# Patient Record
Sex: Male | Born: 1960
Health system: Southern US, Community
[De-identification: ages and names within clinical notes are randomized; demographics above are authoritative.]

## PROBLEM LIST (undated history)

## (undated) DIAGNOSIS — K219 Gastro-esophageal reflux disease without esophagitis: Secondary | ICD-10-CM

## (undated) DIAGNOSIS — F419 Anxiety disorder, unspecified: Secondary | ICD-10-CM

## (undated) DIAGNOSIS — F32A Depression, unspecified: Secondary | ICD-10-CM

## (undated) DIAGNOSIS — E119 Type 2 diabetes mellitus without complications: Secondary | ICD-10-CM

## (undated) DIAGNOSIS — T6701XA Heatstroke and sunstroke, initial encounter: Secondary | ICD-10-CM

## (undated) DIAGNOSIS — K7460 Unspecified cirrhosis of liver: Secondary | ICD-10-CM

## (undated) DIAGNOSIS — I10 Essential (primary) hypertension: Secondary | ICD-10-CM

## (undated) HISTORY — PX: HERNIA REPAIR: SHX51

## (undated) HISTORY — DX: Type 2 diabetes mellitus without complications: E11.9

---

## 2011-03-28 ENCOUNTER — Emergency Department (HOSPITAL_COMMUNITY)
Admission: EM | Admit: 2011-03-28 | Discharge: 2011-03-28 | Disposition: A | Discharge status: Home / Self Care | Payer: Worker's Compensation | Attending: Emergency Medicine | Admitting: Emergency Medicine

## 2011-03-28 DIAGNOSIS — X58XXXA Exposure to other specified factors, initial encounter: Secondary | ICD-10-CM | POA: Insufficient documentation

## 2011-03-28 DIAGNOSIS — S058X9A Other injuries of unspecified eye and orbit, initial encounter: Secondary | ICD-10-CM | POA: Insufficient documentation

## 2011-03-28 DIAGNOSIS — H5710 Ocular pain, unspecified eye: Secondary | ICD-10-CM | POA: Insufficient documentation

## 2011-03-28 DIAGNOSIS — H11419 Vascular abnormalities of conjunctiva, unspecified eye: Secondary | ICD-10-CM | POA: Insufficient documentation

## 2011-03-28 DIAGNOSIS — H53149 Visual discomfort, unspecified: Secondary | ICD-10-CM | POA: Insufficient documentation

## 2011-03-28 DIAGNOSIS — H5789 Other specified disorders of eye and adnexa: Secondary | ICD-10-CM | POA: Insufficient documentation

## 2011-04-26 ENCOUNTER — Encounter: Payer: Self-pay | Admitting: Internal Medicine

## 2012-02-12 ENCOUNTER — Emergency Department (HOSPITAL_COMMUNITY): Payer: 59

## 2012-02-12 ENCOUNTER — Emergency Department (HOSPITAL_COMMUNITY)
Admission: EM | Admit: 2012-02-12 | Discharge: 2012-02-12 | Disposition: A | Discharge status: Home / Self Care | Payer: 59 | Attending: Emergency Medicine | Admitting: Emergency Medicine

## 2012-02-12 ENCOUNTER — Encounter (HOSPITAL_COMMUNITY): Payer: Self-pay

## 2012-02-12 DIAGNOSIS — T675XXA Heat exhaustion, unspecified, initial encounter: Secondary | ICD-10-CM

## 2012-02-12 DIAGNOSIS — R079 Chest pain, unspecified: Secondary | ICD-10-CM | POA: Insufficient documentation

## 2012-02-12 DIAGNOSIS — X30XXXA Exposure to excessive natural heat, initial encounter: Secondary | ICD-10-CM | POA: Insufficient documentation

## 2012-02-12 LAB — CBC WITH DIFFERENTIAL/PLATELET
Basophils Absolute: 0 10*3/uL (ref 0.0–0.1)
Basophils Relative: 0 % (ref 0–1)
Eosinophils Absolute: 0.1 10*3/uL (ref 0.0–0.7)
Eosinophils Relative: 1 % (ref 0–5)
HCT: 39.7 % (ref 39.0–52.0)
Hemoglobin: 14 g/dL (ref 13.0–17.0)
Lymphocytes Relative: 30 % (ref 12–46)
Lymphs Abs: 1.7 10*3/uL (ref 0.7–4.0)
MCH: 35.4 pg — ABNORMAL HIGH (ref 26.0–34.0)
MCHC: 35.3 g/dL (ref 30.0–36.0)
MCV: 100.5 fL — ABNORMAL HIGH (ref 78.0–100.0)
Monocytes Absolute: 0.5 10*3/uL (ref 0.1–1.0)
Monocytes Relative: 9 % (ref 3–12)
Neutro Abs: 3.5 10*3/uL (ref 1.7–7.7)
Neutrophils Relative %: 60 % (ref 43–77)
Platelets: 76 10*3/uL — ABNORMAL LOW (ref 150–400)
RBC: 3.95 MIL/uL — ABNORMAL LOW (ref 4.22–5.81)
RDW: 13 % (ref 11.5–15.5)
WBC: 5.9 10*3/uL (ref 4.0–10.5)

## 2012-02-12 LAB — BASIC METABOLIC PANEL
BUN: 7 mg/dL (ref 6–23)
Calcium: 9.2 mg/dL (ref 8.4–10.5)
GFR calc Af Amer: >90 mL/min (ref >90)
GFR calc non Af Amer: >90 mL/min (ref >90)
Potassium: 3.3 mEq/L — ABNORMAL LOW (ref 3.5–5.1)

## 2012-02-12 LAB — CK: Total CK: 154 U/L (ref 7–232)

## 2012-02-12 MED ORDER — SODIUM CHLORIDE 0.9 % IV BOLUS (SEPSIS)
1000.0000 mL | Freq: Once | INTRAVENOUS | Status: AC
  Administered 2012-02-12: 1000 mL via INTRAVENOUS

## 2012-02-12 MED ORDER — SODIUM CHLORIDE 0.9 % IV SOLN: Freq: Once | INTRAVENOUS | Status: DC

## 2012-02-12 NOTE — ED Notes (Signed)
Patient is comfortable at this time would like something to drink. MD notified.

## 2012-02-12 NOTE — ED Notes (Signed)
RN at bedside

## 2012-02-12 NOTE — ED Notes (Signed)
Patient states he is feeling better and ready to go home

## 2012-02-12 NOTE — ED Notes (Signed)
Patient was given water per MD approval.

## 2012-02-12 NOTE — ED Provider Notes (Signed)
History   This chart was scribed for Joya Gaskins, MD by Gerlean Ren. This patient was seen in room [facility]10/[facility]10 and the patient's care was started at 1:00PM.   CSN: [id number]  Arrival date & time 02/12/12  1207   First MD Initiated Contact with Patient 02/12/12 1300      Chief Complaint  Patient presents with  . Chest Pain     Patient is a 51 y.o. male presenting with chest pain. The history is provided by the patient. No language interpreter was used.  Chest Pain The chest pain began 1 - 2 hours ago. Chest pain occurs constantly. The chest pain is improving. The severity of the pain is mild. The quality of the pain is described as pressure-like. The pain does not radiate. Primary symptoms include dizziness. Pertinent negatives for primary symptoms include no fever, no shortness of breath, no nausea and no vomiting.  Dizziness does not occur with nausea or vomiting.  Pertinent negatives for past medical history include no CHF, no diabetes, no MI and no PE.    Thomas Pratt is a 51 y.o. male who presents to the Emergency Department complaining of gradual onset, gradually improving, constant sub-sternal chest pain described as pressure with associated dizziness after working in heat for 2.5 hours.  Pt reports sudden onset of increased sweating and mild HA, after which he sat down and felt sudden chest pain that has been waxing and waning since. Pt reports being well hydrated and nourished prior to incident. He reports taking 2 Advil with moderate improvement in symptoms. Pt denies LOC and SOB.  Pt denies fever, neck pain, sore throat, visual disturbance, cough, SOB, abdominal pain, nausea, emesis, diarrhea, urinary symptoms, back pain, weakness, numbness and rash as associated symptoms. He does not have a h/o chronic medical conditions. He denies tobacco use but reports using alcohol. No syncope and no mental status changes reported   PMH - none  History reviewed. No pertinent past  surgical history.  No family history on file.  History  Substance Use Topics  . Smoking status: Never Smoker   . Smokeless tobacco: Not on file  . Alcohol Use: Yes     occ      Review of Systems  Constitutional: Negative for fever.  Respiratory: Negative for shortness of breath.   Cardiovascular: Positive for chest pain.  Gastrointestinal: Negative for nausea, vomiting and diarrhea.  Neurological: Positive for dizziness and headaches. Negative for syncope and light-headedness.  All other systems reviewed and are negative.    Allergies  Review of patient's allergies indicates no known allergies.  Home Medications  No current outpatient prescriptions on file.  BP 171/87  Pulse 74  Temp 98.3 F (36.8 C) (Oral)  Resp 20  Ht 5\' 8"  (1.727 m)  Wt 160 lb (72.576 kg)  BMI 24.33 kg/m2  SpO2 100%  Physical Exam  Nursing note and vitals reviewed.  CONSTITUTIONAL: Well developed/well nourished HEAD AND FACE: Normocephalic/atraumatic EYES: EOMI/PERRL ENMT: Mucous membranes moist NECK: supple no meningeal signs CV: S1/S2 noted, no murmurs/rubs/gallops noted LUNGS: Lungs are clear to auscultation bilaterally, no apparent distress ABDOMEN: soft, nontender, no rebound or guarding NEURO: Pt is awake/alert, moves all extremitiesx4 EXTREMITIES: pulses normal, full ROM No calf tenderness. SKIN: warm, color normal PSYCH: no abnormalities of mood noted  ED Course  Procedures  DIAGNOSTIC STUDIES: Oxygen Saturation is 100% on room air, normal by my interpretation.    COORDINATION OF CARE: 1:19PM-Discussed treatment plan, including blood work, with pt  at bedside and pt agreed to plan. Suspect heat exhaustion Low risk for ACS and two negative troponins   Labs Reviewed  CBC WITH DIFFERENTIAL - Abnormal; Notable for the following:    RBC 3.95 (*)     MCV 100.5 (*)     MCH 35.4 (*)     Platelets 76 (*)     All other components within normal limits  BASIC METABOLIC PANEL    TROPONIN I   Dg Chest Portable 1 View  02/12/2012  *RADIOLOGY REPORT*  Clinical Data: 51 year old male chest pain.  PORTABLE CHEST - 1 VIEW  Comparison: None.  Findings: AP portable upright view at 1227 hours.  Somewhat low lung volumes.  Cardiac size and mediastinal contours are within normal limits.  Visualized tracheal air column is within normal limits.  No pneumothorax, pulmonary edema, pleural effusion or confluent pulmonary opacity.  EKG leads and wires overlie the chest.  No acute osseous abnormality identified.  IMPRESSION: No acute cardiopulmonary abnormality.  Original Report Authenticated By: Harley Hallmark, M.D.        MDM  Nursing notes including past medical history and social history reviewed and considered in documentation All labs/vitals reviewed and considered xrays reviewed and considered      Date: 02/12/2012 1214  Rate: 73  Rhythm: normal sinus rhythm  QRS Axis: normal  Intervals: normal  ST/T Wave abnormalities: normal  Conduction Disutrbances:none    Date: 02/12/2012 1457  Rate: 62  Rhythm: normal sinus rhythm  QRS Axis: normal  Intervals: normal  ST/T Wave abnormalities: normal  Conduction Disutrbances:none  Narrative Interpretation:   Old EKG Reviewed: unchanged       I personally performed the services described in this documentation, which was scribed in my presence. The recorded information has been reviewed and considered.           Joya Gaskins, MD 02/12/12 [phone]

## 2012-02-12 NOTE — ED Notes (Signed)
Pt reports was working outside in the heat and got too hot.  Says went inside for about .  Went back outside around 1100am and around 1130 started having sharp/pressure pain in left chest and felt dizzy.  Denies any n/v or SOB

## 2012-09-09 ENCOUNTER — Emergency Department (HOSPITAL_COMMUNITY)
Admission: EM | Admit: 2012-09-09 | Discharge: 2012-09-09 | Disposition: A | Discharge status: Home / Self Care | Payer: 59 | Attending: Emergency Medicine | Admitting: Emergency Medicine

## 2012-09-09 ENCOUNTER — Encounter (HOSPITAL_COMMUNITY): Payer: Self-pay | Admitting: *Deleted

## 2012-09-09 DIAGNOSIS — Y929 Unspecified place or not applicable: Secondary | ICD-10-CM | POA: Insufficient documentation

## 2012-09-09 DIAGNOSIS — W292XXA Contact with other powered household machinery, initial encounter: Secondary | ICD-10-CM | POA: Insufficient documentation

## 2012-09-09 DIAGNOSIS — S61209A Unspecified open wound of unspecified finger without damage to nail, initial encounter: Secondary | ICD-10-CM | POA: Insufficient documentation

## 2012-09-09 DIAGNOSIS — Y9389 Activity, other specified: Secondary | ICD-10-CM | POA: Insufficient documentation

## 2012-09-09 MED ORDER — BACITRACIN ZINC 500 UNIT/GM EX OINT
TOPICAL_OINTMENT | CUTANEOUS | Status: AC
  Administered 2012-09-09: 20:00:00
  Filled 2012-09-09: qty 0.9

## 2012-09-09 MED ORDER — NAPROXEN 500 MG PO TABS: 500.0000 mg | ORAL_TABLET | Freq: Two times a day (BID) | ORAL | Status: DC

## 2012-09-09 MED ORDER — LIDOCAINE HCL (PF) 2 % IJ SOLN
2.0000 mL | Freq: Once | INTRAMUSCULAR | Status: AC
  Administered 2012-09-09: 2 mL
  Filled 2012-09-09: qty 10

## 2012-09-09 NOTE — ED Notes (Signed)
Pt presents with left index finger laceration obtained at work, secondary to a grinder usage. Bleeding controlled at this time. Pt reports being up to date with all immunizations

## 2012-09-09 NOTE — ED Notes (Signed)
Laceration to left index finger. Cut with a grinder. Bleeding controlled. Tetanus is UTD.

## 2012-09-09 NOTE — ED Provider Notes (Signed)
History     CSN: [id number]  Arrival date & time 09/09/12  1643   First MD Initiated Contact with Patient 09/09/12 1854      Chief Complaint  Patient presents with  . Laceration    (Consider location/radiation/quality/duration/timing/severity/associated sxs/prior treatment) Patient is a 52 y.o. male presenting with skin laceration. The history is provided by the patient.  Laceration Location:  Hand Hand laceration location:  L finger Length (cm):  1 Depth:  Through dermis Quality: straight   Bleeding: controlled   Time since incident:  2 hours Laceration mechanism:  Metal edge Pain details:    Quality:  Throbbing   Severity:  Mild   Timing:  Constant   Progression:  Unchanged Foreign body present:  No foreign bodies Relieved by:  Pressure Worsened by:  Movement Tetanus status:  Up to date   History reviewed. No pertinent past medical history.  History reviewed. No pertinent past surgical history.  No family history on file.  History  Substance Use Topics  . Smoking status: Never Smoker   . Smokeless tobacco: Not on file  . Alcohol Use: Yes     Comment: occ      Review of Systems  Constitutional: Negative for fever and chills.  HENT: Negative for facial swelling.   Respiratory: Negative for shortness of breath and wheezing.   Skin: Positive for wound.  Neurological: Negative for weakness and numbness.    Allergies  Review of patient's allergies indicates no known allergies.  Home Medications   Current Outpatient Rx  Name  Route  Sig  Dispense  Refill  . naproxen (NAPROSYN) 500 MG tablet   Oral   Take 1 tablet (500 mg total) by mouth 2 (two) times daily.   12 tablet   0     BP 168/99  Pulse 70  Temp(Src) 98.1 F (36.7 C) (Oral)  SpO2 96%  Physical Exam  Constitutional: He is oriented to person, place, and time. He appears well-developed and well-nourished.  HENT:  Head: Normocephalic.  Cardiovascular: Normal rate.   Pulmonary/Chest:  Effort normal.  Neurological: He is alert and oriented to person, place, and time. No sensory deficit.  Skin: Skin is warm and dry. Laceration noted.  Linear laceration, 1 cm through left index finger over pip joint,  No joint space involvement,  Base of wound easily visualized. Distal sensation intact.  Pt displays full range of motion of index finger with no deficiency, he does have increased pain when he stretches the PIP joint.    ED Course  Procedures (including critical care time)   LACERATION REPAIR Performed by: Burgess Amor Authorized by: Burgess Amor Consent: Verbal consent obtained. Risks and benefits: risks, benefits and alternatives were discussed Consent given by: patient Patient identity confirmed: provided demographic data Prepped and Draped in normal sterile fashion Wound explored  Laceration Location:  Left index finger  Laceration Length: 1 cm  No Foreign Bodies seen or palpated  Anesthesia: local infiltration  Local anesthetic: lidocaine 2 % without epinephrine  Anesthetic total: 1.5 ml  Irrigation method: syringe Amount of cleaning: standard  Skin closure: Ethilon 4.0   Number of sutures: 2   Technique: Simple interrupted   Patient tolerance: Patient tolerated the procedure well with no immediate complications.   Labs Reviewed - No data to display No results found.   1. Laceration of finger of left hand, initial encounter       MDM  Dressing applied along with finger splint to keep the joint  in extension for the next one to 2 days.  Encouraged to keep the area clean and dry.  Suture removal in 10 days, recheck sooner for any signs of infection.  Patient is up-to-date on his tetanus.  He was prescribed Naprosyn if needed for pain when digital block resolves.  Ice and elevation also recommended.        Burgess Amor, [place] 09/09/12 1949

## 2012-09-10 NOTE — ED Provider Notes (Signed)
Medical screening examination/treatment/procedure(s) were performed by non-physician practitioner and as supervising physician I was immediately available for consultation/collaboration. Devoria Albe, MD, FACEP   Ward Givens, MD 09/10/12 [phone]

## 2013-02-27 DIAGNOSIS — R079 Chest pain, unspecified: Secondary | ICD-10-CM

## 2014-05-28 ENCOUNTER — Emergency Department (HOSPITAL_COMMUNITY): Payer: 59

## 2014-05-28 ENCOUNTER — Emergency Department (HOSPITAL_COMMUNITY)
Admission: EM | Admit: 2014-05-28 | Discharge: 2014-05-28 | Disposition: A | Discharge status: Home / Self Care | Payer: 59 | Attending: Emergency Medicine | Admitting: Emergency Medicine

## 2014-05-28 ENCOUNTER — Encounter (HOSPITAL_COMMUNITY): Payer: Self-pay | Admitting: Oncology

## 2014-05-28 DIAGNOSIS — I10 Essential (primary) hypertension: Secondary | ICD-10-CM | POA: Diagnosis not present

## 2014-05-28 DIAGNOSIS — Z791 Long term (current) use of non-steroidal anti-inflammatories (NSAID): Secondary | ICD-10-CM | POA: Diagnosis not present

## 2014-05-28 DIAGNOSIS — Y9389 Activity, other specified: Secondary | ICD-10-CM | POA: Insufficient documentation

## 2014-05-28 DIAGNOSIS — S42302A Unspecified fracture of shaft of humerus, left arm, initial encounter for closed fracture: Secondary | ICD-10-CM

## 2014-05-28 DIAGNOSIS — W010XXA Fall on same level from slipping, tripping and stumbling without subsequent striking against object, initial encounter: Secondary | ICD-10-CM | POA: Diagnosis not present

## 2014-05-28 DIAGNOSIS — S42352A Displaced comminuted fracture of shaft of humerus, left arm, initial encounter for closed fracture: Secondary | ICD-10-CM | POA: Insufficient documentation

## 2014-05-28 DIAGNOSIS — S6992XA Unspecified injury of left wrist, hand and finger(s), initial encounter: Secondary | ICD-10-CM | POA: Diagnosis present

## 2014-05-28 DIAGNOSIS — Y9289 Other specified places as the place of occurrence of the external cause: Secondary | ICD-10-CM | POA: Diagnosis not present

## 2014-05-28 DIAGNOSIS — W19XXXA Unspecified fall, initial encounter: Secondary | ICD-10-CM

## 2014-05-28 DIAGNOSIS — M79602 Pain in left arm: Secondary | ICD-10-CM

## 2014-05-28 HISTORY — DX: Essential (primary) hypertension: I10

## 2014-05-28 LAB — CBC
HEMATOCRIT: 39.9 % (ref 39.0–52.0)
HEMOGLOBIN: 13.7 g/dL (ref 13.0–17.0)
MCH: 34.9 pg — ABNORMAL HIGH (ref 26.0–34.0)
MCHC: 34.3 g/dL (ref 30.0–36.0)
MCV: 101.5 fL — ABNORMAL HIGH (ref 78.0–100.0)
Platelets: 197 10*3/uL (ref 150–400)
RBC: 3.93 MIL/uL — AB (ref 4.22–5.81)
RDW: 12.7 % (ref 11.5–15.5)
WBC: 5.9 10*3/uL (ref 4.0–10.5)

## 2014-05-28 LAB — I-STAT CHEM 8, ED
BUN: 6 mg/dL (ref 6–23)
CHLORIDE: 99 meq/L (ref 96–112)
Calcium, Ion: 1.1 mmol/L — ABNORMAL LOW (ref 1.12–1.23)
Creatinine, Ser: 1.3 mg/dL (ref 0.50–1.35)
Glucose, Bld: 165 mg/dL — ABNORMAL HIGH (ref 70–99)
HEMATOCRIT: 47 % (ref 39.0–52.0)
HEMOGLOBIN: 16 g/dL (ref 13.0–17.0)
POTASSIUM: 3.7 meq/L (ref 3.7–5.3)
SODIUM: 136 meq/L — AB (ref 137–147)
TCO2: 22 mmol/L (ref 0–100)

## 2014-05-28 LAB — ETHANOL: ALCOHOL ETHYL (B): 283 mg/dL — AB (ref 0–11)

## 2014-05-28 MED ORDER — OXYCODONE-ACETAMINOPHEN 5-325 MG PO TABS
2.0000 | ORAL_TABLET | Freq: Once | ORAL | Status: AC
  Administered 2014-05-28: 2 via ORAL
  Filled 2014-05-28: qty 2

## 2014-05-28 MED ORDER — NAPROXEN 500 MG PO TABS: 500.0000 mg | ORAL_TABLET | Freq: Two times a day (BID) | ORAL | Status: DC | PRN

## 2014-05-28 MED ORDER — SODIUM CHLORIDE 0.9 % IV BOLUS (SEPSIS)
1000.0000 mL | Freq: Once | INTRAVENOUS | Status: AC
  Administered 2014-05-28: 1000 mL via INTRAVENOUS

## 2014-05-28 MED ORDER — FENTANYL CITRATE 0.05 MG/ML IJ SOLN
100.0000 ug | Freq: Once | INTRAMUSCULAR | Status: AC
  Administered 2014-05-28: 100 ug via INTRAVENOUS
  Filled 2014-05-28: qty 2

## 2014-05-28 MED ORDER — HYDROMORPHONE HCL 1 MG/ML IJ SOLN
1.0000 mg | Freq: Once | INTRAMUSCULAR | Status: AC
  Administered 2014-05-28: 1 mg via INTRAVENOUS
  Filled 2014-05-28: qty 1

## 2014-05-28 MED ORDER — OXYCODONE-ACETAMINOPHEN 5-325 MG PO TABS: 1.0000 | ORAL_TABLET | ORAL | Status: DC | PRN

## 2014-05-28 NOTE — ED Notes (Signed)
Per EMS pt was at [employer] w/ ETOH on board playing the punching game when he fell injuring his left shoulder.  Obvious deformity, crepitus, swelling and pain.  Pt given 100 mcg of fentanyl en route.

## 2014-05-28 NOTE — Discharge Instructions (Signed)
Please follow up with your primary care physician in 1-2 days. If you do not have one please call the [facility] number listed above. Please follow up with Dr. Tamera Punt to schedule a follow up appointment.  Please take pain medication and/or muscle relaxants as prescribed and as needed for pain. Please do not drive on narcotic pain medication or on muscle relaxants. Please read all discharge instructions and return precautions.   Humerus Fracture, Treated with Immobilization The humerus is the large bone in your upper arm. You have a broken (fractured) humerus. These fractures are easily diagnosed with X-rays. TREATMENT  Simple fractures which will heal without disability are treated with simple immobilization. Immobilization means you will wear a cast, splint, or sling. You have a fracture which will do well with immobilization. The fracture will heal well simply by being held in a good position until it is stable enough to begin range of motion exercises. Do not take part in activities which would further injure your arm.  HOME CARE INSTRUCTIONS   Put ice on the injured area.  Put ice in a plastic bag.  Place a towel between your skin and the bag.  Leave the ice on for 15-20 minutes, 03-04 times a day.  If you have a cast:  Do not scratch the skin under the cast using sharp or pointed objects.  Check the skin around the cast every day. You may put lotion on any red or sore areas.  Keep your cast dry and clean.  If you have a splint:  Wear the splint as directed.  Keep your splint dry and clean.  You may loosen the elastic around the splint if your fingers become numb, tingle, or turn cold or blue.  If you have a sling:  Wear the sling as directed.  Do not put pressure on any part of your cast or splint until it is fully hardened.  Your cast or splint can be protected during bathing with a plastic bag. Do not lower the cast or splint into water.  Only  take over-the-counter or prescription medicines for pain, discomfort, or fever as directed by your caregiver.  Do range of motion exercises as instructed by your caregiver.  Follow up as directed by your caregiver. This is very important in order to avoid permanent injury or disability and chronic pain. SEEK IMMEDIATE MEDICAL CARE IF:   Your skin or nails in the injured arm turn blue or gray.  Your arm feels cold or numb.  You develop severe pain in the injured arm.  You are having problems with the medicines you were given. MAKE SURE YOU:   Understand these instructions.  Will watch your condition.  Will get help right away if you are not doing well or get worse. Document Released: 10/14/2000 Document Revised: 09/30/2011 Document Reviewed: 08/22/2010 [facility] Patient Information 2015 [place], [place]. This information is not intended to replace advice given to you by your health care provider. Make sure you discuss any questions you have with your health care provider.    Cast or Splint Care Casts and splints support injured limbs and keep bones from moving while they heal. It is important to care for your cast or splint at home.  HOME CARE INSTRUCTIONS  Keep the cast or splint uncovered during the drying period. It can take 24 to 48 hours to dry if it is made of plaster. A fiberglass cast will dry in less than 1 hour.  Do not  rest the cast on anything harder than a pillow for the first 24 hours.  Do not put weight on your injured limb or apply pressure to the cast until your health care provider gives you permission.  Keep the cast or splint dry. Wet casts or splints can lose their shape and may not support the limb as well. A wet cast that has lost its shape can also create harmful pressure on your skin when it dries. Also, wet skin can become infected.  Cover the cast or splint with a plastic bag when bathing or when out in the rain or snow. If the cast is on the trunk of  the body, take sponge baths until the cast is removed.  If your cast does become wet, dry it with a towel or a blow dryer on the cool setting only.  Keep your cast or splint clean. Soiled casts may be wiped with a moistened cloth.  Do not place any hard or soft foreign objects under your cast or splint, such as cotton, toilet paper, lotion, or powder.  Do not try to scratch the skin under the cast with any object. The object could get stuck inside the cast. Also, scratching could lead to an infection. If itching is a problem, use a blow dryer on a cool setting to relieve discomfort.  Do not trim or cut your cast or remove padding from inside of it.  Exercise all joints next to the injury that are not immobilized by the cast or splint. For example, if you have a long leg cast, exercise the hip joint and toes. If you have an arm cast or splint, exercise the shoulder, elbow, thumb, and fingers.  Elevate your injured arm or leg on 1 or 2 pillows for the first 1 to 3 days to decrease swelling and pain.It is best if you can comfortably elevate your cast so it is higher than your heart. SEEK MEDICAL CARE IF:   Your cast or splint cracks.  Your cast or splint is too tight or too loose.  You have unbearable itching inside the cast.  Your cast becomes wet or develops a soft spot or area.  You have a bad smell coming from inside your cast.  You get an object stuck under your cast.  Your skin around the cast becomes red or raw.  You have new pain or worsening pain after the cast has been applied. SEEK IMMEDIATE MEDICAL CARE IF:   You have fluid leaking through the cast.  You are unable to move your fingers or toes.  You have discolored (blue or white), cool, painful, or very swollen fingers or toes beyond the cast.  You have tingling or numbness around the injured area.  You have severe pain or pressure under the cast.  You have any difficulty with your breathing or have shortness of  breath.  You have chest pain. Document Released: 07/05/2000 Document Revised: 04/28/2013 Document Reviewed: 01/14/2013 [facility] Patient Information 2015 [place], [place]. This information is not intended to replace advice given to you by your health care provider. Make sure you discuss any questions you have with your health care provider.

## 2014-05-28 NOTE — ED Provider Notes (Signed)
CSN: [id number]     Arrival date & time 05/28/14  0020 History   First MD Initiated Contact with Patient 05/28/14 0031     Chief Complaint  Patient presents with  . Shoulder Pain     (Consider location/radiation/quality/duration/timing/severity/associated sxs/prior Treatment) HPI Comments: Patient is a 53 year old male past medical history significant for hypertension presenting to the emergency department for left arm pain. Patient states he was at who appears playing the "punching bag game" when he tripped and fell injuring his left shoulder. He states he fell onto an outstretched left arm. Denies hitting his head or loss of consciousness. A friend is with him and witnessed the event, denies that he did hit his head or have any loss of consciousness. Patient states he has severe pain radiating from shoulder to his elbow. No history of previous injuries to his left arm. He is right-hand dominant. He does endorse drinking alcohol this evening.  Patient is a 53 y.o. male presenting with shoulder pain.  Shoulder Pain   Past Medical History  Diagnosis Date  . Hypertension    History reviewed. No pertinent past surgical history. History reviewed. No pertinent family history. History  Substance Use Topics  . Smoking status: Never Smoker   . Smokeless tobacco: Not on file  . Alcohol Use: Yes     Comment: occ    Review of Systems  Musculoskeletal: Positive for myalgias and arthralgias.  All other systems reviewed and are negative.     Allergies  Review of patient's allergies indicates no known allergies.  Home Medications   Prior to Admission medications   Medication Sig Start Date End Date Taking? Authorizing Provider  naproxen (NAPROSYN) 500 MG tablet Take 1 tablet (500 mg total) by mouth 2 (two) times daily. 09/09/12   Evalee Jefferson, PA-C  naproxen (NAPROSYN) 500 MG tablet Take 1 tablet (500 mg total) by mouth 2 (two) times daily as needed for mild pain or moderate pain. 05/28/14    Stefana Lodico L Daviel Allegretto, PA-C  oxyCODONE-acetaminophen (PERCOCET/ROXICET) 5-325 MG per tablet Take 1 tablet by mouth every 4 (four) hours as needed for severe pain. May take 2 tablets PO q 6 hours for severe pain - Do not take with Tylenol as this tablet already contains tylenol 05/28/14   Antwonette Feliz L Mallarie Voorhies, PA-C   BP 105/71 mmHg  Pulse 106  Temp(Src) 98.5 F (36.9 C) (Oral)  Resp 18  Ht 5\' 8"  (1.727 m)  Wt 160 lb (72.576 kg)  BMI 24.33 kg/m2  SpO2 93% Physical Exam  Constitutional: He is oriented to person, place, and time. He appears well-developed and well-nourished. No distress.  HENT:  Head: Normocephalic and atraumatic.  Right Ear: External ear normal.  Left Ear: External ear normal.  Nose: Nose normal.  Mouth/Throat: Oropharynx is clear and moist.  Eyes: Conjunctivae are normal.  Neck: Normal range of motion. Neck supple.  Cardiovascular: Normal rate, regular rhythm, normal heart sounds and intact distal pulses.   Pulmonary/Chest: Effort normal and breath sounds normal. No respiratory distress.  Abdominal: Soft.  Musculoskeletal:       Right shoulder: Normal.       Left shoulder: Normal.       Right elbow: Normal.      Left elbow: Normal.       Right wrist: Normal.       Left wrist: Normal.       Right upper arm: Normal.       Left upper arm: He exhibits tenderness, swelling  and deformity.       Right forearm: Normal.       Left forearm: Normal.       Right hand: Normal.       Left hand: Normal.  Neurological: He is alert and oriented to person, place, and time.  Skin: Skin is warm and dry. He is not diaphoretic.  Psychiatric: He has a normal mood and affect. His speech is slurred.  Nursing note and vitals reviewed.   ED Course  Procedures (including critical care time) Medications  HYDROmorphone (DILAUDID) injection 1 mg (1 mg Intravenous Given 05/28/14 0115)  sodium chloride 0.9 % bolus 1,000 mL (0 mLs Intravenous Stopped 05/28/14 0202)  fentaNYL  (SUBLIMAZE) injection 100 mcg (100 mcg Intravenous Given 05/28/14 0232)  oxyCODONE-acetaminophen (PERCOCET/ROXICET) 5-325 MG per tablet 2 tablet (2 tablets Oral Given 05/28/14 0336)    Labs Review Labs Reviewed  CBC - Abnormal; Notable for the following:    RBC 3.93 (*)    MCV 101.5 (*)    MCH 34.9 (*)    All other components within normal limits  ETHANOL - Abnormal; Notable for the following:    Alcohol, Ethyl (B) 283 (*)    All other components within normal limits  I-STAT CHEM 8, ED - Abnormal; Notable for the following:    Sodium 136 (*)    Glucose, Bld 165 (*)    Calcium, Ion 1.10 (*)    All other components within normal limits    Imaging Review Dg Chest 1 View  05/28/2014   CLINICAL DATA:  Fall, shoulder pain  EXAM: CHEST - 1 VIEW  COMPARISON:  None.  FINDINGS: There is a fracture of the proximal left humeral shaft which extends from the diaphysis into the metaphysis. The fracture is comminuted and incompletely imaged. No evidence of dislocation on frontal view. No pulmonary edema. No pneumothorax.  IMPRESSION: 1. Comminuted fracture of the left humeral shaft. 2. No acute cardiopulmonary process.   Electronically Signed   By: Suzy Bouchard M.D.   On: 05/28/2014 01:08   Dg Humerus Left  05/28/2014   CLINICAL DATA:  Fall, left shoulder pain  EXAM: LEFT HUMERUS - 2+ VIEW  COMPARISON:  None.  FINDINGS: The comminuted fracture of the proximal midshaft left humerus. The fracture extends proximally into the proximal metaphysis. There is a bayonet fragment extending laterally. The distal fracture fragment is angulated medially. There is a 2.5 cm of override.  IMPRESSION: 1. Comminuted fracture of the proximal midshaft of the left humerus. 2. No evidence of dislocation.   Electronically Signed   By: Suzy Bouchard M.D.   On: 05/28/2014 01:10     EKG Interpretation None      Discussed patient case with Dr. Tamera Punt who Recommends sling immobilizer, coaptation splint and follow-up in  his office on Monday or Wednesday for repeat x-rays.  SPLINT APPLICATION Date/Time: 8:24 AM Authorized by: Harlow Mares Consent: Verbal consent obtained. Risks and benefits: risks, benefits and alternatives were discussed Consent given by: patient Splint applied by: orthopedic technician Location details: left arm Splint type: coaptation splint Supplies used: ortho glass Post-procedure: The splinted body part was neurovascularly unchanged following the procedure. Patient tolerance: Patient tolerated the procedure well with no immediate complications.    MDM   Final diagnoses:  Left arm pain  Fall  Fracture of left humerus, closed, initial encounter    Filed Vitals:   05/28/14 0339  BP: 105/71  Pulse: 106  Temp: 98.5 F (36.9 C)  Resp: 18   Afebrile, NAD, non-toxic appearing, AAOx4. I have reviewed nursing notes, vital signs, and all appropriate lab and imaging results for this patient. Neurovascularly intact. Normal sensation. No evidence of compartment syndrome. Patient with closed comminuted left humeral fracture with displacement. Discussed patient case with Dr. Tamera Punt recommends sling immobilizer with coaptation splint with follow-up in his office either on Monday or Wednesday for repeat x-rays. Pain management emergency department. Return precautions discussed. Patient is agreeable to plan. Patient is stable at time of discharge. Patient d/w with Dr. Claudine Mouton, agrees with plan.          Harlow Mares, PA-C 05/28/14 [id number]  Everlene Balls, MD 05/28/14 1425

## 2014-05-28 NOTE — ED Notes (Signed)
Bed: JM42 Expected date:  Expected time:  Means of arrival:  Comments: EMS 53yo M fall at [facility], shoulder pain

## 2016-05-21 ENCOUNTER — Inpatient Hospital Stay (HOSPITAL_COMMUNITY): Payer: 59

## 2016-05-21 ENCOUNTER — Encounter (HOSPITAL_COMMUNITY): Payer: Self-pay | Admitting: Emergency Medicine

## 2016-05-21 ENCOUNTER — Inpatient Hospital Stay (HOSPITAL_COMMUNITY)
Admission: EM | Admit: 2016-05-21 | Discharge: 2016-06-01 | DRG: 433 | Disposition: A | Discharge status: Home / Self Care | Payer: 59 | Attending: Family Medicine | Admitting: Family Medicine

## 2016-05-21 ENCOUNTER — Emergency Department (HOSPITAL_COMMUNITY): Payer: 59

## 2016-05-21 DIAGNOSIS — E8770 Fluid overload, unspecified: Secondary | ICD-10-CM | POA: Diagnosis present

## 2016-05-21 DIAGNOSIS — D638 Anemia in other chronic diseases classified elsewhere: Secondary | ICD-10-CM | POA: Diagnosis present

## 2016-05-21 DIAGNOSIS — Z6822 Body mass index (BMI) 22.0-22.9, adult: Secondary | ICD-10-CM

## 2016-05-21 DIAGNOSIS — K7011 Alcoholic hepatitis with ascites: Secondary | ICD-10-CM | POA: Diagnosis present

## 2016-05-21 DIAGNOSIS — D539 Nutritional anemia, unspecified: Secondary | ICD-10-CM | POA: Diagnosis present

## 2016-05-21 DIAGNOSIS — K7040 Alcoholic hepatic failure without coma: Secondary | ICD-10-CM | POA: Diagnosis present

## 2016-05-21 DIAGNOSIS — R188 Other ascites: Secondary | ICD-10-CM | POA: Diagnosis not present

## 2016-05-21 DIAGNOSIS — K766 Portal hypertension: Secondary | ICD-10-CM | POA: Diagnosis present

## 2016-05-21 DIAGNOSIS — R600 Localized edema: Secondary | ICD-10-CM | POA: Diagnosis not present

## 2016-05-21 DIAGNOSIS — K7031 Alcoholic cirrhosis of liver with ascites: Principal | ICD-10-CM

## 2016-05-21 DIAGNOSIS — D72829 Elevated white blood cell count, unspecified: Secondary | ICD-10-CM | POA: Diagnosis present

## 2016-05-21 DIAGNOSIS — E8809 Other disorders of plasma-protein metabolism, not elsewhere classified: Secondary | ICD-10-CM | POA: Diagnosis present

## 2016-05-21 DIAGNOSIS — E871 Hypo-osmolality and hyponatremia: Secondary | ICD-10-CM | POA: Diagnosis present

## 2016-05-21 DIAGNOSIS — E440 Moderate protein-calorie malnutrition: Secondary | ICD-10-CM | POA: Diagnosis present

## 2016-05-21 DIAGNOSIS — Z79899 Other long term (current) drug therapy: Secondary | ICD-10-CM | POA: Diagnosis not present

## 2016-05-21 DIAGNOSIS — E876 Hypokalemia: Secondary | ICD-10-CM | POA: Diagnosis present

## 2016-05-21 DIAGNOSIS — K7460 Unspecified cirrhosis of liver: Secondary | ICD-10-CM | POA: Diagnosis present

## 2016-05-21 DIAGNOSIS — I10 Essential (primary) hypertension: Secondary | ICD-10-CM | POA: Diagnosis present

## 2016-05-21 DIAGNOSIS — E861 Hypovolemia: Secondary | ICD-10-CM | POA: Diagnosis present

## 2016-05-21 DIAGNOSIS — K7030 Alcoholic cirrhosis of liver without ascites: Secondary | ICD-10-CM | POA: Diagnosis present

## 2016-05-21 DIAGNOSIS — R0602 Shortness of breath: Secondary | ICD-10-CM | POA: Diagnosis present

## 2016-05-21 DIAGNOSIS — F1010 Alcohol abuse, uncomplicated: Secondary | ICD-10-CM | POA: Diagnosis present

## 2016-05-21 HISTORY — DX: Unspecified cirrhosis of liver: K74.60

## 2016-05-21 LAB — AMMONIA: Ammonia: 49 umol/L — ABNORMAL HIGH (ref 9–35)

## 2016-05-21 LAB — COMPREHENSIVE METABOLIC PANEL
ALBUMIN: 2.2 g/dL — AB (ref 3.5–5.0)
ALT: 20 U/L (ref 17–63)
ANION GAP: 11 (ref 5–15)
AST: 56 U/L — AB (ref 15–41)
Alkaline Phosphatase: 78 U/L (ref 38–126)
BUN: 17 mg/dL (ref 6–20)
CO2: 35 mmol/L — AB (ref 22–32)
Calcium: 8.5 mg/dL — ABNORMAL LOW (ref 8.9–10.3)
Chloride: 80 mmol/L — ABNORMAL LOW (ref 101–111)
Creatinine, Ser: 0.7 mg/dL (ref 0.61–1.24)
GFR calc Af Amer: >60 mL/min (ref >60)
GFR calc non Af Amer: >60 mL/min (ref >60)
GLUCOSE: 184 mg/dL — AB (ref 65–99)
POTASSIUM: 2.6 mmol/L — AB (ref 3.5–5.1)
SODIUM: 126 mmol/L — AB (ref 135–145)
Total Bilirubin: 3.4 mg/dL — ABNORMAL HIGH (ref 0.3–1.2)
Total Protein: 8.2 g/dL — ABNORMAL HIGH (ref 6.5–8.1)

## 2016-05-21 LAB — CBC WITH DIFFERENTIAL/PLATELET
BASOS ABS: 0 10*3/uL (ref 0.0–0.1)
BASOS PCT: 0 %
EOS ABS: 0 10*3/uL (ref 0.0–0.7)
Eosinophils Relative: 0 %
HCT: 34.6 % — ABNORMAL LOW (ref 39.0–52.0)
HEMOGLOBIN: 11.6 g/dL — AB (ref 13.0–17.0)
Lymphocytes Relative: 10 %
Lymphs Abs: 1.1 10*3/uL (ref 0.7–4.0)
MCH: 34.7 pg — ABNORMAL HIGH (ref 26.0–34.0)
MCHC: 33.5 g/dL (ref 30.0–36.0)
MCV: 103.6 fL — ABNORMAL HIGH (ref 78.0–100.0)
MONO ABS: 0.7 10*3/uL (ref 0.1–1.0)
Monocytes Relative: 7 %
NEUTROS PCT: 83 %
Neutro Abs: 9.2 10*3/uL — ABNORMAL HIGH (ref 1.7–7.7)
Platelets: 266 10*3/uL (ref 150–400)
RBC: 3.34 MIL/uL — ABNORMAL LOW (ref 4.22–5.81)
RDW: 14.9 % (ref 11.5–15.5)
WBC: 11.1 10*3/uL — ABNORMAL HIGH (ref 4.0–10.5)

## 2016-05-21 LAB — PROTIME-INR
INR: 1.4
Prothrombin Time: 17.3 seconds — ABNORMAL HIGH (ref 11.4–15.2)

## 2016-05-21 LAB — MAGNESIUM: MAGNESIUM: 1.8 mg/dL (ref 1.7–2.4)

## 2016-05-21 LAB — APTT: APTT: 34 s (ref 24–36)

## 2016-05-21 LAB — OSMOLALITY: OSMOLALITY: 284 mosm/kg (ref 275–295)

## 2016-05-21 LAB — OSMOLALITY, URINE: Osmolality, Ur: 611 mOsm/kg (ref 300–900)

## 2016-05-21 LAB — LIPASE, BLOOD: Lipase: 39 U/L (ref 11–51)

## 2016-05-21 LAB — BRAIN NATRIURETIC PEPTIDE: B NATRIURETIC PEPTIDE 5: 36 pg/mL (ref 0.0–100.0)

## 2016-05-21 MED ORDER — POTASSIUM CHLORIDE CRYS ER 20 MEQ PO TBCR
40.0000 meq | EXTENDED_RELEASE_TABLET | Freq: Once | ORAL | Status: AC
  Administered 2016-05-21: 40 meq via ORAL
  Filled 2016-05-21: qty 2

## 2016-05-21 MED ORDER — HEPARIN SODIUM (PORCINE) 5000 UNIT/ML IJ SOLN
5000.0000 [IU] | Freq: Three times a day (TID) | INTRAMUSCULAR | Status: DC
  Administered 2016-05-21 – 2016-06-01 (×30): 5000 [IU] via SUBCUTANEOUS
  Filled 2016-05-21 (×31): qty 1

## 2016-05-21 MED ORDER — POTASSIUM CHLORIDE 10 MEQ/100ML IV SOLN
10.0000 meq | INTRAVENOUS | Status: AC
  Administered 2016-05-21 (×3): 10 meq via INTRAVENOUS
  Filled 2016-05-21 (×3): qty 100

## 2016-05-21 MED ORDER — ALBUMIN HUMAN 25 % IV SOLN
25.0000 g | INTRAVENOUS | Status: AC
  Administered 2016-05-22: 25 g via INTRAVENOUS
  Filled 2016-05-21: qty 100

## 2016-05-21 MED ORDER — IOPAMIDOL (ISOVUE-300) INJECTION 61%
INTRAVENOUS | Status: AC
  Administered 2016-05-21: 16:00:00
  Filled 2016-05-21: qty 30

## 2016-05-21 MED ORDER — SODIUM CHLORIDE 0.9 % IV SOLN
Freq: Once | INTRAVENOUS | Status: AC
  Administered 2016-05-21: 12:00:00 via INTRAVENOUS

## 2016-05-21 MED ORDER — IOPAMIDOL (ISOVUE-300) INJECTION 61%
100.0000 mL | Freq: Once | INTRAVENOUS | Status: AC | PRN
  Administered 2016-05-21: 100 mL via INTRAVENOUS

## 2016-05-21 MED ORDER — SODIUM CHLORIDE 0.9% FLUSH
3.0000 mL | Freq: Two times a day (BID) | INTRAVENOUS | Status: DC
  Administered 2016-05-21 – 2016-06-01 (×23): 3 mL via INTRAVENOUS

## 2016-05-21 MED ORDER — FUROSEMIDE 10 MG/ML IJ SOLN
40.0000 mg | Freq: Two times a day (BID) | INTRAMUSCULAR | Status: DC
  Administered 2016-05-21 – 2016-05-24 (×7): 40 mg via INTRAVENOUS
  Filled 2016-05-21 (×7): qty 4

## 2016-05-21 MED ORDER — SPIRONOLACTONE 25 MG PO TABS
50.0000 mg | ORAL_TABLET | Freq: Every day | ORAL | Status: DC
  Administered 2016-05-21 – 2016-05-25 (×5): 50 mg via ORAL
  Filled 2016-05-21 (×5): qty 2

## 2016-05-21 MED ORDER — ENSURE ENLIVE PO LIQD
237.0000 mL | Freq: Two times a day (BID) | ORAL | Status: DC
  Administered 2016-05-21 – 2016-06-01 (×20): 237 mL via ORAL

## 2016-05-21 NOTE — ED Provider Notes (Signed)
[facility] DEPT Provider Note   CSN: [id number] Arrival date & time: 05/21/16  [id number]  By signing my name below, I, Rayna Sexton, attest that this documentation has been prepared under the direction and in the presence of Forde Dandy, MD. Electronically Signed: Rayna Sexton, ED Scribe. 05/21/16. 9:10 AM.   History   Chief Complaint Chief Complaint  Patient presents with  . Shortness of Breath   HPI HPI Comments: Thomas Pratt is a 55 y.o. male who presents to the Emergency Department complaining of worsening, severe, abdominal distension x 1 month. Pt states he woke this morning feeling lightheaded noting that he "feels like he has water inside" and is pointing at his right ear. He reports his worsening, severe, abdominal distension as well as moderate bilateral lower extremity swelling, recent weight loss and SOB with exertion. He states his swelling began this morning. His co-worker immediately noted that his abdominal distension has been present for the past month and confirms that he has noticed he experienced significant weight loss. Pt states he is taking a medication for an infection which was rx by a doctor in [place] but does not provide any more details. He denies a h/o liver issues but notes he was drinking copious amounts of ETOH regularly until about four weeks ago. He denies a h/o cardiac issues. He denies CP, fevers, chills, nocturnal hyperhidrosis, urinary changes, abdominal pain, nausea, vomiting, diarrhea, bloody stools and melanotic stools.   The history is provided by the patient and a friend. A language interpreter was used (spanish).    Past Medical History:  Diagnosis Date  . Hypertension     There are no active problems to display for this patient.   History reviewed. No pertinent surgical history.   Home Medications    Prior to Admission medications   Medication Sig Start Date End Date Taking? Authorizing Provider  furosemide (LASIX) 40 MG  tablet Take 40 mg by mouth daily. 05/14/16  Yes Historical Provider, MD  spironolactone (ALDACTONE) 50 MG tablet Take 50 mg by mouth daily. 05/14/16  Yes Historical Provider, MD    Family History History reviewed. No pertinent family history.  Social History Social History  Substance Use Topics  . Smoking status: Never Smoker  . Smokeless tobacco: Not on file  . Alcohol use Yes     Comment: was daily drinker     Allergies   Review of patient's allergies indicates no known allergies.   Review of Systems Review of Systems  Constitutional: Positive for unexpected weight change. Negative for chills, diaphoresis and fever.  Respiratory: Positive for shortness of breath.   Cardiovascular: Positive for leg swelling. Negative for chest pain.  Gastrointestinal: Positive for abdominal distention. Negative for abdominal pain, anal bleeding, blood in stool, diarrhea, nausea and vomiting.  Genitourinary: Negative for difficulty urinating.  Neurological: Positive for light-headedness.  All other systems reviewed and are negative.  Physical Exam Updated Vital Signs BP 157/93   Pulse 95   Temp 98 F (36.7 C) (Oral)   Resp 17   Ht 5\' 5"  (1.651 m)   Wt 160 lb (72.6 kg)   SpO2 97%   BMI 26.63 kg/m   Physical Exam Nursing note and vitals reviewed. Constitutional: chronically ill appearing and malnourished appearing, non-toxic, and in no acute distress Head: Normocephalic and atraumatic.  Mouth/Throat: Oropharynx is clear and moist.  Neck: Normal range of motion. Neck supple.  Cardiovascular: Normal rate and regular rhythm.   Pulmonary/Chest: Effort normal and breath sounds normal.  Abdominal: Soft. There is no tenderness. There is no rebound and no guarding. Moderate distension.  Musculoskeletal: Normal range of motion. +2 bilateral pitting edema.  Neurological: Alert, no facial droop, fluent speech, moves all extremities symmetrically Skin: Skin is warm and dry.  Psychiatric:  Cooperative  ED Treatments / Results  Labs (all labs ordered are listed, but only abnormal results are displayed) Labs Reviewed  CBC WITH DIFFERENTIAL/PLATELET - Abnormal; Notable for the following:       Result Value   WBC 11.1 (*)    RBC 3.34 (*)    Hemoglobin 11.6 (*)    HCT 34.6 (*)    MCV 103.6 (*)    MCH 34.7 (*)    Neutro Abs 9.2 (*)    All other components within normal limits  COMPREHENSIVE METABOLIC PANEL - Abnormal; Notable for the following:    Sodium 126 (*)    Potassium 2.6 (*)    Chloride 80 (*)    CO2 35 (*)    Glucose, Bld 184 (*)    Calcium 8.5 (*)    Total Protein 8.2 (*)    Albumin 2.2 (*)    AST 56 (*)    Total Bilirubin 3.4 (*)    All other components within normal limits  PROTIME-INR - Abnormal; Notable for the following:    Prothrombin Time 17.3 (*)    All other components within normal limits  LIPASE, BLOOD  APTT  MAGNESIUM  BRAIN NATRIURETIC PEPTIDE  OSMOLALITY  OSMOLALITY, URINE    EKG  EKG Interpretation  Date/Time:  Tuesday May 21 2016 11:28:58 EDT Ventricular Rate:  103 PR Interval:    QRS Duration: 88 QT Interval:  394 QTC Calculation: 516 R Axis:     Text Interpretation:  Sinus tachycardia Probable anteroseptal infarct, old Borderline T abnormalities, inferior leads Prolonged QT interval No prior EKG  Confirmed by Felecia Stanfill MD, Charlene Detter [phone]) on 05/21/2016 11:52:00 AM       Radiology Dg Chest 2 View  Result Date: 05/21/2016 CLINICAL DATA:  Lower extremity edema with abdominal distention and dyspnea with exertion EXAM: CHEST  2 VIEW COMPARISON:  May 28, 2014 FINDINGS: Degree of inspiration is quite shallow. There is bibasilar atelectasis. Lungs elsewhere are clear. Heart size and pulmonary vascularity are normal. No adenopathy. No bone lesions. IMPRESSION: Shallow degree of inspiration. Bibasilar atelectasis. No edema or consolidation. Cardiac silhouette within normal limits. Electronically Signed   By: Lowella Grip III  M.D.   On: 05/21/2016 10:24    Procedures Procedures   CRITICAL CARE Performed by: Forde Dandy   Total critical care time: 40 minutes  Critical care time was exclusive of separately billable procedures and treating other patients.  Critical care was necessary to treat or prevent imminent or life-threatening deterioration.  Critical care was time spent personally by me on the following activities: development of treatment plan with patient and/or surrogate as well as nursing, discussions with consultants, evaluation of patient's response to treatment, examination of patient, obtaining history from patient or surrogate, ordering and performing treatments and interventions, ordering and review of laboratory studies, ordering and review of radiographic studies, pulse oximetry and re-evaluation of patient's condition.   COORDINATION OF CARE: 9:09 AM Discussed next steps with pt. Pt verbalized understanding and is agreeable with the plan.    Medications Ordered in ED Medications  potassium chloride 10 mEq in 100 mL IVPB (10 mEq Intravenous New Bag/Given 05/21/16 1134)  potassium chloride SA (K-DUR,KLOR-CON) CR tablet 40 mEq (40 mEq  Oral Given 05/21/16 1129)  0.9 %  sodium chloride infusion ( Intravenous New Bag/Given 05/21/16 1153)     Initial Impression / Assessment and Plan / ED Course  I have reviewed the triage vital signs and the nursing notes.  Pertinent labs & imaging results that were available during my care of the patient were reviewed by me and considered in my medical decision making (see chart for details).  Clinical Course   54 year old male with history of chronic alcohol abuse who presents with lower extremity edema and abdominal distention. Looks chronically ill and malnourished on presentation but is in no acute distress. Blood work concerning for hyponatremia of 126, hypoalbuminemia of 2.2 and hypokalemia of 2.6. With likely small amount of liver dysfunction, as  tbili 3.4, low albumin and minimal transaminitis with normal INR. Electrolyte derangements and hypoalbuminemia related to alcohol abuse likely. Normal magnesium. Given potassium repletion and NS at maintenance. Discussed with Dr. Marin Comment and admit for ongoing management.  I personally performed the services described in this documentation, which was scribed in my presence. The recorded information has been reviewed and is accurate.  Final Clinical Impressions(s) / ED Diagnoses   Final diagnoses:  Hyponatremia  Hypokalemia  Lower extremity edema    New Prescriptions New Prescriptions   No medications on file     Forde Dandy, MD 05/21/16 1245

## 2016-05-21 NOTE — H&P (Signed)
History and Physical    Thomas Pratt [id number] DOB: November 16, 1960 DOA: 05/21/2016  PCP: No PCP Per Patient  Patient coming from: Home   Chief Complaint:   HPI: Thomas Pratt is a 55 y.o. male with very benign PMH, but likely by virtue of hardly ever saw any physician, hx of hypertension, presented with complaints of worsening abdominal distention that onset one month ago. Per daughter he was a heavy drinker who recently stopped drinking one month ago. He went to see a new PCP at the clinic, and was started on Aldactone and Lasix, and referred to GI whom he saw one time.  He admitted to having shortness of breath and LLQ edema. He denies any chest pain, fever, or chills. He has easy bruising, but no bleeding.  He had no abdominal pain.Work up in the ER showed Potassium 2.6, Sodium 126, WBC 11.1, 11.6 hemoglobin, albumin 2.2, CXR shows shallow degree of inspiration.   He was given IV K supplement, IVF and hospitalist was asked to admit him for alcoholic liver cirrhosis with low albumin and electrolytes abnormality, along with ascites.   Review of Systems: As per HPI otherwise 10 point review of systems negative.    Past Medical History:  Diagnosis Date  . Hypertension     History reviewed. No pertinent surgical history.   reports that he has never smoked. He does not have any smokeless tobacco history on file. He reports that he drinks alcohol. He reports that he does not use drugs.  No Known Allergies  History reviewed. No pertinent family history.   Prior to Admission medications   Medication Sig Start Date End Date Taking? Authorizing Provider  furosemide (LASIX) 40 MG tablet Take 40 mg by mouth daily. 05/14/16  Yes Historical Provider, MD  spironolactone (ALDACTONE) 50 MG tablet Take 50 mg by mouth daily. 05/14/16  Yes Historical Provider, MD    Physical Exam: Vitals:   05/21/16 0930 05/21/16 1100 05/21/16 1137 05/21/16 1200  BP: 127/97 138/100 139/96 157/93    Pulse: 100 101 103 95  Resp: (!) 28 16 19 17   Temp:      TempSrc:      SpO2: 94% 100% 94% 97%  Weight:      Height:          Constitutional: NAD, calm, comfortable Vitals:   05/21/16 0930 05/21/16 1100 05/21/16 1137 05/21/16 1200  BP: 127/97 138/100 139/96 157/93  Pulse: 100 101 103 95  Resp: (!) 28 16 19 17   Temp:      TempSrc:      SpO2: 94% 100% 94% 97%  Weight:      Height:       Eyes: PERRL, lids and conjunctivae normal ENMT: Mucous membranes are moist. Posterior pharynx clear of any exudate or lesions.Normal dentition.  Neck: normal, supple, no masses, no thyromegaly Respiratory: clear to auscultation bilaterally, no wheezing, no crackles. Normal respiratory effort. No accessory muscle use.  Cardiovascular: Regular rate and rhythm, no murmurs / rubs / gallops. 3+[name] edema.  2+ pedal pulses. No carotid bruits.  Abdomen: appears soft, non tender, and distended with fluid waves.  He does have notable ascites. Bowel sounds positive.  Musculoskeletal: no clubbing / cyanosis. No joint deformity upper and lower extremities. Good ROM, no contractures. Normal muscle tone.  Skin: no rashes, lesions, ulcers. No induration Neurologic: CN 2-12 grossly intact. Sensation intact, DTR normal. Strength 5/5 in all 4. No asterixis.  Psychiatric: Normal judgment and insight. Alert and oriented x 3.  Normal mood.   Labs on Admission: I have personally reviewed following labs and imaging studies  Recent Labs Lab 05/21/16 0924  WBC 11.1*  NEUTROABS 9.2*  HGB 11.6*  HCT 34.6*  MCV 103.6*  PLT 266    Recent Labs Lab 05/21/16 0924  NA 126*  K 2.6*  CL 80*  CO2 35*  GLUCOSE 184*  BUN 17  CREATININE 0.70  CALCIUM 8.5*  MG 1.8    Recent Labs Lab 05/21/16 0924  AST 56*  ALT 20  ALKPHOS 78  BILITOT 3.4*  PROT 8.2*  ALBUMIN 2.2*    Recent Labs Lab 05/21/16 0924  LIPASE 39    Recent Labs Lab 05/21/16 0924  INR 1.40    Radiological Exams on Admission: Dg Chest  2 View  Result Date: 05/21/2016 CLINICAL DATA:  Lower extremity edema with abdominal distention and dyspnea with exertion EXAM: CHEST  2 VIEW COMPARISON:  May 28, 2014 FINDINGS: Degree of inspiration is quite shallow. There is bibasilar atelectasis. Lungs elsewhere are clear. Heart size and pulmonary vascularity are normal. No adenopathy. No bone lesions. IMPRESSION: Shallow degree of inspiration. Bibasilar atelectasis. No edema or consolidation. Cardiac silhouette within normal limits. Electronically Signed   By: Lowella Grip III M.D.   On: 05/21/2016 10:24    EKG: Independently reviewed. EKG shows sinus tachycardia.   Assessment/Plan  1. Abdominal distention. I suspect this is alcoholic cirrhosis with ascites.  Will obtain an abdominal CT with contrast. I will start him on IV Lasix to both get rid of his fluid, and correct his Na.  Will check hepatitis A, B, and C, Continue with Aldactone, and follow Cr carefully.  Will need GI consultation. Check NH3 level, and Tx if elevated.  Check INR, and give vit K if elevated.  Consider large volume paracentesis if diuresis unsuccesful.  2. Hypokalemia. Replace.  3. Hyponatremia. Will hold off on any further NS.  IV Lasix to get gid of free water.  4. Hypoalbuminemia. Total bilirubin 3.4.  Would give IV albumin if he requires large volume paracentesis.  5. Leukocytosis. Patient has an elevated white blood cell count of 11,100.  He has no abdominal pain, fever, or chills.  6. Macrocytic anemia Patient has a hemoglobin of 11.6.   DVT prophylaxis: Heparin SQ.  Code Status: FULL  Family Communication: Son and daughter bedside.   Disposition Plan: Discharge home once improved.  Consults called: None  Admission status: Inpatient    Orvan Falconer, MD FACP [facility] If 7PM-7AM, please contact night-coverage [email or web address] Password [email or web address]  05/21/2016, 12:46 PM    By signing my name below, I, Collene Leyden, attest that this documentation  has been prepared under the direction and in the presence of Orvan Falconer, MD. Electronically signed: Collene Leyden, Scribe.05/21/16

## 2016-05-21 NOTE — ED Triage Notes (Signed)
Pt reports swelling in legs and abdomen.  Pt also slightly jaundiced and reports some shortness of breath.  Denies any history of liver problems.

## 2016-05-21 NOTE — Consult Note (Signed)
Referring Provider: Dr. Marin Comment  Primary Care Physician:  No PCP Per Patient Primary Gastroenterologist:  Dr. Britta Mccreedy prior to admission   Date of Admission: 05/21/16 Date of Consultation: 05/21/16  Reason for Consultation:  Decompensated cirrhosis, ETOH hepatitis   HPI:  Thomas Pratt is a 55 y.o. year old male with history of alcohol abuse, presenting with worsening abdominal distension, lower extremity edema for the past month. CT abd/pelvis ordered. Acute viral markers ordered.   Had seen Dr. Britta Mccreedy last Monday. Had been started on Lasix 40 mg and Aldactone 50 mg daily but without any improvement. US abdomen had been discussed but not completed. States he had been to [facility] about a month ago due to abdominal swelling and was told they suspected he had cirrhosis due to alcohol use. No abdominal pain. No nausea/vomiting. Sometimes confusion. No ETOH use in about a month. Used to drink beer over a 12 pack a day for 20+ years but worsening alcohol use over past few years. Urine looks like cranberry juice. No overt GI bleeding. No prior colonoscopy or upper endoscopy. No prior history of blood transfusion. No IV drug use. Noted loss of appetite. Feels hard to get in a deep breath.   Past Medical History:  Diagnosis Date  . Cirrhosis ([place])    likely ETOH use   . Hypertension     Past Surgical History:  Procedure Laterality Date  . NO PAST SURGERIES      Prior to Admission medications   Medication Sig Start Date End Date Taking? Authorizing Provider  furosemide (LASIX) 40 MG tablet Take 40 mg by mouth daily. 05/14/16  Yes Historical Provider, MD  spironolactone (ALDACTONE) 50 MG tablet Take 50 mg by mouth daily. 05/14/16  Yes Historical Provider, MD    Current Facility-Administered Medications  Medication Dose Route Frequency Provider Last Rate Last Dose  . feeding supplement (ENSURE ENLIVE) (ENSURE ENLIVE) liquid 237 mL  237 mL Oral BID BM Orvan Falconer, MD      . furosemide (LASIX)  injection 40 mg  40 mg Intravenous BID Orvan Falconer, MD      . heparin injection 5,000 Units  5,000 Units Subcutaneous Q8H Orvan Falconer, MD      . iopamidol (ISOVUE-300) 61 % injection           . sodium chloride flush (NS) 0.9 % injection 3 mL  3 mL Intravenous Q12H Orvan Falconer, MD      . spironolactone (ALDACTONE) tablet 50 mg  50 mg Oral Daily Orvan Falconer, MD        Allergies as of 05/21/2016  . (No Known Allergies)    Family History  Problem Relation Age of Onset  . Liver disease Neg Hx   . Colon cancer Neg Hx   . Colon polyps Neg Hx     Social History   Social History  . Marital status: Single    Spouse name: N/A  . Number of children: N/A  . Years of education: N/A   Occupational History  . Not on file.   Social History Main Topics  . Smoking status: Never Smoker  . Smokeless tobacco: Never Used  . Alcohol use No     Comment: Quit 1 month ago, used to drink ETOH heavily   . Drug use: No  . Sexual activity: Not Currently   Other Topics Concern  . Not on file   Social History Narrative  . No narrative on file    Review of Systems: Gen: see  HPI  CV: Denies chest pain, heart palpitations, syncope, edema  Resp: see HPI  GI: Denies dysphagia or odynophagia. Denies vomiting blood, jaundice, and fecal incontinence.  GU : see HPI  MS: see HPI  Derm: Denies rash, itching, dry skin Psych: Denies depression, anxiety,confusion, or memory loss Heme: see HPI   Physical Exam: Vital signs in last 24 hours: Temp:  [97.7 F (36.5 C)-98 F (36.7 C)] 97.7 F (36.5 C) (10/31 1615) Pulse Rate:  [95-104] 97 (10/31 1615) Resp:  [2-30] 2 (10/31 1615) BP: (127-157)/(83-116) 142/83 (10/31 1615) SpO2:  [92 %-100 %] 92 % (10/31 1615) Weight:  [160 lb (72.6 kg)-191 lb (86.6 kg)] 191 lb (86.6 kg) (10/31 1615)   General:   Alert, sallow-appearing. Muscle wasting to upper extremities, face thin, appears older than stated age  Head:  Normocephalic and atraumatic. Eyes:  Mild scleral icterus   Ears:  Normal auditory acuity. Nose:  No deformity, discharge,  or lesions. Mouth:  Poor dentition  Lungs:  Clear throughout to auscultation.    Heart:  Regular rate and rhythm; no murmurs Abdomen:  Positive bowel sounds with tense ascites, anasarca to bilateral flanks, no tenderness  Rectal:  Deferred until time of colonoscopy.   Msk:  Symmetrical without gross deformities. Normal posture. Extremities:  3+ pitting lower extremity edema extending to upper thigh  Neurologic:  Alert and  oriented x4 Psych:  Alert and cooperative. Normal mood and affect.  Intake/Output from previous day: No intake/output data recorded. Intake/Output this shift: No intake/output data recorded.  Lab Results:  Recent Labs  05/21/16 0924  WBC 11.1*  HGB 11.6*  HCT 34.6*  PLT 266   BMET  Recent Labs  05/21/16 0924  NA 126*  K 2.6*  CL 80*  CO2 35*  GLUCOSE 184*  BUN 17  CREATININE 0.70  CALCIUM 8.5*   LFT  Recent Labs  05/21/16 0924  PROT 8.2*  ALBUMIN 2.2*  AST 56*  ALT 20  ALKPHOS 78  BILITOT 3.4*   PT/INR  Recent Labs  05/21/16 0924  LABPROT 17.3*  INR 1.40    Studies/Results: Dg Chest 2 View  Result Date: 05/21/2016 CLINICAL DATA:  Lower extremity edema with abdominal distention and dyspnea with exertion EXAM: CHEST  2 VIEW COMPARISON:  May 28, 2014 FINDINGS: Degree of inspiration is quite shallow. There is bibasilar atelectasis. Lungs elsewhere are clear. Heart size and pulmonary vascularity are normal. No adenopathy. No bone lesions. IMPRESSION: Shallow degree of inspiration. Bibasilar atelectasis. No edema or consolidation. Cardiac silhouette within normal limits. Electronically Signed   By: Lowella Grip III M.D.   On: 05/21/2016 10:24    Impression: 55 year old male with history of ETOH abuse and reported history of cirrhosis (MELD Na 24), presenting with decompensated features including ascites, anasarca, electrolyte abnormalities. Discriminant  function calculated at 30; therefore prednisolone not indicated at this time. Viral markers have been ordered. CT abd/pelvis has been ordered as well for further imaging purposes. Encouragingly, he has abstained from alcohol from a month. I discussed in detail absolute avoidance of this in the future. Multiple family members at bedside at time of consultation.   Tense ascites: will order ultrasound with paracentesis for 11/1, along with fluid analysis to include cytology and AFB culture. Albumin to be given if greater than 4 liters.   Routine cirrhosis care can be undertaken as outpatient to include variceal screening, serial ultrasounds, vaccinations, screening colonoscopy.    Plan: Follow-up on pending CT Follow-up on viral markers Diuresis:  ordered per attending Paracentesis on 11/1 with fluid analysis and Albumin ordered at time of paracentesis Outpatient cirrhosis care Absolute ETOH abstinence   Annitta Needs, ANP-BC [facility]     LOS: 0 days    05/21/2016, 4:39 PM

## 2016-05-21 NOTE — ED Notes (Signed)
Pt placed on nasal cannula due to sats 88% on room air.

## 2016-05-22 ENCOUNTER — Encounter (HOSPITAL_COMMUNITY): Payer: Self-pay

## 2016-05-22 ENCOUNTER — Inpatient Hospital Stay (HOSPITAL_COMMUNITY): Payer: 59

## 2016-05-22 DIAGNOSIS — R600 Localized edema: Secondary | ICD-10-CM

## 2016-05-22 DIAGNOSIS — E440 Moderate protein-calorie malnutrition: Secondary | ICD-10-CM | POA: Insufficient documentation

## 2016-05-22 LAB — CBC
HCT: 32.2 % — ABNORMAL LOW (ref 39.0–52.0)
Hemoglobin: 10.7 g/dL — ABNORMAL LOW (ref 13.0–17.0)
MCH: 34.2 pg — ABNORMAL HIGH (ref 26.0–34.0)
MCHC: 33.2 g/dL (ref 30.0–36.0)
MCV: 102.9 fL — AB (ref 78.0–100.0)
PLATELETS: 236 10*3/uL (ref 150–400)
RBC: 3.13 MIL/uL — ABNORMAL LOW (ref 4.22–5.81)
RDW: 15.1 % (ref 11.5–15.5)
WBC: 9.2 10*3/uL (ref 4.0–10.5)

## 2016-05-22 LAB — GRAM STAIN

## 2016-05-22 LAB — BODY FLUID CELL COUNT WITH DIFFERENTIAL
EOS FL: 0 %
Lymphs, Fluid: 84 %
MONOCYTE-MACROPHAGE-SEROUS FLUID: 12 % — AB (ref 50–90)
Neutrophil Count, Fluid: 4 % (ref 0–25)
Total Nucleated Cell Count, Fluid: 129 cu mm (ref 0–1000)

## 2016-05-22 LAB — BASIC METABOLIC PANEL
ANION GAP: 7 (ref 5–15)
BUN: 16 mg/dL (ref 6–20)
CALCIUM: 8.3 mg/dL — AB (ref 8.9–10.3)
CO2: 37 mmol/L — ABNORMAL HIGH (ref 22–32)
CREATININE: 0.73 mg/dL (ref 0.61–1.24)
Chloride: 83 mmol/L — ABNORMAL LOW (ref 101–111)
GLUCOSE: 113 mg/dL — AB (ref 65–99)
Potassium: 3.3 mmol/L — ABNORMAL LOW (ref 3.5–5.1)
Sodium: 127 mmol/L — ABNORMAL LOW (ref 135–145)

## 2016-05-22 LAB — HEPATITIS PANEL, ACUTE
HCV Ab: 0.3 s/co ratio (ref 0.0–0.9)
HEP A IGM: NEGATIVE
HEP B C IGM: NEGATIVE
Hepatitis B Surface Ag: NEGATIVE

## 2016-05-22 LAB — PROTIME-INR
INR: 1.52
Prothrombin Time: 18.5 seconds — ABNORMAL HIGH (ref 11.4–15.2)

## 2016-05-22 NOTE — Progress Notes (Signed)
PROGRESS NOTE    Kendarious [place]  [id number] DOB: [birth year] DOA: 05/21/2016 PCP: No PCP Per Patient    Brief Narrative:  65 yom who hardly ever sees a physician with a history of hypertension and a recent clinic visit who was being treated with aldactone and lasix, presents with complaints of worsening abdominal distention. He is a recovering alcoholic of one month. While in the ED, he was noted to be severely hypokalemic, anemic, and hyponatremic with leukocytosis. His albumin was also noted to be low at 2.2 and ammonia 49. He was started on IV potassium supplementation and IV fluids and admitted for further evaluation of alcoholic cirrhosis of the liver.   Assessment & Plan:   Active Problems:   Liver cirrhosis (HCC)   Hypoalbuminemia   Hyponatremia   Hypokalemia   HTN (hypertension)   Ascites   ALC (alcoholic liver cirrhosis) ([place])  1. Hepatic cirrhosis. This is likely due to alcohol. Patient is a recovering alcoholic of one month. Abdominal CT with contrast shows a large amount of ascites and nodular hepatic contour. Will continue lasix and Aldactone due to volume overload and low sodium. GI was consulted and recommended a US paracentesis today. Follow-up fluid analysis 2. Hypokalemia. Replaced.  3. Hyponatremia. IV fluids have been held due to volume overload. Continue lasix.  4. Hypoalbuminemia. Likely related to liver disease. He will receive IV albumin if paracentesis is large volume.  5. Macrocytic anemia. Hemoglobin was 11.6 on admission, now 10.7, trending down. There are no active signs of bleeding at this time. Likely related to alcohol consumption. Continue to follow.  6. Leukocytosis. White blood cell count was 11,100 on admission, now within normal limits.   DVT prophylaxis: Heparin injection  Code Status: FULL  Family Communication: family bedside Disposition Plan: Discharge home once improved.    Consultants:   Gastroenterology   Procedures:   None     Antimicrobials:   None    Subjective: No shortness of breath, chest pain. Complains abdomen is distended.  Objective: Vitals:   05/21/16 1615 05/21/16 1949 05/21/16 2108 05/22/16 0500  BP: (!) 142/83  112/66 104/69  Pulse: 97  98 (!) 109  Resp: (!) 2  20 20   Temp: 97.7 F (36.5 C)  97.7 F (36.5 C) 97.7 F (36.5 C)  TempSrc: Oral  Oral Oral  SpO2: 92% 95% 97% 92%  Weight: 86.6 kg (191 lb)     Height: 5\' 8"  (1.727 m)       Intake/Output Summary (Last 24 hours) at 05/22/16 0811 Last data filed at 05/21/16 2110  Gross per 24 hour  Intake          1539.75 ml  Output              100 ml  Net          1439.75 ml   Filed Weights   05/21/16 0901 05/21/16 1615  Weight: 72.6 kg (160 lb) 86.6 kg (191 lb)    Examination:  General exam: Appears calm and comfortable  Respiratory system: Clear to auscultation. Respiratory effort normal. Cardiovascular system: S1 & S2 heard, RRR. No JVD, murmurs, rubs, gallops or clicks. 1-2+ pedal edema. Gastrointestinal system: Abdomen is distended, soft and nontender. No organomegaly or masses felt. Normal bowel sounds heard. Central nervous system: Alert and oriented. No focal neurological deficits. Extremities: Symmetric 5 x 5 power. Skin: No rashes, lesions or ulcers Psychiatry: Judgement and insight appear normal. Mood & affect appropriate.     Data  Reviewed: I have personally reviewed following labs and imaging studies  CBC:  Recent Labs Lab 05/21/16 0924 05/22/16 0613  WBC 11.1* 9.2  NEUTROABS 9.2*  --   HGB 11.6* 10.7*  HCT 34.6* 32.2*  MCV 103.6* 102.9*  PLT 266 [id number]   Basic Metabolic Panel:  Recent Labs Lab 05/21/16 0924 05/22/16 0613  NA 126* 127*  K 2.6* 3.3*  CL 80* 83*  CO2 35* 37*  GLUCOSE 184* 113*  BUN 17 16  CREATININE 0.70 0.73  CALCIUM 8.5* 8.3*  MG 1.8  --    GFR: Estimated Creatinine Clearance: 110.4 mL/min (by C-G formula based on SCr of 0.73 mg/dL). Liver Function Tests:  Recent  Labs Lab 05/21/16 0924  AST 56*  ALT 20  ALKPHOS 78  BILITOT 3.4*  PROT 8.2*  ALBUMIN 2.2*    Recent Labs Lab 05/21/16 0924  LIPASE 39    Recent Labs Lab 05/21/16 1556  AMMONIA 49*   Coagulation Profile:  Recent Labs Lab 05/21/16 0924 05/22/16 0613  INR 1.40 1.52   Cardiac Enzymes: No results for input(s): CKTOTAL, CKMB, CKMBINDEX, TROPONINI in the last 168 hours. BNP (last 3 results) No results for input(s): PROBNP in the last 8760 hours. HbA1C: No results for input(s): HGBA1C in the last 72 hours. CBG: No results for input(s): GLUCAP in the last 168 hours. Lipid Profile: No results for input(s): CHOL, HDL, LDLCALC, TRIG, CHOLHDL, LDLDIRECT in the last 72 hours. Thyroid Function Tests: No results for input(s): TSH, T4TOTAL, FREET4, T3FREE, THYROIDAB in the last 72 hours. Anemia Panel: No results for input(s): VITAMINB12, FOLATE, FERRITIN, TIBC, IRON, RETICCTPCT in the last 72 hours. Sepsis Labs: No results for input(s): PROCALCITON, LATICACIDVEN in the last 168 hours.  No results found for this or any previous visit (from the past 240 hour(s)).       Radiology Studies: Dg Chest 2 View  Result Date: 05/21/2016 CLINICAL DATA:  Lower extremity edema with abdominal distention and dyspnea with exertion EXAM: CHEST  2 VIEW COMPARISON:  May 28, 2014 FINDINGS: Degree of inspiration is quite shallow. There is bibasilar atelectasis. Lungs elsewhere are clear. Heart size and pulmonary vascularity are normal. No adenopathy. No bone lesions. IMPRESSION: Shallow degree of inspiration. Bibasilar atelectasis. No edema or consolidation. Cardiac silhouette within normal limits. Electronically Signed   By: Lowella Grip III M.D.   On: 05/21/2016 10:24   Ct Abdomen Pelvis W Contrast  Result Date: 05/21/2016 CLINICAL DATA:  Severe abdominal distention.  Shortness of breath. EXAM: CT ABDOMEN AND PELVIS WITH CONTRAST TECHNIQUE: Multidetector CT imaging of the abdomen  and pelvis was performed using the standard protocol following bolus administration of intravenous contrast. CONTRAST:  137mL ISOVUE-300 IOPAMIDOL (ISOVUE-300) INJECTION 61% COMPARISON:  None. FINDINGS: Lower chest: There is right lower lobe atelectasis/consolidation. No pleural effusion. Hepatobiliary: The liver is shrunken with a nodular contour. There is large volume ascites throughout the entire abdomen and pelvis. Small caliber recannulized umbilical vein. Pancreas: Pancreatic contours and enhancement are normal. Spleen: There are multiple areas of splenic hypoattenuation. The spleen is not enlarged. Adrenals/Urinary Tract: The adrenal glands are normal. Both kidneys are normal. Stomach/Bowel: No dilated small bowel or other evidence of obstruction. No enteric or colonic inflammation. The appendix is normal. No pneumobilia or portal venous gas. Vascular/Lymphatic: There is atherosclerotic calcification of the non aneurysmal abdominal aorta. The portal vein, superior mesenteric vein and splenic vein are patent. No abdominal or pelvic lymphadenopathy identified. Reproductive: Prostate and seminal vesicles are unremarkable. Other: None  Musculoskeletal: No bony spinal canal stenosis. No lytic or blastic osseous lesions. IMPRESSION: 1. Large amount of abdominal/pelvic ascites. 2. Nodular hepatic contour, suggesting hepatic cirrhosis. 3. Aortic atherosclerosis. 4. Right basilar atelectasis/consolidation. 5. Multiple areas of spine hypoattenuation may be small splenic infarcts related to underlying portal hypertension. Electronically Signed   By: Ulyses Jarred M.D.   On: 05/21/2016 19:48        Scheduled Meds: . feeding supplement (ENSURE ENLIVE)  237 mL Oral BID BM  . furosemide  40 mg Intravenous BID  . heparin  5,000 Units Subcutaneous Q8H  . sodium chloride flush  3 mL Intravenous Q12H  . spironolactone  50 mg Oral Daily   Continuous Infusions:    LOS: 1 day    Time spent: 25 minutes     Kathie Dike, MD [facility] If 7PM-7AM, please contact night-coverage [email or web address] Password [email or web address] 05/22/2016, 8:11 AM

## 2016-05-22 NOTE — Procedures (Signed)
PreOperative Dx: Cirrhosis, ascites Postoperative Dx: Cirrhosis, ascites Procedure:   US guided paracentesis Radiologist:  Thornton Papas Anesthesia:  10 ml of1% lidocaine Specimen:  4.18 L of yellow ascitic fluid EBL:   < 1 ml Complications: None

## 2016-05-22 NOTE — Progress Notes (Signed)
Paracentesis complete no signs of distress. 4000 ml yellow colored ascites removed.  

## 2016-05-22 NOTE — Progress Notes (Signed)
Subjective:  Patient without complaints. States he is urinating well but only 100cc recorded in last 24 hours.   Objective: Vital signs in last 24 hours: Temp:  [97.7 F (36.5 C)-98 F (36.7 C)] 97.7 F (36.5 C) (11/01 0500) Pulse Rate:  [95-109] 109 (11/01 0500) Resp:  [2-30] 20 (11/01 0500) BP: (104-157)/(66-116) 104/69 (11/01 0500) SpO2:  [92 %-100 %] 92 % (11/01 0500) Weight:  [160 lb (72.6 kg)-191 lb (86.6 kg)] 191 lb (86.6 kg) (10/31 1615) Last BM Date: 05/21/16 General:   Alert,  Chronically ill-appearing. Appears older than stated age.  pleasant and cooperative in NAD Head:  Normocephalic and atraumatic. Eyes:  Scleral icterus.  Abdomen:  Soft, distended and tense. Nontender. +BS. Extremities:  Without clubbing, deformity. 2-3+ pitting edema to knees bilaterally. 1+ thighs.  Neurologic:  Alert and  oriented x4;  grossly normal neurologically. Skin:  Intact without significant lesions or rashes.+jaundice. Psych:  Alert and cooperative. Normal mood and affect.  Intake/Output from previous day: 10/31 0701 - 11/01 0700 In: 1539.8 [P.O.:840; I.V.:699.8] Out: 100 [Urine:100] Intake/Output this shift: No intake/output data recorded.  Lab Results: CBC  Recent Labs  05/21/16 0924 05/22/16 0613  WBC 11.1* 9.2  HGB 11.6* 10.7*  HCT 34.6* 32.2*  MCV 103.6* 102.9*  PLT 266 236   BMET  Recent Labs  05/21/16 0924 05/22/16 0613  NA 126* 127*  K 2.6* 3.3*  CL 80* 83*  CO2 35* 37*  GLUCOSE 184* 113*  BUN 17 16  CREATININE 0.70 0.73  CALCIUM 8.5* 8.3*   LFTs  Recent Labs  05/21/16 0924  BILITOT 3.4*  ALKPHOS 78  AST 56*  ALT 20  PROT 8.2*  ALBUMIN 2.2*    Recent Labs  05/21/16 0924  LIPASE 39   PT/INR  Recent Labs  05/21/16 0924 05/22/16 0613  LABPROT 17.3* 18.5*  INR 1.40 1.52      Imaging Studies: Dg Chest 2 View  Result Date: 05/21/2016 CLINICAL DATA:  Lower extremity edema with abdominal distention and dyspnea with exertion EXAM:  CHEST  2 VIEW COMPARISON:  May 28, 2014 FINDINGS: Degree of inspiration is quite shallow. There is bibasilar atelectasis. Lungs elsewhere are clear. Heart size and pulmonary vascularity are normal. No adenopathy. No bone lesions. IMPRESSION: Shallow degree of inspiration. Bibasilar atelectasis. No edema or consolidation. Cardiac silhouette within normal limits. Electronically Signed   By: Lowella Grip III M.D.   On: 05/21/2016 10:24   Ct Abdomen Pelvis W Contrast  Result Date: 05/21/2016 CLINICAL DATA:  Severe abdominal distention.  Shortness of breath. EXAM: CT ABDOMEN AND PELVIS WITH CONTRAST TECHNIQUE: Multidetector CT imaging of the abdomen and pelvis was performed using the standard protocol following bolus administration of intravenous contrast. CONTRAST:  12mL ISOVUE-300 IOPAMIDOL (ISOVUE-300) INJECTION 61% COMPARISON:  None. FINDINGS: Lower chest: There is right lower lobe atelectasis/consolidation. No pleural effusion. Hepatobiliary: The liver is shrunken with a nodular contour. There is large volume ascites throughout the entire abdomen and pelvis. Small caliber recannulized umbilical vein. Pancreas: Pancreatic contours and enhancement are normal. Spleen: There are multiple areas of splenic hypoattenuation. The spleen is not enlarged. Adrenals/Urinary Tract: The adrenal glands are normal. Both kidneys are normal. Stomach/Bowel: No dilated small bowel or other evidence of obstruction. No enteric or colonic inflammation. The appendix is normal. No pneumobilia or portal venous gas. Vascular/Lymphatic: There is atherosclerotic calcification of the non aneurysmal abdominal aorta. The portal vein, superior mesenteric vein and splenic vein are patent. No abdominal or pelvic lymphadenopathy identified.  Reproductive: Prostate and seminal vesicles are unremarkable. Other: None Musculoskeletal: No bony spinal canal stenosis. No lytic or blastic osseous lesions. IMPRESSION: 1. Large amount of  abdominal/pelvic ascites. 2. Nodular hepatic contour, suggesting hepatic cirrhosis. 3. Aortic atherosclerosis. 4. Right basilar atelectasis/consolidation. 5. Multiple areas of spine hypoattenuation may be small splenic infarcts related to underlying portal hypertension. Electronically Signed   By: Ulyses Jarred M.D.   On: 05/21/2016 19:48  [2 weeks]   Assessment: 55 year old male with history of ETOH abuse and reported history of cirrhosis (MELD Na 24), presenting with decompensated features including ascites, anasarca, electrolyte abnormalities. Discriminant function calculated at 30; therefore prednisolone not indicated at this time. Viral markers negative. CT abd/pelvis yesterday with large amount of ascites, nodular hepatic contour suggesting cirrhosis, spleen normal in size but multiple areas of ?splenic infarcts ?related to underlying portal hypertension. Encouragingly, he has abstained from alcohol from a month.  Tense ascites: LVAP planned for today, along with fluid analysis to include cytology and AFB culture. Albumin to be given if greater than 4 liters.   Routine cirrhosis care can be undertaken as outpatient to include variceal screening, serial ultrasounds, vaccinations, screening colonoscopy.  Patient's weights are inaccurate with 30 lb difference recorded in 5 hours. Continue daily weights and strict I/Os.   Plan: 1. F/u fluid analysis post LVAP. 2. Strict I/Os and daily weights.  3. Continue current diuretic regimen for now. 4. F/u pending labs. 5. Outpatient cirrhosis care. Patient wants to continue care with [facility].   Laureen Ochs. Bernarda Caffey [facility] [phone] 11/1/20179:17 AM     LOS: 1 day

## 2016-05-22 NOTE — Progress Notes (Signed)
Initial Nutrition Assessment  DOCUMENTATION CODES:   Non-severe (moderate) malnutrition in context of chronic illness  INTERVENTION:  Ensure Enlive po BID, each supplement provides 350 kcal and 20 grams of protein   Provide Low Sodium diet education  Daily snack qhs   NUTRITION DIAGNOSIS:   Inadequate oral intake related to increased metabolic demand in chronic illness as evidenced by estimated needs, moderate depletions of muscle mass, severe fluid accumulation.   GOAL:   Patient will meet greater than or equal to 90% of their needs   MONITOR:   Supplement acceptance, PO intake, Weight trends, Labs  REASON FOR ASSESSMENT:   Malnutrition Screening Tool    ASSESSMENT:  Patient has hx of ETOH abuse and has been off alcohol for about 1 month. He presents with c/o abdominal distention and has hx of acholic liver cirrhosis. Pt has ascites and is just returning from having paracentisis (4) liters removed.    Pt reports diminished appetite and meal records show pt eating 50-75% of meals which is not meeting his est needs. He doesn't know what his usual weight is but says he weighed 205# last week when he was at [facility].  Nutrition-Focused physical exam completed. Findings are mild fat depletion, mild to moderate muscle depletion, and moderate to severe edema. He meets criteria for at least moderate malnutrition (chronic liver disease) due to his loss of fat mass and muscle and his increased fluid accumulation. He is at high risk for worsening nutrition status due to his increased needs and reported blunted desire to eat. Hopefully this will improve as his fluid status gets better.    Recent Labs Lab 05/21/16 0924 05/22/16 0613  NA 126* 127*  K 2.6* 3.3*  CL 80* 83*  CO2 35* 37*  BUN 17 16  CREATININE 0.70 0.73  CALCIUM 8.5* 8.3*  MG 1.8  --   GLUCOSE 184* 113*   Labs:  Diet Order:  Diet 2 gram sodium Room service appropriate? Yes; Fluid consistency:  Thin  Skin:  Reviewed, no issues  Last BM:  10/31  Height:   Ht Readings from Last 1 Encounters:  05/21/16 5\' 8"  (1.727 m)    Weight:   Wt Readings from Last 1 Encounters:  05/21/16 191 lb (86.6 kg)    Ideal Body Weight:     BMI:  Body mass index is 29.04 kg/m.  Estimated Nutritional Needs:   Kcal:  2200-2450  Protein:  98-105 gr  Fluid:  1.8 liters daily  EDUCATION NEEDS:   Pt is aware of recommendations for eating small frequent meals but is not compliant with low sodium diet recommendations.   Colman Cater MS,RD,CSG,LDN Office: [phone] Pager: [phone]

## 2016-05-23 DIAGNOSIS — K7011 Alcoholic hepatitis with ascites: Secondary | ICD-10-CM

## 2016-05-23 DIAGNOSIS — E871 Hypo-osmolality and hyponatremia: Secondary | ICD-10-CM

## 2016-05-23 DIAGNOSIS — E876 Hypokalemia: Secondary | ICD-10-CM

## 2016-05-23 DIAGNOSIS — E440 Moderate protein-calorie malnutrition: Secondary | ICD-10-CM

## 2016-05-23 DIAGNOSIS — E8809 Other disorders of plasma-protein metabolism, not elsewhere classified: Secondary | ICD-10-CM

## 2016-05-23 LAB — CBC
HCT: 29.1 % — ABNORMAL LOW (ref 39.0–52.0)
HEMOGLOBIN: 9.8 g/dL — AB (ref 13.0–17.0)
MCH: 34.3 pg — AB (ref 26.0–34.0)
MCHC: 33.7 g/dL (ref 30.0–36.0)
MCV: 101.7 fL — ABNORMAL HIGH (ref 78.0–100.0)
Platelets: 184 10*3/uL (ref 150–400)
RBC: 2.86 MIL/uL — ABNORMAL LOW (ref 4.22–5.81)
RDW: 15.1 % (ref 11.5–15.5)
WBC: 8.1 10*3/uL (ref 4.0–10.5)

## 2016-05-23 LAB — BASIC METABOLIC PANEL
Anion gap: 6 (ref 5–15)
BUN: 14 mg/dL (ref 6–20)
CALCIUM: 8.2 mg/dL — AB (ref 8.9–10.3)
CO2: 37 mmol/L — AB (ref 22–32)
CREATININE: 0.69 mg/dL (ref 0.61–1.24)
Chloride: 85 mmol/L — ABNORMAL LOW (ref 101–111)
GFR calc Af Amer: >60 mL/min (ref >60)
GLUCOSE: 111 mg/dL — AB (ref 65–99)
Potassium: 3.1 mmol/L — ABNORMAL LOW (ref 3.5–5.1)
Sodium: 128 mmol/L — ABNORMAL LOW (ref 135–145)

## 2016-05-23 LAB — HEPATITIS B CORE ANTIBODY, TOTAL: Hep B Core Total Ab: NEGATIVE

## 2016-05-23 LAB — HEPATITIS B SURFACE ANTIBODY,QUALITATIVE: HEP B S AB: NONREACTIVE

## 2016-05-23 LAB — HEPATITIS A ANTIBODY, TOTAL: Hep A Total Ab: POSITIVE — AB

## 2016-05-23 MED ORDER — POTASSIUM CHLORIDE CRYS ER 20 MEQ PO TBCR
40.0000 meq | EXTENDED_RELEASE_TABLET | ORAL | Status: AC
  Administered 2016-05-23 (×2): 40 meq via ORAL
  Filled 2016-05-23 (×2): qty 2

## 2016-05-23 NOTE — Progress Notes (Signed)
Subjective: States he feels well today. Abdomen feels better after LVAP yesterday. Has had a bowel movement in the last day with no noted melena or hematochezia. No N/V. No other GI complaints.  Objective: Vital signs in last 24 hours: Temp:  [98 F (36.7 C)-98.5 F (36.9 C)] 98 F (36.7 C) (11/01 2300) Pulse Rate:  [96-102] 96 (11/01 2300) Resp:  [18-21] 21 (11/01 2300) BP: (92-113)/(48-56) 113/56 (11/01 2300) SpO2:  [87 %-94 %] 94 % (11/01 2300) Weight:  [180 lb 6.4 oz (81.8 kg)] 180 lb 6.4 oz (81.8 kg) (11/01 1900) Last BM Date: 05/22/16 General:   Alert and oriented, pleasant. Sleeping when entering the room, awakened to touch and voice. Head:  Normocephalic and atraumatic. Eyes:  No icterus, sclera clear. Conjuctiva pink.  Heart:  S1, S2 present, no murmurs noted.  Lungs: Clear to auscultation bilaterally, without wheezing, rales, or rhonchi.  Abdomen:  Bowel sounds present, soft-firm but no tense ascites appreciated, non-tender, distended. No rebound or guarding. No masses appreciated. Possible mild asterixis. Msk:  Symmetrical without gross deformities.  Pulses:  Normal pulses noted. Extremities:  Continued swelling bilateral lower extremities at 2-3+ lower legs but extending to 1+ into the mid-thigh.  Neurologic:  Alert and  oriented x4;  grossly normal neurologically. Skin:  Skin feels warm to the touch, no noted fever in the past 24 hours.  Cervical Nodes:  No significant cervical adenopathy. Psych:  Alert and cooperative. Normal mood and affect.  Intake/Output from previous day: 11/01 0701 - 11/02 0700 In: 720 [P.O.:720] Out: -  Intake/Output this shift: Total I/O In: 480 [P.O.:480] Out: 700 [Urine:700]  Lab Results:  Recent Labs  05/21/16 0924 05/22/16 0613 05/23/16 0636  WBC 11.1* 9.2 8.1  HGB 11.6* 10.7* 9.8*  HCT 34.6* 32.2* 29.1*  PLT 266 236 184   BMET  Recent Labs  05/21/16 0924 05/22/16 0613 05/23/16 0636  NA 126* 127* 128*  K 2.6*  3.3* 3.1*  CL 80* 83* 85*  CO2 35* 37* 37*  GLUCOSE 184* 113* 111*  BUN 17 16 14   CREATININE 0.70 0.73 0.69  CALCIUM 8.5* 8.3* 8.2*   LFT  Recent Labs  05/21/16 0924  PROT 8.2*  ALBUMIN 2.2*  AST 56*  ALT 20  ALKPHOS 78  BILITOT 3.4*   PT/INR  Recent Labs  05/21/16 0924 05/22/16 0613  LABPROT 17.3* 18.5*  INR 1.40 1.52   Hepatitis Panel  Recent Labs  05/21/16 1556  HEPBSAG Negative  HCVAB 0.3  HEPAIGM Negative  HEPBIGM Negative     Studies/Results: Ct Abdomen Pelvis W Contrast  Result Date: 05/21/2016 CLINICAL DATA:  Severe abdominal distention.  Shortness of breath. EXAM: CT ABDOMEN AND PELVIS WITH CONTRAST TECHNIQUE: Multidetector CT imaging of the abdomen and pelvis was performed using the standard protocol following bolus administration of intravenous contrast. CONTRAST:  170mL ISOVUE-300 IOPAMIDOL (ISOVUE-300) INJECTION 61% COMPARISON:  None. FINDINGS: Lower chest: There is right lower lobe atelectasis/consolidation. No pleural effusion. Hepatobiliary: The liver is shrunken with a nodular contour. There is large volume ascites throughout the entire abdomen and pelvis. Small caliber recannulized umbilical vein. Pancreas: Pancreatic contours and enhancement are normal. Spleen: There are multiple areas of splenic hypoattenuation. The spleen is not enlarged. Adrenals/Urinary Tract: The adrenal glands are normal. Both kidneys are normal. Stomach/Bowel: No dilated small bowel or other evidence of obstruction. No enteric or colonic inflammation. The appendix is normal. No pneumobilia or portal venous gas. Vascular/Lymphatic: There is atherosclerotic calcification of the non  aneurysmal abdominal aorta. The portal vein, superior mesenteric vein and splenic vein are patent. No abdominal or pelvic lymphadenopathy identified. Reproductive: Prostate and seminal vesicles are unremarkable. Other: None Musculoskeletal: No bony spinal canal stenosis. No lytic or blastic osseous  lesions. IMPRESSION: 1. Large amount of abdominal/pelvic ascites. 2. Nodular hepatic contour, suggesting hepatic cirrhosis. 3. Aortic atherosclerosis. 4. Right basilar atelectasis/consolidation. 5. Multiple areas of spine hypoattenuation may be small splenic infarcts related to underlying portal hypertension. Electronically Signed   By: Ulyses Jarred M.D.   On: 05/21/2016 19:48   US Paracentesis  Result Date: 05/22/2016 INDICATION: Ascites, cirrhosis EXAM: ULTRASOUND GUIDED DIAGNOSTIC AND THERAPEUTIC PARACENTESIS MEDICATIONS: None. COMPLICATIONS: None immediate. PROCEDURE: Procedure, benefits, and risks of procedure were discussed with patient. Written informed consent for procedure was obtained. Time out protocol followed. Adequate collection of ascites localized by ultrasound in RIGHT lower quadrant. Skin prepped and draped in usual sterile fashion. Skin and soft tissues anesthetized with 10 mL of 1% lidocaine. 5 [place] Yueh catheter placed into peritoneal cavity. 4.18 L of clear yellow fluid fluid aspirated by vacuum bottle suction. Procedure tolerated well by patient without immediate complication. FINDINGS: A total of approximately 4.18 L of ascitic fluid was removed. Samples were sent to the laboratory as requested by the clinical team. IMPRESSION: Successful ultrasound-guided paracentesis yielding 4.18 liters of peritoneal fluid. Electronically Signed   By: Lavonia Dana M.D.   On: 05/22/2016 12:40    Assessment: 55 year old male with history of ETOH abuse and reported history of cirrhosis (MELD Na 24), presenting with decompensated features including ascites, anasarca, electrolyte abnormalities. Discriminant function calculated at 30; therefore prednisolone not indicated at this time. Viral markers negative. CT abd/pelvis yesterday with large amount of ascites, nodular hepatic contour suggesting cirrhosis, spleen normal in size but multiple areas of ?splenic infarcts ?related to underlying portal  hypertension. Encouragingly, he has abstained from alcohol from a month.  Tense ascites: LVAP completed yesterday along with fluid analysis to include cytology and AFB culture. A total of 4L was removed. Final labs pending but initial labs do not indicate SBP.  Routine cirrhosis care can be undertaken as outpatient to include variceal screening, serial ultrasounds, vaccinations, screening colonoscopy.  Patient's weights yesterday noted to be inaccurate with 30 lb difference recorded in 5 hours. Recommended continue daily weights and strict I/Os.   Today his labs show sodium low/stable at 128, kcl low 3.1, Cr normal. CBC with low hgb 9.8, normal plt count and he seems to be clinically improved after LVAP. Electrolytes still abnormal. He seems to have mild asterixis on exam today, but mental status seems intact. Not on non-selective beta blocker currently and HR today in the 90s but no record of EGD available and no known history of varices.  No indication for non-selective beta blocker at this time as research shows no definitive benefit prior to development of varices.   Plan: 1. Requested staff to check temp and page me if febrile 2. HFP, CBC, ammonia tomorrow 3. Monitor for recurrent ascites 4. Continued diuresis per hospitalist 5. Supportive measures 6. Monitor for GI bleed 7. Electrolyte replacement per hospitalist   Thank you for allowing [place] to participate in the care of [place], DNP, AGNP-C Adult & Gerontological Nurse Practitioner [facility]    LOS: 2 days    05/23/2016, 1:14 PM

## 2016-05-23 NOTE — Progress Notes (Signed)
PROGRESS NOTE    Thomas Pratt  [id number] DOB: [birth year] DOA: 05/21/2016 PCP: No PCP Per Patient    Brief Narrative:  46 yom who hardly ever sees a physician with a history of hypertension and a recent clinic visit who was being treated with aldactone and lasix, presents with complaints of worsening abdominal distention. He is a recovering alcoholic of one month. While in the ED, he was noted to be severely hypokalemic, anemic, and hyponatremic with leukocytosis. His albumin was also noted to be low at 2.2 and ammonia 49. He was started on IV potassium supplementation and IV fluids and admitted for further evaluation of alcoholic cirrhosis of the liver.   Assessment & Plan:   Active Problems:   Liver cirrhosis (HCC)   Hypoalbuminemia   Hyponatremia   Hypokalemia   HTN (hypertension)   Ascites   ALC (alcoholic liver cirrhosis) (HCC)   Lower extremity edema   Malnutrition of moderate degree  1. Hepatic cirrhosis. This is likely due to alcohol. Patient is a recovering alcoholic of one month. Abdominal CT with contrast shows a large amount of ascites and nodular hepatic contour. Will continue lasix and Aldactone due to volume overload and low sodium. GI was consulted and recommended a US paracentesis. Patient had paracentesis done 11/1 with removal of 4L of fluid. Fluid analysis doesn't indicate SBP. 2. Hypokalemia. Replaced.  3. Hyponatremia. IV fluids have been held due to volume overload. Continue lasix. Slowly improving 4. Hypoalbuminemia. Likely related to liver disease.   5. Macrocytic anemia. Hemoglobin was 11.6 on admission, now 9.8, trending down. There are no active signs of bleeding at this time. Likely related to alcohol consumption. Continue to follow.  6. Leukocytosis. White blood cell count was 11,100 on admission, now within normal limits.   DVT prophylaxis: Heparin injection  Code Status: FULL  Family Communication: no family present Disposition Plan:  Discharge home once improved.    Consultants:   Gastroenterology  Radiology  Nutrition  Procedures:   Paracentesis 11/1 with removal of 4L  Antimicrobials:   None    Subjective: Feeling better. Abdomen is less distended. No shortness of breath  Objective: Vitals:   05/22/16 1605 05/22/16 1900 05/22/16 2041 05/22/16 2300  BP: (!) 92/48   (!) 113/56  Pulse: (!) 102   96  Resp: 18   (!) 21  Temp: 98.5 F (36.9 C)   98 F (36.7 C)  TempSrc: Oral   Oral  SpO2: 91%  (!) 87% 94%  Weight:  81.8 kg (180 lb 6.4 oz)    Height:        Intake/Output Summary (Last 24 hours) at 05/23/16 0718 Last data filed at 05/22/16 1900  Gross per 24 hour  Intake              720 ml  Output                0 ml  Net              720 ml   Filed Weights   05/21/16 0901 05/21/16 1615 05/22/16 1900  Weight: 72.6 kg (160 lb) 86.6 kg (191 lb) 81.8 kg (180 lb 6.4 oz)    Examination:   General exam: Appears calm and comfortable  Respiratory system: Clear to auscultation. Respiratory effort normal. Cardiovascular system: S1 & S2 heard, RRR. No JVD, murmurs, rubs, gallops or clicks. 1-2+ pedal edema. Gastrointestinal system: Abdomen is distended, soft and nontender. No organomegaly or masses felt. Normal bowel  sounds heard. Central nervous system: Alert and oriented. No focal neurological deficits. Extremities: Symmetric 5 x 5 power. Skin: No rashes, lesions or ulcers Psychiatry: Judgement and insight appear normal. Mood & affect appropriate.     Data Reviewed: I have personally reviewed following labs and imaging studies  CBC:  Recent Labs Lab 05/21/16 0924 05/22/16 0613 05/23/16 0636  WBC 11.1* 9.2 8.1  NEUTROABS 9.2*  --   --   HGB 11.6* 10.7* 9.8*  HCT 34.6* 32.2* 29.1*  MCV 103.6* 102.9* 101.7*  PLT 266 236 [id number]   Basic Metabolic Panel:  Recent Labs Lab 05/21/16 0924 05/22/16 0613 05/23/16 0636  NA 126* 127* 128*  K 2.6* 3.3* 3.1*  CL 80* 83* 85*  CO2 35* 37* 37*   GLUCOSE 184* 113* 111*  BUN 17 16 14   CREATININE 0.70 0.73 0.69  CALCIUM 8.5* 8.3* 8.2*  MG 1.8  --   --    GFR: Estimated Creatinine Clearance: 99.8 mL/min (by C-G formula based on SCr of 0.69 mg/dL). Liver Function Tests:  Recent Labs Lab 05/21/16 0924  AST 56*  ALT 20  ALKPHOS 78  BILITOT 3.4*  PROT 8.2*  ALBUMIN 2.2*    Recent Labs Lab 05/21/16 0924  LIPASE 39    Recent Labs Lab 05/21/16 1556  AMMONIA 49*   Coagulation Profile:  Recent Labs Lab 05/21/16 0924 05/22/16 0613  INR 1.40 1.52   Cardiac Enzymes: No results for input(s): CKTOTAL, CKMB, CKMBINDEX, TROPONINI in the last 168 hours. BNP (last 3 results) No results for input(s): PROBNP in the last 8760 hours. HbA1C: No results for input(s): HGBA1C in the last 72 hours. CBG: No results for input(s): GLUCAP in the last 168 hours. Lipid Profile: No results for input(s): CHOL, HDL, LDLCALC, TRIG, CHOLHDL, LDLDIRECT in the last 72 hours. Thyroid Function Tests: No results for input(s): TSH, T4TOTAL, FREET4, T3FREE, THYROIDAB in the last 72 hours. Anemia Panel: No results for input(s): VITAMINB12, FOLATE, FERRITIN, TIBC, IRON, RETICCTPCT in the last 72 hours. Sepsis Labs: No results for input(s): PROCALCITON, LATICACIDVEN in the last 168 hours.  Recent Results (from the past 240 hour(s))  Gram stain     Status: None   Collection Time: 05/22/16 11:30 AM  Result Value Ref Range Status   Specimen Description FLUID ASCITIC COLLECTED BY DOCTOR  Final   Special Requests NONE  Final   Gram Stain   Final    CYTOSPIN SMEAR WBC PRESENT, PREDOMINANTLY MONONUCLEAR NO ORGANISMS SEEN Performed at [facility]    Report Status 05/22/2016 FINAL  Final         Radiology Studies: Dg Chest 2 View  Result Date: 05/21/2016 CLINICAL DATA:  Lower extremity edema with abdominal distention and dyspnea with exertion EXAM: CHEST  2 VIEW COMPARISON:  May 28, 2014 FINDINGS: Degree of inspiration is  quite shallow. There is bibasilar atelectasis. Lungs elsewhere are clear. Heart size and pulmonary vascularity are normal. No adenopathy. No bone lesions. IMPRESSION: Shallow degree of inspiration. Bibasilar atelectasis. No edema or consolidation. Cardiac silhouette within normal limits. Electronically Signed   By: Lowella Grip III M.D.   On: 05/21/2016 10:24   Ct Abdomen Pelvis W Contrast  Result Date: 05/21/2016 CLINICAL DATA:  Severe abdominal distention.  Shortness of breath. EXAM: CT ABDOMEN AND PELVIS WITH CONTRAST TECHNIQUE: Multidetector CT imaging of the abdomen and pelvis was performed using the standard protocol following bolus administration of intravenous contrast. CONTRAST:  168mL ISOVUE-300 IOPAMIDOL (ISOVUE-300) INJECTION 61% COMPARISON:  None.  FINDINGS: Lower chest: There is right lower lobe atelectasis/consolidation. No pleural effusion. Hepatobiliary: The liver is shrunken with a nodular contour. There is large volume ascites throughout the entire abdomen and pelvis. Small caliber recannulized umbilical vein. Pancreas: Pancreatic contours and enhancement are normal. Spleen: There are multiple areas of splenic hypoattenuation. The spleen is not enlarged. Adrenals/Urinary Tract: The adrenal glands are normal. Both kidneys are normal. Stomach/Bowel: No dilated small bowel or other evidence of obstruction. No enteric or colonic inflammation. The appendix is normal. No pneumobilia or portal venous gas. Vascular/Lymphatic: There is atherosclerotic calcification of the non aneurysmal abdominal aorta. The portal vein, superior mesenteric vein and splenic vein are patent. No abdominal or pelvic lymphadenopathy identified. Reproductive: Prostate and seminal vesicles are unremarkable. Other: None Musculoskeletal: No bony spinal canal stenosis. No lytic or blastic osseous lesions. IMPRESSION: 1. Large amount of abdominal/pelvic ascites. 2. Nodular hepatic contour, suggesting hepatic cirrhosis. 3.  Aortic atherosclerosis. 4. Right basilar atelectasis/consolidation. 5. Multiple areas of spine hypoattenuation may be small splenic infarcts related to underlying portal hypertension. Electronically Signed   By: Ulyses Jarred M.D.   On: 05/21/2016 19:48   US Paracentesis  Result Date: 05/22/2016 INDICATION: Ascites, cirrhosis EXAM: ULTRASOUND GUIDED DIAGNOSTIC AND THERAPEUTIC PARACENTESIS MEDICATIONS: None. COMPLICATIONS: None immediate. PROCEDURE: Procedure, benefits, and risks of procedure were discussed with patient. Written informed consent for procedure was obtained. Time out protocol followed. Adequate collection of ascites localized by ultrasound in RIGHT lower quadrant. Skin prepped and draped in usual sterile fashion. Skin and soft tissues anesthetized with 10 mL of 1% lidocaine. 5 [place] Yueh catheter placed into peritoneal cavity. 4.18 L of clear yellow fluid fluid aspirated by vacuum bottle suction. Procedure tolerated well by patient without immediate complication. FINDINGS: A total of approximately 4.18 L of ascitic fluid was removed. Samples were sent to the laboratory as requested by the clinical team. IMPRESSION: Successful ultrasound-guided paracentesis yielding 4.18 liters of peritoneal fluid. Electronically Signed   By: Lavonia Dana M.D.   On: 05/22/2016 12:40        Scheduled Meds: . feeding supplement (ENSURE ENLIVE)  237 mL Oral BID BM  . furosemide  40 mg Intravenous BID  . heparin  5,000 Units Subcutaneous Q8H  . sodium chloride flush  3 mL Intravenous Q12H  . spironolactone  50 mg Oral Daily   Continuous Infusions:    LOS: 2 days    Time spent: 25 minutes    Kathie Dike, MD [facility] If 7PM-7AM, please contact night-coverage [email or web address] Password [facility] 05/23/2016, 7:18 AM

## 2016-05-23 NOTE — Care Management Note (Signed)
Case Management Note  Patient Details  Name: Thomas Pratt MRN: [id number] Date of Birth: 1961-02-04  Subjective/Objective:  Patient adm ascites. Paracentesis yesterday on 05/22/2016. He states he does not have a PCP. He lives with "roommates". He has transportation and insurance and ind with ADL's.             Action/Plan: Patient given list of PCP accepting new patients. Anticipate DC home with self care.    Expected Discharge Date:          05/24/2016        Expected Discharge Plan:  Home/Self Care  In-House Referral:  NA  Discharge planning Services  CM Consult  Post Acute Care Choice:  NA Choice offered to:  NA  DME Arranged:    DME Agency:     HH Arranged:    HH Agency:     Status of Service:  In process, will continue to follow  If discussed at Long Length of Stay Meetings, dates discussed:    Additional Comments:  Thomas Pratt, Chauncey Reading, RN 05/23/2016, 11:26 AM

## 2016-05-24 LAB — HEPATIC FUNCTION PANEL
ALBUMIN: 2 g/dL — AB (ref 3.5–5.0)
ALK PHOS: 62 U/L (ref 38–126)
ALT: 16 U/L — AB (ref 17–63)
AST: 44 U/L — AB (ref 15–41)
BILIRUBIN DIRECT: 0.9 mg/dL — AB (ref 0.1–0.5)
BILIRUBIN TOTAL: 2.4 mg/dL — AB (ref 0.3–1.2)
Indirect Bilirubin: 1.5 mg/dL — ABNORMAL HIGH (ref 0.3–0.9)
Total Protein: 6.7 g/dL (ref 6.5–8.1)

## 2016-05-24 LAB — CBC
HEMATOCRIT: 27.9 % — AB (ref 39.0–52.0)
HEMOGLOBIN: 9.4 g/dL — AB (ref 13.0–17.0)
MCH: 34.2 pg — ABNORMAL HIGH (ref 26.0–34.0)
MCHC: 33.7 g/dL (ref 30.0–36.0)
MCV: 101.5 fL — AB (ref 78.0–100.0)
Platelets: 191 10*3/uL (ref 150–400)
RBC: 2.75 MIL/uL — AB (ref 4.22–5.81)
RDW: 15.1 % (ref 11.5–15.5)
WBC: 8.7 10*3/uL (ref 4.0–10.5)

## 2016-05-24 LAB — BASIC METABOLIC PANEL
ANION GAP: 3 — AB (ref 5–15)
BUN: 11 mg/dL (ref 6–20)
CHLORIDE: 88 mmol/L — AB (ref 101–111)
CO2: 36 mmol/L — ABNORMAL HIGH (ref 22–32)
Calcium: 8 mg/dL — ABNORMAL LOW (ref 8.9–10.3)
Creatinine, Ser: 0.59 mg/dL — ABNORMAL LOW (ref 0.61–1.24)
GFR calc Af Amer: >60 mL/min (ref >60)
Glucose, Bld: 105 mg/dL — ABNORMAL HIGH (ref 65–99)
POTASSIUM: 3.5 mmol/L (ref 3.5–5.1)
SODIUM: 127 mmol/L — AB (ref 135–145)

## 2016-05-24 LAB — AMMONIA: Ammonia: 41 umol/L — ABNORMAL HIGH (ref 9–35)

## 2016-05-24 MED ORDER — ALBUMIN HUMAN 25 % IV SOLN
INTRAVENOUS | Status: AC
Start: 2016-05-24 — End: 2016-05-24
  Filled 2016-05-24: qty 100

## 2016-05-24 MED ORDER — ALBUMIN HUMAN 25 % IV SOLN
25.0000 g | Freq: Two times a day (BID) | INTRAVENOUS | Status: DC
  Administered 2016-05-24: 25 g via INTRAVENOUS
  Filled 2016-05-24 (×2): qty 100

## 2016-05-24 NOTE — Progress Notes (Signed)
PROGRESS NOTE    Thomas Pratt  [id number] DOB: [birth year] DOA: 05/21/2016 PCP: No PCP Per Patient   Brief Narrative:  39 yom who hardly ever sees a physician with a history of hypertension and a recent clinic visit who was being treated with aldactone and lasix, presents with complaints of worsening abdominal distention. He is a recovering alcoholic of one month. While in the ED, he was noted to be severely hypokalemic, anemic, and hyponatremic with leukocytosis. His albumin was also noted to be low at 2.2 and ammonia 49. He was started on IV potassium supplementation and IV fluids and admitted for further evaluation of alcoholic cirrhosis of the liver.   Assessment & Plan:   Active Problems:   Liver cirrhosis (HCC)   Hypoalbuminemia   Hyponatremia   Hypokalemia   HTN (hypertension)   Ascites   ALC (alcoholic liver cirrhosis) (HCC)   Lower extremity edema   Malnutrition of moderate degree  1. Hepatic cirrhosis. This is likely due to alcohol. Patient is a recovering alcoholic of one month. Abdominal CT with contrast shows a large amount of ascites and nodular hepatic contour. Will continue lasix and Aldactone due to volume overload and low sodium. GI was consulted and recommended a US paracentesis. Patient had paracentesis done 11/1 with removal of 4L of fluid. Fluid analysis doesn't indicate SBP. 2. Anasarca. Related to hypoalbuminemia and liver disease. Still has significant volume overload. Continue with intravenous diuresis. Will add albumin infusion to help with diuresis 3. Hypokalemia. Replaced.  4. Hypervolemic Hyponatremia. Continue lasix, slowly improving 5. Hypoalbuminemia. Likely related to liver disease.   6. Macrocytic anemia. Hemoglobin was 11.6 on admission, now 9.4, trending down. There are no active signs of bleeding at this time. Likely related to alcohol consumption. Continue to follow.  7. Leukocytosis. White blood cell count was 11,100 on admission, now  within normal limits.   DVT prophylaxis: Heparin injection  Code Status: FULL  Family Communication: discussed with son. Left VM for daughter Disposition Plan: Discharge home once improved.    Consultants:   GI   Radiology   Nutrition   Procedures:  Paracentesis 11/1 with removal of 4L  Antimicrobials:   None    Subjective: Feeling better. Abdomen does not feel as distended  Objective: Vitals:   05/23/16 1416 05/23/16 1843 05/23/16 2200 05/24/16 0540  BP: (!) 118/59  109/63 (!) 103/59  Pulse: 70  92 93  Resp: 20  18 18   Temp: 98.4 F (36.9 C)  98.7 F (37.1 C) 98.5 F (36.9 C)  TempSrc: Oral  Oral Oral  SpO2: 95%  93% 94%  Weight:  81.6 kg (179 lb 14.4 oz)  80.6 kg (177 lb 9.6 oz)  Height:        Intake/Output Summary (Last 24 hours) at 05/24/16 0806 Last data filed at 05/24/16 0544  Gross per 24 hour  Intake              963 ml  Output             1750 ml  Net             -787 ml   Filed Weights   05/22/16 1900 05/23/16 1843 05/24/16 0540  Weight: 81.8 kg (180 lb 6.4 oz) 81.6 kg (179 lb 14.4 oz) 80.6 kg (177 lb 9.6 oz)    Examination:  General exam: Appears calm and comfortable  Respiratory system: Clear to auscultation. Respiratory effort normal. Cardiovascular system: S1 & S2 heard, RRR. No  JVD, murmurs, rubs, gallops or clicks. 2+ pedal edema. Gastrointestinal system: Abdomen is mildly distended, soft and nontender. No organomegaly or masses felt. Normal bowel sounds heard. Central nervous system: Alert and oriented. No focal neurological deficits. Extremities: Symmetric 5 x 5 power. Skin: No rashes, lesions or ulcers Psychiatry: Judgement and insight appear normal. Mood & affect appropriate.     Data Reviewed: I have personally reviewed following labs and imaging studies  CBC:  Recent Labs Lab 05/21/16 0924 05/22/16 0613 05/23/16 0636 05/24/16 0638  WBC 11.1* 9.2 8.1 8.7  NEUTROABS 9.2*  --   --   --   HGB 11.6* 10.7* 9.8* 9.4*    HCT 34.6* 32.2* 29.1* 27.9*  MCV 103.6* 102.9* 101.7* 101.5*  PLT 266 236 184 [id number]   Basic Metabolic Panel:  Recent Labs Lab 05/21/16 0924 05/22/16 0613 05/23/16 0636 05/24/16 0638  NA 126* 127* 128* 127*  K 2.6* 3.3* 3.1* 3.5  CL 80* 83* 85* 88*  CO2 35* 37* 37* 36*  GLUCOSE 184* 113* 111* 105*  BUN 17 16 14 11   CREATININE 0.70 0.73 0.69 0.59*  CALCIUM 8.5* 8.3* 8.2* 8.0*  MG 1.8  --   --   --    GFR: Estimated Creatinine Clearance: 99.8 mL/min (by C-G formula based on SCr of 0.59 mg/dL (L)). Liver Function Tests:  Recent Labs Lab 05/21/16 0924 05/24/16 0638  AST 56* 44*  ALT 20 16*  ALKPHOS 78 62  BILITOT 3.4* 2.4*  PROT 8.2* 6.7  ALBUMIN 2.2* 2.0*    Recent Labs Lab 05/21/16 0924  LIPASE 39    Recent Labs Lab 05/21/16 1556 05/24/16 0638  AMMONIA 49* 41*   Coagulation Profile:  Recent Labs Lab 05/21/16 0924 05/22/16 0613  INR 1.40 1.52   Cardiac Enzymes: No results for input(s): CKTOTAL, CKMB, CKMBINDEX, TROPONINI in the last 168 hours. BNP (last 3 results) No results for input(s): PROBNP in the last 8760 hours. HbA1C: No results for input(s): HGBA1C in the last 72 hours. CBG: No results for input(s): GLUCAP in the last 168 hours. Lipid Profile: No results for input(s): CHOL, HDL, LDLCALC, TRIG, CHOLHDL, LDLDIRECT in the last 72 hours. Thyroid Function Tests: No results for input(s): TSH, T4TOTAL, FREET4, T3FREE, THYROIDAB in the last 72 hours. Anemia Panel: No results for input(s): VITAMINB12, FOLATE, FERRITIN, TIBC, IRON, RETICCTPCT in the last 72 hours. Sepsis Labs: No results for input(s): PROCALCITON, LATICACIDVEN in the last 168 hours.  Recent Results (from the past 240 hour(s))  Gram stain     Status: None   Collection Time: 05/22/16 11:30 AM  Result Value Ref Range Status   Specimen Description FLUID ASCITIC COLLECTED BY DOCTOR  Final   Special Requests NONE  Final   Gram Stain   Final    CYTOSPIN SMEAR WBC PRESENT,  PREDOMINANTLY MONONUCLEAR NO ORGANISMS SEEN Performed at [facility]    Report Status 05/22/2016 FINAL  Final  Culture, body fluid-bottle     Status: None (Preliminary result)   Collection Time: 05/22/16 11:30 AM  Result Value Ref Range Status   Specimen Description FLUID ASCITIC COLLECTED BY DOCTOR  Final   Special Requests BOTTLES DRAWN AEROBIC AND ANAEROBIC 10CC  Final   Culture NO GROWTH < 24 HOURS  Final   Report Status PENDING  Incomplete         Radiology Studies: US Paracentesis  Result Date: 05/22/2016 INDICATION: Ascites, cirrhosis EXAM: ULTRASOUND GUIDED DIAGNOSTIC AND THERAPEUTIC PARACENTESIS MEDICATIONS: None. COMPLICATIONS: None immediate. PROCEDURE: Procedure,  benefits, and risks of procedure were discussed with patient. Written informed consent for procedure was obtained. Time out protocol followed. Adequate collection of ascites localized by ultrasound in RIGHT lower quadrant. Skin prepped and draped in usual sterile fashion. Skin and soft tissues anesthetized with 10 mL of 1% lidocaine. 5 [place] Yueh catheter placed into peritoneal cavity. 4.18 L of clear yellow fluid fluid aspirated by vacuum bottle suction. Procedure tolerated well by patient without immediate complication. FINDINGS: A total of approximately 4.18 L of ascitic fluid was removed. Samples were sent to the laboratory as requested by the clinical team. IMPRESSION: Successful ultrasound-guided paracentesis yielding 4.18 liters of peritoneal fluid. Electronically Signed   By: Lavonia Dana M.D.   On: 05/22/2016 12:40        Scheduled Meds: . feeding supplement (ENSURE ENLIVE)  237 mL Oral BID BM  . furosemide  40 mg Intravenous BID  . heparin  5,000 Units Subcutaneous Q8H  . sodium chloride flush  3 mL Intravenous Q12H  . spironolactone  50 mg Oral Daily   Continuous Infusions:    LOS: 3 days    Time spent: 25 minutes     Kathie Dike, MD [facility] If 7PM-7AM, please contact  night-coverage [email or web address] Password [email or web address] 05/24/2016, 8:06 AM

## 2016-05-24 NOTE — Progress Notes (Signed)
Subjective: Feels better today. No worsening abdominal swelling. Acknowledges need for low salt diet and ETOH avoidance. No abdominal pain, N/V. No other GI complaints.  Objective: Vital signs in last 24 hours: Temp:  [98.4 F (36.9 C)-98.7 F (37.1 C)] 98.5 F (36.9 C) (11/03 0540) Pulse Rate:  [70-93] 93 (11/03 0540) Resp:  [18-20] 18 (11/03 0540) BP: (103-118)/(59-63) 103/59 (11/03 0540) SpO2:  [93 %-95 %] 94 % (11/03 0540) Weight:  [177 lb 9.6 oz (80.6 kg)-179 lb 14.4 oz (81.6 kg)] 177 lb 9.6 oz (80.6 kg) (11/03 0540) Last BM Date: 05/22/16 General:   Alert and oriented, pleasant Head:  Normocephalic and atraumatic. Eyes:  No icterus, sclera clear. Conjuctiva pink.  Heart:  S1, S2 present, no murmurs noted.  Lungs: Clear to auscultation bilaterally, without wheezing, rales, or rhonchi.  Abdomen:  Bowel sounds present, non-tender. Abdomen distended and firm but not tense. No rebound or guarding. Msk:  Symmetrical without gross deformities. Pulses:  Normal bilateral DP pulses noted. Extremities:  Without clubbing. Continues with 2-3+ bilateral LE edema with edema extending up to mid-thigh as 1+ edema. Neurologic:  Alert and  oriented x4;  grossly normal neurologically. Psych:  Alert and cooperative. Normal mood and affect.  Intake/Output from previous day: 11/02 0701 - 11/03 0700 In: 963 [P.O.:960; I.V.:3] Out: 1750 [Urine:1750] Intake/Output this shift: No intake/output data recorded.  Lab Results:  Recent Labs  05/22/16 0613 05/23/16 0636 05/24/16 0638  WBC 9.2 8.1 8.7  HGB 10.7* 9.8* 9.4*  HCT 32.2* 29.1* 27.9*  PLT 236 184 191   BMET  Recent Labs  05/22/16 0613 05/23/16 0636 05/24/16 0638  NA 127* 128* 127*  K 3.3* 3.1* 3.5  CL 83* 85* 88*  CO2 37* 37* 36*  GLUCOSE 113* 111* 105*  BUN 16 14 11   CREATININE 0.73 0.69 0.59*  CALCIUM 8.3* 8.2* 8.0*   LFT  Recent Labs  05/24/16 0638  PROT 6.7  ALBUMIN 2.0*  AST 44*  ALT 16*  ALKPHOS 62   BILITOT 2.4*  BILIDIR 0.9*  IBILI 1.5*   PT/INR  Recent Labs  05/22/16 0613  LABPROT 18.5*  INR 1.52   Hepatitis Panel  Recent Labs  05/21/16 1556  HEPBSAG Negative  HCVAB 0.3  HEPAIGM Negative  HEPBIGM Negative     Studies/Results: US Paracentesis  Result Date: 05/22/2016 INDICATION: Ascites, cirrhosis EXAM: ULTRASOUND GUIDED DIAGNOSTIC AND THERAPEUTIC PARACENTESIS MEDICATIONS: None. COMPLICATIONS: None immediate. PROCEDURE: Procedure, benefits, and risks of procedure were discussed with patient. Written informed consent for procedure was obtained. Time out protocol followed. Adequate collection of ascites localized by ultrasound in RIGHT lower quadrant. Skin prepped and draped in usual sterile fashion. Skin and soft tissues anesthetized with 10 mL of 1% lidocaine. 5 [place] Yueh catheter placed into peritoneal cavity. 4.18 L of clear yellow fluid fluid aspirated by vacuum bottle suction. Procedure tolerated well by patient without immediate complication. FINDINGS: A total of approximately 4.18 L of ascitic fluid was removed. Samples were sent to the laboratory as requested by the clinical team. IMPRESSION: Successful ultrasound-guided paracentesis yielding 4.18 liters of peritoneal fluid. Electronically Signed   By: Lavonia Dana M.D.   On: 05/22/2016 12:40    Assessment: 55 year old male with history of ETOH abuse and reported history of cirrhosis (MELD Na 24), presenting with decompensated features including ascites, anasarca, electrolyte abnormalities. Discriminant function calculated at 30; therefore prednisolone not indicated at this time. Viral markers negative. CT abd/pelvis yesterday with large amount of ascites, nodular hepatic  contour suggesting cirrhosis, spleen normal in size but multiple areas of ?splenic infarcts ?related to underlying portal hypertension. Encouragingly, he has abstained from alcohol from a month.  Tense ascites: LVAP completed yesterday along with  fluid analysis to include cytology and AFB culture. A total of 4L was removed. Final labs pending but initial labs do not indicate SBP.  Routine cirrhosis care can be undertaken as outpatient to include variceal screening, serial ultrasounds, vaccinations, screening colonoscopy.  Patient's weight down 2 lb today from yesterday. I&O with -787 yesterday.  Today his labs show sodium low/stable at 127, potassium back to normal at 3.5 (from 3.1 yesterday), Cr normal. CBC with low hgb 9.4 (down from 9.8 yesterday) without overt bleeding noted, normal plt count. HFP with improvement in AST/ALT, bili improved to 2.4 (from 3.4 yesterday), albumin remains low at 2.0. Remains hyponatremic, low albumin, anemia likely from ETOH cirrhosis. He seemed to have mild asterixis on exam yesterday, but mental status seemed intact and ammonia was stable from admission at 41 (mild elevation).  Today he is essentially unchanged to some clinical improvement. Acknowledges need for low sodium diet. No interest in drinking any more. Does not feel like he is having worsening edema/abdominal swelling. Negative net I&O encouraging for his diuresis and losing weight. No other changes. Will likely need a couple more days of diuresis and outpatient follow-up as outlined above.  Plan: 1. Continued diuresis with monitoring of Cr 2. Check MELD score tomorrow (HFP, BMP, INR) 3. Monitor for bleeding 4. Monitor reaccumulation of ascites 5. Will follow-up as outpatient for routine care 6. Supportive measures   Thank you for allowing [place] to participate in the care of [facility]  Walden Field, DNP, AGNP-C Adult & Gerontological Nurse Practitioner [facility]    LOS: 3 days    05/24/2016, 10:28 AM

## 2016-05-25 DIAGNOSIS — E440 Moderate protein-calorie malnutrition: Secondary | ICD-10-CM

## 2016-05-25 DIAGNOSIS — K7011 Alcoholic hepatitis with ascites: Secondary | ICD-10-CM

## 2016-05-25 DIAGNOSIS — R600 Localized edema: Secondary | ICD-10-CM

## 2016-05-25 DIAGNOSIS — E8809 Other disorders of plasma-protein metabolism, not elsewhere classified: Secondary | ICD-10-CM

## 2016-05-25 DIAGNOSIS — K7031 Alcoholic cirrhosis of liver with ascites: Secondary | ICD-10-CM

## 2016-05-25 DIAGNOSIS — E876 Hypokalemia: Secondary | ICD-10-CM

## 2016-05-25 DIAGNOSIS — E871 Hypo-osmolality and hyponatremia: Secondary | ICD-10-CM

## 2016-05-25 LAB — BASIC METABOLIC PANEL
ANION GAP: 5 (ref 5–15)
BUN: 10 mg/dL (ref 6–20)
CALCIUM: 8.3 mg/dL — AB (ref 8.9–10.3)
CHLORIDE: 88 mmol/L — AB (ref 101–111)
CO2: 34 mmol/L — AB (ref 22–32)
Creatinine, Ser: 0.66 mg/dL (ref 0.61–1.24)
GFR calc non Af Amer: >60 mL/min (ref >60)
Glucose, Bld: 106 mg/dL — ABNORMAL HIGH (ref 65–99)
Potassium: 3.5 mmol/L (ref 3.5–5.1)
Sodium: 127 mmol/L — ABNORMAL LOW (ref 135–145)

## 2016-05-25 LAB — HEPATIC FUNCTION PANEL
ALBUMIN: 2.1 g/dL — AB (ref 3.5–5.0)
ALT: 16 U/L — ABNORMAL LOW (ref 17–63)
AST: 42 U/L — AB (ref 15–41)
Alkaline Phosphatase: 67 U/L (ref 38–126)
BILIRUBIN TOTAL: 2.2 mg/dL — AB (ref 0.3–1.2)
Bilirubin, Direct: 0.9 mg/dL — ABNORMAL HIGH (ref 0.1–0.5)
Indirect Bilirubin: 1.3 mg/dL — ABNORMAL HIGH (ref 0.3–0.9)
Total Protein: 6.6 g/dL (ref 6.5–8.1)

## 2016-05-25 LAB — PROTIME-INR
INR: 1.39
Prothrombin Time: 17.2 seconds — ABNORMAL HIGH (ref 11.4–15.2)

## 2016-05-25 MED ORDER — FUROSEMIDE 10 MG/ML IJ SOLN
40.0000 mg | Freq: Every day | INTRAMUSCULAR | Status: DC
  Administered 2016-05-25: 40 mg via INTRAVENOUS
  Filled 2016-05-25: qty 4

## 2016-05-25 MED ORDER — LACTULOSE 10 GM/15ML PO SOLN
10.0000 g | Freq: Two times a day (BID) | ORAL | Status: DC
  Administered 2016-05-25 – 2016-06-01 (×15): 10 g via ORAL
  Filled 2016-05-25 (×15): qty 30

## 2016-05-25 MED ORDER — ALBUMIN HUMAN 25 % IV SOLN
50.0000 g | Freq: Every day | INTRAVENOUS | Status: DC
  Administered 2016-05-25 – 2016-05-26 (×2): 50 g via INTRAVENOUS
  Filled 2016-05-25 (×3): qty 200

## 2016-05-25 MED ORDER — SPIRONOLACTONE 25 MG PO TABS
100.0000 mg | ORAL_TABLET | Freq: Every day | ORAL | Status: DC
  Administered 2016-05-26: 100 mg via ORAL
  Filled 2016-05-25: qty 4

## 2016-05-25 NOTE — Progress Notes (Signed)
PROGRESS NOTE    Thomas Pratt  [id number] DOB: February 27, 1961 DOA: 05/21/2016 PCP: No PCP Per Patient   Brief Narrative:  75 yom who hardly ever sees a physician with a history of hypertension and a recent clinic visit who was being treated with aldactone and lasix, presents with complaints of worsening abdominal distention. He is a recovering alcoholic of one month. While in the ED, he was noted to be severely hypokalemic, anemic, and hyponatremic with leukocytosis. His albumin was also noted to be low at 2.2 and ammonia 49. He was started on IV potassium supplementation and IV fluids and admitted for further evaluation of alcoholic cirrhosis of the liver. After admission, he started to develop significant volume overload likely related to hypoalbuminemia and liver disease. GI evaluated the patient and recommended US paracentesis which resulted in the removal of 4L of fluid. Patient remains volume overloaded so will add albumin infusion to assist with diuresis.  Assessment & Plan:   Active Problems:   Liver cirrhosis (HCC)   Hypoalbuminemia   Hyponatremia   Hypokalemia   HTN (hypertension)   Ascites   ALC (alcoholic liver cirrhosis) (HCC)   Lower extremity edema   Malnutrition of moderate degree  1. Hepatic cirrhosis. This is likely due to alcohol. Patient is a recovering alcoholic of one month. Abdominal CT with contrast shows a large amount of ascites and nodular hepatic contour. Will continue lasix and Aldactone due to volume overload and low sodium. GI was consulted and recommended a US paracentesis. Patient had paracentesis done 11/1 with removal of 4L of fluid. Fluid analysis doesn't indicate SBP. 2. Anasarca. Related to hypoalbuminemia and liver disease. Still has significant volume overload. Continue with intravenous diuresis. Will add albumin infusion to help with diuresis. Feels abdomen is increasingly distended 3. Hypokalemia. Replaced.  4. Hypervolemic Hyponatremia.  Remains low. Continue lasix, slowly improving. Add fluid restriction 5. Hypoalbuminemia. Likely related to liver disease.  On albumin infusion 6. Macrocytic anemia. Hemoglobin was 11.6 on admission, now 9.4, trending down. There are no active signs of bleeding at this time. Likely related to alcohol consumption. Continue to follow.  7. Leukocytosis. White blood cell count was 11,100 on admission, now within normal limits.   DVT prophylaxis: Heparin injection  Code Status: FULL  Family Communication: discussed with patient Disposition Plan: Discharge home once improved.    Consultants:   GI   Radiology   Nutrition   Procedures:  Paracentesis 11/1 with removal of 4L  Antimicrobials:   None    Subjective: Feels that abdomen may be more distended. No chest pain  Objective: Vitals:   05/24/16 1507 05/24/16 2107 05/25/16 0500 05/25/16 0555  BP: 126/87 110/74  118/73  Pulse: (!) 110 (!) 101  94  Resp: 20 20  20   Temp: 99.1 F (37.3 C) 98.9 F (37.2 C)  98 F (36.7 C)  TempSrc: Oral Oral  Oral  SpO2: 94% 95%  94%  Weight:   78.4 kg (172 lb 13.5 oz)   Height:        Intake/Output Summary (Last 24 hours) at 05/25/16 0717 Last data filed at 05/25/16 0555  Gross per 24 hour  Intake                3 ml  Output             1420 ml  Net            -1417 ml   Filed Weights   05/23/16 1843 05/24/16  0540 05/25/16 0500  Weight: 81.6 kg (179 lb 14.4 oz) 80.6 kg (177 lb 9.6 oz) 78.4 kg (172 lb 13.5 oz)    Examination:   General exam: Appears calm and comfortable  Respiratory system: Clear to auscultation. Respiratory effort normal. Cardiovascular system: S1 & S2 heard, RRR. No JVD, murmurs, rubs, gallops or clicks. 2+ pedal edema. Gastrointestinal system: Abdomen is distended, soft and nontender. No organomegaly or masses felt. Normal bowel sounds heard. Central nervous system: Alert and oriented. No focal neurological deficits. Extremities: Symmetric 5 x 5 power. Skin:  No rashes, lesions or ulcers Psychiatry: Judgement and insight appear normal. Mood & affect appropriate.    Data Reviewed: I have personally reviewed following labs and imaging studies  CBC:  Recent Labs Lab 05/21/16 0924 05/22/16 0613 05/23/16 0636 05/24/16 0638  WBC 11.1* 9.2 8.1 8.7  NEUTROABS 9.2*  --   --   --   HGB 11.6* 10.7* 9.8* 9.4*  HCT 34.6* 32.2* 29.1* 27.9*  MCV 103.6* 102.9* 101.7* 101.5*  PLT 266 236 184 [id number]   Basic Metabolic Panel:  Recent Labs Lab 05/21/16 0924 05/22/16 0613 05/23/16 0636 05/24/16 0638  NA 126* 127* 128* 127*  K 2.6* 3.3* 3.1* 3.5  CL 80* 83* 85* 88*  CO2 35* 37* 37* 36*  GLUCOSE 184* 113* 111* 105*  BUN 17 16 14 11   CREATININE 0.70 0.73 0.69 0.59*  CALCIUM 8.5* 8.3* 8.2* 8.0*  MG 1.8  --   --   --    GFR: Estimated Creatinine Clearance: 99.8 mL/min (by C-G formula based on SCr of 0.59 mg/dL (L)). Liver Function Tests:  Recent Labs Lab 05/21/16 0924 05/24/16 0638  AST 56* 44*  ALT 20 16*  ALKPHOS 78 62  BILITOT 3.4* 2.4*  PROT 8.2* 6.7  ALBUMIN 2.2* 2.0*    Recent Labs Lab 05/21/16 0924  LIPASE 39    Recent Labs Lab 05/21/16 1556 05/24/16 0638  AMMONIA 49* 41*   Coagulation Profile:  Recent Labs Lab 05/21/16 0924 05/22/16 0613  INR 1.40 1.52   Cardiac Enzymes: No results for input(s): CKTOTAL, CKMB, CKMBINDEX, TROPONINI in the last 168 hours. BNP (last 3 results) No results for input(s): PROBNP in the last 8760 hours. HbA1C: No results for input(s): HGBA1C in the last 72 hours. CBG: No results for input(s): GLUCAP in the last 168 hours. Lipid Profile: No results for input(s): CHOL, HDL, LDLCALC, TRIG, CHOLHDL, LDLDIRECT in the last 72 hours. Thyroid Function Tests: No results for input(s): TSH, T4TOTAL, FREET4, T3FREE, THYROIDAB in the last 72 hours. Anemia Panel: No results for input(s): VITAMINB12, FOLATE, FERRITIN, TIBC, IRON, RETICCTPCT in the last 72 hours. Sepsis Labs: No results for  input(s): PROCALCITON, LATICACIDVEN in the last 168 hours.  Recent Results (from the past 240 hour(s))  Gram stain     Status: None   Collection Time: 05/22/16 11:30 AM  Result Value Ref Range Status   Specimen Description FLUID ASCITIC COLLECTED BY DOCTOR  Final   Special Requests NONE  Final   Gram Stain   Final    CYTOSPIN SMEAR WBC PRESENT, PREDOMINANTLY MONONUCLEAR NO ORGANISMS SEEN Performed at [facility]    Report Status 05/22/2016 FINAL  Final  Culture, body fluid-bottle     Status: None (Preliminary result)   Collection Time: 05/22/16 11:30 AM  Result Value Ref Range Status   Specimen Description FLUID ASCITIC COLLECTED BY DOCTOR  Final   Special Requests BOTTLES DRAWN AEROBIC AND ANAEROBIC 10CC  Final  Culture NO GROWTH 2 DAYS  Final   Report Status PENDING  Incomplete         Radiology Studies: No results found.      Scheduled Meds: . feeding supplement (ENSURE ENLIVE)  237 mL Oral BID BM  . heparin  5,000 Units Subcutaneous Q8H  . sodium chloride flush  3 mL Intravenous Q12H  . spironolactone  50 mg Oral Daily   Continuous Infusions:    LOS: 4 days    Time spent: 25 minutes     Kathie Dike, MD [facility] If 7PM-7AM, please contact night-coverage [email or web address] Password [email or web address] 05/25/2016, 7:17 AM

## 2016-05-25 NOTE — Progress Notes (Signed)
  Subjective:  Patient states he ate most of his breakfast. At lunch she ate an outside meal maybe half. He says abdomen is getting more distended. He feels lower extremity edema has not changed. He denies melena or rectal bleeding.  Objective: Blood pressure 118/73, pulse 94, temperature 98 F (36.7 C), temperature source Oral, resp. rate 20, height 5\' 8"  (1.727 m), weight 172 lb 13.5 oz (78.4 kg), SpO2 94 %. Patient is alert and in no acute distress. Asterixis is present. Conjunctiva is pale. Sclera is nonicteric Oropharyngeal mucosa is normal. No neck masses or thyromegaly noted. Abdomen is distended and tense. No tenderness noted on palpation. He has 3+ pitting edema involving both legs.  Labs/studies Results:   Recent Labs  05/23/16 0636 05/24/16 0638  WBC 8.1 8.7  HGB 9.8* 9.4*  HCT 29.1* 27.9*  PLT 184 191    BMET   Recent Labs  05/23/16 0636 05/24/16 0638 05/25/16 0634  NA 128* 127* 127*  K 3.1* 3.5 3.5  CL 85* 88* 88*  CO2 37* 36* 34*  GLUCOSE 111* 105* 106*  BUN 14 11 10   CREATININE 0.69 0.59* 0.66  CALCIUM 8.2* 8.0* 8.3*    LFT   Recent Labs  05/24/16 0638 05/25/16 0634  PROT 6.7 6.6  ALBUMIN 2.0* 2.1*  AST 44* 42*  ALT 16* 16*  ALKPHOS 62 67  BILITOT 2.4* 2.2*  BILIDIR 0.9* 0.9*  IBILI 1.5* 1.3*    PT/INR   Recent Labs  05/25/16 0634  LABPROT 17.2*  INR 1.39      Assessment:  #1. Decompensated liver disease secondary to chronic ethanol abuse complicated by ascites. He underwent abdominal tap 3 days ago. Ascites appears to be reaccumulating. If recorded weights are correct he is gaining weight. Mildly elevated AST may indicate alcoholic hepatitis in addition and therefore hepatic function might improve as alcoholic hepatitis results. Needs to be back on diuretic and he would also benefit from IV albumin for few more doses. He will also need to be screened for esophageal varices at some point. #2. Hyponatremia appears to be  dilutional. As such it is bad prognostic indicator. #3. Anemia appears to be due to chronic disease. #4. Hepatic encephalopathy.   Recommendations:  Lactulose 15 ML by mouth twice a day. Increase spironolactone dose 200 mg by mouth daily. Furosemide 40 mg IV daily given after IV albumin. Albumin 50 g IV daily 3 doses. Metabolic-7 in a.m.

## 2016-05-26 LAB — BASIC METABOLIC PANEL
Anion gap: 6 (ref 5–15)
BUN: 10 mg/dL (ref 6–20)
CALCIUM: 8.6 mg/dL — AB (ref 8.9–10.3)
CO2: 33 mmol/L — AB (ref 22–32)
CREATININE: 0.63 mg/dL (ref 0.61–1.24)
Chloride: 90 mmol/L — ABNORMAL LOW (ref 101–111)
GFR calc Af Amer: >60 mL/min (ref >60)
GFR calc non Af Amer: >60 mL/min (ref >60)
GLUCOSE: 110 mg/dL — AB (ref 65–99)
Potassium: 3.8 mmol/L (ref 3.5–5.1)
Sodium: 129 mmol/L — ABNORMAL LOW (ref 135–145)

## 2016-05-26 LAB — SODIUM, URINE, RANDOM: Sodium, Ur: 29 mmol/L

## 2016-05-26 MED ORDER — SPIRONOLACTONE 25 MG PO TABS
100.0000 mg | ORAL_TABLET | Freq: Two times a day (BID) | ORAL | Status: DC
  Administered 2016-05-26 – 2016-05-28 (×4): 100 mg via ORAL
  Filled 2016-05-26 (×4): qty 4

## 2016-05-26 MED ORDER — METOLAZONE 5 MG PO TABS
5.0000 mg | ORAL_TABLET | Freq: Once | ORAL | Status: AC
  Administered 2016-05-26: 5 mg via ORAL
  Filled 2016-05-26: qty 1

## 2016-05-26 MED ORDER — FUROSEMIDE 10 MG/ML IJ SOLN
60.0000 mg | Freq: Two times a day (BID) | INTRAMUSCULAR | Status: DC
  Administered 2016-05-26 – 2016-05-27 (×2): 60 mg via INTRAVENOUS
  Filled 2016-05-26 (×2): qty 6

## 2016-05-26 MED ORDER — FUROSEMIDE 10 MG/ML IJ SOLN: 40.0000 mg | Freq: Two times a day (BID) | INTRAMUSCULAR | Status: DC

## 2016-05-26 NOTE — Progress Notes (Signed)
PROGRESS NOTE    Thomas Pratt  [id number] DOB: [birth year] DOA: 05/21/2016 PCP: No PCP Per Patient    Brief Narrative:  49 yom who hardly ever sees a physician with a history of hypertension and a recent clinic visit who was being treated with aldactone and lasix, presents with complaints of worsening abdominal distention. He is a recovering alcoholic of one month. While in the ED, he was noted to be severely hypokalemic, anemic, and hyponatremic with leukocytosis. His albumin was also noted to be low at 2.2 and ammonia 49. He was started on IV potassium supplementation and IV fluids and admitted for further evaluation of alcoholic cirrhosis of the liver. After admission, he started to develop significant volume overload likely related to hypoalbuminemia and liver disease. GI evaluated the patient and recommended US paracentesis which resulted in the removal of 4L of fluid. Patient remains volume overloaded so will add albumin infusion to assist with diuresis.  Assessment & Plan:   Active Problems:   Liver cirrhosis (HCC)   Hypoalbuminemia   Hyponatremia   Hypokalemia   HTN (hypertension)   Ascites   ALC (alcoholic liver cirrhosis) (HCC)   Lower extremity edema   Malnutrition of moderate degree  1. Hepatic cirrhosis. This is likely due to alcohol. Patient is a recovering alcoholic of one month. Abdominal CT with contrast shows a large amount of ascites and nodular hepatic contour. Will continue lasix and Aldactone due to volume overload and low sodium. GI was consulted and recommended a US paracentesis. Patient had paracentesis done 11/1 with removal of 4L of fluid. Fluid analysis doesn't indicate SBP. Abdominal distention appears to be recurring. May need to have repeat paracentesis tomorrow. 2. Anasarca. Related to hypoalbuminemia and liver disease. Still has significant volume overload. Continue with intravenous diuresis. Also on albumin infusion to help with diuresis. Feels  abdomen is increasingly distended.  3. Hypokalemia. Replaced.  4. Hypervolemic Hyponatremia. Remains low. Continue lasix and fluid restriction, slowly improving.  5. Hypoalbuminemia. Likely related to liver disease. On albumin infusion 6. Macrocytic anemia. Hemoglobin was 11.6 on admission, now 9.4, trending down. There are no active signs of bleeding at this time. Likely related to alcohol consumption. Continue to follow.  7. Leukocytosis. White blood cell count was 11,100 on admission, now within normal limits.   DVT prophylaxis: Heparin injection  Code Status: FULL  Family Communication: No family bedside Disposition Plan: Discharge home once improved.    Consultants:   GI   Nutrition   Nephrology  Radiology   Procedures:   Paracentesis 11/1 with removal of 4L  Antimicrobials:   None    Subjective: Feels abdomen is more distended. No shortness of breath  Objective: Vitals:   05/25/16 1519 05/25/16 2149 05/26/16 0641 05/26/16 0642  BP: 133/70 132/71 (!) 96/45 (!) 112/58  Pulse: (!) 111 94 88   Resp: 18 18 20    Temp: 98.8 F (37.1 C) 97.9 F (36.6 C) 98.7 F (37.1 C)   TempSrc: Oral Oral Oral   SpO2: 94% 97% 98%   Weight:      Height:        Intake/Output Summary (Last 24 hours) at 05/26/16 0718 Last data filed at 05/26/16 [id number]  Gross per 24 hour  Intake              920 ml  Output              750 ml  Net  170 ml   Filed Weights   05/23/16 1843 05/24/16 0540 05/25/16 0500  Weight: 81.6 kg (179 lb 14.4 oz) 80.6 kg (177 lb 9.6 oz) 78.4 kg (172 lb 13.5 oz)    Examination:  General exam: Appears calm and comfortable  Respiratory system: Clear to auscultation. Respiratory effort normal. Cardiovascular system: S1 & S2 heard, RRR. No JVD, murmurs, rubs, gallops or clicks. 2-3+pedal edema. Gastrointestinal system: Abdomen is distended, soft and nontender. No organomegaly or masses felt. Normal bowel sounds heard. Central nervous system:  Alert and oriented. No focal neurological deficits. Extremities: Symmetric 5 x 5 power. Skin: No rashes, lesions or ulcers Psychiatry: Judgement and insight appear normal. Mood & affect appropriate.     Data Reviewed: I have personally reviewed following labs and imaging studies  CBC:  Recent Labs Lab 05/21/16 0924 05/22/16 0613 05/23/16 0636 05/24/16 0638  WBC 11.1* 9.2 8.1 8.7  NEUTROABS 9.2*  --   --   --   HGB 11.6* 10.7* 9.8* 9.4*  HCT 34.6* 32.2* 29.1* 27.9*  MCV 103.6* 102.9* 101.7* 101.5*  PLT 266 236 184 [id number]   Basic Metabolic Panel:  Recent Labs Lab 05/21/16 0924 05/22/16 0613 05/23/16 0636 05/24/16 0638 05/25/16 0634  NA 126* 127* 128* 127* 127*  K 2.6* 3.3* 3.1* 3.5 3.5  CL 80* 83* 85* 88* 88*  CO2 35* 37* 37* 36* 34*  GLUCOSE 184* 113* 111* 105* 106*  BUN 17 16 14 11 10   CREATININE 0.70 0.73 0.69 0.59* 0.66  CALCIUM 8.5* 8.3* 8.2* 8.0* 8.3*  MG 1.8  --   --   --   --    GFR: Estimated Creatinine Clearance: 99.8 mL/min (by C-G formula based on SCr of 0.66 mg/dL). Liver Function Tests:  Recent Labs Lab 05/21/16 0924 05/24/16 0638 05/25/16 0634  AST 56* 44* 42*  ALT 20 16* 16*  ALKPHOS 78 62 67  BILITOT 3.4* 2.4* 2.2*  PROT 8.2* 6.7 6.6  ALBUMIN 2.2* 2.0* 2.1*    Recent Labs Lab 05/21/16 0924  LIPASE 39    Recent Labs Lab 05/21/16 1556 05/24/16 0638  AMMONIA 49* 41*   Coagulation Profile:  Recent Labs Lab 05/21/16 0924 05/22/16 0613 05/25/16 0634  INR 1.40 1.52 1.39   Cardiac Enzymes: No results for input(s): CKTOTAL, CKMB, CKMBINDEX, TROPONINI in the last 168 hours. BNP (last 3 results) No results for input(s): PROBNP in the last 8760 hours. HbA1C: No results for input(s): HGBA1C in the last 72 hours. CBG: No results for input(s): GLUCAP in the last 168 hours. Lipid Profile: No results for input(s): CHOL, HDL, LDLCALC, TRIG, CHOLHDL, LDLDIRECT in the last 72 hours. Thyroid Function Tests: No results for input(s):  TSH, T4TOTAL, FREET4, T3FREE, THYROIDAB in the last 72 hours. Anemia Panel: No results for input(s): VITAMINB12, FOLATE, FERRITIN, TIBC, IRON, RETICCTPCT in the last 72 hours. Sepsis Labs: No results for input(s): PROCALCITON, LATICACIDVEN in the last 168 hours.  Recent Results (from the past 240 hour(s))  Gram stain     Status: None   Collection Time: 05/22/16 11:30 AM  Result Value Ref Range Status   Specimen Description FLUID ASCITIC COLLECTED BY DOCTOR  Final   Special Requests NONE  Final   Gram Stain   Final    CYTOSPIN SMEAR WBC PRESENT, PREDOMINANTLY MONONUCLEAR NO ORGANISMS SEEN Performed at [facility]    Report Status 05/22/2016 FINAL  Final  Culture, body fluid-bottle     Status: None (Preliminary result)   Collection Time:  05/22/16 11:30 AM  Result Value Ref Range Status   Specimen Description FLUID ASCITIC COLLECTED BY DOCTOR  Final   Special Requests BOTTLES DRAWN AEROBIC AND ANAEROBIC 10CC  Final   Culture NO GROWTH 3 DAYS  Final   Report Status PENDING  Incomplete         Radiology Studies: No results found.      Scheduled Meds: . albumin human  50 g Intravenous Q1400  . feeding supplement (ENSURE ENLIVE)  237 mL Oral BID BM  . furosemide  40 mg Intravenous Daily  . heparin  5,000 Units Subcutaneous Q8H  . lactulose  10 g Oral BID  . sodium chloride flush  3 mL Intravenous Q12H  . spironolactone  100 mg Oral Daily   Continuous Infusions:   LOS: 5 days    Time spent: 25 minutes     Kathie Dike, MD [facility] If 7PM-7AM, please contact night-coverage [email or web address] Password [email or web address] 05/26/2016, 7:18 AM

## 2016-05-26 NOTE — Progress Notes (Signed)
  Subjective:  Patient has no complaints.     Objective: Blood pressure (!) 112/58, pulse 88, temperature 98.7 F (37.1 C), temperature source Oral, resp. rate 20, height 5\' 8"  (1.727 m), weight 173 lb 12.8 oz (78.8 kg), SpO2 98 %. Patient is alert and in NAD. He remains with astexris. Abdomen is distended and tense but nontender. Lungs are clear to auscultation. Abdomen;  He has 3+ pitting edema involving both legs.  Urine output 750 mL/ 24 hr.  Labs/studies Results:   Recent Labs  05/24/16 0638  WBC 8.7  HGB 9.4*  HCT 27.9*  PLT 191    BMET   Recent Labs  05/24/16 0638 05/25/16 0634 05/26/16 0715  NA 127* 127* 129*  K 3.5 3.5 3.8  CL 88* 88* 90*  CO2 36* 34* 33*  GLUCOSE 105* 106* 110*  BUN 11 10 10   CREATININE 0.59* 0.66 0.63  CALCIUM 8.0* 8.3* 8.6*    LFT   Recent Labs  05/24/16 0638 05/25/16 0634  PROT 6.7 6.6  ALBUMIN 2.0* 2.1*  AST 44* 42*  ALT 16* 16*  ALKPHOS 62 67  BILITOT 2.4* 2.2*  BILIDIR 0.9* 0.9*  IBILI 1.5* 1.3*    PT/INR   Recent Labs  05/25/16 0634  LABPROT 17.2*  INR 1.39      Assessment:  #1. Cirrhotic ascites. He is not responding to diuretic therapy and IV albumin. Unless he has brisk diuresis today he will need abdominal paracenteses in a.m. Renal function is well-preserved. Therefore diuretic therapy could be escalated. #2. Decompensated alcoholic liver disease. He also has an element of alcoholic hepatitis and therefore hepatic function may improve some but it remains to be seen. #3. Hyponatremia appears to be dilutional. Sodium has come up slightly since yesterday. #4. Anemia possibly of chronic disease. No evidence of active bleeding. #5. Hepatic encephalopathy. Patient is on low-dose lactulose and having multiple bowel movements.   Recommendations:  Increase spironolactone 200 mg by mouth twice a day. Urinary sodium. Metolazone 5 mg by mouth once today. LVAP in am. Metabolic-7 and serum ammonia in  a.m.

## 2016-05-27 ENCOUNTER — Inpatient Hospital Stay (HOSPITAL_COMMUNITY): Payer: 59

## 2016-05-27 ENCOUNTER — Encounter (HOSPITAL_COMMUNITY): Payer: Self-pay

## 2016-05-27 LAB — CULTURE, BODY FLUID W GRAM STAIN -BOTTLE: Culture: NO GROWTH

## 2016-05-27 LAB — COMPREHENSIVE METABOLIC PANEL
ALBUMIN: 2.9 g/dL — AB (ref 3.5–5.0)
ALT: 15 U/L — ABNORMAL LOW (ref 17–63)
ANION GAP: 5 (ref 5–15)
AST: 40 U/L (ref 15–41)
Alkaline Phosphatase: 62 U/L (ref 38–126)
BILIRUBIN TOTAL: 2.2 mg/dL — AB (ref 0.3–1.2)
BUN: 9 mg/dL (ref 6–20)
CHLORIDE: 91 mmol/L — AB (ref 101–111)
CO2: 32 mmol/L (ref 22–32)
Calcium: 9.1 mg/dL (ref 8.9–10.3)
Creatinine, Ser: 0.56 mg/dL — ABNORMAL LOW (ref 0.61–1.24)
GFR calc Af Amer: >60 mL/min (ref >60)
GLUCOSE: 111 mg/dL — AB (ref 65–99)
POTASSIUM: 3.5 mmol/L (ref 3.5–5.1)
Sodium: 128 mmol/L — ABNORMAL LOW (ref 135–145)
TOTAL PROTEIN: 7 g/dL (ref 6.5–8.1)

## 2016-05-27 LAB — ACID FAST SMEAR (AFB)

## 2016-05-27 LAB — ACID FAST SMEAR (AFB, MYCOBACTERIA): Acid Fast Smear: NEGATIVE

## 2016-05-27 LAB — AMMONIA: AMMONIA: 24 umol/L (ref 9–35)

## 2016-05-27 LAB — CULTURE, BODY FLUID-BOTTLE

## 2016-05-27 MED ORDER — FUROSEMIDE 10 MG/ML IJ SOLN
80.0000 mg | Freq: Two times a day (BID) | INTRAMUSCULAR | Status: DC
  Administered 2016-05-27 – 2016-06-01 (×10): 80 mg via INTRAVENOUS
  Filled 2016-05-27 (×10): qty 8

## 2016-05-27 MED ORDER — ALBUMIN HUMAN 25 % IV SOLN
50.0000 g | Freq: Every day | INTRAVENOUS | Status: AC
  Administered 2016-05-27: 50 g via INTRAVENOUS
  Filled 2016-05-27: qty 200

## 2016-05-27 NOTE — Progress Notes (Signed)
    Subjective: Denies abdominal pain but abdomen feels distended. No overt GI bleeding. 2 BMs thus far today. Denies confusion.   Objective: Vital signs in last 24 hours: Temp:  [98.5 F (36.9 C)-98.9 F (37.2 C)] 98.5 F (36.9 C) (11/06 0541) Pulse Rate:  [93-117] 93 (11/06 0541) Resp:  [20] 20 (11/06 0541) BP: (109-139)/(66-74) 121/68 (11/06 0541) SpO2:  [93 %-94 %] 93 % (11/06 0541) Weight:  [171 lb 4.8 oz (77.7 kg)-173 lb 12.8 oz (78.8 kg)] 171 lb 4.8 oz (77.7 kg) (11/06 0541) Last BM Date: 05/26/16 General:   Alert and oriented,appears older than stated age, muscle wasting noted, sallow-appearing.  Abdomen:  Bowel sounds present, distended with tense ascites, decreased anasarca to flanks noted from admission Extremities:  2-3+ pitting edema extending to mid thigh but overall improved from admission when first seen  Neurologic:  Alert and  oriented x4 but with slight asterixis on exam  Psych:  Alert and cooperative.   Intake/Output from previous day: 11/05 0701 - 11/06 0700 In: 60 [P.O.:60] Out: 1200 [Urine:1200] Intake/Output this shift: No intake/output data recorded.  Lab Results: No results for input(s): WBC, HGB, HCT, PLT in the last 72 hours. BMET  Recent Labs  05/25/16 0634 05/26/16 0715 05/27/16 0700  NA 127* 129* 128*  K 3.5 3.8 3.5  CL 88* 90* 91*  CO2 34* 33* 32  GLUCOSE 106* 110* 111*  BUN 10 10 9   CREATININE 0.66 0.63 0.56*  CALCIUM 8.3* 8.6* 9.1   LFT  Recent Labs  05/25/16 0634 05/27/16 0700  PROT 6.6 7.0  ALBUMIN 2.1* 2.9*  AST 42* 40  ALT 16* 15*  ALKPHOS 67 62  BILITOT 2.2* 2.2*  BILIDIR 0.9*  --   IBILI 1.3*  --    PT/INR  Recent Labs  05/25/16 0634  LABPROT 17.2*  INR 1.39   Assessment: 55 year old male with history of ETOH abuse and cirrhosis, presenting with decompensated features including ascites, anasarca, electrolyte abnormalities. Discriminant function calculated at 30 on admission; therefore prednisolone not  indicated. Viral markers negative. LVAP 11/1 with 4.18 liters removed, negative for SBP, cytology negative. Hyponatremia stable. Creatinine remains normal. Last dose of Albumin today at 1400. Will need to follow electrolytes closely as diuretic therapy may need to be adjusted in light of hyponatremia. Will order paracentesis today.    Plan: Lasix 60 mg IV BID, Aldactone 100 mg BID on board: follow labs closely  Last dose of scheduled Albumin today Lactulose BID Paracentesis today with scheduled albumin to be given at time of paracentesis. Discussed with nursing on floor Ander Purpura).  Outpatient follow-up for cirrhosis care.    Annitta Needs, ANP-BC [facility]      LOS: 6 days    05/27/2016, 8:21 AM

## 2016-05-27 NOTE — Progress Notes (Signed)
PROGRESS NOTE    Thomas Pratt  [id number] DOB: 11-18-1960 DOA: 05/21/2016 PCP: No PCP Per Patient    Brief Narrative:  73 yom who hardly ever sees a physician with a history of hypertension and a recent clinic visit who was being treated with aldactone and lasix, presents with complaints of worsening abdominal distention. He is a recovering alcoholic of one month. While in the ED, he was noted to be severely hypokalemic, anemic, and hyponatremic with leukocytosis. His albumin was also noted to be low at 2.2 and ammonia 49. He was started on IV potassium supplementation and IV fluids and admitted for further evaluation of alcoholic cirrhosis of the liver. After admission, he started to develop significant volume overload likely related to hypoalbuminemia and liver disease. GI evaluated the patient and recommended US paracentesis which resulted in the removal of 4L of fluid. Patient remains volume overloaded so will add albumin infusion to assist with diuresis.  Assessment & Plan:   Active Problems:   Liver cirrhosis (HCC)   Hypoalbuminemia   Hyponatremia   Hypokalemia   HTN (hypertension)   Ascites   ALC (alcoholic liver cirrhosis) (HCC)   Lower extremity edema   Malnutrition of moderate degree  1. Hepatic cirrhosis. This is likely due to alcohol. Patient is a recovering alcoholic of one month. Abdominal CT with contrast shows a large amount of ascites and nodular hepatic contour. Will continue lasix and Aldactone due to volume overload and low sodium. GI was consulted and recommended a US paracentesis. Patient had paracentesis done 11/1 with removal of 4L of fluid. Fluid analysis doesn't indicate SBP. Abdominal distention appears to be recurring. Plans are for LVAP today  2. Anasarca. Related to hypoalbuminemia and liver disease. Still has significant volume overload. Continue with intravenous diuresis, but increase lasix dosing. Also on albumin infusion to help with diuresis.  Feels abdomen is increasingly distended.  3. Hypokalemia. Replaced.  4. Hypervolemic Hyponatremia. Remains low.Continue lasix and fluid restriction, slowly improving.  5. Hypoalbuminemia. Likely related to liver disease. On albumin infusion 6. Macrocytic anemia. Hemoglobin was 11.6 on admission, now 9.4, trending down. There are no active signs of bleeding at this time. Likely related to alcohol consumption. Continue to follow.  7. Leukocytosis. White blood cell count was 11,100 on admission, now within normal limits.   DVT prophylaxis: Heparin injection  Code Status: FULL  Family Communication: no family bedside Disposition Plan: Discharge home once improved.    Consultants:   GI   Nutrition   Nephrology  Radiology   Procedures:   Paracentesis 11/1 with removal of 4L  Antimicrobials:      Subjective: Feels abdomen is more distended. Feels extremities are still very edematous  Objective: Vitals:   05/26/16 0911 05/26/16 1510 05/26/16 2153 05/27/16 0541  BP:  139/74 109/66 121/68  Pulse:  (!) 117 (!) 101 93  Resp:  20 20 20   Temp:  98.7 F (37.1 C) 98.9 F (37.2 C) 98.5 F (36.9 C)  TempSrc:  Oral Oral Oral  SpO2:  94% 94% 93%  Weight: 78.8 kg (173 lb 12.8 oz)   77.7 kg (171 lb 4.8 oz)  Height:        Intake/Output Summary (Last 24 hours) at 05/27/16 0714 Last data filed at 05/27/16 0300  Gross per 24 hour  Intake               60 ml  Output             1200 ml  Net            -1140 ml   Filed Weights   05/25/16 0500 05/26/16 0911 05/27/16 0541  Weight: 78.4 kg (172 lb 13.5 oz) 78.8 kg (173 lb 12.8 oz) 77.7 kg (171 lb 4.8 oz)    Examination:  General exam: Appears calm and comfortable  Respiratory system: Clear to auscultation. Respiratory effort normal. Cardiovascular system: S1 & S2 heard, RRR. No JVD, murmurs, rubs, gallops or clicks. 2-3+ pedal edema. Gastrointestinal system: Abdomen is distended, soft and nontender. No organomegaly or masses  felt. Normal bowel sounds heard. Central nervous system: Alert and oriented. No focal neurological deficits. Extremities: Symmetric 5 x 5 power. Skin: No rashes, lesions or ulcers Psychiatry: Judgement and insight appear normal. Mood & affect appropriate.     Data Reviewed: I have personally reviewed following labs and imaging studies  CBC:  Recent Labs Lab 05/21/16 0924 05/22/16 0613 05/23/16 0636 05/24/16 0638  WBC 11.1* 9.2 8.1 8.7  NEUTROABS 9.2*  --   --   --   HGB 11.6* 10.7* 9.8* 9.4*  HCT 34.6* 32.2* 29.1* 27.9*  MCV 103.6* 102.9* 101.7* 101.5*  PLT 266 236 184 [id number]   Basic Metabolic Panel:  Recent Labs Lab 05/21/16 0924 05/22/16 0613 05/23/16 0636 05/24/16 0638 05/25/16 0634 05/26/16 0715  NA 126* 127* 128* 127* 127* 129*  K 2.6* 3.3* 3.1* 3.5 3.5 3.8  CL 80* 83* 85* 88* 88* 90*  CO2 35* 37* 37* 36* 34* 33*  GLUCOSE 184* 113* 111* 105* 106* 110*  BUN 17 16 14 11 10 10   CREATININE 0.70 0.73 0.69 0.59* 0.66 0.63  CALCIUM 8.5* 8.3* 8.2* 8.0* 8.3* 8.6*  MG 1.8  --   --   --   --   --    GFR: Estimated Creatinine Clearance: 99.8 mL/min (by C-G formula based on SCr of 0.63 mg/dL). Liver Function Tests:  Recent Labs Lab 05/21/16 0924 05/24/16 0638 05/25/16 0634  AST 56* 44* 42*  ALT 20 16* 16*  ALKPHOS 78 62 67  BILITOT 3.4* 2.4* 2.2*  PROT 8.2* 6.7 6.6  ALBUMIN 2.2* 2.0* 2.1*    Recent Labs Lab 05/21/16 0924  LIPASE 39    Recent Labs Lab 05/21/16 1556 05/24/16 0638  AMMONIA 49* 41*   Coagulation Profile:  Recent Labs Lab 05/21/16 0924 05/22/16 0613 05/25/16 0634  INR 1.40 1.52 1.39   Cardiac Enzymes: No results for input(s): CKTOTAL, CKMB, CKMBINDEX, TROPONINI in the last 168 hours. BNP (last 3 results) No results for input(s): PROBNP in the last 8760 hours. HbA1C: No results for input(s): HGBA1C in the last 72 hours. CBG: No results for input(s): GLUCAP in the last 168 hours. Lipid Profile: No results for input(s): CHOL,  HDL, LDLCALC, TRIG, CHOLHDL, LDLDIRECT in the last 72 hours. Thyroid Function Tests: No results for input(s): TSH, T4TOTAL, FREET4, T3FREE, THYROIDAB in the last 72 hours. Anemia Panel: No results for input(s): VITAMINB12, FOLATE, FERRITIN, TIBC, IRON, RETICCTPCT in the last 72 hours. Sepsis Labs: No results for input(s): PROCALCITON, LATICACIDVEN in the last 168 hours.  Recent Results (from the past 240 hour(s))  Gram stain     Status: None   Collection Time: 05/22/16 11:30 AM  Result Value Ref Range Status   Specimen Description FLUID ASCITIC COLLECTED BY DOCTOR  Final   Special Requests NONE  Final   Gram Stain   Final    CYTOSPIN SMEAR WBC PRESENT, PREDOMINANTLY MONONUCLEAR NO ORGANISMS SEEN Performed at [facility]    Report Status 05/22/2016 FINAL  Final  Culture, body fluid-bottle     Status: None (Preliminary result)   Collection Time: 05/22/16 11:30 AM  Result Value Ref Range Status   Specimen Description FLUID ASCITIC COLLECTED BY DOCTOR  Final   Special Requests BOTTLES DRAWN AEROBIC AND ANAEROBIC 10CC  Final   Culture NO GROWTH 4 DAYS  Final   Report Status PENDING  Incomplete         Radiology Studies: No results found.      Scheduled Meds: . albumin human  50 g Intravenous Q1400  . feeding supplement (ENSURE ENLIVE)  237 mL Oral BID BM  . furosemide  60 mg Intravenous BID  . heparin  5,000 Units Subcutaneous Q8H  . lactulose  10 g Oral BID  . sodium chloride flush  3 mL Intravenous Q12H  . spironolactone  100 mg Oral BID   Continuous Infusions:   LOS: 6 days    Time spent: 25 minutes     Kathie Dike, MD [facility] If 7PM-7AM, please contact night-coverage [email or web address] Password [email or web address] 05/27/2016, 7:14 AM

## 2016-05-27 NOTE — Progress Notes (Signed)
Paracentesis complete no signs of distress; 6L yellow colored ascites removed.  

## 2016-05-28 LAB — CBC
HCT: 26.8 % — ABNORMAL LOW (ref 39.0–52.0)
HEMOGLOBIN: 9.4 g/dL — AB (ref 13.0–17.0)
MCH: 34.9 pg — AB (ref 26.0–34.0)
MCHC: 35.1 g/dL (ref 30.0–36.0)
MCV: 99.6 fL (ref 78.0–100.0)
Platelets: 215 10*3/uL (ref 150–400)
RBC: 2.69 MIL/uL — AB (ref 4.22–5.81)
RDW: 15.4 % (ref 11.5–15.5)
WBC: 8.4 10*3/uL (ref 4.0–10.5)

## 2016-05-28 LAB — BASIC METABOLIC PANEL
ANION GAP: 6 (ref 5–15)
BUN: 8 mg/dL (ref 6–20)
CHLORIDE: 89 mmol/L — AB (ref 101–111)
CO2: 33 mmol/L — AB (ref 22–32)
CREATININE: 0.66 mg/dL (ref 0.61–1.24)
Calcium: 9 mg/dL (ref 8.9–10.3)
GFR calc non Af Amer: >60 mL/min (ref >60)
GLUCOSE: 118 mg/dL — AB (ref 65–99)
Potassium: 3.9 mmol/L (ref 3.5–5.1)
Sodium: 128 mmol/L — ABNORMAL LOW (ref 135–145)

## 2016-05-28 MED ORDER — SPIRONOLACTONE 25 MG PO TABS
100.0000 mg | ORAL_TABLET | Freq: Every day | ORAL | Status: DC
  Administered 2016-05-29 – 2016-06-01 (×4): 100 mg via ORAL
  Filled 2016-05-28 (×5): qty 4

## 2016-05-28 MED ORDER — SPIRONOLACTONE 25 MG PO TABS
200.0000 mg | ORAL_TABLET | Freq: Every day | ORAL | Status: DC
  Administered 2016-05-28 – 2016-05-30 (×3): 200 mg via ORAL
  Filled 2016-05-28 (×3): qty 8

## 2016-05-28 NOTE — Progress Notes (Signed)
Nutrition Follow-up  DOCUMENTATION CODES:   INTERVENTION:  Ensure Enlive po BID  2 gr Sodium diet with 1.2 liter fluid restriction  Daily snack qhs   NUTRITION DIAGNOSIS:   Inadequate oral intake related to increased metabolic demand in chronic illness ; ongoing.    GOAL:   Patient will meet greater than or equal to 90% of their needs NOT met based on meal intake records and pt report.   MONITOR:   Supplement acceptance, PO intake, Weight trends, Labs  REASON FOR ASSESSMENT:   Malnutrition Screening Tool    ASSESSMENT:  Patient has hx of ETOH abuse and has been off alcohol for about 1 month. He presents with c/o abdominal distention and has hx of acholic liver cirrhosis. Pt has ascites and is just returning from having paracentisis (4) liters removed. Yesterday another 6 liters ascites removed. His weight is now in the 140's and pt says he's feeling better. His meal intake is ranging 50-75% and he says sometimes his daughter will bring him a homemade tortilla from home.   Recent Labs Lab 05/26/16 0715 05/27/16 0700 05/28/16 0635  NA 129* 128* 128*  K 3.8 3.5 3.9  CL 90* 91* 89*  CO2 33* 32 33*  BUN '10 9 8  ' CREATININE 0.63 0.56* 0.66  CALCIUM 8.6* 9.1 9.0  GLUCOSE 110* 111* 118*   Labs: Sodium 128, glucose 118 mg/dl  Diet Order:  Diet 2 gram sodium Room service appropriate? Yes; Fluid consistency: Thin; Fluid restriction: 1200 mL Fluid  Skin:  Reviewed, no issues  Last BM:  11/7   Height:   Ht Readings from Last 1 Encounters:  05/21/16 '5\' 8"'  (1.727 m)    Weight:   Wt Readings from Last 1 Encounters:  05/28/16 147 lb 12.8 oz (67 kg)    Ideal Body Weight:     BMI:  Body mass index is 22.47 kg/m.  Estimated Nutritional Needs:   Kcal:  2200-2450  Protein:  98-105 gr  Fluid:  1.8 liters daily  EDUCATION NEEDS:   Pt is aware of recommendations for eating small frequent meals but is not compliant with low sodium diet recommendations.   Colman Cater MS,RD,CSG,LDN Office: [phone] Pager: [phone]

## 2016-05-28 NOTE — Care Management Note (Signed)
Case Management Note  Patient Details  Name: Thomas Pratt MRN: [id number] Date of Birth: 1961-07-09  If discussed at Long Length of Stay Meetings, dates discussed:  05/28/2016   Sherald Barge, RN 05/28/2016, 10:35 AM

## 2016-05-28 NOTE — Progress Notes (Signed)
    Subjective: No abdominal pain, N/V. Abdomen less distended. No confusion. Tolerating diet. No overt GI bleeding.   Objective: Vital signs in last 24 hours: Temp:  [98.3 F (36.8 C)-99.4 F (37.4 C)] 98.3 F (36.8 C) (11/07 0417) Pulse Rate:  [87-100] 87 (11/07 0417) Resp:  [17-20] 17 (11/07 0417) BP: (103-135)/(58-80) 103/68 (11/07 0417) SpO2:  [95 %-97 %] 97 % (11/07 0417) Weight:  [147 lb 12.8 oz (67 kg)] 147 lb 12.8 oz (67 kg) (11/07 0417) Last BM Date: 05/28/16 General:   Alert and oriented, appears older than stated age, muscle wasting to extremities, sallow-appearing.  Abdomen:  Bowel sounds present, mildly distended but soft, no rebound or guarding. Anasarca to  flank area much improved Extremities:  Without 2+ pitting edema, pedal edema, improved from yesterday.  Neurologic:  Alert and  oriented x4; negative asterixis  Psych:  Alert and cooperative. Normal mood and affect.  Intake/Output from previous day: 11/06 0701 - 11/07 0700 In: 483 [P.O.:480; I.V.:3] Out: 800 [Urine:800] Intake/Output this shift: No intake/output data recorded.  Lab Results:  Recent Labs  05/28/16 0635  WBC 8.4  HGB 9.4*  HCT 26.8*  PLT 215   BMET  Recent Labs  05/26/16 0715 05/27/16 0700 05/28/16 0635  NA 129* 128* 128*  K 3.8 3.5 3.9  CL 90* 91* 89*  CO2 33* 32 33*  GLUCOSE 110* 111* 118*  BUN 10 9 8   CREATININE 0.63 0.56* 0.66  CALCIUM 8.6* 9.1 9.0   LFT  Recent Labs  05/27/16 0700  PROT 7.0  ALBUMIN 2.9*  AST 40  ALT 15*  ALKPHOS 51  BILITOT 2.2*     Assessment: 55 year old male with history of ETOH abuse and cirrhosis, presenting with decompensated features including ascites, anasarca, electrolyte abnormalities. Discriminant function calculated at 30 on admission; therefore prednisolone not indicated. Viral markers negative. LVAP 11/1 with 4.18 liters removed, negative for SBP, cytology negative. Repeat LVAP 11/6 with 6 liters removed. Hyponatremia stable.  Creatinine remains normal. Overall, anasarca has improved, continues to diurese slowly. Lasix increased to 80 mg BID yesterday by attending. Will need to follow electrolytes closely as diuretic therapy may need to be adjusted in light of hyponatremia but for now remains stable.   Anemia: multifactorial without signs of overt GI bleeding. Hgb stable from several days ago.     Plan: Lasix 80 mg IV BID, Aldactone 100 mg BID, continue to follow labs Lactulose BID 2 gram sodium diet with fluid restriction Will need further outpatient follow-up for continued cirrhosis care  Follow I/Os    Thomas Pratt, ANP-BC [facility]      LOS: 7 days    05/28/2016, 8:22 AM

## 2016-05-28 NOTE — Progress Notes (Signed)
PROGRESS NOTE    Thomas Pratt  [id number] DOB: 07/19/1961 DOA: 05/21/2016 PCP: No PCP Per Patient    Brief Narrative:  18 yom who hardly ever sees a physician with a history of hypertension who was being treated with aldactone and lasix, presents with complaints of worsening abdominal distention. He is a recovering alcoholic of one month. While in the ED, he was noted to be severely hypokalemic, anemic, and hyponatremic with leukocytosis. His albumin was also noted to be low at 2.2 and ammonia 49. He was started on IV potassium supplementation and IV fluids and admitted for further evaluation of alcoholic cirrhosis of the liver. After admission, he started to develop significant volume overload likely related to hypoalbuminemia and liver disease. GI has been following the patient. He has had 2 LVAP thus far. Patient remains volume overloaded and still requires further diuresis.  Assessment & Plan:   Active Problems:   Liver cirrhosis (HCC)   Hypoalbuminemia   Hyponatremia   Hypokalemia   HTN (hypertension)   Ascites   ALC (alcoholic liver cirrhosis) (HCC)   Lower extremity edema   Malnutrition of moderate degree  1. Hepatic cirrhosis. This is likely due to alcohol. Patient is a recovering alcoholic of one month. Abdominal CT with contrast shows a large amount of ascites and nodular hepatic contour. Will continue lasix and Aldactone due to volume overload and low sodium. GI was consulted and recommended a US paracentesis. Patient had paracentesis done 11/1 with removal of 4L of fluid. Fluid analysis didn't indicate SBP. Abdominal began to recur. LVAP was completed 11/6 with the removal of 6L of fluid.  2. Anasarca. Related to hypoalbuminemia and liver disease. Still has significant volume overload. Continue with intravenous diuresis, but increase lasix dosing and aldactone. Also on albumin infusion to help with diuresis. He has had 2 LVAP thus far.  3. Hypokalemia. Replaced.   4. Hypervolemic Hyponatremia. Remains low.Continue lasix and fluid restriction, slowly improving.  5. Hypoalbuminemia. Likely related to liver disease. On albumin infusion 6. Macrocytic anemia. Hemoglobin was 11.6 on admission, now 9.4, trending down. There are no active signs of bleeding at this time. Likely related to alcohol consumption. Continue to follow.  7. Leukocytosis. White blood cell count was 11,100 on admission, now within normal limits.   DVT prophylaxis: Heparin injection  Code Status: FULL  Family Communication: No family bedside Disposition Plan: Discharge home once improved.    Consultants:   GI   Nutrition   Nephrology  Procedures:  Paracentesis 11/1 with removal of 4L  Paracentesis 11/6 with removal of 6L   Antimicrobials:   None    Subjective: Feeling better. Abdomen is less distended. Still complaining of LE edema  Objective: Vitals:   05/27/16 1524 05/27/16 1603 05/27/16 2102 05/28/16 0417  BP: 108/69 106/80 (!) 117/58 103/68  Pulse: 95  98 87  Resp: 20 18 17 17   Temp:  98.7 F (37.1 C) 99.4 F (37.4 C) 98.3 F (36.8 C)  TempSrc:  Oral Oral Oral  SpO2: 96% 96% 95% 97%  Weight:    67 kg (147 lb 12.8 oz)  Height:        Intake/Output Summary (Last 24 hours) at 05/28/16 0809 Last data filed at 05/27/16 2209  Gross per 24 hour  Intake              483 ml  Output              800 ml  Net             -  317 ml   Filed Weights   05/26/16 0911 05/27/16 0541 05/28/16 0417  Weight: 78.8 kg (173 lb 12.8 oz) 77.7 kg (171 lb 4.8 oz) 67 kg (147 lb 12.8 oz)    Examination:  General exam: Appears calm and comfortable  Respiratory system: Clear to auscultation. Respiratory effort normal. Cardiovascular system: S1 & S2 heard, RRR. No JVD, murmurs, rubs, gallops or clicks. 2+ pedal edema. Gastrointestinal system: Abdomen is mildly distended, soft and nontender. No organomegaly or masses felt. Normal bowel sounds heard. Central nervous system:  Alert and oriented. No focal neurological deficits. Extremities: Symmetric 5 x 5 power. Skin: No rashes, lesions or ulcers Psychiatry: Judgement and insight appear normal. Mood & affect appropriate.     Data Reviewed: I have personally reviewed following labs and imaging studies  CBC:  Recent Labs Lab 05/21/16 0924 05/22/16 0613 05/23/16 0636 05/24/16 0638 05/28/16 0635  WBC 11.1* 9.2 8.1 8.7 8.4  NEUTROABS 9.2*  --   --   --   --   HGB 11.6* 10.7* 9.8* 9.4* 9.4*  HCT 34.6* 32.2* 29.1* 27.9* 26.8*  MCV 103.6* 102.9* 101.7* 101.5* 99.6  PLT 266 236 184 191 [id number]   Basic Metabolic Panel:  Recent Labs Lab 05/21/16 0924  05/24/16 0638 05/25/16 0634 05/26/16 0715 05/27/16 0700 05/28/16 0635  NA 126*  < > 127* 127* 129* 128* 128*  K 2.6*  < > 3.5 3.5 3.8 3.5 3.9  CL 80*  < > 88* 88* 90* 91* 89*  CO2 35*  < > 36* 34* 33* 32 33*  GLUCOSE 184*  < > 105* 106* 110* 111* 118*  BUN 17  < > 11 10 10 9 8   CREATININE 0.70  < > 0.59* 0.66 0.63 0.56* 0.66  CALCIUM 8.5*  < > 8.0* 8.3* 8.6* 9.1 9.0  MG 1.8  --   --   --   --   --   --   < > = values in this interval not displayed. GFR: Estimated Creatinine Clearance: 97.7 mL/min (by C-G formula based on SCr of 0.66 mg/dL). Liver Function Tests:  Recent Labs Lab 05/21/16 0924 05/24/16 0638 05/25/16 0634 05/27/16 0700  AST 56* 44* 42* 40  ALT 20 16* 16* 15*  ALKPHOS 78 62 67 62  BILITOT 3.4* 2.4* 2.2* 2.2*  PROT 8.2* 6.7 6.6 7.0  ALBUMIN 2.2* 2.0* 2.1* 2.9*    Recent Labs Lab 05/21/16 0924  LIPASE 39    Recent Labs Lab 05/21/16 1556 05/24/16 0638 05/27/16 0700  AMMONIA 49* 41* 24   Coagulation Profile:  Recent Labs Lab 05/21/16 0924 05/22/16 0613 05/25/16 0634  INR 1.40 1.52 1.39   Cardiac Enzymes: No results for input(s): CKTOTAL, CKMB, CKMBINDEX, TROPONINI in the last 168 hours. BNP (last 3 results) No results for input(s): PROBNP in the last 8760 hours. HbA1C: No results for input(s): HGBA1C in the  last 72 hours. CBG: No results for input(s): GLUCAP in the last 168 hours. Lipid Profile: No results for input(s): CHOL, HDL, LDLCALC, TRIG, CHOLHDL, LDLDIRECT in the last 72 hours. Thyroid Function Tests: No results for input(s): TSH, T4TOTAL, FREET4, T3FREE, THYROIDAB in the last 72 hours. Anemia Panel: No results for input(s): VITAMINB12, FOLATE, FERRITIN, TIBC, IRON, RETICCTPCT in the last 72 hours. Sepsis Labs: No results for input(s): PROCALCITON, LATICACIDVEN in the last 168 hours.  Recent Results (from the past 240 hour(s))  Acid Fast Smear (AFB)     Status: None   Collection Time: 05/22/16 11:30  AM  Result Value Ref Range Status   AFB Specimen Processing Concentration  Final   Acid Fast Smear Negative  Final    Comment: (NOTE) Performed At: [facility] [place], [place] [id number] Lindon Romp MD [id number]    Source (AFB) FLUID  Final    Comment: ASCITIC COLLECTED BY DOCTOR BOLES   Gram stain     Status: None   Collection Time: 05/22/16 11:30 AM  Result Value Ref Range Status   Specimen Description FLUID ASCITIC COLLECTED BY DOCTOR  Final   Special Requests NONE  Final   Gram Stain   Final    CYTOSPIN SMEAR WBC PRESENT, PREDOMINANTLY MONONUCLEAR NO ORGANISMS SEEN Performed at [facility]    Report Status 05/22/2016 FINAL  Final  Culture, body fluid-bottle     Status: None   Collection Time: 05/22/16 11:30 AM  Result Value Ref Range Status   Specimen Description FLUID ASCITIC COLLECTED BY DOCTOR  Final   Special Requests BOTTLES DRAWN AEROBIC AND ANAEROBIC 10CC  Final   Culture NO GROWTH 5 DAYS  Final   Report Status 05/27/2016 FINAL  Final         Radiology Studies: No results found.      Scheduled Meds: . feeding supplement (ENSURE ENLIVE)  237 mL Oral BID BM  . furosemide  80 mg Intravenous BID  . heparin  5,000 Units Subcutaneous Q8H  . lactulose  10 g Oral BID  . sodium chloride flush  3 mL  Intravenous Q12H  . spironolactone  100 mg Oral BID   Continuous Infusions:   LOS: 7 days    Time spent: 25 minutes     Kathie Dike, MD [facility] If 7PM-7AM, please contact night-coverage [email or web address] Password [email or web address] 05/28/2016, 8:09 AM

## 2016-05-29 LAB — BASIC METABOLIC PANEL
Anion gap: 7 (ref 5–15)
BUN: 9 mg/dL (ref 6–20)
CALCIUM: 8.9 mg/dL (ref 8.9–10.3)
CHLORIDE: 88 mmol/L — AB (ref 101–111)
CO2: 32 mmol/L (ref 22–32)
CREATININE: 0.67 mg/dL (ref 0.61–1.24)
GFR calc Af Amer: >60 mL/min (ref >60)
GFR calc non Af Amer: >60 mL/min (ref >60)
GLUCOSE: 115 mg/dL — AB (ref 65–99)
Potassium: 3.6 mmol/L (ref 3.5–5.1)
Sodium: 127 mmol/L — ABNORMAL LOW (ref 135–145)

## 2016-05-29 LAB — SODIUM, URINE, RANDOM: Sodium, Ur: 117 mmol/L

## 2016-05-29 NOTE — Progress Notes (Signed)
PROGRESS NOTE    Thomas Pratt  [id number] DOB: 1961/02/03 DOA: 05/21/2016 PCP: No PCP Per Patient    Brief Narrative:  85 yom who hardly ever sees a physician with a history of hypertension who was being treated with aldactone and lasix, presents with complaints of worsening abdominal distention. He is a recovering alcoholic of one month. While in the ED, he was noted to be severely hypokalemic, anemic, and hyponatremic with leukocytosis. His albumin was also noted to be low at 2.2 and ammonia 49. He was started on IV potassium supplementation and IV fluids and admitted for further evaluation of alcoholic cirrhosis of the liver. After admission, he started to develop significant volume overload likely related to hypoalbuminemia and liver disease. GI has been following the patient. He has had 2 LVAP thus far. Patient remains volume overloaded and still requires further diuresis.  Assessment & Plan:   Active Problems:   Liver cirrhosis (HCC)   Hypoalbuminemia   Hyponatremia   Hypokalemia   HTN (hypertension)   Ascites   ALC (alcoholic liver cirrhosis) (HCC)   Lower extremity edema   Malnutrition of moderate degree  1. Hepatic cirrhosis. This is likely due to alcohol. Patient is a recovering alcoholic of one month. Abdominal CT with contrast shows a large amount of ascites and nodular hepatic contour. Will continue lasix and Aldactone due to volume overload and low sodium. GI was consulted and recommended a US paracentesis. Patient had paracentesis done 11/1 with removal of 4L of fluid. Fluid analysis didn't indicate SBP. Second LVAP was completed 11/6 with the removal of 6L of fluid. He will need outpatient follow up with GI and BMET in 1 week. 2. Anasarca. Related to hypoalbuminemia and liver disease. Continue with intravenous diuresis, with lasix and aldactone. He has had 2 LVAP thus far. Overall volume status is improving. Anticipate transition to oral diuretics in  AM 3. Hypokalemia. Replaced.  4. Hypervolemic Hyponatremia. Remains low.Continue lasix and fluid restriction, slowly improving.  5. Hypoalbuminemia. Likely related to liver disease. On albumin infusion 6. Macrocytic anemia. Hemoglobin was 11.6 on admission, now 9.4, trending down. There are no active signs of bleeding at this time. Likely related to alcohol consumption. Continue to follow.  7. Leukocytosis. White blood cell count was 11,100 on admission, now within normal limits.   DVT prophylaxis: Heparin injection  Code Status: FULL  Family Communication: No family bedside Disposition Plan: Discharge home once improved.    Consultants:   GI   Nutrition   Nephrology  Procedures:  Paracentesis 11/1 with removal of 4L  Paracentesis 11/6 with removal of 6L   Antimicrobials:   None    Subjective: Feeling better. Abdomen is less distended. Feels lower extremity edema is improving  Objective: Vitals:   05/28/16 1453 05/28/16 2145 05/29/16 0500 05/29/16 1443  BP: (!) 87/42 106/62 (!) 94/49 106/68  Pulse: 98 98 94 91  Resp: 20 20 20 20   Temp: 99.1 F (37.3 C) 98.8 F (37.1 C) 98.2 F (36.8 C) 98.4 F (36.9 C)  TempSrc: Oral Oral Oral Oral  SpO2: 95% 92% 94% 96%  Weight:   63.8 kg (140 lb 9.6 oz)   Height:        Intake/Output Summary (Last 24 hours) at 05/29/16 1707 Last data filed at 05/29/16 1300  Gross per 24 hour  Intake              720 ml  Output  1000 ml  Net             -280 ml   Filed Weights   05/27/16 0541 05/28/16 0417 05/29/16 0500  Weight: 77.7 kg (171 lb 4.8 oz) 67 kg (147 lb 12.8 oz) 63.8 kg (140 lb 9.6 oz)    Examination:  General exam: Appears calm and comfortable  Respiratory system: Clear to auscultation. Respiratory effort normal. Cardiovascular system: S1 & S2 heard, RRR. No JVD, murmurs, rubs, gallops or clicks. 1+ pedal edema. Gastrointestinal system: Abdomen is mildly distended, soft and nontender. No organomegaly or  masses felt. Normal bowel sounds heard. Central nervous system: Alert and oriented. No focal neurological deficits. Extremities: Symmetric 5 x 5 power. Skin: No rashes, lesions or ulcers Psychiatry: Judgement and insight appear normal. Mood & affect appropriate.     Data Reviewed: I have personally reviewed following labs and imaging studies  CBC:  Recent Labs Lab 05/23/16 0636 05/24/16 0638 05/28/16 0635  WBC 8.1 8.7 8.4  HGB 9.8* 9.4* 9.4*  HCT 29.1* 27.9* 26.8*  MCV 101.7* 101.5* 99.6  PLT 184 191 [id number]   Basic Metabolic Panel:  Recent Labs Lab 05/25/16 0634 05/26/16 0715 05/27/16 0700 05/28/16 0635 05/29/16 0623  NA 127* 129* 128* 128* 127*  K 3.5 3.8 3.5 3.9 3.6  CL 88* 90* 91* 89* 88*  CO2 34* 33* 32 33* 32  GLUCOSE 106* 110* 111* 118* 115*  BUN 10 10 9 8 9   CREATININE 0.66 0.63 0.56* 0.66 0.67  CALCIUM 8.3* 8.6* 9.1 9.0 8.9   GFR: Estimated Creatinine Clearance: 93 mL/min (by C-G formula based on SCr of 0.67 mg/dL). Liver Function Tests:  Recent Labs Lab 05/24/16 0638 05/25/16 0634 05/27/16 0700  AST 44* 42* 40  ALT 16* 16* 15*  ALKPHOS 62 67 62  BILITOT 2.4* 2.2* 2.2*  PROT 6.7 6.6 7.0  ALBUMIN 2.0* 2.1* 2.9*   No results for input(s): LIPASE, AMYLASE in the last 168 hours.  Recent Labs Lab 05/24/16 0638 05/27/16 0700  AMMONIA 41* 24   Coagulation Profile:  Recent Labs Lab 05/25/16 0634  INR 1.39   Cardiac Enzymes: No results for input(s): CKTOTAL, CKMB, CKMBINDEX, TROPONINI in the last 168 hours. BNP (last 3 results) No results for input(s): PROBNP in the last 8760 hours. HbA1C: No results for input(s): HGBA1C in the last 72 hours. CBG: No results for input(s): GLUCAP in the last 168 hours. Lipid Profile: No results for input(s): CHOL, HDL, LDLCALC, TRIG, CHOLHDL, LDLDIRECT in the last 72 hours. Thyroid Function Tests: No results for input(s): TSH, T4TOTAL, FREET4, T3FREE, THYROIDAB in the last 72 hours. Anemia Panel: No  results for input(s): VITAMINB12, FOLATE, FERRITIN, TIBC, IRON, RETICCTPCT in the last 72 hours. Sepsis Labs: No results for input(s): PROCALCITON, LATICACIDVEN in the last 168 hours.  Recent Results (from the past 240 hour(s))  Acid Fast Smear (AFB)     Status: None   Collection Time: 05/22/16 11:30 AM  Result Value Ref Range Status   AFB Specimen Processing Concentration  Final   Acid Fast Smear Negative  Final    Comment: (NOTE) Performed At: [facility] [place], [place] [id number] Lindon Romp MD [id number]    Source (AFB) FLUID  Final    Comment: ASCITIC COLLECTED BY DOCTOR BOLES   Gram stain     Status: None   Collection Time: 05/22/16 11:30 AM  Result Value Ref Range Status   Specimen Description FLUID ASCITIC COLLECTED BY Ralene Cork  Final   Special Requests NONE  Final   Gram Stain   Final    CYTOSPIN SMEAR WBC PRESENT, PREDOMINANTLY MONONUCLEAR NO ORGANISMS SEEN Performed at [facility]    Report Status 05/22/2016 FINAL  Final  Culture, body fluid-bottle     Status: None   Collection Time: 05/22/16 11:30 AM  Result Value Ref Range Status   Specimen Description FLUID ASCITIC COLLECTED BY DOCTOR  Final   Special Requests BOTTLES DRAWN AEROBIC AND ANAEROBIC 10CC  Final   Culture NO GROWTH 5 DAYS  Final   Report Status 05/27/2016 FINAL  Final         Radiology Studies: No results found.      Scheduled Meds: . feeding supplement (ENSURE ENLIVE)  237 mL Oral BID BM  . furosemide  80 mg Intravenous BID  . heparin  5,000 Units Subcutaneous Q8H  . lactulose  10 g Oral BID  . sodium chloride flush  3 mL Intravenous Q12H  . spironolactone  100 mg Oral Q breakfast  . spironolactone  200 mg Oral Q2200   Continuous Infusions:   LOS: 8 days    Time spent: 25 minutes     Kathie Dike, MD [facility] If 7PM-7AM, please contact night-coverage [email or web address] Password [email or web address] 05/29/2016, 5:07 PM

## 2016-05-29 NOTE — Progress Notes (Addendum)
Subjective:  No complaints  Objective: Vital signs in last 24 hours: Temp:  [98.2 F (36.8 C)-99.1 F (37.3 C)] 98.2 F (36.8 C) (11/08 0500) Pulse Rate:  [94-98] 94 (11/08 0500) Resp:  [20] 20 (11/08 0500) BP: (87-106)/(42-62) 94/49 (11/08 0500) SpO2:  [92 %-95 %] 94 % (11/08 0500) Weight:  [140 lb 9.6 oz (63.8 kg)] 140 lb 9.6 oz (63.8 kg) (11/08 0500) Last BM Date: 05/29/16 General:   Alert,  in NAD Head:  Normocephalic and atraumatic. Eyes:  Sclera clear, no icterus.  Abdomen:  Soft, nontender and slightly distended but soft. Normal bowel sounds, without guarding, and without rebound.   Extremities:  Without clubbing, deformity. 1-2+ pitting edema at ankles only. Neurologic:  Alert and  oriented x4;  grossly normal neurologically. Skin:  Intact without significant lesions or rashes. Psych:  Alert and cooperative. Normal mood and affect.  Intake/Output from previous day: 11/07 0701 - 11/08 0700 In: 240 [P.O.:240] Out: 550 [Urine:550] Intake/Output this shift: No intake/output data recorded.  Lab Results: CBC  Recent Labs  05/28/16 0635  WBC 8.4  HGB 9.4*  HCT 26.8*  MCV 99.6  PLT 215   BMET  Recent Labs  05/27/16 0700 05/28/16 0635 05/29/16 0623  NA 128* 128* 127*  K 3.5 3.9 3.6  CL 91* 89* 88*  CO2 32 33* 32  GLUCOSE 111* 118* 115*  BUN '9 8 9  ' CREATININE 0.56* 0.66 0.67  CALCIUM 9.1 9.0 8.9   LFTs  Recent Labs  05/27/16 0700  BILITOT 2.2*  ALKPHOS 62  AST 40  ALT 15*  PROT 7.0  ALBUMIN 2.9*   No results for input(s): LIPASE in the last 72 hours. PT/INR No results for input(s): LABPROT, INR in the last 72 hours.    Imaging Studies: Dg Chest 2 View  Result Date: 05/21/2016 CLINICAL DATA:  Lower extremity edema with abdominal distention and dyspnea with exertion EXAM: CHEST  2 VIEW COMPARISON:  May 28, 2014 FINDINGS: Degree of inspiration is quite shallow. There is bibasilar atelectasis. Lungs elsewhere are clear. Heart size and  pulmonary vascularity are normal. No adenopathy. No bone lesions. IMPRESSION: Shallow degree of inspiration. Bibasilar atelectasis. No edema or consolidation. Cardiac silhouette within normal limits. Electronically Signed   By: Lowella Grip III M.D.   On: 05/21/2016 10:24   Ct Abdomen Pelvis W Contrast  Result Date: 05/21/2016 CLINICAL DATA:  Severe abdominal distention.  Shortness of breath. EXAM: CT ABDOMEN AND PELVIS WITH CONTRAST TECHNIQUE: Multidetector CT imaging of the abdomen and pelvis was performed using the standard protocol following bolus administration of intravenous contrast. CONTRAST:  150m ISOVUE-300 IOPAMIDOL (ISOVUE-300) INJECTION 61% COMPARISON:  None. FINDINGS: Lower chest: There is right lower lobe atelectasis/consolidation. No pleural effusion. Hepatobiliary: The liver is shrunken with a nodular contour. There is large volume ascites throughout the entire abdomen and pelvis. Small caliber recannulized umbilical vein. Pancreas: Pancreatic contours and enhancement are normal. Spleen: There are multiple areas of splenic hypoattenuation. The spleen is not enlarged. Adrenals/Urinary Tract: The adrenal glands are normal. Both kidneys are normal. Stomach/Bowel: No dilated small bowel or other evidence of obstruction. No enteric or colonic inflammation. The appendix is normal. No pneumobilia or portal venous gas. Vascular/Lymphatic: There is atherosclerotic calcification of the non aneurysmal abdominal aorta. The portal vein, superior mesenteric vein and splenic vein are patent. No abdominal or pelvic lymphadenopathy identified. Reproductive: Prostate and seminal vesicles are unremarkable. Other: None Musculoskeletal: No bony spinal canal stenosis. No lytic or blastic osseous lesions.  IMPRESSION: 1. Large amount of abdominal/pelvic ascites. 2. Nodular hepatic contour, suggesting hepatic cirrhosis. 3. Aortic atherosclerosis. 4. Right basilar atelectasis/consolidation. 5. Multiple areas of  spine hypoattenuation may be small splenic infarcts related to underlying portal hypertension. Electronically Signed   By: Ulyses Jarred M.D.   On: 05/21/2016 19:48   US Paracentesis  Result Date: 05/22/2016 INDICATION: Ascites, cirrhosis EXAM: ULTRASOUND GUIDED DIAGNOSTIC AND THERAPEUTIC PARACENTESIS MEDICATIONS: None. COMPLICATIONS: None immediate. PROCEDURE: Procedure, benefits, and risks of procedure were discussed with patient. Written informed consent for procedure was obtained. Time out protocol followed. Adequate collection of ascites localized by ultrasound in RIGHT lower quadrant. Skin prepped and draped in usual sterile fashion. Skin and soft tissues anesthetized with 10 mL of 1% lidocaine. 5 [place] Yueh catheter placed into peritoneal cavity. 4.18 L of clear yellow fluid fluid aspirated by vacuum bottle suction. Procedure tolerated well by patient without immediate complication. FINDINGS: A total of approximately 4.18 L of ascitic fluid was removed. Samples were sent to the laboratory as requested by the clinical team. IMPRESSION: Successful ultrasound-guided paracentesis yielding 4.18 liters of peritoneal fluid. Electronically Signed   By: Lavonia Dana M.D.   On: 05/22/2016 12:40  [2 weeks]   Assessment: 55 year old male with history of ETOH abuse and cirrhosis, presenting with decompensated features including ascites, anasarca, electrolyte abnormalities. Discriminant function calculated at 30 on admission; therefore prednisolone not indicated. Viral markers negative. LVAP 11/1 with 4.18 liters removed, negative for SBP, cytology negative. Repeat LVAP 11/6 with 6 liters removed. Hyponatremia stable. Creatinine remains normal. Overall, anasarca has improved, continues to diurese slowly.   Lasix increased to 80 mg IV BID two days ago. Aldactone increased to 150m am and 2038mpm yesterday. If weight correct he has diuresed an additional 7 pounds in last 24 hours and total of 50 pounds this  admission.   Anemia: multifactorial without signs of overt GI bleeding. Hgb stable from several days ago.     Plan:  1#   Follow up on urine Na as available.  2#   Outpatient continued cirrhosis care. He will need Hep B vaccination. He is immune to Hep A.  3#    2 gram sodium diet.  4#    At discharge would consider lasix 4024mID and aldactone 100m30mD.  5#    Met-7 at five days post discharge  6#    [place].  7#    Will follow peripherally.  LeslLaureen OchswiBernarda CaffeykRegional Medical Centertroenterology Associates [phone] AM      LOS: 8 days

## 2016-05-30 DIAGNOSIS — R188 Other ascites: Secondary | ICD-10-CM

## 2016-05-30 LAB — CBC
HCT: 28.1 % — ABNORMAL LOW (ref 39.0–52.0)
Hemoglobin: 9.7 g/dL — ABNORMAL LOW (ref 13.0–17.0)
MCH: 34 pg (ref 26.0–34.0)
MCHC: 34.5 g/dL (ref 30.0–36.0)
MCV: 98.6 fL (ref 78.0–100.0)
Platelets: 295 10*3/uL (ref 150–400)
RBC: 2.85 MIL/uL — ABNORMAL LOW (ref 4.22–5.81)
RDW: 15.5 % (ref 11.5–15.5)
WBC: 8.6 10*3/uL (ref 4.0–10.5)

## 2016-05-30 LAB — BASIC METABOLIC PANEL
Anion gap: 7 (ref 5–15)
BUN: 11 mg/dL (ref 6–20)
CHLORIDE: 86 mmol/L — AB (ref 101–111)
CO2: 32 mmol/L (ref 22–32)
CREATININE: 0.73 mg/dL (ref 0.61–1.24)
Calcium: 9.1 mg/dL (ref 8.9–10.3)
GFR calc non Af Amer: >60 mL/min (ref >60)
GLUCOSE: 116 mg/dL — AB (ref 65–99)
Potassium: 3.7 mmol/L (ref 3.5–5.1)
Sodium: 125 mmol/L — ABNORMAL LOW (ref 135–145)

## 2016-05-30 NOTE — Progress Notes (Signed)
PROGRESS NOTE    Thomas Pratt  [id number] DOB: [birth year] DOA: 05/21/2016 PCP: No PCP Per Patient    Brief Narrative:  54 yom who hardly ever sees a physician with a history of hypertension who was being treated with aldactone and lasix, presents with complaints of worsening abdominal distention. He is a recovering alcoholic of one month. While in the ED, he was noted to be severely hypokalemic, anemic, and hyponatremic with leukocytosis. His albumin was also noted to be low at 2.2 and ammonia 49. He was started on IV potassium supplementation and IV fluids and admitted for further evaluation of alcoholic cirrhosis of the liver. After admission, he started to develop significant volume overload likely related to hypoalbuminemia and liver disease. GI has been following the patient. He has had 2 LVAP thus far. Patient remains volume overloaded and still requires further diuresis.  Assessment & Plan:   Active Problems:   Liver cirrhosis (HCC)   Hypoalbuminemia   Hyponatremia   Hypokalemia   HTN (hypertension)   Ascites   ALC (alcoholic liver cirrhosis) (HCC)   Lower extremity edema   Malnutrition of moderate degree  1. Hepatic cirrhosis. This is likely due to alcohol. Patient is a recovering alcoholic of one month. Abdominal CT with contrast shows a large amount of ascites and nodular hepatic contour. Will continue lasix and Aldactone due to volume overload and low sodium. GI was consulted and recommended a US paracentesis. Patient had paracentesis done 11/1 with removal of 4L of fluid. Fluid analysis didn't indicate SBP. Second LVAP was completed 11/6 with the removal of 6L of fluid. He will need outpatient follow up with GI and BMET in 1 week once ready for discharge 2. Anasarca. Related to hypoalbuminemia and liver disease. Continue with intravenous diuresis, with lasix and aldactone. He has had 2 LVAP thus far. Overall volume status is improving. Anticipate transition to oral  diuretics next AM 3. Hypokalemia. Replaced.  4. Hypervolemic Hyponatremia. Remains low and trending down.Continue lasix and fluid restriction, reassess next am.  5. Hypoalbuminemia. Likely related to liver disease. On albumin infusion 6. Macrocytic anemia. Hemoglobin was 11.6 on admission, now 9.4, trending down. There are no active signs of bleeding at this time. Likely related to alcohol consumption. Continue to follow.  7. Leukocytosis. White blood cell count was 11,100 on admission, now within normal limits.   DVT prophylaxis: Heparin injection  Code Status: FULL  Family Communication: No family bedside Disposition Plan: Discharge home once improved.    Consultants:   GI   Nutrition   Nephrology  Procedures:  Paracentesis 11/1 with removal of 4L  Paracentesis 11/6 with removal of 6L   Antimicrobials:   None    Subjective: No new complaints. Feels about the same.  Objective: Vitals:   05/29/16 2218 05/30/16 0500 05/30/16 0615 05/30/16 1514  BP: (!) 105/57  98/76 (!) 101/53  Pulse: 100  91 (!) 101  Resp: 16   18  Temp: 98.6 F (37 C)  98.2 F (36.8 C) 98.3 F (36.8 C)  TempSrc: Oral  Oral Oral  SpO2: 93%  93% 95%  Weight:  61.4 kg (135 lb 6 oz)    Height:        Intake/Output Summary (Last 24 hours) at 05/30/16 1559 Last data filed at 05/29/16 2110  Gross per 24 hour  Intake              123 ml  Output  0 ml  Net              123 ml   Filed Weights   05/28/16 0417 05/29/16 0500 05/30/16 0500  Weight: 67 kg (147 lb 12.8 oz) 63.8 kg (140 lb 9.6 oz) 61.4 kg (135 lb 6 oz)    Examination:  General exam: Appears calm and comfortable, in nad. Respiratory system: Clear to auscultation. Respiratory effort normal. Cardiovascular system: S1 & S2 heard, RRR. No JVD, murmurs, rubs, gallops or clicks. 1+ pedal edema. Gastrointestinal system: Abdomen is mildly distended, soft and nontender. No organomegaly or masses felt. Normal bowel sounds  heard. Central nervous system: Alert and oriented. No focal neurological deficits. Extremities: Symmetric 5 x 5 power. Skin: No rashes, lesions or ulcers Psychiatry: Judgement and insight appear normal. Mood & affect appropriate.     Data Reviewed: I have personally reviewed following labs and imaging studies  CBC:  Recent Labs Lab 05/24/16 0638 05/28/16 0635 05/30/16 0617  WBC 8.7 8.4 8.6  HGB 9.4* 9.4* 9.7*  HCT 27.9* 26.8* 28.1*  MCV 101.5* 99.6 98.6  PLT 191 215 [id number]   Basic Metabolic Panel:  Recent Labs Lab 05/26/16 0715 05/27/16 0700 05/28/16 0635 05/29/16 0623 05/30/16 0617  NA 129* 128* 128* 127* 125*  K 3.8 3.5 3.9 3.6 3.7  CL 90* 91* 89* 88* 86*  CO2 33* 32 33* 32 32  GLUCOSE 110* 111* 118* 115* 116*  BUN 10 9 8 9 11   CREATININE 0.63 0.56* 0.66 0.67 0.73  CALCIUM 8.6* 9.1 9.0 8.9 9.1   GFR: Estimated Creatinine Clearance: 89.5 mL/min (by C-G formula based on SCr of 0.73 mg/dL). Liver Function Tests:  Recent Labs Lab 05/24/16 0638 05/25/16 0634 05/27/16 0700  AST 44* 42* 40  ALT 16* 16* 15*  ALKPHOS 62 67 62  BILITOT 2.4* 2.2* 2.2*  PROT 6.7 6.6 7.0  ALBUMIN 2.0* 2.1* 2.9*   No results for input(s): LIPASE, AMYLASE in the last 168 hours.  Recent Labs Lab 05/24/16 0638 05/27/16 0700  AMMONIA 41* 24   Coagulation Profile:  Recent Labs Lab 05/25/16 0634  INR 1.39   Cardiac Enzymes: No results for input(s): CKTOTAL, CKMB, CKMBINDEX, TROPONINI in the last 168 hours. BNP (last 3 results) No results for input(s): PROBNP in the last 8760 hours. HbA1C: No results for input(s): HGBA1C in the last 72 hours. CBG: No results for input(s): GLUCAP in the last 168 hours. Lipid Profile: No results for input(s): CHOL, HDL, LDLCALC, TRIG, CHOLHDL, LDLDIRECT in the last 72 hours. Thyroid Function Tests: No results for input(s): TSH, T4TOTAL, FREET4, T3FREE, THYROIDAB in the last 72 hours. Anemia Panel: No results for input(s): VITAMINB12,  FOLATE, FERRITIN, TIBC, IRON, RETICCTPCT in the last 72 hours. Sepsis Labs: No results for input(s): PROCALCITON, LATICACIDVEN in the last 168 hours.  Recent Results (from the past 240 hour(s))  Acid Fast Smear (AFB)     Status: None   Collection Time: 05/22/16 11:30 AM  Result Value Ref Range Status   AFB Specimen Processing Concentration  Final   Acid Fast Smear Negative  Final    Comment: (NOTE) Performed At: [facility] [place], [place] [id number] Lindon Romp MD [id number]    Source (AFB) FLUID  Final    Comment: ASCITIC COLLECTED BY DOCTOR BOLES   Gram stain     Status: None   Collection Time: 05/22/16 11:30 AM  Result Value Ref Range Status   Specimen Description FLUID ASCITIC COLLECTED BY  DOCTOR  Final   Special Requests NONE  Final   Gram Stain   Final    CYTOSPIN SMEAR WBC PRESENT, PREDOMINANTLY MONONUCLEAR NO ORGANISMS SEEN Performed at [facility]    Report Status 05/22/2016 FINAL  Final  Culture, body fluid-bottle     Status: None   Collection Time: 05/22/16 11:30 AM  Result Value Ref Range Status   Specimen Description FLUID ASCITIC COLLECTED BY DOCTOR  Final   Special Requests BOTTLES DRAWN AEROBIC AND ANAEROBIC 10CC  Final   Culture NO GROWTH 5 DAYS  Final   Report Status 05/27/2016 FINAL  Final      Radiology Studies: No results found.   Scheduled Meds: . feeding supplement (ENSURE ENLIVE)  237 mL Oral BID BM  . furosemide  80 mg Intravenous BID  . heparin  5,000 Units Subcutaneous Q8H  . lactulose  10 g Oral BID  . sodium chloride flush  3 mL Intravenous Q12H  . spironolactone  100 mg Oral Q breakfast  . spironolactone  200 mg Oral Q2200   Continuous Infusions:   LOS: 9 days    Time spent: 25 minutes    Meenakshi Sazama  [facility] If 7PM-7AM, please contact night-coverage [email or web address] Password [email or web address] 05/30/2016, 3:59 PM

## 2016-05-30 NOTE — Discharge Instructions (Signed)
Need Blood work drawn on 06/04/2016 and have sent to Dr. Orvan Falconer office. Take prescription for blood work to [facility]:  Address: [address] [place], [place] [address]  [place], [place], [place], [place] [address]

## 2016-05-30 NOTE — Care Management Note (Signed)
Case Management Note  Patient Details  Name: Thomas Pratt MRN: [id number] Date of Birth: March 03, 1961  Expected Discharge Date:     05/30/2016             Expected Discharge Plan:  Home/Self Care  In-House Referral:  NA  Discharge planning Services  CM Consult  Post Acute Care Choice:  NA Choice offered to:  NA  Status of Service:  Completed, signed off  If discussed at [place] of Stay Meetings, dates discussed:  05/30/2016  Additional Comments: Pt anticipating DC home today. Pt has no PCP but will follow up closely with GI. Pt provided list of PCP's in area accepting new pt's. Pt is aware he must call and make arrangements for establishing new PCP care. Pt will needs f/u blood work prior ot GI appointment. Pt will be given Rx for lab work and knows to take Rx to local lab facility. No further CM needs anticipated.   Sherald Barge, RN 05/30/2016, 12:30 PM

## 2016-05-31 LAB — BASIC METABOLIC PANEL
Anion gap: 8 (ref 5–15)
Anion gap: 8 (ref 5–15)
BUN: 16 mg/dL (ref 6–20)
BUN: 15 mg/dL (ref 6–20)
CALCIUM: 9.5 mg/dL (ref 8.9–10.3)
CALCIUM: 9.2 mg/dL (ref 8.9–10.3)
CO2: 32 mmol/L (ref 22–32)
CO2: 31 mmol/L (ref 22–32)
Chloride: 84 mmol/L — ABNORMAL LOW (ref 101–111)
Chloride: 84 mmol/L — ABNORMAL LOW (ref 101–111)
Creatinine, Ser: 0.74 mg/dL (ref 0.61–1.24)
Creatinine, Ser: 0.84 mg/dL (ref 0.61–1.24)
GFR calc Af Amer: >60 mL/min (ref >60)
GFR calc Af Amer: >60 mL/min (ref >60)
GLUCOSE: 115 mg/dL — AB (ref 65–99)
GLUCOSE: 130 mg/dL — AB (ref 65–99)
POTASSIUM: 3.9 mmol/L (ref 3.5–5.1)
Potassium: 3.9 mmol/L (ref 3.5–5.1)
Sodium: 124 mmol/L — ABNORMAL LOW (ref 135–145)
Sodium: 123 mmol/L — ABNORMAL LOW (ref 135–145)

## 2016-05-31 MED ORDER — SODIUM CHLORIDE 1 G PO TABS
2.0000 g | ORAL_TABLET | Freq: Two times a day (BID) | ORAL | Status: DC
  Administered 2016-05-31 – 2016-06-01 (×3): 2 g via ORAL
  Filled 2016-05-31 (×7): qty 2

## 2016-05-31 MED ORDER — SODIUM CHLORIDE 1 G PO TABS: 2.0000 g | ORAL_TABLET | Freq: Two times a day (BID) | ORAL | Status: DC

## 2016-05-31 NOTE — Progress Notes (Signed)
PROGRESS NOTE    Thomas Pratt  [id number] DOB: 1960/09/12 DOA: 05/21/2016 PCP: No PCP Per Patient    Brief Narrative:  69 yom who hardly ever sees a physician with a history of hypertension who was being treated with aldactone and lasix, presents with complaints of worsening abdominal distention. He is a recovering alcoholic of one month. While in the ED, he was noted to be severely hypokalemic, anemic, and hyponatremic with leukocytosis. His albumin was also noted to be low at 2.2 and ammonia 49. He was started on IV potassium supplementation and IV fluids and admitted for further evaluation of alcoholic cirrhosis of the liver. After admission, he started to develop significant volume overload likely related to hypoalbuminemia and liver disease. GI has been following the patient. He has had 2 LVAP thus far. Patient remains volume overloaded and still requires further diuresis.  Assessment & Plan:   Active Problems:   Liver cirrhosis (HCC)   Hypoalbuminemia   Hyponatremia   Hypokalemia   HTN (hypertension)   Ascites   ALC (alcoholic liver cirrhosis) (HCC)   Lower extremity edema   Malnutrition of moderate degree  1. Hepatic cirrhosis. This is likely due to alcohol. Patient is a recovering alcoholic of one month. Abdominal CT with contrast shows a large amount of ascites and nodular hepatic contour. Will continue lasix and Aldactone due to volume overload and low sodium. GI was consulted and recommended a US paracentesis. Patient had paracentesis done 11/1 with removal of 4L of fluid. Fluid analysis didn't indicate SBP. Second LVAP was completed 11/6 with the removal of 6L of fluid. He will need outpatient follow up with GI and [facility]ET in 1 week once ready for discharge 2. Anasarca. Related to hypoalbuminemia and liver disease. Continue with intravenous diuresis, with lasix and aldactone. He has had 2 LVAP thus far. Overall volume status is improving. Anticipate transition to oral  diuretics next AM 3. Hypokalemia. Replaced.  4. Hypervolemic Hyponatremia. Remains low and trending down.Continue lasix and fluid restriction, start on sodium tablets and reassess next am.  5. Hypoalbuminemia. Likely related to liver disease. On albumin infusion 6. Macrocytic anemia. Hemoglobin was 11.6 on admission, now 9.4, trending down. There are no active signs of bleeding at this time. Likely related to alcohol consumption. Continue to follow.  7. Leukocytosis. White blood cell count was 11,100 on admission, now within normal limits.   DVT prophylaxis: Heparin injection  Code Status: FULL  Family Communication: No family bedside Disposition Plan: Discharge home once improved.    Consultants:   GI   Nutrition   Nephrology  Procedures:  Paracentesis 11/1 with removal of 4L  Paracentesis 11/6 with removal of 6L   Antimicrobials:   None    Subjective: No new complaints reported. Denies any worsening in edema  Objective: Vitals:   05/30/16 2013 05/30/16 2053 05/31/16 0601 05/31/16 1445  BP:  120/60 (!) 93/58 97/62  Pulse:  97 89 84  Resp:  18 18 18   Temp:  97.7 F (36.5 C) 98.1 F (36.7 C) 98.3 F (36.8 C)  TempSrc:  Oral Oral Oral  SpO2: 92% 97% 98% 97%  Weight:   61.4 kg (135 lb 5.9 oz)   Height:        Intake/Output Summary (Last 24 hours) at 05/31/16 1637 Last data filed at 05/31/16 1100  Gross per 24 hour  Intake              720 ml  Output  400 ml  Net              320 ml   Filed Weights   05/29/16 0500 05/30/16 0500 05/31/16 0601  Weight: 63.8 kg (140 lb 9.6 oz) 61.4 kg (135 lb 6 oz) 61.4 kg (135 lb 5.9 oz)    Examination:  General exam: Appears calm and comfortable, in nad. Respiratory system: Clear to auscultation. Respiratory effort normal. Cardiovascular system: S1 & S2 heard, RRR. No JVD, murmurs, rubs, gallops or clicks. 1+ pedal edema. Gastrointestinal system: Abdomen is mildly distended, soft and nontender. No  organomegaly or masses felt. Normal bowel sounds heard. Central nervous system: Alert and oriented. No focal neurological deficits. Extremities: Symmetric 5 x 5 power. Skin: No rashes, lesions or ulcers Psychiatry: Judgement and insight appear normal. Mood & affect appropriate.     Data Reviewed: I have personally reviewed following labs and imaging studies  CBC:  Recent Labs Lab 05/28/16 0635 05/30/16 0617  WBC 8.4 8.6  HGB 9.4* 9.7*  HCT 26.8* 28.1*  MCV 99.6 98.6  PLT 215 [id number]   Basic Metabolic Panel:  Recent Labs Lab 05/28/16 0635 05/29/16 0623 05/30/16 0617 05/31/16 0622 05/31/16 1405  NA 128* 127* 125* 124* 123*  K 3.9 3.6 3.7 3.9 3.9  CL 89* 88* 86* 84* 84*  CO2 33* 32 32 32 31  GLUCOSE 118* 115* 116* 115* 130*  BUN 8 9 11 16 15   CREATININE 0.66 0.67 0.73 0.74 0.84  CALCIUM 9.0 8.9 9.1 9.5 9.2   GFR: Estimated Creatinine Clearance: 85.3 mL/min (by C-G formula based on SCr of 0.84 mg/dL). Liver Function Tests:  Recent Labs Lab 05/25/16 0634 05/27/16 0700  AST 42* 40  ALT 16* 15*  ALKPHOS 67 62  BILITOT 2.2* 2.2*  PROT 6.6 7.0  ALBUMIN 2.1* 2.9*   No results for input(s): LIPASE, AMYLASE in the last 168 hours.  Recent Labs Lab 05/27/16 0700  AMMONIA 24   Coagulation Profile:  Recent Labs Lab 05/25/16 0634  INR 1.39   Cardiac Enzymes: No results for input(s): CKTOTAL, CKMB, CKMBINDEX, TROPONINI in the last 168 hours. BNP (last 3 results) No results for input(s): PROBNP in the last 8760 hours. HbA1C: No results for input(s): HGBA1C in the last 72 hours. CBG: No results for input(s): GLUCAP in the last 168 hours. Lipid Profile: No results for input(s): CHOL, HDL, LDLCALC, TRIG, CHOLHDL, LDLDIRECT in the last 72 hours. Thyroid Function Tests: No results for input(s): TSH, T4TOTAL, FREET4, T3FREE, THYROIDAB in the last 72 hours. Anemia Panel: No results for input(s): VITAMINB12, FOLATE, FERRITIN, TIBC, IRON, RETICCTPCT in the last 72  hours. Sepsis Labs: No results for input(s): PROCALCITON, LATICACIDVEN in the last 168 hours.  Recent Results (from the past 240 hour(s))  Acid Fast Smear (AFB)     Status: None   Collection Time: 05/22/16 11:30 AM  Result Value Ref Range Status   AFB Specimen Processing Concentration  Final   Acid Fast Smear Negative  Final    Comment: (NOTE) Performed At: [facility] [place], [place] [id number] Lindon Romp MD [id number]    Source (AFB) FLUID  Final    Comment: ASCITIC COLLECTED BY DOCTOR BOLES   Gram stain     Status: None   Collection Time: 05/22/16 11:30 AM  Result Value Ref Range Status   Specimen Description FLUID ASCITIC COLLECTED BY DOCTOR  Final   Special Requests NONE  Final   Gram Stain   Final  CYTOSPIN SMEAR WBC PRESENT, PREDOMINANTLY MONONUCLEAR NO ORGANISMS SEEN Performed at [facility]    Report Status 05/22/2016 FINAL  Final  Culture, body fluid-bottle     Status: None   Collection Time: 05/22/16 11:30 AM  Result Value Ref Range Status   Specimen Description FLUID ASCITIC COLLECTED BY DOCTOR  Final   Special Requests BOTTLES DRAWN AEROBIC AND ANAEROBIC 10CC  Final   Culture NO GROWTH 5 DAYS  Final   Report Status 05/27/2016 FINAL  Final      Radiology Studies: No results found.   Scheduled Meds: . feeding supplement (ENSURE ENLIVE)  237 mL Oral BID BM  . furosemide  80 mg Intravenous BID  . heparin  5,000 Units Subcutaneous Q8H  . lactulose  10 g Oral BID  . sodium chloride flush  3 mL Intravenous Q12H  . sodium chloride  2 g Oral BID WC  . spironolactone  100 mg Oral Q breakfast  . spironolactone  200 mg Oral Q2200   Continuous Infusions:   LOS: 10 days    Time spent: 25 minutes    Arun Herrod  [facility] If 7PM-7AM, please contact night-coverage [email or web address] Password [facility] 05/31/2016, 4:37 PM

## 2016-05-31 NOTE — Care Management Important Message (Signed)
Important Message  Patient Details  Name: Thomas Pratt MRN: [id number] Date of Birth: 1961-02-03   Medicare Important Message Given:  Yes    Thomasene Dubow, Chauncey Reading, RN 05/31/2016, 10:11 AM

## 2016-06-01 LAB — BASIC METABOLIC PANEL
Anion gap: 8 (ref 5–15)
BUN: 17 mg/dL (ref 6–20)
CHLORIDE: 86 mmol/L — AB (ref 101–111)
CO2: 32 mmol/L (ref 22–32)
CREATININE: 0.8 mg/dL (ref 0.61–1.24)
Calcium: 9.3 mg/dL (ref 8.9–10.3)
GFR calc Af Amer: >60 mL/min (ref >60)
GFR calc non Af Amer: >60 mL/min (ref >60)
GLUCOSE: 111 mg/dL — AB (ref 65–99)
Potassium: 3.8 mmol/L (ref 3.5–5.1)
Sodium: 126 mmol/L — ABNORMAL LOW (ref 135–145)

## 2016-06-01 MED ORDER — SPIRONOLACTONE 100 MG PO TABS: 100.0000 mg | ORAL_TABLET | Freq: Two times a day (BID) | ORAL | 0 refills | Status: DC

## 2016-06-01 MED ORDER — FUROSEMIDE 40 MG PO TABS: 40.0000 mg | ORAL_TABLET | Freq: Two times a day (BID) | ORAL | 0 refills | Status: DC

## 2016-06-01 MED ORDER — ENSURE ENLIVE PO LIQD: 237.0000 mL | Freq: Two times a day (BID) | ORAL | 12 refills | Status: DC

## 2016-06-01 NOTE — Discharge Summary (Signed)
Physician Discharge Summary  Thomas Pratt [id number] DOB: 1961/03/10 DOA: 05/21/2016  PCP: No PCP Per Patient  Admit date: 05/21/2016 Discharge date: 06/01/2016  Time spent: > 35 minutes  Recommendations for Outpatient Follow-up:  1. Monitor sodium levels   Discharge Diagnoses:  Active Problems:   Liver cirrhosis (HCC)   Hypoalbuminemia   Hyponatremia   Hypokalemia   HTN (hypertension)   Ascites   ALC (alcoholic liver cirrhosis) (HCC)   Lower extremity edema   Malnutrition of moderate degree   Discharge Condition: stable  Diet recommendation: 2 gm sodium per day  Filed Weights   05/30/16 0500 05/31/16 0601 06/01/16 0643  Weight: 61.4 kg (135 lb 6 oz) 61.4 kg (135 lb 5.9 oz) 59.2 kg (130 lb 9.6 oz)    History of present illness:  55 y.o. male with very benign PMH, but likely by virtue of hardly ever saw any physician, hx of hypertension, presented with complaints of worsening abdominal distention that onset one month ago.   Pt was admitted for management of alcoholic liver cirrhosis with ascites (discomfort as a result) and electrolyte abnormality  Hospital Course:  Hyponatremia - hypervolemic hyponatremia although I suspect patient also has poor oral solute intake. Improved with sodium administration as well in the form of tablets  Ascites - discharge on lasix and aldactone - will d/c on lasix 40 mg po bid and aldactone 100 mg po bid as per GI recommendations.  Malnutrition - will recommend nutritional supplementation with ensure.  Pt to f/u with GI specialist after discharge and obtain chem 7 within 5 days of d/c  Procedures:  Paracentesis 11/1 with removal of 4L  Paracentesis 11/6 with removal of 6L   Consultations:  GI Dr. Gala Romney  Discharge Exam: Vitals:   06/01/16 0643 06/01/16 0754  BP: 94/61 106/60  Pulse: 85 86  Resp: 20   Temp: 98 F (36.7 C)     General: Pt in nad, alert and awake Cardiovascular: rrr, no rubs Respiratory: no  increased wob, no wheezes  Discharge Instructions   Discharge Instructions    Call MD for:  difficulty breathing, headache or visual disturbances    Complete by:  As directed    Call MD for:  severe uncontrolled pain    Complete by:  As directed    Call MD for:  temperature >100.4    Complete by:  As directed    Call MD for:  temperature >100.4    Complete by:  As directed    Diet - low sodium heart healthy    Complete by:  As directed    Diet - low sodium heart healthy    Complete by:  As directed    Discharge instructions    Complete by:  As directed    Pt to f/u with gastroenterologist after discharge. He is to call and confirm appointment date and time.   Increase activity slowly    Complete by:  As directed    Increase activity slowly    Complete by:  As directed      Current Discharge Medication List    START taking these medications   Details  feeding supplement, ENSURE ENLIVE, (ENSURE ENLIVE) LIQD Take 237 mLs by mouth 2 (two) times daily between meals. Qty: 237 mL, Refills: 12      CONTINUE these medications which have CHANGED   Details  furosemide (LASIX) 40 MG tablet Take 1 tablet (40 mg total) by mouth 2 (two) times daily. Qty: 60 tablet, Refills: 0  spironolactone (ALDACTONE) 100 MG tablet Take 1 tablet (100 mg total) by mouth 2 (two) times daily. Qty: 60 tablet, Refills: 0       No Known Allergies Follow-up Information    Manus Rudd, MD Follow up in 2 week(s).   Specialty:  Gastroenterology Contact information: [address] [place] [address] [phone]        [facility] Follow up.   Why:    Need Blood work drawn on 06/04/2016 and have sent to Dr. Orvan Falconer office. Take prescription for blood work to [facility]:  Address: [address] [place], [place] [address]  [place], [place], [place], [place] [address]             The results of significant diagnostics from this hospitalization (including imaging, microbiology,  ancillary and laboratory) are listed below for reference.    Significant Diagnostic Studies: Dg Chest 2 View  Result Date: 05/21/2016 CLINICAL DATA:  Lower extremity edema with abdominal distention and dyspnea with exertion EXAM: CHEST  2 VIEW COMPARISON:  May 28, 2014 FINDINGS: Degree of inspiration is quite shallow. There is bibasilar atelectasis. Lungs elsewhere are clear. Heart size and pulmonary vascularity are normal. No adenopathy. No bone lesions. IMPRESSION: Shallow degree of inspiration. Bibasilar atelectasis. No edema or consolidation. Cardiac silhouette within normal limits. Electronically Signed   By: Lowella Grip III M.D.   On: 05/21/2016 10:24   Ct Abdomen Pelvis W Contrast  Result Date: 05/21/2016 CLINICAL DATA:  Severe abdominal distention.  Shortness of breath. EXAM: CT ABDOMEN AND PELVIS WITH CONTRAST TECHNIQUE: Multidetector CT imaging of the abdomen and pelvis was performed using the standard protocol following bolus administration of intravenous contrast. CONTRAST:  172mL ISOVUE-300 IOPAMIDOL (ISOVUE-300) INJECTION 61% COMPARISON:  None. FINDINGS: Lower chest: There is right lower lobe atelectasis/consolidation. No pleural effusion. Hepatobiliary: The liver is shrunken with a nodular contour. There is large volume ascites throughout the entire abdomen and pelvis. Small caliber recannulized umbilical vein. Pancreas: Pancreatic contours and enhancement are normal. Spleen: There are multiple areas of splenic hypoattenuation. The spleen is not enlarged. Adrenals/Urinary Tract: The adrenal glands are normal. Both kidneys are normal. Stomach/Bowel: No dilated small bowel or other evidence of obstruction. No enteric or colonic inflammation. The appendix is normal. No pneumobilia or portal venous gas. Vascular/Lymphatic: There is atherosclerotic calcification of the non aneurysmal abdominal aorta. The portal vein, superior mesenteric vein and splenic vein are patent. No abdominal or  pelvic lymphadenopathy identified. Reproductive: Prostate and seminal vesicles are unremarkable. Other: None Musculoskeletal: No bony spinal canal stenosis. No lytic or blastic osseous lesions. IMPRESSION: 1. Large amount of abdominal/pelvic ascites. 2. Nodular hepatic contour, suggesting hepatic cirrhosis. 3. Aortic atherosclerosis. 4. Right basilar atelectasis/consolidation. 5. Multiple areas of spine hypoattenuation may be small splenic infarcts related to underlying portal hypertension. Electronically Signed   By: Ulyses Jarred M.D.   On: 05/21/2016 19:48   US Paracentesis  Result Date: 05/29/2016 INDICATION: Cirrhosis with ascites. EXAM: ULTRASOUND GUIDED  PARACENTESIS MEDICATIONS: None. COMPLICATIONS: None immediate. PROCEDURE: Informed written consent was obtained from the patient after a discussion of the risks, benefits and alternatives to treatment. A timeout was performed prior to the initiation of the procedure. Initial ultrasound scanning demonstrates a large amount of ascites within the right lower abdominal quadrant. The right lower abdomen was prepped and draped in the usual sterile fashion. 1% lidocaine with epinephrine was used for local anesthesia. Following this, a Yueh catheter was introduced. An ultrasound image was saved for documentation purposes. The paracentesis  was performed. The catheter was removed and a dressing was applied. The patient tolerated the procedure well without immediate post procedural complication. FINDINGS: A total of approximately 6000 cc of yellow fluid was removed. Samples were sent to the laboratory as requested by the clinical team. IMPRESSION: Successful ultrasound-guided paracentesis yielding 6 liters of peritoneal fluid. Electronically Signed   By: Lorriane Shire M.D.   On: 05/29/2016 09:27   US Paracentesis  Result Date: 05/22/2016 INDICATION: Ascites, cirrhosis EXAM: ULTRASOUND GUIDED DIAGNOSTIC AND THERAPEUTIC PARACENTESIS MEDICATIONS: None.  COMPLICATIONS: None immediate. PROCEDURE: Procedure, benefits, and risks of procedure were discussed with patient. Written informed consent for procedure was obtained. Time out protocol followed. Adequate collection of ascites localized by ultrasound in RIGHT lower quadrant. Skin prepped and draped in usual sterile fashion. Skin and soft tissues anesthetized with 10 mL of 1% lidocaine. 5 [place] Yueh catheter placed into peritoneal cavity. 4.18 L of clear yellow fluid fluid aspirated by vacuum bottle suction. Procedure tolerated well by patient without immediate complication. FINDINGS: A total of approximately 4.18 L of ascitic fluid was removed. Samples were sent to the laboratory as requested by the clinical team. IMPRESSION: Successful ultrasound-guided paracentesis yielding 4.18 liters of peritoneal fluid. Electronically Signed   By: Lavonia Dana M.D.   On: 05/22/2016 12:40    Microbiology: No results found for this or any previous visit (from the past 240 hour(s)).   Labs: Basic Metabolic Panel:  Recent Labs Lab 05/29/16 0623 05/30/16 0617 05/31/16 0622 05/31/16 1405 06/01/16 0633  NA 127* 125* 124* 123* 126*  K 3.6 3.7 3.9 3.9 3.8  CL 88* 86* 84* 84* 86*  CO2 32 32 32 31 32  GLUCOSE 115* 116* 115* 130* 111*  BUN 9 11 16 15 17   CREATININE 0.67 0.73 0.74 0.84 0.80  CALCIUM 8.9 9.1 9.5 9.2 9.3   Liver Function Tests:  Recent Labs Lab 05/27/16 0700  AST 40  ALT 15*  ALKPHOS 62  BILITOT 2.2*  PROT 7.0  ALBUMIN 2.9*   No results for input(s): LIPASE, AMYLASE in the last 168 hours.  Recent Labs Lab 05/27/16 0700  AMMONIA 24   CBC:  Recent Labs Lab 05/28/16 0635 05/30/16 0617  WBC 8.4 8.6  HGB 9.4* 9.7*  HCT 26.8* 28.1*  MCV 99.6 98.6  PLT 215 295   Cardiac Enzymes: No results for input(s): CKTOTAL, CKMB, CKMBINDEX, TROPONINI in the last 168 hours. BNP: BNP (last 3 results)  Recent Labs  05/21/16 0925  BNP 36.0    ProBNP (last 3 results) No results for  input(s): PROBNP in the last 8760 hours.  CBG: No results for input(s): GLUCAP in the last 168 hours.   Signed:  Velvet Bathe MD.  [facility] 06/01/2016, 2:11 PM

## 2016-06-03 ENCOUNTER — Encounter: Payer: Self-pay | Admitting: Gastroenterology

## 2016-06-03 ENCOUNTER — Telehealth: Payer: Self-pay | Admitting: Gastroenterology

## 2016-06-03 ENCOUNTER — Other Ambulatory Visit: Payer: Self-pay

## 2016-06-03 DIAGNOSIS — R188 Other ascites: Principal | ICD-10-CM

## 2016-06-03 DIAGNOSIS — K7460 Unspecified cirrhosis of liver: Secondary | ICD-10-CM

## 2016-06-03 NOTE — Telephone Encounter (Signed)
Patient needs labs on Thursday, Met-7. OV in 2 weeks. Hospital f/u.

## 2016-06-03 NOTE — Telephone Encounter (Signed)
Orders are done and I have mailed them to the pt.

## 2016-06-03 NOTE — Telephone Encounter (Signed)
APPT MADE AND LETTER SENT  °

## 2016-06-03 NOTE — Addendum Note (Signed)
Addended by: Claudina Lick on: 06/03/2016 03:14 PM   Modules accepted: Orders

## 2016-06-04 ENCOUNTER — Other Ambulatory Visit (HOSPITAL_COMMUNITY)
Admission: RE | Admit: 2016-06-04 | Discharge: 2016-06-04 | Disposition: A | Discharge status: Home / Self Care | Payer: 59 | Source: Ambulatory Visit | Attending: Family Medicine | Admitting: Family Medicine

## 2016-06-04 DIAGNOSIS — K7460 Unspecified cirrhosis of liver: Secondary | ICD-10-CM | POA: Diagnosis not present

## 2016-06-04 LAB — BASIC METABOLIC PANEL
ANION GAP: 6 (ref 5–15)
BUN: 19 mg/dL (ref 6–20)
CO2: 28 mmol/L (ref 22–32)
Calcium: 9.3 mg/dL (ref 8.9–10.3)
Chloride: 93 mmol/L — ABNORMAL LOW (ref 101–111)
Creatinine, Ser: 0.87 mg/dL (ref 0.61–1.24)
Glucose, Bld: 125 mg/dL — ABNORMAL HIGH (ref 65–99)
POTASSIUM: 4.9 mmol/L (ref 3.5–5.1)
SODIUM: 127 mmol/L — AB (ref 135–145)

## 2016-06-18 ENCOUNTER — Encounter: Payer: Self-pay | Admitting: Internal Medicine

## 2016-06-18 ENCOUNTER — Other Ambulatory Visit (HOSPITAL_COMMUNITY)
Admission: RE | Admit: 2016-06-18 | Discharge: 2016-06-18 | Disposition: A | Discharge status: Home / Self Care | Payer: 59 | Source: Ambulatory Visit | Attending: Gastroenterology | Admitting: Gastroenterology

## 2016-06-18 ENCOUNTER — Encounter: Payer: Self-pay | Admitting: Gastroenterology

## 2016-06-18 ENCOUNTER — Ambulatory Visit (INDEPENDENT_AMBULATORY_CARE_PROVIDER_SITE_OTHER): Payer: 59 | Admitting: Gastroenterology

## 2016-06-18 VITALS — BP 96/60 | HR 80 | Temp 97.6°F | Ht 68.0 in | Wt 137.8 lb

## 2016-06-18 DIAGNOSIS — E871 Hypo-osmolality and hyponatremia: Secondary | ICD-10-CM | POA: Insufficient documentation

## 2016-06-18 DIAGNOSIS — K7031 Alcoholic cirrhosis of liver with ascites: Secondary | ICD-10-CM

## 2016-06-18 LAB — CBC WITH DIFFERENTIAL/PLATELET
BASOS ABS: 0 10*3/uL (ref 0.0–0.1)
Basophils Relative: 0 %
EOS ABS: 0.2 10*3/uL (ref 0.0–0.7)
EOS PCT: 2 %
HCT: 32.1 % — ABNORMAL LOW (ref 39.0–52.0)
HEMOGLOBIN: 11.1 g/dL — AB (ref 13.0–17.0)
LYMPHS ABS: 2.3 10*3/uL (ref 0.7–4.0)
LYMPHS PCT: 24 %
MCH: 33.8 pg (ref 26.0–34.0)
MCHC: 34.6 g/dL (ref 30.0–36.0)
MCV: 97.9 fL (ref 78.0–100.0)
Monocytes Absolute: 0.7 10*3/uL (ref 0.1–1.0)
Monocytes Relative: 7 %
NEUTROS PCT: 67 %
Neutro Abs: 6.7 10*3/uL (ref 1.7–7.7)
PLATELETS: 309 10*3/uL (ref 150–400)
RBC: 3.28 MIL/uL — AB (ref 4.22–5.81)
RDW: 15.3 % (ref 11.5–15.5)
WBC: 9.9 10*3/uL (ref 4.0–10.5)

## 2016-06-18 LAB — COMPREHENSIVE METABOLIC PANEL
ALK PHOS: 82 U/L (ref 38–126)
ALT: 21 U/L (ref 17–63)
AST: 37 U/L (ref 15–41)
Albumin: 3.3 g/dL — ABNORMAL LOW (ref 3.5–5.0)
Anion gap: 8 (ref 5–15)
BUN: 19 mg/dL (ref 6–20)
CALCIUM: 9.8 mg/dL (ref 8.9–10.3)
CHLORIDE: 93 mmol/L — AB (ref 101–111)
CO2: 26 mmol/L (ref 22–32)
CREATININE: 0.96 mg/dL (ref 0.61–1.24)
GFR calc Af Amer: >60 mL/min (ref >60)
GFR calc non Af Amer: >60 mL/min (ref >60)
GLUCOSE: 129 mg/dL — AB (ref 65–99)
Potassium: 4.4 mmol/L (ref 3.5–5.1)
SODIUM: 127 mmol/L — AB (ref 135–145)
Total Bilirubin: 1.2 mg/dL (ref 0.3–1.2)
Total Protein: 9.4 g/dL — ABNORMAL HIGH (ref 6.5–8.1)

## 2016-06-18 NOTE — Patient Instructions (Signed)
1. Please have your lab work done today. We will contact you with results. Based on findings, we may decrease your fluid pills. 2. Continue to avoid alcohol use. 3. Continue low-salt diet. 4. Return to the office in 2 months. Call sooner if you notice your legs or stomach or swelling.    Low-Sodium Eating Plan Sodium raises blood pressure and causes water to be held in the body. Getting less sodium from food will help lower your blood pressure, reduce any swelling, and protect your heart, liver, and kidneys. We get sodium by adding salt (sodium chloride) to food. Most of our sodium comes from canned, boxed, and frozen foods. Restaurant foods, fast foods, and pizza are also very high in sodium. Even if you take medicine to lower your blood pressure or to reduce fluid in your body, getting less sodium from your food is important. What is my plan? Most people should limit their sodium intake to 2,300 mg a day. Your health care provider recommends that you limit your sodium intake to ____2000 mg ______ a day. What do I need to know about this eating plan? For the low-sodium eating plan, you will follow these general guidelines:  Choose foods with a % Daily Value for sodium of less than 5% (as listed on the food label).  Use salt-free seasonings or herbs instead of table salt or sea salt.  Check with your health care provider or pharmacist before using salt substitutes.  Eat fresh foods.  Eat more vegetables and fruits.  Limit canned vegetables. If you do use them, rinse them well to decrease the sodium.  Limit cheese to 1 oz (28 g) per day.  Eat lower-sodium products, often labeled as "lower sodium" or "no salt added."  Avoid foods that contain monosodium glutamate (MSG). MSG is sometimes added to [place] food and some canned foods.  Check food labels (Nutrition Facts labels) on foods to learn how much sodium is in one serving.  Eat more home-cooked food and less restaurant, buffet, and  fast food.  When eating at a restaurant, ask that your food be prepared with less salt, or no salt if possible. How do I read food labels for sodium information? The Nutrition Facts label lists the amount of sodium in one serving of the food. If you eat more than one serving, you must multiply the listed amount of sodium by the number of servings. Food labels may also identify foods as:  Sodium free-Less than 5 mg in a serving.  Very low sodium-35 mg or less in a serving.  Low sodium-140 mg or less in a serving.  Light in sodium-50% less sodium in a serving. For example, if a food that usually has 300 mg of sodium is changed to become light in sodium, it will have 150 mg of sodium.  Reduced sodium-25% less sodium in a serving. For example, if a food that usually has 400 mg of sodium is changed to reduced sodium, it will have 300 mg of sodium. What foods can I eat? Grains  Low-sodium cereals, including oats, puffed wheat and rice, and shredded wheat cereals. Low-sodium crackers. Unsalted rice and pasta. Lower-sodium bread. Vegetables  Frozen or fresh vegetables. Low-sodium or reduced-sodium canned vegetables. Low-sodium or reduced-sodium tomato sauce and paste. Low-sodium or reduced-sodium tomato and vegetable juices. Fruits  Fresh, frozen, and canned fruit. Fruit juice. Meat and Other Protein Products  Low-sodium canned tuna and salmon. Fresh or frozen meat, poultry, seafood, and fish. Lamb. Unsalted nuts. Dried beans,  peas, and lentils without added salt. Unsalted canned beans. Homemade soups without salt. Eggs. Dairy  Milk. Soy milk. Ricotta cheese. Low-sodium or reduced-sodium cheeses. Yogurt. Condiments  Fresh and dried herbs and spices. Salt-free seasonings. Onion and garlic powders. Low-sodium varieties of mustard and ketchup. Fresh or refrigerated horseradish. Lemon juice. Fats and Oils  Reduced-sodium salad dressings. Unsalted butter. Other  Unsalted popcorn and pretzels. The  items listed above may not be a complete list of recommended foods or beverages. Contact your dietitian for more options.  What foods are not recommended? Grains  Instant hot cereals. Bread stuffing, pancake, and biscuit mixes. Croutons. Seasoned rice or pasta mixes. Noodle soup cups. Boxed or frozen macaroni and cheese. Self-rising flour. Regular salted crackers. Vegetables  Regular canned vegetables. Regular canned tomato sauce and paste. Regular tomato and vegetable juices. Frozen vegetables in sauces. Salted [place] fries. Olives. Angie Fava. Relishes. Sauerkraut. Salsa. Meat and Other Protein Products  Salted, canned, smoked, spiced, or pickled meats, seafood, or fish. Bacon, ham, sausage, hot dogs, corned beef, chipped beef, and packaged luncheon meats. Salt pork. Jerky. Pickled herring. Anchovies, regular canned tuna, and sardines. Salted nuts. Dairy  Processed cheese and cheese spreads. Cheese curds. Blue cheese and cottage cheese. Buttermilk. Condiments  Onion and garlic salt, seasoned salt, table salt, and sea salt. Canned and packaged gravies. Worcestershire sauce. Tartar sauce. Barbecue sauce. Teriyaki sauce. Soy sauce, including reduced sodium. Steak sauce. Fish sauce. Oyster sauce. Cocktail sauce. Horseradish that you find on the shelf. Regular ketchup and mustard. Meat flavorings and tenderizers. Bouillon cubes. Hot sauce. Tabasco sauce. Marinades. Taco seasonings. Relishes. Fats and Oils  Regular salad dressings. Salted butter. Margarine. Ghee. Bacon fat. Other  Potato and tortilla chips. Corn chips and puffs. Salted popcorn and pretzels. Canned or dried soups. Pizza. Frozen entrees and pot pies. The items listed above may not be a complete list of foods and beverages to avoid. Contact your dietitian for more information.  This information is not intended to replace advice given to you by your health care provider. Make sure you discuss any questions you have with your health care  provider. Document Released: 12/28/2001 Document Revised: 12/14/2015 Document Reviewed: 05/12/2013 Elsevier Interactive Patient Education  2017 [employer].

## 2016-06-18 NOTE — Progress Notes (Addendum)
Primary Care Physician: No PCP Per Patient  Primary Gastroenterologist:  Garfield Cornea, MD   Chief Complaint  Patient presents with  . Cirrhosis    HPI: Thomas Pratt is a 55 y.o. male hereFor hospital follow-up. He presented on 05/21/2016 for admission related to decompensated cirrhosis, alcoholic hepatitis. Previously has seen Dr. Britta Mccreedy but preferred to follow-up with [place] at [facility] gastroenterology. When he presented to the hospital his MELD Na was 24. DF 30. Viral markers negative. Needs vaccination for Hep B. CT abdomen pelvis with contrast during admission showed nodular cirrhotic liver, large volume ascites. Spleen not enlarged. He underwent 2 abdominal paracenteses during admission, initial one was over 4 L of fluid removed, second one X liters of fluid removed. No evidence of SBP. Cytology was negative. Approximate 50 pounds of fluid diuresed during admission.  Patient presents today stating he is doing much better. He states he's had no alcohol since September 2017. Currently taking Lasix 40 mg twice daily, Aldactone 100 mg twice daily. Minimal fluid retention at this point. Appetite improving. No complaints such as abdominal pain. Bowel movement 2-3 times daily no issues with confusion or somnolence. No heartburn or indigestion. No melena rectal bleeding.  No prior colonoscopy or upper endoscopy.  Current Outpatient Prescriptions  Medication Sig Dispense Refill  . furosemide (LASIX) 40 MG tablet Take 1 tablet (40 mg total) by mouth 2 (two) times daily. 60 tablet 0  . spironolactone (ALDACTONE) 100 MG tablet Take 1 tablet (100 mg total) by mouth 2 (two) times daily. 60 tablet 0   No current facility-administered medications for this visit.     Allergies as of 06/18/2016  . (No Known Allergies)   Past Medical History:  Diagnosis Date  . Cirrhosis ([place])    likely ETOH use   . Hypertension    Past Surgical History:  Procedure Laterality Date  . NO PAST  SURGERIES     Family History  Problem Relation Age of Onset  . Liver disease Neg Hx   . Colon cancer Neg Hx   . Colon polyps Neg Hx    Social History   Social History  . Marital status: Divorced    Spouse name: N/A  . Number of children: N/A  . Years of education: N/A   Social History Main Topics  . Smoking status: Never Smoker  . Smokeless tobacco: Never Used  . Alcohol use No     Comment: Quit September 2017, heavy alcohol abuse previously  . Drug use: No  . Sexual activity: Not Currently   Other Topics Concern  . None   Social History Narrative  . None    ROS:  General: Negative for anorexia, weight loss, fever, chills, fatigue, weakness. ENT: Negative for hoarseness, difficulty swallowing , nasal congestion. CV: Negative for chest pain, angina, palpitations, dyspnea on exertion, peripheral edema.  Respiratory: Negative for dyspnea at rest, dyspnea on exertion, cough, sputum, wheezing.  GI: See history of present illness. GU:  Negative for dysuria, hematuria, urinary incontinence, urinary frequency, nocturnal urination.  Endo: Negative for unusual weight change.    Physical Examination:   BP 96/60   Pulse 80   Temp 97.6 F (36.4 C) (Oral)   Ht 5\' 8"  (1.727 m)   Wt 137 lb 12.8 oz (62.5 kg)   BMI 20.95 kg/m   General: Well-nourished, well-developed in no acute distress.  Eyes: No icterus. Mouth: Oropharyngeal mucosa moist and pink , no lesions erythema or exudate. Lungs: Clear to  auscultation bilaterally.  Heart: Regular rate and rhythm, no murmurs rubs or gallops.  Abdomen: Bowel sounds are normal, nontender, nondistended, no hepatosplenomegaly or masses, no abdominal bruits or hernia , no rebound or guarding.   Extremities: Trace lower extremity edema. No clubbing or deformities. Neuro: Alert and oriented x 4   Skin: Warm and dry, no jaundice.   Psych: Alert and cooperative, normal mood and affect.  Labs from November 14 sodium 127, glucose 125, BUN  19, creatinine 0.87, potassium 4.9.   Labs from November 9 hemoglobin 9.7, hematocrit 28.1, MCV 98.6 and white blood cell count 8600, platelets 295,000  Labs from November 6, total bilirubin 2.2, alkaline phosphatase 62, AST 40, ALT 15, albumin 2.9  Imaging Studies: Dg Chest 2 View  Result Date: 05/21/2016 CLINICAL DATA:  Lower extremity edema with abdominal distention and dyspnea with exertion EXAM: CHEST  2 VIEW COMPARISON:  May 28, 2014 FINDINGS: Degree of inspiration is quite shallow. There is bibasilar atelectasis. Lungs elsewhere are clear. Heart size and pulmonary vascularity are normal. No adenopathy. No bone lesions. IMPRESSION: Shallow degree of inspiration. Bibasilar atelectasis. No edema or consolidation. Cardiac silhouette within normal limits. Electronically Signed   By: Lowella Grip III M.D.   On: 05/21/2016 10:24   Ct Abdomen Pelvis W Contrast  Result Date: 05/21/2016 CLINICAL DATA:  Severe abdominal distention.  Shortness of breath. EXAM: CT ABDOMEN AND PELVIS WITH CONTRAST TECHNIQUE: Multidetector CT imaging of the abdomen and pelvis was performed using the standard protocol following bolus administration of intravenous contrast. CONTRAST:  11mL ISOVUE-300 IOPAMIDOL (ISOVUE-300) INJECTION 61% COMPARISON:  None. FINDINGS: Lower chest: There is right lower lobe atelectasis/consolidation. No pleural effusion. Hepatobiliary: The liver is shrunken with a nodular contour. There is large volume ascites throughout the entire abdomen and pelvis. Small caliber recannulized umbilical vein. Pancreas: Pancreatic contours and enhancement are normal. Spleen: There are multiple areas of splenic hypoattenuation. The spleen is not enlarged. Adrenals/Urinary Tract: The adrenal glands are normal. Both kidneys are normal. Stomach/Bowel: No dilated small bowel or other evidence of obstruction. No enteric or colonic inflammation. The appendix is normal. No pneumobilia or portal venous gas.  Vascular/Lymphatic: There is atherosclerotic calcification of the non aneurysmal abdominal aorta. The portal vein, superior mesenteric vein and splenic vein are patent. No abdominal or pelvic lymphadenopathy identified. Reproductive: Prostate and seminal vesicles are unremarkable. Other: None Musculoskeletal: No bony spinal canal stenosis. No lytic or blastic osseous lesions. IMPRESSION: 1. Large amount of abdominal/pelvic ascites. 2. Nodular hepatic contour, suggesting hepatic cirrhosis. 3. Aortic atherosclerosis. 4. Right basilar atelectasis/consolidation. 5. Multiple areas of spine hypoattenuation may be small splenic infarcts related to underlying portal hypertension. Electronically Signed   By: Ulyses Jarred M.D.   On: 05/21/2016 19:48   US Paracentesis  Result Date: 05/29/2016 INDICATION: Cirrhosis with ascites. EXAM: ULTRASOUND GUIDED  PARACENTESIS MEDICATIONS: None. COMPLICATIONS: None immediate. PROCEDURE: Informed written consent was obtained from the patient after a discussion of the risks, benefits and alternatives to treatment. A timeout was performed prior to the initiation of the procedure. Initial ultrasound scanning demonstrates a large amount of ascites within the right lower abdominal quadrant. The right lower abdomen was prepped and draped in the usual sterile fashion. 1% lidocaine with epinephrine was used for local anesthesia. Following this, a Yueh catheter was introduced. An ultrasound image was saved for documentation purposes. The paracentesis was performed. The catheter was removed and a dressing was applied. The patient tolerated the procedure well without immediate post procedural complication. FINDINGS: A  total of approximately 6000 cc of yellow fluid was removed. Samples were sent to the laboratory as requested by the clinical team. IMPRESSION: Successful ultrasound-guided paracentesis yielding 6 liters of peritoneal fluid. Electronically Signed   By: Lorriane Shire M.D.   On:  05/29/2016 09:27   US Paracentesis  Result Date: 05/22/2016 INDICATION: Ascites, cirrhosis EXAM: ULTRASOUND GUIDED DIAGNOSTIC AND THERAPEUTIC PARACENTESIS MEDICATIONS: None. COMPLICATIONS: None immediate. PROCEDURE: Procedure, benefits, and risks of procedure were discussed with patient. Written informed consent for procedure was obtained. Time out protocol followed. Adequate collection of ascites localized by ultrasound in RIGHT lower quadrant. Skin prepped and draped in usual sterile fashion. Skin and soft tissues anesthetized with 10 mL of 1% lidocaine. 5 [place] Yueh catheter placed into peritoneal cavity. 4.18 L of clear yellow fluid fluid aspirated by vacuum bottle suction. Procedure tolerated well by patient without immediate complication. FINDINGS: A total of approximately 4.18 L of ascitic fluid was removed. Samples were sent to the laboratory as requested by the clinical team. IMPRESSION: Successful ultrasound-guided paracentesis yielding 4.18 liters of peritoneal fluid. Electronically Signed   By: Lavonia Dana M.D.   On: 05/22/2016 12:40

## 2016-06-24 ENCOUNTER — Encounter: Payer: Self-pay | Admitting: Gastroenterology

## 2016-06-24 NOTE — Assessment & Plan Note (Addendum)
55 year old gentleman with history of decompensated alcoholic liver disease/cirrhosis who presents for hospital follow-up. Recent hospitalization for significant anasarca. Underwent 2 LVAPs as outlined. Required high-dose diuretic regimen for significant diuresis. Presents today with trace lower extremity edema, no significant ascites. Due for repeat labs at this time to follow-up on hyponatremia. Likely will adjust diuretic regimen in light of minimal edema. Reiterated 2 g sodium diet with patient provided handout.  In the near future, would consider upper endoscopy to screen for esophageal variceal bleeding. He will require hepatitis B vaccination. We'll make arrangements for this in the upcoming future. Plan for follow-up OV in 2 months. Patient to call us sooner if he develops recurrent edema. Continue alcohol cessation. Discussed need for etoh avoidance given potential liver transplant needs.

## 2016-06-24 NOTE — Progress Notes (Signed)
No pcp per patient 

## 2016-06-24 NOTE — Progress Notes (Signed)
Please let patient know his H/H is improved. His sodium is low but stable.   I would recommend decreasing lasix to 40mg  ONCE daily and spironolactone 100mg  ONCE daily.  Please arrange for him to get Hep B vaccination.  He will need EGD with possible variceal banding and TCS in the OR with rmr for (screening for varices/anemia) at some point in future. We can plan now or wait until after his next OV when hes had some time to recover from recent hospitalization. Let me know what he prefers.

## 2016-07-05 LAB — ACID FAST CULTURE WITH REFLEXED SENSITIVITIES (MYCOBACTERIA): Acid Fast Culture: NEGATIVE

## 2016-07-23 ENCOUNTER — Emergency Department (HOSPITAL_COMMUNITY)
Admission: EM | Admit: 2016-07-23 | Discharge: 2016-07-23 | Disposition: A | Discharge status: Home / Self Care | Payer: 59 | Attending: Emergency Medicine | Admitting: Emergency Medicine

## 2016-07-23 ENCOUNTER — Telehealth: Payer: Self-pay

## 2016-07-23 ENCOUNTER — Emergency Department (HOSPITAL_COMMUNITY): Payer: 59

## 2016-07-23 ENCOUNTER — Encounter (HOSPITAL_COMMUNITY): Payer: Self-pay | Admitting: Emergency Medicine

## 2016-07-23 DIAGNOSIS — R1084 Generalized abdominal pain: Secondary | ICD-10-CM | POA: Insufficient documentation

## 2016-07-23 DIAGNOSIS — R609 Edema, unspecified: Secondary | ICD-10-CM

## 2016-07-23 DIAGNOSIS — R188 Other ascites: Principal | ICD-10-CM

## 2016-07-23 DIAGNOSIS — R600 Localized edema: Secondary | ICD-10-CM | POA: Diagnosis not present

## 2016-07-23 DIAGNOSIS — I10 Essential (primary) hypertension: Secondary | ICD-10-CM | POA: Diagnosis not present

## 2016-07-23 DIAGNOSIS — R0600 Dyspnea, unspecified: Secondary | ICD-10-CM | POA: Diagnosis present

## 2016-07-23 DIAGNOSIS — K7460 Unspecified cirrhosis of liver: Secondary | ICD-10-CM

## 2016-07-23 LAB — COMPREHENSIVE METABOLIC PANEL
ALBUMIN: 2.8 g/dL — AB (ref 3.5–5.0)
ALK PHOS: 84 U/L (ref 38–126)
ALT: 16 U/L — ABNORMAL LOW (ref 17–63)
ANION GAP: 5 (ref 5–15)
AST: 35 U/L (ref 15–41)
BILIRUBIN TOTAL: 1.1 mg/dL (ref 0.3–1.2)
BUN: 7 mg/dL (ref 6–20)
CALCIUM: 9 mg/dL (ref 8.9–10.3)
CO2: 26 mmol/L (ref 22–32)
Chloride: 102 mmol/L (ref 101–111)
Creatinine, Ser: 0.68 mg/dL (ref 0.61–1.24)
GFR calc Af Amer: >60 mL/min (ref >60)
GFR calc non Af Amer: >60 mL/min (ref >60)
GLUCOSE: 112 mg/dL — AB (ref 65–99)
Potassium: 3.5 mmol/L (ref 3.5–5.1)
SODIUM: 133 mmol/L — AB (ref 135–145)
TOTAL PROTEIN: 7.8 g/dL (ref 6.5–8.1)

## 2016-07-23 LAB — CBC WITH DIFFERENTIAL/PLATELET
BASOS ABS: 0 10*3/uL (ref 0.0–0.1)
BASOS PCT: 0 %
EOS ABS: 0.1 10*3/uL (ref 0.0–0.7)
Eosinophils Relative: 2 %
HEMATOCRIT: 32.2 % — AB (ref 39.0–52.0)
HEMOGLOBIN: 10.8 g/dL — AB (ref 13.0–17.0)
Lymphocytes Relative: 23 %
Lymphs Abs: 1.5 10*3/uL (ref 0.7–4.0)
MCH: 33.2 pg (ref 26.0–34.0)
MCHC: 33.5 g/dL (ref 30.0–36.0)
MCV: 99.1 fL (ref 78.0–100.0)
Monocytes Absolute: 0.5 10*3/uL (ref 0.1–1.0)
Monocytes Relative: 8 %
NEUTROS ABS: 4.4 10*3/uL (ref 1.7–7.7)
NEUTROS PCT: 67 %
Platelets: 304 10*3/uL (ref 150–400)
RBC: 3.25 MIL/uL — AB (ref 4.22–5.81)
RDW: 14.9 % (ref 11.5–15.5)
WBC: 6.5 10*3/uL (ref 4.0–10.5)

## 2016-07-23 LAB — URINALYSIS, ROUTINE W REFLEX MICROSCOPIC
BACTERIA UA: NONE SEEN
BILIRUBIN URINE: NEGATIVE
GLUCOSE, UA: NEGATIVE mg/dL
Hgb urine dipstick: NEGATIVE
KETONES UR: NEGATIVE mg/dL
Leukocytes, UA: NEGATIVE
NITRITE: NEGATIVE
PH: 5 (ref 5.0–8.0)
PROTEIN: 30 mg/dL — AB
Specific Gravity, Urine: 1.027 (ref 1.005–1.030)

## 2016-07-23 LAB — LIPASE, BLOOD: Lipase: 31 U/L (ref 11–51)

## 2016-07-23 LAB — AMMONIA: AMMONIA: 16 umol/L (ref 9–35)

## 2016-07-23 LAB — TROPONIN I

## 2016-07-23 LAB — BRAIN NATRIURETIC PEPTIDE: B Natriuretic Peptide: 38 pg/mL (ref 0.0–100.0)

## 2016-07-23 MED ORDER — FUROSEMIDE 40 MG PO TABS: 40.0000 mg | ORAL_TABLET | Freq: Two times a day (BID) | ORAL | 0 refills | Status: DC

## 2016-07-23 MED ORDER — SPIRONOLACTONE 100 MG PO TABS: 100.0000 mg | ORAL_TABLET | Freq: Two times a day (BID) | ORAL | 0 refills | Status: DC

## 2016-07-23 NOTE — Telephone Encounter (Signed)
Pt called to inform us that he is on the way to the ER. His feet are very swollen and he is out of his fluid pills.

## 2016-07-23 NOTE — ED Triage Notes (Signed)
Patient complains of bilateral leg edema that extends to groin. Patient states he was seen here before and was given a diarrhetic but ran out of Rx at home.

## 2016-07-23 NOTE — ED Notes (Signed)
Pt states understanding of care given and follow up instructions.  Pt ambulated from ED with family to transport home

## 2016-07-23 NOTE — ED Provider Notes (Signed)
[facility] DEPT Provider Note   CSN: [id number] Arrival date & time: 07/23/16  1757     History   Chief Complaint Chief Complaint  Patient presents with  . Leg Swelling    HPI Thomas Pratt is a 56 y.o. male.  HPI patient presents with lower extremity swelling, abdominal swelling and dyspnea with exertion. States symptoms began roughly 2 weeks ago after running out of Lasix and spironolactone. Denies any cough or chest pain. No nausea or vomiting. Denies fever or chills. Recently admitted and diagnosed with alcoholic cirrhosis. Patient states he has an appointment to follow-up with gastroenterologist at the end of month.  Past Medical History:  Diagnosis Date  . Cirrhosis ([place])    likely ETOH use   . Hypertension     Patient Active Problem List   Diagnosis Date Noted  . Malnutrition of moderate degree 05/22/2016  . Lower extremity edema   . Liver cirrhosis ([place]) 05/21/2016  . Hypoalbuminemia 05/21/2016  . Hyponatremia 05/21/2016  . Hypokalemia 05/21/2016  . HTN (hypertension) 05/21/2016  . Ascites 05/21/2016  . ALC (alcoholic liver cirrhosis) ([place]) 05/21/2016    Past Surgical History:  Procedure Laterality Date  . NO PAST SURGERIES         Home Medications    Prior to Admission medications   Medication Sig Start Date End Date Taking? Authorizing Provider  furosemide (LASIX) 40 MG tablet Take 1 tablet (40 mg total) by mouth 2 (two) times daily. 07/23/16   Julianne Rice, MD  spironolactone (ALDACTONE) 100 MG tablet Take 1 tablet (100 mg total) by mouth 2 (two) times daily. 07/23/16   Julianne Rice, MD    Family History Family History  Problem Relation Age of Onset  . Liver disease Neg Hx   . Colon cancer Neg Hx   . Colon polyps Neg Hx     Social History Social History  Substance Use Topics  . Smoking status: Never Smoker  . Smokeless tobacco: Never Used  . Alcohol use No     Comment: Quit September 2017, heavy alcohol abuse previously      Allergies   Patient has no known allergies.   Review of Systems Review of Systems  Constitutional: Negative for chills and fever.  Respiratory: Positive for shortness of breath. Negative for cough and wheezing.   Cardiovascular: Positive for leg swelling. Negative for chest pain and palpitations.  Gastrointestinal: Positive for abdominal distention. Negative for abdominal pain, constipation, diarrhea, nausea and vomiting.  Genitourinary: Negative for dysuria, flank pain, frequency and hematuria.  Musculoskeletal: Negative for back pain, myalgias, neck pain and neck stiffness.  Skin: Negative for rash and wound.  Neurological: Negative for dizziness, weakness, light-headedness, numbness and headaches.  All other systems reviewed and are negative.    Physical Exam Updated Vital Signs BP 131/79 (BP Location: Right Arm)   Pulse 97   Temp 98.3 F (36.8 C) (Oral)   Resp (!) 30   Ht 5\' 8"  (1.727 m)   Wt 137 lb (62.1 kg)   SpO2 97%   BMI 20.83 kg/m   Physical Exam  Constitutional: He is oriented to person, place, and time. He appears well-developed and well-nourished. No distress.  HENT:  Head: Normocephalic and atraumatic.  Mouth/Throat: Oropharynx is clear and moist. No oropharyngeal exudate.  Eyes: EOM are normal. Pupils are equal, round, and reactive to light.  Neck: Normal range of motion. Neck supple.  Cardiovascular: Normal rate and regular rhythm.  Exam reveals no gallop and no friction rub.  No murmur heard. Pulmonary/Chest: Effort normal.  Diminished breath sounds bilateral bases  Abdominal: Soft. Bowel sounds are normal. He exhibits distension. There is no tenderness. There is no rebound and no guarding.  Diffuse abdominal distention without tenderness to palpation.  Musculoskeletal: Normal range of motion. He exhibits edema. He exhibits no tenderness.  3+ bilateral lower extremity edema. Distal pulses intact.  Neurological: He is alert and oriented to  person, place, and time.  Moving all extremities without deficit. Sensation intact.  Skin: Skin is warm and dry. Capillary refill takes less than 2 seconds. No rash noted. No erythema.  Slightly mottled appearance to extremities  Psychiatric: He has a normal mood and affect. His behavior is normal.  Nursing note and vitals reviewed.    ED Treatments / Results  Labs (all labs ordered are listed, but only abnormal results are displayed) Labs Reviewed  CBC WITH DIFFERENTIAL/PLATELET - Abnormal; Notable for the following:       Result Value   RBC 3.25 (*)    Hemoglobin 10.8 (*)    HCT 32.2 (*)    All other components within normal limits  COMPREHENSIVE METABOLIC PANEL - Abnormal; Notable for the following:    Sodium 133 (*)    Glucose, Bld 112 (*)    Albumin 2.8 (*)    ALT 16 (*)    All other components within normal limits  URINALYSIS, ROUTINE W REFLEX MICROSCOPIC - Abnormal; Notable for the following:    Color, Urine AMBER (*)    APPearance HAZY (*)    Protein, ur 30 (*)    All other components within normal limits  BRAIN NATRIURETIC PEPTIDE  LIPASE, BLOOD  TROPONIN I  AMMONIA    EKG  EKG Interpretation  Date/Time:  Tuesday July 23 2016 21:52:28 EST Ventricular Rate:  100 PR Interval:    QRS Duration: 87 QT Interval:  383 QTC Calculation: 494 R Axis:   4 Text Interpretation:  Sinus tachycardia Borderline prolonged QT interval Baseline wander in lead(s) V1 Confirmed by Lita Mains  MD, Evangelina Delancey ([id number]) on 07/23/2016 10:48:16 PM       Radiology Dg Chest 2 View  Result Date: 07/23/2016 CLINICAL DATA:  Initial evaluation for bilateral lower extremity edema for 2-3 days. Shortness of breath. EXAM: CHEST  2 VIEW COMPARISON:  Prior radiograph from 05/21/2016. FINDINGS: Cardiac and mediastinal silhouettes are stable in size and contour, and remain within normal limits. Lungs are hypoinflated with elevation of the right hemidiaphragm, similar to prior, and likely chronic.  Bibasilar atelectatic changes present. No significant pulmonary vascular congestion or edema. No pleural effusion. No consolidative airspace disease. No pneumothorax. No acute osseous abnormality. IMPRESSION: 1. Shallow lung inflation with the chronic elevation of the right hemidiaphragm and associated bibasilar atelectasis. 2. No other active cardiopulmonary disease. No significant pulmonary vascular congestion or edema identified. Electronically Signed   By: Jeannine Boga M.D.   On: 07/23/2016 20:51    Procedures Procedures (including critical care time)  Medications Ordered in ED Medications - No data to display   Initial Impression / Assessment and Plan / ED Course  I have reviewed the triage vital signs and the nursing notes.  Pertinent labs & imaging results that were available during my care of the patient were reviewed by me and considered in my medical decision making (see chart for details).  Clinical Course    Patient's had some diuresis in the emergency department. Abdominal exam is nontender. Low suspicion for SBP. Do not believe that emergent paracentesis  is needed at this time. Will refill Lasix and spironolactone. Patient's encouraged to call his gastroenterologist to arrange close follow-up. He understands the need to return immediately for any worsening of the swelling, difficulty breathing, fever or for any concerns.   Final Clinical Impressions(s) / ED Diagnoses   Final diagnoses:  Peripheral edema  Other ascites    New Prescriptions Current Discharge Medication List       Julianne Rice, MD 07/23/16 2248

## 2016-07-24 MED ORDER — SPIRONOLACTONE 100 MG PO TABS: 100.0000 mg | ORAL_TABLET | Freq: Once | ORAL | 5 refills | Status: DC

## 2016-07-24 MED ORDER — FUROSEMIDE 40 MG PO TABS: 40.0000 mg | ORAL_TABLET | Freq: Every day | ORAL | 5 refills | Status: DC

## 2016-07-24 NOTE — Telephone Encounter (Signed)
Pt has an appointment on Monday at 11:30. He will go have the blood work done Friday

## 2016-07-24 NOTE — Telephone Encounter (Signed)
Can we move his appointment up from 08/20/16 to sometimes in the next 10 days? nonurgent preferably.   Patient needs to have met 7 prior to that visit.   He was given one month of aldactone and lasix in ER yesterday and we will continue diuretics through our office. Please make patient aware. I am sending in further refills to have once ER RX completed but only for one aldactone and one lasix daily.

## 2016-07-24 NOTE — Telephone Encounter (Signed)
Appointment with Thomas Pratt

## 2016-07-24 NOTE — Addendum Note (Signed)
Addended by: Mahala Menghini on: 07/24/2016 09:41 AM   Modules accepted: Orders

## 2016-07-25 ENCOUNTER — Other Ambulatory Visit: Payer: Self-pay

## 2016-07-25 DIAGNOSIS — R188 Other ascites: Principal | ICD-10-CM

## 2016-07-25 DIAGNOSIS — K7460 Unspecified cirrhosis of liver: Secondary | ICD-10-CM

## 2016-07-25 NOTE — Telephone Encounter (Signed)
Lab orders done. 

## 2016-07-25 NOTE — Addendum Note (Signed)
Addended by: Claudina Lick on: 07/25/2016 10:40 AM   Modules accepted: Orders

## 2016-07-29 ENCOUNTER — Ambulatory Visit: Payer: Self-pay | Admitting: Gastroenterology

## 2016-08-20 ENCOUNTER — Telehealth: Payer: Self-pay | Admitting: Gastroenterology

## 2016-08-20 ENCOUNTER — Ambulatory Visit: Payer: 59 | Admitting: Gastroenterology

## 2016-08-20 ENCOUNTER — Encounter: Payer: Self-pay | Admitting: Gastroenterology

## 2016-08-20 NOTE — Telephone Encounter (Signed)
PT WAS A NO SHOW AND LETTER SENT  °

## 2016-09-10 ENCOUNTER — Encounter (HOSPITAL_COMMUNITY): Payer: Self-pay

## 2016-09-10 DIAGNOSIS — K7031 Alcoholic cirrhosis of liver with ascites: Secondary | ICD-10-CM | POA: Insufficient documentation

## 2016-09-10 DIAGNOSIS — I10 Essential (primary) hypertension: Secondary | ICD-10-CM | POA: Insufficient documentation

## 2016-09-10 NOTE — ED Triage Notes (Signed)
Patient states that he his having abdominal pain, bloating, pressure in abdomen.  Patient also complaining of a knot in inguinal area on right side that pulls when he walks.

## 2016-09-11 ENCOUNTER — Emergency Department (HOSPITAL_COMMUNITY)
Admission: EM | Admit: 2016-09-11 | Discharge: 2016-09-11 | Disposition: A | Discharge status: Home / Self Care | Payer: 59 | Attending: Emergency Medicine | Admitting: Emergency Medicine

## 2016-09-11 DIAGNOSIS — K7031 Alcoholic cirrhosis of liver with ascites: Secondary | ICD-10-CM

## 2016-09-11 LAB — CBC WITH DIFFERENTIAL/PLATELET
BASOS ABS: 0 10*3/uL (ref 0.0–0.1)
Basophils Relative: 0 %
EOS PCT: 1 %
Eosinophils Absolute: 0.1 10*3/uL (ref 0.0–0.7)
HCT: 37 % — ABNORMAL LOW (ref 39.0–52.0)
HEMOGLOBIN: 12.3 g/dL — AB (ref 13.0–17.0)
LYMPHS PCT: 47 %
Lymphs Abs: 3.1 10*3/uL (ref 0.7–4.0)
MCH: 31.4 pg (ref 26.0–34.0)
MCHC: 33.2 g/dL (ref 30.0–36.0)
MCV: 94.4 fL (ref 78.0–100.0)
Monocytes Absolute: 0.4 10*3/uL (ref 0.1–1.0)
Monocytes Relative: 6 %
NEUTROS ABS: 3 10*3/uL (ref 1.7–7.7)
NEUTROS PCT: 46 %
PLATELETS: 249 10*3/uL (ref 150–400)
RBC: 3.92 MIL/uL — AB (ref 4.22–5.81)
RDW: 15.5 % (ref 11.5–15.5)
WBC: 6.6 10*3/uL (ref 4.0–10.5)

## 2016-09-11 LAB — BASIC METABOLIC PANEL
ANION GAP: 7 (ref 5–15)
BUN: 13 mg/dL (ref 6–20)
CO2: 25 mmol/L (ref 22–32)
Calcium: 9.6 mg/dL (ref 8.9–10.3)
Chloride: 103 mmol/L (ref 101–111)
Creatinine, Ser: 0.64 mg/dL (ref 0.61–1.24)
GFR calc Af Amer: >60 mL/min (ref >60)
GLUCOSE: 112 mg/dL — AB (ref 65–99)
POTASSIUM: 4.1 mmol/L (ref 3.5–5.1)
SODIUM: 135 mmol/L (ref 135–145)

## 2016-09-11 LAB — URINALYSIS, ROUTINE W REFLEX MICROSCOPIC
Bilirubin Urine: NEGATIVE
Glucose, UA: NEGATIVE mg/dL
Hgb urine dipstick: NEGATIVE
Ketones, ur: NEGATIVE mg/dL
LEUKOCYTES UA: NEGATIVE
Nitrite: NEGATIVE
PROTEIN: NEGATIVE mg/dL
Specific Gravity, Urine: 1.021 (ref 1.005–1.030)
pH: 5 (ref 5.0–8.0)

## 2016-09-11 LAB — AMMONIA: AMMONIA: 11 umol/L (ref 9–35)

## 2016-09-11 LAB — PROTIME-INR
INR: 1.11
Prothrombin Time: 14.3 seconds (ref 11.4–15.2)

## 2016-09-11 LAB — HEPATIC FUNCTION PANEL
ALT: 19 U/L (ref 17–63)
AST: 47 U/L — AB (ref 15–41)
Albumin: 2.9 g/dL — ABNORMAL LOW (ref 3.5–5.0)
Alkaline Phosphatase: 86 U/L (ref 38–126)
Bilirubin, Direct: 0.2 mg/dL (ref 0.1–0.5)
Indirect Bilirubin: 0.8 mg/dL (ref 0.3–0.9)
TOTAL PROTEIN: 8.7 g/dL — AB (ref 6.5–8.1)
Total Bilirubin: 1 mg/dL (ref 0.3–1.2)

## 2016-09-11 LAB — ETHANOL: Alcohol, Ethyl (B): <5 mg/dL (ref <5)

## 2016-09-11 LAB — I-STAT CG4 LACTIC ACID, ED: Lactic Acid, Venous: 1.21 mmol/L (ref 0.5–1.9)

## 2016-09-11 MED ORDER — SPIRONOLACTONE 100 MG PO TABS: 100.0000 mg | ORAL_TABLET | Freq: Every day | ORAL | 0 refills | Status: DC

## 2016-09-11 MED ORDER — FUROSEMIDE 40 MG PO TABS: 40.0000 mg | ORAL_TABLET | Freq: Every day | ORAL | 0 refills | Status: DC

## 2016-09-11 NOTE — ED Provider Notes (Signed)
[facility] DEPT Provider Note   CSN: [id number] Arrival date & time: 09/10/16  2135   By signing my name below, I, Hilbert Odor, attest that this documentation has been prepared under the direction and in the presence of Orpah Greek, MD. Electronically Signed: Hilbert Odor, Scribe. 09/11/16. 1:04 AM. History   Chief Complaint Chief Complaint  Patient presents with  . Ascites    The history is provided by the patient. No language interpreter was used.    HPI Comments: Thomas Pratt is a 56 y.o. male who presents to the Emergency Department complaining of abdominal pain for the past 2 days. He reports feeling bloated. He states that he had fluid removed in October of 2017. He also reports a knott in his inguinal area on his right side that pulls when he walks. He denies fever, vomiting, and nausea. He reports no alleviating factors.  Past Medical History:  Diagnosis Date  . Cirrhosis ([place])    likely ETOH use   . Hypertension     Patient Active Problem List   Diagnosis Date Noted  . Malnutrition of moderate degree 05/22/2016  . Lower extremity edema   . Liver cirrhosis ([place]) 05/21/2016  . Hypoalbuminemia 05/21/2016  . Hyponatremia 05/21/2016  . Hypokalemia 05/21/2016  . HTN (hypertension) 05/21/2016  . Ascites 05/21/2016  . ALC (alcoholic liver cirrhosis) ([place]) 05/21/2016    Past Surgical History:  Procedure Laterality Date  . NO PAST SURGERIES         Home Medications    Prior to Admission medications   Medication Sig Start Date End Date Taking? Authorizing Provider  furosemide (LASIX) 40 MG tablet Take 1 tablet (40 mg total) by mouth daily. 07/24/16   Mahala Menghini, PA-C  spironolactone (ALDACTONE) 100 MG tablet Take 1 tablet (100 mg total) by mouth once. 07/24/16 07/24/16  Mahala Menghini, PA-C    Family History Family History  Problem Relation Age of Onset  . Liver disease Neg Hx   . Colon cancer Neg Hx   . Colon polyps Neg Hx      Social History Social History  Substance Use Topics  . Smoking status: Never Smoker  . Smokeless tobacco: Never Used  . Alcohol use No     Comment: Quit September 2017, heavy alcohol abuse previously     Allergies   Patient has no known allergies.   Review of Systems Review of Systems  Constitutional: Negative for fever.  Gastrointestinal: Positive for abdominal distention and abdominal pain. Negative for nausea and vomiting.  All other systems reviewed and are negative.    Physical Exam Updated Vital Signs BP 131/86   Pulse 88   Temp 98.7 F (37.1 C) (Oral)   Resp 18   Ht 5\' 8"  (1.727 m)   Wt 137 lb (62.1 kg)   SpO2 96%   BMI 20.83 kg/m   Physical Exam  Constitutional: He is oriented to person, place, and time. He appears well-developed and well-nourished. No distress.  HENT:  Head: Normocephalic and atraumatic.  Right Ear: Hearing normal.  Left Ear: Hearing normal.  Nose: Nose normal.  Mouth/Throat: Oropharynx is clear and moist and mucous membranes are normal.  Eyes: Conjunctivae and EOM are normal. Pupils are equal, round, and reactive to light.  Neck: Normal range of motion. Neck supple.  Cardiovascular: Regular rhythm, S1 normal and S2 normal.  Exam reveals no gallop and no friction rub.   No murmur heard. Pulmonary/Chest: Effort normal and breath sounds normal. No  respiratory distress. He exhibits no tenderness.  Abdominal: Soft. Normal appearance and bowel sounds are normal. He exhibits distension. There is no hepatosplenomegaly. There is tenderness. There is no rebound, no guarding, no tenderness at McBurney's point and negative Murphy's sign. A hernia is present.  Distended. Mildly tender. No peritonitis. Small right inguinal hernia. No incarcerations.   Musculoskeletal: Normal range of motion.  Neurological: He is alert and oriented to person, place, and time. He has normal strength. No cranial nerve deficit or sensory deficit. Coordination normal.  GCS eye subscore is 4. GCS verbal subscore is 5. GCS motor subscore is 6.  Skin: Skin is warm, dry and intact. No rash noted. No cyanosis.  Psychiatric: He has a normal mood and affect. His speech is normal and behavior is normal. Thought content normal.  Nursing note and vitals reviewed.    ED Treatments / Results  DIAGNOSTIC STUDIES: Oxygen Saturation is 97% on RA, normal by my interpretation.    COORDINATION OF CARE: 12:45 AM Discussed treatment plan with pt at bedside, which includes labs, and pt agreed to plan.  Labs (all labs ordered are listed, but only abnormal results are displayed) Labs Reviewed  CBC WITH DIFFERENTIAL/PLATELET - Abnormal; Notable for the following:       Result Value   RBC 3.92 (*)    Hemoglobin 12.3 (*)    HCT 37.0 (*)    All other components within normal limits  BASIC METABOLIC PANEL - Abnormal; Notable for the following:    Glucose, Bld 112 (*)    All other components within normal limits  HEPATIC FUNCTION PANEL - Abnormal; Notable for the following:    Total Protein 8.7 (*)    Albumin 2.9 (*)    AST 47 (*)    All other components within normal limits  PROTIME-INR  URINALYSIS, ROUTINE W REFLEX MICROSCOPIC  AMMONIA  ETHANOL  I-STAT CG4 LACTIC ACID, ED    EKG  EKG Interpretation None       Radiology No results found.  Procedures .Paracentesis Date/Time: 09/11/2016 4:21 AM Performed by: Orpah Greek Authorized by: Orpah Greek   Consent:    Consent obtained:  Written   Consent given by:  Patient   Risks discussed:  Bleeding, bowel perforation and infection Universal protocol:    Procedure explained and questions answered to patient or proxy's satisfaction: yes     Relevant documents present and verified: yes     Test results available and properly labeled: yes     Imaging studies available: yes     Required blood products, implants, devices and special equipment available: yes     Site/side marked: yes      Immediately prior to procedure a time out was called: yes     Patient identity confirmed:  Verbally with patient Pre-procedure details:    Procedure purpose:  Therapeutic   Preparation: Patient was prepped and draped in usual sterile fashion   Anesthesia (see MAR for exact dosages):    Anesthesia method:  Local infiltration   Local anesthetic:  Lidocaine 1% w/o epi Procedure details:    Needle gauge:  18   Ultrasound guidance: yes     Puncture site:  R lower quadrant   Fluid removed amount:  4067mL   Fluid appearance:  Amber   Dressing:  Adhesive bandage Post-procedure details:    Patient tolerance of procedure:  Tolerated well, no immediate complications   EMERGENCY DEPARTMENT [place] ACITES EXAM "Study: Limited Abdominal Ultrasound for Evaluation of  Free Fluid"  INDICATIONS: Abd pain and Distention  PERFORMED BY: Myself IMAGES ARCHIVED?: Yes VIEWS USES: Right lower quad INTERPRETATION: Free fluid present    Medications Ordered in ED Medications - No data to display   Initial Impression / Assessment and Plan / ED Course  I have reviewed the triage vital signs and the nursing notes.  Pertinent labs & imaging results that were available during my care of the patient were reviewed by me and considered in my medical decision making (see chart for details).     Patient presented for evaluation of increasing abdominal girth with abdominal discomfort. Patient reports a history of cirrhosis with recurrent ascites. He reports that he has had paracentesis on Tuesdays other occasions, last time was approximately 4 months ago. He is not experiencing any fever. He has tense abdominal exam with diffuse tenderness but no signs of peritonitis. Lab work is unremarkable.  Patient underwent therapeutic paracentesis at the bedside under ultrasound guidance. She tolerated well and there were no complications. Repeat examination after procedure revealed a nontender abdomen, no signs of peritonitis.  Patient stable for discharge.  Final Clinical Impressions(s) / ED Diagnoses   Final diagnoses:  Cirrhosis of liver with ascites, unspecified hepatic cirrhosis type [facility])    New Prescriptions New Prescriptions   No medications on file   I personally performed the services described in this documentation, which was scribed in my presence. The recorded information has been reviewed and is accurate.    Orpah Greek, MD 09/11/16 [phone]

## 2016-09-16 LAB — CULTURE, BODY FLUID W GRAM STAIN -BOTTLE: Culture: NO GROWTH

## 2016-09-16 LAB — CULTURE, BODY FLUID-BOTTLE

## 2016-09-28 ENCOUNTER — Emergency Department (HOSPITAL_COMMUNITY): Payer: 59

## 2016-09-28 ENCOUNTER — Encounter (HOSPITAL_COMMUNITY): Payer: Self-pay | Admitting: Emergency Medicine

## 2016-09-28 ENCOUNTER — Emergency Department (HOSPITAL_COMMUNITY)
Admission: EM | Admit: 2016-09-28 | Discharge: 2016-09-28 | Disposition: A | Discharge status: Home / Self Care | Payer: 59 | Attending: Emergency Medicine | Admitting: Emergency Medicine

## 2016-09-28 DIAGNOSIS — I10 Essential (primary) hypertension: Secondary | ICD-10-CM | POA: Insufficient documentation

## 2016-09-28 DIAGNOSIS — Z9114 Patient's other noncompliance with medication regimen: Secondary | ICD-10-CM | POA: Insufficient documentation

## 2016-09-28 DIAGNOSIS — R188 Other ascites: Secondary | ICD-10-CM | POA: Diagnosis present

## 2016-09-28 DIAGNOSIS — Z9119 Patient's noncompliance with other medical treatment and regimen: Secondary | ICD-10-CM

## 2016-09-28 DIAGNOSIS — E8809 Other disorders of plasma-protein metabolism, not elsewhere classified: Secondary | ICD-10-CM

## 2016-09-28 DIAGNOSIS — K7031 Alcoholic cirrhosis of liver with ascites: Secondary | ICD-10-CM | POA: Insufficient documentation

## 2016-09-28 DIAGNOSIS — Z91199 Patient's noncompliance with other medical treatment and regimen due to unspecified reason: Secondary | ICD-10-CM

## 2016-09-28 DIAGNOSIS — Z79899 Other long term (current) drug therapy: Secondary | ICD-10-CM | POA: Insufficient documentation

## 2016-09-28 DIAGNOSIS — R600 Localized edema: Secondary | ICD-10-CM | POA: Diagnosis present

## 2016-09-28 DIAGNOSIS — E440 Moderate protein-calorie malnutrition: Secondary | ICD-10-CM | POA: Diagnosis present

## 2016-09-28 DIAGNOSIS — K7030 Alcoholic cirrhosis of liver without ascites: Secondary | ICD-10-CM | POA: Diagnosis present

## 2016-09-28 LAB — COMPREHENSIVE METABOLIC PANEL
ALK PHOS: 108 U/L (ref 38–126)
ALT: 14 U/L — AB (ref 17–63)
AST: 43 U/L — ABNORMAL HIGH (ref 15–41)
Albumin: 2.7 g/dL — ABNORMAL LOW (ref 3.5–5.0)
Anion gap: 7 (ref 5–15)
BILIRUBIN TOTAL: 0.7 mg/dL (ref 0.3–1.2)
BUN: 10 mg/dL (ref 6–20)
CALCIUM: 9.2 mg/dL (ref 8.9–10.3)
CHLORIDE: 102 mmol/L (ref 101–111)
CO2: 27 mmol/L (ref 22–32)
CREATININE: 0.62 mg/dL (ref 0.61–1.24)
GFR calc Af Amer: >60 mL/min (ref >60)
Glucose, Bld: 109 mg/dL — ABNORMAL HIGH (ref 65–99)
Potassium: 3.6 mmol/L (ref 3.5–5.1)
Sodium: 136 mmol/L (ref 135–145)
Total Protein: 8.7 g/dL — ABNORMAL HIGH (ref 6.5–8.1)

## 2016-09-28 LAB — CBC
HCT: 37.3 % — ABNORMAL LOW (ref 39.0–52.0)
Hemoglobin: 12.4 g/dL — ABNORMAL LOW (ref 13.0–17.0)
MCH: 31.6 pg (ref 26.0–34.0)
MCHC: 33.2 g/dL (ref 30.0–36.0)
MCV: 94.9 fL (ref 78.0–100.0)
PLATELETS: 338 10*3/uL (ref 150–400)
RBC: 3.93 MIL/uL — ABNORMAL LOW (ref 4.22–5.81)
RDW: 16 % — AB (ref 11.5–15.5)
WBC: 8.5 10*3/uL (ref 4.0–10.5)

## 2016-09-28 LAB — URINALYSIS, ROUTINE W REFLEX MICROSCOPIC
Bilirubin Urine: NEGATIVE
GLUCOSE, UA: NEGATIVE mg/dL
HGB URINE DIPSTICK: NEGATIVE
KETONES UR: NEGATIVE mg/dL
LEUKOCYTES UA: NEGATIVE
Nitrite: NEGATIVE
PROTEIN: NEGATIVE mg/dL
Specific Gravity, Urine: 1.023 (ref 1.005–1.030)
pH: 5 (ref 5.0–8.0)

## 2016-09-28 LAB — PROTIME-INR
INR: 1.12
Prothrombin Time: 14.5 seconds (ref 11.4–15.2)

## 2016-09-28 LAB — ETHANOL: Alcohol, Ethyl (B): <5 mg/dL (ref <5)

## 2016-09-28 LAB — AMMONIA: AMMONIA: 31 umol/L (ref 9–35)

## 2016-09-28 LAB — LIPASE, BLOOD: Lipase: 26 U/L (ref 11–51)

## 2016-09-28 LAB — TROPONIN I: Troponin I: <0.03 ng/mL (ref <0.03)

## 2016-09-28 NOTE — Discharge Instructions (Signed)
Take your prescriptions as directed.  Call your regular GI doctor on Monday morning to schedule a follow up appointment for outpatient paracentesis on Monday and office follow up next week.  Return to the Emergency Department immediately sooner if worsening.

## 2016-09-28 NOTE — ED Triage Notes (Signed)
Pt reports increased swelling in abdomen, near syncope last night, and generalized aches and fatigue.

## 2016-09-28 NOTE — Consult Note (Signed)
Medical Consultation   Thomas Pratt  [id number]  DOB: [birth year]  DOA: 09/28/2016  PCP: No PCP Per Patient Consultants:  GI - [facility] Patient coming from: home - lives alone; NOK: daughter, Danae Chen [phone]  Chief Complaint: abdominal distention  HPI: Thomas Pratt is a 56 y.o. male with medical history significant of HTN and alcoholic cirrhosis with ascites is presenting with marked abdominal distention.  Last night about 130 he "kind of fainted".  Laid down and ?passed out on the pillow.  Started having difficulty with his abdominal distention on Monday.  NO SOB at rest or with exertion, just when he is trying to get himself out of bed.  +ascites, intermittent paracentesis.  Last drainage was on 2/21.  Takes Lasix/sprinololactone, missed 2 days earlier this week and did not take it today.  Has an inguinal hernia that bothers him sometimes.   ED Course: Work up reassuring  Ambulatory Status:  ambulates without assistance  Review of Systems:  ROS As per HPI; otherwise 10 point review of systems reviewed and negative   Past Medical History: Past Medical History:  Diagnosis Date  . Cirrhosis ([place])    likely ETOH use   . Hypertension     Past Surgical History: Past Surgical History:  Procedure Laterality Date  . NO PAST SURGERIES       Allergies:  No Known Allergies   Social History:  reports that he has never smoked. He has never used smokeless tobacco. He reports that he does not drink alcohol or use drugs.   Family History: Family History  Problem Relation Age of Onset  . Liver disease Neg Hx   . Colon cancer Neg Hx   . Colon polyps Neg Hx     Physical Exam: Vitals:   09/28/16 1747 09/28/16 1748 09/28/16 1749 09/28/16 1839  BP:    135/84  Pulse: 94 95 93 97  Resp:   22 22  Temp:    98.7 F (37.1 C)  TempSrc:    Oral  SpO2: 98% 99% 96% 97%  Weight:      Height:        Constitutional: Alert and awake, oriented x3,  not in any acute distress.  Marked abdominal distention. Eyes: PERLA, EOMI, irises appear normal, anicteric sclera,  ENMT: external ears and nose appear normal, Lips appears normal, oropharynx mucosa, tongue, posterior pharynx appear normal  Neck: neck appears normal, no masses, normal ROM, no thyromegaly, no JVD  CVS: S1-S2 clear, no murmur rubs or gallops, no LE edema, normal pedal pulses  Respiratory:  clear to auscultation bilaterally, no wheezing, rales or rhonchi. Respiratory effort normal. No accessory muscle use.  Abdomen: soft nontender,  normal bowel sounds, marked distention with ascites Musculoskeletal: : no cyanosis, clubbing noted bilaterally; 1-2+ LE edema Neuro: Cranial nerves II-XII intact, strength, sensation, reflexes Psych: judgement and insight appear normal, stable mood and affect, mental status Skin: no rashes or lesions or ulcers, no induration or nodules   Data reviewed:  I have personally reviewed following labs and imaging studies Labs:  CBC:  Recent Labs Lab 09/28/16 1619  WBC 8.5  HGB 12.4*  HCT 37.3*  MCV 94.9  PLT 784    Basic Metabolic Panel:  Recent Labs Lab 09/28/16 1619  NA 136  K 3.6  CL 102  CO2 27  GLUCOSE 109*  BUN 10  CREATININE 0.62  CALCIUM 9.2   GFR Estimated  Creatinine Clearance: 90.6 mL/min (by C-G formula based on SCr of 0.62 mg/dL). Liver Function Tests:  Recent Labs Lab 09/28/16 1619  AST 43*  ALT 14*  ALKPHOS 108  BILITOT 0.7  PROT 8.7*  ALBUMIN 2.7*    Recent Labs Lab 09/28/16 1619  LIPASE 26    Recent Labs Lab 09/28/16 1619  AMMONIA 31   Coagulation profile  Recent Labs Lab 09/28/16 1619  INR 1.12    Cardiac Enzymes:  Recent Labs Lab 09/28/16 1619  TROPONINI <0.03   BNP: Invalid input(s): POCBNP CBG: No results for input(s): GLUCAP in the last 168 hours. D-Dimer No results for input(s): DDIMER in the last 72 hours. Hgb A1c No results for input(s): HGBA1C in the last 72  hours. Lipid Profile No results for input(s): CHOL, HDL, LDLCALC, TRIG, CHOLHDL, LDLDIRECT in the last 72 hours. Thyroid function studies No results for input(s): TSH, T4TOTAL, T3FREE, THYROIDAB in the last 72 hours.  Invalid input(s): FREET3 Anemia work up No results for input(s): VITAMINB12, FOLATE, FERRITIN, TIBC, IRON, RETICCTPCT in the last 72 hours. Urinalysis    Component Value Date/Time   COLORURINE YELLOW 09/28/2016 1619   APPEARANCEUR CLEAR 09/28/2016 1619   LABSPEC 1.023 09/28/2016 1619   PHURINE 5.0 09/28/2016 1619   GLUCOSEU NEGATIVE 09/28/2016 1619   HGBUR NEGATIVE 09/28/2016 1619   BILIRUBINUR NEGATIVE 09/28/2016 [place] 09/28/2016 1619   PROTEINUR NEGATIVE 09/28/2016 1619   NITRITE NEGATIVE 09/28/2016 1619   [place] 09/28/2016 1619     Microbiology No results found for this or any previous visit (from the past 240 hour(s)).   Radiological Exams on Admission: Dg Abd Acute W/chest  Result Date: 09/28/2016 CLINICAL DATA:  Abdominal distention for 3 days. EXAM: DG ABDOMEN ACUTE W/ 1V CHEST COMPARISON:  Chest x-ray 07/23/2016 FINDINGS: Low volume film with stable asymmetric elevation right hemidiaphragm. Right base atelectasis or scarring is unchanged. Cardiopericardial silhouette is upper normal. No evidence for airspace pulmonary edema or focal lung consolidation. The visualized bony structures of the thorax are intact. Upright film shows no evidence for intraperitoneal free air. Supine abdomen shows no gaseous bowel dilatation to suggest obstruction. Centralization of small bowel on the supine film with diffuse haziness overlying the abdomen on the upright exam raises the question of ascites. IMPRESSION: 1. No acute cardiopulmonary findings. 2. No evidence for bowel perforation or obstruction. 3. Probable ascites. Electronically Signed   By: Misty Stanley M.D.   On: 09/28/2016 17:22    Impression/Recommendations Principal Problem:    Ascites Active Problems:   Hypoalbuminemia   ALC (alcoholic liver cirrhosis) ([facility])   Lower extremity edema   Malnutrition of moderate degree  Pertinent labs: -Albumin 2.7, stable -AST 43/ALT 14, stable -Hgb 12.4, stable -Normal bilirubin, platelets, and INR  Patient with chronic liver failure with cirrhosis and ascites requiring recurrent paracentesis.  He lacks a PCP and is inconsistent in both his medication dosing and his GI follow-up.  He is currently not dyspneic at rest and is not hypoxic.  I discussed the patient with Dr. Oneida Alar, who agrees that it is reasonable to schedule him Monday for outpatient paracentesis in her office.   Thank you for this consultation.    Time Spent: 80 minutes  Karmen Bongo M.D. [facility] 09/28/2016, 7:07 PM

## 2016-09-28 NOTE — ED Provider Notes (Signed)
[facility] DEPT Provider Note   CSN: [id number] Arrival date & time: 09/28/16  1557     History   Chief Complaint Chief Complaint  Patient presents with  . Abdominal Pain    HPI Thomas Pratt is a 56 y.o. male.   Abdominal Pain    Pt was seen at 1630. Per pt, c/o gradual onset and worsening of persistent abd "swelling" for the past month, worse over the past week. Pt states last night he felt lightheaded but "doesn't think I passed out," and began to have generalized body aches/fatigue. Pt also states he has not been taking his diuretics regularly and frequently skips multiple doses, including today.  Denies fevers, no CP/SOB, no abd pain, no N/V/D, no back pain, no rash, no focal motor weakness, no tingling/numbness in extremities. The symptoms have been associated with no other complaints. The patient has a significant history of similar symptoms previously, recently being evaluated for this complaint and multiple prior evals for same. Pt has not f/u with GI MD.    GI: Neil Crouch Past Medical History:  Diagnosis Date  . Cirrhosis ([place])    likely ETOH use   . Hypertension     Patient Active Problem List   Diagnosis Date Noted  . Malnutrition of moderate degree 05/22/2016  . Lower extremity edema   . Liver cirrhosis ([place]) 05/21/2016  . Hypoalbuminemia 05/21/2016  . Hyponatremia 05/21/2016  . Hypokalemia 05/21/2016  . HTN (hypertension) 05/21/2016  . Ascites 05/21/2016  . ALC (alcoholic liver cirrhosis) ([place]) 05/21/2016    Past Surgical History:  Procedure Laterality Date  . NO PAST SURGERIES         Home Medications    Prior to Admission medications   Medication Sig Start Date End Date Taking? Authorizing Provider  furosemide (LASIX) 40 MG tablet Take 1 tablet (40 mg total) by mouth daily. 09/11/16  Yes Orpah Greek, MD  spironolactone (ALDACTONE) 100 MG tablet Take 1 tablet (100 mg total) by mouth daily. 09/11/16  Yes Orpah Greek,  MD    Family History Family History  Problem Relation Age of Onset  . Liver disease Neg Hx   . Colon cancer Neg Hx   . Colon polyps Neg Hx     Social History Social History  Substance Use Topics  . Smoking status: Never Smoker  . Smokeless tobacco: Never Used  . Alcohol use No     Comment: Quit September 2017, heavy alcohol abuse previously     Allergies   Patient has no known allergies.   Review of Systems Review of Systems  Gastrointestinal: Positive for abdominal pain.   ROS: Statement: All systems negative except as marked or noted in the HPI; Constitutional: Negative for fever and chills. ; ; Eyes: Negative for eye pain, redness and discharge. ; ; ENMT: Negative for ear pain, hoarseness, nasal congestion, sinus pressure and sore throat. ; ; Cardiovascular: Negative for chest pain, palpitations, diaphoresis, dyspnea and peripheral edema. ; ; Respiratory: Negative for cough, wheezing and stridor. ; ; Gastrointestinal: +"abd swelling." Negative for nausea, vomiting, diarrhea, abdominal pain, blood in stool, hematemesis, jaundice and rectal bleeding. . ; ; Genitourinary: Negative for dysuria, flank pain and hematuria. ; ; Musculoskeletal: Negative for back pain and neck pain. Negative for swelling and trauma.; ; Skin: Negative for pruritus, rash, abrasions, blisters, bruising and skin lesion.; ; Neuro: +lightheadedness, generalized weakness/fatigue.  Negative for headache and neck stiffness. Negative for extremity weakness, paresthesias, involuntary movement, seizure.  Physical Exam Updated Vital Signs BP 153/94 (BP Location: Left Arm)   Pulse 101   Temp 99 F (37.2 C) (Oral)   Resp 18   Ht 5\' 8"  (1.727 m)   Wt 137 lb (62.1 kg)   SpO2 100%   BMI 20.83 kg/m   16:48:42 Orthostatic Vital Signs MM  Orthostatic Lying   BP- Lying: 132/89  Pulse- Lying: 94      Orthostatic Sitting  BP- Sitting:  148/96  Pulse- Sitting: 96      Orthostatic Standing at 0 minutes    BP- Standing at 0 minutes:  143/94  Pulse- Standing at 0 minutes: 94    Physical Exam 1635: Physical examination:  Nursing notes reviewed; Vital signs and O2 SAT reviewed;  Constitutional: Well developed, Well nourished, Well hydrated, In no acute distress; Head:  Normocephalic, atraumatic; Eyes: EOMI, PERRL, No scleral icterus; ENMT: Mouth and pharynx normal, Mucous membranes moist; Neck: Supple, Full range of motion, No lymphadenopathy; Cardiovascular: Regular rate and rhythm, No gallop; Respiratory: Breath sounds clear & equal bilaterally, No wheezes.  Speaking full sentences with ease, Normal respiratory effort/excursion; Chest: Nontender, Movement normal; Abdomen:  Nontender, +softly distended, Normal bowel sounds; Genitourinary: No CVA tenderness; Extremities: Pulses normal, No tenderness, +2 pedal edema bilat. No calf asymmetry.; Neuro: AA&Ox3, Major CN grossly intact. No facial droop. Speech clear. No gross focal motor or sensory deficits in extremities.; Skin: Color normal, Warm, Dry.    ED Treatments / Results  Labs (all labs ordered are listed, but only abnormal results are displayed)   EKG  EKG Interpretation  Date/Time:  Saturday September 28 2016 17:51:11 EST Ventricular Rate:  90 PR Interval:    QRS Duration: 94 QT Interval:  373 QTC Calculation: 457 R Axis:   -15 Text Interpretation:  Sinus rhythm Borderline left axis deviation Low voltage, precordial leads Baseline wander When compared with ECG of 07/23/2016 No significant change was found Confirmed by [facility]  MD, Nunzio Cory [phone]) on 09/28/2016 5:58:36 PM       Radiology   Procedures Procedures (including critical care time)  Medications Ordered in ED Medications - No data to display   Initial Impression / Assessment and Plan / ED Course  I have reviewed the triage vital signs and the nursing notes.  Pertinent labs & imaging results that were available during my care of the patient were reviewed by me and  considered in my medical decision making (see chart for details).  MDM Reviewed: previous chart, nursing note and vitals Reviewed previous: labs and ECG Interpretation: labs, ECG and x-ray    Results for orders placed or performed during the hospital encounter of 09/28/16  Lipase, blood  Result Value Ref Range   Lipase 26 11 - 51 U/L  Comprehensive metabolic panel  Result Value Ref Range   Sodium 136 135 - 145 mmol/L   Potassium 3.6 3.5 - 5.1 mmol/L   Chloride 102 101 - 111 mmol/L   CO2 27 22 - 32 mmol/L   Glucose, Bld 109 (H) 65 - 99 mg/dL   BUN 10 6 - 20 mg/dL   Creatinine, Ser 0.62 0.61 - 1.24 mg/dL   Calcium 9.2 8.9 - 10.3 mg/dL   Total Protein 8.7 (H) 6.5 - 8.1 g/dL   Albumin 2.7 (L) 3.5 - 5.0 g/dL   AST 43 (H) 15 - 41 U/L   ALT 14 (L) 17 - 63 U/L   Alkaline Phosphatase 108 38 - 126 U/L   Total Bilirubin 0.7 0.3 -  1.2 mg/dL   GFR calc non Af Amer >60 >60 mL/min   GFR calc Af Amer >60 >60 mL/min   Anion gap 7 5 - 15  CBC  Result Value Ref Range   WBC 8.5 4.0 - 10.5 K/uL   RBC 3.93 (L) 4.22 - 5.81 MIL/uL   Hemoglobin 12.4 (L) 13.0 - 17.0 g/dL   HCT 37.3 (L) 39.0 - 52.0 %   MCV 94.9 78.0 - 100.0 fL   MCH 31.6 26.0 - 34.0 pg   MCHC 33.2 30.0 - 36.0 g/dL   RDW 16.0 (H) 11.5 - 15.5 %   Platelets 338 150 - 400 K/uL  Urinalysis, Routine w reflex microscopic  Result Value Ref Range   Color, Urine YELLOW YELLOW   APPearance CLEAR CLEAR   Specific Gravity, Urine 1.023 1.005 - 1.030   pH 5.0 5.0 - 8.0   Glucose, UA NEGATIVE NEGATIVE mg/dL   Hgb urine dipstick NEGATIVE NEGATIVE   Bilirubin Urine NEGATIVE NEGATIVE   Ketones, ur NEGATIVE NEGATIVE mg/dL   Protein, ur NEGATIVE NEGATIVE mg/dL   Nitrite NEGATIVE NEGATIVE   Leukocytes, UA NEGATIVE NEGATIVE  Protime-INR  Result Value Ref Range   Prothrombin Time 14.5 11.4 - 15.2 seconds   INR 1.12   Ammonia  Result Value Ref Range   Ammonia 31 9 - 35 umol/L  Ethanol  Result Value Ref Range   Alcohol, Ethyl (B) <5 <5  mg/dL  Troponin I  Result Value Ref Range   Troponin I <0.03 <0.03 ng/mL   Dg Abd Acute W/chest Result Date: 09/28/2016 CLINICAL DATA:  Abdominal distention for 3 days. EXAM: DG ABDOMEN ACUTE W/ 1V CHEST COMPARISON:  Chest x-ray 07/23/2016 FINDINGS: Low volume film with stable asymmetric elevation right hemidiaphragm. Right base atelectasis or scarring is unchanged. Cardiopericardial silhouette is upper normal. No evidence for airspace pulmonary edema or focal lung consolidation. The visualized bony structures of the thorax are intact. Upright film shows no evidence for intraperitoneal free air. Supine abdomen shows no gaseous bowel dilatation to suggest obstruction. Centralization of small bowel on the supine film with diffuse haziness overlying the abdomen on the upright exam raises the question of ascites. IMPRESSION: 1. No acute cardiopulmonary findings. 2. No evidence for bowel perforation or obstruction. 3. Probable ascites. Electronically Signed   By: Misty Stanley M.D.   On: 09/28/2016 17:22    8099:  Pt not orthostatic on VS; workup reassuring. Pt remains afebrile, abd soft/NT. Pt has not been taking diuretics regularly, nor f/u with GI MD. High concern that pt will not f/u as outpt for paracentesis in the next 2 days. T/C to [facility]. Lorin Mercy, case discussed, including:  HPI, pertinent PM/SHx, VS/PE, dx testing, ED course and treatment:  Agreeable to come to ED for evaluation.     1822:  [facility] Dr. Lorin Mercy has come to the ED for evaluation:  Shares concern, she called GI Dr. Oneida Alar (who knows pt well), states pt can f/u Monday for outpatient paracentesis (see consult note). Pt states he has "enough rx" of diuretics (EPIC chart review show pt received both diuretic rx with 5 refills 2 months ago). Dx and testing d/w pt.  Questions answered.  Verb understanding, agreeable to d/c home with outpt f/u on Monday with GI MD.    Final Clinical Impressions(s) / ED Diagnoses   Final diagnoses:  None     New Prescriptions New Prescriptions   No medications on file      Francine Graven, DO 10/02/16  1538  

## 2016-09-28 NOTE — ED Notes (Signed)
Pt made aware to return if symptoms worsen or if any life threatening symptoms occur.   

## 2016-09-28 NOTE — ED Notes (Signed)
ekg given to Dr. McManus  

## 2016-09-29 ENCOUNTER — Telehealth: Payer: Self-pay | Admitting: Gastroenterology

## 2016-09-29 NOTE — Telephone Encounter (Signed)
Pt SEEN IN ED NEEDS LARGE VOLUME PARACENTESIS-NO LABS MON MAR 12 IF POSSIBLE. GET LL TO WRITE FOR ALBUMIN TO FAX TO RADIOLOGY. NEEDS OPV W/I NEXT MO W/ RMR OR LL, Dx: ETOHIC CIRRHOSIS/ASCITES

## 2016-09-30 ENCOUNTER — Ambulatory Visit (HOSPITAL_COMMUNITY)
Admission: RE | Admit: 2016-09-30 | Discharge: 2016-09-30 | Disposition: A | Discharge status: Home / Self Care | Payer: Self-pay | Source: Ambulatory Visit | Attending: Internal Medicine | Admitting: Internal Medicine

## 2016-09-30 ENCOUNTER — Other Ambulatory Visit: Payer: Self-pay

## 2016-09-30 DIAGNOSIS — K7460 Unspecified cirrhosis of liver: Secondary | ICD-10-CM | POA: Insufficient documentation

## 2016-09-30 DIAGNOSIS — R188 Other ascites: Secondary | ICD-10-CM

## 2016-09-30 MED ORDER — ALBUMIN HUMAN 25 % IV SOLN
INTRAVENOUS | Status: AC
  Filled 2016-09-30: qty 200

## 2016-09-30 MED ORDER — ALBUMIN HUMAN 25 % IV SOLN
50.0000 g | Freq: Once | INTRAVENOUS | Status: AC
  Administered 2016-09-30: 50 g via INTRAVENOUS

## 2016-09-30 NOTE — Telephone Encounter (Signed)
I have not scheduled since I am unable to get in touch with him.

## 2016-09-30 NOTE — Telephone Encounter (Signed)
Give IV albumin 25%, 50 grams at onset of paracentesis.

## 2016-09-30 NOTE — Telephone Encounter (Signed)
Pt is aware and he will be going on to the hospital now

## 2016-09-30 NOTE — Procedures (Signed)
PreOperative Dx: Cirrhosis, ascites Postoperative Dx: Cirrhosis, ascites Procedure:   US guided paracentesis Radiologist:  Gurtej Noyola Anesthesia:  10 ml of1% lidocaine Specimen:  10.2 L of yellow ascitic fluid EBL:   < 1 ml Complications: None  

## 2016-09-30 NOTE — Progress Notes (Signed)
Paracentesis complete no signs of distress. 10.2 Yellow colored ascites removed.

## 2016-09-30 NOTE — Telephone Encounter (Signed)
I have tried to call with no answer...

## 2016-10-22 ENCOUNTER — Telehealth: Payer: Self-pay | Admitting: Gastroenterology

## 2016-10-22 ENCOUNTER — Ambulatory Visit: Payer: 59 | Admitting: Gastroenterology

## 2016-10-22 NOTE — Telephone Encounter (Signed)
Patient was a no show and letter sent  °

## 2016-10-22 NOTE — Telephone Encounter (Signed)
Tried to call pt- NA 

## 2016-10-22 NOTE — Telephone Encounter (Signed)
Please contact patient. We need for him to keep office visit appointments especially if he wants [place] to continue management of his ascites.

## 2016-10-23 NOTE — Telephone Encounter (Signed)
Tried to call pt- NA 

## 2016-10-29 NOTE — Telephone Encounter (Signed)
Letter mailed to the pt. 

## 2016-11-14 ENCOUNTER — Ambulatory Visit (INDEPENDENT_AMBULATORY_CARE_PROVIDER_SITE_OTHER): Payer: Self-pay | Admitting: Gastroenterology

## 2016-11-14 ENCOUNTER — Encounter: Payer: Self-pay | Admitting: Gastroenterology

## 2016-11-14 VITALS — BP 115/76 | HR 94 | Temp 97.8°F | Ht 68.0 in | Wt 147.6 lb

## 2016-11-14 DIAGNOSIS — K7031 Alcoholic cirrhosis of liver with ascites: Secondary | ICD-10-CM

## 2016-11-14 DIAGNOSIS — K4090 Unilateral inguinal hernia, without obstruction or gangrene, not specified as recurrent: Secondary | ICD-10-CM | POA: Insufficient documentation

## 2016-11-14 NOTE — Assessment & Plan Note (Signed)
Abdominal paracenteses in February and March. Patient had come off diuretic regimen. Reports no alcohol in 8 months. Minimally elevated transaminases back in March. Bilirubin normal. Platelet count normal. He is due for updated labs. Also due for updated imaging of the liver for hepatoma screening. He will continue current diuretic regimen.  We need to verify whether or not he started hepatitis B series.  Once labs are available, consider surgical referral for inguinal hernia repair.  We'll have patient come back in 2 months, at that time consider EGD for esophageal variceal screening as well as colonoscopy.

## 2016-11-14 NOTE — Progress Notes (Signed)
Patient currently uninsured. Can we find out if he started and/or completed hep b vaccines. Advised to have done when I saw him 05/2016.

## 2016-11-14 NOTE — Progress Notes (Signed)
Primary Care Physician: No PCP Per Patient  Primary Gastroenterologist:  Garfield Cornea, MD   Chief Complaint  Patient presents with  . Cirrhosis    has some questions    HPI: Thomas Pratt is a 56 y.o. male here for follow-up cirrhosis. He was last seen in November 2017. We initially met the patient when he was hospitalized in October 2017 for decompensated cirrhosis, alcoholic hepatitis. He previously had been followed by Dr. Britta Mccreedy in [place] prior to that. For markers negative. Needs vaccination for hepatitis B. Immune to hepatitis A. It is unclear whether or not he started hep B vaccines. He diarrhea stopped about 50 pounds during his admission.  He quit drinking September 2017. Clinically he feels well. He did go to the emergency department in March for abdominal swelling. He had an outpatient paracentesis with removal of 10 L of fluid. He also had 4 L removed in the ER in February 2018 by the ER physician. Patient states that he had run out of his diuretic medications. He reports being back on Lasix and Aldactone once daily. No recurrent ascites over the past 6 weeks. No significant lower extremity edema. Denies abdominal pain. Bowel movements regular. No confusion or lethargy. No upper GI symptoms. His main complaint is right inguinal hernia which is enlarging. States it is tender at times. He also began coughing about 3 days ago, no shortness of breath or chest pain. Wonders what he can take for cough over-the-counter. No fever or chills.     Current Outpatient Prescriptions  Medication Sig Dispense Refill  . furosemide (LASIX) 40 MG tablet Take 1 tablet (40 mg total) by mouth daily. 30 tablet 0  . spironolactone (ALDACTONE) 100 MG tablet Take 1 tablet (100 mg total) by mouth daily. 30 tablet 0   No current facility-administered medications for this visit.     Allergies as of 11/14/2016  . (No Known Allergies)    ROS:  General: Negative for anorexia, weight loss,  fever, chills, fatigue, weakness. ENT: Negative for hoarseness, difficulty swallowing , nasal congestion. CV: Negative for chest pain, angina, palpitations, dyspnea on exertion, peripheral edema.  Respiratory: Negative for dyspnea at rest, dyspnea on exertion, cough, sputum, wheezing.  GI: See history of present illness. GU:  Negative for dysuria, hematuria, urinary incontinence, urinary frequency, nocturnal urination.  Endo: Negative for unusual weight change.    Physical Examination:   BP 115/76   Pulse 94   Temp 97.8 F (36.6 C) (Oral)   Ht '5\' 8"'  (1.727 m)   Wt 147 lb 9.6 oz (67 kg)   BMI 22.44 kg/m   General: chronically ill-appearing male in no acute distress. Accompanied by son. Eyes: No icterus. Mouth: Oropharyngeal mucosa moist and pink , no lesions erythema or exudate. Lungs: Clear to auscultation bilaterally.  Heart: Regular rate and rhythm, no murmurs rubs or gallops.  Abdomen: Bowel sounds are normal, nontender, nondistended, no hepatosplenomegaly or masses, no abdominal bruits, no rebound or guarding.  Golf ball sized hernia noted in the right inguinal area, easily reducible. Extremities: No lower extremity edema. No clubbing or deformities. Neuro: Alert and oriented x 4   Skin: Warm and dry, no jaundice.   Psych: Alert and cooperative, normal mood and affect.  Labs:  Lab Results  Component Value Date   WBC 8.5 09/28/2016   HGB 12.4 (L) 09/28/2016   HCT 37.3 (L) 09/28/2016   MCV 94.9 09/28/2016   PLT 338 09/28/2016   Lab Results  Component Value Date   CREATININE 0.62 09/28/2016   BUN 10 09/28/2016   NA 136 09/28/2016   K 3.6 09/28/2016   CL 102 09/28/2016   CO2 27 09/28/2016   Lab Results  Component Value Date   ALT 14 (L) 09/28/2016   AST 43 (H) 09/28/2016   ALKPHOS 108 09/28/2016   BILITOT 0.7 09/28/2016    Imaging Studies: No results found.

## 2016-11-14 NOTE — Patient Instructions (Signed)
1. Please have your labs done.  2. Please have your ultrasound done.  3. Once your results are back, we will talk about referring you to a surgeon for the hernia. 4. Please complete the patient assistance forms provided to your today. 5. For your cough, you can try Guaifenesin per label. You can buy this over the counter.  6. Return to the office in 2 months. You will need to consider letting us run a light and look into your stomach for blood vessels that develop with cirrhosis. These can bleed if not treated. You should also consider a colonoscopy. We can discuss at your next office visit.

## 2016-11-15 ENCOUNTER — Encounter: Payer: Self-pay | Admitting: Gastroenterology

## 2016-11-15 NOTE — Progress Notes (Signed)
NO PCP PER PATIENT °

## 2016-11-15 NOTE — Progress Notes (Signed)
Tried to call pt- NA 

## 2016-11-19 NOTE — Progress Notes (Signed)
Tried to call pt- NA 

## 2016-11-20 ENCOUNTER — Ambulatory Visit (HOSPITAL_COMMUNITY): Admission: RE | Admit: 2016-11-20 | Payer: Self-pay | Source: Ambulatory Visit

## 2016-11-22 ENCOUNTER — Other Ambulatory Visit (HOSPITAL_COMMUNITY)
Admission: RE | Admit: 2016-11-22 | Discharge: 2016-11-22 | Disposition: A | Discharge status: Home / Self Care | Payer: Self-pay | Source: Ambulatory Visit | Attending: Gastroenterology | Admitting: Gastroenterology

## 2016-11-22 ENCOUNTER — Ambulatory Visit (HOSPITAL_COMMUNITY)
Admission: RE | Admit: 2016-11-22 | Discharge: 2016-11-22 | Disposition: A | Discharge status: Home / Self Care | Payer: Self-pay | Source: Ambulatory Visit | Attending: Gastroenterology | Admitting: Gastroenterology

## 2016-11-22 DIAGNOSIS — K7031 Alcoholic cirrhosis of liver with ascites: Secondary | ICD-10-CM | POA: Insufficient documentation

## 2016-11-22 DIAGNOSIS — K4090 Unilateral inguinal hernia, without obstruction or gangrene, not specified as recurrent: Secondary | ICD-10-CM | POA: Insufficient documentation

## 2016-11-22 DIAGNOSIS — R938 Abnormal findings on diagnostic imaging of other specified body structures: Secondary | ICD-10-CM | POA: Insufficient documentation

## 2016-11-22 LAB — COMPREHENSIVE METABOLIC PANEL
ALT: 18 U/L (ref 17–63)
ANION GAP: 7 (ref 5–15)
AST: 38 U/L (ref 15–41)
Albumin: 3.3 g/dL — ABNORMAL LOW (ref 3.5–5.0)
Alkaline Phosphatase: 114 U/L (ref 38–126)
BUN: 13 mg/dL (ref 6–20)
CHLORIDE: 100 mmol/L — AB (ref 101–111)
CO2: 26 mmol/L (ref 22–32)
Calcium: 9.8 mg/dL (ref 8.9–10.3)
Creatinine, Ser: 0.84 mg/dL (ref 0.61–1.24)
Glucose, Bld: 110 mg/dL — ABNORMAL HIGH (ref 65–99)
POTASSIUM: 4.2 mmol/L (ref 3.5–5.1)
SODIUM: 133 mmol/L — AB (ref 135–145)
Total Bilirubin: 1 mg/dL (ref 0.3–1.2)
Total Protein: 8.9 g/dL — ABNORMAL HIGH (ref 6.5–8.1)

## 2016-11-22 LAB — PROTIME-INR
INR: 1.15
PROTHROMBIN TIME: 14.7 s (ref 11.4–15.2)

## 2016-11-23 LAB — AFP TUMOR MARKER: AFP-Tumor Marker: 2.2 ng/mL (ref 0.0–8.3)

## 2016-11-27 NOTE — Progress Notes (Signed)
Letter mailed to the pt. 

## 2016-12-02 NOTE — Progress Notes (Signed)
6.41mm gb focus ?adenomyomatosis vs sludge. ?gb wall thickening related to hypoproteinemia. Patient without symptoms of gb disease.  Moderate ascites on u/s. Labs stable. MELD 8, Childs Pugh Class B.    1#  Continue lasix, aldactone.  2#  Refer to surgery for "opinion regarding possible repair of left inguinal hernia in a cirrhotic".  3#  RUQ u/s in six months. Dx: hepatoma screening and abnormal gb.  4#  CMET, PT/INR, AFP in six months

## 2016-12-02 NOTE — Progress Notes (Signed)
See result note attached to ruq u/s.

## 2016-12-11 NOTE — Progress Notes (Signed)
Can we also include in that surgical referral for hernia the fact that he had gb abnormality on u/s?

## 2016-12-19 ENCOUNTER — Other Ambulatory Visit: Payer: Self-pay

## 2016-12-19 ENCOUNTER — Encounter: Payer: Self-pay | Admitting: General Practice

## 2016-12-19 ENCOUNTER — Other Ambulatory Visit: Payer: Self-pay | Admitting: General Practice

## 2016-12-19 DIAGNOSIS — K4090 Unilateral inguinal hernia, without obstruction or gangrene, not specified as recurrent: Secondary | ICD-10-CM

## 2016-12-19 DIAGNOSIS — K7031 Alcoholic cirrhosis of liver with ascites: Secondary | ICD-10-CM

## 2016-12-19 NOTE — Progress Notes (Signed)
ON RECALL  °

## 2016-12-19 NOTE — Progress Notes (Signed)
We have been unable to reach the patient via telephone we will mail letter out with recommendations to the patient today.  Labs has been placed on the recall list  Tretha Sciara, please make referral to surgery Re:"opinion regarding possible repair of left inguinal hernia in a cirrhotic".   Stacey pleas nic 6 month abd u/s f/u Re:  hepatoma screening and abnormal gb

## 2016-12-20 ENCOUNTER — Other Ambulatory Visit: Payer: Self-pay

## 2016-12-20 MED ORDER — FUROSEMIDE 40 MG PO TABS: 40.0000 mg | ORAL_TABLET | Freq: Every day | ORAL | 5 refills | Status: DC

## 2016-12-20 MED ORDER — SPIRONOLACTONE 100 MG PO TABS: 100.0000 mg | ORAL_TABLET | Freq: Every day | ORAL | 5 refills | Status: DC

## 2017-01-07 ENCOUNTER — Ambulatory Visit (INDEPENDENT_AMBULATORY_CARE_PROVIDER_SITE_OTHER): Payer: Self-pay | Admitting: General Surgery

## 2017-01-07 ENCOUNTER — Encounter: Payer: Self-pay | Admitting: General Surgery

## 2017-01-07 VITALS — BP 131/77 | HR 85 | Temp 98.2°F | Resp 20 | Ht 68.0 in | Wt 152.0 lb

## 2017-01-07 DIAGNOSIS — K4090 Unilateral inguinal hernia, without obstruction or gangrene, not specified as recurrent: Secondary | ICD-10-CM

## 2017-01-07 NOTE — Patient Instructions (Signed)
Inguinal Hernia, Adult An inguinal hernia is when fat or the intestines push through the area where the leg meets the lower belly (groin) and make a rounded lump (bulge). This condition happens over time. There are three types of inguinal hernias. These types include:  Hernias that can be pushed back into the belly (are reducible).  Hernias that cannot be pushed back into the belly (are incarcerated).  Hernias that cannot be pushed back into the belly and lose their blood supply (get strangulated). This type needs emergency surgery.  Follow these instructions at home: Lifestyle  Drink enough fluid to keep your urine (pee) clear or pale yellow.  Eat plenty of fruits, vegetables, and whole grains. These have a lot of fiber. Talk with your doctor if you have questions.  Avoid lifting heavy objects.  Avoid standing for long periods of time.  Do not use tobacco products. These include cigarettes, chewing tobacco, or e-cigarettes. If you need help quitting, ask your doctor.  Try to stay at a healthy weight. General instructions  Do not try to force the hernia back in.  Watch your hernia for any changes in color or size. Let your doctor know if there are any changes.  Take over-the-counter and prescription medicines only as told by your doctor.  Keep all follow-up visits as told by your doctor. This is important. Contact a doctor if:  You have a fever.  You have new symptoms.  Your symptoms get worse. Get help right away if:  The area where the legs meets the lower belly has: ? Pain that gets worse suddenly. ? A bulge that gets bigger suddenly and does not go down. ? A bulge that turns red or purple. ? A bulge that is painful to the touch.  You are a man and your scrotum: ? Suddenly feels painful. ? Suddenly changes in size.  You feel sick to your stomach (nauseous) and this feeling does not go away.  You throw up (vomit) and this keeps happening.  You feel your heart  beating a lot more quickly than normal.  You cannot poop (have a bowel movement) or pass gas. This information is not intended to replace advice given to you by your health care provider. Make sure you discuss any questions you have with your health care provider. Document Released: 08/08/2006 Document Revised: 12/14/2015 Document Reviewed: 05/18/2014 Elsevier Interactive Patient Education  2018 [employer] Inc[employer]  

## 2017-01-07 NOTE — Progress Notes (Signed)
Thomas Pratt; [id number]; May 01, 1961   HPI   Patient is a 56 year old Hispanic male is referred to my care by Thomas Pratt for evaluation and treatment of a right inguinal hernia.  He has alcohol induced cirrhosis requiring monthly paracentesis.  He has stopped drinking and they have been able to decrease the amount of ascites they need to remove.  He does have a right inguinal hernia that reduces on its own. He is undergoing paracentesis later this week. Past Medical History:  Diagnosis Date  . Cirrhosis ([place])    likely ETOH use   . Hypertension     Past Surgical History:  Procedure Laterality Date  . NO PAST SURGERIES      Family History  Problem Relation Age of Onset  . Liver disease Neg Hx   . Colon cancer Neg Hx   . Colon polyps Neg Hx     Current Outpatient Prescriptions on File Prior to Visit  Medication Sig Dispense Refill  . furosemide (LASIX) 40 MG tablet Take 1 tablet (40 mg total) by mouth daily. 30 tablet 5  . spironolactone (ALDACTONE) 100 MG tablet Take 1 tablet (100 mg total) by mouth daily. 30 tablet 5   No current facility-administered medications on file prior to visit.     No Known Allergies  History  Alcohol Use No    Comment: Quit September 2017, heavy alcohol abuse previously    History  Smoking Status  . Never Smoker  Smokeless Tobacco  . Never Used    Review of Systems  Constitutional: Negative.   HENT: Negative.   Eyes: Negative.   Respiratory: Negative.   Cardiovascular: Negative.   Gastrointestinal: Negative.   Genitourinary: Negative.   Musculoskeletal: Positive for back pain, joint pain and neck pain.  Skin: Negative.   Neurological: Negative.   Endo/Heme/Allergies: Negative.   Psychiatric/Behavioral: Negative.     Objective   Vitals:   01/07/17 1523  BP: 131/77  Pulse: 85  Resp: 20  Temp: 98.2 F (36.8 C)    Physical Exam  Constitutional: He is oriented to person, place, and time and well-developed,  well-nourished, and in no distress.  HENT:  Head: Normocephalic and atraumatic.  Neck: Normal range of motion. Neck supple.  Cardiovascular: Normal rate, regular rhythm and normal heart sounds.   No murmur heard. Pulmonary/Chest: Effort normal and breath sounds normal. He has no wheezes. He has no rales.  Abdominal: Soft. Bowel sounds are normal. He exhibits distension. There is no tenderness.  Moderate non-tense ascites present.  An easily reducible right inguinal hernia is present.  Neurological: He is alert and oriented to person, place, and time.  Skin: Skin is warm and dry.  Vitals reviewed.   Assessment    Right inguinal hernia, cirrhosis with ascites Plan    I told the patient that once his ascites is under better control, we can consider right renal herniorrhaphy.  The recurrence rate is higher when he is undergoing paracentesis.  He is at low risk for incarceration.  He will follow-up in the office in several months to see how well his ascites is controlled.  Will reevaluate at that time.

## 2017-01-15 ENCOUNTER — Encounter: Payer: Self-pay | Admitting: Gastroenterology

## 2017-01-15 ENCOUNTER — Telehealth: Payer: Self-pay | Admitting: Gastroenterology

## 2017-01-15 ENCOUNTER — Ambulatory Visit: Payer: Self-pay | Admitting: Gastroenterology

## 2017-01-15 NOTE — Telephone Encounter (Signed)
PATIENT WAS A NO SHOW AND LETTER SENT  °

## 2017-01-28 ENCOUNTER — Encounter: Payer: Self-pay | Admitting: Internal Medicine

## 2017-01-28 ENCOUNTER — Telehealth: Payer: Self-pay | Admitting: Internal Medicine

## 2017-01-28 NOTE — Telephone Encounter (Signed)
Can you schedule urgent ov for this pt?

## 2017-01-28 NOTE — Telephone Encounter (Signed)
The 1st available urgent spot will be 7/20 @11am  with EG. OV made and letter mailed to patient.

## 2017-01-28 NOTE — Telephone Encounter (Signed)
Spoke with the pt, he said he is having a lot of swelling in his abd. Very small amount in his extremities, but manly in his abd. Pt is taking his fluid pills, lasix and aldactone as prescribed. He is breathing ok right now, but the fluid is pushing on the hernia. Seen by Dr.Jenkins and he said he was told that he needed to get fluid under control before they would do hernia repair. Can we schedule a para or does he need to be seen first. Pt no showed last ov.

## 2017-01-28 NOTE — Telephone Encounter (Signed)
Pt called to confirm OV and asked if there was anything any sooner. I told him we did not have anything any sooner and our schedule is into September now. He said that he was swelling and the surgeon said he couldn't do the hernia surgery due to the fluid in swelling. Please advise if we can see him any sooner on an Urgent spot or does he need fluid drawn. [phone]

## 2017-01-28 NOTE — Telephone Encounter (Signed)
Noted patient's concerns and have reviewed OV note from Dr. Arnoldo Morale.  Patient last had abdominal tap in 09/2016, 10 liters removed. Prior to that he had two taps in 05/2016. He had not required regular LVAP.   Dr. Arnoldo Morale is concerned that if he has significant recurrent ascites that he is at risk for recurrent hernia after repair and wants to see him back in several months to see if ascites better controlled.   Can we use an urgent OV for patient? I don't feel comfortable scheduling a LVAP without verifying significant ascites. If there is no urgent ov in the next two weeks, can we bring him in for quick nurve visit to assess ascites and need for LVAP.

## 2017-01-29 NOTE — Telephone Encounter (Signed)
Put him on cancel list for urgent ov opening

## 2017-02-07 ENCOUNTER — Other Ambulatory Visit (HOSPITAL_COMMUNITY)
Admission: RE | Admit: 2017-02-07 | Discharge: 2017-02-07 | Disposition: A | Discharge status: Home / Self Care | Payer: Self-pay | Source: Ambulatory Visit | Attending: Nurse Practitioner | Admitting: Nurse Practitioner

## 2017-02-07 ENCOUNTER — Encounter: Payer: Self-pay | Admitting: Nurse Practitioner

## 2017-02-07 ENCOUNTER — Ambulatory Visit (INDEPENDENT_AMBULATORY_CARE_PROVIDER_SITE_OTHER): Payer: Self-pay | Admitting: Nurse Practitioner

## 2017-02-07 ENCOUNTER — Ambulatory Visit (HOSPITAL_COMMUNITY)
Admission: RE | Admit: 2017-02-07 | Discharge: 2017-02-07 | Disposition: A | Discharge status: Home / Self Care | Payer: Self-pay | Source: Ambulatory Visit | Attending: Nurse Practitioner | Admitting: Nurse Practitioner

## 2017-02-07 VITALS — BP 130/73 | HR 63 | Temp 96.9°F | Ht 68.0 in | Wt 153.2 lb

## 2017-02-07 DIAGNOSIS — R600 Localized edema: Secondary | ICD-10-CM | POA: Insufficient documentation

## 2017-02-07 DIAGNOSIS — K7031 Alcoholic cirrhosis of liver with ascites: Secondary | ICD-10-CM

## 2017-02-07 DIAGNOSIS — K4090 Unilateral inguinal hernia, without obstruction or gangrene, not specified as recurrent: Secondary | ICD-10-CM

## 2017-02-07 LAB — CBC WITH DIFFERENTIAL/PLATELET
Basophils Absolute: 0 10*3/uL (ref 0.0–0.1)
Basophils Relative: 0 %
EOS ABS: 0.1 10*3/uL (ref 0.0–0.7)
EOS PCT: 1 %
HCT: 38.3 % — ABNORMAL LOW (ref 39.0–52.0)
Hemoglobin: 13.2 g/dL (ref 13.0–17.0)
LYMPHS ABS: 2 10*3/uL (ref 0.7–4.0)
Lymphocytes Relative: 28 %
MCH: 32.5 pg (ref 26.0–34.0)
MCHC: 34.5 g/dL (ref 30.0–36.0)
MCV: 94.3 fL (ref 78.0–100.0)
MONO ABS: 0.6 10*3/uL (ref 0.1–1.0)
MONOS PCT: 9 %
NEUTROS PCT: 62 %
Neutro Abs: 4.6 10*3/uL (ref 1.7–7.7)
Platelets: 264 10*3/uL (ref 150–400)
RBC: 4.06 MIL/uL — ABNORMAL LOW (ref 4.22–5.81)
RDW: 14 % (ref 11.5–15.5)
WBC: 7.3 10*3/uL (ref 4.0–10.5)

## 2017-02-07 LAB — COMPREHENSIVE METABOLIC PANEL
ALK PHOS: 161 U/L — AB (ref 38–126)
ALT: 40 U/L (ref 17–63)
ANION GAP: 9 (ref 5–15)
AST: 54 U/L — ABNORMAL HIGH (ref 15–41)
Albumin: 3.7 g/dL (ref 3.5–5.0)
BILIRUBIN TOTAL: 0.9 mg/dL (ref 0.3–1.2)
BUN: 14 mg/dL (ref 6–20)
CALCIUM: 9.8 mg/dL (ref 8.9–10.3)
CO2: 24 mmol/L (ref 22–32)
Chloride: 99 mmol/L — ABNORMAL LOW (ref 101–111)
Creatinine, Ser: 0.74 mg/dL (ref 0.61–1.24)
GFR calc non Af Amer: >60 mL/min (ref >60)
Glucose, Bld: 105 mg/dL — ABNORMAL HIGH (ref 65–99)
Potassium: 4.1 mmol/L (ref 3.5–5.1)
SODIUM: 132 mmol/L — AB (ref 135–145)
TOTAL PROTEIN: 9.6 g/dL — AB (ref 6.5–8.1)

## 2017-02-07 LAB — PROTIME-INR
INR: 1.1
Prothrombin Time: 14.2 seconds (ref 11.4–15.2)

## 2017-02-07 MED ORDER — ALBUMIN HUMAN 25 % IV SOLN
INTRAVENOUS | Status: AC
  Filled 2017-02-07: qty 100

## 2017-02-07 MED ORDER — LACTULOSE 10 GM/15ML PO SOLN: 10.0000 g | Freq: Three times a day (TID) | ORAL | 2 refills | Status: DC

## 2017-02-07 NOTE — Procedures (Signed)
PreOperative Dx: Alcoholic cirrhosis, ascites Postoperative Dx: Alcoholic cirrhosis, ascites Procedure:   US guided paracentesis Radiologist:  Thornton Papas Anesthesia:  10 ml of1% lidocaine Specimen:  2.5 L of yellow ascitic fluid EBL:   < 1 ml Complications: None

## 2017-02-07 NOTE — Assessment & Plan Note (Signed)
Cirrhosis secondary to alcoholism. He has not had any alcohol in approximately 9 months. He is currently overdue for variceal screening. Additionally he has never had a colonoscopy before and his age 56. He is complaining of worsening ascites and edema as per below. He is currently on 100 mg of Lasix and 40 mg of Aldactone daily. He has not completed his hepatitis B series vaccine, although he still has the paperwork tablet completed. I recommended he start his hepatitis B vaccine and educated him on the rationale to prevent further liver damage. LV AP today as per below. I will update his labs including CBC, CMP, PTT/INR to check for worsening liver function and risk for bleeding. His wife states he is more forgetful than he lets on. He seems quite oriented and cognizant today. At this point I will trial him on lactulose to see if this improves his perceived mentation. Further workup as per below.  Proceed with TCS and EGD with Dr. Gala Romney in near future: the risks, benefits, and alternatives have been discussed with the patient in detail. The patient states understanding and desires to proceed.  The patient is currently only on Lasix and Aldactone. No other anticoagulants, anxiolytics, chronic pain medications, or antidepressants. He does have a history of alcohol abuse. Given his alcohol abuse history we will plan for the procedures on propofol/MAC to promote adequate sedation. His last labs do not indicate high risk for bleeding, but I will recheck these today.

## 2017-02-07 NOTE — Patient Instructions (Signed)
1. Have your labs drawn today while you're at [facility]. 2. We will put in orders for a paracentesis, if there is fluid to remove. Proceed to [facility] aphthae leave our office. 3. Start taking lactulose 3 times a day. Change the amount you take in order to have 2-3 soft bowel movements a day. 4. Based on results of your ultrasound we may change your fluid pills if we need to be removing more fluid 5. We will call you on Monday to schedule your colonoscopy and upper endoscopy. 6. Call us if he have any questions or concerns.

## 2017-02-07 NOTE — Progress Notes (Signed)
Referring Provider: No ref. provider found Primary Care Physician:  Patient, No Pcp Per Primary GI:  Dr. Gala Romney  Chief Complaint  Patient presents with  . abd swelling    HPI:   Thomas Pratt is a 56 y.o. male who presents for evaluation of abdominal swelling. The patient has a history of cirrhosis likely due to alcohol use. He was last seen in our office 71/12/2692 for alcoholic cirrhosis, unilateral inguinal hernia without obstruction. He did have a hospitalization in October 2017 for decompensated cirrhosis and alcoholic hepatitis. As last visit he stated he quit drinking and September 2017 is clinically felt well. He did have abdominal swelling and proceeded to the emergency department in March at which point he had 10 L of fluid removed via paracentesis. He also had 4 L removed in February 2018. At that time he had run out of his diuretic medications but at his last visit he was back on Lasix and Aldactone with no recurrent ascites over the previous 6 weeks. No other hepatic symptoms at his last visit. His main complaint at that time was right inguinal hernia which was enlarging which is occasionally tender.  Recommended continue diuresis with oral medications, recommend complete hepatitis B series if not already done, consider surgical evaluation for inguinal hernia repair, return for follow-up in 2 months to consider upper endoscopy for esophageal variceal screening as well as colonoscopy.  Labs associated with last visit showed low sodium at 133, normal kidney function, normal AST/ALT, alkaline phosphatase, bilirubin. INR at that time was 1.15. AFP was normal at 2.2. Based on these labs his MELD: 8, Child-Pugh: A (6).  Last abdominal ultrasound completed 11/22/2016 which noted hepatic findings consistent with cirrhosis without focal hepatic abnormality. Moderate ascites. 6.6 mm echogenic nonmobile focus anterior gallbladder wall tract suggest possible secondary to hyponatremia  although cholecystitis cannot be completely excluded, no biliary dilation noted.  He was a no-show for his follow-up visit 01/15/17.  Today he states he's doing ok overall. Noticed abdominal swelling about a month ago. Has gotten progressively worse since then. Surgery is holding until ascites better managed to move forward with hernia repair. He is not drinking, no ETOH since October 2017. Is still taking diuretics daily. Denies dyspnea, yellowing of skin/eyes, acute episodic confusion, tremors/flapping. Is having some bilateral LE edema. Denies chest pain, dyspnea, dizziness, lightheadedness, syncope, near syncope. Denies any other upper or lower GI symptoms. His main concern today is pain from hernia.  The patient's wife thinks that the patient is not being very forthcoming with his symptoms. She states she has noticed, and her family has noticed, that he is quite forgetful. Occasionally seems very sleepy. She is concerned about worse confusion and he is letting on.  He has never had a colonoscopy or EGD before.  Past Medical History:  Diagnosis Date  . Cirrhosis ([place])    likely ETOH use   . Hypertension     Past Surgical History:  Procedure Laterality Date  . NO PAST SURGERIES      Current Outpatient Prescriptions  Medication Sig Dispense Refill  . furosemide (LASIX) 40 MG tablet Take 1 tablet (40 mg total) by mouth daily. 30 tablet 5  . spironolactone (ALDACTONE) 100 MG tablet Take 1 tablet (100 mg total) by mouth daily. 30 tablet 5  . lactulose (CHRONULAC) 10 GM/15ML solution Take 15 mLs (10 g total) by mouth 3 (three) times daily. Titrate for 2-3 soft bowel movements a day. 946 mL 2   No  current facility-administered medications for this visit.     Allergies as of 02/07/2017  . (No Known Allergies)    Family History  Problem Relation Age of Onset  . Liver disease Neg Hx   . Colon cancer Neg Hx   . Colon polyps Neg Hx     Social History   Social History  . Marital  status: Divorced    Spouse name: N/A  . Number of children: N/A  . Years of education: N/A   Occupational History  . unemployed    Social History Main Topics  . Smoking status: Never Smoker  . Smokeless tobacco: Never Used  . Alcohol use No     Comment: Quit September 2017, heavy alcohol abuse previously  . Drug use: No  . Sexual activity: Not Currently   Other Topics Concern  . None   Social History Narrative  . None    Review of Systems: General: Negative for anorexia, weight loss, fever, chills. Eyes: Negative for vision changes.  ENT: Negative for hoarseness, difficulty swallowing. CV: Negative for chest pain, angina, palpitations, peripheral edema.  Respiratory: Negative for dyspnea at rest, cough, sputum, wheezing.  GI: See history of present illness. Derm: Negative for rash or itching.  Neuro: Negative for significant memory loss, confusion.  Endo: Negative for unusual weight change.  Heme: Negative for bruising or bleeding. Allergy: Negative for rash or hives.   Physical Exam: BP 130/73   Pulse 63   Temp (!) 96.9 F (36.1 C) (Oral)   Ht 5\' 8"  (1.727 m)   Wt 153 lb 3.2 oz (69.5 kg)   BMI 23.29 kg/m  General:   Alert and oriented. Pleasant and cooperative. Well-nourished and well-developed.  Head:  Normocephalic and atraumatic. Eyes:  Without icterus, sclera clear and conjunctiva pink.  Ears:  Normal auditory acuity. Cardiovascular:  S1, S2 present without murmurs appreciated. Extremities without clubbing. Trace to 1+ pitting edema bilateral lower extremities. Respiratory:  Clear to auscultation bilaterally. No wheezes, rales, or rhonchi. No distress.  Gastrointestinal:  +BS, soft, non-tender and non-distended. Some "fullness" and discomfort with upper abdominal palpation. No HSM noted. No guarding or rebound. Rectal:  Deferred  Musculoskalatal:  Symmetrical without gross deformities. Neurologic:  Alert and oriented x4;  grossly normal  neurologically. Psych:  Alert and cooperative. Normal mood and affect. Heme/Lymph/Immune: No excessive bruising noted.    02/11/2017 12:17 PM   Disclaimer: This note was dictated with voice recognition software. Similar sounding words can inadvertently be transcribed and may not be corrected upon review.

## 2017-02-10 ENCOUNTER — Telehealth: Payer: Self-pay

## 2017-02-10 NOTE — Telephone Encounter (Signed)
Called pt. TCS/EGD with Propofol with RMR scheduled for 03/06/17 at 12:45pm. Pt to come by office to get sample prep and instructions d/t he has no insurance. Will notify pt of pre-op appt after orders are entered for procedure.

## 2017-02-11 NOTE — Assessment & Plan Note (Signed)
The patient has an inguinal hernia. Surgical repair is pending better control of his ascites. He feels that he has had abdominal swelling in the past month. Has had paracentesis twice in the past with 10 mL removed at a time when he was out of his diuretics and a lower volume removed at a second incidents. No paracentesis in 2 months. Recommend continue to follow with surgeon for consideration of surgical repair of inguinal hernia.

## 2017-02-11 NOTE — Assessment & Plan Note (Signed)
Patient with mild lower extremity edema on diuretics. Has 1+ pitting edema through the mid calf and trace edema up to the knees. Recommend continue diuretics. We will refer him for an abdominal paracentesis at this time. Pending paracentesis results may need to consider increasing diuretics. Return for follow-up in 3 months. Call with any questions or concerns.

## 2017-02-11 NOTE — Progress Notes (Signed)
No pcp per patient 

## 2017-02-11 NOTE — Assessment & Plan Note (Signed)
The patient describes worsening abdominal swelling over the previous month. Denies drinking, no alcohol since October 2017. He is taking his diuretics at Lasix 100 mg and spironolactone 40 mg. His large semi-edema seems well controlled. His abdomen is soft, no tense ascites. However, he feels symptomatic. At this point I will refer him over to [facility] for consideration of abdominal paracentesis with albumin as needed. Labs ordered to check on liver compensation. Pending results of paracentesis will consider any need for increased diuretics. Return for follow-up in 3 months.

## 2017-02-12 ENCOUNTER — Other Ambulatory Visit: Payer: Self-pay

## 2017-02-12 DIAGNOSIS — K7460 Unspecified cirrhosis of liver: Secondary | ICD-10-CM

## 2017-02-12 DIAGNOSIS — Z1211 Encounter for screening for malignant neoplasm of colon: Secondary | ICD-10-CM

## 2017-02-12 NOTE — Telephone Encounter (Signed)
Called pt, informed of pre-op appt 02/27/17 at 11:00am. Reminded him to come by office to pick-up sample prep and instructions.

## 2017-02-27 ENCOUNTER — Encounter (HOSPITAL_COMMUNITY)
Admission: RE | Admit: 2017-02-27 | Discharge: 2017-02-27 | Disposition: A | Discharge status: Home / Self Care | Payer: Self-pay | Source: Ambulatory Visit | Attending: Internal Medicine | Admitting: Internal Medicine

## 2017-02-27 ENCOUNTER — Encounter (HOSPITAL_COMMUNITY): Payer: Self-pay

## 2017-03-06 ENCOUNTER — Encounter (HOSPITAL_COMMUNITY): Payer: Self-pay

## 2017-03-06 ENCOUNTER — Encounter (HOSPITAL_COMMUNITY)
Admission: RE | Disposition: A | Discharge status: Home / Self Care | Payer: Self-pay | Source: Ambulatory Visit | Attending: Internal Medicine

## 2017-03-06 ENCOUNTER — Ambulatory Visit (HOSPITAL_COMMUNITY): Payer: Self-pay | Admitting: Anesthesiology

## 2017-03-06 ENCOUNTER — Ambulatory Visit (HOSPITAL_COMMUNITY)
Admission: RE | Admit: 2017-03-06 | Discharge: 2017-03-06 | Disposition: A | Discharge status: Home / Self Care | Payer: Self-pay | Source: Ambulatory Visit | Attending: Internal Medicine | Admitting: Internal Medicine

## 2017-03-06 DIAGNOSIS — K3189 Other diseases of stomach and duodenum: Secondary | ICD-10-CM | POA: Insufficient documentation

## 2017-03-06 DIAGNOSIS — K210 Gastro-esophageal reflux disease with esophagitis: Secondary | ICD-10-CM | POA: Insufficient documentation

## 2017-03-06 DIAGNOSIS — K7011 Alcoholic hepatitis with ascites: Secondary | ICD-10-CM | POA: Insufficient documentation

## 2017-03-06 DIAGNOSIS — K7460 Unspecified cirrhosis of liver: Secondary | ICD-10-CM

## 2017-03-06 DIAGNOSIS — D122 Benign neoplasm of ascending colon: Secondary | ICD-10-CM | POA: Insufficient documentation

## 2017-03-06 DIAGNOSIS — I10 Essential (primary) hypertension: Secondary | ICD-10-CM | POA: Insufficient documentation

## 2017-03-06 DIAGNOSIS — I8500 Esophageal varices without bleeding: Secondary | ICD-10-CM

## 2017-03-06 DIAGNOSIS — K449 Diaphragmatic hernia without obstruction or gangrene: Secondary | ICD-10-CM | POA: Insufficient documentation

## 2017-03-06 DIAGNOSIS — K4090 Unilateral inguinal hernia, without obstruction or gangrene, not specified as recurrent: Secondary | ICD-10-CM | POA: Insufficient documentation

## 2017-03-06 DIAGNOSIS — Z1211 Encounter for screening for malignant neoplasm of colon: Secondary | ICD-10-CM

## 2017-03-06 DIAGNOSIS — K766 Portal hypertension: Secondary | ICD-10-CM | POA: Insufficient documentation

## 2017-03-06 DIAGNOSIS — Z1212 Encounter for screening for malignant neoplasm of rectum: Secondary | ICD-10-CM

## 2017-03-06 DIAGNOSIS — I8510 Secondary esophageal varices without bleeding: Secondary | ICD-10-CM | POA: Insufficient documentation

## 2017-03-06 HISTORY — PX: ESOPHAGOGASTRODUODENOSCOPY (EGD) WITH PROPOFOL: SHX5813

## 2017-03-06 HISTORY — PX: POLYPECTOMY: SHX5525

## 2017-03-06 HISTORY — PX: COLONOSCOPY WITH PROPOFOL: SHX5780

## 2017-03-06 SURGERY — COLONOSCOPY WITH PROPOFOL
Anesthesia: Monitor Anesthesia Care

## 2017-03-06 MED ORDER — MIDAZOLAM HCL 2 MG/2ML IJ SOLN
INTRAMUSCULAR | Status: AC
Start: 2017-03-06 — End: 2017-03-06
  Filled 2017-03-06: qty 2

## 2017-03-06 MED ORDER — MIDAZOLAM HCL 2 MG/2ML IJ SOLN
1.0000 mg | INTRAMUSCULAR | Status: AC
  Administered 2017-03-06: 2 mg via INTRAVENOUS

## 2017-03-06 MED ORDER — CHLORHEXIDINE GLUCONATE CLOTH 2 % EX PADS: 6.0000 | MEDICATED_PAD | Freq: Once | CUTANEOUS | Status: DC

## 2017-03-06 MED ORDER — LACTATED RINGERS IV SOLN
INTRAVENOUS | Status: DC
  Administered 2017-03-06 (×2): via INTRAVENOUS

## 2017-03-06 MED ORDER — FENTANYL CITRATE (PF) 100 MCG/2ML IJ SOLN
25.0000 ug | Freq: Once | INTRAMUSCULAR | Status: AC
  Administered 2017-03-06: 25 ug via INTRAVENOUS

## 2017-03-06 MED ORDER — FENTANYL CITRATE (PF) 100 MCG/2ML IJ SOLN
INTRAMUSCULAR | Status: AC
  Filled 2017-03-06: qty 2

## 2017-03-06 MED ORDER — MIDAZOLAM HCL 2 MG/2ML IJ SOLN
INTRAMUSCULAR | Status: AC
  Filled 2017-03-06: qty 2

## 2017-03-06 MED ORDER — LIDOCAINE VISCOUS 2 % MT SOLN
15.0000 mL | Freq: Once | OROMUCOSAL | Status: AC
  Administered 2017-03-06: 15 mL via OROMUCOSAL

## 2017-03-06 MED ORDER — MIDAZOLAM HCL 5 MG/5ML IJ SOLN
INTRAMUSCULAR | Status: DC | PRN
  Administered 2017-03-06: 2 mg via INTRAVENOUS

## 2017-03-06 MED ORDER — PROPOFOL 500 MG/50ML IV EMUL
INTRAVENOUS | Status: DC | PRN
  Administered 2017-03-06: 14:00:00 via INTRAVENOUS
  Administered 2017-03-06: 150 ug/kg/min via INTRAVENOUS

## 2017-03-06 NOTE — Anesthesia Preprocedure Evaluation (Signed)
Anesthesia Evaluation  Patient identified by MRN, date of birth, ID band Patient awake    Reviewed: Allergy & Precautions, NPO status , Patient's Chart, lab work & pertinent test results  Airway Mallampati: II  TM Distance: >3 FB Neck ROM: Full    Dental  (+) Poor Dentition, Missing, Chipped, Dental Advisory Given   Pulmonary neg pulmonary ROS,    breath sounds clear to auscultation       Cardiovascular hypertension, Pt. on medications  Rhythm:Regular Rate:Normal     Neuro/Psych negative neurological ROS     GI/Hepatic negative GI ROS, (+) Cirrhosis   ascites  substance abuse  alcohol use,   Endo/Other  negative endocrine ROS  Renal/GU      Musculoskeletal   Abdominal   Peds  Hematology negative hematology ROS (+)   Anesthesia Other Findings   Reproductive/Obstetrics                             Anesthesia Physical Anesthesia Plan  ASA: III  Anesthesia Plan: MAC   Post-op Pain Management:    Induction: Intravenous  PONV Risk Score and Plan:   Airway Management Planned: Simple Face Mask  Additional Equipment:   Intra-op Plan:   Post-operative Plan:   Informed Consent: I have reviewed the patients History and Physical, chart, labs and discussed the procedure including the risks, benefits and alternatives for the proposed anesthesia with the patient or authorized representative who has indicated his/her understanding and acceptance.     Plan Discussed with:   Anesthesia Plan Comments:         Anesthesia Quick Evaluation

## 2017-03-06 NOTE — Transfer of Care (Signed)
Immediate Anesthesia Transfer of Care Note  Patient: Thomas Pratt  Procedure(s) Performed: Procedure(s) with comments: COLONOSCOPY WITH PROPOFOL (N/A) - 12:45pm ESOPHAGOGASTRODUODENOSCOPY (EGD) WITH PROPOFOL (N/A) POLYPECTOMY - ascending colon;  Patient Location: PACU  Anesthesia Type:MAC  Level of Consciousness: awake, oriented and patient cooperative  Airway & Oxygen Therapy: Patient Spontanous Breathing  Post-op Assessment: Report given to RN and Post -op Vital signs reviewed and stable  Post vital signs: Reviewed and stable  Last Vitals:  Vitals:   03/06/17 1300 03/06/17 1305  BP: 98/61 95/61  Pulse:    Resp: 13 13  Temp:    SpO2: 94% 95%    Last Pain:  Vitals:   03/06/17 1114  TempSrc: Oral         Complications: No apparent anesthesia complications

## 2017-03-06 NOTE — Op Note (Signed)
[facility] Patient Name: Thomas Pratt Procedure Date: 03/06/2017 1:17 PM MRN: [id number] Date of Birth: Nov 24, 1960 Attending MD: Norvel Richards , MD CSN: [id number] Age: 56 Admit Type: Outpatient Procedure:                Upper GI endoscopy Indications:              Screening procedure Providers:                Norvel Richards, MD, Lurline Del, RN, Randa Spike, Technician Referring MD:              Medicines:                Propofol per Anesthesia Complications:            No immediate complications. Estimated Blood Loss:     Estimated blood loss: none. Procedure:                Pre-Anesthesia Assessment:                           - Prior to the procedure, a History and Physical                            was performed, and patient medications and                            allergies were reviewed. The patient's tolerance of                            previous anesthesia was also reviewed. The risks                            and benefits of the procedure and the sedation                            options and risks were discussed with the patient.                            All questions were answered, and informed consent                            was obtained. Prior Anticoagulants: The patient has                            taken no previous anticoagulant or antiplatelet                            agents. ASA Grade Assessment: III - A patient with                            severe systemic disease. After reviewing the risks  and benefits, the patient was deemed in                            satisfactory condition to undergo the procedure.                           After obtaining informed consent, the endoscope was                            passed under direct vision. Throughout the                            procedure, the patient's blood pressure, pulse, and                            oxygen  saturations were monitored continuously. The                            [id number] ([id number]) scope was introduced through the                            and advanced to the second part of duodenum. The                            upper GI endoscopy was accomplished without                            difficulty. The patient tolerated the procedure                            well. Scope In: 1:32:10 PM Scope Out: 1:36:33 PM Total Procedure Duration: 0 hours 4 minutes 23 seconds  Findings:      Grade I varices were found in the esophagus. 4 columns without bleeding       stigmata; Esophageal erosions within 5 mm of thnction. No apparent       Barrett's epithelium.      Mild portal hypertensive gastropathy was found in the entire examined       stomach.      Small hiatal hernia was present.      The duodenal bulb and second portion of the duodenum were normal. Impression:               Erosive reflux esophagitis                           - Grade I esophageal varices.                           - Portal hypertensive gastropathy.                           - Small hiatal hernia.                           - Normal duodenal bulb and second portion of the  duodenum.                           - No specimens collected. Moderate Sedation:      Moderate (conscious) sedation was personally administered by an       anesthesia professional. The following parameters were monitored: oxygen       saturation, heart rate, blood pressure, respiratory rate, EKG, adequacy       of pulmonary ventilation, and response to care. Total physician       intraservice time was 12 minutes. Recommendation:           - Patient has a contact number available for                            emergencies. The signs and symptoms of potential                            delayed complications were discussed with the                            patient. Return to normal activities tomorrow.                             Written discharge instructions were provided to the                            patient.                           - Resume previous diet. Begin Protonix 40 mg daily.                            Begin Nadolol 40 mg daily. See colonoscopy report.                           - Continue present medications.                           - Repeat upper endoscopy after studies are complete                            for surveillance based on pathology results.                           - Return to GI clinic in 1 month. Procedure Code(s):        --- Professional ---                           [phone], Esophagogastroduodenoscopy, flexible,                            transoral; diagnostic, including collection of                            specimen(s) by brushing or washing, when performed                            (  separate procedure) Diagnosis Code(s):        --- Professional ---                           I85.00, Esophageal varices without bleeding                           K76.6, Portal hypertension                           K31.89, Other diseases of stomach and duodenum                           K44.9, Diaphragmatic hernia without obstruction or                            gangrene                           Z13.810, Encounter for screening for upper                            gastrointestinal disorder CPT copyright 2016 [employer]. All rights reserved. The codes documented in this report are preliminary and upon coder review may  be revised to meet current compliance requirements. [name], MD Norvel Richards, MD 03/06/2017 2:12:05 PM This report has been signed electronically. Number of Addenda: 0

## 2017-03-06 NOTE — H&P (View-Only) (Signed)
Referring Provider: No ref. provider found Primary Care Physician:  Patient, No Pcp Per Primary GI:  Dr. Gala Romney  Chief Complaint  Patient presents with  . abd swelling    HPI:   Thomas Pratt is a 56 y.o. male who presents for evaluation of abdominal swelling. The patient has a history of cirrhosis likely due to alcohol use. He was last seen in our office 16/04/9603 for alcoholic cirrhosis, unilateral inguinal hernia without obstruction. He did have a hospitalization in October 2017 for decompensated cirrhosis and alcoholic hepatitis. As last visit he stated he quit drinking and September 2017 is clinically felt well. He did have abdominal swelling and proceeded to the emergency department in March at which point he had 10 L of fluid removed via paracentesis. He also had 4 L removed in February 2018. At that time he had run out of his diuretic medications but at his last visit he was back on Lasix and Aldactone with no recurrent ascites over the previous 6 weeks. No other hepatic symptoms at his last visit. His main complaint at that time was right inguinal hernia which was enlarging which is occasionally tender.  Recommended continue diuresis with oral medications, recommend complete hepatitis B series if not already done, consider surgical evaluation for inguinal hernia repair, return for follow-up in 2 months to consider upper endoscopy for esophageal variceal screening as well as colonoscopy.  Labs associated with last visit showed low sodium at 133, normal kidney function, normal AST/ALT, alkaline phosphatase, bilirubin. INR at that time was 1.15. AFP was normal at 2.2. Based on these labs his MELD: 8, Child-Pugh: A (6).  Last abdominal ultrasound completed 11/22/2016 which noted hepatic findings consistent with cirrhosis without focal hepatic abnormality. Moderate ascites. 6.6 mm echogenic nonmobile focus anterior gallbladder wall tract suggest possible secondary to hyponatremia  although cholecystitis cannot be completely excluded, no biliary dilation noted.  He was a no-show for his follow-up visit 01/15/17.  Today he states he's doing ok overall. Noticed abdominal swelling about a month ago. Has gotten progressively worse since then. Surgery is holding until ascites better managed to move forward with hernia repair. He is not drinking, no ETOH since October 2017. Is still taking diuretics daily. Denies dyspnea, yellowing of skin/eyes, acute episodic confusion, tremors/flapping. Is having some bilateral LE edema. Denies chest pain, dyspnea, dizziness, lightheadedness, syncope, near syncope. Denies any other upper or lower GI symptoms. His main concern today is pain from hernia.  The patient's wife thinks that the patient is not being very forthcoming with his symptoms. She states she has noticed, and her family has noticed, that he is quite forgetful. Occasionally seems very sleepy. She is concerned about worse confusion and he is letting on.  He has never had a colonoscopy or EGD before.  Past Medical History:  Diagnosis Date  . Cirrhosis ([place])    likely ETOH use   . Hypertension     Past Surgical History:  Procedure Laterality Date  . NO PAST SURGERIES      Current Outpatient Prescriptions  Medication Sig Dispense Refill  . furosemide (LASIX) 40 MG tablet Take 1 tablet (40 mg total) by mouth daily. 30 tablet 5  . spironolactone (ALDACTONE) 100 MG tablet Take 1 tablet (100 mg total) by mouth daily. 30 tablet 5  . lactulose (CHRONULAC) 10 GM/15ML solution Take 15 mLs (10 g total) by mouth 3 (three) times daily. Titrate for 2-3 soft bowel movements a day. 946 mL 2   No  current facility-administered medications for this visit.     Allergies as of 02/07/2017  . (No Known Allergies)    Family History  Problem Relation Age of Onset  . Liver disease Neg Hx   . Colon cancer Neg Hx   . Colon polyps Neg Hx     Social History   Social History  . Marital  status: Divorced    Spouse name: N/A  . Number of children: N/A  . Years of education: N/A   Occupational History  . unemployed    Social History Main Topics  . Smoking status: Never Smoker  . Smokeless tobacco: Never Used  . Alcohol use No     Comment: Quit September 2017, heavy alcohol abuse previously  . Drug use: No  . Sexual activity: Not Currently   Other Topics Concern  . None   Social History Narrative  . None    Review of Systems: General: Negative for anorexia, weight loss, fever, chills. Eyes: Negative for vision changes.  ENT: Negative for hoarseness, difficulty swallowing. CV: Negative for chest pain, angina, palpitations, peripheral edema.  Respiratory: Negative for dyspnea at rest, cough, sputum, wheezing.  GI: See history of present illness. Derm: Negative for rash or itching.  Neuro: Negative for significant memory loss, confusion.  Endo: Negative for unusual weight change.  Heme: Negative for bruising or bleeding. Allergy: Negative for rash or hives.   Physical Exam: BP 130/73   Pulse 63   Temp (!) 96.9 F (36.1 C) (Oral)   Ht 5\' 8"  (1.727 m)   Wt 153 lb 3.2 oz (69.5 kg)   BMI 23.29 kg/m  General:   Alert and oriented. Pleasant and cooperative. Well-nourished and well-developed.  Head:  Normocephalic and atraumatic. Eyes:  Without icterus, sclera clear and conjunctiva pink.  Ears:  Normal auditory acuity. Cardiovascular:  S1, S2 present without murmurs appreciated. Extremities without clubbing. Trace to 1+ pitting edema bilateral lower extremities. Respiratory:  Clear to auscultation bilaterally. No wheezes, rales, or rhonchi. No distress.  Gastrointestinal:  +BS, soft, non-tender and non-distended. Some "fullness" and discomfort with upper abdominal palpation. No HSM noted. No guarding or rebound. Rectal:  Deferred  Musculoskalatal:  Symmetrical without gross deformities. Neurologic:  Alert and oriented x4;  grossly normal  neurologically. Psych:  Alert and cooperative. Normal mood and affect. Heme/Lymph/Immune: No excessive bruising noted.    02/11/2017 12:17 PM   Disclaimer: This note was dictated with voice recognition software. Similar sounding words can inadvertently be transcribed and may not be corrected upon review.

## 2017-03-06 NOTE — Interval H&P Note (Signed)
History and Physical Interval Note:  03/06/2017 1:09 PM  Danial Hlavac  has presented today for surgery, with the diagnosis of screening colonoscopy, cirrhosis  The various methods of treatment have been discussed with the patient and family. After consideration of risks, benefits and other options for treatment, the patient has consented to  Procedure(s) with comments: COLONOSCOPY WITH PROPOFOL (N/A) - 12:45pm ESOPHAGOGASTRODUODENOSCOPY (EGD) WITH PROPOFOL (N/A) as a surgical intervention .  The patient's history has been reviewed, patient examined, no change in status, stable for surgery.  I have reviewed the patient's chart and labs.  Questions were answered to the patient's satisfaction.     Ilanna Deihl  No change. Screening EGD and colonoscopy per plan.  The risks, benefits, limitations, imponderables and alternatives regarding both EGD and colonoscopy have been reviewed with the patient. Questions have been answered. All parties agreeable.

## 2017-03-06 NOTE — Discharge Instructions (Signed)
°Colonoscopy °Discharge Instructions ° °Read the instructions outlined below and refer to this sheet in the next few weeks. These discharge instructions provide you with general information on caring for yourself after you leave the hospital. Your doctor may also give you specific instructions. While your treatment has been planned according to the most current medical practices available, unavoidable complications occasionally occur. If you have any problems or questions after discharge, call Dr. Rourk at [phone]. °ACTIVITY °· You may resume your regular activity, but move at a slower pace for the next 24 hours.  °· Take frequent rest periods for the next 24 hours.  °· Walking will help get rid of the air and reduce the bloated feeling in your belly (abdomen).  °· No driving for 24 hours (because of the medicine (anesthesia) used during the test).   °· Do not sign any important legal documents or operate any machinery for 24 hours (because of the anesthesia used during the test).  °NUTRITION °· Drink plenty of fluids.  °· You may resume your normal diet as instructed by your doctor.  °· Begin with a light meal and progress to your normal diet. Heavy or fried foods are harder to digest and may make you feel sick to your stomach (nauseated).  °· Avoid alcoholic beverages for 24 hours or as instructed.  °MEDICATIONS °· You may resume your normal medications unless your doctor tells you otherwise.  °WHAT YOU CAN EXPECT TODAY °· Some feelings of bloating in the abdomen.  °· Passage of more gas than usual.  °· Spotting of blood in your stool or on the toilet paper.  °IF YOU HAD POLYPS REMOVED DURING THE COLONOSCOPY: °· No aspirin products for 7 days or as instructed.  °· No alcohol for 7 days or as instructed.  °· Eat a soft diet for the next 24 hours.  °FINDING OUT THE RESULTS OF YOUR TEST °Not all test results are available during your visit. If your test results are not back during the visit, make an appointment  with your caregiver to find out the results. Do not assume everything is normal if you have not heard from your caregiver or the medical facility. It is important for you to follow up on all of your test results.  °SEEK IMMEDIATE MEDICAL ATTENTION IF: °· You have more than a spotting of blood in your stool.  °· Your belly is swollen (abdominal distention).  °· You are nauseated or vomiting.  °· You have a temperature over 101.  °· You have abdominal pain or discomfort that is severe or gets worse throughout the day.  °EGD °Discharge instructions °Please read the instructions outlined below and refer to this sheet in the next few weeks. These discharge instructions provide you with general information on caring for yourself after you leave the hospital. Your doctor may also give you specific instructions. While your treatment has been planned according to the most current medical practices available, unavoidable complications occasionally occur. If you have any problems or questions after discharge, please call your doctor. °ACTIVITY °· You may resume your regular activity but move at a slower pace for the next 24 hours.  °· Take frequent rest periods for the next 24 hours.  °· Walking will help expel (get rid of) the air and reduce the bloated feeling in your abdomen.  °· No driving for 24 hours (because of the anesthesia (medicine) used during the test).  °· You may shower.  °· Do not sign any important   legal documents or operate any machinery for 24 hours (because of the anesthesia used during the test).  NUTRITION  Drink plenty of fluids.   You may resume your normal diet.   Begin with a light meal and progress to your normal diet.   Avoid alcoholic beverages for 24 hours or as instructed by your caregiver.  MEDICATIONS  You may resume your normal medications unless your caregiver tells you otherwise.  WHAT YOU CAN EXPECT TODAY  You may experience abdominal discomfort such as a feeling of fullness  or gas pains.  FOLLOW-UP  Your doctor will discuss the results of your test with you.  SEEK IMMEDIATE MEDICAL ATTENTION IF ANY OF THE FOLLOWING OCCUR:  Excessive nausea (feeling sick to your stomach) and/or vomiting.   Severe abdominal pain and distention (swelling).   Trouble swallowing.   Temperature over 101 F (37.8 C).   GERD information provided  Informational esophageal varices provided  Begin Protonix 40 mg daily  Begin  Nadolol 40 mg daily  Colon polyp information provided  Office visit with [place] in 4 weeks  Further recommendations to follow pending review of pathology report  Rectal bleeding or vomiting of blood.   Esophageal Varices The esophagus is the passage that connects the throat to the stomach. Esophageal varices are blood vessels in the esophagus that have become enlarged. They develop when extra blood is forced to flow through them because the blood's normal pathway is blocked. Without treatment these blood vessels eventually break and bleed (hemorrhage). A hemorrhage is life-threatening. What are the causes? This condition may be caused by:  Scarring of the liver (cirrhosis) due to alcoholism. This is the most common cause.  Liver disease.  Severe heart failure.  A blood clot in the portal vein.  Sarcoidosis. This is an inflammatory disease that can affect the liver.  Schistosomiasis. This is a parasitic infection that can cause liver damage.  What are the signs or symptoms? Usually there are no symptoms unless the esophageal varices bleed. Symptoms of bleeding esophageal varices include:  Vomiting material that is bright red or that is black and looks like coffee grounds.  Coughing up blood.  Black, tarry stools.  Dizziness or lightheadedness.  Low blood pressure.  Loss of consciousness.  How is this diagnosed? This condition is diagnosed with tests, such as:  Endoscopy. During this test a thin, lighted tube is inserted through  the mouth and into the esophagus.  Blood tests. These may be done to check liver function, blood counts, and the body's ability to form blood clots.  How is this treated? This condition may be treated with:  Medicines that reduce pressure in the esophageal varices and reduce the risk of bleeding.  Procedures to reduce pressure in the esophageal varices and reduce the risk of bleeding or stop bleeding. These include: ? Variceal ligation. In this procedure, a rubber band is placed around the esophageal varices to keep them from bleeding. ? Injection therapy. This treatment involves an injection that causes the esophageal varices to shrink and close (sclerotherapy). Medicines that tighten blood vessels or alter blood flow may also be used. ? Balloon tamponade. In this procedure, a tube is put into the esophagus and a balloon is passed through it and inflated. ? Transjugular intrahepatic portosystemic shunt (TIPS) placement. In this procedure, a small tube is placed within the liver veins. This decreases blood flow and pressure to the esophageal varices.  A liver transplant. This may be done if other treatments do  not work.  Follow these instructions at home:  Take medicines only as directed by your health care provider.  Follow your health care provider's instructions about rest and physical activity. Get help right away if:  You have any symptoms of this condition after treatment.  You are unable to eat or drink.  You have chest pain. This information is not intended to replace advice given to you by your health care provider. Make sure you discuss any questions you have with your health care provider. Document Released: 09/28/2003 Document Revised: 12/14/2015 Document Reviewed: 07/04/2014 Elsevier Interactive Patient Education  2018 [employer].  Gastroesophageal Reflux Disease, Adult Normally, food travels down the esophagus and stays in the stomach to be digested. However, when a  person has gastroesophageal reflux disease (GERD), food and stomach acid move back up into the esophagus. When this happens, the esophagus becomes sore and inflamed. Over time, GERD can create small holes (ulcers) in the lining of the esophagus. What are the causes? This condition is caused by a problem with the muscle between the esophagus and the stomach (lower esophageal sphincter, or LES). Normally, the LES muscle closes after food passes through the esophagus to the stomach. When the LES is weakened or abnormal, it does not close properly, and that allows food and stomach acid to go back up into the esophagus. The LES can be weakened by certain dietary substances, medicines, and medical conditions, including: Tobacco use. Pregnancy. Having a hiatal hernia. Heavy alcohol use. Certain foods and beverages, such as coffee, chocolate, onions, and peppermint.  What increases the risk? This condition is more likely to develop in: People who have an increased body weight. People who have connective tissue disorders. People who use NSAID medicines.  What are the signs or symptoms? Symptoms of this condition include: Heartburn. Difficult or painful swallowing. The feeling of having a lump in the throat. Abitter taste in the mouth. Bad breath. Having a large amount of saliva. Having an upset or bloated stomach. Belching. Chest pain. Shortness of breath or wheezing. Ongoing (chronic) cough or a night-time cough. Wearing away of tooth enamel. Weight loss.  Different conditions can cause chest pain. Make sure to see your health care provider if you experience chest pain. How is this diagnosed? Your health care provider will take a medical history and perform a physical exam. To determine if you have mild or severe GERD, your health care provider may also monitor how you respond to treatment. You may also have other tests, including: An endoscopy toexamine your stomach and esophagus with a  small camera. A test thatmeasures the acidity level in your esophagus. A test thatmeasures how much pressure is on your esophagus. A barium swallow or modified barium swallow to show the shape, size, and functioning of your esophagus.  How is this treated? The goal of treatment is to help relieve your symptoms and to prevent complications. Treatment for this condition may vary depending on how severe your symptoms are. Your health care provider may recommend: Changes to your diet. Medicine. Surgery.  Follow these instructions at home: Diet Follow a diet as recommended by your health care provider. This may involve avoiding foods and drinks such as: Coffee and tea (with or without caffeine). Drinks that containalcohol. Energy drinks and sports drinks. Carbonated drinks or sodas. Chocolate and cocoa. Peppermint and mint flavorings. Garlic and onions. Horseradish. Spicy and acidic foods, including peppers, chili powder, curry powder, vinegar, hot sauces, and barbecue sauce. Citrus fruit juices and citrus  fruits, such as oranges, lemons, and limes. Tomato-based foods, such as red sauce, chili, salsa, and pizza with red sauce. Fried and fatty foods, such as donuts, french fries, potato chips, and high-fat dressings. High-fat meats, such as hot dogs and fatty cuts of red and white meats, such as rib eye steak, sausage, ham, and bacon. High-fat dairy items, such as whole milk, butter, and cream cheese. Eat small, frequent meals instead of large meals. Avoid drinking large amounts of liquid with your meals. Avoid eating meals during the 2-3 hours before bedtime. Avoid lying down right after you eat. Do not exercise right after you eat. General instructions Pay attention to any changes in your symptoms. Take over-the-counter and prescription medicines only as told by your health care provider. Do not take aspirin, ibuprofen, or other NSAIDs unless your health care provider told you to do  so. Do not use any tobacco products, including cigarettes, chewing tobacco, and e-cigarettes. If you need help quitting, ask your health care provider. Wear loose-fitting clothing. Do not wear anything tight around your waist that causes pressure on your abdomen. Raise (elevate) the head of your bed 6 inches (15cm). Try to reduce your stress, such as with yoga or meditation. If you need help reducing stress, ask your health care provider. If you are overweight, reduce your weight to an amount that is healthy for you. Ask your health care provider for guidance about a safe weight loss goal. Keep all follow-up visits as told by your health care provider. This is important. Contact a health care provider if: You have new symptoms. You have unexplained weight loss. You have difficulty swallowing, or it hurts to swallow. You have wheezing or a persistent cough. Your symptoms do not improve with treatment. You have a hoarse voice. Get help right away if: You have pain in your arms, neck, jaw, teeth, or back. You feel sweaty, dizzy, or light-headed. You have chest pain or shortness of breath. You vomit and your vomit looks like blood or coffee grounds. You faint. Your stool is bloody or black. You cannot swallow, drink, or eat. This information is not intended to replace advice given to you by your health care provider. Make sure you discuss any questions you have with your health care provider. Document Released: 04/17/2005 Document Revised: 12/06/2015 Document Reviewed: 11/02/2014 Elsevier Interactive Patient Education  2017 [employer].

## 2017-03-06 NOTE — Anesthesia Procedure Notes (Signed)
Procedure Name: MAC Date/Time: 03/06/2017 1:20 PM Performed by: Andree Elk, Kiona Blume A Pre-anesthesia Checklist: Patient identified, Emergency Drugs available, Suction available, Patient being monitored and Timeout performed Oxygen Delivery Method: Simple face mask

## 2017-03-06 NOTE — Anesthesia Postprocedure Evaluation (Signed)
Anesthesia Post Note  Patient: Thomas Pratt  Procedure(s) Performed: Procedure(s) (LRB): COLONOSCOPY WITH PROPOFOL (N/A) ESOPHAGOGASTRODUODENOSCOPY (EGD) WITH PROPOFOL (N/A) POLYPECTOMY  Patient location during evaluation: PACU Anesthesia Type: MAC Level of consciousness: awake and alert, oriented and patient cooperative Pain management: pain level controlled Vital Signs Assessment: post-procedure vital signs reviewed and stable Respiratory status: spontaneous breathing and respiratory function stable Cardiovascular status: stable Postop Assessment: no signs of nausea or vomiting Anesthetic complications: no     Last Vitals:  Vitals:   03/06/17 1300 03/06/17 1305  BP: 98/61 95/61  Pulse:    Resp: 13 13  Temp:    SpO2: 94% 95%    Last Pain:  Vitals:   03/06/17 1114  TempSrc: Oral                 Zoua Caporaso A

## 2017-03-07 NOTE — Op Note (Signed)
[facility] Patient Name: Thomas Pratt Procedure Date: 03/06/2017 1:39 PM MRN: [id number] Date of Birth: [birth year] Attending MD: Norvel Richards , MD CSN: [id number] Age: 56 Admit Type: Outpatient Procedure:                Colonoscopy Indications:              Screening for colorectal malignant neoplasm Providers:                Norvel Richards, MD, Lurline Del, RN, Randa Spike, Technician Referring MD:              Medicines:                Midazolam mg IV, Propofol per Anesthesia Complications:            No immediate complications. Estimated Blood Loss:     Estimated blood loss was minimal. Procedure:                Pre-Anesthesia Assessment:                           - Prior to the procedure, a History and Physical                            was performed, and patient medications and                            allergies were reviewed. The patient's tolerance of                            previous anesthesia was also reviewed. The risks                            and benefits of the procedure and the sedation                            options and risks were discussed with the patient.                            All questions were answered, and informed consent                            was obtained. Prior Anticoagulants: The patient has                            taken no previous anticoagulant or antiplatelet                            agents. ASA Grade Assessment: III - A patient with                            severe systemic disease. After reviewing the risks  and benefits, the patient was deemed in                            satisfactory condition to undergo the procedure.                           After obtaining informed consent, the colonoscope                            was passed under direct vision. Throughout the                            procedure, the patient's blood pressure, pulse, and                             oxygen saturations were monitored continuously. The                            Colonoscope was introduced through the anus and                            advanced to the the cecum, identified by                            appendiceal orifice and ileocecal valve. The                            ileocecal valve, appendiceal orifice, and rectum                            were photographed. The entire colon was well                            visualized. The colonoscopy was performed without                            difficulty. The patient tolerated the procedure                            well. The quality of the bowel preparation was                            adequate. Scope In: 1:44:06 PM Scope Out: 1:58:33 PM Scope Withdrawal Time: 0 hours 10 minutes 31 seconds  Total Procedure Duration: 0 hours 14 minutes 27 seconds  Findings:      The perianal and digital rectal examinations were normal.      A polyp was found in the ascending colon. The polyp was sessile. The       polyp was removed with a cold snare. Resection and retrieval were       complete. Estimated blood loss was minimal.      The exam was otherwise without abnormality on direct and retroflexion       views. Impression:               - One polyp  in the ascending colon, removed with a                            cold snare. Resected and retrieved.                           - The examination was otherwise normal on direct                            and retroflexion views. Moderate Sedation:      Moderate (conscious) sedation was personally administered by an       anesthesia professional. The following parameters were monitored: oxygen       saturation, heart rate, blood pressure, respiratory rate, EKG, adequacy       of pulmonary ventilation, and response to care. Total physician       intraservice time was 38 minutes. Recommendation:           - Patient has a contact number available for                             emergencies. The signs and symptoms of potential                            delayed complications were discussed with the                            patient. Return to normal activities tomorrow.                            Written discharge instructions were provided to the                            patient.                           - Resume previous diet.                           - Continue present medications.                           - Await pathology results.                           - Repeat colonoscopy date to be determined after                            pending pathology results are reviewed for                            surveillance based on pathology results.                           - Return to GI clinic (date not yet determined). Procedure Code(s):        --- Professional ---  45385, Colonoscopy, flexible; with removal of                            tumor(s), polyp(s), or other lesion(s) by snare                            technique Diagnosis Code(s):        --- Professional ---                           Z12.11, Encounter for screening for malignant                            neoplasm of colon                           D12.2, Benign neoplasm of ascending colon CPT copyright 2016 American Medical Association. All rights reserved. The codes documented in this report are preliminary and upon coder review may  be revised to meet current compliance requirements. Cristopher Estimable. Idell Hissong, MD Norvel Richards, MD 03/07/2017 12:36:10 PM This report has been signed electronically. Number of Addenda: 0

## 2017-03-10 ENCOUNTER — Encounter (HOSPITAL_COMMUNITY): Payer: Self-pay | Admitting: Internal Medicine

## 2017-03-11 ENCOUNTER — Encounter: Payer: Self-pay | Admitting: Internal Medicine

## 2017-03-11 ENCOUNTER — Encounter: Payer: Self-pay | Admitting: Gastroenterology

## 2017-03-11 ENCOUNTER — Ambulatory Visit (INDEPENDENT_AMBULATORY_CARE_PROVIDER_SITE_OTHER): Payer: Self-pay | Admitting: Gastroenterology

## 2017-03-11 VITALS — BP 101/61 | HR 56 | Temp 96.7°F | Ht 67.0 in | Wt 153.4 lb

## 2017-03-11 DIAGNOSIS — K7031 Alcoholic cirrhosis of liver with ascites: Secondary | ICD-10-CM

## 2017-03-11 NOTE — Progress Notes (Signed)
Primary Care Physician: Patient, No Pcp Per  Primary Gastroenterologist:  Garfield Cornea, MD   Chief Complaint  Patient presents with  . Cirrhosis    HPI: Thomas Pratt is a 56 y.o. male here For follow-up cirrhosis. This appointment was made back in June but since then he is brought in urgent office visit for abdominal swelling in July. He had been to see Dr. Arnoldo Morale for consideration of inguinal hernia repair but he wanted to postpone until his ascites was better managed because of risk of recurrent hernia. Patient had decompensated cirrhosis and alcoholic hepatitis in October 2017 but quit drinking in September 2017. He has had multiple abdominal paracenteses, total of 10 L removed in November 2017, he had 4 L removed in February during her ED visit (ran out of diuretics at that time) and an additional 10 L removed in March 2018. He had no recurrent ascites between March and June. Last abdominal paracenteses in July with removal of 2.5 L.  Clinically patient feels well. He had his first ever screening colonoscopy last week. Single polyp removed from the ascending colon, tubular adenoma, health permitting he will have another colonoscopy in 5 years. He had an EGD as well to screen for varices. He had erosive reflux esophagitis, grade 1 esophageal varices, portal hypertensive gastropathy. He was started on pantoprazole and nadolol 40 mg each daily.  Clinically he feels well. Denies abdominal pain. No recurrent fluid. Continues to have symptomatic inguinal hernia and really wants this to be repaired. Has been on nadolol for a few days, heart rate in the 56 range today. Denies melena rectal bleeding. Has 3-4 bowel movements daily. No heartburn or indigestion. Appetite is good. No alcohol 11 months. Complains of chronic tingling sensation in his feet. I have recommended him establish care with a PCP, he states he has contacts in [place] and plans to do this soon.  Up to date on labs in  01/2017, MELD 14.   Current Outpatient Prescriptions  Medication Sig Dispense Refill  . furosemide (LASIX) 40 MG tablet Take 1 tablet (40 mg total) by mouth daily. 30 tablet 5  . nadolol (CORGARD) 40 MG tablet Take 40 mg by mouth daily.    . pantoprazole (PROTONIX) 40 MG tablet Take 40 mg by mouth daily.    Marland Kitchen spironolactone (ALDACTONE) 100 MG tablet Take 1 tablet (100 mg total) by mouth daily. 30 tablet 5   No current facility-administered medications for this visit.     Allergies as of 03/11/2017  . (No Known Allergies)    ROS:  General: Negative for anorexia, weight loss, fever, chills, fatigue, weakness. ENT: Negative for hoarseness, difficulty swallowing , nasal congestion. CV: Negative for chest pain, angina, palpitations, dyspnea on exertion, peripheral edema.  Respiratory: Negative for dyspnea at rest, dyspnea on exertion, cough, sputum, wheezing.  GI: See history of present illness. GU:  Negative for dysuria, hematuria, urinary incontinence, urinary frequency, nocturnal urination.  Endo: Negative for unusual weight change.    Physical Examination:   BP 101/61   Pulse (!) 56   Temp (!) 96.7 F (35.9 C) (Oral)   Ht 5\' 7"  (1.702 m)   Wt 153 lb 6.4 oz (69.6 kg)   BMI 24.03 kg/m   General: Well-nourished, well-developed in no acute distress.  Eyes: No icterus. Mouth: Oropharyngeal mucosa moist and pink , no lesions erythema or exudate. Lungs: Clear to auscultation bilaterally.  Heart: Regular rate and rhythm, no murmurs rubs or gallops.  Abdomen:  Bowel sounds are normal, nontender, nondistended, no hepatosplenomegaly or masses, no abdominal bruits or hernia , no rebound or guarding.   Extremities: No lower extremity edema. No clubbing or deformities. Neuro: Alert and oriented x 4   Skin: Warm and dry, no jaundice.   Psych: Alert and cooperative, normal mood and affect.  Labs:  Lab Results  Component Value Date   CREATININE 0.74 02/07/2017   BUN 14 02/07/2017    NA 132 (L) 02/07/2017   K 4.1 02/07/2017   CL 99 (L) 02/07/2017   CO2 24 02/07/2017   Lab Results  Component Value Date   ALT 40 02/07/2017   AST 54 (H) 02/07/2017   ALKPHOS 161 (H) 02/07/2017   BILITOT 0.9 02/07/2017   Lab Results  Component Value Date   WBC 7.3 02/07/2017   HGB 13.2 02/07/2017   HCT 38.3 (L) 02/07/2017   MCV 94.3 02/07/2017   PLT 264 02/07/2017   Lab Results  Component Value Date   INR 1.10 02/07/2017   INR 1.15 11/22/2016   INR 1.12 09/28/2016    Imaging Studies: No results found.

## 2017-03-11 NOTE — Assessment & Plan Note (Signed)
Stable. Last etoh 03/2016. Grade 1 esophageal varices, start nadolol 40mg  daily with HR in upper 50s today. Asymptomatic. Biggest concern of having his inguinal hernia repair. Patient without significant ascites from March to June. Had 2.5 L of fluid removed in July. Really no significant ascites on exam today. No lower extremity edema. We'll continue diuretic regimen as before. He will go back to see Dr. Arnoldo Morale regarding possible inguinal hernia repair. We will have to stay on top of his fluid overload/third spacing given risk of hernia repair failure with recurrent ascites. Continue to consume 2 g sodium diet.  We'll have patient come back in 4 weeks to make sure he is tolerating nadolol since he just started this couple of days ago. Can also reassess for any recurrent ascites. He will due for hepatoma surveillance in November and repeat labs in January.

## 2017-03-11 NOTE — Progress Notes (Signed)
No pcp

## 2017-03-11 NOTE — Patient Instructions (Signed)
1. Continue nadolol, pantoprazole, lasix, aldactone as before.  2. Call if you feel increased weakness or dizziness, short of breath or have recurrent swelling.  3. You should consider getting a regular family doctor to evaluate your leg pain, and non-liver issues.  4. Return to the office in 4 weeks.

## 2017-04-03 ENCOUNTER — Telehealth: Payer: Self-pay | Admitting: Internal Medicine

## 2017-04-03 NOTE — Telephone Encounter (Signed)
Pt is aware.  

## 2017-04-03 NOTE — Telephone Encounter (Signed)
[place] PATIENT ABOUT WHAT MEDICATION HE CAN TAKE FOR A COLD

## 2017-04-03 NOTE — Telephone Encounter (Signed)
I would recommend chloraseptic spray or losenge for throat pain. Can try mucinex without the "D" for congestion if he has that.

## 2017-04-03 NOTE — Telephone Encounter (Signed)
I spoke with the pt, he is having a sore throat when he wakes up in the morning and a runny nose. He wants to know what is safe for him to take?

## 2017-04-09 ENCOUNTER — Ambulatory Visit: Payer: Self-pay | Admitting: Gastroenterology

## 2017-04-15 ENCOUNTER — Other Ambulatory Visit: Payer: Self-pay

## 2017-04-15 DIAGNOSIS — K7031 Alcoholic cirrhosis of liver with ascites: Secondary | ICD-10-CM

## 2017-04-18 ENCOUNTER — Ambulatory Visit: Payer: Self-pay | Admitting: Nurse Practitioner

## 2017-04-25 ENCOUNTER — Ambulatory Visit (INDEPENDENT_AMBULATORY_CARE_PROVIDER_SITE_OTHER): Payer: Self-pay | Admitting: Gastroenterology

## 2017-04-25 ENCOUNTER — Encounter: Payer: Self-pay | Admitting: Gastroenterology

## 2017-04-25 ENCOUNTER — Other Ambulatory Visit (HOSPITAL_COMMUNITY)
Admission: RE | Admit: 2017-04-25 | Discharge: 2017-04-25 | Disposition: A | Discharge status: Home / Self Care | Payer: Self-pay | Source: Ambulatory Visit | Attending: Gastroenterology | Admitting: Gastroenterology

## 2017-04-25 VITALS — BP 130/82 | HR 71 | Temp 96.5°F | Ht 68.0 in | Wt 162.6 lb

## 2017-04-25 DIAGNOSIS — K7030 Alcoholic cirrhosis of liver without ascites: Secondary | ICD-10-CM | POA: Insufficient documentation

## 2017-04-25 LAB — CBC WITH DIFFERENTIAL/PLATELET
BASOS PCT: 0 %
Basophils Absolute: 0 10*3/uL (ref 0.0–0.1)
EOS ABS: 0.1 10*3/uL (ref 0.0–0.7)
EOS PCT: 1 %
HCT: 38 % — ABNORMAL LOW (ref 39.0–52.0)
Hemoglobin: 12.8 g/dL — ABNORMAL LOW (ref 13.0–17.0)
LYMPHS ABS: 2.4 10*3/uL (ref 0.7–4.0)
Lymphocytes Relative: 30 %
MCH: 32.7 pg (ref 26.0–34.0)
MCHC: 33.7 g/dL (ref 30.0–36.0)
MCV: 97.2 fL (ref 78.0–100.0)
MONO ABS: 0.4 10*3/uL (ref 0.1–1.0)
MONOS PCT: 5 %
Neutro Abs: 5.1 10*3/uL (ref 1.7–7.7)
Neutrophils Relative %: 64 %
PLATELETS: 249 10*3/uL (ref 150–400)
RBC: 3.91 MIL/uL — ABNORMAL LOW (ref 4.22–5.81)
RDW: 13.9 % (ref 11.5–15.5)
WBC: 8 10*3/uL (ref 4.0–10.5)

## 2017-04-25 LAB — PROTIME-INR
INR: 1.09
PROTHROMBIN TIME: 14 s (ref 11.4–15.2)

## 2017-04-25 MED ORDER — NADOLOL 40 MG PO TABS: 40.0000 mg | ORAL_TABLET | Freq: Every day | ORAL | 3 refills | Status: DC

## 2017-04-25 NOTE — Progress Notes (Signed)
Primary Care Physician:  Patient, No Pcp Per Primary GI: Dr. Gala Romney   Chief Complaint  Patient presents with  . Cirrhosis    HPI:   Thomas Pratt is a 56 y.o. male presenting today with a history of decompensated cirrhosis and alcoholic hepatitis in October 2017 but quit drinking in September 2017. He has had multiple abdominal paracenteses, total of 10 L removed in November 2017, he had 4 L removed in February during her ED visit (ran out of diuretics at that time) and an additional 10 L removed in March 2018. He had no recurrent ascites between March and June. Last abdominal paracenteses in July with removal of 2.5 L. Colonoscopy up-to-date as of this year, with next in 5 years if health permits. EGD at that time with grade 1 esophageal varices, portal gastropathy. Started on Nadolol 40 mg at that time.   Lasix 40 and aldactone 100 mg daily. Due for hepatoma surveillance in November. No ETOH in 1 year. No confusion, mental status changes. No abdominal pain. Noting bruising on arms. Appetite is good. Sometimes sharp pain in middle of stomach when standing up. Has to walk around a bit and it will go away. Known inguinal hernia.   Heart rate 64 at rest today by my assessment.  71 bpm by nursing staff. No dizziness, hypotension noted.   Past Medical History:  Diagnosis Date  . Cirrhosis ([place])    likely ETOH use   . Hypertension     Past Surgical History:  Procedure Laterality Date  . COLONOSCOPY WITH PROPOFOL N/A 03/06/2017   Procedure: COLONOSCOPY WITH PROPOFOL;  Surgeon: Daneil Dolin, MD;  [facility]: [facility];  Service: Endoscopy;  Laterality: N/A;  12:45pm  . ESOPHAGOGASTRODUODENOSCOPY (EGD) WITH PROPOFOL N/A 03/06/2017   Procedure: ESOPHAGOGASTRODUODENOSCOPY (EGD) WITH PROPOFOL;  Surgeon: Daneil Dolin, MD;  Location: [facility];  Service: Endoscopy;  Laterality: N/A;  . NO PAST SURGERIES    . POLYPECTOMY  03/06/2017   Procedure: POLYPECTOMY;  Surgeon: Daneil Dolin, MD;  Location: [facility];  Service: Endoscopy;;  ascending colon;    Current Outpatient Prescriptions  Medication Sig Dispense Refill  . furosemide (LASIX) 40 MG tablet Take 1 tablet (40 mg total) by mouth daily. 30 tablet 5  . nadolol (CORGARD) 40 MG tablet Take 40 mg by mouth daily.    . pantoprazole (PROTONIX) 40 MG tablet Take 40 mg by mouth daily.    Marland Kitchen spironolactone (ALDACTONE) 100 MG tablet Take 1 tablet (100 mg total) by mouth daily. 30 tablet 5   No current facility-administered medications for this visit.     Allergies as of 04/25/2017  . (No Known Allergies)    Family History  Problem Relation Age of Onset  . Liver disease Neg Hx   . Colon cancer Neg Hx   . Colon polyps Neg Hx     Social History   Social History  . Marital status: Divorced    Spouse name: N/A  . Number of children: N/A  . Years of education: N/A   Occupational History  . unemployed    Social History Main Topics  . Smoking status: Never Smoker  . Smokeless tobacco: Never Used  . Alcohol use No     Comment: Quit September 2017, heavy alcohol abuse previously  . Drug use: No  . Sexual activity: Not Currently   Other Topics Concern  . None   Social History Narrative  . None  Review of Systems: Gen: Denies fever, chills, anorexia. Denies fatigue, weakness, weight loss.  CV: Denies chest pain, palpitations, syncope, peripheral edema, and claudication. Resp: Denies dyspnea at rest, cough, wheezing, coughing up blood, and pleurisy. GI: see HPI  Derm: Denies rash, itching, dry skin Psych: Denies depression, anxiety, memory loss, confusion. No homicidal or suicidal ideation.  Heme: Denies bruising, bleeding, and enlarged lymph nodes.  Physical Exam: BP 130/82   Pulse 71   Temp (!) 96.5 F (35.8 C) (Oral)   Ht 5\' 8"  (1.727 m)   Wt 162 lb 9.6 oz (73.8 kg)   BMI 24.72 kg/m  General:   Alert and oriented. No distress noted. Pleasant and cooperative.  Head:  Normocephalic and  atraumatic. Eyes:  Conjuctiva clear without scleral icterus. Mouth:  Oral mucosa pink and moist. Poor dentition  Abdomen:  +BS, soft, non-tender and non-distended. No rebound or guarding.  Msk:  Symmetrical without gross deformities. Normal posture. Extremities:  Without edema. Neurologic:  Alert and  oriented x4 Psych:  Alert and cooperative. Normal mood and affect.

## 2017-04-25 NOTE — Patient Instructions (Signed)
Please have blood work done today.  We will send a reminder for an ultrasound in November.   Continue Nadolol 40 mg each morning, and take 1/2 tablet (20 mg) each evening. Monitor for dizziness, fatigue. If you feel bad on this, call right away.   We will see you in 4 weeks. Continue all the rest of your medications as already prescribed.

## 2017-04-25 NOTE — Assessment & Plan Note (Signed)
56 year old male with ETOH cirrhosis, abstaining for a year now from all alcohol and applauded on this. Doing well with diuretic regimen of Lasix 40 mg and Aldactone 100 mg daily. No need for para since July 2018. Nadolol 40 mg daily without any concerning side effects; however, he is not at target heart rate today. We will add 20 mg Nadolol each evening, and I discussed signs/symptoms to monitor. Return in 4 weeks for close follow-up. Will check routine labs today and plan on ultrasound in November.

## 2017-04-26 LAB — AFP TUMOR MARKER: AFP, SERUM, TUMOR MARKER: 2.1 ng/mL (ref 0.0–8.3)

## 2017-04-28 ENCOUNTER — Telehealth: Payer: Self-pay | Admitting: Internal Medicine

## 2017-04-28 ENCOUNTER — Telehealth: Payer: Self-pay

## 2017-04-28 NOTE — Telephone Encounter (Signed)
Noted  

## 2017-04-28 NOTE — Telephone Encounter (Signed)
Letter mailed

## 2017-04-28 NOTE — Telephone Encounter (Signed)
RECALL FOR ULTRASOUND 

## 2017-04-28 NOTE — Telephone Encounter (Signed)
T/C from [place] at [facility] lab, saying the CMET was missed when they did the labs. They will call and have him come back in and do the CMET, but he will not be charged for the draw since they overlooked it.

## 2017-04-28 NOTE — Progress Notes (Signed)
NO PCP PER PATIENT °

## 2017-05-02 ENCOUNTER — Other Ambulatory Visit (HOSPITAL_COMMUNITY)
Admission: RE | Admit: 2017-05-02 | Discharge: 2017-05-02 | Disposition: A | Discharge status: Home / Self Care | Payer: Self-pay | Source: Ambulatory Visit | Attending: Gastroenterology | Admitting: Gastroenterology

## 2017-05-02 DIAGNOSIS — K7030 Alcoholic cirrhosis of liver without ascites: Secondary | ICD-10-CM | POA: Insufficient documentation

## 2017-05-02 LAB — COMPREHENSIVE METABOLIC PANEL
ALK PHOS: 193 U/L — AB (ref 38–126)
ALT: 29 U/L (ref 17–63)
ANION GAP: 6 (ref 5–15)
AST: 44 U/L — AB (ref 15–41)
Albumin: 3.4 g/dL — ABNORMAL LOW (ref 3.5–5.0)
BUN: 10 mg/dL (ref 6–20)
CHLORIDE: 101 mmol/L (ref 101–111)
CO2: 29 mmol/L (ref 22–32)
Calcium: 9.5 mg/dL (ref 8.9–10.3)
Creatinine, Ser: 0.67 mg/dL (ref 0.61–1.24)
GFR calc Af Amer: >60 mL/min (ref >60)
GFR calc non Af Amer: >60 mL/min (ref >60)
GLUCOSE: 122 mg/dL — AB (ref 65–99)
POTASSIUM: 3.7 mmol/L (ref 3.5–5.1)
Sodium: 136 mmol/L (ref 135–145)
TOTAL PROTEIN: 9.5 g/dL — AB (ref 6.5–8.1)
Total Bilirubin: 0.8 mg/dL (ref 0.3–1.2)

## 2017-05-05 ENCOUNTER — Other Ambulatory Visit: Payer: Self-pay

## 2017-05-05 DIAGNOSIS — K7460 Unspecified cirrhosis of liver: Secondary | ICD-10-CM

## 2017-05-05 DIAGNOSIS — Z8719 Personal history of other diseases of the digestive system: Secondary | ICD-10-CM

## 2017-05-05 DIAGNOSIS — K7030 Alcoholic cirrhosis of liver without ascites: Secondary | ICD-10-CM

## 2017-05-05 DIAGNOSIS — R188 Other ascites: Principal | ICD-10-CM

## 2017-05-05 NOTE — Progress Notes (Signed)
CMP was added after other labs, as this was not drawn. Therefore, MELD Na is likely not entirely accurate but calculated as 11. I see his Alk phos is trending up. AST improved. I think it would be a good idea to check other serologies, as I don't see that on file. If he is willing, would order AMA, ANA, ASMA, ceruloplasmin, alpha-1 antitrypsin, iron, ferritin, TIBC. Let's go ahead and get the ultrasound now instead of November.

## 2017-05-08 ENCOUNTER — Ambulatory Visit (HOSPITAL_COMMUNITY)
Admission: RE | Admit: 2017-05-08 | Discharge: 2017-05-08 | Disposition: A | Discharge status: Home / Self Care | Payer: Self-pay | Source: Ambulatory Visit | Attending: Gastroenterology | Admitting: Gastroenterology

## 2017-05-08 ENCOUNTER — Other Ambulatory Visit (HOSPITAL_COMMUNITY)
Admission: RE | Admit: 2017-05-08 | Discharge: 2017-05-08 | Disposition: A | Discharge status: Home / Self Care | Payer: Self-pay | Source: Ambulatory Visit | Attending: Gastroenterology | Admitting: Gastroenterology

## 2017-05-08 DIAGNOSIS — R69 Illness, unspecified: Secondary | ICD-10-CM | POA: Insufficient documentation

## 2017-05-08 DIAGNOSIS — R188 Other ascites: Secondary | ICD-10-CM | POA: Insufficient documentation

## 2017-05-08 DIAGNOSIS — K8020 Calculus of gallbladder without cholecystitis without obstruction: Secondary | ICD-10-CM | POA: Insufficient documentation

## 2017-05-08 DIAGNOSIS — K7460 Unspecified cirrhosis of liver: Secondary | ICD-10-CM | POA: Insufficient documentation

## 2017-05-08 DIAGNOSIS — Z8719 Personal history of other diseases of the digestive system: Secondary | ICD-10-CM

## 2017-05-08 LAB — FERRITIN: FERRITIN: 179 ng/mL (ref 24–336)

## 2017-05-08 LAB — IRON AND TIBC
Iron: 103 ug/dL (ref 45–182)
SATURATION RATIOS: 24 % (ref 17.9–39.5)
TIBC: 430 ug/dL (ref 250–450)
UIBC: 327 ug/dL

## 2017-05-09 ENCOUNTER — Ambulatory Visit: Payer: Self-pay | Admitting: Nurse Practitioner

## 2017-05-09 LAB — ANTI-SMOOTH MUSCLE ANTIBODY, IGG: F-Actin IgG: 34 Units — ABNORMAL HIGH (ref 0–19)

## 2017-05-09 LAB — ALPHA-1-ANTITRYPSIN: A1 ANTITRYPSIN SER: 170 mg/dL (ref 90–200)

## 2017-05-09 LAB — CERULOPLASMIN: Ceruloplasmin: 37.6 mg/dL — ABNORMAL HIGH (ref 16.0–31.0)

## 2017-05-09 LAB — MITOCHONDRIAL ANTIBODIES: Mitochondrial M2 Ab, IgG: 43.9 Units — ABNORMAL HIGH (ref 0.0–20.0)

## 2017-05-09 LAB — ANTINUCLEAR ANTIBODIES, IFA: ANTINUCLEAR ANTIBODIES, IFA: NEGATIVE

## 2017-05-19 NOTE — Progress Notes (Signed)
Please let patient know u/s showed no hepatoma. gb wall thickening likely due to ascites. Saw Vicente Males last, did not appear to have symptoms suggestive of gb disease therefore will not plan for HIDA.  Plan for ruq u/s in six months.  Forward to [place] for [employer].

## 2017-05-20 NOTE — Progress Notes (Signed)
ON RECALL  °

## 2017-05-20 NOTE — Progress Notes (Signed)
Keep follow-up Nov 5th. Need to discuss labs.

## 2017-05-22 NOTE — Progress Notes (Signed)
AMA and ASMA are elevated. I will talk with him when he comes on Nov 5th regarding possible liver biopsy to further evaluate.

## 2017-05-26 ENCOUNTER — Ambulatory Visit (INDEPENDENT_AMBULATORY_CARE_PROVIDER_SITE_OTHER): Payer: Self-pay | Admitting: Gastroenterology

## 2017-05-26 ENCOUNTER — Encounter: Payer: Self-pay | Admitting: Gastroenterology

## 2017-05-26 VITALS — BP 108/63 | HR 70 | Temp 97.4°F | Ht 68.0 in | Wt 160.2 lb

## 2017-05-26 DIAGNOSIS — K7030 Alcoholic cirrhosis of liver without ascites: Secondary | ICD-10-CM

## 2017-05-26 NOTE — Progress Notes (Signed)
Referring Provider: No ref. provider found Primary Care Physician:  Patient, No Pcp Per Primary GI: Dr. Gala Romney   Chief Complaint  Patient presents with  . Cirrhosis    Discuss test results    HPI:   Thomas Pratt is a 56 y.o. male presenting today with a history of decompensated cirrhosis and alcoholic hepatitis in October 2017 but quit drinking in September 2017. He has had multiple abdominal paracenteses, total of 10 L removed in November 2017, he had 4 L removed in February during her ED visit (ran out of diuretics at that time) and an additional 10 L removed in March 2018. He had no recurrent ascites between March and June. Last abdominal paracenteses in July with removal of 2.5 L. Colonoscopy up-to-date as of this year, with next in 5 years if health permits. EGD at that time with grade 1 esophageal varices, portal gastropathy. Started on Nadolol 40 mg at that time.   Lasix 40 mg and aldactone 100 mg daily. Added 20 mg of Nadolol to already 40 mg taking daily. US liver with cirrhosis, no HCC. Alk Phos trending up with normal transaminases. Further serologies ordered with positive AMA and ASMA. Occasional itching every 2 weeks. Feet hurt at times. Joints hurt. No abdominal pain. Needs [place] in April 2019.   Past Medical History:  Diagnosis Date  . Cirrhosis ([place])    likely ETOH use   . Hypertension     Past Surgical History:  Procedure Laterality Date  . NO PAST SURGERIES      Current Outpatient Medications  Medication Sig Dispense Refill  . furosemide (LASIX) 40 MG tablet Take 1 tablet (40 mg total) by mouth daily. 30 tablet 5  . nadolol (CORGARD) 40 MG tablet Take 1 tablet (40 mg total) by mouth daily. And 1/2 tablet in evening 60 tablet 3  . pantoprazole (PROTONIX) 40 MG tablet Take 40 mg by mouth daily.    Marland Kitchen spironolactone (ALDACTONE) 100 MG tablet Take 1 tablet (100 mg total) by mouth daily. 30 tablet 5   No current facility-administered medications for this visit.       Allergies as of 05/26/2017  . (No Known Allergies)    Family History  Problem Relation Age of Onset  . Liver disease Neg Hx   . Colon cancer Neg Hx   . Colon polyps Neg Hx     Social History   Socioeconomic History  . Marital status: Divorced    Spouse name: None  . Number of children: None  . Years of education: None  . Highest education level: None  Social Needs  . Financial resource strain: None  . Food insecurity - worry: None  . Food insecurity - inability: None  . Transportation needs - medical: None  . Transportation needs - non-medical: None  Occupational History  . Occupation: unemployed  Tobacco Use  . Smoking status: Never Smoker  . Smokeless tobacco: Never Used  Substance and Sexual Activity  . Alcohol use: No    Comment: Quit September 2017, heavy alcohol abuse previously  . Drug use: No  . Sexual activity: Not Currently  Other Topics Concern  . None  Social History Narrative  . None    Review of Systems: Gen: Denies fever, chills, anorexia. Denies fatigue, weakness, weight loss.  CV: Denies chest pain, palpitations, syncope, peripheral edema, and claudication. Resp: Denies dyspnea at rest, cough, wheezing, coughing up blood, and pleurisy. GI: see HPI  Derm: see HPI  Psych: Denies  depression, anxiety, memory loss, confusion. No homicidal or suicidal ideation.  Heme: Denies bruising, bleeding, and enlarged lymph nodes.  Physical Exam: BP 108/63   Pulse 70   Temp (!) 97.4 F (36.3 C) (Oral)   Ht _0  (1.727 m)   Wt 160 lb 3.2 oz (72.7 kg)   BMI 24.36 kg/m  General:   Alert and oriented. No distress noted. Pleasant and cooperative.  Head:  Normocephalic and atraumatic. Eyes:  Conjuctiva clear without scleral icterus. Mouth:  Oral mucosa pink and moist.  Abdomen:  +BS, soft, non-tender and non-distended. No rebound or guarding.  Msk:  Symmetrical without gross deformities. Normal posture. Extremities:  Without edema. Neurologic:  Alert  and  oriented x4 Psych:  Alert and cooperative. Normal mood and affect.  Lab Results  Component Value Date   ALT 29 05/02/2017   AST 44 (H) 05/02/2017   ALKPHOS 193 (H) 05/02/2017   BILITOT 0.8 05/02/2017   Lab Results  Component Value Date   CREATININE 0.67 05/02/2017   BUN 10 05/02/2017   NA 136 05/02/2017   K 3.7 05/02/2017   CL 101 05/02/2017   CO2 29 05/02/2017   Lab Results  Component Value Date   WBC 8.0 04/25/2017   HGB 12.8 (L) 04/25/2017   HCT 38.0 (L) 04/25/2017   MCV 97.2 04/25/2017   PLT 249 04/25/2017

## 2017-05-26 NOTE — Patient Instructions (Signed)
Continue your current medications.   I will talk with Dr. Gala Romney about any other medications we need to add!  See you in 3 months!

## 2017-06-03 NOTE — Assessment & Plan Note (Signed)
56 year old male with history of cirrhosis secondary to ETOH, abstaining for a year now. Fairly well compensated at this time without need for paracenteses since July 2018. Diuretic regimen continues with Lasix 40 mg and aldactone 100 mg daily. Noted to have increasing alk phos with fairly normal transaminases recently, with further serologies revealing positive AMA and ASMA. Will discuss with Dr. Gala Romney if liver biopsy appropriate in this scenario vs empiric treatment. RUQ ultrasound on file with known cirrhosis. Occasional pruritis is endorsed by patient. Will have him return in 3 months.

## 2017-06-03 NOTE — Progress Notes (Signed)
No pcp per patient 

## 2017-06-04 ENCOUNTER — Ambulatory Visit: Payer: Self-pay | Admitting: Nurse Practitioner

## 2017-06-18 ENCOUNTER — Telehealth: Payer: Self-pay | Admitting: Gastroenterology

## 2017-06-18 NOTE — Telephone Encounter (Signed)
Discussed with Dr. Gala Romney. Recommend liver biopsy due to positive AMA, ASMA. Is patient agreeable to this?

## 2017-06-18 NOTE — Progress Notes (Signed)
Discussed with Dr. Gala Romney. Will proceed with liver biopsy for further evaluation due to +AMA, ASMA.

## 2017-06-19 ENCOUNTER — Other Ambulatory Visit: Payer: Self-pay

## 2017-06-19 DIAGNOSIS — K7460 Unspecified cirrhosis of liver: Secondary | ICD-10-CM

## 2017-06-19 NOTE — Telephone Encounter (Signed)
Pt agreeable to have liver biopsy. Is it ok to enter order for liver biopsy?

## 2017-06-19 NOTE — Telephone Encounter (Signed)
Order entered for US liver biopsy. Called and informed [employer] cabin crew.

## 2017-06-19 NOTE — Telephone Encounter (Signed)
Yes.  That is fine.  Thanks

## 2017-06-25 ENCOUNTER — Other Ambulatory Visit: Payer: Self-pay | Admitting: Student

## 2017-06-25 ENCOUNTER — Other Ambulatory Visit: Payer: Self-pay | Admitting: Radiology

## 2017-06-26 ENCOUNTER — Other Ambulatory Visit: Payer: Self-pay | Admitting: Student

## 2017-06-27 ENCOUNTER — Ambulatory Visit (HOSPITAL_COMMUNITY): Admission: RE | Admit: 2017-06-27 | Payer: Self-pay | Source: Ambulatory Visit

## 2017-07-08 ENCOUNTER — Other Ambulatory Visit: Payer: Self-pay

## 2017-07-08 ENCOUNTER — Telehealth: Payer: Self-pay | Admitting: Internal Medicine

## 2017-07-08 ENCOUNTER — Emergency Department (HOSPITAL_COMMUNITY)
Admission: EM | Admit: 2017-07-08 | Discharge: 2017-07-08 | Disposition: A | Discharge status: Home / Self Care | Payer: Self-pay | Attending: Emergency Medicine | Admitting: Emergency Medicine

## 2017-07-08 ENCOUNTER — Encounter (HOSPITAL_COMMUNITY): Payer: Self-pay | Admitting: Emergency Medicine

## 2017-07-08 DIAGNOSIS — I10 Essential (primary) hypertension: Secondary | ICD-10-CM | POA: Insufficient documentation

## 2017-07-08 DIAGNOSIS — K4090 Unilateral inguinal hernia, without obstruction or gangrene, not specified as recurrent: Secondary | ICD-10-CM | POA: Insufficient documentation

## 2017-07-08 DIAGNOSIS — Z79899 Other long term (current) drug therapy: Secondary | ICD-10-CM | POA: Insufficient documentation

## 2017-07-08 LAB — URINALYSIS, ROUTINE W REFLEX MICROSCOPIC
BACTERIA UA: NONE SEEN
BILIRUBIN URINE: NEGATIVE
Glucose, UA: 50 mg/dL — AB
Hgb urine dipstick: NEGATIVE
KETONES UR: 5 mg/dL — AB
LEUKOCYTES UA: NEGATIVE
Nitrite: NEGATIVE
PROTEIN: NEGATIVE mg/dL
Specific Gravity, Urine: 1.019 (ref 1.005–1.030)
pH: 5 (ref 5.0–8.0)

## 2017-07-08 LAB — COMPREHENSIVE METABOLIC PANEL
ALBUMIN: 4.1 g/dL (ref 3.5–5.0)
ALT: 52 U/L (ref 17–63)
AST: 51 U/L — AB (ref 15–41)
Alkaline Phosphatase: 200 U/L — ABNORMAL HIGH (ref 38–126)
Anion gap: 11 (ref 5–15)
BUN: 15 mg/dL (ref 6–20)
CHLORIDE: 92 mmol/L — AB (ref 101–111)
CO2: 27 mmol/L (ref 22–32)
Calcium: 10.6 mg/dL — ABNORMAL HIGH (ref 8.9–10.3)
Creatinine, Ser: 0.92 mg/dL (ref 0.61–1.24)
GFR calc Af Amer: >60 mL/min (ref >60)
GLUCOSE: 243 mg/dL — AB (ref 65–99)
POTASSIUM: 4.7 mmol/L (ref 3.5–5.1)
Sodium: 130 mmol/L — ABNORMAL LOW (ref 135–145)
Total Bilirubin: 1 mg/dL (ref 0.3–1.2)
Total Protein: 9.7 g/dL — ABNORMAL HIGH (ref 6.5–8.1)

## 2017-07-08 LAB — CBC
HEMATOCRIT: 45.3 % (ref 39.0–52.0)
Hemoglobin: 15.5 g/dL (ref 13.0–17.0)
MCH: 32.5 pg (ref 26.0–34.0)
MCHC: 34.2 g/dL (ref 30.0–36.0)
MCV: 95 fL (ref 78.0–100.0)
Platelets: 283 10*3/uL (ref 150–400)
RBC: 4.77 MIL/uL (ref 4.22–5.81)
RDW: 13.2 % (ref 11.5–15.5)
WBC: 9.9 10*3/uL (ref 4.0–10.5)

## 2017-07-08 LAB — LIPASE, BLOOD: LIPASE: 42 U/L (ref 11–51)

## 2017-07-08 MED ORDER — ONDANSETRON HCL 4 MG/2ML IJ SOLN
4.0000 mg | Freq: Once | INTRAMUSCULAR | Status: AC
  Administered 2017-07-08: 4 mg via INTRAVENOUS

## 2017-07-08 MED ORDER — HYDROMORPHONE HCL 1 MG/ML IJ SOLN
1.0000 mg | Freq: Once | INTRAMUSCULAR | Status: AC
  Administered 2017-07-08: 1 mg via INTRAVENOUS
  Filled 2017-07-08: qty 1

## 2017-07-08 MED ORDER — ONDANSETRON HCL 4 MG/2ML IJ SOLN
INTRAMUSCULAR | Status: AC
  Administered 2017-07-08: 4 mg via INTRAVENOUS
  Filled 2017-07-08: qty 2

## 2017-07-08 NOTE — Discharge Instructions (Signed)
Please stay in a laying down position -  You should not strain, cough or sneeze if possible. Keep pressure on your right groin where the hernia is  If it comes out - put gentle pressure on it until it goes back in ER for increased pain / vomiting or severe or worsening symptoms.

## 2017-07-08 NOTE — Telephone Encounter (Signed)
Pt was put on AB schedule this morning, FYI, pt is headed to the ED

## 2017-07-08 NOTE — Telephone Encounter (Signed)
Noted  

## 2017-07-08 NOTE — ED Notes (Signed)
Pt ambulated per EDP verbal order. As pt was ambulating he c/o right inguinal pain and swelling. Pt returned to bed and EDP notified.

## 2017-07-08 NOTE — Telephone Encounter (Signed)
Spoke with pt and pt said he is on the way to the ED. Pt was in severe abdominal pain and can no longer take it.

## 2017-07-08 NOTE — ED Triage Notes (Addendum)
PT c/o pressure/sharp generalized abdominal pain with nausea that started this am. PT denies any urinary symptoms. PT states normal BM today with hx of alcoholic cirrhosis.

## 2017-07-08 NOTE — Telephone Encounter (Signed)
Pt called to say he has bad pain in his abdomin and doesn't know what to do. I have put him on the schedule to see AB tomorrow at 2pm. He would also like to speak with the nurse. Please call him at [phone]

## 2017-07-08 NOTE — ED Provider Notes (Signed)
[facility] EMERGENCY DEPARTMENT Provider Note   CSN: [id number] Arrival date & time: 07/08/17  1219     History   Chief Complaint Chief Complaint  Patient presents with  . Abdominal Pain    HPI Thomas Pratt is a 56 y.o. male.  HPI  The patient is a 56 year old male who has a history of high blood pressure as well as cirrhosis of the liver secondary to alcohol use, he has not had any alcohol in over 1 year.  He is also known to have a right-sided inguinal hernia which she reports is intermittent, mild and usually goes away when he lays down.  Ports that starting this morning he developed lower abdominal pain as well as epigastric pain with associated nausea and a feeling of intense pain in his right groin.  He notes that he has had a hernia that he cannot get back inside this morning.  He has not tried to push on it but states that it will not go down.  No vomiting, no diarrhea, no dysuria, no fevers or chills and no coughing or shortness of breath or headache.  He has had no pain medication prior to arrival.  Past Medical History:  Diagnosis Date  . Cirrhosis ([place])    likely ETOH use   . Hypertension     Patient Active Problem List   Diagnosis Date Noted  . Inguinal hernia 11/14/2016  . Malnutrition of moderate degree 05/22/2016  . Lower extremity edema   . Hypoalbuminemia 05/21/2016  . Hyponatremia 05/21/2016  . Hypokalemia 05/21/2016  . HTN (hypertension) 05/21/2016  . Ascites 05/21/2016  . ALC (alcoholic liver cirrhosis) ([place]) 05/21/2016    Past Surgical History:  Procedure Laterality Date  . COLONOSCOPY WITH PROPOFOL N/A 03/06/2017   Procedure: COLONOSCOPY WITH PROPOFOL;  Surgeon: Daneil Dolin, MD;  [facility];  Service: Endoscopy;  Laterality: N/A;  12:45pm  . ESOPHAGOGASTRODUODENOSCOPY (EGD) WITH PROPOFOL N/A 03/06/2017   Procedure: ESOPHAGOGASTRODUODENOSCOPY (EGD) WITH PROPOFOL;  Surgeon: Daneil Dolin, MD;  Location: [facility];   Service: Endoscopy;  Laterality: N/A;  . NO PAST SURGERIES    . POLYPECTOMY  03/06/2017   Procedure: POLYPECTOMY;  Surgeon: Daneil Dolin, MD;  Location: [facility];  Service: Endoscopy;;  ascending colon;       Home Medications    Prior to Admission medications   Medication Sig Start Date End Date Taking? Authorizing Provider  furosemide (LASIX) 40 MG tablet Take 1 tablet (40 mg total) by mouth daily. 12/20/16   Carlis Stable, NP  nadolol (CORGARD) 40 MG tablet Take 1 tablet (40 mg total) by mouth daily. And 1/2 tablet in evening Patient taking differently: Take 20-40 mg by mouth See admin instructions. Take 40 mg in the morning And 1/2 tablet (20 mg) in evening 04/25/17   Annitta Needs, NP  pantoprazole (PROTONIX) 40 MG tablet Take 40 mg by mouth daily.    [provider]  spironolactone (ALDACTONE) 100 MG tablet Take 1 tablet (100 mg total) by mouth daily. 12/20/16   Carlis Stable, NP    Family History Family History  Problem Relation Age of Onset  . Liver disease Neg Hx   . Colon cancer Neg Hx   . Colon polyps Neg Hx     Social History Social History   Tobacco Use  . Smoking status: Never Smoker  . Smokeless tobacco: Never Used  Substance Use Topics  . Alcohol use: No  Comment: Quit September 2017, heavy alcohol abuse previously  . Drug use: No     Allergies   Patient has no known allergies.   Review of Systems Review of Systems  All other systems reviewed and are negative.    Physical Exam Updated Vital Signs BP 132/74   Pulse 71   Temp 98 F (36.7 C) (Oral)   Resp 12   Ht 5\' 8"  (1.727 m)   Wt 72.6 kg (160 lb)   SpO2 95%   BMI 24.33 kg/m   Physical Exam  Constitutional: He appears well-developed and well-nourished.  Uncomfortable appearing  HENT:  Head: Normocephalic and atraumatic.  Mouth/Throat: Oropharynx is clear and moist. No oropharyngeal exudate.  Eyes: Conjunctivae and EOM are normal. Pupils are equal, round, and reactive to  light. Right eye exhibits no discharge. Left eye exhibits no discharge. No scleral icterus.  Neck: Normal range of motion. Neck supple. No JVD present. No thyromegaly present.  Cardiovascular: Normal rate, regular rhythm, normal heart sounds and intact distal pulses. Exam reveals no gallop and no friction rub.  No murmur heard. Pulmonary/Chest: Effort normal and breath sounds normal. No respiratory distress. He has no wheezes. He has no rales.  Abdominal: Soft. Bowel sounds are normal. He exhibits no distension and no mass. There is no hepatosplenomegaly. There is tenderness. There is no rebound, no guarding, no CVA tenderness and negative Murphy's sign. A hernia is present. Hernia confirmed positive in the right inguinal area.  Mild tenderness to palpation in the epigastrium, right upper quadrant and right lower quadrant, inguinal hernia noted, large and tender  Genitourinary: Penis normal. Right testis shows mass, swelling and tenderness. Left testis shows no mass, no swelling and no tenderness.  Musculoskeletal: Normal range of motion. He exhibits no edema or tenderness.  Lymphadenopathy:    He has no cervical adenopathy.  Neurological: He is alert. Coordination normal.  Skin: Skin is warm and dry. No rash noted. No erythema.  Psychiatric: He has a normal mood and affect. His behavior is normal.  Nursing note and vitals reviewed.    ED Treatments / Results  Labs (all labs ordered are listed, but only abnormal results are displayed) Labs Reviewed  COMPREHENSIVE METABOLIC PANEL - Abnormal; Notable for the following components:      Result Value   Sodium 130 (*)    Chloride 92 (*)    Glucose, Bld 243 (*)    Calcium 10.6 (*)    Total Protein 9.7 (*)    AST 51 (*)    Alkaline Phosphatase 200 (*)    All other components within normal limits  URINALYSIS, ROUTINE W REFLEX MICROSCOPIC - Abnormal; Notable for the following components:   Glucose, UA 50 (*)    Ketones, ur 5 (*)    Squamous  Epithelial / LPF 0-5 (*)    All other components within normal limits  LIPASE, BLOOD  CBC     Radiology No results found.  Procedures Hernia reduction Date/Time: 07/08/2017 2:30 PM Performed by: Noemi Chapel, MD Authorized by: Noemi Chapel, MD  Consent: Verbal consent obtained. Risks and benefits: risks, benefits and alternatives were discussed Consent given by: patient Patient understanding: patient states understanding of the procedure being performed Patient consent: the patient's understanding of the procedure matches consent given Procedure consent: procedure consent matches procedure scheduled Relevant documents: relevant documents present and verified Test results: test results available and properly labeled Site marked: the operative site was marked Imaging studies: imaging studies available Required items: required  blood products, implants, devices, and special equipment available Patient identity confirmed: verbally with patient Time out: Immediately prior to procedure a "time out" was called to verify the correct patient, procedure, equipment, support staff and site/side marked as required. Preparation: Patient was prepped and draped in the usual sterile fashion. Local anesthesia used: no  Anesthesia: Local anesthesia used: no  Sedation: Patient sedated: no  Patient tolerance: Patient tolerated the procedure well with no immediate complications Comments: Constant pressure with ice and Trendelenburg - successful reduction attempt - approximately 10 minutes of constant pressure.    (including critical care time)  Medications Ordered in ED Medications  HYDROmorphone (DILAUDID) injection 1 mg (1 mg Intravenous Given 07/08/17 1439)  ondansetron (ZOFRAN) injection 4 mg (4 mg Intravenous Given 07/08/17 1447)     Initial Impression / Assessment and Plan / ED Course  I have reviewed the triage vital signs and the nursing notes.  Pertinent labs & imaging  results that were available during my care of the patient were reviewed by me and considered in my medical decision making (see chart for details).     The labs show that the patient has a chronic hyponatremia, he does not have a leukocytosis, his lipase and liver function seem to be close to baseline, no significant anemia.  He has a large hernia which will need to be reduced, he is not vomiting and has no anion gap acidosis.  Will obtain IV access, give pain medication, placed in Trendelenburg with ice and gentle pressure to try to reduce this hernia.  I attempted reduction on arrival without pain medication and the patient did not tolerated very well thus pain medication will be given prior to second attempt.  Dilaudid given,  Pt did well with second attempt with addition of ice for cooling and Trendelenburg positioning.   Successful reduction, pain improved and now resolved, no abd ttp.  Pt stable for d/c with Gen Surgery f/u.  Pt agreeable - precautions given.  I discussed the case with Dr. Arnoldo Morale of general surgery who is willing to see the patient on Thursday.  The patient was informed of this.  He was able to walk around the emergency department and while he was walking he did develop some mild recurrent small hernia.  This was easily reducible with less than 5 seconds of pressure.  The patient will be encouraged to lay supine and rest for 48 hours with instructions not to strain, cough, sneeze and to return should he develop increasing pain vomiting or fevers.  The patient expresses understanding, stable for discharge  Final Clinical Impressions(s) / ED Diagnoses   Final diagnoses:  Non-recurrent unilateral inguinal hernia without obstruction or gangrene    ED Discharge Orders    None       Noemi Chapel, MD 07/08/17 [id number]

## 2017-07-09 ENCOUNTER — Ambulatory Visit: Payer: Self-pay | Admitting: Gastroenterology

## 2017-07-10 ENCOUNTER — Encounter (HOSPITAL_COMMUNITY)
Admission: RE | Admit: 2017-07-10 | Discharge: 2017-07-10 | Disposition: A | Discharge status: Home / Self Care | Payer: Self-pay | Source: Ambulatory Visit | Attending: General Surgery | Admitting: General Surgery

## 2017-07-10 ENCOUNTER — Encounter: Payer: Self-pay | Admitting: General Surgery

## 2017-07-10 ENCOUNTER — Ambulatory Visit (INDEPENDENT_AMBULATORY_CARE_PROVIDER_SITE_OTHER): Payer: Self-pay | Admitting: General Surgery

## 2017-07-10 ENCOUNTER — Telehealth: Payer: Self-pay | Admitting: Internal Medicine

## 2017-07-10 VITALS — BP 153/84 | HR 87 | Temp 98.2°F | Ht 68.0 in | Wt 161.0 lb

## 2017-07-10 DIAGNOSIS — K4090 Unilateral inguinal hernia, without obstruction or gangrene, not specified as recurrent: Secondary | ICD-10-CM

## 2017-07-10 NOTE — Telephone Encounter (Signed)
FYI note, pt wasn't feeling well and couldn't take the stomach pain. Pt went to the ED and called back to let us know Dr.Jenkins is going to operate on him., routing message

## 2017-07-10 NOTE — H&P (Signed)
Thomas Pratt; [id number]; September 23, 1960   HPI Patient is a 56 year old Hispanic male who was referred to my care by the emergency room for evaluation and treatment of a right inguinal hernia.  I last saw him in my office in June 2018.  At that time, he had easily reducible right inguinal hernia.  As he had a history of cirrhosis with paracentesis, we elected to watch this.  Patient states he has had worsening pain in the right groin with the hernia sticking out.  He had to be reduced in the emergency room 2 days ago.  Patient has 8 out of 10 pain.  He states he has not had paracentesis this year.  His cirrhosis is under control.       Past Medical History:  Diagnosis Date  . Cirrhosis ([place])    likely ETOH use   . Hypertension          Past Surgical History:  Procedure Laterality Date  . COLONOSCOPY WITH PROPOFOL N/A 03/06/2017   Procedure: COLONOSCOPY WITH PROPOFOL;  Surgeon: Daneil Dolin, MD;  [facility];  Service: Endoscopy;  Laterality: N/A;  12:45pm  . ESOPHAGOGASTRODUODENOSCOPY (EGD) WITH PROPOFOL N/A 03/06/2017   Procedure: ESOPHAGOGASTRODUODENOSCOPY (EGD) WITH PROPOFOL;  Surgeon: Daneil Dolin, MD;  [facility];  Service: Endoscopy;  Laterality: N/A;  . NO PAST SURGERIES    . POLYPECTOMY  03/06/2017   Procedure: POLYPECTOMY;  Surgeon: Daneil Dolin, MD;  [facility];  Service: Endoscopy;;  ascending colon;         Family History  Problem Relation Age of Onset  . Liver disease Neg Hx   . Colon cancer Neg Hx   . Colon polyps Neg Hx           Current Outpatient Medications on File Prior to Visit  Medication Sig Dispense Refill  . furosemide (LASIX) 40 MG tablet Take 1 tablet (40 mg total) by mouth daily. 30 tablet 5  . nadolol (CORGARD) 40 MG tablet Take 1 tablet (40 mg total) by mouth daily. And 1/2 tablet in evening (Patient taking differently: Take 20-40 mg by mouth See admin instructions. Take 40 mg in the  morning And 1/2 tablet (20 mg) in evening) 60 tablet 3  . pantoprazole (PROTONIX) 40 MG tablet Take 40 mg by mouth daily.    Marland Kitchen spironolactone (ALDACTONE) 100 MG tablet Take 1 tablet (100 mg total) by mouth daily. 30 tablet 5   No current facility-administered medications on file prior to visit.     No Known Allergies  Social History       Substance and Sexual Activity  Alcohol Use No   Comment: Quit September 2017, heavy alcohol abuse previously    Social History      Tobacco Use  Smoking Status Never Smoker  Smokeless Tobacco Never Used    Review of Systems  Constitutional: Negative.   HENT: Negative.   Eyes: Negative.   Respiratory: Negative.   Cardiovascular: Negative.   Gastrointestinal: Positive for abdominal pain and nausea.  Genitourinary: Negative.   Musculoskeletal: Negative.   Skin: Negative.   Neurological: Negative.   Endo/Heme/Allergies: Negative.   Psychiatric/Behavioral: Negative.     Objective      Vitals:   07/10/17 0912  BP: (!) 153/84  Pulse: 87  Temp: 98.2 F (36.8 C)    Physical Exam  Constitutional: He is oriented to person, place, and time and well-developed, well-nourished, and in no distress.  HENT:  Head: Normocephalic and atraumatic.  Cardiovascular: Normal rate, regular rhythm and normal heart sounds. Exam reveals no gallop and no friction rub.  No murmur heard. Pulmonary/Chest: Effort normal and breath sounds normal. No respiratory distress. He has no wheezes. He has no rales.  Abdominal: Soft. Bowel sounds are normal. He exhibits no distension. There is no tenderness. There is no rebound.  No significant ascites present.  Reducible right inguinal hernia present.  Genitourinary:  Genitourinary Comments: Genitourinary examination within normal limits.  Neurological: He is alert and oriented to person, place, and time.  Skin: Skin is warm and dry.  Vitals reviewed.   Assessment  Right inguinal  hernia Plan   Patient is scheduled for right inguinal herniorrhaphy with mesh on 07/11/2017.  The risks and benefits of the procedure including bleeding, infection, mesh use, and the possibility of recurrence of the hernia were fully explained to the patient, who gave informed consent.

## 2017-07-10 NOTE — Progress Notes (Signed)
Thomas Pratt; [id number]; 02-Feb-1961   HPI Patient is a 56 year old Hispanic male who was referred to my care by the emergency room for evaluation and treatment of a right inguinal hernia.  I last saw him in my office in June 2018.  At that time, he had easily reducible right inguinal hernia.  As he had a history of cirrhosis with paracentesis, we elected to watch this.  Patient states he has had worsening pain in the right groin with the hernia sticking out.  He had to be reduced in the emergency room 2 days ago.  Patient has 8 out of 10 pain.  He states he has not had paracentesis this year.  His cirrhosis is under control.   Past Medical History:  Diagnosis Date  . Cirrhosis ([place])    likely ETOH use   . Hypertension     Past Surgical History:  Procedure Laterality Date  . COLONOSCOPY WITH PROPOFOL N/A 03/06/2017   Procedure: COLONOSCOPY WITH PROPOFOL;  Surgeon: Daneil Dolin, MD;  [facility];  Service: Endoscopy;  Laterality: N/A;  12:45pm  . ESOPHAGOGASTRODUODENOSCOPY (EGD) WITH PROPOFOL N/A 03/06/2017   Procedure: ESOPHAGOGASTRODUODENOSCOPY (EGD) WITH PROPOFOL;  Surgeon: Daneil Dolin, MD;  [facility];  Service: Endoscopy;  Laterality: N/A;  . NO PAST SURGERIES    . POLYPECTOMY  03/06/2017   Procedure: POLYPECTOMY;  Surgeon: Daneil Dolin, MD;  [facility];  Service: Endoscopy;;  ascending colon;    Family History  Problem Relation Age of Onset  . Liver disease Neg Hx   . Colon cancer Neg Hx   . Colon polyps Neg Hx     Current Outpatient Medications on File Prior to Visit  Medication Sig Dispense Refill  . furosemide (LASIX) 40 MG tablet Take 1 tablet (40 mg total) by mouth daily. 30 tablet 5  . nadolol (CORGARD) 40 MG tablet Take 1 tablet (40 mg total) by mouth daily. And 1/2 tablet in evening (Patient taking differently: Take 20-40 mg by mouth See admin instructions. Take 40 mg in the morning And 1/2 tablet (20 mg) in evening) 60  tablet 3  . pantoprazole (PROTONIX) 40 MG tablet Take 40 mg by mouth daily.    Marland Kitchen spironolactone (ALDACTONE) 100 MG tablet Take 1 tablet (100 mg total) by mouth daily. 30 tablet 5   No current facility-administered medications on file prior to visit.     No Known Allergies  Social History   Substance and Sexual Activity  Alcohol Use No   Comment: Quit September 2017, heavy alcohol abuse previously    Social History   Tobacco Use  Smoking Status Never Smoker  Smokeless Tobacco Never Used    Review of Systems  Constitutional: Negative.   HENT: Negative.   Eyes: Negative.   Respiratory: Negative.   Cardiovascular: Negative.   Gastrointestinal: Positive for abdominal pain and nausea.  Genitourinary: Negative.   Musculoskeletal: Negative.   Skin: Negative.   Neurological: Negative.   Endo/Heme/Allergies: Negative.   Psychiatric/Behavioral: Negative.     Objective   Vitals:   07/10/17 0912  BP: (!) 153/84  Pulse: 87  Temp: 98.2 F (36.8 C)    Physical Exam  Constitutional: He is oriented to person, place, and time and well-developed, well-nourished, and in no distress.  HENT:  Head: Normocephalic and atraumatic.  Cardiovascular: Normal rate, regular rhythm and normal heart sounds. Exam reveals no gallop and no friction rub.  No murmur heard. Pulmonary/Chest: Effort normal and  breath sounds normal. No respiratory distress. He has no wheezes. He has no rales.  Abdominal: Soft. Bowel sounds are normal. He exhibits no distension. There is no tenderness. There is no rebound.  No significant ascites present.  Reducible right inguinal hernia present.  Genitourinary:  Genitourinary Comments: Genitourinary examination within normal limits.  Neurological: He is alert and oriented to person, place, and time.  Skin: Skin is warm and dry.  Vitals reviewed.   Assessment  Right inguinal hernia Plan   Patient is scheduled for right inguinal herniorrhaphy with mesh on  07/11/2017.  The risks and benefits of the procedure including bleeding, infection, mesh use, and the possibility of recurrence of the hernia were fully explained to the patient, who gave informed consent.

## 2017-07-10 NOTE — Telephone Encounter (Signed)
PATIENT CALLED TO LET us KNOW THAT DR Arnoldo Morale IS GOING TO OPERATE ON HIM TOMORROW

## 2017-07-10 NOTE — Patient Instructions (Signed)

## 2017-07-10 NOTE — OR Nursing (Signed)
Glucose 243 reported to Dr. Arnoldo Morale . Orders to recheck glucose on arrival to preop.

## 2017-07-11 ENCOUNTER — Ambulatory Visit (HOSPITAL_COMMUNITY)
Admission: RE | Admit: 2017-07-11 | Discharge: 2017-07-11 | Disposition: A | Discharge status: Home / Self Care | Payer: Self-pay | Source: Ambulatory Visit | Attending: General Surgery | Admitting: General Surgery

## 2017-07-11 ENCOUNTER — Encounter (HOSPITAL_COMMUNITY)
Admission: RE | Disposition: A | Discharge status: Home / Self Care | Payer: Self-pay | Source: Ambulatory Visit | Attending: General Surgery

## 2017-07-11 ENCOUNTER — Ambulatory Visit (HOSPITAL_COMMUNITY): Payer: Self-pay | Admitting: Anesthesiology

## 2017-07-11 ENCOUNTER — Encounter (HOSPITAL_COMMUNITY): Payer: Self-pay | Admitting: *Deleted

## 2017-07-11 DIAGNOSIS — K4090 Unilateral inguinal hernia, without obstruction or gangrene, not specified as recurrent: Secondary | ICD-10-CM | POA: Insufficient documentation

## 2017-07-11 DIAGNOSIS — N432 Other hydrocele: Secondary | ICD-10-CM | POA: Insufficient documentation

## 2017-07-11 DIAGNOSIS — K7460 Unspecified cirrhosis of liver: Secondary | ICD-10-CM | POA: Insufficient documentation

## 2017-07-11 DIAGNOSIS — I10 Essential (primary) hypertension: Secondary | ICD-10-CM | POA: Insufficient documentation

## 2017-07-11 DIAGNOSIS — R188 Other ascites: Secondary | ICD-10-CM | POA: Insufficient documentation

## 2017-07-11 DIAGNOSIS — Z79899 Other long term (current) drug therapy: Secondary | ICD-10-CM | POA: Insufficient documentation

## 2017-07-11 HISTORY — PX: INGUINAL HERNIA REPAIR: SHX194

## 2017-07-11 LAB — GLUCOSE, CAPILLARY
GLUCOSE-CAPILLARY: 137 mg/dL — AB (ref 65–99)
Glucose-Capillary: 149 mg/dL — ABNORMAL HIGH (ref 65–99)

## 2017-07-11 SURGERY — REPAIR, HERNIA, INGUINAL, ADULT
Anesthesia: General | Site: Groin | Laterality: Right

## 2017-07-11 MED ORDER — MIDAZOLAM HCL 2 MG/2ML IJ SOLN
INTRAMUSCULAR | Status: AC
  Filled 2017-07-11: qty 2

## 2017-07-11 MED ORDER — MIDAZOLAM HCL 2 MG/2ML IJ SOLN
1.0000 mg | INTRAMUSCULAR | Status: AC
  Administered 2017-07-11: 2 mg via INTRAVENOUS

## 2017-07-11 MED ORDER — SODIUM CHLORIDE 0.9 % IR SOLN
Status: DC | PRN
  Administered 2017-07-11: 1000 mL

## 2017-07-11 MED ORDER — PROPOFOL 10 MG/ML IV BOLUS
INTRAVENOUS | Status: AC
  Filled 2017-07-11: qty 20

## 2017-07-11 MED ORDER — BUPIVACAINE LIPOSOME 1.3 % IJ SUSP
INTRAMUSCULAR | Status: AC
  Filled 2017-07-11: qty 20

## 2017-07-11 MED ORDER — ONDANSETRON HCL 4 MG/2ML IJ SOLN
4.0000 mg | Freq: Once | INTRAMUSCULAR | Status: AC
  Administered 2017-07-11: 4 mg via INTRAVENOUS

## 2017-07-11 MED ORDER — KETOROLAC TROMETHAMINE 30 MG/ML IJ SOLN
30.0000 mg | Freq: Once | INTRAMUSCULAR | Status: AC
  Administered 2017-07-11: 30 mg via INTRAVENOUS
  Filled 2017-07-11: qty 1

## 2017-07-11 MED ORDER — SUCCINYLCHOLINE CHLORIDE 20 MG/ML IJ SOLN
INTRAMUSCULAR | Status: AC
Start: 2017-07-11 — End: ?
  Filled 2017-07-11: qty 2

## 2017-07-11 MED ORDER — FENTANYL CITRATE (PF) 100 MCG/2ML IJ SOLN
25.0000 ug | INTRAMUSCULAR | Status: DC | PRN
  Administered 2017-07-11 (×2): 50 ug via INTRAVENOUS
  Filled 2017-07-11: qty 2

## 2017-07-11 MED ORDER — CHLORHEXIDINE GLUCONATE CLOTH 2 % EX PADS: 6.0000 | MEDICATED_PAD | Freq: Once | CUTANEOUS | Status: DC

## 2017-07-11 MED ORDER — SODIUM CHLORIDE 0.9 % IJ SOLN
INTRAMUSCULAR | Status: AC
  Filled 2017-07-11: qty 10

## 2017-07-11 MED ORDER — BUPIVACAINE LIPOSOME 1.3 % IJ SUSP
INTRAMUSCULAR | Status: DC | PRN
  Administered 2017-07-11: 20 mL

## 2017-07-11 MED ORDER — EPHEDRINE SULFATE 50 MG/ML IJ SOLN
INTRAMUSCULAR | Status: AC
  Filled 2017-07-11: qty 1

## 2017-07-11 MED ORDER — OXYCODONE HCL 10 MG PO TABS: 10.0000 mg | ORAL_TABLET | ORAL | 0 refills | Status: DC | PRN

## 2017-07-11 MED ORDER — LIDOCAINE HCL (CARDIAC) 10 MG/ML IV SOLN
INTRAVENOUS | Status: DC | PRN
  Administered 2017-07-11: 40 mg via INTRAVENOUS

## 2017-07-11 MED ORDER — LIDOCAINE HCL (PF) 1 % IJ SOLN
INTRAMUSCULAR | Status: AC
  Filled 2017-07-11: qty 5

## 2017-07-11 MED ORDER — LACTATED RINGERS IV SOLN
INTRAVENOUS | Status: DC
  Administered 2017-07-11: 1000 mL via INTRAVENOUS

## 2017-07-11 MED ORDER — FENTANYL CITRATE (PF) 100 MCG/2ML IJ SOLN
INTRAMUSCULAR | Status: DC | PRN
  Administered 2017-07-11: 50 ug via INTRAVENOUS
  Administered 2017-07-11 (×2): 25 ug via INTRAVENOUS

## 2017-07-11 MED ORDER — CEFAZOLIN SODIUM-DEXTROSE 2-4 GM/100ML-% IV SOLN
2.0000 g | INTRAVENOUS | Status: AC
  Administered 2017-07-11: 2 g via INTRAVENOUS

## 2017-07-11 MED ORDER — PROPOFOL 10 MG/ML IV BOLUS
INTRAVENOUS | Status: DC | PRN
  Administered 2017-07-11: 150 mg via INTRAVENOUS

## 2017-07-11 MED ORDER — EPHEDRINE SULFATE 50 MG/ML IJ SOLN
INTRAMUSCULAR | Status: DC | PRN
  Administered 2017-07-11: 10 mg via INTRAVENOUS

## 2017-07-11 MED ORDER — ARTIFICIAL TEARS OPHTHALMIC OINT
TOPICAL_OINTMENT | OPHTHALMIC | Status: AC
  Filled 2017-07-11: qty 3.5

## 2017-07-11 MED ORDER — ONDANSETRON HCL 4 MG/2ML IJ SOLN
INTRAMUSCULAR | Status: AC
  Filled 2017-07-11: qty 2

## 2017-07-11 MED ORDER — CEFAZOLIN SODIUM-DEXTROSE 1-4 GM/50ML-% IV SOLN
INTRAVENOUS | Status: AC
  Filled 2017-07-11: qty 50

## 2017-07-11 MED ORDER — FENTANYL CITRATE (PF) 250 MCG/5ML IJ SOLN
INTRAMUSCULAR | Status: AC
  Filled 2017-07-11: qty 5

## 2017-07-11 SURGICAL SUPPLY — 38 items
BAG HAMPER (MISCELLANEOUS) ×2 IMPLANT
CLOTH BEACON ORANGE TIMEOUT ST (SAFETY) ×2 IMPLANT
COVER LIGHT HANDLE STERIS (MISCELLANEOUS) ×4 IMPLANT
DERMABOND ADVANCED (GAUZE/BANDAGES/DRESSINGS) ×1
DERMABOND ADVANCED .7 DNX12 (GAUZE/BANDAGES/DRESSINGS) ×1 IMPLANT
DRAIN PENROSE 18X1/2 LTX STRL (DRAIN) ×2 IMPLANT
ELECT REM PT RETURN 9FT ADLT (ELECTROSURGICAL) ×2
ELECTRODE REM PT RTRN 9FT ADLT (ELECTROSURGICAL) ×1 IMPLANT
GAUZE SPONGE 4X4 16PLY XRAY LF (GAUZE/BANDAGES/DRESSINGS) ×2 IMPLANT
GLOVE BIO SURGEON STRL SZ7 (GLOVE) ×4 IMPLANT
GLOVE BIOGEL PI IND STRL 7.0 (GLOVE) ×3 IMPLANT
GLOVE BIOGEL PI INDICATOR 7.0 (GLOVE) ×3
GLOVE SURG SS PI 7.5 STRL IVOR (GLOVE) ×2 IMPLANT
GOWN STRL REUS W/ TWL XL LVL3 (GOWN DISPOSABLE) ×1 IMPLANT
GOWN STRL REUS W/TWL LRG LVL3 (GOWN DISPOSABLE) ×4 IMPLANT
GOWN STRL REUS W/TWL XL LVL3 (GOWN DISPOSABLE) ×1
INST SET MINOR GENERAL (KITS) ×2 IMPLANT
KIT ROOM TURNOVER APOR (KITS) ×2 IMPLANT
MANIFOLD NEPTUNE II (INSTRUMENTS) ×2 IMPLANT
MESH HERNIA 1.6X1.9 PLUG LRG (Mesh General) ×1 IMPLANT
MESH HERNIA PLUG LRG (Mesh General) ×1 IMPLANT
NEEDLE HYPO 21X1.5 SAFETY (NEEDLE) ×2 IMPLANT
NS IRRIG 1000ML POUR BTL (IV SOLUTION) ×2 IMPLANT
PACK MINOR (CUSTOM PROCEDURE TRAY) ×2 IMPLANT
PAD ARMBOARD 7.5X6 YLW CONV (MISCELLANEOUS) ×2 IMPLANT
SET BASIN LINEN APH (SET/KITS/TRAYS/PACK) ×2 IMPLANT
SUT MNCRL AB 4-0 PS2 18 (SUTURE) ×2 IMPLANT
SUT NOVA NAB GS-22 2 2-0 T-19 (SUTURE) ×6 IMPLANT
SUT PROLENE 0 CT 1 30 (SUTURE) ×2 IMPLANT
SUT PROLENE 2 0 SH 30 (SUTURE) IMPLANT
SUT SILK 3 0 (SUTURE)
SUT SILK 3-0 18XBRD TIE 12 (SUTURE) IMPLANT
SUT VIC AB 2-0 CT1 27 (SUTURE) ×1
SUT VIC AB 2-0 CT1 TAPERPNT 27 (SUTURE) ×1 IMPLANT
SUT VIC AB 3-0 SH 27 (SUTURE) ×1
SUT VIC AB 3-0 SH 27X BRD (SUTURE) ×1 IMPLANT
SUT VICRYL AB 3 0 TIES (SUTURE) IMPLANT
SYR 20CC LL (SYRINGE) ×2 IMPLANT

## 2017-07-11 NOTE — Interval H&P Note (Signed)
History and Physical Interval Note:  07/11/2017 9:21 AM  Thomas Pratt  has presented today for surgery, with the diagnosis of right inguinal hernia  The various methods of treatment have been discussed with the patient and family. After consideration of risks, benefits and other options for treatment, the patient has consented to  Procedure(s) with comments: [place] (Right) - patient knows to arrive at 7:30 as a surgical intervention .  The patient's history has been reviewed, patient examined, no change in status, stable for surgery.  I have reviewed the patient's chart and labs.  Questions were answered to the patient's satisfaction.     Aviva Signs

## 2017-07-11 NOTE — Transfer of Care (Signed)
Immediate Anesthesia Transfer of Care Note  Patient: Thomas Pratt  Procedure(s) Performed: HERNIA REPAIR INGUINAL ADULT WITH MESH (Right Groin)  Patient Location: PACU  Anesthesia Type:General  Level of Consciousness: awake, alert , oriented and patient cooperative  Airway & Oxygen Therapy: Patient Spontanous Breathing and Patient connected to face mask oxygen  Post-op Assessment: Report given to RN and Post -op Vital signs reviewed and stable  Post vital signs: Reviewed and stable  Last Vitals:  Vitals:   07/11/17 0900 07/11/17 0915  BP: 111/71 116/70  Resp: 18 (!) 33  SpO2: 97% 97%    Last Pain: There were no vitals filed for this visit.       Complications: No apparent anesthesia complications

## 2017-07-11 NOTE — Anesthesia Procedure Notes (Signed)
Procedure Name: LMA Insertion Date/Time: 07/11/2017 7:34 AM Performed by: Andree Elk, Argenis Kumari A, CRNA Pre-anesthesia Checklist: Patient identified, Timeout performed, Emergency Drugs available, Suction available and Patient being monitored Patient Re-evaluated:Patient Re-evaluated prior to induction Oxygen Delivery Method: Circle system utilized Preoxygenation: Pre-oxygenation with 100% oxygen Induction Type: IV induction Ventilation: Mask ventilation without difficulty LMA: LMA inserted LMA Size: 4.0 Number of attempts: 1 Placement Confirmation: positive ETCO2 and breath sounds checked- equal and bilateral Tube secured with: Tape Dental Injury: Teeth and Oropharynx as per pre-operative assessment

## 2017-07-11 NOTE — Telephone Encounter (Signed)
Thanks for update

## 2017-07-11 NOTE — Anesthesia Preprocedure Evaluation (Signed)
Anesthesia Evaluation  Patient identified by MRN, date of birth, ID band Patient awake    Reviewed: Allergy & Precautions, NPO status , Patient's Chart, lab work & pertinent test results  Airway Mallampati: II  TM Distance: >3 FB Neck ROM: Full    Dental  (+) Poor Dentition, Missing, Chipped, Dental Advisory Given   Pulmonary neg pulmonary ROS,    breath sounds clear to auscultation       Cardiovascular hypertension, Pt. on medications  Rhythm:Regular Rate:Normal     Neuro/Psych negative neurological ROS     GI/Hepatic negative GI ROS, (+) Cirrhosis   ascites  substance abuse  alcohol use,   Endo/Other  negative endocrine ROSdiabetes (untreated - new onset?)  Renal/GU      Musculoskeletal   Abdominal   Peds  Hematology negative hematology ROS (+)   Anesthesia Other Findings   Reproductive/Obstetrics                             Anesthesia Physical Anesthesia Plan  ASA: III  Anesthesia Plan: General   Post-op Pain Management:    Induction: Intravenous  PONV Risk Score and Plan:   Airway Management Planned: LMA  Additional Equipment:   Intra-op Plan:   Post-operative Plan: Extubation in OR  Informed Consent: I have reviewed the patients History and Physical, chart, labs and discussed the procedure including the risks, benefits and alternatives for the proposed anesthesia with the patient or authorized representative who has indicated his/her understanding and acceptance.     Plan Discussed with:   Anesthesia Plan Comments:         Anesthesia Quick Evaluation

## 2017-07-11 NOTE — Anesthesia Postprocedure Evaluation (Signed)
Anesthesia Post Note  Patient: Thomas Pratt  Procedure(s) Performed: HERNIA REPAIR INGUINAL ADULT WITH MESH (Right Groin)  Patient location during evaluation: PACU Anesthesia Type: General Level of consciousness: awake and alert, oriented and patient cooperative Pain management: pain level controlled Vital Signs Assessment: post-procedure vital signs reviewed and stable Respiratory status: spontaneous breathing and respiratory function stable Cardiovascular status: stable Postop Assessment: no apparent nausea or vomiting Anesthetic complications: no     Last Vitals:  Vitals:   07/11/17 0900 07/11/17 0915  BP: 111/71 116/70  Resp: 18 (!) 33  SpO2: 97% 97%    Last Pain: There were no vitals filed for this visit.               Karra Pink A

## 2017-07-11 NOTE — Discharge Instructions (Signed)
Open Hernia Repair, Adult, Care After °These instructions give you information about caring for yourself after your procedure. Your doctor may also give you more specific instructions. If you have problems or questions, contact your doctor. °Follow these instructions at home: °Surgical cut (incision) care ° °· Follow instructions from your doctor about how to take care of your surgical cut area. Make sure you: °? Wash your hands with soap and water before you change your bandage (dressing). If you cannot use soap and water, use hand sanitizer. °? Change your bandage as told by your doctor. °? Leave stitches (sutures), skin glue, or skin tape (adhesive) strips in place. They may need to stay in place for 2 weeks or longer. If tape strips get loose and curl up, you may trim the loose edges. Do not remove tape strips completely unless your doctor says it is okay. °· Check your surgical cut every day for signs of infection. Check for: °? More redness, swelling, or pain. °? More fluid or blood. °? Warmth. °? Pus or a bad smell. °Activity °· Do not drive or use heavy machinery while taking prescription pain medicine. Do not drive until your doctor says it is okay. °· Until your doctor says it is okay: °? Do not lift anything that is heavier than 10 lb (4.5 kg). °? Do not play contact sports. °· Return to your normal activities as told by your doctor. Ask your doctor what activities are safe. °General instructions °· To prevent or treat having a hard time pooping (constipation) while you are taking prescription pain medicine, your doctor may recommend that you: °? Drink enough fluid to keep your pee (urine) clear or pale yellow. °? Take over-the-counter or prescription medicines. °? Eat foods that are high in fiber, such as fresh fruits and vegetables, whole grains, and beans. °? Limit foods that are high in fat and processed sugars, such as fried and sweet foods. °· Take over-the-counter and prescription medicines only as  told by your doctor. °· Do not take baths, swim, or use a hot tub until your doctor says it is okay. °· Keep all follow-up visits as told by your doctor. This is important. °Contact a doctor if: °· You develop a rash. °· You have more redness, swelling, or pain around your surgical cut. °· You have more fluid or blood coming from your surgical cut. °· Your surgical cut feels warm to the touch. °· You have pus or a bad smell coming from your surgical cut. °· You have a fever or chills. °· You have blood in your poop (stool). °· You have not pooped in 2-3 days. °· Medicine does not help your pain. °Get help right away if: °· You have chest pain or you are short of breath. °· You feel light-headed. °· You feel weak and dizzy (feel faint). °· You have very bad pain. °· You throw up (vomit) and your pain is worse. °This information is not intended to replace advice given to you by your health care provider. Make sure you discuss any questions you have with your health care provider. °Document Released: 07/29/2014 Document Revised: 01/26/2016 Document Reviewed: 12/20/2015 °Elsevier Interactive Patient Education © 2018 Elsevier Inc. ° °

## 2017-07-11 NOTE — Op Note (Signed)
Patient:  Thomas Pratt  DOB:  29-Oct-1960  MRN:  [id number]   Preop Diagnosis: Right inguinal hernia  Postop Diagnosis: Same, hydrocele  Procedure: Right inguinal herniorrhaphy with mesh  Surgeon: Aviva Signs, MD  Anes: General  Indications: Patient is a 56 year old Hispanic male who presents with a symptomatic right inguinal hernia.  He does have a history of cirrhosis and mild ascites.  The risks and benefits of the procedure including bleeding, infection, mesh use, and the possibility of recurrence of the hernia were fully explained to the patient, who gave informed consent.  Procedure note: The patient was placed in supine position.  After general anesthesia was administered, the right groin region was prepped and draped using the usual sterile technique with technic care.  Surgical site confirmation was performed.  A transverse incision was made in the right groin region down to the external oblique aponeurosis.  The porosis was incised to the external ring.  A Penrose drain was placed around the spermatic cord.  The vase deferens was noted within the spermatic cord.  The ileal inguinal nerve was identified and retracted inferiorly from the operative field.  The patient had a large right indirect hernia sac with extension into the hydrocele.  This was freed away from the right testicle.  The sac had to be entered into and a high ligation of the sac was performed using an 0 Prolene pursestring suture x2.  The excess hernia sac was excised.  It was disposed of.  The remaining hernia stump was inverted and a large Bard PerFix plug was then placed in this region.  An onlay patch was then placed along the floor of the inguinal canal and secured superiorly to the conjoined tendon and inferiorly to the shelving edge of Poupart's ligament using 2-0 Novafil interrupted sutures.  The external oblique aponeurosis was reapproximated using a 2-0 Vicryl running suture.  Subcutaneous layer was  reapproximated using 3-0 Vicryl interrupted sutures.  Exparel was instilled into the surrounding wound.  The skin was closed using a 4-0 Monocryl subcuticular suture.  Dermabond was applied.  All tape needle counts were correct at the end of the procedure.  The patient was awakened and transferred to PACU in stable condition.  Complications: None  EBL: Minimal  Specimen: None

## 2017-07-17 ENCOUNTER — Encounter (HOSPITAL_COMMUNITY): Payer: Self-pay | Admitting: General Surgery

## 2017-07-17 ENCOUNTER — Other Ambulatory Visit: Payer: Self-pay | Admitting: Radiology

## 2017-07-18 ENCOUNTER — Ambulatory Visit (HOSPITAL_COMMUNITY)
Admission: RE | Admit: 2017-07-18 | Discharge: 2017-07-18 | Disposition: A | Discharge status: Home / Self Care | Payer: Self-pay | Source: Ambulatory Visit | Attending: Gastroenterology | Admitting: Gastroenterology

## 2017-07-18 ENCOUNTER — Encounter (HOSPITAL_COMMUNITY): Payer: Self-pay

## 2017-07-18 ENCOUNTER — Other Ambulatory Visit: Payer: Self-pay | Admitting: Gastroenterology

## 2017-07-18 DIAGNOSIS — R7989 Other specified abnormal findings of blood chemistry: Secondary | ICD-10-CM | POA: Insufficient documentation

## 2017-07-18 DIAGNOSIS — K7460 Unspecified cirrhosis of liver: Secondary | ICD-10-CM | POA: Insufficient documentation

## 2017-07-18 DIAGNOSIS — I10 Essential (primary) hypertension: Secondary | ICD-10-CM | POA: Insufficient documentation

## 2017-07-18 DIAGNOSIS — Z79899 Other long term (current) drug therapy: Secondary | ICD-10-CM | POA: Insufficient documentation

## 2017-07-18 DIAGNOSIS — R188 Other ascites: Secondary | ICD-10-CM | POA: Insufficient documentation

## 2017-07-18 LAB — CBC
HCT: 40 % (ref 39.0–52.0)
HEMOGLOBIN: 13.6 g/dL (ref 13.0–17.0)
MCH: 32.3 pg (ref 26.0–34.0)
MCHC: 34 g/dL (ref 30.0–36.0)
MCV: 95 fL (ref 78.0–100.0)
PLATELETS: 258 10*3/uL (ref 150–400)
RBC: 4.21 MIL/uL — AB (ref 4.22–5.81)
RDW: 13.3 % (ref 11.5–15.5)
WBC: 6.5 10*3/uL (ref 4.0–10.5)

## 2017-07-18 LAB — PROTIME-INR
INR: 1.1
PROTHROMBIN TIME: 14.1 s (ref 11.4–15.2)

## 2017-07-18 LAB — APTT: aPTT: 36 seconds (ref 24–36)

## 2017-07-18 MED ORDER — SODIUM CHLORIDE 0.9 % IV SOLN: INTRAVENOUS | Status: DC

## 2017-07-18 MED ORDER — NALOXONE HCL 0.4 MG/ML IJ SOLN
INTRAMUSCULAR | Status: AC
  Filled 2017-07-18: qty 1

## 2017-07-18 MED ORDER — FLUMAZENIL 1 MG/10ML IV SOLN
INTRAVENOUS | Status: AC
  Filled 2017-07-18: qty 10

## 2017-07-18 MED ORDER — GELATIN ABSORBABLE 12-7 MM EX MISC
CUTANEOUS | Status: AC
  Filled 2017-07-18: qty 1

## 2017-07-18 MED ORDER — MIDAZOLAM HCL 2 MG/2ML IJ SOLN
INTRAMUSCULAR | Status: AC
  Filled 2017-07-18: qty 4

## 2017-07-18 MED ORDER — FENTANYL CITRATE (PF) 100 MCG/2ML IJ SOLN
INTRAMUSCULAR | Status: AC
  Filled 2017-07-18: qty 4

## 2017-07-18 MED ORDER — LIDOCAINE HCL (PF) 1 % IJ SOLN
INTRAMUSCULAR | Status: AC
  Filled 2017-07-18: qty 30

## 2017-07-18 NOTE — Sedation Documentation (Signed)
Per Dr. Earleen Newport procedure will be rescheduled for another day. No sedation medication given. Patient will be discharged from ultrasound.  Roselyn Reef Liberti Appleton,RN

## 2017-07-18 NOTE — H&P (Signed)
Chief Complaint: Patient was seen in consultation today for random liver core biopsy at the request of Annitta Needs  Referring Physician(s): [place] Physician: Corrie Mckusick  Patient Status: [facility] - Out-pt  History of Present Illness: Thomas Pratt is a 56 y.o. male   ETOH cirrhosis (none in 1 yr) Previous paracentesis for ascites Last para July 2018  Elevated liver functions Elevated Alk Phosphatase Pos AMA; ASMA  Scheduled now for liver core biopsy  Past Medical History:  Diagnosis Date  . Cirrhosis ([place])    likely ETOH use   . Hypertension     Past Surgical History:  Procedure Laterality Date  . COLONOSCOPY WITH PROPOFOL N/A 03/06/2017   Procedure: COLONOSCOPY WITH PROPOFOL;  Surgeon: Daneil Dolin, MD;  Location: [facility] END[facility] SU[facility];  Service: Endoscopy;  Laterality: N/A;  12:45pm  . ESOPHAGOGASTRODUODENOSCOPY (EGD) WITH PROPOFOL N/A 03/06/2017   Procedure: ESOPHAGOGASTRODUODENOSCOPY (EGD) WITH PROPOFOL;  Surgeon: Daneil Dolin, MD;  Location: [facility];  Service: Endoscopy;  Laterality: N/A;  . INGUINAL HERNIA REPAIR Right 07/11/2017   Procedure: HERNIA REPAIR INGUINAL ADULT WITH MESH;  Surgeon: Aviva Signs, MD;  Location: [facility] ORS;  Service: General;  Laterality: Right;  patient knows to arrive at 7:30  . NO PAST SURGERIES    . POLYPECTOMY  03/06/2017   Procedure: POLYPECTOMY;  Surgeon: Daneil Dolin, MD;  Location: [facility];  Service: Endoscopy;;  ascending colon;    Allergies: Patient has no known allergies.  Medications: Prior to Admission medications   Medication Sig Start Date End Date Taking? Authorizing Provider  furosemide (LASIX) 40 MG tablet Take 1 tablet (40 mg total) by mouth daily. 12/20/16  Yes Carlis Stable, NP  nadolol (CORGARD) 40 MG tablet Take 1 tablet (40 mg total) by mouth daily. And 1/2 tablet in evening Patient taking differently: Take 20-40 mg by mouth See admin instructions. Take 40 mg in the morning And  1/2 tablet (20 mg) in evening 04/25/17  Yes Annitta Needs, NP  Oxycodone HCl 10 MG TABS Take 1 tablet (10 mg total) by mouth every 4 (four) hours as needed. 07/11/17 07/11/18 Yes Aviva Signs, MD  pantoprazole (PROTONIX) 40 MG tablet Take 40 mg by mouth daily.   Yes [provider]  spironolactone (ALDACTONE) 100 MG tablet Take 1 tablet (100 mg total) by mouth daily. 12/20/16  Yes Carlis Stable, NP     Family History  Problem Relation Age of Onset  . Liver disease Neg Hx   . Colon cancer Neg Hx   . Colon polyps Neg Hx     Social History   Socioeconomic History  . Marital status: Divorced    Spouse name: None  . Number of children: None  . Years of education: None  . Highest education level: None  Social Needs  . Financial resource strain: None  . Food insecurity - worry: None  . Food insecurity - inability: None  . Transportation needs - medical: None  . Transportation needs - non-medical: None  Occupational History  . Occupation: unemployed  Tobacco Use  . Smoking status: Never Smoker  . Smokeless tobacco: Never Used  Substance and Sexual Activity  . Alcohol use: No    Comment: Quit September 2017, heavy alcohol abuse previously  . Drug use: No  . Sexual activity: Not Currently  Other Topics Concern  . None  Social History Narrative  . None    Review of Systems: A 12 point ROS  discussed and pertinent positives are indicated in the HPI above.  All other systems are negative.  Review of Systems  Constitutional: Negative for activity change, fatigue and fever.  Respiratory: Negative for cough and shortness of breath.   Gastrointestinal: Negative for abdominal distention and abdominal pain.  Musculoskeletal: Negative for gait problem.  Neurological: Negative for weakness.  Psychiatric/Behavioral: Negative for behavioral problems and confusion.    Vital Signs: BP 90/68 (BP Location: Right Arm)   Pulse 69   Temp 98 F (36.7 C) (Oral)   Ht _0  (1.727 m)    Wt 160 lb (72.6 kg)   SpO2 97%   BMI 24.33 kg/m   Physical Exam  Constitutional: He is oriented to person, place, and time.  Cardiovascular: Normal rate, regular rhythm and normal heart sounds.  Pulmonary/Chest: Effort normal and breath sounds normal.  Abdominal: Soft. Bowel sounds are normal.  Musculoskeletal: Normal range of motion.  Neurological: He is alert and oriented to person, place, and time.  Skin: Skin is warm and dry.  Psychiatric: He has a normal mood and affect. His behavior is normal. Judgment and thought content normal.  Nursing note and vitals reviewed.   Imaging: No results found.  Labs:  CBC: Recent Labs    02/07/17 1352 04/25/17 1046 07/08/17 1254 07/18/17 1111  WBC 7.3 8.0 9.9 6.5  HGB 13.2 12.8* 15.5 13.6  HCT 38.3* 38.0* 45.3 40.0  PLT 264 249 283 258    COAGS: Recent Labs    11/22/16 1231 02/07/17 1352 04/25/17 1046 07/18/17 1111  INR 1.15 1.10 1.09 1.10  APTT  --   --   --  36    BMP: Recent Labs    11/22/16 1231 02/07/17 1352 05/02/17 1031 07/08/17 1254  NA 133* 132* 136 130*  K 4.2 4.1 3.7 4.7  CL 100* 99* 101 92*  CO2 _1 GLUCOSE 110* 105* 122* 243*  BUN _2 CALCIUM 9.8 9.8 9.5 10.6*  CREATININE 0.84 0.74 0.67 0.92  GFRNONAA >60 >60 >60 >60  GFRAA >60 >60 >60 >60    LIVER FUNCTION TESTS: Recent Labs    11/22/16 1231 02/07/17 1352 05/02/17 1031 07/08/17 1254  BILITOT 1.0 0.9 0.8 1.0  AST 38 54* 44* 51*  ALT 18 40 29 52  ALKPHOS 114 161* 193* 200*  PROT 8.9* 9.6* 9.5* 9.7*  ALBUMIN 3.3* 3.7 3.4* 4.1    TUMOR MARKERS: Recent Labs    11/22/16 1231  AFPTM 2.2    Assessment and Plan:  Cirrhosis Elevated liver functions Scheduled for liver core biopsy per GI MD Risks and benefits discussed with the patient including, but not limited to bleeding, infection, damage to adjacent structures or low yield requiring additional tests. All of the patient's questions were answered, patient is  agreeable to proceed. Consent signed and in chart.   Thank you for this interesting consult.  I greatly enjoyed meeting Lydon Vansickle and look forward to participating in their care.  A copy of this report was sent to the requesting provider on this date.  Electronically Signed: Lavonia Drafts, PA-C 07/18/2017, 12:21 PM   I spent a total of  30 Minutes   in face to face in clinical consultation, greater than 50% of which was counseling/coordinating care for liver core biopsy

## 2017-07-18 NOTE — Progress Notes (Signed)
Interventional Radiology Procedure Note  Procedure: Pt referred for percutaneous US guided medical liver biopsy.   On [place], he has significant ascites of the upper abdomen.  This increases bleeding risks, and we will defer for scheduling transjugular liver biopsy.  He understands.     Complications: None  Recommendations:  We will reschedule for transjugular liver biopsy for medical liver biopsy to decrease bleeding risk.   Signed,  Dulcy Fanny. Earleen Newport, DO

## 2017-07-18 NOTE — Discharge Instructions (Signed)
Liver Biopsy, Care After °These instructions give you information on caring for yourself after your procedure. Your doctor may also give you more specific instructions. Call your doctor if you have any problems or questions after your procedure. °Follow these instructions at home: °· Rest at home for 1-2 days or as told by your doctor. °· Have someone stay with you for at least 24 hours. °· Do not do these things in the first 24 hours: °? Drive. °? Use machinery. °? Take care of other people. °? Sign legal documents. °? Take a bath or shower. °· There are many different ways to close and cover a cut (incision). For example, a cut can be closed with stitches, skin glue, or adhesive strips. Follow your doctor's instructions on: °? Taking care of your cut. °? Changing and removing your bandage (dressing). °? Removing whatever was used to close your cut. °· Do not drink alcohol in the first week. °· Do not lift more than 5 pounds or play contact sports for the first 2 weeks. °· Take medicines only as told by your doctor. For 1 week, do not take medicine that has aspirin in it or medicines like ibuprofen. °· Get your test results. °Contact a doctor if: °· A cut bleeds and leaves more than just a small spot of blood. °· A cut is red, puffs up (swells), or hurts more than before. °· Fluid or something else comes from a cut. °· A cut smells bad. °· You have a fever or chills. °Get help right away if: °· You have swelling, bloating, or pain in your belly (abdomen). °· You get dizzy or faint. °· You have a rash. °· You feel sick to your stomach (nauseous) or throw up (vomit). °· You have trouble breathing, feel short of breath, or feel faint. °· Your chest hurts. °· You have problems talking or seeing. °· You have trouble balancing or moving your arms or legs. °This information is not intended to replace advice given to you by your health care provider. Make sure you discuss any questions you have with your health care  provider. °Document Released: 04/16/2008 Document Revised: 12/14/2015 Document Reviewed: 09/03/2013 °Elsevier Interactive Patient Education © 2018 Elsevier Inc. ° ° ° ° °Moderate Conscious Sedation, Adult, Care After °These instructions provide you with information about caring for yourself after your procedure. Your health care provider may also give you more specific instructions. Your treatment has been planned according to current medical practices, but problems sometimes occur. Call your health care provider if you have any problems or questions after your procedure. °What can I expect after the procedure? °After your procedure, it is common: °· To feel sleepy for several hours. °· To feel clumsy and have poor balance for several hours. °· To have poor judgment for several hours. °· To vomit if you eat too soon. ° °Follow these instructions at home: °For at least 24 hours after the procedure: ° °· Do not: °? Participate in activities where you could fall or become injured. °? Drive. °? Use heavy machinery. °? Drink alcohol. °? Take sleeping pills or medicines that cause drowsiness. °? Make important decisions or sign legal documents. °? Take care of children on your own. °· Rest. °Eating and drinking °· Follow the diet recommended by your health care provider. °· If you vomit: °? Drink water, juice, or soup when you can drink without vomiting. °? Make sure you have little or no nausea before eating solid foods. °General instructions °·   Have a responsible adult stay with you until you are awake and alert. °· Take over-the-counter and prescription medicines only as told by your health care provider. °· If you smoke, do not smoke without supervision. °· Keep all follow-up visits as told by your health care provider. This is important. °Contact a health care provider if: °· You keep feeling nauseous or you keep vomiting. °· You feel light-headed. °· You develop a rash. °· You have a fever. °Get help right away  if: °· You have trouble breathing. °This information is not intended to replace advice given to you by your health care provider. Make sure you discuss any questions you have with your health care provider. °Document Released: 04/28/2013 Document Revised: 12/11/2015 Document Reviewed: 10/28/2015 °Elsevier Interactive Patient Education © 2018 Elsevier Inc. ° ° °

## 2017-07-21 ENCOUNTER — Telehealth: Payer: Self-pay | Admitting: Internal Medicine

## 2017-07-21 ENCOUNTER — Other Ambulatory Visit: Payer: Self-pay | Admitting: Gastroenterology

## 2017-07-21 DIAGNOSIS — K7460 Unspecified cirrhosis of liver: Secondary | ICD-10-CM

## 2017-07-21 NOTE — Telephone Encounter (Signed)
Yes, he had ascites present and they were unable to do percutaneous liver biopsy. They will be arranging a transjugular approach.   Is he taking diuretics as he should?  What dosing? Also, needs to strictly follow a 2 gram sodium diet. If he is not scheduled to be seen here in next week or two, he needs an appt.

## 2017-07-21 NOTE — Telephone Encounter (Signed)
Pt called to leave a message for AB. He said he went to see the liver doctor on Friday that we referred him too and the doctor would be getting in touch with AB. I told him that I would let AB be aware. [phone]

## 2017-07-21 NOTE — Telephone Encounter (Signed)
Spoke with pt and he continues to do the 40mg  of lasix daily and aldactone 100mg . Discussed continuing a 2 gm sodium diet.  Pt is scheduled for his procedure next week 07/29/17. Pt said he would like to complete this procedure first and let you get the results before he moves his appointment up with you. Pts next appointment in our office is 08/26/2017.

## 2017-07-23 NOTE — Telephone Encounter (Signed)
Noted, pt will follow up if needed.

## 2017-07-23 NOTE — Telephone Encounter (Signed)
Noted. If he notes any abdominal distension, lower extremity edema, call right away.

## 2017-07-24 ENCOUNTER — Encounter: Payer: Self-pay | Admitting: General Surgery

## 2017-07-24 ENCOUNTER — Ambulatory Visit (INDEPENDENT_AMBULATORY_CARE_PROVIDER_SITE_OTHER): Payer: Self-pay | Admitting: General Surgery

## 2017-07-24 VITALS — BP 146/67 | HR 73 | Temp 98.6°F | Ht 68.0 in | Wt 169.0 lb

## 2017-07-24 DIAGNOSIS — Z09 Encounter for follow-up examination after completed treatment for conditions other than malignant neoplasm: Secondary | ICD-10-CM

## 2017-07-24 NOTE — Progress Notes (Signed)
Subjective:     Thomas Pratt  Status post right inguinal herniorrhaphy with mesh.  Doing well.  Has mild incisional pain.  Is pleased with results. Objective:    BP (!) 146/67   Pulse 73   Temp 98.6 F (37 C)   Ht 5\' 8"  (1.727 m)   Wt 169 lb (76.7 kg)   BMI 25.70 kg/m   General:  alert, cooperative and no distress  Right inguinal incision healing well.  No testicular swelling noted.     Assessment:    Doing well postoperatively.    Plan:   Increase activity as able.  Follow-up here as needed.

## 2017-07-28 ENCOUNTER — Encounter (HOSPITAL_COMMUNITY): Payer: Self-pay

## 2017-07-28 ENCOUNTER — Emergency Department (HOSPITAL_COMMUNITY): Payer: Medicaid Other

## 2017-07-28 ENCOUNTER — Emergency Department (HOSPITAL_COMMUNITY)
Admission: EM | Admit: 2017-07-28 | Discharge: 2017-07-28 | Disposition: A | Discharge status: Home / Self Care | Payer: Medicaid Other | Attending: Emergency Medicine | Admitting: Emergency Medicine

## 2017-07-28 ENCOUNTER — Telehealth: Payer: Self-pay | Admitting: Internal Medicine

## 2017-07-28 DIAGNOSIS — R1031 Right lower quadrant pain: Secondary | ICD-10-CM | POA: Insufficient documentation

## 2017-07-28 DIAGNOSIS — Z79899 Other long term (current) drug therapy: Secondary | ICD-10-CM | POA: Diagnosis not present

## 2017-07-28 DIAGNOSIS — I10 Essential (primary) hypertension: Secondary | ICD-10-CM | POA: Diagnosis not present

## 2017-07-28 DIAGNOSIS — M722 Plantar fascial fibromatosis: Secondary | ICD-10-CM | POA: Insufficient documentation

## 2017-07-28 LAB — COMPREHENSIVE METABOLIC PANEL
ALT: 26 U/L (ref 17–63)
ANION GAP: 10 (ref 5–15)
AST: 44 U/L — AB (ref 15–41)
Albumin: 3.6 g/dL (ref 3.5–5.0)
Alkaline Phosphatase: 160 U/L — ABNORMAL HIGH (ref 38–126)
BILIRUBIN TOTAL: 0.7 mg/dL (ref 0.3–1.2)
BUN: 7 mg/dL (ref 6–20)
CO2: 25 mmol/L (ref 22–32)
Calcium: 9.6 mg/dL (ref 8.9–10.3)
Chloride: 101 mmol/L (ref 101–111)
Creatinine, Ser: 0.81 mg/dL (ref 0.61–1.24)
Glucose, Bld: 122 mg/dL — ABNORMAL HIGH (ref 65–99)
POTASSIUM: 3.5 mmol/L (ref 3.5–5.1)
Sodium: 136 mmol/L (ref 135–145)
TOTAL PROTEIN: 8.6 g/dL — AB (ref 6.5–8.1)

## 2017-07-28 LAB — URINALYSIS, ROUTINE W REFLEX MICROSCOPIC
Bilirubin Urine: NEGATIVE
Glucose, UA: NEGATIVE mg/dL
Hgb urine dipstick: NEGATIVE
KETONES UR: NEGATIVE mg/dL
LEUKOCYTES UA: NEGATIVE
NITRITE: NEGATIVE
PH: 5 (ref 5.0–8.0)
Protein, ur: NEGATIVE mg/dL
Specific Gravity, Urine: 1.006 (ref 1.005–1.030)

## 2017-07-28 LAB — CBC WITH DIFFERENTIAL/PLATELET
BASOS ABS: 0 10*3/uL (ref 0.0–0.1)
BASOS PCT: 0 %
Eosinophils Absolute: 0.1 10*3/uL (ref 0.0–0.7)
Eosinophils Relative: 1 %
HEMATOCRIT: 40.9 % (ref 39.0–52.0)
Hemoglobin: 13.8 g/dL (ref 13.0–17.0)
Lymphocytes Relative: 27 %
Lymphs Abs: 1.9 10*3/uL (ref 0.7–4.0)
MCH: 32.2 pg (ref 26.0–34.0)
MCHC: 33.7 g/dL (ref 30.0–36.0)
MCV: 95.6 fL (ref 78.0–100.0)
MONO ABS: 0.4 10*3/uL (ref 0.1–1.0)
Monocytes Relative: 5 %
NEUTROS ABS: 4.7 10*3/uL (ref 1.7–7.7)
Neutrophils Relative %: 67 %
Platelets: 308 10*3/uL (ref 150–400)
RBC: 4.28 MIL/uL (ref 4.22–5.81)
RDW: 13.5 % (ref 11.5–15.5)
WBC: 7 10*3/uL (ref 4.0–10.5)

## 2017-07-28 LAB — LIPASE, BLOOD: LIPASE: 37 U/L (ref 11–51)

## 2017-07-28 MED ORDER — MORPHINE SULFATE (PF) 4 MG/ML IV SOLN
4.0000 mg | Freq: Once | INTRAVENOUS | Status: AC
  Administered 2017-07-28: 4 mg via INTRAVENOUS
  Filled 2017-07-28: qty 1

## 2017-07-28 MED ORDER — GI COCKTAIL ~~LOC~~
30.0000 mL | Freq: Once | ORAL | Status: AC
  Administered 2017-07-28: 30 mL via ORAL
  Filled 2017-07-28: qty 30

## 2017-07-28 MED ORDER — IOPAMIDOL (ISOVUE-300) INJECTION 61%
100.0000 mL | Freq: Once | INTRAVENOUS | Status: AC | PRN
  Administered 2017-07-28: 100 mL via INTRAVENOUS

## 2017-07-28 NOTE — Discharge Instructions (Signed)
Call Dr. Works office tomorrow to schedule follow-up appointment.  Your CT scan did not show any emergent causes of your abdominal pain today.  You can take 600 mg of ibuprofen with food every 6 hours as needed for pain control.  You can also apply over-the-counter creams, such as capsaicin cream, directly on the skin of the belly to areas that are sore.  This can be purchased at places such as [facility], [facility], or [facility].  Please call the number on your discharge paperwork to get established with a primary care provider.  The pain in your feet sounds like fasciitis.  You can try doing stretches, which are attached along with his paperwork, taking ibuprofen as directed above, or applying ice for 15-20 minutes up to 3-4 times a day.  Some people get relief from this pain by looking at athletic or shoes that support your arch or orthopedic shoes.  Abdominal (belly) pain can be caused by many things. Your caregiver performed an examination and possibly ordered blood/urine tests and imaging (CT scan, x-rays, ultrasound). Many cases can be observed and treated at home after initial evaluation in the emergency department. Even though you are being discharged home, abdominal pain can be unpredictable. Therefore, you need a repeated exam if your pain does not resolve, returns, or worsens. Most patients with abdominal pain don't have to be admitted to the hospital or have surgery, but serious problems like appendicitis and gallbladder attacks can start out as nonspecific pain. Many abdominal conditions cannot be diagnosed in one visit, so follow-up evaluations are very important.  SEEK IMMEDIATE MEDICAL ATTENTION IF: The pain does not go away or becomes severe.  A temperature above 101 develops.  Repeated vomiting occurs (multiple episodes).  The pain becomes localized to portions of the abdomen. The right side could possibly be appendicitis. In an adult, the left lower portion of the abdomen could be colitis or  diverticulitis.  Blood is being passed in stools or vomit (bright red or black tarry stools).  Return also if you develop chest pain, difficulty breathing, dizziness or fainting, or become confused, poorly responsive, or inconsolable (young children).

## 2017-07-28 NOTE — Telephone Encounter (Signed)
Noted, pt notified. Routing message

## 2017-07-28 NOTE — Telephone Encounter (Signed)
PLEASE CALL PATIENT [phone] HE STATES THAT HE IS VERY SICK AND CAN NOT HARDLY WALK

## 2017-07-28 NOTE — ED Triage Notes (Signed)
Pt reports that he had hernia sx 3 weeks ago. Reports entire body aches and "feel like bones want to Break"

## 2017-07-28 NOTE — Telephone Encounter (Signed)
Lmom, waiting on a return call.  

## 2017-07-28 NOTE — Telephone Encounter (Signed)
We can postpone the liver biopsy. Please tell him not to worry about that. The pressing issue is his severe abdominal pain in setting of known cirrhosis. He needs to present to the ED due to severe pain. Multiple differentials for this, and unfortunately, I can't do anything as outpatient. Needs ED evaluation.

## 2017-07-28 NOTE — ED Provider Notes (Signed)
[facility] EMERGENCY DEPARTMENT Provider Note   CSN: [id number] Arrival date & time: 07/28/17  1148     History   Chief Complaint Chief Complaint  Patient presents with  . Generalized Body Aches    HPI Thomas Pratt is a 58 y.o. male with a history of cirrhosis, HTN, and right inguinal hernia repair who presents to the emergency department with multiple complaints.  The patient reports constant, sharp right lower quadrant abdominal pain that intermittent radiates to the periumbilical and epigastric area as sharp pain that began suddenly last night between 9-10 PM.  He denies fever, chills, nausea, vomiting, diarrhea, constipation, dysuria, penile or testicular pain or swelling.  He had surgery on 07/11/2017 for a right inguinal hernia repair.  He followed up with Dr. Arnoldo Morale on 07/24/17.  He denies pain or discharge around the surgical site.  He also reports a history of chronic bilateral foot pain. He states "it feels like the bones in my feet are breaking." He states the chronic, tingling pain is located on the dorsum of the mid-foot and heel and yesterday the pain suddenly began intermittently radiating up the bilateral, posterior bilateral calves.  No recent trauma or injury.   He treated his pain with his last tablet of pain medication left over from his surgery with minimal improvement. Surgical hx includes hernia repair.  He does not have a PCP.  He is followed by Dr. Gala Romney for GI and was scheduled tomorrow for a liver biopsy.  The history is provided by the patient. No language interpreter was used.    Past Medical History:  Diagnosis Date  . Cirrhosis ([place])    likely ETOH use   . Hypertension     Patient Active Problem List   Diagnosis Date Noted  . Inguinal hernia 11/14/2016  . Malnutrition of moderate degree 05/22/2016  . Lower extremity edema   . Hypoalbuminemia 05/21/2016  . Hyponatremia 05/21/2016  . Hypokalemia 05/21/2016  . HTN (hypertension) 05/21/2016   . Ascites 05/21/2016  . ALC (alcoholic liver cirrhosis) ([place]) 05/21/2016    Past Surgical History:  Procedure Laterality Date  . COLONOSCOPY WITH PROPOFOL N/A 03/06/2017   Procedure: COLONOSCOPY WITH PROPOFOL;  Surgeon: Daneil Dolin, MD;  Location: [facility] ENDO SUITE;  Service: Endoscopy;  Laterality: N/A;  12:45pm  . ESOPHAGOGASTRODUODENOSCOPY (EGD) WITH PROPOFOL N/A 03/06/2017   Procedure: ESOPHAGOGASTRODUODENOSCOPY (EGD) WITH PROPOFOL;  Surgeon: Daneil Dolin, MD;  Location: [facility] ENDO SUITE;  Service: Endoscopy;  Laterality: N/A;  . HERNIA REPAIR    . INGUINAL HERNIA REPAIR Right 07/11/2017   Procedure: HERNIA REPAIR INGUINAL ADULT WITH MESH;  Surgeon: Aviva Signs, MD;  Location: [facility] ORS;  Service: General;  Laterality: Right;  patient knows to arrive at 7:30  . NO PAST SURGERIES    . POLYPECTOMY  03/06/2017   Procedure: POLYPECTOMY;  Surgeon: Daneil Dolin, MD;  Location: [facility] ENDO SUITE;  Service: Endoscopy;;  ascending colon;       Home Medications    Prior to Admission medications   Medication Sig Start Date End Date Taking? Authorizing Provider  furosemide (LASIX) 40 MG tablet Take 1 tablet (40 mg total) by mouth daily. 12/20/16   Carlis Stable, NP  nadolol (CORGARD) 40 MG tablet Take 1 tablet (40 mg total) by mouth daily. And 1/2 tablet in evening Patient taking differently: Take 20-40 mg by mouth See admin instructions. Take 40 mg in the morning And 1/2 tablet (20 mg) in evening 04/25/17   Roseanne Kaufman  W, NP  Oxycodone HCl 10 MG TABS Take 1 tablet (10 mg total) by mouth every 4 (four) hours as needed. 07/11/17 07/11/18  Aviva Signs, MD  pantoprazole (PROTONIX) 40 MG tablet Take 40 mg by mouth daily.    [provider]  spironolactone (ALDACTONE) 100 MG tablet Take 1 tablet (100 mg total) by mouth daily. 12/20/16   Carlis Stable, NP    Family History Family History  Problem Relation Age of Onset  . Liver disease Neg Hx   . Colon cancer Neg Hx   . Colon polyps Neg Hx       Social History Social History   Tobacco Use  . Smoking status: Never Smoker  . Smokeless tobacco: Never Used  Substance Use Topics  . Alcohol use: No    Comment: Quit September 2017, heavy alcohol abuse previously  . Drug use: No     Allergies   Patient has no known allergies.   Review of Systems Review of Systems  Constitutional: Negative for activity change, chills and fever.  HENT: Negative for congestion.   Respiratory: Negative for shortness of breath.   Cardiovascular: Negative for chest pain.  Gastrointestinal: Positive for abdominal pain. Negative for constipation, diarrhea, nausea and vomiting.  Genitourinary: Negative for dysuria, penile pain, penile swelling, scrotal swelling, testicular pain and urgency.  Musculoskeletal: Positive for arthralgias, gait problem and myalgias. Negative for back pain, neck pain and neck stiffness.  Skin: Negative for pallor and rash.  Neurological: Negative for dizziness, weakness, numbness and headaches.  Psychiatric/Behavioral: Negative for confusion.     Physical Exam Updated Vital Signs BP 124/74 (BP Location: Right Arm)   Pulse 75   Temp 98.2 F (36.8 C) (Oral)   Resp 16   SpO2 100%   Physical Exam  Constitutional: He appears well-developed.  HENT:  Head: Normocephalic.  Eyes: Conjunctivae are normal.  Neck: Neck supple.  Cardiovascular: Normal rate, regular rhythm, normal heart sounds and intact distal pulses. Exam reveals no gallop and no friction rub.  No murmur heard. Pulmonary/Chest: Effort normal and breath sounds normal. No stridor. No respiratory distress. He has no wheezes. He has no rales. He exhibits no tenderness.  Abdominal: Soft. He exhibits no distension and no mass. There is tenderness. There is no rebound. No hernia.  Severe tenderness to palpation with mild guarding in the right lower quadrant.  He is tender over McBurney's point.  Moderate tenderness to palpation in the right upper quadrant.   Negative Murphy signs. Left lower quadrant and right upper quadrants are non-tender.  No rebound tenderness.  No Tinel signs.  No CVA tenderness bilaterally.  Negative Rovsing sign. +psoas sign.    Small umbilical hernia noted that is reducible.  Musculoskeletal:  Tender to palpation over the dorsal aspect of the bilateral mid-foot and bilateral heels.  Increased pain with dorsiflexion bilaterally.  DP and PT pulses are 2+ and symmetric.  Sensation is intact throughout.  Able to independently move all digits of the bilateral feet.  Achilles tendon is intact bilaterally.  Tender to palpation with palpation of the gastro-anemias bilaterally.  5 out of 5 strength against resistance with dorsiflexion and plantar flexion.  Full active and passive range of motion of the bilateral ankles, knees, and hips.  Neurological: He is alert.  Antalgic gait.  Able to bear weight on the bilateral lower extremities.  Skin: Skin is warm and dry. Capillary refill takes less than 2 seconds.  Psychiatric: His behavior is normal.  Nursing note  and vitals reviewed.    ED Treatments / Results  Labs (all labs ordered are listed, but only abnormal results are displayed) Labs Reviewed  COMPREHENSIVE METABOLIC PANEL - Abnormal; Notable for the following components:      Result Value   Glucose, Bld 122 (*)    Total Protein 8.6 (*)    AST 44 (*)    Alkaline Phosphatase 160 (*)    All other components within normal limits  URINALYSIS, ROUTINE W REFLEX MICROSCOPIC - Abnormal; Notable for the following components:   Color, Urine STRAW (*)    All other components within normal limits  LIPASE, BLOOD  CBC WITH DIFFERENTIAL/PLATELET    EKG  EKG Interpretation None       Radiology Ct Abdomen Pelvis W Contrast  Result Date: 07/28/2017 CLINICAL DATA:  Abdominal pain.  History of hernia repair. EXAM: CT ABDOMEN AND PELVIS WITH CONTRAST TECHNIQUE: Multidetector CT imaging of the abdomen and pelvis was performed using  the standard protocol following bolus administration of intravenous contrast. CONTRAST:  175mL ISOVUE-300 IOPAMIDOL (ISOVUE-300) INJECTION 61% COMPARISON:  05/21/2016 FINDINGS: Lower chest: Symmetric gynecomastia. Mild scarring or atelectasis over the elevated right diaphragm. Hepatobiliary: History of cirrhosis with heterogeneous parenchyma. No noted mass lesion. Moderate non loculated ascites throughout the abdomen.Cholelithiasis. No evidence of cholecystitis. Normal common bile duct diameter. Pancreas: Unremarkable. Spleen: Unremarkable. Adrenals/Urinary Tract: Negative adrenals. No hydronephrosis or stone. Bilateral renal cortical scarring, more extensive on the left. Unremarkable bladder. Stomach/Bowel:  No obstruction. No appendicitis. Vascular/Lymphatic: Scattered atherosclerotic calcifications. Portal systemic shunt via the right gonadal vein. No acute vascular finding. Reproductive:No pathologic findings. Other: Fluid and thickening along the right inguinal canal and upper spermatic cord correlating with recent inguinal hernia repair. No recurrent hernia is seen. Musculoskeletal: No acute abnormalities. IMPRESSION: 1. No acute finding.  No appendicitis. 2. Cirrhosis with chronic moderate ascites. 3. Cholelithiasis. 4. Changes of recent right inguinal hernia repair. Electronically Signed   By: Monte Fantasia M.D.   On: 07/28/2017 19:31    Procedures Procedures (including critical care time)  Medications Ordered in ED Medications  gi cocktail (Maalox,Lidocaine,Donnatal) (not administered)  morphine 4 MG/ML injection 4 mg (4 mg Intravenous Given 07/28/17 1755)  iopamidol (ISOVUE-300) 61 % injection 100 mL (100 mLs Intravenous Contrast Given 07/28/17 1901)     Initial Impression / Assessment and Plan / ED Course  I have reviewed the triage vital signs and the nursing notes.  Pertinent labs & imaging results that were available during my care of the patient were reviewed by me and considered in my  medical decision making (see chart for details).     Patient is nontoxic, nonseptic appearing, in no apparent distress.  Patient's pain and other symptoms adequately managed in emergency department. Labs, imaging and vitals reviewed. CT A/P with no acute findings, demonstrating cirrhosis and chronic moderate ascites. Patient does not meet the SIRS or Sepsis criteria.  On repeat exam patient does not have a surgical abdomen and there are no peritoneal signs.  No indication of SBP, appendicitis, bowel obstruction, bowel perforation, cholecystitis, or diverticulitis.    The patient also presents with complaints of bilateral chronic foot pain, worse since yesterday without trauma or injury.  On exam he is tender over the bilateral dorsum of the feet, worse over the heel and midfoot.  Suspect plantar fasciitis Patient discharged home with symptomatic treatment and given strict instructions for follow-up with their GI.  I have also discussed reasons to return immediately to the ER.  Patient  expresses understanding and agrees with plan.  Final Clinical Impressions(s) / ED Diagnoses   Final diagnoses:  Right lower quadrant abdominal pain  Plantar fasciitis, bilateral    ED Discharge Orders    None       Joanne Gavel, PA-C 07/28/17 1956    Milton Ferguson, MD 07/28/17 2340

## 2017-07-28 NOTE — Telephone Encounter (Signed)
Spoke with pt and he is having severe abdominal pain. Pt states pain started around 10 pm last night. Pain kept pt awake. Pt said sfter he had his hernia repair, he was doing well with the help of pain meds. Pt is currently our of his pain meds. Pt reports no nausea, no diarrhea but severe abdomen pain. Pt is scheduled tomorrow for a procedure and isn't sure what he should do. Please advise.

## 2017-07-28 NOTE — Telephone Encounter (Signed)
Called IR and informed Thomas Pratt to cancel liver biopsy.

## 2017-07-29 ENCOUNTER — Ambulatory Visit (HOSPITAL_COMMUNITY): Admission: RE | Admit: 2017-07-29 | Payer: Self-pay | Source: Ambulatory Visit

## 2017-07-31 ENCOUNTER — Encounter: Payer: Self-pay | Admitting: Gastroenterology

## 2017-07-31 ENCOUNTER — Ambulatory Visit (INDEPENDENT_AMBULATORY_CARE_PROVIDER_SITE_OTHER): Payer: Medicaid Other | Admitting: Gastroenterology

## 2017-07-31 DIAGNOSIS — R1013 Epigastric pain: Secondary | ICD-10-CM | POA: Insufficient documentation

## 2017-07-31 DIAGNOSIS — R1011 Right upper quadrant pain: Secondary | ICD-10-CM

## 2017-07-31 NOTE — Patient Instructions (Signed)
I want to do a special test for your gallbladder.   However, if you develop a fever, severe abdominal pain, abdominal swelling, vomiting, please go directly back to the emergency room. I don't feel that the fluid in your belly is infected. However, we need to monitor for any changes. It seems that your gallbladder may be an issue.

## 2017-07-31 NOTE — Progress Notes (Signed)
Referring Provider: No ref. provider found Primary Care Physician:  Patient, No Pcp Per Primary GI: Dr. Gala Romney   Chief Complaint  Patient presents with  . Cirrhosis    f/u  . Abdominal Pain    all over abd    HPI:   Thomas Pratt is a 57 y.o. male presenting today with a history of decompensated cirrhosis and alcoholic hepatitis in October 2017 but quit drinking in September 2017. He has had multiple abdominal paracenteses, total of 10 L removed in November 2017, he had 4 L removed in February during her ED visit (ran out of diuretics at that time) and an additional 10 L removed in March 2018. He had no recurrent ascites between March and June. Last abdominal paracenteses in July with removal of 2.5 L.Colonoscopy up-to-date as of last year, with next in 5 years if health permits. EGD at that time with grade 1 esophageal varices, portal gastropathy. Started on Nadolol 40 mg at that time. Plans for transjugular liver biopsy for positive AMA and ASMA, but this was cancelled due to abdominal pain prompting visit to ED on 07/28/17. At that time, he was noting sharp RLQ pain. History significant for right inguinal hernia repair 07/11/17.   CT 1/7 in ED without acute findings, no evidence of appendicitis. He had chronic moderate ascites. He tells me that since last summer, he has had a nagging right-sided/epigastric discomfort, now worsening. He notes persistent pain, associated nausea. Sunday night started worsening from baseline. Feels tight in upper abdomen, no vomiting, no GERD. Associated nausea, early satiety. No melena, hematochezia, continues to abstain from alcohol. No fever, no chills. No improvement after burping.   US abdomen in Oct 2018 with cholelithiasis, borderline gallbladder wall thickening, moderate generalized ascites. Recent CBC without leukocytosis. Vitals stable. LFTs overal same from baseline.   Past Medical History:  Diagnosis Date  . Cirrhosis ([place])    likely ETOH  use   . Hypertension     Past Surgical History:  Procedure Laterality Date  . COLONOSCOPY WITH PROPOFOL N/A 03/06/2017   Procedure: COLONOSCOPY WITH PROPOFOL;  Surgeon: Daneil Dolin, MD;  Location: [facility];  Service: Endoscopy;  Laterality: N/A;  12:45pm  . ESOPHAGOGASTRODUODENOSCOPY (EGD) WITH PROPOFOL N/A 03/06/2017   Procedure: ESOPHAGOGASTRODUODENOSCOPY (EGD) WITH PROPOFOL;  Surgeon: Daneil Dolin, MD;  Location: [facility];  Service: Endoscopy;  Laterality: N/A;  . HERNIA REPAIR    . INGUINAL HERNIA REPAIR Right 07/11/2017   Procedure: HERNIA REPAIR INGUINAL ADULT WITH MESH;  Surgeon: Aviva Signs, MD;  Location: [facility] ORS;  Service: General;  Laterality: Right;  patient knows to arrive at 7:30  . NO PAST SURGERIES    . POLYPECTOMY  03/06/2017   Procedure: POLYPECTOMY;  Surgeon: Daneil Dolin, MD;  Location: [facility];  Service: Endoscopy;;  ascending colon;    Current Outpatient Medications  Medication Sig Dispense Refill  . furosemide (LASIX) 40 MG tablet Take 1 tablet (40 mg total) by mouth daily. 30 tablet 5  . nadolol (CORGARD) 40 MG tablet Take 1 tablet (40 mg total) by mouth daily. And 1/2 tablet in evening (Patient taking differently: Take 20-40 mg by mouth See admin instructions. Take 40 mg in the morning And 1/2 tablet (20 mg) in evening) 60 tablet 3  . Oxycodone HCl 10 MG TABS Take 1 tablet (10 mg total) by mouth every 4 (four) hours as needed. 40 tablet 0  . pantoprazole (PROTONIX) 40 MG tablet Take 40 mg  by mouth daily.    Marland Kitchen spironolactone (ALDACTONE) 100 MG tablet Take 1 tablet (100 mg total) by mouth daily. (Patient not taking: Reported on 07/31/2017) 30 tablet 5   No current facility-administered medications for this visit.     Allergies as of 07/31/2017  . (No Known Allergies)    Family History  Problem Relation Age of Onset  . Liver disease Neg Hx   . Colon cancer Neg Hx   . Colon polyps Neg Hx     Social History   Socioeconomic History    . Marital status: Divorced    Spouse name: None  . Number of children: None  . Years of education: None  . Highest education level: None  Social Needs  . Financial resource strain: None  . Food insecurity - worry: None  . Food insecurity - inability: None  . Transportation needs - medical: None  . Transportation needs - non-medical: None  Occupational History  . Occupation: unemployed  Tobacco Use  . Smoking status: Never Smoker  . Smokeless tobacco: Never Used  Substance and Sexual Activity  . Alcohol use: No    Comment: Quit September 2017, heavy alcohol abuse previously  . Drug use: No  . Sexual activity: Not Currently  Other Topics Concern  . None  Social History Narrative  . None    Review of Systems: Gen: Denies fever, chills, anorexia. Denies fatigue, weakness, weight loss.  CV: Denies chest pain, palpitations, syncope, peripheral edema, and claudication. Resp: Denies dyspnea at rest, cough, wheezing, coughing up blood, and pleurisy. GI: see HPI  Derm: Denies rash, itching, dry skin Psych: Denies depression, anxiety, memory loss, confusion. No homicidal or suicidal ideation.  Heme: Denies bruising, bleeding, and enlarged lymph nodes.  Physical Exam: BP 139/72   Pulse 92   Temp 97.8 F (36.6 C) (Oral)   Ht 5\' 8"  (1.727 m)   Wt 161 lb 3.2 oz (73.1 kg)   BMI 24.51 kg/m  General:   Alert and oriented. No distress noted. Pleasant and cooperative.  Head:  Normocephalic and atraumatic. Eyes:  Conjuctiva clear without scleral icterus. Mouth:  Oral mucosa pink and moist.  Abdomen:  +BS, soft, no evidence of tense ascites, TTP RUQ/Epigastric and non-distended. No rebound or guarding. No HSM or masses noted. Msk:  Symmetrical without gross deformities. Normal posture. Extremities:  Without edema. Neurologic:  Alert and  oriented x4 Psych:  Alert and cooperative. Normal mood and affect.   Lab Results  Component Value Date   WBC 7.0 07/28/2017   HGB 13.8  07/28/2017   HCT 40.9 07/28/2017   MCV 95.6 07/28/2017   PLT 308 07/28/2017   Lab Results  Component Value Date   ALT 26 07/28/2017   AST 44 (H) 07/28/2017   ALKPHOS 160 (H) 07/28/2017   BILITOT 0.7 07/28/2017

## 2017-08-01 ENCOUNTER — Ambulatory Visit (HOSPITAL_COMMUNITY)
Admission: RE | Admit: 2017-08-01 | Discharge: 2017-08-01 | Disposition: A | Discharge status: Home / Self Care | Payer: Medicaid Other | Source: Ambulatory Visit | Attending: Gastroenterology | Admitting: Gastroenterology

## 2017-08-01 DIAGNOSIS — R1011 Right upper quadrant pain: Secondary | ICD-10-CM

## 2017-08-01 MED ORDER — TECHNETIUM TC 99M MEBROFENIN IV KIT
5.0000 | PACK | Freq: Once | INTRAVENOUS | Status: AC | PRN
  Administered 2017-08-01: 5 via INTRAVENOUS

## 2017-08-01 NOTE — Assessment & Plan Note (Signed)
57 year old male with several day history of acute on chronic abdominal pain in epigastric/RUQ, associated nausea and early satiety. No overt GI bleeding. History pertinent for presume ETOH cirrhosis, with prior paracenteses as noted in HPI. Last para actually in July of last year. Afebrile, CBC normal in ED, LFTs similar to baseline. Does not appear acutely ill at time of visit, vitals stable. He does have evidence of moderate ascites on prior ultrasound in Oct and CT recently. Abdomen is non-tense, and he is notably TTP epigastric/RUQ. Known gallstones on imaging previously. I highly doubt dealing with SBP, as he reports underlying chronic dyspepsia since last summer, now worsening over past few days. EGD is recently on file, and he is on a PPI. I have ordered a HIDA scan for further evaluation of biliary etiology. He was instructed to present to the ED if any worsening, associated fever, chills, N/V. As of note, he still needs a liver biopsy in the near future due to positive AMA and ASMA. Further recommendations to follow.

## 2017-08-04 ENCOUNTER — Other Ambulatory Visit: Payer: Self-pay

## 2017-08-04 DIAGNOSIS — K7030 Alcoholic cirrhosis of liver without ascites: Secondary | ICD-10-CM

## 2017-08-04 DIAGNOSIS — R1011 Right upper quadrant pain: Secondary | ICD-10-CM

## 2017-08-04 NOTE — Progress Notes (Signed)
cc'ed to pcp °

## 2017-08-04 NOTE — Progress Notes (Signed)
Low normal EF at 38% on HIDA, and he noted pain with HIDA. Patent cystic and CBD. In order to make sure we have evaluated everything, I recommend a paracentesis with fluid analysis. I do not suspect SBP, but I do wonder if possible increased ascites has caused discomfort. To be thorough, please arrange appt with Dr. Arnoldo Morale due to cholelithiasis, HIDA scan with low normal EF and reproduction of pain with HIDA. Dr. Arnoldo Morale is familiar with him due to recent hernia repair. Please arrange a paracentesis first of this week with cell count, cytology, cultures. 25 grams IV Albumin at 4 liters.

## 2017-08-06 ENCOUNTER — Ambulatory Visit (HOSPITAL_COMMUNITY)
Admission: RE | Admit: 2017-08-06 | Discharge: 2017-08-06 | Disposition: A | Discharge status: Home / Self Care | Payer: Medicaid Other | Source: Ambulatory Visit | Attending: Gastroenterology | Admitting: Gastroenterology

## 2017-08-06 ENCOUNTER — Other Ambulatory Visit: Payer: Self-pay | Admitting: Gastroenterology

## 2017-08-06 ENCOUNTER — Encounter (HOSPITAL_COMMUNITY): Payer: Self-pay

## 2017-08-06 DIAGNOSIS — K7030 Alcoholic cirrhosis of liver without ascites: Secondary | ICD-10-CM

## 2017-08-06 DIAGNOSIS — R188 Other ascites: Secondary | ICD-10-CM | POA: Insufficient documentation

## 2017-08-06 NOTE — Progress Notes (Signed)
Insufficient amount of fluid for paracentesis. How is patient?

## 2017-08-06 NOTE — Progress Notes (Signed)
Patient ID: Thomas Pratt, male   DOB: [birth year], 57 y.o.   MRN: [id number]   Scheduled for paracentesis today  Limited Abd US shows minimal amount ascites Floating bowel in fluid No safe window to access fluid  No paracentesis performed

## 2017-08-08 NOTE — Progress Notes (Signed)
He is certainly not letting me down! I am sorry he is uncomfortable. Have him follow a bland, low-fat diet. Please let me know if pain worsens, he has fever, chills, N/V.

## 2017-08-14 ENCOUNTER — Other Ambulatory Visit: Payer: Self-pay

## 2017-08-14 ENCOUNTER — Encounter (HOSPITAL_COMMUNITY): Payer: Self-pay | Admitting: Emergency Medicine

## 2017-08-14 ENCOUNTER — Emergency Department (HOSPITAL_COMMUNITY)
Admission: EM | Admit: 2017-08-14 | Discharge: 2017-08-14 | Disposition: A | Discharge status: Home / Self Care | Payer: Medicaid Other | Attending: Emergency Medicine | Admitting: Emergency Medicine

## 2017-08-14 ENCOUNTER — Telehealth: Payer: Self-pay

## 2017-08-14 ENCOUNTER — Other Ambulatory Visit (HOSPITAL_COMMUNITY)
Admission: RE | Admit: 2017-08-14 | Discharge: 2017-08-14 | Disposition: A | Discharge status: Home / Self Care | Payer: Medicaid Other | Source: Ambulatory Visit | Attending: Gastroenterology | Admitting: Gastroenterology

## 2017-08-14 DIAGNOSIS — R739 Hyperglycemia, unspecified: Secondary | ICD-10-CM | POA: Insufficient documentation

## 2017-08-14 DIAGNOSIS — R1013 Epigastric pain: Secondary | ICD-10-CM

## 2017-08-14 DIAGNOSIS — I10 Essential (primary) hypertension: Secondary | ICD-10-CM | POA: Diagnosis not present

## 2017-08-14 LAB — CBC WITH DIFFERENTIAL/PLATELET
BASOS ABS: 0 10*3/uL (ref 0.0–0.1)
BASOS PCT: 0 %
Basophils Absolute: 0 10*3/uL (ref 0.0–0.1)
Basophils Relative: 0 %
EOS ABS: 0.1 10*3/uL (ref 0.0–0.7)
EOS PCT: 1 %
Eosinophils Absolute: 0.1 10*3/uL (ref 0.0–0.7)
Eosinophils Relative: 1 %
HCT: 44.2 % (ref 39.0–52.0)
HEMATOCRIT: 42.6 % (ref 39.0–52.0)
HEMOGLOBIN: 14.8 g/dL (ref 13.0–17.0)
HEMOGLOBIN: 14.3 g/dL (ref 13.0–17.0)
LYMPHS PCT: 28 %
Lymphocytes Relative: 32 %
Lymphs Abs: 2.2 10*3/uL (ref 0.7–4.0)
Lymphs Abs: 3.1 10*3/uL (ref 0.7–4.0)
MCH: 32.3 pg (ref 26.0–34.0)
MCH: 32.3 pg (ref 26.0–34.0)
MCHC: 33.5 g/dL (ref 30.0–36.0)
MCHC: 33.6 g/dL (ref 30.0–36.0)
MCV: 96.5 fL (ref 78.0–100.0)
MCV: 96.2 fL (ref 78.0–100.0)
MONOS PCT: 5 %
MONOS PCT: 6 %
Monocytes Absolute: 0.4 10*3/uL (ref 0.1–1.0)
Monocytes Absolute: 0.6 10*3/uL (ref 0.1–1.0)
NEUTROS ABS: 5.9 10*3/uL (ref 1.7–7.7)
NEUTROS PCT: 66 %
NEUTROS PCT: 61 %
Neutro Abs: 5.2 10*3/uL (ref 1.7–7.7)
Platelets: 216 10*3/uL (ref 150–400)
Platelets: 240 10*3/uL (ref 150–400)
RBC: 4.58 MIL/uL (ref 4.22–5.81)
RBC: 4.43 MIL/uL (ref 4.22–5.81)
RDW: 13.6 % (ref 11.5–15.5)
RDW: 13.5 % (ref 11.5–15.5)
WBC: 7.8 10*3/uL (ref 4.0–10.5)
WBC: 9.6 10*3/uL (ref 4.0–10.5)

## 2017-08-14 LAB — COMPREHENSIVE METABOLIC PANEL
ALBUMIN: 4 g/dL (ref 3.5–5.0)
ALK PHOS: 164 U/L — AB (ref 38–126)
ALT: 49 U/L (ref 17–63)
ALT: 49 U/L (ref 17–63)
ANION GAP: 14 (ref 5–15)
ANION GAP: 12 (ref 5–15)
AST: 54 U/L — ABNORMAL HIGH (ref 15–41)
AST: 49 U/L — AB (ref 15–41)
Albumin: 3.9 g/dL (ref 3.5–5.0)
Alkaline Phosphatase: 181 U/L — ABNORMAL HIGH (ref 38–126)
BILIRUBIN TOTAL: 0.9 mg/dL (ref 0.3–1.2)
BUN: 11 mg/dL (ref 6–20)
BUN: 11 mg/dL (ref 6–20)
CALCIUM: 9.6 mg/dL (ref 8.9–10.3)
CO2: 23 mmol/L (ref 22–32)
CO2: 26 mmol/L (ref 22–32)
CREATININE: 0.93 mg/dL (ref 0.61–1.24)
Calcium: 10 mg/dL (ref 8.9–10.3)
Chloride: 93 mmol/L — ABNORMAL LOW (ref 101–111)
Chloride: 97 mmol/L — ABNORMAL LOW (ref 101–111)
Creatinine, Ser: 0.88 mg/dL (ref 0.61–1.24)
GFR calc Af Amer: >60 mL/min (ref >60)
GFR calc non Af Amer: >60 mL/min (ref >60)
GLUCOSE: 128 mg/dL — AB (ref 65–99)
Glucose, Bld: 326 mg/dL — ABNORMAL HIGH (ref 65–99)
POTASSIUM: 4.4 mmol/L (ref 3.5–5.1)
Potassium: 4.1 mmol/L (ref 3.5–5.1)
SODIUM: 130 mmol/L — AB (ref 135–145)
SODIUM: 135 mmol/L (ref 135–145)
TOTAL PROTEIN: 9.3 g/dL — AB (ref 6.5–8.1)
TOTAL PROTEIN: 9.4 g/dL — AB (ref 6.5–8.1)
Total Bilirubin: 1 mg/dL (ref 0.3–1.2)

## 2017-08-14 LAB — URINALYSIS, ROUTINE W REFLEX MICROSCOPIC
Bilirubin Urine: NEGATIVE
GLUCOSE, UA: NEGATIVE mg/dL
Hgb urine dipstick: NEGATIVE
Ketones, ur: NEGATIVE mg/dL
LEUKOCYTES UA: NEGATIVE
Nitrite: NEGATIVE
PH: 7 (ref 5.0–8.0)
Protein, ur: NEGATIVE mg/dL
SPECIFIC GRAVITY, URINE: 1.004 — AB (ref 1.005–1.030)

## 2017-08-14 LAB — CBG MONITORING, ED
Glucose-Capillary: 153 mg/dL — ABNORMAL HIGH (ref 65–99)
Glucose-Capillary: 116 mg/dL — ABNORMAL HIGH (ref 65–99)

## 2017-08-14 MED ORDER — METFORMIN HCL 500 MG PO TABS: 500.0000 mg | ORAL_TABLET | Freq: Two times a day (BID) | ORAL | 0 refills | Status: DC

## 2017-08-14 NOTE — ED Triage Notes (Signed)
PT states he had blood work drawn this am and was told to come to ED for hyperglycemia >300 today with no history of diabetes. PT denies any N/V/D. PT c/o generalized body aches and weakness over the past 3 weeks. CBG in triage 153.

## 2017-08-14 NOTE — Telephone Encounter (Signed)
We need stat CBC, CMP,lipase. If he is feeling worse with light-headed, any fever/chills, vomiting, worsened abdominal pain, do not hesitate to go to ED.

## 2017-08-14 NOTE — Telephone Encounter (Signed)
Spoke with pt, hes heading to get lab work. Orders placed and faxed to [facility]

## 2017-08-14 NOTE — Progress Notes (Signed)
Patient's blood sugar is markedly elevated in the 300 range at time of lab draw. With his symptoms of feeling light-headed and no prior history of diabetes (not formally diagnosed), needs to seek medical attention now to ensure not evolving into DKA.  I worry that it is likely spiking up and he needs this addressed. Will need to establish care with a PCP after this acute issue is taken care of.

## 2017-08-14 NOTE — ED Provider Notes (Signed)
Emergency Department Provider Note   I have reviewed the triage vital signs and the nursing notes.   HISTORY  Chief Complaint Abnormal Lab   HPI Thomas Pratt is a 57 y.o. male with PMH of cirrhosis, EtOH, and HTN presents to the ED for evaluation of hyperglycemia found at his GI appointment today. No history of DM. BS at GI was reportedly 300 this afternoon. Patient denies any polyuria but has had some abdominal discomfort, nausea, and fatigue. Following with GI for abdominal pain and cirrhosis. No CP or SOB. No fever/chills. No radiation of symptoms or modifying factors.    Past Medical History:  Diagnosis Date  . Cirrhosis ([place])    likely ETOH use   . Hypertension     Patient Active Problem List   Diagnosis Date Noted  . Dyspepsia 07/31/2017  . Inguinal hernia 11/14/2016  . Malnutrition of moderate degree 05/22/2016  . Lower extremity edema   . Hypoalbuminemia 05/21/2016  . Hyponatremia 05/21/2016  . Hypokalemia 05/21/2016  . HTN (hypertension) 05/21/2016  . Ascites 05/21/2016  . ALC (alcoholic liver cirrhosis) ([place]) 05/21/2016    Past Surgical History:  Procedure Laterality Date  . COLONOSCOPY WITH PROPOFOL N/A 03/06/2017   Procedure: COLONOSCOPY WITH PROPOFOL;  Surgeon: Daneil Dolin, MD;  [facility];  Service: Endoscopy;  Laterality: N/A;  12:45pm  . ESOPHAGOGASTRODUODENOSCOPY (EGD) WITH PROPOFOL N/A 03/06/2017   Procedure: ESOPHAGOGASTRODUODENOSCOPY (EGD) WITH PROPOFOL;  Surgeon: Daneil Dolin, MD;  [facility];  Service: Endoscopy;  Laterality: N/A;  . HERNIA REPAIR    . INGUINAL HERNIA REPAIR Right 07/11/2017   Procedure: HERNIA REPAIR INGUINAL ADULT WITH MESH;  Surgeon: Aviva Signs, MD;  Location: [facility];  Service: General;  Laterality: Right;  patient knows to arrive at 7:30  . NO PAST SURGERIES    . POLYPECTOMY  03/06/2017   Procedure: POLYPECTOMY;  Surgeon: Daneil Dolin, MD;  [facility];  Service:  Endoscopy;;  ascending colon;    Current Outpatient Rx  . Order #: [id number] Class: Normal  . Order #: [id number] Class: Print  . Order #: [id number] Class: Normal  . Order #: [id number] Class: Historical Med  . Order #: [id number] Class: Normal    Allergies Patient has no known allergies.  Family History  Problem Relation Age of Onset  . Liver disease Neg Hx   . Colon cancer Neg Hx   . Colon polyps Neg Hx     Social History Social History   Tobacco Use  . Smoking status: Never Smoker  . Smokeless tobacco: Never Used  Substance Use Topics  . Alcohol use: No    Comment: Quit September 2017, heavy alcohol abuse previously  . Drug use: No    Review of Systems  Constitutional: No fever/chills. Positive fatigue and elevated blood sugar.  Eyes: No visual changes. ENT: No sore throat. Cardiovascular: Denies chest pain. Respiratory: Denies shortness of breath. Gastrointestinal: Positive abdominal pain. Positive nausea, no vomiting.  No diarrhea.  No constipation. Genitourinary: Negative for dysuria. Musculoskeletal: Negative for back pain. Skin: Negative for rash. Neurological: Negative for headaches, focal weakness or numbness.  10-point ROS otherwise negative.  ____________________________________________   PHYSICAL EXAM:  VITAL SIGNS: ED Triage Vitals  Enc Vitals Group     BP 08/14/17 1832 (!) 148/91     Pulse Rate 08/14/17 1832 78     Resp 08/14/17 1832 16     Temp 08/14/17 1832 98.4 F (36.9 C)     Temp Source 08/14/17  1832 Oral     SpO2 08/14/17 1832 99 %     Weight 08/14/17 1832 160 lb (72.6 kg)     Height 08/14/17 1832 5\' 8"  (1.727 m)     Pain Score 08/14/17 1833 10    Constitutional: Alert and oriented. Well appearing and in no acute distress. Eyes: Conjunctivae are normal. Head: Atraumatic. Nose: No congestion/rhinnorhea. Mouth/Throat: Mucous membranes are slightly dry.  Neck: No stridor.  Cardiovascular: Normal rate, regular rhythm. Good peripheral  circulation. Grossly normal heart sounds.   Respiratory: Normal respiratory effort.  No retractions. Lungs CTAB. Gastrointestinal: Soft and nontender. No distention.  Musculoskeletal: No lower extremity tenderness nor edema. No gross deformities of extremities. Neurologic:  Normal speech and language. No gross focal neurologic deficits are appreciated.  Skin:  Skin is warm, dry and intact. No rash noted.  ____________________________________________   LABS (all labs ordered are listed, but only abnormal results are displayed)  Labs Reviewed  COMPREHENSIVE METABOLIC PANEL - Abnormal; Notable for the following components:      Result Value   Chloride 97 (*)    Glucose, Bld 128 (*)    Total Protein 9.4 (*)    AST 49 (*)    Alkaline Phosphatase 181 (*)    All other components within normal limits  URINALYSIS, ROUTINE W REFLEX MICROSCOPIC - Abnormal; Notable for the following components:   Color, Urine STRAW (*)    Specific Gravity, Urine 1.004 (*)    All other components within normal limits  CBG MONITORING, ED - Abnormal; Notable for the following components:   Glucose-Capillary 153 (*)    All other components within normal limits  CBG MONITORING, ED - Abnormal; Notable for the following components:   Glucose-Capillary 116 (*)    All other components within normal limits  CBC WITH DIFFERENTIAL/PLATELET   ____________________________________________  RADIOLOGY  None ____________________________________________   PROCEDURES  Procedure(s) performed:   Procedures  None ____________________________________________   INITIAL IMPRESSION / ASSESSMENT AND PLAN / ED COURSE  Pertinent labs & imaging results that were available during my care of the patient were reviewed by me and considered in my medical decision making (see chart for details).  Patient following with GI for abdominal pain and nausea was found to have new hyperglycemia today. Labs from triage show more  modest hyperglycemia and no DKA. Patient is very well appearing. No abdominal tenderness to palpation or ascites clinically. Lungs are CTABL. Plan to start Metformin and discussed diet changes for hyperglycemia. Will follow with PCP. Provided contact info at discharge.   At this time, I do not feel there is any life-threatening condition present. I have reviewed and discussed all results (EKG, imaging, lab, urine as appropriate), exam findings with patient. I have reviewed nursing notes and appropriate previous records.  I feel the patient is safe to be discharged home without further emergent workup. Discussed usual and customary return precautions. Patient and family (if present) verbalize understanding and are comfortable with this plan.  Patient will follow-up with their primary care provider. If they do not have a primary care provider, information for follow-up has been provided to them. All questions have been answered.  ____________________________________________  FINAL CLINICAL IMPRESSION(S) / ED DIAGNOSES  Final diagnoses:  Hyperglycemia    NEW OUTPATIENT MEDICATIONS STARTED DURING THIS VISIT:  Discharge Medication List as of 08/14/2017 10:47 PM    START taking these medications   Details  metFORMIN (GLUCOPHAGE) 500 MG tablet Take 1 tablet (500 mg total) by mouth 2 (  two) times daily with a meal., Starting Thu 08/14/2017, Until Sat 09/13/2017, Print        Note:  This document was prepared using Dragon voice recognition software and may include unintentional dictation errors.  Nanda Quinton, MD Emergency Medicine    Magdelena Kinsella, [facility], MD 08/15/17 [phone]

## 2017-08-14 NOTE — Telephone Encounter (Signed)
Pt called with c/o feeling lightheaded when trying to standup, pain in his back that feels like its push through to his stomach and some abdomen pain. Pt reports no nausea, no vomiting. Pt is able to eat very small meals due to feeling full every time he starts to eat and pt is drinking almost 4 bottles of 16 oz water daily. Pt occasionally drinks a little orange juice but mainly drinks water.

## 2017-08-14 NOTE — Discharge Instructions (Signed)
You were seen in the emergency department today with elevated blood sugar.  This could be the sign of new diabetes.  Starting you on medication for high blood sugar and will have you follow with the primary care provider for further testing and treatment.  Try to eat less sugar and increase her exercise.  Drink lots of water.  Return to the emergency department with any abdominal pain, nausea, vomiting, or other concerning symptoms.

## 2017-08-15 ENCOUNTER — Other Ambulatory Visit: Payer: Self-pay

## 2017-08-15 DIAGNOSIS — Z7689 Persons encountering health services in other specified circumstances: Secondary | ICD-10-CM

## 2017-08-15 DIAGNOSIS — R7309 Other abnormal glucose: Secondary | ICD-10-CM

## 2017-08-15 NOTE — Progress Notes (Signed)
He went to ED and blood sugar was in the low 1teens, so I'm glad about that. We need to get him established with a PCP ASAP. Please refer to Dr. Mannie Stabile or Dr. Meda Coffee if possible. In interim, we can check an A1c if he is willing. I am also talking to Dr. Arnoldo Morale about his case.

## 2017-08-15 NOTE — Progress Notes (Signed)
Discussed with Dr. Arnoldo Morale as well and gave clinical history. He will be seeing Dr. Arnoldo Morale next week.

## 2017-08-19 ENCOUNTER — Encounter: Payer: Self-pay | Admitting: General Surgery

## 2017-08-19 ENCOUNTER — Ambulatory Visit (INDEPENDENT_AMBULATORY_CARE_PROVIDER_SITE_OTHER): Payer: Self-pay | Admitting: General Surgery

## 2017-08-19 VITALS — BP 148/84 | HR 78 | Temp 98.2°F | Ht 68.0 in | Wt 162.0 lb

## 2017-08-19 DIAGNOSIS — K8020 Calculus of gallbladder without cholecystitis without obstruction: Secondary | ICD-10-CM

## 2017-08-19 NOTE — Patient Instructions (Signed)

## 2017-08-19 NOTE — Progress Notes (Signed)
Thomas Pratt; [id number]; 12/22/1960   HPI Patient is a 57 year old Hispanic male who is been referred back to my care for evaluation and treatment of biliary colic secondary to cholelithiasis.  He has had upper abdominal pain with nausea for the past year.  He has been followed by Roseanne Kaufman of GI.  He had a known history of cholelithiasis.  He does have a history of cirrhosis.  He currently has a pain level of 6 out of 10.  Any foods make him nauseated.  The pain is along the right side of his abdomen.  He denies any fever, chills, jaundice.  His cirrhosis is under good control.  He has not drank alcohol for some time now.  He did have a HIDA scan which revealed a borderline gallbladder ejection fraction with reproducible symptoms with a fatty meal. Past Medical History:  Diagnosis Date  . Cirrhosis ([place])    likely ETOH use   . Hypertension     Past Surgical History:  Procedure Laterality Date  . COLONOSCOPY WITH PROPOFOL N/A 03/06/2017   Procedure: COLONOSCOPY WITH PROPOFOL;  Surgeon: Daneil Dolin, MD;  [facility];  Service: Endoscopy;  Laterality: N/A;  12:45pm  . ESOPHAGOGASTRODUODENOSCOPY (EGD) WITH PROPOFOL N/A 03/06/2017   Procedure: ESOPHAGOGASTRODUODENOSCOPY (EGD) WITH PROPOFOL;  Surgeon: Daneil Dolin, MD;  [facility];  Service: Endoscopy;  Laterality: N/A;  . HERNIA REPAIR    . INGUINAL HERNIA REPAIR Right 07/11/2017   Procedure: HERNIA REPAIR INGUINAL ADULT WITH MESH;  Surgeon: Aviva Signs, MD;  Location: [facility];  Service: General;  Laterality: Right;  patient knows to arrive at 7:30  . NO PAST SURGERIES    . POLYPECTOMY  03/06/2017   Procedure: POLYPECTOMY;  Surgeon: Daneil Dolin, MD;  [facility];  Service: Endoscopy;;  ascending colon;    Family History  Problem Relation Age of Onset  . Liver disease Neg Hx   . Colon cancer Neg Hx   . Colon polyps Neg Hx     Current Outpatient Medications on File Prior to Visit   Medication Sig Dispense Refill  . furosemide (LASIX) 40 MG tablet Take 1 tablet (40 mg total) by mouth daily. 30 tablet 5  . metFORMIN (GLUCOPHAGE) 500 MG tablet Take 1 tablet (500 mg total) by mouth 2 (two) times daily with a meal. 60 tablet 0  . nadolol (CORGARD) 40 MG tablet Take 1 tablet (40 mg total) by mouth daily. And 1/2 tablet in evening (Patient taking differently: Take 20-40 mg by mouth See admin instructions. Take 40 mg in the morning And 1/2 tablet (20 mg) in evening) 60 tablet 3  . pantoprazole (PROTONIX) 40 MG tablet Take 40 mg by mouth daily.    Marland Kitchen spironolactone (ALDACTONE) 100 MG tablet Take 1 tablet (100 mg total) by mouth daily. 30 tablet 5   No current facility-administered medications on file prior to visit.     No Known Allergies  Social History   Substance and Sexual Activity  Alcohol Use No   Comment: Quit September 2017, heavy alcohol abuse previously    Social History   Tobacco Use  Smoking Status Never Smoker  Smokeless Tobacco Never Used    Review of Systems  Constitutional: Positive for malaise/fatigue.  HENT: Negative.   Eyes: Negative.   Respiratory: Negative.   Cardiovascular: Negative.   Gastrointestinal: Positive for abdominal pain and nausea.  Genitourinary: Negative.   Musculoskeletal: Negative.   Skin: Negative.   Neurological:  Negative.   Endo/Heme/Allergies: Negative.   Psychiatric/Behavioral: Negative.     Objective   Vitals:   08/19/17 1333  BP: (!) 148/84  Pulse: 78  Temp: 98.2 F (36.8 C)    Physical Exam  Constitutional: He is oriented to person, place, and time and well-developed, well-nourished, and in no distress.  HENT:  Head: Normocephalic and atraumatic.  Eyes: No scleral icterus.  Cardiovascular: Normal rate, regular rhythm and normal heart sounds. Exam reveals no gallop and no friction rub.  No murmur heard. Pulmonary/Chest: Effort normal and breath sounds normal. No respiratory distress. He has no  wheezes. He has no rales.  Abdominal: Soft. Bowel sounds are normal. He exhibits no distension. There is no tenderness. There is no rebound.  No ascites appreciable.  Neurological: He is alert and oriented to person, place, and time.  Skin: Skin is warm and dry.  Vitals reviewed.   Assessment  Biliary colic, cholelithiasis, cirrhosis Plan   Patient is scheduled for laparoscopic cholecystectomy with liver biopsy on 09/01/2017.  The risks and benefits of the procedures including bleeding, infection, hepatobiliary injury, and the possibility of an open procedure were fully explained to the patient, who gave informed consent.

## 2017-08-19 NOTE — H&P (Signed)
Thomas Pratt; [id number]; [birth year]   HPI Patient is a 57 year old Hispanic male who is been referred back to my care for evaluation and treatment of biliary colic secondary to cholelithiasis.  He has had upper abdominal pain with nausea for the past year.  He has been followed by Roseanne Kaufman of GI.  He had a known history of cholelithiasis.  He does have a history of cirrhosis.  He currently has a pain level of 6 out of 10.  Any foods make him nauseated.  The pain is along the right side of his abdomen.  He denies any fever, chills, jaundice.  His cirrhosis is under good control.  He has not drank alcohol for some time now.  He did have a HIDA scan which revealed a borderline gallbladder ejection fraction with reproducible symptoms with a fatty meal.     Past Medical History:  Diagnosis Date  . Cirrhosis ([place])    likely ETOH use   . Hypertension          Past Surgical History:  Procedure Laterality Date  . COLONOSCOPY WITH PROPOFOL N/A 03/06/2017   Procedure: COLONOSCOPY WITH PROPOFOL;  Surgeon: Daneil Dolin, MD;  [facility];  Service: Endoscopy;  Laterality: N/A;  12:45pm  . ESOPHAGOGASTRODUODENOSCOPY (EGD) WITH PROPOFOL N/A 03/06/2017   Procedure: ESOPHAGOGASTRODUODENOSCOPY (EGD) WITH PROPOFOL;  Surgeon: Daneil Dolin, MD;  [facility];  Service: Endoscopy;  Laterality: N/A;  . HERNIA REPAIR    . INGUINAL HERNIA REPAIR Right 07/11/2017   Procedure: HERNIA REPAIR INGUINAL ADULT WITH MESH;  Surgeon: Aviva Signs, MD;  Location: [facility];  Service: General;  Laterality: Right;  patient knows to arrive at 7:30  . NO PAST SURGERIES    . POLYPECTOMY  03/06/2017   Procedure: POLYPECTOMY;  Surgeon: Daneil Dolin, MD;  [facility];  Service: Endoscopy;;  ascending colon;         Family History  Problem Relation Age of Onset  . Liver disease Neg Hx   . Colon cancer Neg Hx   . Colon polyps Neg Hx     Current Outpatient  Medications on File Prior to Visit  Medication Sig Dispense Refill  . furosemide (LASIX) 40 MG tablet Take 1 tablet (40 mg total) by mouth daily. 30 tablet 5  . metFORMIN (GLUCOPHAGE) 500 MG tablet Take 1 tablet (500 mg total) by mouth 2 (two) times daily with a meal. 60 tablet 0  . nadolol (CORGARD) 40 MG tablet Take 1 tablet (40 mg total) by mouth daily. And 1/2 tablet in evening (Patient taking differently: Take 20-40 mg by mouth See admin instructions. Take 40 mg in the morning And 1/2 tablet (20 mg) in evening) 60 tablet 3  . pantoprazole (PROTONIX) 40 MG tablet Take 40 mg by mouth daily.    Marland Kitchen spironolactone (ALDACTONE) 100 MG tablet Take 1 tablet (100 mg total) by mouth daily. 30 tablet 5   No current facility-administered medications on file prior to visit.     No Known Allergies  Social History       Substance and Sexual Activity  Alcohol Use No   Comment: Quit September 2017, heavy alcohol abuse previously    Social History      Tobacco Use  Smoking Status Never Smoker  Smokeless Tobacco Never Used    Review of Systems  Constitutional: Positive for malaise/fatigue.  HENT: Negative.   Eyes: Negative.   Respiratory: Negative.   Cardiovascular: Negative.  Gastrointestinal: Positive for abdominal pain and nausea.  Genitourinary: Negative.   Musculoskeletal: Negative.   Skin: Negative.   Neurological: Negative.   Endo/Heme/Allergies: Negative.   Psychiatric/Behavioral: Negative.     Objective      Vitals:   08/19/17 1333  BP: (!) 148/84  Pulse: 78  Temp: 98.2 F (36.8 C)    Physical Exam  Constitutional: He is oriented to person, place, and time and well-developed, well-nourished, and in no distress.  HENT:  Head: Normocephalic and atraumatic.  Eyes: No scleral icterus.  Cardiovascular: Normal rate, regular rhythm and normal heart sounds. Exam reveals no gallop and no friction rub.  No murmur heard. Pulmonary/Chest: Effort normal  and breath sounds normal. No respiratory distress. He has no wheezes. He has no rales.  Abdominal: Soft. Bowel sounds are normal. He exhibits no distension. There is no tenderness. There is no rebound.  No ascites appreciable.  Neurological: He is alert and oriented to person, place, and time.  Skin: Skin is warm and dry.  Vitals reviewed.   Assessment  Biliary colic, cholelithiasis, cirrhosis Plan   Patient is scheduled for laparoscopic cholecystectomy with liver biopsy on 09/01/2017.  The risks and benefits of the procedures including bleeding, infection, hepatobiliary injury, and the possibility of an open procedure were fully explained to the patient, who gave informed consent.

## 2017-08-20 NOTE — Progress Notes (Signed)
Please arrange routine follow-up with [place] end of Feb 2019.

## 2017-08-21 ENCOUNTER — Encounter: Payer: Self-pay | Admitting: Internal Medicine

## 2017-08-21 NOTE — Progress Notes (Signed)
PATIENT SCHEDULED AND LETTER SENT  °

## 2017-08-22 ENCOUNTER — Other Ambulatory Visit: Payer: Self-pay | Admitting: Gastroenterology

## 2017-08-22 ENCOUNTER — Other Ambulatory Visit: Payer: Self-pay | Admitting: Nurse Practitioner

## 2017-08-22 MED ORDER — FUROSEMIDE 40 MG PO TABS: 40.0000 mg | ORAL_TABLET | Freq: Every day | ORAL | 5 refills | Status: DC

## 2017-08-22 NOTE — Telephone Encounter (Signed)
Patient called to let us know he's having surgery with Dr. Arnoldo Morale next week.

## 2017-08-24 NOTE — Patient Instructions (Signed)
Thomas Pratt  08/24/2017     @PREFPERIOPPHARMACY @   Your procedure is scheduled on  09/01/2017   Report to [name] at  615  A.M.  Call this number if you have problems the morning of surgery:  [phone]   Remember:  Do not eat food or drink liquids after midnight.  Take these medicines the morning of surgery with A SIP OF WATER  Nadolol, protonix.   Do not wear jewelry, make-up or nail polish.  Do not wear lotions, powders, or perfumes, or deodorant.  Do not shave 48 hours prior to surgery.  Men may shave face and neck.  Do not bring valuables to the hospital.  [facility] is not responsible for any belongings or valuables.  Contacts, dentures or bridgework may not be worn into surgery.  Leave your suitcase in the car.  After surgery it may be brought to your room.  For patients admitted to the hospital, discharge time will be determined by your treatment team.  Patients discharged the day of surgery will not be allowed to drive home.   Name and phone number of your driver:   family Special instructions:  None  Please read over the following fact sheets that you were given. Anesthesia Post-op Instructions and Care and Recovery After Surgery       Laparoscopic Cholecystectomy Laparoscopic cholecystectomy is surgery to remove the gallbladder. The gallbladder is a pear-shaped organ that lies beneath the liver on the right side of the body. The gallbladder stores bile, which is a fluid that helps the body to digest fats. Cholecystectomy is often done for inflammation of the gallbladder (cholecystitis). This condition is usually caused by a buildup of gallstones (cholelithiasis) in the gallbladder. Gallstones can block the flow of bile, which can result in inflammation and pain. In severe cases, emergency surgery may be required. This procedure is done though small incisions in your abdomen (laparoscopic surgery). A thin scope with a camera (laparoscope)  is inserted through one incision. Thin surgical instruments are inserted through the other incisions. In some cases, a laparoscopic procedure may be turned into a type of surgery that is done through a larger incision (open surgery). Tell a health care provider about:  Any allergies you have.  All medicines you are taking, including vitamins, herbs, eye drops, creams, and over-the-counter medicines.  Any problems you or family members have had with anesthetic medicines.  Any blood disorders you have.  Any surgeries you have had.  Any medical conditions you have.  Whether you are pregnant or may be pregnant. What are the risks? Generally, this is a safe procedure. However, problems may occur, including:  Infection.  Bleeding.  Allergic reactions to medicines.  Damage to other structures or organs.  A stone remaining in the common bile duct. The common bile duct carries bile from the gallbladder into the small intestine.  A bile leak from the cyst duct that is clipped when your gallbladder is removed.  What happens before the procedure? Staying hydrated Follow instructions from your health care provider about hydration, which may include:  Up to 2 hours before the procedure - you may continue to drink clear liquids, such as water, clear fruit juice, black coffee, and plain tea.  Eating and drinking restrictions Follow instructions from your health care provider about eating and drinking, which may include:  8 hours before the procedure - stop eating heavy meals or foods such as meat,  fried foods, or fatty foods.  6 hours before the procedure - stop eating light meals or foods, such as toast or cereal.  6 hours before the procedure - stop drinking milk or drinks that contain milk.  2 hours before the procedure - stop drinking clear liquids.  Medicines  Ask your health care provider about: ? Changing or stopping your regular medicines. This is especially important if  you are taking diabetes medicines or blood thinners. ? Taking medicines such as aspirin and ibuprofen. These medicines can thin your blood. Do not take these medicines before your procedure if your health care provider instructs you not to.  You may be given antibiotic medicine to help prevent infection. General instructions  Let your health care provider know if you develop a cold or an infection before surgery.  Plan to have someone take you home from the hospital or clinic.  Ask your health care provider how your surgical site will be marked or identified. What happens during the procedure?  To reduce your risk of infection: ? Your health care team will wash or sanitize their hands. ? Your skin will be washed with soap. ? Hair may be removed from the surgical area.  An IV tube may be inserted into one of your veins.  You will be given one or more of the following: ? A medicine to help you relax (sedative). ? A medicine to make you fall asleep (general anesthetic).  A breathing tube will be placed in your mouth.  Your surgeon will make several small cuts (incisions) in your abdomen.  The laparoscope will be inserted through one of the small incisions. The camera on the laparoscope will send images to a TV screen (monitor) in the operating room. This lets your surgeon see inside your abdomen.  Air-like gas will be pumped into your abdomen. This will expand your abdomen to give the surgeon more room to perform the surgery.  Other tools that are needed for the procedure will be inserted through the other incisions. The gallbladder will be removed through one of the incisions.  Your common bile duct may be examined. If stones are found in the common bile duct, they may be removed.  After your gallbladder has been removed, the incisions will be closed with stitches (sutures), staples, or skin glue.  Your incisions may be covered with a bandage (dressing). The procedure may vary  among health care providers and hospitals. What happens after the procedure?  Your blood pressure, heart rate, breathing rate, and blood oxygen level will be monitored until the medicines you were given have worn off.  You will be given medicines as needed to control your pain.  Do not drive for 24 hours if you were given a sedative. This information is not intended to replace advice given to you by your health care provider. Make sure you discuss any questions you have with your health care provider. Document Released: 07/08/2005 Document Revised: 01/28/2016 Document Reviewed: 12/25/2015 Elsevier Interactive Patient Education  2018 [employer].  Laparoscopic Cholecystectomy, Care After This sheet gives you information about how to care for yourself after your procedure. Your health care provider may also give you more specific instructions. If you have problems or questions, contact your health care provider. What can I expect after the procedure? After the procedure, it is common to have:  Pain at your incision sites. You will be given medicines to control this pain.  Mild nausea or vomiting.  Bloating and possible shoulder  pain from the air-like gas that was used during the procedure.  Follow these instructions at home: Incision care   Follow instructions from your health care provider about how to take care of your incisions. Make sure you: ? Wash your hands with soap and water before you change your bandage (dressing). If soap and water are not available, use hand sanitizer. ? Change your dressing as told by your health care provider. ? Leave stitches (sutures), skin glue, or adhesive strips in place. These skin closures may need to be in place for 2 weeks or longer. If adhesive strip edges start to loosen and curl up, you may trim the loose edges. Do not remove adhesive strips completely unless your health care provider tells you to do that.  Do not take baths, swim, or use a  hot tub until your health care provider approves. Ask your health care provider if you can take showers. You may only be allowed to take sponge baths for bathing.  Check your incision area every day for signs of infection. Check for: ? More redness, swelling, or pain. ? More fluid or blood. ? Warmth. ? Pus or a bad smell. Activity  Do not drive or use heavy machinery while taking prescription pain medicine.  Do not lift anything that is heavier than 10 lb (4.5 kg) until your health care provider approves.  Do not play contact sports until your health care provider approves.  Do not drive for 24 hours if you were given a medicine to help you relax (sedative).  Rest as needed. Do not return to work or school until your health care provider approves. General instructions  Take over-the-counter and prescription medicines only as told by your health care provider.  To prevent or treat constipation while you are taking prescription pain medicine, your health care provider may recommend that you: ? Drink enough fluid to keep your urine clear or pale yellow. ? Take over-the-counter or prescription medicines. ? Eat foods that are high in fiber, such as fresh fruits and vegetables, whole grains, and beans. ? Limit foods that are high in fat and processed sugars, such as fried and sweet foods. Contact a health care provider if:  You develop a rash.  You have more redness, swelling, or pain around your incisions.  You have more fluid or blood coming from your incisions.  Your incisions feel warm to the touch.  You have pus or a bad smell coming from your incisions.  You have a fever.  One or more of your incisions breaks open. Get help right away if:  You have trouble breathing.  You have chest pain.  You have increasing pain in your shoulders.  You faint or feel dizzy when you stand.  You have severe pain in your abdomen.  You have nausea or vomiting that lasts for more  than one day.  You have leg pain. This information is not intended to replace advice given to you by your health care provider. Make sure you discuss any questions you have with your health care provider. Document Released: 07/08/2005 Document Revised: 01/27/2016 Document Reviewed: 12/25/2015 Elsevier Interactive Patient Education  2018 [place] Anesthesia, Adult General anesthesia is the use of medicines to make a person "go to sleep" (be unconscious) for a medical procedure. General anesthesia is often recommended when a procedure:  Is long.  Requires you to be still or in an unusual position.  Is major and can cause you to lose blood.  Is impossible to do without general anesthesia.  The medicines used for general anesthesia are called general anesthetics. In addition to making you sleep, the medicines:  Prevent pain.  Control your blood pressure.  Relax your muscles.  Tell a health care provider about:  Any allergies you have.  All medicines you are taking, including vitamins, herbs, eye drops, creams, and over-the-counter medicines.  Any problems you or family members have had with anesthetic medicines.  Types of anesthetics you have had in the past.  Any bleeding disorders you have.  Any surgeries you have had.  Any medical conditions you have.  Any history of heart or lung conditions, such as heart failure, sleep apnea, or chronic obstructive pulmonary disease (COPD).  Whether you are pregnant or may be pregnant.  Whether you use tobacco, alcohol, marijuana, or street drugs.  Any history of Armed forces logistics/support/administrative officer.  Any history of depression or anxiety. What are the risks? Generally, this is a safe procedure. However, problems may occur, including:  Allergic reaction to anesthetics.  Lung and heart problems.  Inhaling food or liquids from your stomach into your lungs (aspiration).  Injury to nerves.  Waking up during your procedure and  being unable to move (rare).  Extreme agitation or a state of mental confusion (delirium) when you wake up from the anesthetic.  Air in the bloodstream, which can lead to stroke.  These problems are more likely to develop if you are having a major surgery or if you have an advanced medical condition. You can prevent some of these complications by answering all of your health care provider's questions thoroughly and by following all pre-procedure instructions. General anesthesia can cause side effects, including:  Nausea or vomiting  A sore throat from the breathing tube.  Feeling cold or shivery.  Feeling tired, washed out, or achy.  Sleepiness or drowsiness.  Confusion or agitation.  What happens before the procedure? Staying hydrated Follow instructions from your health care provider about hydration, which may include:  Up to 2 hours before the procedure - you may continue to drink clear liquids, such as water, clear fruit juice, black coffee, and plain tea.  Eating and drinking restrictions Follow instructions from your health care provider about eating and drinking, which may include:  8 hours before the procedure - stop eating heavy meals or foods such as meat, fried foods, or fatty foods.  6 hours before the procedure - stop eating light meals or foods, such as toast or cereal.  6 hours before the procedure - stop drinking milk or drinks that contain milk.  2 hours before the procedure - stop drinking clear liquids.  Medicines  Ask your health care provider about: ? Changing or stopping your regular medicines. This is especially important if you are taking diabetes medicines or blood thinners. ? Taking medicines such as aspirin and ibuprofen. These medicines can thin your blood. Do not take these medicines before your procedure if your health care provider instructs you not to. ? Taking new dietary supplements or medicines. Do not take these during the week before  your procedure unless your health care provider approves them.  If you are told to take a medicine or to continue taking a medicine on the day of the procedure, take the medicine with sips of water. General instructions   Ask if you will be going home the same day, the following day, or after a longer hospital stay. ? Plan to have someone take you home. ? Plan  to have someone stay with you for the first 24 hours after you leave the hospital or clinic.  For 3-6 weeks before the procedure, try not to use any tobacco products, such as cigarettes, chewing tobacco, and e-cigarettes.  You may brush your teeth on the morning of the procedure, but make sure to spit out the toothpaste. What happens during the procedure?  You will be given anesthetics through a mask and through an IV tube in one of your veins.  You may receive medicine to help you relax (sedative).  As soon as you are asleep, a breathing tube may be used to help you breathe.  An anesthesia specialist will stay with you throughout the procedure. He or she will help keep you comfortable and safe by continuing to give you medicines and adjusting the amount of medicine that you get. He or she will also watch your blood pressure, pulse, and oxygen levels to make sure that the anesthetics do not cause any problems.  If a breathing tube was used to help you breathe, it will be removed before you wake up. The procedure may vary among health care providers and hospitals. What happens after the procedure?  You will wake up, often slowly, after the procedure is complete, usually in a recovery area.  Your blood pressure, heart rate, breathing rate, and blood oxygen level will be monitored until the medicines you were given have worn off.  You may be given medicine to help you calm down if you feel anxious or agitated.  If you will be going home the same day, your health care provider may check to make sure you can stand, drink, and  urinate.  Your health care providers will treat your pain and side effects before you go home.  Do not drive for 24 hours if you received a sedative.  You may: ? Feel nauseous and vomit. ? Have a sore throat. ? Have mental slowness. ? Feel cold or shivery. ? Feel sleepy. ? Feel tired. ? Feel sore or achy, even in parts of your body where you did not have surgery. This information is not intended to replace advice given to you by your health care provider. Make sure you discuss any questions you have with your health care provider. Document Released: 10/15/2007 Document Revised: 12/19/2015 Document Reviewed: 06/22/2015 Elsevier Interactive Patient Education  2018 [place] Anesthesia, Adult, Care After These instructions provide you with information about caring for yourself after your procedure. Your health care provider may also give you more specific instructions. Your treatment has been planned according to current medical practices, but problems sometimes occur. Call your health care provider if you have any problems or questions after your procedure. What can I expect after the procedure? After the procedure, it is common to have:  Vomiting.  A sore throat.  Mental slowness.  It is common to feel:  Nauseous.  Cold or shivery.  Sleepy.  Tired.  Sore or achy, even in parts of your body where you did not have surgery.  Follow these instructions at home: For at least 24 hours after the procedure:  Do not: ? Participate in activities where you could fall or become injured. ? Drive. ? Use heavy machinery. ? Drink alcohol. ? Take sleeping pills or medicines that cause drowsiness. ? Make important decisions or sign legal documents. ? Take care of children on your own.  Rest. Eating and drinking  If you vomit, drink water, juice, or soup when you can  drink without vomiting.  Drink enough fluid to keep your urine clear or pale yellow.  Make sure you  have little or no nausea before eating solid foods.  Follow the diet recommended by your health care provider. General instructions  Have a responsible adult stay with you until you are awake and alert.  Return to your normal activities as told by your health care provider. Ask your health care provider what activities are safe for you.  Take over-the-counter and prescription medicines only as told by your health care provider.  If you smoke, do not smoke without supervision.  Keep all follow-up visits as told by your health care provider. This is important. Contact a health care provider if:  You continue to have nausea or vomiting at home, and medicines are not helpful.  You cannot drink fluids or start eating again.  You cannot urinate after 8-12 hours.  You develop a skin rash.  You have fever.  You have increasing redness at the site of your procedure. Get help right away if:  You have difficulty breathing.  You have chest pain.  You have unexpected bleeding.  You feel that you are having a life-threatening or urgent problem. This information is not intended to replace advice given to you by your health care provider. Make sure you discuss any questions you have with your health care provider. Document Released: 10/14/2000 Document Revised: 12/11/2015 Document Reviewed: 06/22/2015 Elsevier Interactive Patient Education  [employer].

## 2017-08-26 ENCOUNTER — Ambulatory Visit: Payer: Self-pay | Admitting: Gastroenterology

## 2017-08-27 ENCOUNTER — Encounter (HOSPITAL_COMMUNITY)
Admission: RE | Admit: 2017-08-27 | Discharge: 2017-08-27 | Disposition: A | Discharge status: Home / Self Care | Payer: Self-pay | Source: Ambulatory Visit | Attending: General Surgery | Admitting: General Surgery

## 2017-08-28 ENCOUNTER — Other Ambulatory Visit: Payer: Self-pay

## 2017-08-28 ENCOUNTER — Encounter (HOSPITAL_COMMUNITY): Payer: Self-pay

## 2017-08-28 ENCOUNTER — Encounter (HOSPITAL_COMMUNITY)
Admission: RE | Admit: 2017-08-28 | Discharge: 2017-08-28 | Disposition: A | Discharge status: Home / Self Care | Payer: Medicaid Other | Source: Ambulatory Visit | Attending: General Surgery | Admitting: General Surgery

## 2017-08-28 DIAGNOSIS — Z79899 Other long term (current) drug therapy: Secondary | ICD-10-CM | POA: Diagnosis not present

## 2017-08-28 DIAGNOSIS — Z01812 Encounter for preprocedural laboratory examination: Secondary | ICD-10-CM | POA: Diagnosis present

## 2017-08-28 DIAGNOSIS — R188 Other ascites: Secondary | ICD-10-CM | POA: Diagnosis not present

## 2017-08-28 DIAGNOSIS — K7460 Unspecified cirrhosis of liver: Secondary | ICD-10-CM | POA: Diagnosis not present

## 2017-08-28 DIAGNOSIS — F1910 Other psychoactive substance abuse, uncomplicated: Secondary | ICD-10-CM | POA: Insufficient documentation

## 2017-08-28 DIAGNOSIS — I10 Essential (primary) hypertension: Secondary | ICD-10-CM | POA: Insufficient documentation

## 2017-08-28 DIAGNOSIS — K219 Gastro-esophageal reflux disease without esophagitis: Secondary | ICD-10-CM | POA: Insufficient documentation

## 2017-08-28 DIAGNOSIS — E119 Type 2 diabetes mellitus without complications: Secondary | ICD-10-CM | POA: Diagnosis not present

## 2017-08-28 DIAGNOSIS — Z7289 Other problems related to lifestyle: Secondary | ICD-10-CM | POA: Diagnosis not present

## 2017-08-28 HISTORY — DX: Gastro-esophageal reflux disease without esophagitis: K21.9

## 2017-08-28 LAB — CBC WITH DIFFERENTIAL/PLATELET
BASOS ABS: 0 10*3/uL (ref 0.0–0.1)
Basophils Relative: 0 %
EOS PCT: 1 %
Eosinophils Absolute: 0.1 10*3/uL (ref 0.0–0.7)
HEMATOCRIT: 41.7 % (ref 39.0–52.0)
Hemoglobin: 14.2 g/dL (ref 13.0–17.0)
LYMPHS ABS: 2.2 10*3/uL (ref 0.7–4.0)
LYMPHS PCT: 26 %
MCH: 32.5 pg (ref 26.0–34.0)
MCHC: 34.1 g/dL (ref 30.0–36.0)
MCV: 95.4 fL (ref 78.0–100.0)
MONO ABS: 0.7 10*3/uL (ref 0.1–1.0)
Monocytes Relative: 8 %
NEUTROS ABS: 5.6 10*3/uL (ref 1.7–7.7)
Neutrophils Relative %: 65 %
PLATELETS: 260 10*3/uL (ref 150–400)
RBC: 4.37 MIL/uL (ref 4.22–5.81)
RDW: 13.5 % (ref 11.5–15.5)
WBC: 8.6 10*3/uL (ref 4.0–10.5)

## 2017-08-28 LAB — HEMOGLOBIN A1C
Hgb A1c MFr Bld: 6.5 % — ABNORMAL HIGH (ref 4.8–5.6)
MEAN PLASMA GLUCOSE: 139.85 mg/dL

## 2017-08-28 LAB — COMPREHENSIVE METABOLIC PANEL
ALT: 62 U/L (ref 17–63)
AST: 60 U/L — AB (ref 15–41)
Albumin: 4.3 g/dL (ref 3.5–5.0)
Alkaline Phosphatase: 167 U/L — ABNORMAL HIGH (ref 38–126)
Anion gap: 11 (ref 5–15)
BILIRUBIN TOTAL: 1 mg/dL (ref 0.3–1.2)
BUN: 13 mg/dL (ref 6–20)
CALCIUM: 10.3 mg/dL (ref 8.9–10.3)
CO2: 25 mmol/L (ref 22–32)
CREATININE: 0.86 mg/dL (ref 0.61–1.24)
Chloride: 95 mmol/L — ABNORMAL LOW (ref 101–111)
Glucose, Bld: 117 mg/dL — ABNORMAL HIGH (ref 65–99)
Potassium: 4.1 mmol/L (ref 3.5–5.1)
Sodium: 131 mmol/L — ABNORMAL LOW (ref 135–145)
Total Protein: 9.7 g/dL — ABNORMAL HIGH (ref 6.5–8.1)

## 2017-08-28 LAB — GLUCOSE, CAPILLARY: Glucose-Capillary: 111 mg/dL — ABNORMAL HIGH (ref 65–99)

## 2017-08-28 LAB — PROTIME-INR
INR: 1
PROTHROMBIN TIME: 13.1 s (ref 11.4–15.2)

## 2017-08-29 NOTE — Pre-Procedure Instructions (Signed)
[id number] routed to Care team.

## 2017-09-01 ENCOUNTER — Encounter (HOSPITAL_COMMUNITY)
Admission: RE | Disposition: A | Discharge status: Home / Self Care | Payer: Self-pay | Source: Ambulatory Visit | Attending: General Surgery

## 2017-09-01 ENCOUNTER — Ambulatory Visit (HOSPITAL_COMMUNITY)
Admission: RE | Admit: 2017-09-01 | Discharge: 2017-09-01 | Disposition: A | Discharge status: Home / Self Care | Payer: Medicaid Other | Source: Ambulatory Visit | Attending: General Surgery | Admitting: General Surgery

## 2017-09-01 ENCOUNTER — Ambulatory Visit (HOSPITAL_COMMUNITY): Payer: Medicaid Other | Admitting: Anesthesiology

## 2017-09-01 DIAGNOSIS — I10 Essential (primary) hypertension: Secondary | ICD-10-CM | POA: Diagnosis not present

## 2017-09-01 DIAGNOSIS — K8070 Calculus of gallbladder and bile duct without cholecystitis without obstruction: Secondary | ICD-10-CM | POA: Diagnosis present

## 2017-09-01 DIAGNOSIS — K8020 Calculus of gallbladder without cholecystitis without obstruction: Secondary | ICD-10-CM

## 2017-09-01 DIAGNOSIS — K7460 Unspecified cirrhosis of liver: Secondary | ICD-10-CM | POA: Diagnosis not present

## 2017-09-01 DIAGNOSIS — K8064 Calculus of gallbladder and bile duct with chronic cholecystitis without obstruction: Secondary | ICD-10-CM | POA: Insufficient documentation

## 2017-09-01 DIAGNOSIS — Z79899 Other long term (current) drug therapy: Secondary | ICD-10-CM | POA: Diagnosis not present

## 2017-09-01 DIAGNOSIS — K219 Gastro-esophageal reflux disease without esophagitis: Secondary | ICD-10-CM | POA: Diagnosis not present

## 2017-09-01 DIAGNOSIS — E119 Type 2 diabetes mellitus without complications: Secondary | ICD-10-CM | POA: Insufficient documentation

## 2017-09-01 HISTORY — PX: CHOLECYSTECTOMY: SHX55

## 2017-09-01 LAB — GLUCOSE, CAPILLARY
GLUCOSE-CAPILLARY: 123 mg/dL — AB (ref 65–99)
Glucose-Capillary: 134 mg/dL — ABNORMAL HIGH (ref 65–99)

## 2017-09-01 SURGERY — LAPAROSCOPIC CHOLECYSTECTOMY
Anesthesia: General

## 2017-09-01 MED ORDER — PROPOFOL 10 MG/ML IV BOLUS
INTRAVENOUS | Status: AC
  Filled 2017-09-01: qty 40

## 2017-09-01 MED ORDER — EPHEDRINE SULFATE 50 MG/ML IJ SOLN
INTRAMUSCULAR | Status: AC
  Filled 2017-09-01: qty 1

## 2017-09-01 MED ORDER — ROCURONIUM BROMIDE 100 MG/10ML IV SOLN
INTRAVENOUS | Status: DC | PRN
  Administered 2017-09-01: 40 mg via INTRAVENOUS

## 2017-09-01 MED ORDER — LACTATED RINGERS IV SOLN
INTRAVENOUS | Status: DC
  Administered 2017-09-01 (×2): via INTRAVENOUS

## 2017-09-01 MED ORDER — LIDOCAINE HCL (CARDIAC) 20 MG/ML IV SOLN
INTRAVENOUS | Status: DC | PRN
  Administered 2017-09-01: 40 mg via INTRAVENOUS

## 2017-09-01 MED ORDER — PHENYLEPHRINE 40 MCG/ML (10ML) SYRINGE FOR IV PUSH (FOR BLOOD PRESSURE SUPPORT)
PREFILLED_SYRINGE | INTRAVENOUS | Status: AC
  Filled 2017-09-01: qty 10

## 2017-09-01 MED ORDER — MIDAZOLAM HCL 2 MG/2ML IJ SOLN
1.0000 mg | INTRAMUSCULAR | Status: AC
  Administered 2017-09-01: 2 mg via INTRAVENOUS

## 2017-09-01 MED ORDER — MIDAZOLAM HCL 2 MG/2ML IJ SOLN
INTRAMUSCULAR | Status: AC
  Filled 2017-09-01: qty 2

## 2017-09-01 MED ORDER — SODIUM CHLORIDE 0.9 % IJ SOLN
INTRAMUSCULAR | Status: AC
  Filled 2017-09-01: qty 10

## 2017-09-01 MED ORDER — FENTANYL CITRATE (PF) 250 MCG/5ML IJ SOLN
INTRAMUSCULAR | Status: AC
  Filled 2017-09-01: qty 5

## 2017-09-01 MED ORDER — PROPOFOL 10 MG/ML IV BOLUS
INTRAVENOUS | Status: DC | PRN
  Administered 2017-09-01: 150 mg via INTRAVENOUS

## 2017-09-01 MED ORDER — CHLORHEXIDINE GLUCONATE CLOTH 2 % EX PADS: 6.0000 | MEDICATED_PAD | Freq: Once | CUTANEOUS | Status: DC

## 2017-09-01 MED ORDER — SUCCINYLCHOLINE CHLORIDE 20 MG/ML IJ SOLN
INTRAMUSCULAR | Status: AC
  Filled 2017-09-01: qty 1

## 2017-09-01 MED ORDER — CEFAZOLIN SODIUM-DEXTROSE 2-4 GM/100ML-% IV SOLN
2.0000 g | INTRAVENOUS | Status: AC
  Administered 2017-09-01: 2 g via INTRAVENOUS
  Filled 2017-09-01: qty 100

## 2017-09-01 MED ORDER — HEMOSTATIC AGENTS (NO CHARGE) OPTIME
TOPICAL | Status: DC | PRN
  Administered 2017-09-01 (×2): 1 via TOPICAL

## 2017-09-01 MED ORDER — ONDANSETRON HCL 4 MG/2ML IJ SOLN
INTRAMUSCULAR | Status: AC
Start: 2017-09-01 — End: ?
  Filled 2017-09-01: qty 2

## 2017-09-01 MED ORDER — FENTANYL CITRATE (PF) 100 MCG/2ML IJ SOLN
25.0000 ug | INTRAMUSCULAR | Status: DC | PRN
  Administered 2017-09-01: 50 ug via INTRAVENOUS
  Filled 2017-09-01: qty 2

## 2017-09-01 MED ORDER — SODIUM CHLORIDE 0.9 % IR SOLN
Status: DC | PRN
  Administered 2017-09-01: 1000 mL

## 2017-09-01 MED ORDER — FENTANYL CITRATE (PF) 100 MCG/2ML IJ SOLN
INTRAMUSCULAR | Status: DC | PRN
  Administered 2017-09-01 (×3): 50 ug via INTRAVENOUS

## 2017-09-01 MED ORDER — POVIDONE-IODINE 10 % EX OINT
TOPICAL_OINTMENT | CUTANEOUS | Status: AC
Start: 2017-09-01 — End: ?
  Filled 2017-09-01: qty 1

## 2017-09-01 MED ORDER — SUGAMMADEX SODIUM 500 MG/5ML IV SOLN
INTRAVENOUS | Status: AC
  Filled 2017-09-01: qty 5

## 2017-09-01 MED ORDER — BUPIVACAINE LIPOSOME 1.3 % IJ SUSP
INTRAMUSCULAR | Status: DC | PRN
  Administered 2017-09-01: 08:00:00

## 2017-09-01 MED ORDER — KETOROLAC TROMETHAMINE 30 MG/ML IJ SOLN
INTRAMUSCULAR | Status: AC
  Filled 2017-09-01: qty 1

## 2017-09-01 MED ORDER — ONDANSETRON HCL 4 MG/2ML IJ SOLN
4.0000 mg | Freq: Once | INTRAMUSCULAR | Status: AC
  Administered 2017-09-01: 4 mg via INTRAVENOUS

## 2017-09-01 MED ORDER — LIDOCAINE HCL (PF) 1 % IJ SOLN
INTRAMUSCULAR | Status: AC
  Filled 2017-09-01: qty 5

## 2017-09-01 MED ORDER — KETOROLAC TROMETHAMINE 30 MG/ML IJ SOLN
30.0000 mg | Freq: Once | INTRAMUSCULAR | Status: AC
  Administered 2017-09-01: 30 mg via INTRAVENOUS

## 2017-09-01 MED ORDER — BUPIVACAINE LIPOSOME 1.3 % IJ SUSP
INTRAMUSCULAR | Status: AC
  Filled 2017-09-01: qty 20

## 2017-09-01 MED ORDER — SUGAMMADEX SODIUM 200 MG/2ML IV SOLN
INTRAVENOUS | Status: DC | PRN
  Administered 2017-09-01: 146 mg via INTRAVENOUS

## 2017-09-01 MED ORDER — OXYCODONE HCL 5 MG PO TABS: 5.0000 mg | ORAL_TABLET | Freq: Three times a day (TID) | ORAL | 0 refills | Status: DC | PRN

## 2017-09-01 MED ORDER — SUGAMMADEX SODIUM 200 MG/2ML IV SOLN
INTRAVENOUS | Status: AC
  Filled 2017-09-01: qty 2

## 2017-09-01 SURGICAL SUPPLY — 49 items
APPLICATOR ARISTA FLEXITIP XL (MISCELLANEOUS) ×2 IMPLANT
APPLIER CLIP ROT 10 11.4 M/L (STAPLE) ×2
BAG HAMPER (MISCELLANEOUS) ×2 IMPLANT
BAG RETRIEVAL 10 (BASKET) ×1
CHLORAPREP W/TINT 26ML (MISCELLANEOUS) ×2 IMPLANT
CLIP APPLIE ROT 10 11.4 M/L (STAPLE) ×1 IMPLANT
CLOTH BEACON ORANGE TIMEOUT ST (SAFETY) ×2 IMPLANT
COVER LIGHT HANDLE STERIS (MISCELLANEOUS) ×4 IMPLANT
DECANTER SPIKE VIAL GLASS SM (MISCELLANEOUS) ×2 IMPLANT
ELECT REM PT RETURN 9FT ADLT (ELECTROSURGICAL) ×2
ELECTRODE REM PT RTRN 9FT ADLT (ELECTROSURGICAL) ×1 IMPLANT
FILTER SMOKE EVAC LAPAROSHD (FILTER) ×2 IMPLANT
GLOVE BIOGEL PI IND STRL 6.5 (GLOVE) ×2 IMPLANT
GLOVE BIOGEL PI IND STRL 7.0 (GLOVE) ×1 IMPLANT
GLOVE BIOGEL PI IND STRL 7.5 (GLOVE) ×2 IMPLANT
GLOVE BIOGEL PI INDICATOR 6.5 (GLOVE) ×2
GLOVE BIOGEL PI INDICATOR 7.0 (GLOVE) ×1
GLOVE BIOGEL PI INDICATOR 7.5 (GLOVE) ×2
GLOVE ECLIPSE 6.5 STRL STRAW (GLOVE) ×4 IMPLANT
GLOVE SURG SS PI 7.5 STRL IVOR (GLOVE) ×2 IMPLANT
GOWN STRL REUS W/ TWL XL LVL3 (GOWN DISPOSABLE) ×1 IMPLANT
GOWN STRL REUS W/TWL LRG LVL3 (GOWN DISPOSABLE) ×4 IMPLANT
GOWN STRL REUS W/TWL XL LVL3 (GOWN DISPOSABLE) ×1
HEMOSTAT ARISTA ABSORB 3G PWDR (MISCELLANEOUS) ×2 IMPLANT
HEMOSTAT SNOW SURGICEL 2X4 (HEMOSTASIS) ×2 IMPLANT
INST SET LAPROSCOPIC AP (KITS) ×2 IMPLANT
IV NS IRRIG 3000ML ARTHROMATIC (IV SOLUTION) IMPLANT
KIT ROOM TURNOVER APOR (KITS) ×2 IMPLANT
MANIFOLD NEPTUNE II (INSTRUMENTS) ×2 IMPLANT
NEEDLE INSUFFLATION 14GA 120MM (NEEDLE) ×2 IMPLANT
NS IRRIG 1000ML POUR BTL (IV SOLUTION) ×2 IMPLANT
PACK LAP CHOLE LZT030E (CUSTOM PROCEDURE TRAY) ×2 IMPLANT
PAD ARMBOARD 7.5X6 YLW CONV (MISCELLANEOUS) ×2 IMPLANT
SET BASIN LINEN APH (SET/KITS/TRAYS/PACK) ×2 IMPLANT
SET TUBE IRRIG SUCTION NO TIP (IRRIGATION / IRRIGATOR) IMPLANT
SLEEVE ENDOPATH XCEL 5M (ENDOMECHANICALS) ×2 IMPLANT
SPONGE GAUZE 2X2 8PLY STRL LF (GAUZE/BANDAGES/DRESSINGS) ×8 IMPLANT
STAPLER VISISTAT (STAPLE) ×2 IMPLANT
SUT VICRYL 0 UR6 27IN ABS (SUTURE) ×2 IMPLANT
SYRINGE 60CC LL (MISCELLANEOUS) ×2 IMPLANT
SYS BAG RETRIEVAL 10MM (BASKET) ×1
SYSTEM BAG RETRIEVAL 10MM (BASKET) ×1 IMPLANT
TAPE HYPAFIX 6X30 (GAUZE/BANDAGES/DRESSINGS) ×2 IMPLANT
TROCAR ENDO BLADELESS 11MM (ENDOMECHANICALS) ×2 IMPLANT
TROCAR XCEL NON-BLD 5MMX100MML (ENDOMECHANICALS) ×2 IMPLANT
TROCAR XCEL UNIV SLVE 11M 100M (ENDOMECHANICALS) ×2 IMPLANT
TUBE CONNECTING 12X1/4 (SUCTIONS) ×2 IMPLANT
TUBING INSUFFLATION (TUBING) ×2 IMPLANT
WARMER LAPAROSCOPE (MISCELLANEOUS) ×2 IMPLANT

## 2017-09-01 NOTE — Anesthesia Postprocedure Evaluation (Signed)
Anesthesia Post Note  Patient: Thomas Pratt  Procedure(s) Performed: LAPAROSCOPIC CHOLECYSTECTOMY (N/A )  Patient location during evaluation: PACU Anesthesia Type: General Level of consciousness: awake and alert and oriented Pain management: pain level controlled Vital Signs Assessment: post-procedure vital signs reviewed and stable Respiratory status: spontaneous breathing and respiratory function stable Cardiovascular status: stable Postop Assessment: no apparent nausea or vomiting Anesthetic complications: no     Last Vitals:  Vitals:   09/01/17 0725 09/01/17 0822  BP:  135/90  Pulse:  73  Resp: (!) 27 13  Temp:  36.4 C  SpO2: 98% 100%    Last Pain: There were no vitals filed for this visit.               ADAMS, AMY A

## 2017-09-01 NOTE — Anesthesia Preprocedure Evaluation (Signed)
Anesthesia Evaluation  Patient identified by MRN, date of birth, ID band Patient awake    Reviewed: Allergy & Precautions, NPO status , Patient's Chart, lab work & pertinent test results  Airway Mallampati: II  TM Distance: >3 FB Neck ROM: Full    Dental  (+) Poor Dentition, Missing, Chipped, Dental Advisory Given   Pulmonary neg pulmonary ROS,    breath sounds clear to auscultation       Cardiovascular hypertension, Pt. on medications  Rhythm:Regular Rate:Normal     Neuro/Psych negative neurological ROS     GI/Hepatic negative GI ROS, GERD  Medicated and Controlled,(+) Cirrhosis   ascites  substance abuse  alcohol use,   Endo/Other  diabetes, Type 2  Renal/GU      Musculoskeletal   Abdominal   Peds  Hematology negative hematology ROS (+)   Anesthesia Other Findings   Reproductive/Obstetrics                             Anesthesia Physical Anesthesia Plan  ASA: III  Anesthesia Plan: General   Post-op Pain Management:    Induction: Intravenous  PONV Risk Score and Plan:   Airway Management Planned: Oral ETT  Additional Equipment:   Intra-op Plan:   Post-operative Plan: Extubation in OR  Informed Consent: I have reviewed the patients History and Physical, chart, labs and discussed the procedure including the risks, benefits and alternatives for the proposed anesthesia with the patient or authorized representative who has indicated his/her understanding and acceptance.   Dental advisory given  Plan Discussed with:   Anesthesia Plan Comments:         Anesthesia Quick Evaluation

## 2017-09-01 NOTE — Anesthesia Procedure Notes (Signed)
Procedure Name: Intubation Date/Time: 09/01/2017 7:36 AM Performed by: Andree Elk, Ashrita Chrismer A, CRNA Pre-anesthesia Checklist: Patient identified, Patient being monitored, Timeout performed, Emergency Drugs available and Suction available Patient Re-evaluated:Patient Re-evaluated prior to induction Oxygen Delivery Method: Circle System Utilized Preoxygenation: Pre-oxygenation with 100% oxygen Induction Type: IV induction Ventilation: Mask ventilation without difficulty Laryngoscope Size: Miller and 3 Grade View: Grade I Tube type: Oral Tube size: 7.0 mm Number of attempts: 1 Airway Equipment and Method: Stylet Placement Confirmation: ETT inserted through vocal cords under direct vision,  positive ETCO2 and breath sounds checked- equal and bilateral Secured at: 21 cm Tube secured with: Tape Dental Injury: Teeth and Oropharynx as per pre-operative assessment

## 2017-09-01 NOTE — Discharge Instructions (Signed)
Laparoscopic Cholecystectomy, Care After °This sheet gives you information about how to care for yourself after your procedure. Your health care provider may also give you more specific instructions. If you have problems or questions, contact your health care provider. °What can I expect after the procedure? °After the procedure, it is common to have: °· Pain at your incision sites. You will be given medicines to control this pain. °· Mild nausea or vomiting. °· Bloating and possible shoulder pain from the air-like gas that was used during the procedure. ° °Follow these instructions at home: °Incision care ° °· Follow instructions from your health care provider about how to take care of your incisions. Make sure you: °? Wash your hands with soap and water before you change your bandage (dressing). If soap and water are not available, use hand sanitizer. °? Change your dressing as told by your health care provider. °? Leave stitches (sutures), skin glue, or adhesive strips in place. These skin closures may need to be in place for 2 weeks or longer. If adhesive strip edges start to loosen and curl up, you may trim the loose edges. Do not remove adhesive strips completely unless your health care provider tells you to do that. °· Do not take baths, swim, or use a hot tub until your health care provider approves. Ask your health care provider if you can take showers. You may only be allowed to take sponge baths for bathing. °· Check your incision area every day for signs of infection. Check for: °? More redness, swelling, or pain. °? More fluid or blood. °? Warmth. °? Pus or a bad smell. °Activity °· Do not drive or use heavy machinery while taking prescription pain medicine. °· Do not lift anything that is heavier than 10 lb (4.5 kg) until your health care provider approves. °· Do not play contact sports until your health care provider approves. °· Do not drive for 24 hours if you were given a medicine to help you relax  (sedative). °· Rest as needed. Do not return to work or school until your health care provider approves. °General instructions °· Take over-the-counter and prescription medicines only as told by your health care provider. °· To prevent or treat constipation while you are taking prescription pain medicine, your health care provider may recommend that you: °? Drink enough fluid to keep your urine clear or pale yellow. °? Take over-the-counter or prescription medicines. °? Eat foods that are high in fiber, such as fresh fruits and vegetables, whole grains, and beans. °? Limit foods that are high in fat and processed sugars, such as fried and sweet foods. °Contact a health care provider if: °· You develop a rash. °· You have more redness, swelling, or pain around your incisions. °· You have more fluid or blood coming from your incisions. °· Your incisions feel warm to the touch. °· You have pus or a bad smell coming from your incisions. °· You have a fever. °· One or more of your incisions breaks open. °Get help right away if: °· You have trouble breathing. °· You have chest pain. °· You have increasing pain in your shoulders. °· You faint or feel dizzy when you stand. °· You have severe pain in your abdomen. °· You have nausea or vomiting that lasts for more than one day. °· You have leg pain. °This information is not intended to replace advice given to you by your health care provider. Make sure you discuss any questions you   have with your health care provider. °Document Released: 07/08/2005 Document Revised: 01/27/2016 Document Reviewed: 12/25/2015 °Elsevier Interactive Patient Education © 2018 [employer] ° ° °General Anesthesia, Adult, Care After °These instructions provide you with information about caring for yourself after your procedure. Your health care provider may also give you more specific instructions. Your treatment has been planned according to current medical practices, but problems sometimes occur.  Call your health care provider if you have any problems or questions after your procedure. °What can I expect after the procedure? °After the procedure, it is common to have: °· Vomiting. °· A sore throat. °· Mental slowness. ° °It is common to feel: °· Nauseous. °· Cold or shivery. °· Sleepy. °· Tired. °· Sore or achy, even in parts of your body where you did not have surgery. ° °Follow these instructions at home: °For at least 24 hours after the procedure: °· Do not: °? Participate in activities where you could fall or become injured. °? Drive. °? Use heavy machinery. °? Drink alcohol. °? Take sleeping pills or medicines that cause drowsiness. °? Make important decisions or sign legal documents. °? Take care of children on your own. °· Rest. °Eating and drinking °· If you vomit, drink water, juice, or soup when you can drink without vomiting. °· Drink enough fluid to keep your urine clear or pale yellow. °· Make sure you have little or no nausea before eating solid foods. °· Follow the diet recommended by your health care provider. °General instructions °· Have a responsible adult stay with you until you are awake and alert. °· Return to your normal activities as told by your health care provider. Ask your health care provider what activities are safe for you. °· Take over-the-counter and prescription medicines only as told by your health care provider. °· If you smoke, do not smoke without supervision. °· Keep all follow-up visits as told by your health care provider. This is important. °Contact a health care provider if: °· You continue to have nausea or vomiting at home, and medicines are not helpful. °· You cannot drink fluids or start eating again. °· You cannot urinate after 8-12 hours. °· You develop a skin rash. °· You have fever. °· You have increasing redness at the site of your procedure. °Get help right away if: °· You have difficulty breathing. °· You have chest pain. °· You have unexpected  bleeding. °· You feel that you are having a life-threatening or urgent problem. °This information is not intended to replace advice given to you by your health care provider. Make sure you discuss any questions you have with your health care provider. °Document Released: 10/14/2000 Document Revised: 12/11/2015 Document Reviewed: 06/22/2015 °Elsevier Interactive Patient Education © 2018 [employer] ° °

## 2017-09-01 NOTE — Op Note (Signed)
Patient:  Thomas Pratt  DOB:  [birth year]  MRN:  [id number]   Preop Diagnosis: Biliary colic, cholelithiasis  Postop Diagnosis: Same  Procedure: Laparoscopic cholecystectomy  Surgeon: Aviva Signs, MD  Assistant: Blake Divine, MD  Anes: General endotracheal  Indications: Patient is a 57 year old male with a history of cirrhosis who presents with biliary colic secondary to cholelithiasis.  The risks and benefits of the procedure including bleeding, infection, hepatobiliary injury, and the possibility of an open procedure were fully explained to the patient, who gave informed consent.  Procedure note: The patient was placed in the supine position.  After induction of general endotracheal anesthesia, the abdomen was prepped and draped using the usual sterile technique with DuraPrep.  Surgical site confirmation was performed.  A supraumbilical incision was made down to the fascia.  A Veress needle was introduced into the abdominal cavity and confirmation of placement was done using the saline drop test.  The abdomen was then insufflated to 16 mmHg pressure.  An 11 mm trocar was introduced into the abdominal cavity under direct visualization without difficulty.  The patient was placed in reverse Trendelenburg position and an additional 11 mm trocar was placed in the epigastric region and 5 mm trochars were placed in the right upper quadrant and right flank regions.  The liver was inspected and noted to be scarred and cirrhotic in nature.  Minimal ascites was present.  The gallbladder was retracted in a dynamic fashion in order to provide a critical view of the triangle of Calot.  The cystic duct was first identified.  Its juncture to the infundibulum was fully identified.  Endoclips were placed proximally and distally on the cystic duct, and the cystic duct was divided.  This was likewise done to the cystic artery.  The gallbladder was freed away from the gallbladder fossa using Bovie  electrocautery.  The gallbladder was delivered through the epigastric trocar site using an Endo Catch bag.  The gallbladder fossa was inspected and no abnormal bleeding or bile leakage was noted.  Arista and Surgicel were placed in the gallbladder fossa.  All fluid and air were then evacuated from the abdominal cavity prior to the removal of the trochars.  All wounds were irrigated with normal saline.  All wounds were injected with Exparel.  The epigastric fascia as well as supraumbilical fascia was reapproximated using 0 Vicryl interrupted sutures.  All skin incisions were closed using staples.  Betadine ointment and dry sterile dressings were applied.  All tape and needle counts were correct at the end of the procedure.  Patient was extubated in the operating room and transferred to PACU in stable condition.  Complications: None  EBL: Minimal  Specimen: Gallbladder

## 2017-09-01 NOTE — Interval H&P Note (Signed)
History and Physical Interval Note:  09/01/2017 7:12 AM  Thomas Pratt  has presented today for surgery, with the diagnosis of cholelithiasis  The various methods of treatment have been discussed with the patient and family. After consideration of risks, benefits and other options for treatment, the patient has consented to  Procedure(s): LAPAROSCOPIC CHOLECYSTECTOMY (N/A) as a surgical intervention .  The patient's history has been reviewed, patient examined, no change in status, stable for surgery.  I have reviewed the patient's chart and labs.  Questions were answered to the patient's satisfaction.     Aviva Signs

## 2017-09-01 NOTE — Transfer of Care (Signed)
Immediate Anesthesia Transfer of Care Note  Patient: Thomas Pratt  Procedure(s) Performed: LAPAROSCOPIC CHOLECYSTECTOMY (N/A )  Patient Location: PACU  Anesthesia Type:General  Level of Consciousness: awake, alert , oriented and patient cooperative  Airway & Oxygen Therapy: Patient Spontanous Breathing  Post-op Assessment: Report given to RN and Post -op Vital signs reviewed and stable  Post vital signs: Reviewed and stable  Last Vitals:  Vitals:   09/01/17 0720 09/01/17 0725  BP: 106/65   Resp: 16 (!) 27  Temp:    SpO2: 98% 98%    Last Pain: There were no vitals filed for this visit.       Complications: No apparent anesthesia complications

## 2017-09-02 ENCOUNTER — Encounter (HOSPITAL_COMMUNITY): Payer: Self-pay | Admitting: General Surgery

## 2017-09-11 ENCOUNTER — Encounter: Payer: Self-pay | Admitting: General Surgery

## 2017-09-11 ENCOUNTER — Ambulatory Visit (INDEPENDENT_AMBULATORY_CARE_PROVIDER_SITE_OTHER): Payer: Self-pay | Admitting: General Surgery

## 2017-09-11 VITALS — BP 113/68 | HR 95 | Temp 98.4°F | Ht 68.0 in | Wt 159.0 lb

## 2017-09-11 DIAGNOSIS — Z09 Encounter for follow-up examination after completed treatment for conditions other than malignant neoplasm: Secondary | ICD-10-CM

## 2017-09-11 NOTE — Progress Notes (Signed)
Subjective:     Thomas Pratt  Status post laparoscopic cholecystectomy.  Doing well.  Has mild incisional pain.  Denies any nausea, vomiting, fever, or chills. Objective:    BP 113/68   Pulse 95   Temp 98.4 F (36.9 C)   Ht 5\' 8"  (1.727 m)   Wt 159 lb (72.1 kg)   BMI 24.18 kg/m   General:  alert, cooperative and no distress  Abdomen soft, incisions healing well.  Staples removed, Steri-Strips applied. Final pathology consistent with diagnosis.     Assessment:    Doing well postoperatively.    Plan:   Increase activity as able.  Follow-up here as needed.

## 2017-09-16 ENCOUNTER — Ambulatory Visit (INDEPENDENT_AMBULATORY_CARE_PROVIDER_SITE_OTHER): Payer: Medicaid Other | Admitting: Gastroenterology

## 2017-09-16 ENCOUNTER — Encounter: Payer: Self-pay | Admitting: Gastroenterology

## 2017-09-16 ENCOUNTER — Ambulatory Visit: Payer: Self-pay | Admitting: Gastroenterology

## 2017-09-16 VITALS — BP 112/74 | HR 74 | Temp 97.0°F | Ht 68.0 in | Wt 156.6 lb

## 2017-09-16 DIAGNOSIS — R945 Abnormal results of liver function studies: Secondary | ICD-10-CM

## 2017-09-16 DIAGNOSIS — K7031 Alcoholic cirrhosis of liver with ascites: Secondary | ICD-10-CM | POA: Diagnosis not present

## 2017-09-16 DIAGNOSIS — R7989 Other specified abnormal findings of blood chemistry: Secondary | ICD-10-CM

## 2017-09-16 NOTE — Progress Notes (Signed)
Primary Care Physician: Patient, No Pcp Per  Primary Gastroenterologist:  Garfield Cornea, MD   Chief Complaint  Patient presents with  . Cirrhosis  . Abdominal Pain    had gallbladder removed; upper abd pain is better; still having some abd pain    HPI: Thomas Pratt is a 57 y.o. male here for follow up. H/o decompensated cirrhosis and alcoholic hepatitis in October 2017 but quit drinking in September 2017. Last seen in 07/2017. He has since had his gallbladder removed. He is scheduled to see Dr. Mannie Stabile for elevated glucose over 300 last month.   Last colonoscopy and upper endoscopy last year, he had grade 1 esophageal varices, portal gastropathy and next TCS in 5 years if health permits.   He has h/o positive AMA and ASMA and had transjugular liver biopsy planned but cancelled due to ED visit for abd pain last month. Recent cholecystectomy but no liver biopsy performed.   Since his gallbladder was removed he has had improvement/resolution of upper abd pain. Still complains of pain in rlq at site of hernia repair performed 06/2017. BM regular. No melena, brbpr. Did well postoperatively. Denies n/v, heartburn.   CT 07/2017 with no hepatoma. Labs up to date. Alk phos remains elevated along with ALT.   Current Outpatient Medications  Medication Sig Dispense Refill  . furosemide (LASIX) 40 MG tablet Take 1 tablet (40 mg total) by mouth daily. 30 tablet 5  . metFORMIN (GLUCOPHAGE) 500 MG tablet Take 1 tablet (500 mg total) by mouth 2 (two) times daily with a meal. 60 tablet 0  . nadolol (CORGARD) 40 MG tablet Take 1 tablet (40 mg total) by mouth daily. And 1/2 tablet in evening (Patient taking differently: Take 20-40 mg by mouth See admin instructions. Take 40 mg in the morning And 1/2 tablet (20 mg) in evening) 60 tablet 3  . pantoprazole (PROTONIX) 40 MG tablet Take 40 mg by mouth daily.    Marland Kitchen spironolactone (ALDACTONE) 100 MG tablet TAKE 1 TABLET BY MOUTH EVERY DAY 30 tablet 5    No current facility-administered medications for this visit.     Allergies as of 09/16/2017  . (No Known Allergies)    ROS:  General: Negative for anorexia, weight loss, fever, chills, fatigue, weakness. ENT: Negative for hoarseness, difficulty swallowing , nasal congestion. CV: Negative for chest pain, angina, palpitations, dyspnea on exertion, peripheral edema.  Respiratory: Negative for dyspnea at rest, dyspnea on exertion, cough, sputum, wheezing.  GI: See history of present illness. GU:  Negative for dysuria, hematuria, urinary incontinence, urinary frequency, nocturnal urination.  Endo: Negative for unusual weight change.    Physical Examination:   BP 112/74   Pulse 74   Temp (!) 97 F (36.1 C) (Oral)   Ht _0  (1.727 m)   Wt 156 lb 9.6 oz (71 kg)   BMI 23.81 kg/m   General: Well-nourished, well-developed in no acute distress.  Eyes: No icterus. Mouth: Oropharyngeal mucosa moist and pink , no lesions erythema or exudate. Lungs: Clear to auscultation bilaterally.  Heart: Regular rate and rhythm, no murmurs rubs or gallops.  Abdomen: Bowel sounds are normal, well healed incisions, mild rlq tenderness, nondistended, no hepatosplenomegaly or masses, no abdominal bruits or hernia , no rebound or guarding.   Extremities: No lower extremity edema. No clubbing or deformities. Neuro: Alert and oriented x 4   Skin: Warm and dry, no jaundice.   Psych: Alert and cooperative, normal mood and affect.  Labs:  Lab Results  Component Value Date   CREATININE 0.86 08/28/2017   BUN 13 08/28/2017   NA 131 (L) 08/28/2017   K 4.1 08/28/2017   CL 95 (L) 08/28/2017   CO2 25 08/28/2017   Lab Results  Component Value Date   ALT 62 08/28/2017   AST 60 (H) 08/28/2017   ALKPHOS 167 (H) 08/28/2017   BILITOT 1.0 08/28/2017   Lab Results  Component Value Date   WBC 8.6 08/28/2017   HGB 14.2 08/28/2017   HCT 41.7 08/28/2017   MCV 95.4 08/28/2017   PLT 260 08/28/2017   Lab  Results  Component Value Date   INR 1.00 08/28/2017   INR 1.10 07/18/2017   INR 1.09 04/25/2017    Imaging Studies: No results found.

## 2017-09-16 NOTE — Assessment & Plan Note (Signed)
57 y/o male with h/o cirrhosis secondary to ETOH, quit etoh 2017. Recovering from right inguinal hernia repair in 06/2017 and recent cholecystectomy. Liver imaging up to date. Labs up to date. He abd pain is improved but with some rlq pain persisting at site of inguinal hernia. No significant ascites on exam. Will continue to monitor for now but if does not continue to improve then he will f/u with Dr. Arnoldo Morale.   He has positive AMA, ASMA. No liver biopsy to date. Will allow some time to recover and consider in the next couple of months. Return to the office in 3 months.

## 2017-09-16 NOTE — Patient Instructions (Signed)
1. We will plan on liver biopsy in a few months. But for now I want to let you have time to fully recover from your two surgeries.  2. We will plan on updating labs and ultrasound in July or August. We will let you know.  3. Return to the office in 3 months. Call sooner if any problems.

## 2017-09-17 NOTE — Progress Notes (Signed)
No pcp per patient 

## 2017-09-23 ENCOUNTER — Telehealth: Payer: Self-pay | Admitting: Internal Medicine

## 2017-09-23 NOTE — Telephone Encounter (Signed)
Pt had CT abd/pelvis 08/10/17 and US abdomen 06/2017. Does he need [place] in April?

## 2017-09-23 NOTE — Telephone Encounter (Signed)
RECALL FOR ULTRASOUND 

## 2017-09-23 NOTE — Telephone Encounter (Signed)
No. We can arrange this at his next visit in 3 months.

## 2017-09-23 NOTE — Telephone Encounter (Signed)
Noted  

## 2017-09-26 ENCOUNTER — Emergency Department (HOSPITAL_COMMUNITY)
Admission: EM | Admit: 2017-09-26 | Discharge: 2017-09-26 | Disposition: A | Discharge status: Home / Self Care | Payer: Medicaid Other | Attending: Emergency Medicine | Admitting: Emergency Medicine

## 2017-09-26 ENCOUNTER — Other Ambulatory Visit: Payer: Self-pay

## 2017-09-26 ENCOUNTER — Ambulatory Visit (INDEPENDENT_AMBULATORY_CARE_PROVIDER_SITE_OTHER): Payer: Medicaid Other | Admitting: Family Medicine

## 2017-09-26 ENCOUNTER — Encounter: Payer: Self-pay | Admitting: Family Medicine

## 2017-09-26 ENCOUNTER — Encounter (HOSPITAL_COMMUNITY): Payer: Self-pay

## 2017-09-26 ENCOUNTER — Emergency Department (HOSPITAL_COMMUNITY): Payer: Medicaid Other

## 2017-09-26 VITALS — BP 140/70 | HR 87 | Temp 98.1°F | Resp 16 | Ht 68.0 in | Wt 160.0 lb

## 2017-09-26 DIAGNOSIS — E119 Type 2 diabetes mellitus without complications: Secondary | ICD-10-CM | POA: Insufficient documentation

## 2017-09-26 DIAGNOSIS — Z79899 Other long term (current) drug therapy: Secondary | ICD-10-CM | POA: Diagnosis not present

## 2017-09-26 DIAGNOSIS — F419 Anxiety disorder, unspecified: Secondary | ICD-10-CM

## 2017-09-26 DIAGNOSIS — R5383 Other fatigue: Secondary | ICD-10-CM

## 2017-09-26 DIAGNOSIS — Z23 Encounter for immunization: Secondary | ICD-10-CM | POA: Diagnosis not present

## 2017-09-26 DIAGNOSIS — Z7984 Long term (current) use of oral hypoglycemic drugs: Secondary | ICD-10-CM | POA: Diagnosis not present

## 2017-09-26 DIAGNOSIS — R1084 Generalized abdominal pain: Secondary | ICD-10-CM | POA: Diagnosis not present

## 2017-09-26 DIAGNOSIS — R1033 Periumbilical pain: Secondary | ICD-10-CM | POA: Diagnosis present

## 2017-09-26 DIAGNOSIS — I10 Essential (primary) hypertension: Secondary | ICD-10-CM | POA: Diagnosis not present

## 2017-09-26 DIAGNOSIS — F1010 Alcohol abuse, uncomplicated: Secondary | ICD-10-CM

## 2017-09-26 LAB — CBC WITH DIFFERENTIAL/PLATELET
Basophils Absolute: 0 10*3/uL (ref 0.0–0.1)
Basophils Relative: 0 %
Eosinophils Absolute: 0.1 10*3/uL (ref 0.0–0.7)
Eosinophils Relative: 1 %
HEMATOCRIT: 42.8 % (ref 39.0–52.0)
HEMOGLOBIN: 14.7 g/dL (ref 13.0–17.0)
LYMPHS ABS: 1.7 10*3/uL (ref 0.7–4.0)
LYMPHS PCT: 15 %
MCH: 32.6 pg (ref 26.0–34.0)
MCHC: 34.3 g/dL (ref 30.0–36.0)
MCV: 94.9 fL (ref 78.0–100.0)
MONO ABS: 0.7 10*3/uL (ref 0.1–1.0)
MONOS PCT: 6 %
NEUTROS ABS: 9.4 10*3/uL — AB (ref 1.7–7.7)
NEUTROS PCT: 78 %
Platelets: 334 10*3/uL (ref 150–400)
RBC: 4.51 MIL/uL (ref 4.22–5.81)
RDW: 13.1 % (ref 11.5–15.5)
WBC: 12 10*3/uL — ABNORMAL HIGH (ref 4.0–10.5)

## 2017-09-26 LAB — COMPREHENSIVE METABOLIC PANEL
ALBUMIN: 4 g/dL (ref 3.5–5.0)
ALT: 58 U/L (ref 17–63)
ANION GAP: 12 (ref 5–15)
AST: 59 U/L — AB (ref 15–41)
Alkaline Phosphatase: 266 U/L — ABNORMAL HIGH (ref 38–126)
BILIRUBIN TOTAL: 1 mg/dL (ref 0.3–1.2)
BUN: 15 mg/dL (ref 6–20)
CO2: 25 mmol/L (ref 22–32)
Calcium: 10.5 mg/dL — ABNORMAL HIGH (ref 8.9–10.3)
Chloride: 97 mmol/L — ABNORMAL LOW (ref 101–111)
Creatinine, Ser: 0.98 mg/dL (ref 0.61–1.24)
GFR calc Af Amer: >60 mL/min (ref >60)
GFR calc non Af Amer: >60 mL/min (ref >60)
GLUCOSE: 143 mg/dL — AB (ref 65–99)
POTASSIUM: 4.7 mmol/L (ref 3.5–5.1)
SODIUM: 134 mmol/L — AB (ref 135–145)
TOTAL PROTEIN: 9.7 g/dL — AB (ref 6.5–8.1)

## 2017-09-26 LAB — URINALYSIS, ROUTINE W REFLEX MICROSCOPIC
BILIRUBIN URINE: NEGATIVE
Glucose, UA: NEGATIVE mg/dL
HGB URINE DIPSTICK: NEGATIVE
KETONES UR: NEGATIVE mg/dL
Leukocytes, UA: NEGATIVE
Nitrite: NEGATIVE
PROTEIN: NEGATIVE mg/dL
Specific Gravity, Urine: 1.018 (ref 1.005–1.030)
pH: 5 (ref 5.0–8.0)

## 2017-09-26 LAB — LIPASE, BLOOD: Lipase: 34 U/L (ref 11–51)

## 2017-09-26 MED ORDER — IOPAMIDOL (ISOVUE-300) INJECTION 61%
100.0000 mL | Freq: Once | INTRAVENOUS | Status: AC | PRN
  Administered 2017-09-26: 100 mL via INTRAVENOUS

## 2017-09-26 MED ORDER — ONDANSETRON HCL 4 MG/2ML IJ SOLN
4.0000 mg | Freq: Once | INTRAMUSCULAR | Status: AC
  Administered 2017-09-26: 4 mg via INTRAVENOUS
  Filled 2017-09-26: qty 2

## 2017-09-26 MED ORDER — SODIUM CHLORIDE 0.9 % IV SOLN
INTRAVENOUS | Status: DC
  Administered 2017-09-26: 18:00:00 via INTRAVENOUS

## 2017-09-26 MED ORDER — FENTANYL CITRATE (PF) 100 MCG/2ML IJ SOLN
50.0000 ug | Freq: Once | INTRAMUSCULAR | Status: AC
  Administered 2017-09-26: 50 ug via INTRAVENOUS
  Filled 2017-09-26: qty 2

## 2017-09-26 MED ORDER — LISINOPRIL 5 MG PO TABS: 5.0000 mg | ORAL_TABLET | Freq: Every day | ORAL | 3 refills | Status: DC

## 2017-09-26 NOTE — Progress Notes (Signed)
Patient ID: Thomas Pratt, male    DOB: 09-19-1960, 57 y.o.   MRN: [id number]  Chief Complaint  Patient presents with  . Establish Care    Allergies Patient has no known allergies.  Subjective:   Thomas Pratt is a 57 y.o. male who presents to [facility] today.  HPI   [name] presents today as a new patient visit to establish care.  He was referred to our office by gastroenterology and surgery.  He recently had a cholecystectomy performed and is recovering well.  He has a past history significant for alcohol abuse complicated now by his cirrhosis.  He has been sober for 17 months.  He is followed by GI for his cirrhosis and continued elevated liver functions.  He is on spironolactone and nadolol.  He reports compliance with these medications.  Reports he has felt a little bit stressed over the past couple weeks.  Has been discharged from the hospital and is staying with friends because he does not have a home of his own.  He reports that he has been worried about bills and finances.  He reports that he occasionally feels sad and down.  He does not feel hopeless or depressed.  He denies any recent alcohol use.  He reports he has never done any drugs or even smoke cigarettes.  He reports that he has had times where he feels anxious and like he needs to take a deep breath.  He denies any shortness of breath or dyspnea on exertion.  He does not wake up at night short of breath.  He denies any edema in his lower extremities.  He denies any pain in his legs with ambulation.   Reports that he does have some baseline fatigue which she was told was associated with his cirrhosis.  He reports that over the past couple weeks that his whole body is felt achy and sore.  He has had no discrete edema or swelling in extremities or joints.  He has had no red this or skin changes.  He has not had any rashes.  He has been eating well.  Reports his weight has been stable.  He reports  he did lose a few pounds when he was in the hospital but he is gained it back.  He does not always have enough money to buy food but he reports that his kids will give him money at some times.  He also knows of several shelters in the area where he is able to go and get food if needed.  He reports he does not like to do this because it makes him feel guilty because he is not able to give them payment.  He reports he is trying to get disability at this time.  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  He does have an elevated blood pressure today.  He reports that it has been high in the past.  He denies any chest pain or shortness of breath.  No palpitations.  He denies ever having a heart attack or stroke.  He reports when he was in the hospital they did a test on his blood and told him that he was diabetic.  He reports that at that time he was placed on metformin.  He reports he has been compliant with taking that medication.      Past Medical History:  Diagnosis Date  .  Cirrhosis ([place])    likely ETOH use     Past Surgical History:  Procedure Laterality Date  . CHOLECYSTECTOMY N/A 09/01/2017   Procedure: LAPAROSCOPIC CHOLECYSTECTOMY;  Surgeon: Aviva Signs, MD;  Location: [facility];  Service: General;  Laterality: N/A;  . COLONOSCOPY WITH PROPOFOL N/A 03/06/2017   Dr. Gala Romney: tubular adenoma removed, next TCS 5 years if health permits.   . ESOPHAGOGASTRODUODENOSCOPY (EGD) WITH PROPOFOL N/A 03/06/2017   Dr. Gala Romney: grade 1 esophageal varices, erosive esophagitis, portal gastropathy.   Marland Kitchen HERNIA REPAIR Right    RIH  . INGUINAL HERNIA REPAIR Right 07/11/2017   Procedure: HERNIA REPAIR INGUINAL ADULT WITH MESH;  Surgeon: Aviva Signs, MD;  Location: [facility];  Service: General;  Laterality: Right;  patient knows to arrive at 7:30  . POLYPECTOMY  03/06/2017   Procedure: POLYPECTOMY;  Surgeon: Daneil Dolin, MD;  Location: [facility]  SU[facility];  Service: Endoscopy;;  ascending colon;    Family History  Problem Relation Age of Onset  . COPD Mother   . Liver disease Neg Hx   . Colon cancer Neg Hx   . Colon polyps Neg Hx      Social History   Socioeconomic History  . Marital status: Divorced    Spouse name: None  . Number of children: None  . Years of education: None  . Highest education level: None  Social Needs  . Financial resource strain: None  . Food insecurity - worry: None  . Food insecurity - inability: None  . Transportation needs - medical: None  . Transportation needs - non-medical: None  Occupational History  . Occupation: unemployed  Tobacco Use  . Smoking status: Never Smoker  . Smokeless tobacco: Never Used  Substance and Sexual Activity  . Alcohol use: No    Comment: Quit September 2017, heavy alcohol abuse previously  . Drug use: No  . Sexual activity: Not Currently    Partners: Female  Other Topics Concern  . None  Social History Narrative   Lives in [place], [place]. 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    Drank alcohol for years, quit 05/07/16. Has been sober since then. Quit b/c of health/cirrhosis.   Divorced for over 15 years.   Dating sporadically.   Has 3 daughters and 1 son live locally.   Originally from [place], moved here b/c of wife.    Eats all foods.    Wear seatbelt.   Does not drive, does not have a license, lost due to DWI.    Brought by neighbor.    Does not go to church. Believes in God. Prays each night, per report.    Current Outpatient Medications on File Prior to Visit  Medication Sig Dispense Refill  . furosemide (LASIX) 40 MG tablet Take 1 tablet (40 mg total) by mouth daily. 30 tablet 5  . nadolol (CORGARD) 40 MG tablet Take 1 tablet (40 mg total) by mouth daily. And 1/2 tablet in evening (Patient taking differently: Take 20-40 mg by mouth See admin instructions. Take 40 mg in the morning And 1/2 tablet (20 mg) in evening) 60 tablet 3  .  pantoprazole (PROTONIX) 40 MG tablet Take 40 mg by mouth daily.    Marland Kitchen spironolactone (ALDACTONE) 100 MG tablet TAKE 1 TABLET BY MOUTH EVERY DAY 30 tablet 5  . metFORMIN (GLUCOPHAGE) 500 MG tablet Take 1 tablet (500 mg total) by mouth 2 (two) times daily with a meal. 60 tablet 0   No current  facility-administered medications on file prior to visit.     Review of Systems  Constitutional: Positive for fatigue. Negative for activity change, appetite change, chills, diaphoresis, fever and unexpected weight change.  HENT: Negative for nosebleeds, postnasal drip, trouble swallowing and voice change.   Respiratory: Negative for cough, choking, chest tightness, shortness of breath, wheezing and stridor.   Cardiovascular: Negative for chest pain, palpitations and leg swelling.  Gastrointestinal: Negative for abdominal distention, abdominal pain, constipation, diarrhea, nausea and vomiting.  Genitourinary: Negative for dysuria, hematuria and urgency.  Musculoskeletal: Positive for arthralgias. Negative for neck pain and neck stiffness.  Skin: Negative for color change, pallor and rash.  Neurological: Negative for dizziness, tremors, speech difficulty, weakness, numbness and headaches.  Hematological: Negative for adenopathy. Does not bruise/bleed easily.  Psychiatric/Behavioral: Positive for dysphoric mood. Negative for agitation, behavioral problems, sleep disturbance and suicidal ideas. The patient is nervous/anxious.      Objective:   BP 140/70 (BP Location: Left Arm, Patient Position: Sitting, Cuff Size: Normal)   Pulse 87   Temp 98.1 F (36.7 C) (Temporal)   Resp 16   Ht 5\' 8"  (1.727 m)   Wt 160 lb (72.6 kg)   SpO2 96%   BMI 24.33 kg/m   Physical Exam  Constitutional: He is oriented to person, place, and time. He appears well-developed and well-nourished.  Thin white male with raspy voice.  No acute distress.  Appearance older than his stated age.  HENT:  Head: Normocephalic and  atraumatic.  Mouth/Throat: Oropharynx is clear and moist.  Poor dentition.  Eyes: Pupils are equal, round, and reactive to light. No scleral icterus.  Neck: Normal range of motion. Neck supple. No thyromegaly present.  Cardiovascular: Normal rate, regular rhythm and normal heart sounds.  Pulmonary/Chest: Effort normal and breath sounds normal. No respiratory distress.  Abdominal: Soft. Bowel sounds are normal. He exhibits no distension.  Neurological: He is alert and oriented to person, place, and time. No cranial nerve deficit.  Skin: Skin is warm. No rash noted.  Psychiatric: His behavior is normal. Judgment and thought content normal.  Mood slightly dysthymic.  Pleasant.  Affect congruent with mood but with full range.  Denies any suicidal or homicidal ideation.  No auditory or visual hallucinations.  Thought content goal directed without delusions, phobias, obsessions, or compulsions.  Insight and judgment good.  Vitals reviewed.  Depression screen [facility] 2/9 09/26/2017 09/26/2017  Decreased Interest 2 0  Down, Depressed, Hopeless 1 1  PHQ - 2 Score 3 1  Altered sleeping 1 -  Tired, decreased energy 2 -  Change in appetite 0 -  Feeling bad or failure about yourself  0 -  Moving slowly or fidgety/restless 1 -  Suicidal thoughts 0 -  PHQ-9 Score 7 -     Assessment and Plan  1. Type 2 diabetes mellitus without complication, without long-term current use of insulin ([place]) Patient with fairly new onset diabetes mellitus.  A1c is 6.5%.  He was started on metformin prior to coming into the office, several weeks ago.  Diabetic education and nutrition discussed with him today.  Due to his financial limitations he does not always have options for buying fresh fruit and vegetables. Plan to perform diabetic foot exam at next visit. Will refer for diabetic eye exam at subsequent visit. Check FLP today.  I doubt with patient's cirrhosis that he would be indicated for statin therapy secondary to  evidence of still elevated LFTs., but could initiate lifestyle modifications. - Basic metabolic  panel - Lipid panel  2. Essential hypertension Uncontrolled.  Start lisinopril at this time due to compelling indication of ACE inhibitor.  He is also on Spironolactone which is potassium sparing.  Patient will follow-up in 2-3 weeks and we will plan on checking BMP at that time.  We will get baseline test today to make sure that his potassium is stable and not already elevated. - lisinopril (PRINIVIL,ZESTRIL) 5 MG tablet; Take 1 tablet (5 mg total) by mouth daily.  Dispense: 30 tablet; Refill: 3  3. Immunization due  - Flu Vaccine QUAD 6+ mos PF IM (Fluarix Quad PF)-vaccination deferred by patient. - Tdap vaccine greater than or equal to 7yo IM-sedation given - Pneumococcal conjugate vaccine 13-valent  4. Fatigue, unspecified type Most likely multifactorial in etiology.  I have reviewed lab testing which was performed by GI and during his hospitalization.  Patient has had a full battery of test.  At this time would only recommend TSH and vitamin D. - TSH - VITAMIN D 25 Hydroxy (Vit-D Deficiency, Fractures)  5. High risk medication use Continue to monitor spironolactone and lisinopril use due to risk of hyper kalemia.  6. Anxiety Discussed with patient today that I believe that he is experiencing anxiety and mild depression.  He is not interested in starting medication at this time.  He believes that he can institute lifestyle modifications and stress reduction in his life which would improve his symptoms.  He will start some daily walking.  We did discuss his situation regarding home environment.  He would like to speak with a community resource/community paramedic.  I will place this referral to see if they can assist him with any food and transportation needs to his visits.   Suicide risks evaluated and documented in note if present or in the area below.  Patient does not have/denies the  following risks: previous suicide attempts, family history of suicide.   Patient has protective factors of family and community support.  Patient reports that family believes is behaving rationally. Patient displays problem solving skills.   Patient specifically denies suicide ideation. Patient has access/information to healthcare contacts if situation or mood changes where patient is a risk to self or others or mood becomes unstable.   During the encounter, the patient had good eye contact and firm handshake regarding safety contract and agreement to seek help if mood worsens and not to harm self.   Patient understands the treatment plan and is in agreement. Agrees to keep follow up and call prior or return to clinic if needed.   7. Alcohol abuse, history Check B12 secondary to significant history of alcohol abuse. - Vitamin B12  Return in about 4 weeks (around 10/24/2017) for follow up. Caren Macadam, MD 09/26/2017

## 2017-09-26 NOTE — Discharge Instructions (Signed)
As discussed, your evaluation today has been largely reassuring.  But, it is important that you monitor your condition carefully, and do not hesitate to return to the ED if you develop new, or concerning changes in your condition. ? ?Otherwise, please follow-up with your physician for appropriate ongoing care. ? ?

## 2017-09-26 NOTE — ED Triage Notes (Signed)
Pt reports upper abdominal pain since lunch. Pt states pain is sharp. Denies NVD. Feels weak as well

## 2017-09-26 NOTE — Patient Instructions (Signed)

## 2017-09-26 NOTE — ED Provider Notes (Signed)
[facility] EMERGENCY DEPARTMENT Provider Note   CSN: [id number] Arrival date & time: 09/26/17  1513     History   Chief Complaint Chief Complaint  Patient presents with  . Abdominal Pain    HPI Thomas Pratt is a 57 y.o. male.  HPI Vision presents 1 month after laparoscopic cholecystectomy, now with increasing abdominal pain, nausea, weakness. He notes that he was recovering generally well until today, when without clear precipitant he felt sudden sharp pain in the mid abdomen. Since onset the pain is been persistent, nonradiating, focally around the mid abdomen, periumbilical and upper region. There is associated nausea, weakness, no fever, no incontinence, diarrhea, vomiting. No medication taken for pain relief, no clear alleviating or exacerbating factors. Past Medical History:  Diagnosis Date  . Cirrhosis ([place])    likely ETOH use     Patient Active Problem List   Diagnosis Date Noted  . Diabetes mellitus ([place]) 09/26/2017  . Abnormal LFTs 09/16/2017  . Calculus of gallbladder without cholecystitis without obstruction   . Dyspepsia 07/31/2017  . Inguinal hernia 11/14/2016  . Malnutrition of moderate degree 05/22/2016  . Lower extremity edema   . Hypoalbuminemia 05/21/2016  . Hyponatremia 05/21/2016  . Hypokalemia 05/21/2016  . HTN (hypertension) 05/21/2016  . Ascites 05/21/2016  . ALC (alcoholic liver cirrhosis) ([place]) 05/21/2016    Past Surgical History:  Procedure Laterality Date  . CHOLECYSTECTOMY N/A 09/01/2017   Procedure: LAPAROSCOPIC CHOLECYSTECTOMY;  Surgeon: Aviva Signs, MD;  Location: [facility];  Service: General;  Laterality: N/A;  . COLONOSCOPY WITH PROPOFOL N/A 03/06/2017   Dr. Gala Romney: tubular adenoma removed, next TCS 5 years if health permits.   . ESOPHAGOGASTRODUODENOSCOPY (EGD) WITH PROPOFOL N/A 03/06/2017   Dr. Gala Romney: grade 1 esophageal varices, erosive esophagitis, portal gastropathy.   Marland Kitchen HERNIA REPAIR Right    RIH  . INGUINAL HERNIA  REPAIR Right 07/11/2017   Procedure: HERNIA REPAIR INGUINAL ADULT WITH MESH;  Surgeon: Aviva Signs, MD;  Location: [facility];  Service: General;  Laterality: Right;  patient knows to arrive at 7:30  . POLYPECTOMY  03/06/2017   Procedure: POLYPECTOMY;  Surgeon: Daneil Dolin, MD;  Location: [facility];  Service: Endoscopy;;  ascending colon;       Home Medications    Prior to Admission medications   Medication Sig Start Date End Date Taking? Authorizing Provider  furosemide (LASIX) 40 MG tablet Take 1 tablet (40 mg total) by mouth daily. 08/22/17  Yes Mahala Menghini, PA-C  nadolol (CORGARD) 40 MG tablet Take 1 tablet (40 mg total) by mouth daily. And 1/2 tablet in evening Patient taking differently: Take 20-40 mg by mouth See admin instructions. Take 40 mg in the morning And 1/2 tablet (20 mg) in evening 04/25/17  Yes Annitta Needs, NP  pantoprazole (PROTONIX) 40 MG tablet Take 40 mg by mouth daily.   Yes [provider]  spironolactone (ALDACTONE) 100 MG tablet TAKE 1 TABLET BY MOUTH EVERY DAY 08/22/17  Yes Mahala Menghini, PA-C  lisinopril (PRINIVIL,ZESTRIL) 5 MG tablet Take 1 tablet (5 mg total) by mouth daily. 09/26/17   Caren Macadam, MD  metFORMIN (GLUCOPHAGE) 500 MG tablet Take 1 tablet (500 mg total) by mouth 2 (two) times daily with a meal. Patient not taking: Reported on 09/26/2017 08/14/17 09/16/17  Long, Wonda Olds, MD    Family History Family History  Problem Relation Age of Onset  . COPD Mother   . Liver disease Neg Hx   . Colon cancer  Neg Hx   . Colon polyps Neg Hx     Social History Social History   Tobacco Use  . Smoking status: Never Smoker  . Smokeless tobacco: Never Used  Substance Use Topics  . Alcohol use: No    Comment: Quit September 2017, heavy alcohol abuse previously  . Drug use: No     Allergies   Patient has no known allergies.   Review of Systems Review of Systems  Constitutional:       Per HPI, otherwise negative  HENT:       Per  HPI, otherwise negative  Respiratory:       Per HPI, otherwise negative  Cardiovascular:       Per HPI, otherwise negative  Gastrointestinal: Positive for nausea. Negative for vomiting.  Endocrine:       Negative aside from HPI  Genitourinary:       Neg aside from HPI   Musculoskeletal:       Per HPI, otherwise negative  Skin: Negative.   Neurological: Positive for weakness. Negative for syncope.     Physical Exam Updated Vital Signs BP 104/76   Pulse 72   SpO2 100%   Physical Exam  Constitutional: He is oriented to person, place, and time. He appears well-developed. No distress.  HENT:  Head: Normocephalic and atraumatic.  Eyes: Conjunctivae and EOM are normal.  Cardiovascular: Normal rate and regular rhythm.  Pulmonary/Chest: Effort normal. No stridor. No respiratory distress.  Abdominal: He exhibits no distension. There is tenderness in the epigastric area and periumbilical area. There is guarding.  Musculoskeletal: He exhibits no edema.  Neurological: He is alert and oriented to person, place, and time.  Skin: Skin is warm and dry.  Surgical incision sites on the abdomen all unremarkable  Psychiatric: He has a normal mood and affect.  Nursing note and vitals reviewed.    ED Treatments / Results  Labs (all labs ordered are listed, but only abnormal results are displayed) Labs Reviewed  COMPREHENSIVE METABOLIC PANEL - Abnormal; Notable for the following components:      Result Value   Sodium 134 (*)    Chloride 97 (*)    Glucose, Bld 143 (*)    Calcium 10.5 (*)    Total Protein 9.7 (*)    AST 59 (*)    Alkaline Phosphatase 266 (*)    All other components within normal limits  CBC WITH DIFFERENTIAL/PLATELET - Abnormal; Notable for the following components:   WBC 12.0 (*)    Neutro Abs 9.4 (*)    All other components within normal limits  LIPASE, BLOOD  URINALYSIS, ROUTINE W REFLEX MICROSCOPIC    Radiology Ct Abdomen Pelvis W Contrast  Result Date:  09/26/2017 CLINICAL DATA:  57 y/o M; upper abdominal pain. History of cholecystectomy and cirrhosis. EXAM: CT ABDOMEN AND PELVIS WITH CONTRAST TECHNIQUE: Multidetector CT imaging of the abdomen and pelvis was performed using the standard protocol following bolus administration of intravenous contrast. CONTRAST:  12mL ISOVUE-300 IOPAMIDOL (ISOVUE-300) INJECTION 61% COMPARISON:  07/28/2017 CT abdomen and pelvis FINDINGS: Lower chest: Gynecomastia. Hepatobiliary: Cirrhosis. Cholecystectomy. Portosystemic shunt via right gonadal vein. Pancreas: Unremarkable. No pancreatic ductal dilatation or surrounding inflammatory changes. Spleen: Normal in size without focal abnormality. Adrenals/Urinary Tract: Adrenal glands are unremarkable. Right kidney lower pole subcentimeter cyst. Bilateral kidney cortical scars. Kidneys are otherwise normal, without renal calculi, focal lesion, or hydronephrosis. Bladder is unremarkable. Stomach/Bowel: Stomach is within normal limits. Appendix appears normal. No evidence of bowel wall thickening,  distention, or inflammatory changes. Vascular/Lymphatic: Aortic atherosclerosis. No enlarged abdominal or pelvic lymph nodes. Reproductive: Prostate is unremarkable. Other: Diminished postsurgical changes related to right inguinal hernia repair. Stable moderate volume of ascites. Musculoskeletal: No fracture is seen. IMPRESSION: 1. No acute finding.  Interval cholecystectomy. 2. Stable findings of cirrhosis and moderate volume of ascites. 3. Additional stable findings as above. Electronically Signed   By: Kristine Garbe M.D.   On: 09/26/2017 19:59    Procedures Procedures (including critical care time)  Medications Ordered in ED Medications  0.9 %  sodium chloride infusion ( Intravenous New Bag/Given 09/26/17 1745)  fentaNYL (SUBLIMAZE) injection 50 mcg (50 mcg Intravenous Given 09/26/17 1746)  ondansetron (ZOFRAN) injection 4 mg (4 mg Intravenous Given 09/26/17 1745)  iopamidol  (ISOVUE-300) 61 % injection 100 mL (100 mLs Intravenous Contrast Given 09/26/17 1930)     Initial Impression / Assessment and Plan / ED Course  I have reviewed the triage vital signs and the nursing notes.  Pertinent labs & imaging results that were available during my care of the patient were reviewed by me and considered in my medical decision making (see chart for details).   After the initial evaluation I reviewed the patient's chart including operative note from last month with laparoscopic cholecystectomy, seemingly without complication.  8:19 PM On repeat exam the patient is awake and alert, in no distress. I discussed all findings with him, including reassuring CT scan, no evidence for acute new abdominal processes. Patient does have minor lab abnormalities including elevated alkaline phosphatase, AST.  Consistent with patient's history of cirrhosis. Given the otherwise reassuring findings, no evidence for infection, no evidence for kidney stone, patient is appropriate for discharge with close outpatient follow-up.   Final Clinical Impressions(s) / ED Diagnoses  Abdominal pain, diffuse   Carmin Muskrat, MD 09/26/17 2020

## 2017-09-27 LAB — LIPID PANEL
Cholesterol: 178 mg/dL (ref <200)
HDL: 53 mg/dL (ref >40)
LDL Cholesterol (Calc): 108 mg/dL (calc) — ABNORMAL HIGH
NON-HDL CHOLESTEROL (CALC): 125 mg/dL (ref <130)
TRIGLYCERIDES: 79 mg/dL (ref <150)
Total CHOL/HDL Ratio: 3.4 (calc) (ref <5.0)

## 2017-09-27 LAB — BASIC METABOLIC PANEL
BUN: 10 mg/dL (ref 7–25)
CHLORIDE: 92 mmol/L — AB (ref 98–110)
CO2: 26 mmol/L (ref 20–32)
Calcium: 10.4 mg/dL — ABNORMAL HIGH (ref 8.6–10.3)
Creat: 1.04 mg/dL (ref 0.70–1.33)
GLUCOSE: 150 mg/dL — AB (ref 65–139)
Potassium: 4.6 mmol/L (ref 3.5–5.3)
SODIUM: 128 mmol/L — AB (ref 135–146)

## 2017-09-27 LAB — VITAMIN D 25 HYDROXY (VIT D DEFICIENCY, FRACTURES): VIT D 25 HYDROXY: 13 ng/mL — AB (ref 30–100)

## 2017-09-27 LAB — VITAMIN B12: Vitamin B-12: 684 pg/mL (ref 200–1100)

## 2017-09-27 LAB — TSH: TSH: 2.02 m[IU]/L (ref 0.40–4.50)

## 2017-09-30 ENCOUNTER — Telehealth: Payer: Self-pay | Admitting: Family Medicine

## 2017-09-30 NOTE — Telephone Encounter (Signed)
Please call patient and see how he is doing.  I saw that he was seen in the emergency department for some abdominal pain.  He was seen for the abdominal pain the same day that he came in for an visit in our office.  However, he did not mention this pain while he was being seen.  Please encourage him to keep his scheduled appointment with Dr. Cyndi Bender on 313 at 3 PM.

## 2017-10-01 ENCOUNTER — Encounter: Payer: Self-pay | Admitting: Gastroenterology

## 2017-10-01 ENCOUNTER — Ambulatory Visit (INDEPENDENT_AMBULATORY_CARE_PROVIDER_SITE_OTHER): Payer: Self-pay | Admitting: Gastroenterology

## 2017-10-01 ENCOUNTER — Ambulatory Visit: Payer: Self-pay | Admitting: Gastroenterology

## 2017-10-01 ENCOUNTER — Other Ambulatory Visit: Payer: Self-pay | Admitting: *Deleted

## 2017-10-01 VITALS — BP 127/80 | HR 78 | Temp 96.9°F | Ht 68.0 in | Wt 161.0 lb

## 2017-10-01 DIAGNOSIS — K7031 Alcoholic cirrhosis of liver with ascites: Secondary | ICD-10-CM

## 2017-10-01 DIAGNOSIS — R1013 Epigastric pain: Secondary | ICD-10-CM

## 2017-10-01 MED ORDER — LIDOCAINE VISCOUS 2 % MT SOLN: 15.0000 mL | Freq: Four times a day (QID) | OROMUCOSAL | 3 refills | Status: DC | PRN

## 2017-10-01 NOTE — Progress Notes (Signed)
Primary Care Physician:  Caren Macadam, MD Primary GI: Dr. Gala Romney   Chief Complaint  Patient presents with  . Abdominal Pain    HPI:   Thomas Pratt is a 57 y.o. male presenting today with a history of ETOH cirrhosis, alcoholic hepatitis in Oct 2017 but quit drinking September 2017. Underwent cholecystectomy in Feb 2019. Had extensive evaluation for abdominal pain that led to cholecystectomy. Due to history of positive AMA and ASMA, transjugular liver biopsy had been planned but cancelled due to abdominal pain.   Recently seen in ED with abdominal pain. CT showed stable findings of cirrhosis and moderate volume of ascites. Non-specific mildly elevated WBC count in ED. Alk Phos 266, AST mildly elevated at 59.   Has pain that radiates from RLQ up right side of abdomen. Pain upper abdomen, LUQ, up through chest. Started having some shortness of breath Friday, just rare. No constipation. No nausea. No worsening of pain after eating, already feels full. Feels full off of small amounts of food. Doesn't eat like he used to. Notes that he is getting back to swelling. He is feeling like he did when he originally started decompensating. No alcohol since Sept 2017. Protonix once daily. He took some aspirin with tylenol. Some burping.   Past Medical History:  Diagnosis Date  . Cirrhosis ([place])    likely ETOH use     Past Surgical History:  Procedure Laterality Date  . CHOLECYSTECTOMY N/A 09/01/2017   Procedure: LAPAROSCOPIC CHOLECYSTECTOMY;  Surgeon: Aviva Signs, MD;  Location: [facility];  Service: General;  Laterality: N/A;  . COLONOSCOPY WITH PROPOFOL N/A 03/06/2017   Dr. Gala Romney: tubular adenoma removed, next TCS 5 years if health permits.   . ESOPHAGOGASTRODUODENOSCOPY (EGD) WITH PROPOFOL N/A 03/06/2017   Dr. Gala Romney: grade 1 esophageal varices, erosive esophagitis, portal gastropathy.   Marland Kitchen HERNIA REPAIR Right    RIH  . INGUINAL HERNIA REPAIR Right 07/11/2017   Procedure: HERNIA REPAIR  INGUINAL ADULT WITH MESH;  Surgeon: Aviva Signs, MD;  Location: [facility];  Service: General;  Laterality: Right;  patient knows to arrive at 7:30  . POLYPECTOMY  03/06/2017   Procedure: POLYPECTOMY;  Surgeon: Daneil Dolin, MD;  Location: [facility];  Service: Endoscopy;;  ascending colon;    Current Outpatient Medications  Medication Sig Dispense Refill  . furosemide (LASIX) 40 MG tablet Take 1 tablet (40 mg total) by mouth daily. 30 tablet 5  . lisinopril (PRINIVIL,ZESTRIL) 5 MG tablet Take 1 tablet (5 mg total) by mouth daily. 30 tablet 3  . nadolol (CORGARD) 40 MG tablet Take 1 tablet (40 mg total) by mouth daily. And 1/2 tablet in evening (Patient taking differently: Take 20-40 mg by mouth See admin instructions. Take 40 mg in the morning And 1/2 tablet (20 mg) in evening) 60 tablet 3  . pantoprazole (PROTONIX) 40 MG tablet Take 40 mg by mouth daily.    Marland Kitchen spironolactone (ALDACTONE) 100 MG tablet TAKE 1 TABLET BY MOUTH EVERY DAY 30 tablet 5  . metFORMIN (GLUCOPHAGE) 500 MG tablet Take 1 tablet (500 mg total) by mouth 2 (two) times daily with a meal. (Patient not taking: Reported on 09/26/2017) 60 tablet 0   No current facility-administered medications for this visit.     Allergies as of 10/01/2017  . (No Known Allergies)    Family History  Problem Relation Age of Onset  . COPD Mother   . Liver disease Neg Hx   . Colon cancer Neg Hx   .  Colon polyps Neg Hx     Social History   Socioeconomic History  . Marital status: Divorced    Spouse name: None  . Number of children: None  . Years of education: None  . Highest education level: None  Social Needs  . Financial resource strain: None  . Food insecurity - worry: None  . Food insecurity - inability: None  . Transportation needs - medical: None  . Transportation needs - non-medical: None  Occupational History  . Occupation: unemployed  Tobacco Use  . Smoking status: Never Smoker  . Smokeless tobacco: Never Used    Substance and Sexual Activity  . Alcohol use: No    Comment: Quit September 2017, heavy alcohol abuse previously  . Drug use: No  . Sexual activity: Not Currently    Partners: Female  Other Topics Concern  . None  Social History Narrative   Lives in [place], [place]. 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    Drank alcohol for years, quit 05/07/16. Has been sober since then. Quit b/c of health/cirrhosis.   Divorced for over 15 years.   Dating sporadically.   Has 3 daughters and 1 son live locally.   Originally from [place], moved here b/c of wife.    Eats all foods.    Wear seatbelt.   Does not drive, does not have a license, lost due to DWI.    Brought by neighbor.    Does not go to church. Believes in God. Prays each night, per report.     Review of Systems: As mentioned in HPI.   Physical Exam: BP 127/80   Pulse 78   Temp (!) 96.9 F (36.1 C) (Oral)   Ht '5\' 8"'  (1.727 m)   Wt 161 lb (73 kg)   BMI 24.48 kg/m  General:   Alert and oriented. No distress noted. Pleasant and cooperative.  Head:  Normocephalic and atraumatic. Eyes:  Conjuctiva clear without scleral icterus. Mouth:  Oral mucosa pink and moist.  Abdomen:  +BS, soft, non-tense ascites, non-tender and non-distended. No rebound or guarding.  Msk:  Symmetrical without gross deformities. Normal posture. Extremities:  Without edema. Neurologic:  Alert and  oriented x4 Psych:  Alert and cooperative. Normal mood and affect.  Lab Results  Component Value Date   ALT 58 09/26/2017   AST 59 (H) 09/26/2017   ALKPHOS 266 (H) 09/26/2017   BILITOT 1.0 09/26/2017   Lab Results  Component Value Date   WBC 12.0 (H) 09/26/2017   HGB 14.7 09/26/2017   HCT 42.8 09/26/2017   MCV 94.9 09/26/2017   PLT 334 09/26/2017   Lab Results  Component Value Date   CREATININE 0.98 09/26/2017   BUN 15 09/26/2017   NA 134 (L) 09/26/2017   K 4.7 09/26/2017   CL 97 (L) 09/26/2017   CO2 25 09/26/2017

## 2017-10-01 NOTE — Telephone Encounter (Signed)
Called patient regarding message below. No answer, unable to leave message.  

## 2017-10-01 NOTE — Patient Instructions (Addendum)
For abdominal pain: I would like to have you stop Protonix. Start the samples of Dexilant once daily. Let me know if this helps. If it does, we have given you papers to fill out for medication coverage from the company.   I have also sent in a liquid to take by mouth as needed for abdominal pain every 6 hours. This helps to "numb".   I would like for you to continue lasix 40 milligrams each morning with spironolactone 100 milligrams each morning, and add a half tablet of lasix and aldactone in the afternoon. (So, 1 tablet of lasix and spironolactone in the morning and a 1/2 tablet of each in the afternoon). We need to recheck your kidney function in 1 week. I want to be careful that we don't pull off too much fluid.   We are arranging a liver biopsy.  I think your pain could be coming from different things: it could be that you have accumulation of fluid in your belly that causes discomfort, along with esophagitis/gastritis. Don't take any aspirin, ibuprofen, motrin, advil, aleve, or anything like that.   Let me know how you do with the new reflux medication, Dexilant!

## 2017-10-02 ENCOUNTER — Telehealth: Payer: Self-pay | Admitting: Family Medicine

## 2017-10-02 NOTE — Telephone Encounter (Signed)
Cb# [id number]  Patient returned your call, states he did go to his appt with Dr.Boone yesterday.

## 2017-10-06 NOTE — Assessment & Plan Note (Signed)
Improved short-term after cholecystectomy but now returned. EGD up-to-date. Prior extensive evaluation for abdominal pain completed. CT recently on file. No pain with eating. Weight remaining stable and actually increased since last year, although this is likely multifactorial. Will trial Dexilant and attempt patient assistance coverage for this. Trial of lidocaine solution prn. Completely avoid NSAIDs. Monitor for tense ascites, as he would need para with fluid analysis. Does not appear acutely ill at time of visit. As of note, patient is always very pleasant and no distress despite reports of abdominal pain, stating he does not like to complain and is dealing with this. Hopefully, he will see improvement with these supportive measures.

## 2017-10-06 NOTE — Assessment & Plan Note (Signed)
Continues to abstain from alcohol sine 2017. Applauded on abstinence. History of positive AMA and ASMA, with plans for transjugular liver biopsy in future. Non-tense ascites on exam. Continue lasix 40 mg and aldactone 100 mg each morning, but add 20 mg lasix and 50 mg aldactone in afternoon. Recheck BMP in 1 week. Query if accumulation of ascites is contributing to abdominal discomfort. EGD up-to-date. Recent cholecystectomy completed with improvement in abdominal pain but now returned. Clinically not consistent with SBP. If tense ascites, will need tap with fluid analysis.

## 2017-10-07 ENCOUNTER — Other Ambulatory Visit: Payer: Self-pay | Admitting: Radiology

## 2017-10-07 NOTE — Progress Notes (Signed)
cc'ed to pcp °

## 2017-10-09 ENCOUNTER — Ambulatory Visit (HOSPITAL_COMMUNITY): Payer: Self-pay

## 2017-10-09 ENCOUNTER — Other Ambulatory Visit (HOSPITAL_COMMUNITY): Payer: Self-pay

## 2017-10-13 ENCOUNTER — Other Ambulatory Visit: Payer: Self-pay | Admitting: Radiology

## 2017-10-14 ENCOUNTER — Ambulatory Visit (HOSPITAL_COMMUNITY)
Admission: RE | Admit: 2017-10-14 | Discharge: 2017-10-14 | Disposition: A | Discharge status: Home / Self Care | Payer: Medicaid Other | Source: Ambulatory Visit | Attending: Gastroenterology | Admitting: Gastroenterology

## 2017-10-14 ENCOUNTER — Encounter (HOSPITAL_COMMUNITY): Payer: Self-pay

## 2017-10-14 ENCOUNTER — Other Ambulatory Visit: Payer: Self-pay | Admitting: Gastroenterology

## 2017-10-14 ENCOUNTER — Ambulatory Visit (HOSPITAL_COMMUNITY)
Admission: RE | Admit: 2017-10-14 | Discharge: 2017-10-14 | Disposition: A | Discharge status: Home / Self Care | Payer: Medicaid Other | Source: Ambulatory Visit | Attending: Interventional Radiology | Admitting: Interventional Radiology

## 2017-10-14 DIAGNOSIS — K753 Granulomatous hepatitis, not elsewhere classified: Secondary | ICD-10-CM | POA: Diagnosis not present

## 2017-10-14 DIAGNOSIS — K7031 Alcoholic cirrhosis of liver with ascites: Secondary | ICD-10-CM | POA: Diagnosis present

## 2017-10-14 HISTORY — PX: IR TRANSCATHETER BX: IMG713

## 2017-10-14 HISTORY — PX: IR US GUIDE VASC ACCESS RIGHT: IMG2390

## 2017-10-14 HISTORY — PX: IR VENOGRAM HEPATIC WO HEMODYNAMIC EVALUATION: IMG693

## 2017-10-14 LAB — CBC WITH DIFFERENTIAL/PLATELET
Basophils Absolute: 0 10*3/uL (ref 0.0–0.1)
Basophils Relative: 0 %
EOS ABS: 0 10*3/uL (ref 0.0–0.7)
EOS PCT: 1 %
HCT: 38.9 % — ABNORMAL LOW (ref 39.0–52.0)
Hemoglobin: 13.7 g/dL (ref 13.0–17.0)
Lymphocytes Relative: 21 %
Lymphs Abs: 1.5 10*3/uL (ref 0.7–4.0)
MCH: 32.8 pg (ref 26.0–34.0)
MCHC: 35.2 g/dL (ref 30.0–36.0)
MCV: 93.1 fL (ref 78.0–100.0)
Monocytes Absolute: 0.5 10*3/uL (ref 0.1–1.0)
Monocytes Relative: 6 %
Neutro Abs: 5.2 10*3/uL (ref 1.7–7.7)
Neutrophils Relative %: 72 %
PLATELETS: 259 10*3/uL (ref 150–400)
RBC: 4.18 MIL/uL — AB (ref 4.22–5.81)
RDW: 13.5 % (ref 11.5–15.5)
WBC: 7.2 10*3/uL (ref 4.0–10.5)

## 2017-10-14 LAB — COMPREHENSIVE METABOLIC PANEL
ALT: 38 U/L (ref 17–63)
AST: 47 U/L — AB (ref 15–41)
Albumin: 3.7 g/dL (ref 3.5–5.0)
Alkaline Phosphatase: 166 U/L — ABNORMAL HIGH (ref 38–126)
Anion gap: 11 (ref 5–15)
BUN: 12 mg/dL (ref 6–20)
CHLORIDE: 97 mmol/L — AB (ref 101–111)
CO2: 25 mmol/L (ref 22–32)
CREATININE: 0.95 mg/dL (ref 0.61–1.24)
Calcium: 10.1 mg/dL (ref 8.9–10.3)
GFR calc non Af Amer: >60 mL/min (ref >60)
Glucose, Bld: 137 mg/dL — ABNORMAL HIGH (ref 65–99)
Potassium: 4.1 mmol/L (ref 3.5–5.1)
SODIUM: 133 mmol/L — AB (ref 135–145)
Total Bilirubin: 0.7 mg/dL (ref 0.3–1.2)
Total Protein: 9.4 g/dL — ABNORMAL HIGH (ref 6.5–8.1)

## 2017-10-14 LAB — CBC
HEMATOCRIT: 40.6 % (ref 39.0–52.0)
HEMOGLOBIN: 13.6 g/dL (ref 13.0–17.0)
MCH: 31.5 pg (ref 26.0–34.0)
MCHC: 33.5 g/dL (ref 30.0–36.0)
MCV: 94 fL (ref 78.0–100.0)
Platelets: 242 10*3/uL (ref 150–400)
RBC: 4.32 MIL/uL (ref 4.22–5.81)
RDW: 13.5 % (ref 11.5–15.5)
WBC: 7.1 10*3/uL (ref 4.0–10.5)

## 2017-10-14 LAB — PROTIME-INR
INR: 1.08
Prothrombin Time: 14 seconds (ref 11.4–15.2)

## 2017-10-14 MED ORDER — MIDAZOLAM HCL 2 MG/2ML IJ SOLN
INTRAMUSCULAR | Status: AC | PRN
  Administered 2017-10-14 (×5): 0.5 mg via INTRAVENOUS
  Administered 2017-10-14: 1 mg via INTRAVENOUS

## 2017-10-14 MED ORDER — LIDOCAINE HCL (PF) 1 % IJ SOLN
INTRAMUSCULAR | Status: AC | PRN
  Administered 2017-10-14: 2 mL

## 2017-10-14 MED ORDER — FENTANYL CITRATE (PF) 100 MCG/2ML IJ SOLN
INTRAMUSCULAR | Status: AC
  Filled 2017-10-14: qty 2

## 2017-10-14 MED ORDER — MIDAZOLAM HCL 2 MG/2ML IJ SOLN
INTRAMUSCULAR | Status: AC
  Filled 2017-10-14: qty 6

## 2017-10-14 MED ORDER — FENTANYL CITRATE (PF) 100 MCG/2ML IJ SOLN
INTRAMUSCULAR | Status: AC | PRN
  Administered 2017-10-14 (×2): 25 ug via INTRAVENOUS
  Administered 2017-10-14: 50 ug via INTRAVENOUS

## 2017-10-14 MED ORDER — SODIUM CHLORIDE 0.9 % IV SOLN
INTRAVENOUS | Status: DC
  Administered 2017-10-14: 09:00:00 via INTRAVENOUS

## 2017-10-14 MED ORDER — LIDOCAINE HCL 1 % IJ SOLN
INTRAMUSCULAR | Status: AC
  Filled 2017-10-14: qty 20

## 2017-10-14 MED ORDER — IOPAMIDOL (ISOVUE-300) INJECTION 61%
INTRAVENOUS | Status: AC
  Administered 2017-10-14: 25 mL
  Filled 2017-10-14: qty 50

## 2017-10-14 NOTE — Sedation Documentation (Signed)
Patient denies pain and is resting comfortably.  

## 2017-10-14 NOTE — Sedation Documentation (Signed)
Patient is resting comfortably. 

## 2017-10-14 NOTE — Progress Notes (Signed)
PA paged for patient with abdominal pain.   To bedside for assessment.  Patient states he was recovering well this afternoon without pain, able to tolerate his lunch without issue, however was getting dressed and ready for possible discharge home when he developed a sharp LUQ pain.  Patient currently lying on his right side and states that as long as he doesn't move he feels fine.  Abdomen is mildly tender to palpation in the LUQ and left mid abdomen.  No change in pain with light or deep palpation.  No pain on the right abdomen.  Per RN, he has had some hypotension this afternoon of which he was asymptomatic.  With adjustment of cuff and recheck vitals are then WNL.  BP during assessment by PA was 125/64, HR 70.  Patient conversant, polite, not in distress or current pain.   Discussed with Dr. Kathlene Cote who recommends continued rest and observation for now.  No new orders at this time.   RN aware.  Informed to continue to reach out to IR MD if patient develops changes in vital signs or pain does not improve.   Brynda Greathouse, MS RD PA-C 5:02 PM

## 2017-10-14 NOTE — Progress Notes (Signed)
Paged Dr Kathlene Cote about results of ct scan and no answer; called [phone] and spoke to Dr Shelah Lewandowsky and she looked at the ct scan and cbc and per Dr Shelah Lewandowsky client may be discharged

## 2017-10-14 NOTE — Progress Notes (Addendum)
Brynda Greathouse, PA and Dr Kathlene Cote notified of patients pain level report by patient of a level 10 out of 10 multiple times.  New orders obtained by Dr Kathlene Cote for Stat CBC and CT scan.  Reported off to Flavia Shipper, Therapist, sports.

## 2017-10-14 NOTE — Progress Notes (Signed)
Patient ID: Thomas Pratt, male   DOB: 02/20/1961, 57 y.o.   MRN: [id number]  Interventional Radiology:  CBC stable and STAT CT shows no evidence of hemorrhage or other acute findings.  OK to discharge to home.  Patient aware to return to Emergency Department if he has any severe pain tonight.  Venetia Night. Kathlene Cote, M.D Pager:  [phone]

## 2017-10-14 NOTE — Progress Notes (Signed)
Patient had a few questionable low blood pressure reading with no symptoms.  Dr. Kathlene Cote notified and patient encouraged to stay hydrated and nurished. Patient ate 100% of lunch tray and drank 12oz gingerale. Normal blood pressure readings noted.

## 2017-10-14 NOTE — H&P (Signed)
Chief Complaint: Patient was seen in consultation today for transjugular liver biopsy/paracentesis at the request of Annitta Needs  Referring Physician(s): Annitta Needs Dr Jannette Spanner   Supervising Physician: Aletta Edouard  Patient Status: [facility] - Out-pt  History of Present Illness: Thomas Pratt is a 57 y.o. male   ETOH Cirrhosis Alcohol hepatitis Serial paracentesis Last para 6 mo ago  Cholecystectomy 08/2017 +AMA and + ASMA Elevated LFTs  Request TJ liver bx Scheduled for this today  Past Medical History:  Diagnosis Date  . Cirrhosis ([place])    likely ETOH use     Past Surgical History:  Procedure Laterality Date  . CHOLECYSTECTOMY N/A 09/01/2017   Procedure: LAPAROSCOPIC CHOLECYSTECTOMY;  Surgeon: Aviva Signs, MD;  Location: [facility];  Service: General;  Laterality: N/A;  . COLONOSCOPY WITH PROPOFOL N/A 03/06/2017   Dr. Gala Romney: tubular adenoma removed, next TCS 5 years if health permits.   . ESOPHAGOGASTRODUODENOSCOPY (EGD) WITH PROPOFOL N/A 03/06/2017   Dr. Gala Romney: grade 1 esophageal varices, erosive esophagitis, portal gastropathy.   Marland Kitchen HERNIA REPAIR Right    RIH  . INGUINAL HERNIA REPAIR Right 07/11/2017   Procedure: HERNIA REPAIR INGUINAL ADULT WITH MESH;  Surgeon: Aviva Signs, MD;  Location: [facility];  Service: General;  Laterality: Right;  patient knows to arrive at 7:30  . POLYPECTOMY  03/06/2017   Procedure: POLYPECTOMY;  Surgeon: Daneil Dolin, MD;  Location: [facility];  Service: Endoscopy;;  ascending colon;    Allergies: Patient has no known allergies.  Medications: Prior to Admission medications   Medication Sig Start Date End Date Taking? Authorizing Provider  furosemide (LASIX) 40 MG tablet Take 1 tablet (40 mg total) by mouth daily. 08/22/17  Yes Mahala Menghini, PA-C  lisinopril (PRINIVIL,ZESTRIL) 5 MG tablet Take 1 tablet (5 mg total) by mouth daily. 09/26/17  Yes Hagler, Apolonio Schneiders, MD  nadolol (CORGARD) 40 MG tablet Take 1 tablet (40 mg  total) by mouth daily. And 1/2 tablet in evening Patient taking differently: Take 20-40 mg by mouth See admin instructions. Take 40 mg in the morning And 1/2 tablet (20 mg) in evening 04/25/17  Yes Annitta Needs, NP  pantoprazole (PROTONIX) 40 MG tablet Take 40 mg by mouth daily.   Yes [provider]  spironolactone (ALDACTONE) 100 MG tablet TAKE 1 TABLET BY MOUTH EVERY DAY 08/22/17  Yes Mahala Menghini, PA-C  lidocaine (XYLOCAINE) 2 % solution Use as directed 15 mLs in the mouth or throat every 6 (six) hours as needed (abdominal pain). 10/01/17   Annitta Needs, NP     Family History  Problem Relation Age of Onset  . COPD Mother   . Liver disease Neg Hx   . Colon cancer Neg Hx   . Colon polyps Neg Hx     Social History   Socioeconomic History  . Marital status: Divorced    Spouse name: Not on file  . Number of children: Not on file  . Years of education: Not on file  . Highest education level: Not on file  Occupational History  . Occupation: unemployed  Social Needs  . Financial resource strain: Not on file  . Food insecurity:    Worry: Not on file    Inability: Not on file  . Transportation needs:    Medical: Not on file    Non-medical: Not on file  Tobacco Use  . Smoking status: Never Smoker  . Smokeless tobacco: Never Used  Substance and Sexual Activity  .  Alcohol use: No    Comment: Quit September 2017, heavy alcohol abuse previously  . Drug use: No  . Sexual activity: Not Currently    Partners: Female  Lifestyle  . Physical activity:    Days per week: Not on file    Minutes per session: Not on file  . Stress: Not on file  Relationships  . Social connections:    Talks on phone: Not on file    Gets together: Not on file    Attends religious service: Not on file    Active member of club or organization: Not on file    Attends meetings of clubs or organizations: Not on file    Relationship status: Not on file  Other Topics Concern  . 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    Drank alcohol for years, quit 05/07/16. Has been sober since then. Quit b/c of health/cirrhosis.   Divorced for over 15 years.   Dating sporadically.   Has 3 daughters and 1 son live locally.   Originally from [place], moved here b/c of wife.    Eats all foods.    Wear seatbelt.   Does not drive, does not have a license, lost due to DWI.    Brought by neighbor.    Does not go to church. Believes in God. Prays each night, per report.     Review of Systems: A 12 point ROS discussed and pertinent positives are indicated in the HPI above.  All other systems are negative.  Review of Systems  Constitutional: Negative for activity change, fatigue and fever.  Respiratory: Negative for cough and shortness of breath.   Gastrointestinal: Negative for abdominal distention and abdominal pain.  Neurological: Negative for weakness.  Psychiatric/Behavioral: Negative for behavioral problems and confusion.    Vital Signs: BP 115/70 (BP Location: Right Arm)   Pulse 75   Temp 98.2 F (36.8 C) (Oral)   Ht 5\' 8"  (1.727 m)   Wt 161 lb (73 kg)   SpO2 99%   BMI 24.48 kg/m   Physical Exam  Constitutional: He is oriented to person, place, and time.  Cardiovascular: Normal rate, regular rhythm and normal heart sounds.  Pulmonary/Chest: Effort normal and breath sounds normal.  Abdominal: Soft. Bowel sounds are normal. He exhibits no distension. There is no tenderness.  Musculoskeletal: Normal range of motion.  Neurological: He is alert and oriented to person, place, and time.  Skin: Skin is warm and dry.  Psychiatric: He has a normal mood and affect. His behavior is normal. Judgment and thought content normal.  Nursing note and vitals reviewed.   Imaging: Ct Abdomen Pelvis W Contrast  Result Date: 09/26/2017 CLINICAL DATA:  57 y/o M; upper abdominal pain. History of cholecystectomy and cirrhosis.  EXAM: CT ABDOMEN AND PELVIS WITH CONTRAST TECHNIQUE: Multidetector CT imaging of the abdomen and pelvis was performed using the standard protocol following bolus administration of intravenous contrast. CONTRAST:  119mL ISOVUE-300 IOPAMIDOL (ISOVUE-300) INJECTION 61% COMPARISON:  07/28/2017 CT abdomen and pelvis FINDINGS: Lower chest: Gynecomastia. Hepatobiliary: Cirrhosis. Cholecystectomy. Portosystemic shunt via right gonadal vein. Pancreas: Unremarkable. No pancreatic ductal dilatation or surrounding inflammatory changes. Spleen: Normal in size without focal abnormality. Adrenals/Urinary Tract: Adrenal glands are unremarkable. Right kidney lower pole subcentimeter cyst. Bilateral kidney cortical scars. Kidneys are otherwise normal, without renal calculi, focal lesion, or hydronephrosis. Bladder is unremarkable. Stomach/Bowel: Stomach is within normal limits. Appendix  appears normal. No evidence of bowel wall thickening, distention, or inflammatory changes. Vascular/Lymphatic: Aortic atherosclerosis. No enlarged abdominal or pelvic lymph nodes. Reproductive: Prostate is unremarkable. Other: Diminished postsurgical changes related to right inguinal hernia repair. Stable moderate volume of ascites. Musculoskeletal: No fracture is seen. IMPRESSION: 1. No acute finding.  Interval cholecystectomy. 2. Stable findings of cirrhosis and moderate volume of ascites. 3. Additional stable findings as above. Electronically Signed   By: Kristine Garbe M.D.   On: 09/26/2017 19:59    Labs:  CBC: Recent Labs    08/14/17 1205 08/14/17 2000 08/28/17 1114 09/26/17 1717  WBC 7.8 9.6 8.6 12.0*  HGB 14.8 14.3 14.2 14.7  HCT 44.2 42.6 41.7 42.8  PLT 216 240 260 334    COAGS: Recent Labs    02/07/17 1352 04/25/17 1046 07/18/17 1111 08/28/17 1114  INR 1.10 1.09 1.10 1.00  APTT  --   --  36  --     BMP: Recent Labs    08/14/17 1205 08/14/17 2000 08/28/17 1114 09/26/17 1101 09/26/17 1717  NA 130*  135 131* 128* 134*  K 4.1 4.4 4.1 4.6 4.7  CL 93* 97* 95* 92* 97*  CO2 23 26 25 26 25   GLUCOSE 326* 128* 117* 150* 143*  BUN 11 11 13 10 15   CALCIUM 9.6 10.0 10.3 10.4* 10.5*  CREATININE 0.93 0.88 0.86 1.04 0.98  GFRNONAA >60 >60 >60  --  >60  GFRAA >60 >60 >60  --  >60    LIVER FUNCTION TESTS: Recent Labs    08/14/17 1205 08/14/17 2000 08/28/17 1114 09/26/17 1717  BILITOT 1.0 0.9 1.0 1.0  AST 54* 49* 60* 59*  ALT 49 49 62 58  ALKPHOS 164* 181* 167* 266*  PROT 9.3* 9.4* 9.7* 9.7*  ALBUMIN 3.9 4.0 4.3 4.0    TUMOR MARKERS: Recent Labs    11/22/16 1231  AFPTM 2.2    Assessment and Plan:  ETOH hepatitis/cirrhosis + AMA;ASMA Elevated LFTs Scheduled for transjugular liver biopsy (ascites) Risks and benefits discussed with the patient including, but not limited to bleeding, infection, damage to adjacent structures or low yield requiring additional tests.  All of the patient's questions were answered, patient is agreeable to proceed. Consent signed and in chart.   Thank you for this interesting consult.  I greatly enjoyed meeting Thomas Pratt and look forward to participating in their care.  A copy of this report was sent to the requesting provider on this date.  Electronically Signed: Lavonia Drafts, PA-C 10/14/2017, 9:48 AM   I spent a total of  30 Minutes   in face to face in clinical consultation, greater than 50% of which was counseling/coordinating care for TJ liver bx

## 2017-10-14 NOTE — Procedures (Signed)
Interventional Radiology Procedure Note  Procedure: Transjugular liver biopsy with hepatic venogram  Complications: None  Estimated Blood Loss: < 10 mL  Findings: 19 G transjugular core biopsy x 4 via right hepatic vein, yielding core fragments.  Venogram shows patent vein right hepatic and middle hepatic veins.  Venetia Night. Kathlene Cote, M.D Pager:  [phone]

## 2017-10-14 NOTE — Discharge Instructions (Signed)
Liver Biopsy, Care After  Refer to this sheet in the next few weeks. These instructions provide you with information on caring for yourself after your procedure. Your health care provider may also give you more specific instructions. Your treatment has been planned according to current medical practices, but problems sometimes occur. Call your health care provider if you have any problems or questions after your procedure.  What can I expect after the procedure?  After your procedure, it is typical to have the following:  · A small amount of discomfort in the area where the biopsy was done and in the right shoulder or shoulder blade.  · A small amount of bruising around the area where the biopsy was done and on the skin over the liver.  · Sleepiness and fatigue for the rest of the day.    Follow these instructions at home:  · Rest at home for 1-2 days or as directed by your health care provider.  · Have a friend or family member stay with you for at least 24 hours.  · Because of the medicines used during the procedure, you should not do the following things in the first 24 hours:  ? Drive.  ? Use machinery.  ? Be responsible for the care of other people.  ? Sign legal documents.  ? Take a bath or shower.  · There are many different ways to close and cover an incision, including stitches, skin glue, and adhesive strips. Follow your health care provider's instructions on:  ? Incision care.  ? Bandage (dressing) changes and removal.  ? Incision closure removal.  · Do not drink alcohol in the first week.  · Do not lift more than 5 pounds or play contact sports for 2 weeks after this test.  · Take medicines only as directed by your health care provider. Do not take medicine containing aspirin or non-steroidal anti-inflammatory medicines such as ibuprofen for 1 week after this test.  · It is your responsibility to get your test results.  Contact a health care provider if:  · You have increased bleeding from an incision  that results in more than a small spot of blood.  · You have redness, swelling, or increasing pain in any incisions.  · You notice a discharge or a bad smell coming from any of your incisions.  · You have a fever or chills.  Get help right away if:  · You develop swelling, bloating, or pain in your abdomen.  · You become dizzy or faint.  · You develop a rash.  · You are nauseous or vomit.  · You have difficulty breathing, feel short of breath, or feel faint.  · You develop chest pain.  · You have problems with your speech or vision.  · You have trouble balancing or moving your arms or legs.  This information is not intended to replace advice given to you by your health care provider. Make sure you discuss any questions you have with your health care provider.  Document Released: 01/25/2005 Document Revised: 12/14/2015 Document Reviewed: 09/03/2013  Elsevier Interactive Patient Education © 2018 [employer]

## 2017-10-27 NOTE — Progress Notes (Signed)
Please let patient know that I reviewed his pathology from liver biopsy. I will need to review with Dr. Gala Romney. However, we may need to start on a medication called Urso. How is he doing?

## 2017-10-30 ENCOUNTER — Other Ambulatory Visit: Payer: Self-pay

## 2017-10-30 ENCOUNTER — Telehealth: Payer: Self-pay | Admitting: Internal Medicine

## 2017-10-30 ENCOUNTER — Ambulatory Visit (INDEPENDENT_AMBULATORY_CARE_PROVIDER_SITE_OTHER): Payer: Medicaid Other | Admitting: Family Medicine

## 2017-10-30 ENCOUNTER — Encounter: Payer: Self-pay | Admitting: Family Medicine

## 2017-10-30 VITALS — BP 130/64 | HR 72 | Temp 98.1°F | Resp 18 | Ht 68.0 in | Wt 156.8 lb

## 2017-10-30 DIAGNOSIS — Z114 Encounter for screening for human immunodeficiency virus [HIV]: Secondary | ICD-10-CM

## 2017-10-30 DIAGNOSIS — Z79899 Other long term (current) drug therapy: Secondary | ICD-10-CM | POA: Diagnosis not present

## 2017-10-30 DIAGNOSIS — E785 Hyperlipidemia, unspecified: Secondary | ICD-10-CM | POA: Insufficient documentation

## 2017-10-30 DIAGNOSIS — R188 Other ascites: Secondary | ICD-10-CM

## 2017-10-30 DIAGNOSIS — E559 Vitamin D deficiency, unspecified: Secondary | ICD-10-CM

## 2017-10-30 DIAGNOSIS — K7460 Unspecified cirrhosis of liver: Secondary | ICD-10-CM

## 2017-10-30 DIAGNOSIS — Z789 Other specified health status: Secondary | ICD-10-CM

## 2017-10-30 DIAGNOSIS — E1169 Type 2 diabetes mellitus with other specified complication: Secondary | ICD-10-CM | POA: Diagnosis not present

## 2017-10-30 MED ORDER — VITAMIN D (ERGOCALCIFEROL) 1.25 MG (50000 UNIT) PO CAPS: 50000.0000 [IU] | ORAL_CAPSULE | ORAL | 0 refills | Status: DC

## 2017-10-30 MED ORDER — SPIRONOLACTONE 100 MG PO TABS: ORAL_TABLET | ORAL | 1 refills | Status: DC

## 2017-10-30 NOTE — Telephone Encounter (Signed)
Returned call, pt wasn't able to talk due to being at another appointment. Pt will call back.

## 2017-10-30 NOTE — Telephone Encounter (Signed)
Please call patient, he is wondering about his results from his liver biopsy

## 2017-10-30 NOTE — Progress Notes (Signed)
Patient ID: Thomas Pratt, male    DOB: 1961-05-03, 57 y.o.   MRN: [id number]  Chief Complaint  Patient presents with  . Follow-up    Allergies Patient has no known allergies.  Subjective:   Thomas Pratt is a 57 y.o. male who presents to [facility] today.  HPI Thomas Pratt presents for follow-up visit today.  After his last visit, which was his initial visit, he reports that he went out to eat and not too long after eating he developed abdominal pain.  That very same day he went to the emergency department and was treated and evaluated.  He is subsequently been seen by gastroenterology and had a liver biopsy performed.  His biopsy results were given to him but he reports that he really does not understand them.  He reports that he was told that they were going to speak to another doctor and then give him a call back.  He reports that he has an appointment at the end of May but he has not heard anything yet regarding the recommendations for his medications.  He reports that he was seen by Dr. Cyndi Bender and his spironolactone and nadolol were increased.  He reports that he still has pain in his right upper quadrant.  He believes this pain is associated with his liver.  He has not been drinking alcohol and is still continued with his abstinence.  He has lost 4 pounds since he was last year.  He correlates this to trying to eat healthier.  He reports that his appetite is fair.  He denies any changes in his bowel movements.  No melena or bright red blood per rectum.  No hematochezia or hemoptysis. He reports he does not feel down, sad, or hopeless.  He reports that his mood is good.  He does not feel that he needs any medication for his mood.  He reports that his energy level is down at times but correlates it to his cirrhosis. He is here to review the labs which were drawn at his last visit.   Past Medical History:  Diagnosis Date  . Cirrhosis ([place])    likely ETOH use     Past  Surgical History:  Procedure Laterality Date  . CHOLECYSTECTOMY N/A 09/01/2017   Procedure: LAPAROSCOPIC CHOLECYSTECTOMY;  Surgeon: Aviva Signs, MD;  Location: [facility];  Service: General;  Laterality: N/A;  . COLONOSCOPY WITH PROPOFOL N/A 03/06/2017   Dr. Gala Romney: tubular adenoma removed, next TCS 5 years if health permits.   . ESOPHAGOGASTRODUODENOSCOPY (EGD) WITH PROPOFOL N/A 03/06/2017   Dr. Gala Romney: grade 1 esophageal varices, erosive esophagitis, portal gastropathy.   Marland Kitchen HERNIA REPAIR Right    RIH  . INGUINAL HERNIA REPAIR Right 07/11/2017   Procedure: HERNIA REPAIR INGUINAL ADULT WITH MESH;  Surgeon: Aviva Signs, MD;  Location: [facility] ORS;  Service: General;  Laterality: Right;  patient knows to arrive at 7:30  . IR TRANSCATHETER BX  10/14/2017  . IR US GUIDE VASC ACCESS RIGHT  10/14/2017  . IR VENOGRAM HEPATIC WO HEMODYNAMIC EVALUATION  10/14/2017  . POLYPECTOMY  03/06/2017   Procedure: POLYPECTOMY;  Surgeon: Daneil Dolin, MD;  Location: [facility] SU[facility];  Service: Endoscopy;;  ascending colon;    Family History  Problem Relation Age of Onset  . COPD Mother   . Liver disease Neg Hx   . Colon cancer Neg Hx   . Colon polyps Neg Hx      Social History   Socioeconomic History  .  Marital status: Divorced    Spouse name: Not on file  . Number of children: Not on file  . Years of education: Not on file  . Highest education level: Not on file  Occupational History  . Occupation: unemployed  Social Needs  . Financial resource strain: Not on file  . Food insecurity:    Worry: Not on file    Inability: Not on file  . Transportation needs:    Medical: Not on file    Non-medical: Not on file  Tobacco Use  . Smoking status: Never Smoker  . Smokeless tobacco: Never Used  Substance and Sexual Activity  . Alcohol use: No    Comment: Quit September 2017, heavy alcohol abuse previously  . Drug use: No  . Sexual activity: Not Currently    Partners: Female  Lifestyle  . Physical activity:     Days per week: Not on file    Minutes per session: Not on file  . Stress: Not on file  Relationships  . Social connections:    Talks on phone: Not on file    Gets together: Not on file    Attends religious service: Not on file    Active member of club or organization: Not on file    Attends meetings of clubs or organizations: Not on file    Relationship status: Not on file  Other Topics Concern  . Not on file  Social History Narrative   Lives in [place], [place]. 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    Drank alcohol for years, quit 05/07/16. Has been sober since then. Quit b/c of health/cirrhosis.   Divorced for over 15 years.   Dating sporadically.   Has 3 daughters and 1 son live locally.   Originally from [place], moved here b/c of wife.    Eats all foods.    Wear seatbelt.   Does not drive, does not have a license, lost due to DWI.    Brought by neighbor.    Does not go to church. Believes in God. Prays each night, per report.    Current Outpatient Medications on File Prior to Visit  Medication Sig Dispense Refill  . furosemide (LASIX) 40 MG tablet Take 1 tablet (40 mg total) by mouth daily. 30 tablet 5  . lidocaine (XYLOCAINE) 2 % solution Use as directed 15 mLs in the mouth or throat every 6 (six) hours as needed (abdominal pain). 100 mL 3  . lisinopril (PRINIVIL,ZESTRIL) 5 MG tablet Take 1 tablet (5 mg total) by mouth daily. 30 tablet 3  . nadolol (CORGARD) 40 MG tablet Take 1 tablet (40 mg total) by mouth daily. And 1/2 tablet in evening (Patient taking differently: Take 20-40 mg by mouth See admin instructions. Take 40 mg in the morning And 1/2 tablet (20 mg) in evening) 60 tablet 3  . pantoprazole (PROTONIX) 40 MG tablet Take 40 mg by mouth daily.     No current facility-administered medications on file prior to visit.     Review of Systems   Objective:   BP 130/64 (BP Location: Left Arm, Patient Position: Sitting, Cuff Size: Normal)   Pulse 72    Temp 98.1 F (36.7 C) (Temporal)   Resp 18   Ht '5\' 8"'  (1.727 m)   Wt 156 lb 12 oz (71.1 kg)   SpO2 98%   BMI 23.83 kg/m   Physical Exam  Constitutional: He is oriented to person, place, and time. He appears well-developed and  well-nourished.  HENT:  Head: Normocephalic and atraumatic.  Eyes: Pupils are equal, round, and reactive to light. EOM are normal. No scleral icterus.  Neck: Normal range of motion. Neck supple. No JVD present. No tracheal deviation present. No thyromegaly present.  Cardiovascular: Normal rate, regular rhythm, normal heart sounds and intact distal pulses.  Pulses:      Dorsalis pedis pulses are 2+ on the right side, and 2+ on the left side.  Pulmonary/Chest: Effort normal and breath sounds normal. He has no wheezes.  Abdominal: Soft. Bowel sounds are normal. There is tenderness.  Right upper quadrant tenderness to palpation.  No rebound or guarding present.  Musculoskeletal: He exhibits no edema.  Feet:  Right Foot:  Protective Sensation: 6 sites tested. 6 sites sensed.  Skin Integrity: Negative for ulcer, blister, skin breakdown or erythema.  Left Foot:  Protective Sensation: 6 sites tested. 6 sites sensed.  Skin Integrity: Negative for ulcer, blister, skin breakdown or erythema.  Lymphadenopathy:    He has no cervical adenopathy.  Neurological: He is alert and oriented to person, place, and time. No cranial nerve deficit.  Skin: Skin is warm, dry and intact. Capillary refill takes less than 2 seconds.  Psychiatric: He has a normal mood and affect. His behavior is normal. Judgment and thought content normal.  Vitals reviewed.    Assessment and Plan  1. Type 2 diabetes mellitus with other specified complication, without long-term current use of insulin (HCC) Discussed hemoglobin A1c with patient today.  At this time he is diabetic but he is controlled by his diet.  No indication for oral medications at this time.  Patient was counseled regarding dietary  choices to help control his blood sugars. - Ambulatory referral to Ophthalmology -Diabetic foot exam performed today and documented. -Currently on ACE inhibitor. 2. Cirrhosis of liver with ascites, unspecified hepatic cirrhosis type ([place]) -Refill of Spironolactone needed. -Will hold aspirin secondary to history of cirrhosis. -Patient still with persistent right upper quadrant pain although has not worsened in severity.  Called gastroenterology today and information regarding patient discussed.  They will contact patient to get him in for an appointment sooner than the end of May.  I did review his liver biopsy report today. - spironolactone (ALDACTONE) 100 MG tablet; Take one in the morning and 1/2 in the afternoon  Dispense: 45 tablet; Refill: 1  3. High risk medication use Check potassium level due to high risk medications including spironolactone, lisinopril, and Lasix. - Basic metabolic panel  4. Vitamin D deficiency Initiate therapy as directed.  Plan to recheck in 6 months. - Vitamin D, Ergocalciferol, (DRISDOL) 50000 units CAPS capsule; Take 1 capsule (50,000 Units total) by mouth every 7 (seven) days.  Dispense: 12 capsule; Refill: 0  5. Screening for HIV (human immunodeficiency virus) - HIV antibody  6. Statin intolerance Due to the fact the patient has active liver dysfunction and cirrhosis with ascites, I do not believe he is a candidate for statin therapy at this time.  His liver test and alk phosphatase remain elevated several times over the normal limit.  Dietary modifications for lowering cholesterol are discussed with patient.  We will continue to evaluate this at future visits.  Patient was encouraged to continue with alcohol abstinence.  He was told to call with any questions or concerns.  He was told if he does not hear back from GI regarding a sooner appointment to please call our office.  In addition we discussed that some of his pain  is most likely secondary to ascites.   If he notices an increase in his swelling or abdominal circumference he was told to please contact our office or follow-up with his GI.  He voiced understanding. Return in about 3 months (around 01/29/2018). Caren Macadam, MD 10/30/2017

## 2017-10-31 LAB — BASIC METABOLIC PANEL
BUN: 20 mg/dL (ref 7–25)
CALCIUM: 10.1 mg/dL (ref 8.6–10.3)
CO2: 27 mmol/L (ref 20–32)
CREATININE: 1 mg/dL (ref 0.70–1.33)
Chloride: 98 mmol/L (ref 98–110)
GLUCOSE: 87 mg/dL (ref 65–99)
POTASSIUM: 4.8 mmol/L (ref 3.5–5.3)
Sodium: 132 mmol/L — ABNORMAL LOW (ref 135–146)

## 2017-10-31 LAB — HIV ANTIBODY (ROUTINE TESTING W REFLEX): HIV: NONREACTIVE

## 2017-11-04 ENCOUNTER — Encounter: Payer: Self-pay | Admitting: Family Medicine

## 2017-11-05 ENCOUNTER — Encounter: Payer: Self-pay | Admitting: Nurse Practitioner

## 2017-11-05 ENCOUNTER — Ambulatory Visit (INDEPENDENT_AMBULATORY_CARE_PROVIDER_SITE_OTHER): Payer: Medicaid Other | Admitting: Nurse Practitioner

## 2017-11-05 VITALS — BP 106/66 | HR 70 | Temp 97.2°F | Ht 68.0 in | Wt 158.4 lb

## 2017-11-05 DIAGNOSIS — R109 Unspecified abdominal pain: Secondary | ICD-10-CM | POA: Insufficient documentation

## 2017-11-05 DIAGNOSIS — K743 Primary biliary cirrhosis: Secondary | ICD-10-CM | POA: Diagnosis not present

## 2017-11-05 DIAGNOSIS — K7031 Alcoholic cirrhosis of liver with ascites: Secondary | ICD-10-CM | POA: Diagnosis not present

## 2017-11-05 DIAGNOSIS — R1084 Generalized abdominal pain: Secondary | ICD-10-CM | POA: Diagnosis not present

## 2017-11-05 NOTE — Progress Notes (Signed)
Referring Provider: Caren Macadam, MD Primary Care Physician:  Caren Macadam, MD Primary GI:  Dr. Gala Romney  Chief Complaint  Patient presents with  . abdominal swelling    has gained 4 lbs in 1 week    HPI:   Thomas Pratt is a 57 y.o. male who presents follow-up on abdominal swelling.  The patient was last seen in our office 10/01/2017 for dyspepsia and alcoholic cirrhosis of the liver with ascites.  He has not had any alcohol in over 2 years.  Underwent cholecystectomy in February 2019.  Extensive evaluation for abdominal pain has been performed.  History of positive AMA and ASMA.  Recent CT imaging in the emergency room showed findings of cirrhosis and moderate volume of ascites, mildly elevated white blood cell count, alk phos 266, AST 59.  Pain is upper abdomen, left upper quadrant and right lower quadrant.  Feels full also small amounts of food.  Noting some swelling at his last visit.  Protonix daily.  Recommended stop Protonix, start Dexilant, liquid lidocaine.  Continue Lasix 40 mg and Spironolactone 100 mg daily, half doses of these in the afternoon.  Continue alcohol abstinence.  Recheck BMP.  EGD up-to-date.  Recent BNP dated 10/30/2017 with normal creatinine to 1.0.  This recent liver labs dated 10/14/2017 which found CBC with normal platelets at 259, CMP with elevated AST at 47 and elevated alkaline phosphatase at 166.  INR normal at 1.08.  Liver biopsy was completed on 10/14/2017 based on elevated AMA and ASMA.  Findings include granulomatous hepatitis with differentials mainly including PBC.  Other differentials include sarcoidosis, drug-induced liver disease, idiopathic granulomatosis.  Given positive ANA and elevated alk phos this is near diagnostic for PVC.  Recommended Urso 13-15 mg/kg daily on 2 divided doses, referral to hepatology.  Meld score not high enough for transplant evaluation at this time.  Today states he's doing ok overall. Still with his "normal" generalized  abdominal pain, constant, intermittently worse. Feels swollen in his bilateral LE. Early satiety. Decreased appetite. Eats low salt diet. Denies yellowing of skin/eyes, darkened urine, acute episodic confusion, tremors/shakes. Does note some forgetfulness and "tends to drop things" he's holding. Notes easy bruising. Denies hematochezia, melena, N/V. Denies chest pain, dyspnea, dizziness, lightheadedness, syncope, near syncope. Denies any other upper or lower GI symptoms.  Dexilant helped, filled out patient assistance paperwork. Has not heard back yet.  Past Medical History:  Diagnosis Date  . Cirrhosis ([place])    likely ETOH use     Past Surgical History:  Procedure Laterality Date  . CHOLECYSTECTOMY N/A 09/01/2017   Procedure: LAPAROSCOPIC CHOLECYSTECTOMY;  Surgeon: Aviva Signs, MD;  Location: [facility];  Service: General;  Laterality: N/A;  . COLONOSCOPY WITH PROPOFOL N/A 03/06/2017   Dr. Gala Romney: tubular adenoma removed, next TCS 5 years if health permits.   . ESOPHAGOGASTRODUODENOSCOPY (EGD) WITH PROPOFOL N/A 03/06/2017   Dr. Gala Romney: grade 1 esophageal varices, erosive esophagitis, portal gastropathy.   Marland Kitchen HERNIA REPAIR Right    RIH  . INGUINAL HERNIA REPAIR Right 07/11/2017   Procedure: HERNIA REPAIR INGUINAL ADULT WITH MESH;  Surgeon: Aviva Signs, MD;  Location: [facility];  Service: General;  Laterality: Right;  patient knows to arrive at 7:30  . IR TRANSCATHETER BX  10/14/2017  . IR US GUIDE VASC ACCESS RIGHT  10/14/2017  . IR VENOGRAM HEPATIC WO HEMODYNAMIC EVALUATION  10/14/2017  . POLYPECTOMY  03/06/2017   Procedure: POLYPECTOMY;  Surgeon: Daneil Dolin, MD;  Location: [facility];  Service: Endoscopy;;  ascending colon;    Current Outpatient Medications  Medication Sig Dispense Refill  . furosemide (LASIX) 40 MG tablet Take 1 tablet (40 mg total) by mouth daily. 30 tablet 5  . lidocaine (XYLOCAINE) 2 % solution Use as directed 15 mLs in the mouth or throat every 6 (six) hours as  needed (abdominal pain). 100 mL 3  . lisinopril (PRINIVIL,ZESTRIL) 5 MG tablet Take 1 tablet (5 mg total) by mouth daily. 30 tablet 3  . nadolol (CORGARD) 40 MG tablet Take 1 tablet (40 mg total) by mouth daily. And 1/2 tablet in evening (Patient taking differently: Take 20-40 mg by mouth See admin instructions. Take 40 mg in the morning And 1/2 tablet (20 mg) in evening) 60 tablet 3  . pantoprazole (PROTONIX) 40 MG tablet Take 40 mg by mouth daily.    Marland Kitchen spironolactone (ALDACTONE) 100 MG tablet Take one in the morning and 1/2 in the afternoon 45 tablet 1  . Vitamin D, Ergocalciferol, (DRISDOL) 50000 units CAPS capsule Take 1 capsule (50,000 Units total) by mouth every 7 (seven) days. 12 capsule 0   No current facility-administered medications for this visit.     Allergies as of 11/05/2017  . (No Known Allergies)    Family History  Problem Relation Age of Onset  . COPD Mother   . Liver disease Neg Hx   . Colon cancer Neg Hx   . Colon polyps Neg Hx     Social History   Socioeconomic History  . Marital status: Divorced    Spouse name: Not on file  . Number of children: Not on file  . Years of education: Not on file  . Highest education level: Not on file  Occupational History  . Occupation: unemployed  Social Needs  . Financial resource strain: Not on file  . Food insecurity:    Worry: Not on file    Inability: Not on file  . Transportation needs:    Medical: Not on file    Non-medical: Not on file  Tobacco Use  . Smoking status: Never Smoker  . Smokeless tobacco: Never Used  Substance and Sexual Activity  . Alcohol use: No    Comment: Quit September 2017, heavy alcohol abuse previously  . Drug use: No  . Sexual activity: Not Currently    Partners: Female  Lifestyle  . Physical activity:    Days per week: Not on file    Minutes per session: Not on file  . Stress: Not on file  Relationships  . Social connections:    Talks on phone: Not on file    Gets together:  Not on file    Attends religious service: Not on file    Active member of club or organization: Not on file    Attends meetings of clubs or organizations: Not on file    Relationship status: Not on file  Other Topics Concern  . 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    Drank alcohol for years, quit 05/07/16. Has been sober since then. Quit b/c of health/cirrhosis.   Divorced for over 15 years.   Dating sporadically.   Has 3 daughters and 1 son live locally.   Originally from [place], moved here b/c of wife.    Eats all foods.    Wear seatbelt.   Does not drive, does not have a license, lost due to DWI.  Brought by neighbor.    Does not go to church. Believes in God. Prays each night, per report.     Review of Systems: General: Negative for anorexia, weight loss, fever, chills, fatigue, weakness. ENT: Negative for hoarseness, difficulty swallowing , nasal congestion. CV: Negative for chest pain, angina, palpitations, peripheral edema.  Respiratory: Negative for dyspnea at rest, cough, sputum, wheezing.  GI: See history of present illness. MS: Negative for joint pain, low back pain.  Derm: Negative for rash or itching.  Neuro: Negative for memory loss, confusion.  Endo: Negative for unusual weight change.  Heme: Negative for bruising or bleeding. Allergy: Negative for rash or hives.   Physical Exam: BP 106/66   Pulse 70   Temp (!) 97.2 F (36.2 C) (Oral)   Ht 5' 8" (1.727 m)   Wt 158 lb 6.4 oz (71.8 kg)   BMI 24.08 kg/m  General:   Alert and oriented. Pleasant and cooperative. Well-nourished and well-developed.  Eyes:  Without icterus, sclera clear and conjunctiva pink.  Ears:  Normal auditory acuity. Cardiovascular:  S1, S2 present without murmurs appreciated. Extremities without clubbing or edema. Respiratory:  Clear to auscultation bilaterally. No wheezes, rales, or rhonchi. No distress.    Gastrointestinal:  +BS, soft, and non-distended. Mild RUQ and mid-abdominal TTP noted. No HSM noted. No guarding or rebound. No masses appreciated.  Rectal:  Deferred  Musculoskalatal:  Symmetrical without gross deformities. Neurologic:  Alert and oriented x4;  grossly normal neurologically. Psych:  Alert and cooperative. Normal mood and affect. Heme/Lymph/Immune: No excessive bruising noted.    11/05/2017 4:10 PM   Disclaimer: This note was dictated with voice recognition software. Similar sounding words can inadvertently be transcribed and may not be corrected upon review.

## 2017-11-05 NOTE — Assessment & Plan Note (Signed)
Query possible primary biliary cholangitis based on elevated AMA and ASMA, elevated alkaline phosphatase which is persistent, and surgical pathology of his liver biopsy.  See HPI for further details.  A prescription for Urso weight-based dosing has been sent to the pharmacy.  I discussed this with him.  We will also refer him to hepatology clinic for further evaluation given cirrhosis with likely alcoholic component as well as, now, possible PBC component.  Follow-up with our office in 6 months.  Labs and imaging up-to-date.

## 2017-11-05 NOTE — Assessment & Plan Note (Signed)
The patient does have vague right upper quadrant and generalized abdominal pain.  This is undergone extensive workup and no etiology has been identified.  Possibly related to newly diagnosed likely PVC.  Continue to monitor, continue current medications.  Follow-up in 6 months.  Referral to liver center.

## 2017-11-05 NOTE — Progress Notes (Signed)
From pathology report: the findings are of granulomatous hepatitis with differentials mainly to include PBC in light of his positive AMA status. Other differentials include sarcoidosis, drug-induced liver disease, idiopathic granulomatosis. Typically, liver biopsy is not necessary to diagnose PBC if straight forward: he has a positive AMA and elevated alk phos. He has been without alcohol since Sept 2017. I would recommend starting Urso 13/15 mg/kg daily (can be in 2 divided doses), and referral to Hepatology just to establish relationship and follow. His MELD score is not high enough for transplant, but if there is anything else contributing to chronic liver disease, this can be sorted out. Less likely dealing with sarcoidosis. The only thing that has not been done is an MRCP, but AMA is not typically elevated with PSC. I have discussed with Randall Hiss, who will be seeing him today.

## 2017-11-05 NOTE — Assessment & Plan Note (Signed)
Alcoholic liver cirrhosis, labs up-to-date.  Most recent meld score = 7, Child-Pugh A.  Has abstained from alcohol for nearly 2 years.  Labs and imaging up-to-date.  Imaging with decreased ascites, no acute process or suspicious liver lesions.  On diuretics his lower extremity edema has improved significantly.  Recommend he continue diuretics, follow-up in 6 months, referral to liver center, call with any questions or concerns.  Start Urso as per above.

## 2017-11-05 NOTE — Patient Instructions (Addendum)
1. We will check the status of your patient assistance paperwork for Dexilant medications.  Continue Protonix in the meantime. 2. We will send the medication to your pharmacy called Urso.  Further directions will be sent with the medication. 3. We will refer you to a liver specialist. 4. Follow-up with [place] in 6 months. 5. Call us if you have any questions or concerns.   It was great seeing you today!!!  I hope you have a wonderful summer!!!    At [facility] we value your feedback. You may receive a survey about your visit today. Please share your experience as we strive to create trusting relationships with our patients to provide genuine, compassionate, quality care.

## 2017-11-06 ENCOUNTER — Other Ambulatory Visit: Payer: Self-pay | Admitting: Gastroenterology

## 2017-11-06 ENCOUNTER — Other Ambulatory Visit: Payer: Self-pay

## 2017-11-06 DIAGNOSIS — K7031 Alcoholic cirrhosis of liver with ascites: Secondary | ICD-10-CM

## 2017-11-06 MED ORDER — URSODIOL 500 MG PO TABS: 500.0000 mg | ORAL_TABLET | Freq: Two times a day (BID) | ORAL | 5 refills | Status: DC

## 2017-11-06 NOTE — Progress Notes (Signed)
Pt was notified.  

## 2017-11-06 NOTE — Progress Notes (Signed)
cc'ed to pcp °

## 2017-11-06 NOTE — Progress Notes (Unsigned)
Please let patient know that Thomas Pratt was sent to [facility] for BID dosing.

## 2017-11-06 NOTE — Telephone Encounter (Signed)
Pt was seen in office this week. Concerns were addressed.  Closing note out.

## 2017-11-18 ENCOUNTER — Telehealth: Payer: Self-pay

## 2017-11-18 DIAGNOSIS — K7031 Alcoholic cirrhosis of liver with ascites: Secondary | ICD-10-CM

## 2017-11-18 NOTE — Telephone Encounter (Signed)
Called [facility] to f/u on referral. They spoke to pt 11/12/17, awaiting for MD to review clinical notes and respond.

## 2017-11-19 ENCOUNTER — Telehealth: Payer: Self-pay

## 2017-11-19 NOTE — Telephone Encounter (Signed)
Received a letter from [employer] [place] pt assistance forms. Pts Dexilant medication has been approved through the program through 07/21/18. Pt should receive the medication in the next few days per the letter received. LMOM, pt was notified.

## 2017-11-20 NOTE — Telephone Encounter (Signed)
Claiborne Billings at [place] called office and LMOVM. Referral was declined. She advised appeals are done on a peer-to-peer basis via email with [facility]. Herbalist email address is [email or web address].

## 2017-12-05 ENCOUNTER — Other Ambulatory Visit (HOSPITAL_COMMUNITY)
Admission: RE | Admit: 2017-12-05 | Discharge: 2017-12-05 | Disposition: A | Discharge status: Home / Self Care | Payer: Medicaid Other | Source: Ambulatory Visit | Attending: Gastroenterology | Admitting: Gastroenterology

## 2017-12-05 ENCOUNTER — Ambulatory Visit (INDEPENDENT_AMBULATORY_CARE_PROVIDER_SITE_OTHER): Payer: Medicaid Other | Admitting: Gastroenterology

## 2017-12-05 ENCOUNTER — Encounter: Payer: Self-pay | Admitting: Gastroenterology

## 2017-12-05 VITALS — BP 115/67 | HR 74 | Temp 97.0°F | Ht 68.0 in | Wt 169.0 lb

## 2017-12-05 DIAGNOSIS — K7460 Unspecified cirrhosis of liver: Secondary | ICD-10-CM | POA: Diagnosis present

## 2017-12-05 DIAGNOSIS — R609 Edema, unspecified: Secondary | ICD-10-CM | POA: Insufficient documentation

## 2017-12-05 LAB — CBC WITH DIFFERENTIAL/PLATELET
BASOS PCT: 0 %
Basophils Absolute: 0 10*3/uL (ref 0.0–0.1)
EOS ABS: 0.1 10*3/uL (ref 0.0–0.7)
EOS PCT: 1 %
HCT: 39.9 % (ref 39.0–52.0)
Hemoglobin: 13.5 g/dL (ref 13.0–17.0)
Lymphocytes Relative: 25 %
Lymphs Abs: 1.6 10*3/uL (ref 0.7–4.0)
MCH: 32.8 pg (ref 26.0–34.0)
MCHC: 33.8 g/dL (ref 30.0–36.0)
MCV: 96.8 fL (ref 78.0–100.0)
Monocytes Absolute: 0.4 10*3/uL (ref 0.1–1.0)
Monocytes Relative: 6 %
NEUTROS PCT: 68 %
Neutro Abs: 4.4 10*3/uL (ref 1.7–7.7)
PLATELETS: ADEQUATE 10*3/uL (ref 150–400)
RBC: 4.12 MIL/uL — ABNORMAL LOW (ref 4.22–5.81)
RDW: 13.6 % (ref 11.5–15.5)
WBC: 6.5 10*3/uL (ref 4.0–10.5)

## 2017-12-05 LAB — COMPREHENSIVE METABOLIC PANEL
ALBUMIN: 3.6 g/dL (ref 3.5–5.0)
ALK PHOS: 145 U/L — AB (ref 38–126)
ALT: 27 U/L (ref 17–63)
ANION GAP: 7 (ref 5–15)
AST: 48 U/L — ABNORMAL HIGH (ref 15–41)
BUN: 10 mg/dL (ref 6–20)
CALCIUM: 9.4 mg/dL (ref 8.9–10.3)
CO2: 28 mmol/L (ref 22–32)
CREATININE: 0.66 mg/dL (ref 0.61–1.24)
Chloride: 104 mmol/L (ref 101–111)
GFR calc non Af Amer: >60 mL/min (ref >60)
GLUCOSE: 171 mg/dL — AB (ref 65–99)
Potassium: 3.7 mmol/L (ref 3.5–5.1)
SODIUM: 139 mmol/L (ref 135–145)
TOTAL PROTEIN: 8 g/dL (ref 6.5–8.1)
Total Bilirubin: 1 mg/dL (ref 0.3–1.2)

## 2017-12-05 LAB — PROTIME-INR
INR: 1.1
Prothrombin Time: 14.1 seconds (ref 11.4–15.2)

## 2017-12-05 NOTE — Assessment & Plan Note (Signed)
57 y/o male with cirrhosis in setting of prior etoh abuse but now with concern for PBC based on positive AMA, elevated alk phos and biopsy (granulomatous hepatitis with differential including PBC). Referral denied to [facility] clinic. Patient is uninsured and reports being denied Medicaid, disability denied once and waiting on appeal. Presents today with complaints of fatigue, swelling/edema. Minimal lower extremity edema on exam, minimal ascites as well. Weight is up clearly 13 pounds since his last office visit. Continues to complain of some ruq/epig pain/early satiety that has been extensively evaluated.   Need to update labs to check for decline in hepatic function. LE edema may be related to dietary indiscretions. We discussed obvious foods he is consuming with too much salt. He will continue current diuretic regimen until labs received and then we can decide if diuretics can be adjusted.   We will continue to look for options for Liver clinic referral for him. Encouraged him to continue to exhaust all options regarding his Medicaid/Disability in case need for liver transplant arises in the future.

## 2017-12-05 NOTE — Progress Notes (Signed)
Primary Care Physician: Caren Macadam, MD  Primary Gastroenterologist:  Garfield Cornea, MD   Chief Complaint  Patient presents with  . abdominal swelling    x 4 days, legs/ankles as well    HPI: Thomas Pratt is a 57 y.o. male here for semi-urgent office visit for concerns about abdominal swelling and lower extremity edema. Last seen on 11/05/17. H/O cirrhosis presumably due to prior etoh abuse (no etoh in over two years at this point with last drink 03/2016) however, he has positive AMA, ASMA, liver biopsy with granulomatous hepatitis with differentials including PBC, sarcoidosis, idiopathic granulomatosis, drug-induce liver disease. Given elevated alk phos and positive AMA, patient was started on Urso for presumed PBC. He was referred to [facility] liver clinic but his referral was denied as of 11/20/17.   He had his gallbladder removed in 08/2017. He had inguinal hernia repair back in 06/2017. EGD/TCS 02/2017 with tubular adenoma removed from colon, grade 1 esophageal varices, erosive esophagitis, portal gastropathy. CT A/P with contrast 09/2017 with no hepatoma, no acute findings, stable cirrhosis with moderate volume ascites.  Patient reports feeling worse over the past several weeks. More fatigue than usual. No SOB. Some discomfort in epigastric and ruq area but not really sure if related to meals or movement. Nothing makes better. Cannot eat as much as he used too. Has complained of early satiety for months. No vomiting. Does consume hotdogs, bologna. Likes a salt shaker too but trying to refrain. Eats tacos. Typically eats something for breakfast and then at night. No heartburn. BM regular. No melnea, brbpr. No itchin g. Increased bruising noted.   Weighed 147 pounds in 10/2016 Weighed 160 pounds in 04/2017 Weighed 156 pounds in 10/2017 Weighed 169 pounds today.   Current Outpatient Medications  Medication Sig Dispense Refill  . furosemide (LASIX) 40 MG tablet Take 1 tablet (40 mg  total) by mouth daily. 30 tablet 5  . lidocaine (XYLOCAINE) 2 % solution Use as directed 15 mLs in the mouth or throat every 6 (six) hours as needed (abdominal pain). 100 mL 3  . lisinopril (PRINIVIL,ZESTRIL) 5 MG tablet Take 1 tablet (5 mg total) by mouth daily. 30 tablet 3  . nadolol (CORGARD) 40 MG tablet Take 1 tablet (40 mg total) by mouth daily. And 1/2 tablet in evening (Patient taking differently: Take 20-40 mg by mouth See admin instructions. Take 40 mg in the morning And 1/2 tablet (20 mg) in evening) 60 tablet 3  . pantoprazole (PROTONIX) 40 MG tablet Take 40 mg by mouth daily.    Marland Kitchen spironolactone (ALDACTONE) 100 MG tablet Take one in the morning and 1/2 in the afternoon 45 tablet 1  . ursodiol (ACTIGALL) 500 MG tablet Take 1 tablet (500 mg total) by mouth 2 (two) times daily. 60 tablet 5  . Vitamin D, Ergocalciferol, (DRISDOL) 50000 units CAPS capsule Take 1 capsule (50,000 Units total) by mouth every 7 (seven) days. 12 capsule 0   No current facility-administered medications for this visit.     Allergies as of 12/05/2017  . (No Known Allergies)    ROS:  General: Negative for anorexia, weight loss, fever, chills, ++fatigue, weakness. ENT: Negative for hoarseness, difficulty swallowing , nasal congestion. CV: Negative for chest pain, angina, palpitations, dyspnea on exertion, peripheral edema.  Respiratory: Negative for dyspnea at rest, dyspnea on exertion, cough, sputum, wheezing.  GI: See history of present illness. GU:  Negative for dysuria, hematuria, urinary incontinence, urinary frequency, nocturnal urination.  Endo: Negative  for unusual weight change. See hpi   Physical Examination:   BP 115/67   Pulse 74   Temp (!) 97 F (36.1 C) (Oral)   Ht _0  (1.727 m)   Wt 169 lb (76.7 kg)   BMI 25.70 kg/m   General: Well-nourished, well-developed. Appears comfortable and NAD. Eyes: No icterus. Mouth: Oropharyngeal mucosa moist and pink , no lesions erythema or  exudate. Lungs: Clear to auscultation bilaterally.  Heart: Regular rate and rhythm, no murmurs rubs or gallops.  Abdomen: Bowel sounds are normal, slight distention, mild to moderate tenderness epigastrium/ruq. no hepatosplenomegaly or masses, no abdominal bruits or hernia , no rebound or guarding.   Extremities: 1+ edema to mid calves bilaterally. No clubbing or deformities. Neuro: Alert and oriented x 4   Skin: Warm and dry, no jaundice.   Psych: Alert and cooperative, normal mood and affect.  Labs:  Lab Results  Component Value Date   CREATININE 1.00 10/30/2017   BUN 20 10/30/2017   NA 132 (L) 10/30/2017   K 4.8 10/30/2017   CL 98 10/30/2017   CO2 27 10/30/2017   Lab Results  Component Value Date   WBC 7.1 10/14/2017   HGB 13.6 10/14/2017   HCT 40.6 10/14/2017   MCV 94.0 10/14/2017   PLT 242 10/14/2017   Lab Results  Component Value Date   INR 1.08 10/14/2017   INR 1.00 08/28/2017   INR 1.10 07/18/2017   Lab Results  Component Value Date   ALT 38 10/14/2017   AST 47 (H) 10/14/2017   ALKPHOS 166 (H) 10/14/2017   BILITOT 0.7 10/14/2017   Lab Results  Component Value Date   INR 1.08 10/14/2017   INR 1.00 08/28/2017   INR 1.10 07/18/2017    Imaging Studies: No results found.

## 2017-12-05 NOTE — Telephone Encounter (Signed)
Somehow I didn't see this. Lets try [facility] and see if they can take him on. I'm pretty sure [facility]) will not take uninsured patients.

## 2017-12-05 NOTE — Patient Instructions (Signed)
Please have your labs done. If anything significant, I will call you today; otherwise, we will call you Monday.   Please limit hotdogs, bologna or other sandwich type meats especially other the weekend. Avoid frozen dinners. All of these items have a lot of salt.  We will look into other options for [facility] referrals since [facility] declined the referral.

## 2017-12-05 NOTE — Telephone Encounter (Signed)
Patient seen today in the office for similar urgent office visit for edema. I note that patient had been referred to [facility] and referral was declined. Note routed on 11/20/2017 to [facility].  Martina/EG --> what is the status of this declined referral? Has decision been made for referral to another center. Patient is uninsured.

## 2017-12-06 LAB — AFP TUMOR MARKER: AFP, Serum, Tumor Marker: 2.3 ng/mL (ref 0.0–8.3)

## 2017-12-08 ENCOUNTER — Other Ambulatory Visit: Payer: Self-pay

## 2017-12-08 DIAGNOSIS — K7460 Unspecified cirrhosis of liver: Secondary | ICD-10-CM

## 2017-12-08 NOTE — Progress Notes (Signed)
cc'ed to pcp °

## 2017-12-16 NOTE — Addendum Note (Signed)
Addended by: Hassan Rowan on: 12/16/2017 02:57 PM   Modules accepted: Orders

## 2017-12-16 NOTE — Telephone Encounter (Signed)
Referral faxed to [place].

## 2017-12-17 ENCOUNTER — Ambulatory Visit: Payer: Self-pay | Admitting: Gastroenterology

## 2017-12-19 ENCOUNTER — Encounter (HOSPITAL_COMMUNITY): Payer: Self-pay | Admitting: Emergency Medicine

## 2017-12-19 ENCOUNTER — Other Ambulatory Visit: Payer: Self-pay

## 2017-12-19 ENCOUNTER — Emergency Department (HOSPITAL_COMMUNITY)
Admission: EM | Admit: 2017-12-19 | Discharge: 2017-12-19 | Disposition: A | Discharge status: Home / Self Care | Payer: Medicaid Other | Attending: Emergency Medicine | Admitting: Emergency Medicine

## 2017-12-19 ENCOUNTER — Emergency Department (HOSPITAL_COMMUNITY): Payer: Medicaid Other

## 2017-12-19 DIAGNOSIS — R42 Dizziness and giddiness: Secondary | ICD-10-CM | POA: Diagnosis not present

## 2017-12-19 DIAGNOSIS — E119 Type 2 diabetes mellitus without complications: Secondary | ICD-10-CM | POA: Insufficient documentation

## 2017-12-19 DIAGNOSIS — I10 Essential (primary) hypertension: Secondary | ICD-10-CM | POA: Insufficient documentation

## 2017-12-19 DIAGNOSIS — R1013 Epigastric pain: Secondary | ICD-10-CM | POA: Diagnosis not present

## 2017-12-19 DIAGNOSIS — Z79899 Other long term (current) drug therapy: Secondary | ICD-10-CM | POA: Insufficient documentation

## 2017-12-19 DIAGNOSIS — R079 Chest pain, unspecified: Secondary | ICD-10-CM | POA: Insufficient documentation

## 2017-12-19 DIAGNOSIS — R1084 Generalized abdominal pain: Secondary | ICD-10-CM

## 2017-12-19 LAB — CBC
HCT: 36.9 % — ABNORMAL LOW (ref 39.0–52.0)
HEMOGLOBIN: 12.1 g/dL — AB (ref 13.0–17.0)
MCH: 31.4 pg (ref 26.0–34.0)
MCHC: 32.8 g/dL (ref 30.0–36.0)
MCV: 95.8 fL (ref 78.0–100.0)
Platelets: 268 10*3/uL (ref 150–400)
RBC: 3.85 MIL/uL — AB (ref 4.22–5.81)
RDW: 13.1 % (ref 11.5–15.5)
WBC: 5.7 10*3/uL (ref 4.0–10.5)

## 2017-12-19 LAB — COMPREHENSIVE METABOLIC PANEL
ALBUMIN: 3.5 g/dL (ref 3.5–5.0)
ALT: 23 U/L (ref 17–63)
AST: 46 U/L — AB (ref 15–41)
Alkaline Phosphatase: 152 U/L — ABNORMAL HIGH (ref 38–126)
Anion gap: 8 (ref 5–15)
BUN: 10 mg/dL (ref 6–20)
CHLORIDE: 103 mmol/L (ref 101–111)
CO2: 25 mmol/L (ref 22–32)
Calcium: 9.4 mg/dL (ref 8.9–10.3)
Creatinine, Ser: 0.78 mg/dL (ref 0.61–1.24)
GFR calc Af Amer: >60 mL/min (ref >60)
Glucose, Bld: 186 mg/dL — ABNORMAL HIGH (ref 65–99)
POTASSIUM: 3.7 mmol/L (ref 3.5–5.1)
Sodium: 136 mmol/L (ref 135–145)
Total Bilirubin: 0.9 mg/dL (ref 0.3–1.2)
Total Protein: 8.2 g/dL — ABNORMAL HIGH (ref 6.5–8.1)

## 2017-12-19 LAB — I-STAT TROPONIN, ED: TROPONIN I, POC: 0 ng/mL (ref 0.00–0.08)

## 2017-12-19 LAB — LIPASE, BLOOD: LIPASE: 49 U/L (ref 11–51)

## 2017-12-19 LAB — TROPONIN I

## 2017-12-19 MED ORDER — SODIUM CHLORIDE 0.9 % IV BOLUS
500.0000 mL | Freq: Once | INTRAVENOUS | Status: AC
  Administered 2017-12-19: 500 mL via INTRAVENOUS

## 2017-12-19 MED ORDER — KETOROLAC TROMETHAMINE 30 MG/ML IJ SOLN
30.0000 mg | Freq: Once | INTRAMUSCULAR | Status: AC
  Administered 2017-12-19: 30 mg via INTRAVENOUS
  Filled 2017-12-19: qty 1

## 2017-12-19 MED ORDER — GI COCKTAIL ~~LOC~~
30.0000 mL | Freq: Once | ORAL | Status: AC
  Administered 2017-12-19: 30 mL via ORAL
  Filled 2017-12-19: qty 30

## 2017-12-19 NOTE — ED Notes (Signed)
Generalized pain chest pain and body aches. Patient on cardiac monitor NSR vital signs stable

## 2017-12-19 NOTE — ED Provider Notes (Signed)
[facility] EMERGENCY DEPARTMENT Provider Note   CSN: [id number] Arrival date & time: 12/19/17  1627     History   Chief Complaint Chief Complaint  Patient presents with  . Chest Pain    HPI Thomas Pratt is a 57 y.o. male.  He presents today with complaint of substernal chest pain that was acute onset around 1 PM today comes and goes moderate severity.  It is associated with feeling weak all over and feeling tingling in his arms and his legs.  He states he has baseline abdominal pain that is been going on for 2 or 3 years that his doctors working up and have diagnosed him with cirrhosis.  He is never had a cardiac event that he knows of .    The history is provided by the patient.  Chest Pain   This is a new problem. The current episode started 3 to 5 hours ago. The problem has been gradually improving. The pain is associated with rest. The pain is present in the substernal region. The pain is moderate. The quality of the pain is described as pressure-like. The pain radiates to the epigastrium. Associated symptoms include abdominal pain, dizziness, malaise/fatigue, numbness, shortness of breath and weakness. Pertinent negatives include no back pain, no cough, no diaphoresis, no fever, no hemoptysis, no lower extremity edema, no nausea, no palpitations, no sputum production, no syncope and no vomiting. He has tried rest for the symptoms. The treatment provided mild relief. Risk factors include male gender.  His past medical history is significant for hypertension.  Pertinent negatives for past medical history include no seizures.    Past Medical History:  Diagnosis Date  . Cirrhosis ([place])    likely ETOH use     Patient Active Problem List   Diagnosis Date Noted  . Edema 12/05/2017  . Abdominal pain 11/05/2017  . Primary biliary cholangitis ([place]) 11/05/2017  . Type 2 diabetes mellitus with other specified complication ([place]) 87/56/4332  . Abnormal LFTs 09/16/2017  .  Calculus of gallbladder without cholecystitis without obstruction   . Dyspepsia 07/31/2017  . Inguinal hernia 11/14/2016  . Malnutrition of moderate degree 05/22/2016  . Lower extremity edema   . Cirrhosis ([place]) 05/21/2016  . Hypoalbuminemia 05/21/2016  . Hyponatremia 05/21/2016  . Hypokalemia 05/21/2016  . HTN (hypertension) 05/21/2016  . Ascites 05/21/2016  . ALC (alcoholic liver cirrhosis) ([place]) 05/21/2016    Past Surgical History:  Procedure Laterality Date  . CHOLECYSTECTOMY N/A 09/01/2017   Procedure: LAPAROSCOPIC CHOLECYSTECTOMY;  Surgeon: Aviva Signs, MD;  Location: [facility] ORS;  Service: General;  Laterality: N/A;  . COLONOSCOPY WITH PROPOFOL N/A 03/06/2017   Dr. Gala Romney: tubular adenoma removed, next TCS 5 years if health permits.   . ESOPHAGOGASTRODUODENOSCOPY (EGD) WITH PROPOFOL N/A 03/06/2017   Dr. Gala Romney: grade 1 esophageal varices, erosive esophagitis, portal gastropathy.   Marland Kitchen HERNIA REPAIR Right    RIH  . INGUINAL HERNIA REPAIR Right 07/11/2017   Procedure: HERNIA REPAIR INGUINAL ADULT WITH MESH;  Surgeon: Aviva Signs, MD;  Location: [facility] ORS;  Service: General;  Laterality: Right;  patient knows to arrive at 7:30  . IR TRANSCATHETER BX  10/14/2017  . IR US GUIDE VASC ACCESS RIGHT  10/14/2017  . IR VENOGRAM HEPATIC WO HEMODYNAMIC EVALUATION  10/14/2017  . POLYPECTOMY  03/06/2017   Procedure: POLYPECTOMY;  Surgeon: Daneil Dolin, MD;  Location: [facility] ENDO SUITE;  Service: Endoscopy;;  ascending colon;        Home Medications    Prior  to Admission medications   Medication Sig Start Date End Date Taking? Authorizing Provider  furosemide (LASIX) 40 MG tablet Take 1 tablet (40 mg total) by mouth daily. 08/22/17   Mahala Menghini, PA-C  lidocaine (XYLOCAINE) 2 % solution Use as directed 15 mLs in the mouth or throat every 6 (six) hours as needed (abdominal pain). 10/01/17   Annitta Needs, NP  lisinopril (PRINIVIL,ZESTRIL) 5 MG tablet Take 1 tablet (5 mg total) by mouth daily. 09/26/17    Caren Macadam, MD  nadolol (CORGARD) 40 MG tablet Take 1 tablet (40 mg total) by mouth daily. And 1/2 tablet in evening Patient taking differently: Take 20-40 mg by mouth See admin instructions. Take 40 mg in the morning And 1/2 tablet (20 mg) in evening 04/25/17   Annitta Needs, NP  pantoprazole (PROTONIX) 40 MG tablet Take 40 mg by mouth daily.    [provider]  spironolactone (ALDACTONE) 100 MG tablet Take one in the morning and 1/2 in the afternoon 10/30/17   Caren Macadam, MD  ursodiol (ACTIGALL) 500 MG tablet Take 1 tablet (500 mg total) by mouth 2 (two) times daily. 11/06/17   Annitta Needs, NP  Vitamin D, Ergocalciferol, (DRISDOL) 50000 units CAPS capsule Take 1 capsule (50,000 Units total) by mouth every 7 (seven) days. 10/30/17   Caren Macadam, MD    Family History Family History  Problem Relation Age of Onset  . COPD Mother   . Liver disease Neg Hx   . Colon cancer Neg Hx   . Colon polyps Neg Hx     Social History Social History   Tobacco Use  . Smoking status: Never Smoker  . Smokeless tobacco: Never Used  Substance Use Topics  . Alcohol use: No    Comment: Quit September 2017, heavy alcohol abuse previously  . Drug use: No     Allergies   Patient has no known allergies.   Review of Systems Review of Systems  Constitutional: Positive for malaise/fatigue. Negative for chills, diaphoresis and fever.  HENT: Negative for ear pain and sore throat.   Eyes: Negative for pain and visual disturbance.  Respiratory: Positive for shortness of breath. Negative for cough, hemoptysis and sputum production.   Cardiovascular: Positive for chest pain. Negative for palpitations and syncope.  Gastrointestinal: Positive for abdominal pain. Negative for nausea and vomiting.  Genitourinary: Negative for dysuria and hematuria.  Musculoskeletal: Negative for arthralgias and back pain.  Skin: Negative for color change and rash.  Neurological: Positive for dizziness,  weakness and numbness. Negative for seizures and syncope.  All other systems reviewed and are negative.    Physical Exam Updated Vital Signs BP 140/73   Pulse 80   Temp 98 F (36.7 C)   Resp 18   Ht 5\' 8"  (1.727 m)   Wt 73 kg (161 lb)   SpO2 99%   BMI 24.48 kg/m   Physical Exam  Constitutional: He appears well-developed and well-nourished.  HENT:  Head: Normocephalic and atraumatic.  Eyes: Conjunctivae are normal.  Neck: Neck supple.  Cardiovascular: Normal rate, regular rhythm and normal pulses.  No murmur heard. Pulmonary/Chest: Effort normal and breath sounds normal. No respiratory distress.  Abdominal: Soft. There is no tenderness.  Musculoskeletal: Normal range of motion. He exhibits no edema.       Right lower leg: Normal. He exhibits no tenderness and no edema.       Left lower leg: Normal. He exhibits no tenderness and no edema.  Neurological: He is alert.  Skin: Skin is warm and dry. Capillary refill takes less than 2 seconds.  Psychiatric: He has a normal mood and affect.  Nursing note and vitals reviewed.    ED Treatments / Results  Labs (all labs ordered are listed, but only abnormal results are displayed) Labs Reviewed  CBC - Abnormal; Notable for the following components:      Result Value   RBC 3.85 (*)    Hemoglobin 12.1 (*)    HCT 36.9 (*)    All other components within normal limits  COMPREHENSIVE METABOLIC PANEL - Abnormal; Notable for the following components:   Glucose, Bld 186 (*)    Total Protein 8.2 (*)    AST 46 (*)    Alkaline Phosphatase 152 (*)    All other components within normal limits  LIPASE, BLOOD  TROPONIN I  I-STAT TROPONIN, ED    EKG EKG Interpretation  Date/Time:  Friday Dec 19 2017 16:32:20 EDT Ventricular Rate:  76 PR Interval:  150 QRS Duration: 82 QT Interval:  410 QTC Calculation: 461 R Axis:   -17 Text Interpretation:  Normal sinus rhythm Moderate voltage criteria for LVH, may be normal variant Borderline  ECG similar to prior 3/18 Confirmed by Aletta Edouard [phone]) on 12/19/2017 4:54:21 PM   Radiology Dg Chest 2 View  Result Date: 12/19/2017 CLINICAL DATA:  Chest pain, shortness of breath, weakness EXAM: CHEST - 2 VIEW COMPARISON:  09/28/2016 FINDINGS: Lungs are clear.  No pleural effusion or pneumothorax. The heart is normal in size. Visualized osseous structures are within normal limits. Cholecystectomy clips. IMPRESSION: Normal chest radiographs. Electronically Signed   By: Julian Hy M.D.   On: 12/19/2017 17:50    Procedures Procedures (including critical care time)  Medications Ordered in ED Medications - No data to display   Initial Impression / Assessment and Plan / ED Course  I have reviewed the triage vital signs and the nursing notes.  Pertinent labs & imaging results that were available during my care of the patient were reviewed by me and considered in my medical decision making (see chart for details).  Clinical Course as of Dec 22 1703  Fri Dec 19, 2017  1856 Reevaluated patient, he states he feels about the same.  His lab work so far is been unremarkable.  I have ordered him a little bit of IV fluids and the thought that may be he is slightly over diuresed.   [MB]  2106 GI cocktail has not changed the patient's symptoms at all.  He states now the doctors never seem to be able to find anything with all of his pain symptoms.  Ultimately I am not finding any indication to admit the patient to the hospital so likely he will be discharged to follow-up with his primary care doctor.   [MB]    Clinical Course User Index [MB] Hayden Rasmussen, MD    Final Clinical Impressions(s) / ED Diagnoses   Final diagnoses:  Nonspecific chest pain  Generalized abdominal pain  Dizziness    ED Discharge Orders    None       Hayden Rasmussen, MD 12/21/17 [id number]

## 2017-12-19 NOTE — ED Triage Notes (Signed)
Pt c/o cp/dizzy/generalized weakness since 1pm.

## 2017-12-19 NOTE — Discharge Instructions (Signed)
You were evaluated in the emergency department for chest pain abdominal pain and dizziness.  You had an EKG chest x-ray and lab work and an obvious cause of your symptoms was not identified.  You should follow-up with your regular doctor and continue to take your regular medicines.  Please return to the emergency department if any worsening symptoms.

## 2017-12-22 NOTE — Telephone Encounter (Signed)
Called [facility] to f/u on referral. Thomas Pratt advised pt had been registered, referral was sent to scheduler. Scheduler will call pt in couple weeks to schedule appt.

## 2017-12-24 ENCOUNTER — Telehealth: Payer: Self-pay | Admitting: Family Medicine

## 2017-12-24 NOTE — Telephone Encounter (Signed)
**  ADD TO LAST MSG**  You do not have available appt until 12/31/17 unless you have a cancellation.

## 2017-12-24 NOTE — Telephone Encounter (Signed)
Patient is also having pain/pressure when he urinates.

## 2017-12-24 NOTE — Telephone Encounter (Signed)
Patient went to ER Friday around lunch for chest pain, light headedness, swelling in his legs/ feet, says he forgets what he is talking about while he is talking, and that he just  does not feel right at all. Can this wait until your next available appt thurs ? Or do I need to bring him in sooner. He is expecting me to call back today. Please advise.

## 2017-12-24 NOTE — Telephone Encounter (Signed)
Scheduled 12/31/17, advised to go to urgent are if needed.

## 2017-12-24 NOTE — Telephone Encounter (Signed)
I do not know why patient is having the symptoms.  We can try to get him in for a visit for evaluation.  If he is having symptoms of lightheadedness, nausea, vomiting, he should go to the emergency department.  He can also go to urgent care to be checked for his urinary symptoms.  Please advise patient that he would need to be seen at urgent care if we do not have an appointment.

## 2017-12-25 ENCOUNTER — Encounter: Payer: Self-pay | Admitting: Family Medicine

## 2017-12-26 ENCOUNTER — Telehealth: Payer: Self-pay | Admitting: Internal Medicine

## 2017-12-26 ENCOUNTER — Encounter: Payer: Self-pay | Admitting: Family Medicine

## 2017-12-26 NOTE — Telephone Encounter (Signed)
PATIENT CALLED TO LET us KNOW THEY ACCEPTED HIM AT [facility] AS A PATIENT AND HE HAS AN APPOINTMENT NEXT MONTH

## 2017-12-29 NOTE — Telephone Encounter (Signed)
Noted routing to provider.

## 2017-12-30 NOTE — Telephone Encounter (Signed)
Noted. I am glad to hear this! FYI to [employer], who have both recently seen him.

## 2017-12-30 NOTE — Telephone Encounter (Signed)
[place]. Appt scheduled 02/16/18 with Dr. Manuella Ghazi.

## 2017-12-30 NOTE — Telephone Encounter (Signed)
Excellent news!  We certainly wish him the best of luck!!!

## 2017-12-31 ENCOUNTER — Ambulatory Visit: Payer: Self-pay | Admitting: Family Medicine

## 2018-01-05 NOTE — Telephone Encounter (Signed)
Great! Look forward to [facility] correspondence.

## 2018-01-28 ENCOUNTER — Ambulatory Visit: Payer: Self-pay | Admitting: Family Medicine

## 2018-02-12 ENCOUNTER — Encounter: Payer: Self-pay | Admitting: Family Medicine

## 2018-02-12 ENCOUNTER — Ambulatory Visit (INDEPENDENT_AMBULATORY_CARE_PROVIDER_SITE_OTHER): Payer: Medicaid Other | Admitting: Family Medicine

## 2018-02-12 ENCOUNTER — Other Ambulatory Visit (HOSPITAL_COMMUNITY)
Admission: RE | Admit: 2018-02-12 | Discharge: 2018-02-12 | Disposition: A | Discharge status: Home / Self Care | Payer: Medicaid Other | Source: Ambulatory Visit | Attending: Family Medicine | Admitting: Family Medicine

## 2018-02-12 ENCOUNTER — Other Ambulatory Visit: Payer: Self-pay

## 2018-02-12 VITALS — BP 104/60 | HR 58 | Temp 98.3°F | Resp 14 | Ht 68.0 in | Wt 153.1 lb

## 2018-02-12 DIAGNOSIS — H6122 Impacted cerumen, left ear: Secondary | ICD-10-CM | POA: Insufficient documentation

## 2018-02-12 DIAGNOSIS — K7460 Unspecified cirrhosis of liver: Secondary | ICD-10-CM | POA: Diagnosis not present

## 2018-02-12 DIAGNOSIS — R188 Other ascites: Secondary | ICD-10-CM

## 2018-02-12 DIAGNOSIS — Z79899 Other long term (current) drug therapy: Secondary | ICD-10-CM

## 2018-02-12 DIAGNOSIS — F320 Major depressive disorder, single episode, mild: Secondary | ICD-10-CM | POA: Diagnosis not present

## 2018-02-12 DIAGNOSIS — R300 Dysuria: Secondary | ICD-10-CM | POA: Insufficient documentation

## 2018-02-12 LAB — POCT URINALYSIS DIPSTICK
Bilirubin, UA: NEGATIVE
Blood, UA: NEGATIVE
Glucose, UA: NEGATIVE
Ketones, UA: NEGATIVE
LEUKOCYTES UA: NEGATIVE
NITRITE UA: NEGATIVE
PH UA: 6.5 (ref 5.0–8.0)
PROTEIN UA: NEGATIVE
Spec Grav, UA: 1.015 (ref 1.010–1.025)
UROBILINOGEN UA: 0.2 U/dL

## 2018-02-12 MED ORDER — CITALOPRAM HYDROBROMIDE 20 MG PO TABS: 20.0000 mg | ORAL_TABLET | Freq: Every day | ORAL | 3 refills | Status: DC

## 2018-02-12 NOTE — Progress Notes (Signed)
Patient ID: Thomas Pratt, male    DOB: October 06, 1960, 57 y.o.   MRN: [id number]  Chief Complaint  Patient presents with  . Abdominal Pain  . Leg Swelling    Allergies Patient has no known allergies.  Subjective:   Thomas Pratt is a 57 y.o. male who presents to [facility] today.  HPI Here for follow up. Has upcoming appointment on Monday at [facility] in the liver clinic. Reports that is still having continued abdominal pain and has been to the ED several times for this pain and also seen by gastroenterology.  He reports that the pain is not changed.  Reports that it is in the area of his liver and occurs sporadically.  Reports he feels like something pops in his abdomen and then it gets better and then it might get a little worse.  He is not experiencing the pain today.  He reports that basically he has pains all over his body on a daily basis.  When asked about his mood he reports that he might be a little bit down and depressed.  He reports that he does feel sad from time to time.  He is not sure why but does report that he can feel down and hopeless.  He denies any suicidal or homicidal ideation.  He denies any auditory or visual hallucinations.  He reports he is waiting to hear from disability and Medicaid.  He reports that he does not have any money and his activities are limited because of this.  He has family that he interacts with on a daily basis.  He reports his daughter occasionally takes him to his doctor's appointments.  He has never had any suicide attempts or ideations.  He has never been on any medications for his mood.  He does have a history of prior alcohol abuse.  Denies any current alcohol or drug use.  Reports that for the past several weeks in the morning he has felt a little twinge of burning with urination.  He reports that his urine is a darker yellow color and in the morning it has a smell to it.  He denies any nausea or vomiting.  He reports that he  does have some burning when he stops urinating in the mornings.  He has never had a urinary tract infection.  He denies any urinary urgency, hesitancy, or difficulty with his stream of urine.  He reports his bowel movements are normal.  Denies any melena or bright red blood per rectum.  Denies any hematemesis or hemoptysis.  He denies any fevers or chills.  Denies any night sweats.  He is not sexually active.  Reports he has not been sexually active in over 2 years but would like to be checked for any sexually transmitted infections.  He denies any history of prostatitis or sexually transmitted diseases.  Reports that his left ear has been bothering him a little bit and he has been trying to get a piece of wax out but he has been unsuccessful.  He would like to have his ear checked.  He denies any drainage from his ear.  Denies any sinus or facial pain.  Denies any cough or shortness of breath.    Past Medical History:  Diagnosis Date  . Cirrhosis ([place])    likely ETOH use     Past Surgical History:  Procedure Laterality Date  . CHOLECYSTECTOMY N/A 09/01/2017   Procedure: LAPAROSCOPIC CHOLECYSTECTOMY;  Surgeon: Aviva Signs, MD;  Location:  [facility] ORS;  Service: General;  Laterality: N/A;  . COLONOSCOPY WITH PROPOFOL N/A 03/06/2017   Dr. Gala Romney: tubular adenoma removed, next TCS 5 years if health permits.   . ESOPHAGOGASTRODUODENOSCOPY (EGD) WITH PROPOFOL N/A 03/06/2017   Dr. Gala Romney: grade 1 esophageal varices, erosive esophagitis, portal gastropathy.   Marland Kitchen HERNIA REPAIR Right    RIH  . INGUINAL HERNIA REPAIR Right 07/11/2017   Procedure: HERNIA REPAIR INGUINAL ADULT WITH MESH;  Surgeon: Aviva Signs, MD;  Location: [facility] ORS;  Service: General;  Laterality: Right;  patient knows to arrive at 7:30  . IR TRANSCATHETER BX  10/14/2017  . IR US GUIDE VASC ACCESS RIGHT  10/14/2017  . IR VENOGRAM HEPATIC WO HEMODYNAMIC EVALUATION  10/14/2017  . POLYPECTOMY  03/06/2017   Procedure: POLYPECTOMY;  Surgeon: Daneil Dolin, MD;  Location: [facility] ENDO SUITE;  Service: Endoscopy;;  ascending colon;    Family History  Problem Relation Age of Onset  . COPD Mother   . Liver disease Neg Hx   . Colon cancer Neg Hx   . Colon polyps Neg Hx      Social History   Socioeconomic History  . Marital status: Divorced    Spouse name: Not on file  . Number of children: Not on file  . Years of education: Not on file  . Highest education level: Not on file  Occupational History  . Occupation: unemployed  Social Needs  . Financial resource strain: Not on file  . Food insecurity:    Worry: Not on file    Inability: Not on file  . Transportation needs:    Medical: Not on file    Non-medical: Not on file  Tobacco Use  . Smoking status: Never Smoker  . Smokeless tobacco: Never Used  Substance and Sexual Activity  . Alcohol use: No    Comment: Quit September 2017, heavy alcohol abuse previously  . Drug use: No  . Sexual activity: Not Currently    Partners: Female  Lifestyle  . Physical activity:    Days per week: Not on file    Minutes per session: Not on file  . Stress: Not on file  Relationships  . Social connections:    Talks on phone: Not on file    Gets together: Not on file    Attends religious service: Not on file    Active member of club or organization: Not on file    Attends meetings of clubs or organizations: Not on file    Relationship status: Not on file  Other Topics Concern  . Not on file  Social History Narrative   Lives in [place], [place]. 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    Drank alcohol for years, quit 05/07/16. Has been sober since then. Quit b/c of health/cirrhosis.   Divorced for over 15 years.   Dating sporadically.   Has 3 daughters and 1 son live locally.   Originally from [place], moved here b/c of wife.    Eats all foods.    Wear seatbelt.   Does not drive, does not have a license, lost due to DWI.    Brought by neighbor.    Does not go to church.  Believes in God. Prays each night, per report.    Current Outpatient Medications on File Prior to Visit  Medication Sig Dispense Refill  . furosemide (LASIX) 40 MG tablet Take 1 tablet (40 mg total) by mouth daily. (Patient taking differently: Take 40 mg by  mouth 2 (two) times daily. ) 30 tablet 5  . nadolol (CORGARD) 40 MG tablet Take 1 tablet (40 mg total) by mouth daily. And 1/2 tablet in evening (Patient taking differently: Take 20-40 mg by mouth 2 (two) times daily. ) 60 tablet 3  . pantoprazole (PROTONIX) 40 MG tablet Take 40 mg by mouth daily.    Marland Kitchen spironolactone (ALDACTONE) 100 MG tablet Take one in the morning and 1/2 in the afternoon (Patient taking differently: Take 100 mg by mouth 2 (two) times daily. ) 45 tablet 1  . ursodiol (ACTIGALL) 500 MG tablet Take 1 tablet (500 mg total) by mouth 2 (two) times daily. 60 tablet 5  . Vitamin D, Ergocalciferol, (DRISDOL) 50000 units CAPS capsule Take 1 capsule (50,000 Units total) by mouth every 7 (seven) days. 12 capsule 0   No current facility-administered medications on file prior to visit.     Review of Systems  Constitutional: Positive for fatigue. Negative for activity change, appetite change, chills and fever.  HENT: Positive for ear pain. Negative for congestion, dental problem, ear discharge, facial swelling, hearing loss, mouth sores, nosebleeds, postnasal drip, rhinorrhea, sinus pressure, sinus pain, sneezing, sore throat, tinnitus, trouble swallowing and voice change.   Gastrointestinal: Negative for nausea and vomiting.  Genitourinary: Positive for dysuria. Negative for decreased urine volume, difficulty urinating, discharge, flank pain, frequency, genital sores, hematuria, penile pain, penile swelling, scrotal swelling, testicular pain and urgency.  Musculoskeletal:       Reports that he always has back pain and has had back pain for many years.  Denies any weakness in his extremities.  Reports that he always has muscle aches and  pains all over his body.  They have not changed over the past several weeks.  Nor is his back changed over the past several years.  Skin: Negative for rash.  Neurological: Negative for dizziness, facial asymmetry, speech difficulty, weakness, light-headedness and numbness.  Hematological: Negative for adenopathy. Does not bruise/bleed easily.  Psychiatric/Behavioral: Positive for decreased concentration and dysphoric mood. Negative for agitation, hallucinations, self-injury, sleep disturbance and suicidal ideas. The patient is nervous/anxious. The patient is not hyperactive.      Objective:   BP 104/60 (BP Location: Left Arm, Patient Position: Sitting, Cuff Size: Normal)   Pulse (!) 58   Temp 98.3 F (36.8 C) (Temporal)   Resp 14   Ht 5\' 8"  (1.727 m)   Wt 153 lb 1.9 oz (69.5 kg)   SpO2 99%   BMI 23.28 kg/m   Physical Exam  Constitutional: He is oriented to person, place, and time. He appears well-developed and well-nourished.  Alert and oriented.  No acute distress.  Appearance older than stated age.  Poor dentition.  HENT:  Head: Normocephalic and atraumatic.  Right Ear: Hearing, tympanic membrane, external ear and ear canal normal.  Left Ear: Hearing, tympanic membrane, external ear and ear canal normal.  Mouth/Throat: Oropharynx is clear and moist. No oropharyngeal exudate.  Left ear canal with obstructing piece of cerumen.  Ear irrigated.  Cerumen removed.  Patient was symptomatic improvement of his initial left ear discomfort.  Eyes: Pupils are equal, round, and reactive to light. EOM are normal. No scleral icterus.  Cardiovascular: Normal rate and regular rhythm.  Pulmonary/Chest: Effort normal and breath sounds normal. No respiratory distress. He has no wheezes.  Abdominal: Soft. Normal appearance and bowel sounds are normal.  Genitourinary: Rectum normal and prostate normal. Prostate is not enlarged and not tender.  Neurological: He is alert and  oriented to person, place,  and time.  Skin: Skin is warm and dry. Capillary refill takes less than 2 seconds.  Psychiatric: His speech is normal and behavior is normal. Judgment and thought content normal. His affect is blunt. He is not actively hallucinating. Cognition and memory are normal. He exhibits a depressed mood. He expresses no homicidal and no suicidal ideation. He expresses no suicidal plans and no homicidal plans. He is attentive.    Depression screen [facility] 2/9 02/12/2018 10/30/2017 09/26/2017 09/26/2017  Decreased Interest 3 0 2 0  Down, Depressed, Hopeless 1 0 1 1  PHQ - 2 Score 4 0 3 1  Altered sleeping 2 - 1 -  Tired, decreased energy 3 - 2 -  Change in appetite 2 - 0 -  Feeling bad or failure about yourself  0 - 0 -  Trouble concentrating 2 - - -  Moving slowly or fidgety/restless 2 - 1 -  Suicidal thoughts 0 - 0 -  PHQ-9 Score 15 - 7 -  Difficult doing work/chores Very difficult - - -    Assessment and Plan  1. Dysuria Patient with several week history of dysuria with first morning void.  Urinalysis in office today within normal limits.  Will send for urine culture.  Patient request check for sexually transmitted infection.  Prostate exam was within normal limits.  Uncertain etiology.  Will await for testing prior to treating patient.  If not improved or develops any worrisome symptoms will refer to urology. - POCT urinalysis dipstick - Urine Culture - Urine cytology ancillary only  2. High risk medication use Patient is on spironolactone and Lasix.  Will check potassium level today.  Continue current medications as directed. - Basic metabolic panel  3. Depression, major, single episode, mild (HCC) Trial of Celexa.  Risks versus benefits discussed with patient.  Possible side effects discussed with patient.  He will follow-up in 1 month or sooner if needed.  Defers therapy at this time.  He will call with any questions or concerns.  Suicide precautions given. Suicide risks evaluated and documented  in note if present or in the area below.  Patient does not have/denies the following risks: previous suicide attempts, family history of suicide, access to lethal means, prior history of psychiatric disorder, history of alcohol or substance abuse disorder, recent loss of a loved one, or severe hopelessness. Patient denies access to firearms or if present will have removed from home/access.    Patient specifically denies suicide ideation. Patient has access/information to healthcare contacts if situation or mood changes where patient is a risk to self or others or mood becomes unstable.   During the encounter, the patient had good eye contact and firm handshake regarding safety contract and agreement to seek help if mood worsens and not to harm self.   Patient understands the treatment plan and is in agreement. Agrees to keep follow up and call prior or return to clinic if needed.   - citalopram (CELEXA) 20 MG tablet; Take 1 tablet (20 mg total) by mouth daily.  Dispense: 30 tablet; Refill: 3  4.  Cerumen impaction Left ear was irrigated and single piece of cerumen was removed.  Patient had symptomatic improvement in the ear pain/irritation.  Tympanic membrane was visualized after irrigation and intact and clear.  No lesions or problems with external ear canal.  Ear care discussed with patient.  Patient is to keep his scheduled follow-up visits with gastroenterology and liver clinic at [facility].  He was counseled concerning worrisome signs and symptoms of abdominal pain and told if they develop to go to the emergency department. Discussed with patient that we will let him know when we get his potassium and urine studies back.  If he was to develop worsening urinary symptoms or any problem in the meantime he should call, return to clinic, or contact medical help.  He voiced understanding.   INo follow-ups on file. Caren Macadam, MD 02/12/2018

## 2018-02-13 LAB — URINE CULTURE
MICRO NUMBER:: 90883364
RESULT: NO GROWTH
SPECIMEN QUALITY:: ADEQUATE

## 2018-02-13 LAB — BASIC METABOLIC PANEL
BUN: 7 mg/dL (ref 7–25)
CHLORIDE: 102 mmol/L (ref 98–110)
CO2: 28 mmol/L (ref 20–32)
Calcium: 10.2 mg/dL (ref 8.6–10.3)
Creat: 0.8 mg/dL (ref 0.70–1.33)
GLUCOSE: 106 mg/dL — AB (ref 65–99)
Potassium: 4.6 mmol/L (ref 3.5–5.3)
SODIUM: 137 mmol/L (ref 135–146)

## 2018-02-13 LAB — URINE CYTOLOGY ANCILLARY ONLY
Chlamydia: NEGATIVE
Neisseria Gonorrhea: NEGATIVE
TRICH (WINDOWPATH): NEGATIVE

## 2018-02-17 ENCOUNTER — Telehealth: Payer: Self-pay | Admitting: Family Medicine

## 2018-02-17 NOTE — Telephone Encounter (Signed)
Pt is returning call to Nurse.

## 2018-02-18 NOTE — Telephone Encounter (Signed)
Pt aware of urine culture results

## 2018-02-25 ENCOUNTER — Telehealth: Payer: Self-pay | Admitting: Nurse Practitioner

## 2018-02-25 ENCOUNTER — Encounter: Payer: Self-pay | Admitting: *Deleted

## 2018-02-25 ENCOUNTER — Ambulatory Visit (INDEPENDENT_AMBULATORY_CARE_PROVIDER_SITE_OTHER): Payer: Self-pay | Admitting: Nurse Practitioner

## 2018-02-25 ENCOUNTER — Encounter: Payer: Self-pay | Admitting: Nurse Practitioner

## 2018-02-25 VITALS — BP 127/73 | HR 63 | Temp 97.0°F | Ht 68.0 in | Wt 159.0 lb

## 2018-02-25 DIAGNOSIS — K743 Primary biliary cirrhosis: Secondary | ICD-10-CM

## 2018-02-25 DIAGNOSIS — K7460 Unspecified cirrhosis of liver: Secondary | ICD-10-CM

## 2018-02-25 DIAGNOSIS — Z87898 Personal history of other specified conditions: Secondary | ICD-10-CM

## 2018-02-25 DIAGNOSIS — F1011 Alcohol abuse, in remission: Secondary | ICD-10-CM

## 2018-02-25 MED ORDER — LACTULOSE ENCEPHALOPATHY 10 GM/15ML PO SOLN: 10.0000 g | Freq: Two times a day (BID) | ORAL | 2 refills | Status: DC | PRN

## 2018-02-25 MED ORDER — RIFAXIMIN 550 MG PO TABS: 550.0000 mg | ORAL_TABLET | Freq: Two times a day (BID) | ORAL | 3 refills | Status: DC

## 2018-02-25 NOTE — Progress Notes (Signed)
cc'ed to pcp °

## 2018-02-25 NOTE — Assessment & Plan Note (Signed)
There is a question of possible primary biliary cholangitis given lab work findings.  [facility] feels this may not be the case.  They have request the pathology slides and are investigating other options for his granulomatosis liver disease.  They will pursue work-up for this.  I recommended he complete his urge so that he is paid for but not proceed with refills unless it is deemed necessary and he has insurance coverage at that point.  Follow-up with [facility] based on the recommendations.

## 2018-02-25 NOTE — Assessment & Plan Note (Signed)
The patient has a history of alcohol abuse but he has been sober since September 2017.  He was recently seen by [facility] liver transplant service.  They recommended 6 months of one-on-one outpatient substance abuse/alcohol counseling.  I reached out to a local facility and they only offer intensive outpatient program but they will contact me with local one-on-one alcohol counseling options.  We will help facilitate placement into a program for him to meet criteria for history of alcohol abuse.

## 2018-02-25 NOTE — Assessment & Plan Note (Signed)
History of possible PBC (currently being further investigated by [facility] liver service) and alcoholic hepatitis.  Further information related possible PBC as per above.  His meld score is currently 8 at [facility].  His labs were just completed a few weeks ago.  He is not currently a candidate for transplant given his good liver function.  They recommended he continue diuretics.  They have reinforced high-protein low-salt diet.  I again reinforced a low-salt diet we discussed how to read nutrition labels and the maximum 2000 mg sodium diet daily.  He states he is cut back significantly on his salt intake.  His edema has subsequently significantly improved.  Overall he states he feels good.  [facility] recommended he continue to see [place] for every 26-month visit, labs, imaging.  EGD every 1 to 2 years (last done in 2018 and next due in 2020 given if he is on a nonselective beta-blocker with a pulse rate of 63 in the office today).  We will continue general liver care.  We can update [facility] based on any changes further recommendations and need to see him further.  Additionally, he has been finally accepted for disability and associated Medicare/Medicaid insurance.  His Medicaid is anticipated to start in the next 1 to 2 weeks.  Medicare he states will start in 2020.  This is excellent news to help him further afford medications and, if he does eventually need a liver transplant, will help with costs.  [facility] recommend starting lactulose and Xifaxan.  We will attempt to obtain this with co-pay assistance versus insurance.  No need to update liver labs today, he is due for liver imaging and we will check a right upper quadrant ultrasound.  Will up in 6 months.

## 2018-02-25 NOTE — Telephone Encounter (Signed)
Called # provided and spoke with Webb Silversmith. She stated no need to send referral over. She took patient information/reason for him needing to be seen there. She advised to tell the patient she will contact him about coming in.  Patient is aware. Also aware if he does not hear from Webb Silversmith to give our office a call and let us know. He voiced understanding.

## 2018-02-25 NOTE — Telephone Encounter (Signed)
Based on [facility] liver transplant service requirements, please refer the patient to [facility] for outpatient substance abuse counseling related to history of alcoholism (no alcohol since 2017). [phone]

## 2018-02-25 NOTE — Patient Instructions (Signed)
1. We will help schedule your ultrasound for you. 2. I am trying to find outpatient alcohol abuse counseling options for you. 3. I have sent in a prescription for lactulose to your pharmacy.  As we discussed, take this initially twice a day.  You can increase or decrease the dose with a goal of 2-3 soft bowel movements a day. 4. I am also sending in a prescription for Xifaxan to help prevent confusion as well.  I am giving you a co-pay card to see if this can be authorized before your insurance starts.  If not, we will have to wait for your insurance to start in order to get Xifaxan for you. 5. Return for follow-up in 6 months. 6. Call us if you have any questions or concerns.  At [facility] we value your feedback. You may receive a survey about your visit today. Please share your experience as we strive to create trusting relationships with our patients to provide genuine, compassionate, quality care.  It was great to see you today!  I hope you have a great summer!!

## 2018-02-25 NOTE — Progress Notes (Signed)
Referring Provider: Caren Macadam, MD Primary Care Physician:  Caren Macadam, MD Primary GI:  Dr. Gala Romney  Chief Complaint  Patient presents with  . Cirrhosis    f/u. DOing okay    HPI:   Thomas Pratt is a 57 y.o. male who presents 25-month follow-up.  Patient was last seen in our office 12/05/2017 for cirrhosis and edema.  He was seen in a semi-urgent office visit at his last visit for concern about abdominal swelling and lower extremity edema.  History of cirrhosis presumed EtOH abuse although he has not had any alcohol since September 2017.  He also has positive AMA, ASMA, liver biopsy with granulomatosis hepatitis with differentials including PBC, sarcoidosis, idiopathic granulomatosis, drug-induced liver disease.  The patient was started on Urso due to elevated alkaline phosphatase and positive ANA for presumed PBC.  He was referred to [facility] liver clinic but it was denied as of 11/20/2017.  Status post cholecystectomy in February 2019.  Inguinal hernia repair and December 2018.  EGD/TCS in August 2018 with tubular adenoma, grade 1 esophageal varices, erosive esophagitis, portal gastropathy.  CT of the abdomen and pelvis with contrast in March 2019 with no hepatoma and no acute findings, stable cirrhosis with moderate volume ascites.  He reports feeling worse over the previous several weeks with more fatigue.  Denies shortness of breath.  Some epigastric and right upper quadrant discomfort but is not sure if it was related to meals or movement.  Early satiety for months.  No vomiting.  Does consume hot dogs, bologna, enjoys adding salt to food but is trying to refrain.  He also eats tacos.  Typically has 2 meals a day, breakfast and dinner.  No itching, heartburn, irregular bowel movements.  No melena or hematochezia.  Noted increased bruising.  From April 2018 through May 2019 his weight had increased from 147 pounds to 169 pounds; noted 13 pound weight gain since previous office visit. He  was on a standard diuretic dose of Lasix 40 mg and Spironolactone 100 mg.  Recommended update labs for liver function.  Reinforced low-salt diet, continue diuretics until labs returned to check for any possible needing titration.  Noted continue exploration of liver clinic referral for him and recommended he pursue his Medicaid/disability in case there is a need for liver transplant.  It does not appear he had liver labs drawn as ordered.  Another provider ordered labs on 12/19/2017 which showed a CMP with persistently elevated alkaline phosphatase at 152, mildly elevated AST at 46, normal ALT, normal bilirubin.  CBC with mild anemia with a hemoglobin of 12.1 and he is previously typically been in the mid 13 range.  No leukocytosis.  INR was not drawn.  Unable to calculate meld score without INR.  We received word on 12/26/2017 that [facility] accepted his referral to their liver clinic.  He had an appointment scheduled for July 2019.  He was seen by [facility] transplant hepatology on 02/18/2018.  They recommended continue Corgard 40 mg daily and 20 mg in the afternoon, recommended started lactulose and rifaximin to be ordered by local GI, continue diuretics and Urso recommended no further refills until liver biopsy returned.  Labs drawn by [facility] showed a meld score of 8.  Required 6 months of alcohol/substance abuse counseling prior to evaluation for transplant.  Today he states he is doing ok. He was just approved for disability and Medicare/Medicaid. He had a good experience at [facility]. He's feeling better, pretty good. Some  LE edema, abdominal swelling improved. He is looking around for substance abuse counseling. Query availability of outpatient substance abuse counseling at [employer]. Denies yellowing of skin/eyes, generalized pruritis (other than rare, random felt due to dry skin), darkened urine currently (previously had some darkened urine but UA was normal at PCP), denies acute episodic  confusion, worsening fatigue. Appetite is pretty good. He states he has cut way back on salt and he is reading food labels for sodium content. Was unsure of daily limit (which I confirmed with him). Denies chest pain, dyspnea, dizziness, lightheadedness, syncope, near syncope. Denies any other upper or lower GI symptoms.  Medicare/disability is set to start in 2020. Medicaid is supposed to start sometime in the next 1-2 weeks.  Last EGD was 03/06/2017 (next due 02/2019).  Sub Ab Counseling; Lactulose/Xifaxan  Past Medical History:  Diagnosis Date  . Cirrhosis ([place])    likely ETOH use     Past Surgical History:  Procedure Laterality Date  . CHOLECYSTECTOMY N/A 09/01/2017   Procedure: LAPAROSCOPIC CHOLECYSTECTOMY;  Surgeon: Aviva Signs, MD;  Location: [facility];  Service: General;  Laterality: N/A;  . COLONOSCOPY WITH PROPOFOL N/A 03/06/2017   Dr. Gala Romney: tubular adenoma removed, next TCS 5 years if health permits.   . ESOPHAGOGASTRODUODENOSCOPY (EGD) WITH PROPOFOL N/A 03/06/2017   Dr. Gala Romney: grade 1 esophageal varices, erosive esophagitis, portal gastropathy.   Marland Kitchen HERNIA REPAIR Right    RIH  . INGUINAL HERNIA REPAIR Right 07/11/2017   Procedure: HERNIA REPAIR INGUINAL ADULT WITH MESH;  Surgeon: Aviva Signs, MD;  Location: [facility];  Service: General;  Laterality: Right;  patient knows to arrive at 7:30  . IR TRANSCATHETER BX  10/14/2017  . IR US GUIDE VASC ACCESS RIGHT  10/14/2017  . IR VENOGRAM HEPATIC WO HEMODYNAMIC EVALUATION  10/14/2017  . POLYPECTOMY  03/06/2017   Procedure: POLYPECTOMY;  Surgeon: Daneil Dolin, MD;  Location: [facility];  Service: Endoscopy;;  ascending colon;    Current Outpatient Medications  Medication Sig Dispense Refill  . citalopram (CELEXA) 20 MG tablet Take 1 tablet (20 mg total) by mouth daily. 30 tablet 3  . furosemide (LASIX) 40 MG tablet Take 1 tablet (40 mg total) by mouth daily. (Patient taking differently: Take 40 mg by mouth 2 (two) times daily. ) 30  tablet 5  . nadolol (CORGARD) 40 MG tablet Take 1 tablet (40 mg total) by mouth daily. And 1/2 tablet in evening (Patient taking differently: Take 20-40 mg by mouth 2 (two) times daily. ) 60 tablet 3  . pantoprazole (PROTONIX) 40 MG tablet Take 40 mg by mouth daily.    Marland Kitchen spironolactone (ALDACTONE) 100 MG tablet Take one in the morning and 1/2 in the afternoon (Patient taking differently: Take 100 mg by mouth 2 (two) times daily. ) 45 tablet 1  . ursodiol (ACTIGALL) 500 MG tablet Take 1 tablet (500 mg total) by mouth 2 (two) times daily. 60 tablet 5  . Vitamin D, Ergocalciferol, (DRISDOL) 50000 units CAPS capsule Take 1 capsule (50,000 Units total) by mouth every 7 (seven) days. 12 capsule 0   No current facility-administered medications for this visit.     Allergies as of 02/25/2018  . (No Known Allergies)    Family History  Problem Relation Age of Onset  . COPD Mother   . Liver disease Neg Hx   . Colon cancer Neg Hx   . Colon polyps Neg Hx     Social History   Socioeconomic History  .  Marital status: Divorced    Spouse name: Not on file  . Number of children: Not on file  . Years of education: Not on file  . Highest education level: Not on file  Occupational History  . Occupation: unemployed  Social Needs  . Financial resource strain: Not on file  . Food insecurity:    Worry: Not on file    Inability: Not on file  . Transportation needs:    Medical: Not on file    Non-medical: Not on file  Tobacco Use  . Smoking status: Never Smoker  . Smokeless tobacco: Never Used  Substance and Sexual Activity  . Alcohol use: No    Comment: Quit September 2017, heavy alcohol abuse previously  . Drug use: No  . Sexual activity: Not Currently    Partners: Female  Lifestyle  . Physical activity:    Days per week: Not on file    Minutes per session: Not on file  . Stress: Not on file  Relationships  . Social connections:    Talks on phone: Not on file    Gets together: Not on  file    Attends religious service: Not on file    Active member of club or organization: Not on file    Attends meetings of clubs or organizations: Not on file    Relationship status: Not on file  Other Topics Concern  . Not on file  Social History Narrative   Lives in [place], [place]. 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    Drank alcohol for years, quit 05/07/16. Has been sober since then. Quit b/c of health/cirrhosis.   Divorced for over 15 years.   Dating sporadically.   Has 3 daughters and 1 son live locally.   Originally from [place], moved here b/c of wife.    Eats all foods.    Wear seatbelt.   Does not drive, does not have a license, lost due to DWI.    Brought by neighbor.    Does not go to church. Believes in God. Prays each night, per report.     Review of Systems: General: Negative for anorexia, weight loss, fever, chills, fatigue, weakness. ENT: Negative for hoarseness, difficulty swallowing. CV: Negative for chest pain, angina, palpitations, peripheral edema.  Respiratory: Negative for dyspnea at rest, cough, sputum, wheezing.  GI: See history of present illness. Derm: Negative for rash or itching.  Neuro: Negative for memory loss, confusion.  Endo: Negative for unusual weight change.  Heme: Negative for bruising or bleeding. Allergy: Negative for rash or hives.   Physical Exam: BP 127/73   Pulse 63   Temp (!) 97 F (36.1 C) (Oral)   Ht 5\' 8"  (1.727 m)   Wt 159 lb (72.1 kg)   BMI 24.18 kg/m  General:   Alert and oriented. Pleasant and cooperative. Well-nourished and well-developed.  Eyes:  Without icterus, sclera clear and conjunctiva pink.  Ears:  Normal auditory acuity. Cardiovascular:  S1, S2 present without murmurs appreciated. Extremities without clubbing or edema. Respiratory:  Clear to auscultation bilaterally. No wheezes, rales, or rhonchi. No distress.  Gastrointestinal:  +BS, soft, non-tender and non-distended. No HSM noted. No  guarding or rebound. No masses appreciated.  Rectal:  Deferred  Musculoskalatal:  Symmetrical without gross deformities. Neurologic:  Alert and oriented x4;  grossly normal neurologically. Psych:  Alert and cooperative. Normal mood and affect. Heme/Lymph/Immune: No excessive bruising noted.    02/25/2018 9:17 AM   Disclaimer: This note  was dictated with voice recognition software. Similar sounding words can inadvertently be transcribed and may not be corrected upon review.

## 2018-03-03 ENCOUNTER — Other Ambulatory Visit: Payer: Self-pay

## 2018-03-03 ENCOUNTER — Ambulatory Visit: Payer: Self-pay | Admitting: Family Medicine

## 2018-03-03 ENCOUNTER — Ambulatory Visit (INDEPENDENT_AMBULATORY_CARE_PROVIDER_SITE_OTHER): Payer: Self-pay | Admitting: Family Medicine

## 2018-03-03 ENCOUNTER — Encounter: Payer: Self-pay | Admitting: Family Medicine

## 2018-03-03 VITALS — BP 118/54 | HR 68 | Temp 98.8°F | Resp 12 | Ht 68.0 in | Wt 161.0 lb

## 2018-03-03 DIAGNOSIS — Z23 Encounter for immunization: Secondary | ICD-10-CM

## 2018-03-03 DIAGNOSIS — F320 Major depressive disorder, single episode, mild: Secondary | ICD-10-CM

## 2018-03-03 DIAGNOSIS — E1169 Type 2 diabetes mellitus with other specified complication: Secondary | ICD-10-CM

## 2018-03-03 NOTE — Progress Notes (Signed)
Patient ID: Thomas Pratt, male    DOB: 1961/05/25, 57 y.o.   MRN: [id number]  Chief Complaint  Patient presents with  . Results    discuss results of lab work    Allergies Patient has no known allergies.  Subjective:   Thomas Pratt is a 57 y.o. male who presents to [facility] today.  HPI Thomas Pratt presents today to follow-up for his mood.  He reports he started the Celexa as directed.  He feels like the medication is helping.  He does feel like a weight is lifted off of him.  He denies any side effects with the medication.  He denies any suicidal or homicidal relations.  He reports that he has been approved for Medicaid.  He reports that he is still feeling somewhat down but overall his mood is improved.  He denies any auditory or visual hallucinations.  No side effects with medication.  Has been to [facility] liver clinic.  Does feel hopeful that he is going to get some of these liver issues sorted out.  Appetite is good.  Taking all of his medications.  Denies any chest pain or shortness of breath.  Denies any anxiety.  Has not had any dysuria or burning with urination.  Feels that his energy is better and is able to focus on positive things better.  Sleeping is slightly improved.  Does not wish to get his test for diabetes today.  Is making improvements in his diet and is motivated to make changes.  Is eating less junk food and carbs.  He reports his daughter is helping him with his diet.  He does not wish to see the nutritionist.  Denies any tobacco, alcohol or drug use.  Has not gotten his eye exam done.  No lesions on his feet.  Depression         This is a new problem.  The current episode started more than 1 month ago.   The onset quality is gradual.   The problem occurs intermittently.  The problem has been gradually improving since onset.  Associated symptoms include decreased concentration, fatigue and decreased interest.  Associated symptoms include no helplessness, no  hopelessness, does not have insomnia, not irritable, no restlessness, no appetite change, no myalgias, no headaches, no indigestion, not sad and no suicidal ideas.     The symptoms are aggravated by family issues and social issues.  Past treatments include SSRIs - Selective serotonin reuptake inhibitors.  Compliance with treatment is good.  Previous treatment provided moderate relief.  Past medical history includes chronic illness and recent illness.     Pertinent negatives include no suicide attempts.   Past Medical History:  Diagnosis Date  . Cirrhosis ([place])    likely ETOH use     Past Surgical History:  Procedure Laterality Date  . CHOLECYSTECTOMY N/A 09/01/2017   Procedure: LAPAROSCOPIC CHOLECYSTECTOMY;  Surgeon: Aviva Signs, MD;  Location: [facility];  Service: General;  Laterality: N/A;  . COLONOSCOPY WITH PROPOFOL N/A 03/06/2017   Dr. Gala Romney: tubular adenoma removed, next TCS 5 years if health permits.   . ESOPHAGOGASTRODUODENOSCOPY (EGD) WITH PROPOFOL N/A 03/06/2017   Dr. Gala Romney: grade 1 esophageal varices, erosive esophagitis, portal gastropathy.   Marland Kitchen HERNIA REPAIR Right    RIH  . INGUINAL HERNIA REPAIR Right 07/11/2017   Procedure: HERNIA REPAIR INGUINAL ADULT WITH MESH;  Surgeon: Aviva Signs, MD;  Location: [facility];  Service: General;  Laterality: Right;  patient knows to arrive at 7:30  .  IR TRANSCATHETER BX  10/14/2017  . IR US GUIDE VASC ACCESS RIGHT  10/14/2017  . IR VENOGRAM HEPATIC WO HEMODYNAMIC EVALUATION  10/14/2017  . POLYPECTOMY  03/06/2017   Procedure: POLYPECTOMY;  Surgeon: Daneil Dolin, MD;  Location: [facility];  Service: Endoscopy;;  ascending colon;    Family History  Problem Relation Age of Onset  . COPD Mother   . Liver disease Neg Hx   . Colon cancer Neg Hx   . Colon polyps Neg Hx      Social History   Socioeconomic History  . Marital status: Divorced    Spouse name: Not on file  . Number of children: Not on file  . Years of education: Not on file    . Highest education level: Not on file  Occupational History  . Occupation: unemployed  Social Needs  . Financial resource strain: Not on file  . Food insecurity:    Worry: Not on file    Inability: Not on file  . Transportation needs:    Medical: Not on file    Non-medical: Not on file  Tobacco Use  . Smoking status: Never Smoker  . Smokeless tobacco: Never Used  Substance and Sexual Activity  . Alcohol use: No    Comment: Quit September 2017, heavy alcohol abuse previously  . Drug use: No  . Sexual activity: Not Currently    Partners: Female  Lifestyle  . Physical activity:    Days per week: Not on file    Minutes per session: Not on file  . Stress: Not on file  Relationships  . Social connections:    Talks on phone: Not on file    Gets together: Not on file    Attends religious service: Not on file    Active member of club or organization: Not on file    Attends meetings of clubs or organizations: Not on file    Relationship status: Not on file  Other Topics Concern  . 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    Drank alcohol for years, quit 05/07/16. Has been sober since then. Quit b/c of health/cirrhosis.   Divorced for over 15 years.   Dating sporadically.   Has 3 daughters and 1 son live locally.   Originally from [place], moved here b/c of wife.    Eats all foods.    Wear seatbelt.   Does not drive, does not have a license, lost due to DWI.    Brought by neighbor.    Does not go to church. Believes in God. Prays each night, per report.    Current Outpatient Medications on File Prior to Visit  Medication Sig Dispense Refill  . citalopram (CELEXA) 20 MG tablet Take 1 tablet (20 mg total) by mouth daily. 30 tablet 3  . furosemide (LASIX) 40 MG tablet Take 1 tablet (40 mg total) by mouth daily. (Patient taking differently: Take 40 mg by mouth 2 (two) times daily. ) 30 tablet 5  .  lactulose, encephalopathy, (CHRONULAC) 10 GM/15ML SOLN Take 15 mLs (10 g total) by mouth 2 (two) times daily as needed. Adjust dosing for 2-3 soft stools a day 2700 mL 2  . nadolol (CORGARD) 40 MG tablet Take 1 tablet (40 mg total) by mouth daily. And 1/2 tablet in evening (Patient taking differently: Take 20-40 mg by mouth 2 (two) times daily. ) 60 tablet 3  .  pantoprazole (PROTONIX) 40 MG tablet Take 40 mg by mouth daily.    . rifaximin (XIFAXAN) 550 MG TABS tablet Take 1 tablet (550 mg total) by mouth 2 (two) times daily. 60 tablet 3  . spironolactone (ALDACTONE) 100 MG tablet Take one in the morning and 1/2 in the afternoon (Patient taking differently: Take 100 mg by mouth 2 (two) times daily. ) 45 tablet 1  . ursodiol (ACTIGALL) 500 MG tablet Take 1 tablet (500 mg total) by mouth 2 (two) times daily. 60 tablet 5  . Vitamin D, Ergocalciferol, (DRISDOL) 50000 units CAPS capsule Take 1 capsule (50,000 Units total) by mouth every 7 (seven) days. 12 capsule 0   No current facility-administered medications on file prior to visit.      Review of Systems  Constitutional: Positive for fatigue. Negative for appetite change, chills, fever and unexpected weight change.  HENT: Negative for trouble swallowing and voice change.   Eyes: Negative for visual disturbance.  Respiratory: Negative for cough, chest tightness, shortness of breath and wheezing.   Cardiovascular: Negative for chest pain, palpitations and leg swelling.  Gastrointestinal: Negative for abdominal pain, diarrhea, nausea and vomiting.  Genitourinary: Negative for decreased urine volume, dysuria and frequency.  Musculoskeletal: Negative for myalgias.  Skin: Negative for rash.  Neurological: Negative for dizziness, tremors, syncope, facial asymmetry, weakness and headaches.  Hematological: Negative for adenopathy. Does not bruise/bleed easily.  Psychiatric/Behavioral: Positive for decreased concentration and depression. Negative for  suicidal ideas. The patient does not have insomnia.      Objective:   BP (!) 118/54 (BP Location: Right Arm, Patient Position: Sitting, Cuff Size: Normal)   Pulse 68   Temp 98.8 F (37.1 C) (Temporal)   Resp 12   Ht 5\' 8"  (1.727 m)   Wt 161 lb 0.6 oz (73 kg)   SpO2 98% Comment: room air  BMI 24.49 kg/m   Physical Exam  Constitutional: He is oriented to person, place, and time. He appears well-developed and well-nourished. He is not irritable.  HENT:  Head: Normocephalic and atraumatic.  Eyes: Pupils are equal, round, and reactive to light. EOM are normal.  Neck: Normal range of motion. Neck supple. No thyromegaly present.  Cardiovascular: Normal rate, regular rhythm and normal heart sounds.  Pulmonary/Chest: Effort normal and breath sounds normal.  Musculoskeletal: He exhibits no edema.  Neurological: He is alert and oriented to person, place, and time. No cranial nerve deficit.  Skin: Skin is warm, dry and intact.  Psychiatric: His speech is normal and behavior is normal. Judgment and thought content normal. Cognition and memory are normal. He expresses no homicidal and no suicidal ideation. He expresses no suicidal plans and no homicidal plans.  Mood slightly dysthymic/down.  However patient does express hope and smiles during visit.  Denies any suicidal or homicidal ideation.  Denies any auditory or visual hallucinations.  Thought process is goal-directed without delusions, phobias, obsessions, or compulsions.  Vitals reviewed.   Depression screen [facility] 2/9 03/03/2018 02/12/2018 10/30/2017 09/26/2017 09/26/2017  Decreased Interest 2 3 0 2 0  Down, Depressed, Hopeless 1 1 0 1 1  PHQ - 2 Score 3 4 0 3 1  Altered sleeping 1 2 - 1 -  Tired, decreased energy 1 3 - 2 -  Change in appetite 0 2 - 0 -  Feeling bad or failure about yourself  0 0 - 0 -  Trouble concentrating 0 2 - - -  Moving slowly or fidgety/restless 1 2 - 1 -  Suicidal thoughts 1 0 - 0 -  PHQ-9 Score 7 15 - 7 -    Difficult doing work/chores Not difficult at all Very difficult - - -    Assessment and Plan  1. Depression, major, single episode, mild (HCC) Continue Celexa 20 mg p.o. Daily. Suicide risks evaluated and documented in note if present or in the area below.  Patient does not have/denies the following risks: previous suicide attempts, family history of suicide, access to lethal means, prior history of psychiatric disorder, history of alcohol or substance abuse disorder, recent loss of a loved one, or severe hopelessness. Patient denies access to firearms or if present will have removed from home/access.   Patient has protective factors of family and community support.  Patient reports that family believes is behaving rationally. Patient displays problem solving skills.   Patient specifically denies suicide ideation. Patient has access/information to healthcare contacts if situation or mood changes where patient is a risk to self or others or mood becomes unstable.   During the encounter, the patient had good eye contact and firm handshake regarding safety contract and agreement to seek help if mood worsens and not to harm self.   Patient understands the treatment plan and is in agreement. Agrees to keep follow up and call prior or return to clinic if needed.    2. Type 2 diabetes mellitus with other specified complication, without long-term current use of insulin (Thomas Pratt) Patient wishes to get his labs done at next visit.  He defers nutritionist.  He was given information regarding his eye exam again today.  He denies any lesions on his feet.  Foot care discussed.  Diet, exercise, and weight discussed today. - Hemoglobin A1c - Pneumococcal polysaccharide vaccine 23-valent greater than or equal to 2yo subcutaneous/IM - Microalbumin / creatinine urine ratio  Return in about 4 weeks (around 03/31/2018) for follow up. Caren Macadam, MD 03/03/2018

## 2018-03-03 NOTE — Patient Instructions (Signed)
Please give information on eye doctor referral.   Follow up in one month. Continue all  Medications.

## 2018-03-04 ENCOUNTER — Ambulatory Visit: Payer: Self-pay | Admitting: Family Medicine

## 2018-03-04 ENCOUNTER — Encounter: Payer: Self-pay | Admitting: Family Medicine

## 2018-03-04 ENCOUNTER — Ambulatory Visit (HOSPITAL_COMMUNITY)
Admission: RE | Admit: 2018-03-04 | Discharge: 2018-03-04 | Disposition: A | Discharge status: Home / Self Care | Payer: Self-pay | Source: Ambulatory Visit | Attending: Nurse Practitioner | Admitting: Nurse Practitioner

## 2018-03-04 DIAGNOSIS — F1011 Alcohol abuse, in remission: Secondary | ICD-10-CM

## 2018-03-04 DIAGNOSIS — R188 Other ascites: Secondary | ICD-10-CM | POA: Insufficient documentation

## 2018-03-04 DIAGNOSIS — Z87898 Personal history of other specified conditions: Secondary | ICD-10-CM | POA: Insufficient documentation

## 2018-03-04 DIAGNOSIS — K7460 Unspecified cirrhosis of liver: Secondary | ICD-10-CM | POA: Insufficient documentation

## 2018-03-04 DIAGNOSIS — K743 Primary biliary cirrhosis: Secondary | ICD-10-CM | POA: Insufficient documentation

## 2018-03-04 LAB — MICROALBUMIN / CREATININE URINE RATIO
CREATININE, URINE: 127 mg/dL (ref 20–320)
Microalb Creat Ratio: 2 mcg/mg creat (ref <30)
Microalb, Ur: 0.2 mg/dL

## 2018-03-04 LAB — HEMOGLOBIN A1C
Hgb A1c MFr Bld: 5.9 % of total Hgb — ABNORMAL HIGH (ref <5.7)
Mean Plasma Glucose: 123 (calc)
eAG (mmol/L): 6.8 (calc)

## 2018-03-17 ENCOUNTER — Ambulatory Visit: Payer: Self-pay | Admitting: Family Medicine

## 2018-04-03 ENCOUNTER — Other Ambulatory Visit: Payer: Self-pay

## 2018-04-03 ENCOUNTER — Encounter: Payer: Self-pay | Admitting: Family Medicine

## 2018-04-03 ENCOUNTER — Ambulatory Visit (INDEPENDENT_AMBULATORY_CARE_PROVIDER_SITE_OTHER): Payer: Self-pay | Admitting: Family Medicine

## 2018-04-03 VITALS — BP 104/58 | HR 74 | Temp 98.9°F | Resp 12 | Ht 68.0 in | Wt 169.0 lb

## 2018-04-03 DIAGNOSIS — D8689 Sarcoidosis of other sites: Secondary | ICD-10-CM

## 2018-04-03 DIAGNOSIS — R188 Other ascites: Secondary | ICD-10-CM

## 2018-04-03 DIAGNOSIS — Z23 Encounter for immunization: Secondary | ICD-10-CM

## 2018-04-03 DIAGNOSIS — R42 Dizziness and giddiness: Secondary | ICD-10-CM

## 2018-04-03 DIAGNOSIS — H539 Unspecified visual disturbance: Secondary | ICD-10-CM

## 2018-04-03 DIAGNOSIS — K7460 Unspecified cirrhosis of liver: Secondary | ICD-10-CM

## 2018-04-03 MED ORDER — SPIRONOLACTONE 50 MG PO TABS: ORAL_TABLET | ORAL | 0 refills | Status: DC

## 2018-04-03 NOTE — Progress Notes (Signed)
Patient ID: Thomas Pratt, male    DOB: 08-Nov-1960, 57 y.o.   MRN: [id number]  Chief Complaint  Patient presents with  . Headache  . Blurred Vision    Allergies Patient has no known allergies.  Subjective:   Thomas Pratt is a 56 y.o. male who presents to [facility] today.  HPI Here for visit b/c eyes feel heavy and have had mild headache since Monday,5 days. Has had vision blurry on and off for quite some time.  Vision is ok but he feels like he has had to focus on seeing things, more than he should.  Feels like head is congested. Reports that vision can be intermittently blurry, but lately has only occured when he stands up from sitting or laying down. Not had any others issues with vision. Has felt like he has needed glasses for a long time and had bought some glasses at the store, but has not had vision check. No acute vision changes.  Feels like his feet are more swollen but reports that he had some tests done and told that the swelling is due to his liver.   Headache, around the eyes and behind the eyes. Has lasted for 5 days, not worsened, no pain but feels like a heaviness behind his eyes. Has some pain in the frontal part of his sinus area between/above his eyes. No neck pain or stiffness. No light sensitivity.  No associated nausea or vomiting. No fevers. No drainage from sinuses.   Last night he reports that he felt his belly was a little hard but then when he woke up this morning it was ok.  Has been taking all the diruetics but believes that he has been taking spironolactone 100 mg twice a day for several weeks.     Past Medical History:  Diagnosis Date  . Cirrhosis ([place])    likely ETOH use     Past Surgical History:  Procedure Laterality Date  . CHOLECYSTECTOMY N/A 09/01/2017   Procedure: LAPAROSCOPIC CHOLECYSTECTOMY;  Surgeon: Aviva Signs, MD;  Location: [facility] ORS;  Service: General;  Laterality: N/A;  . COLONOSCOPY WITH PROPOFOL N/A  03/06/2017   Dr. Gala Romney: tubular adenoma removed, next TCS 5 years if health permits.   . ESOPHAGOGASTRODUODENOSCOPY (EGD) WITH PROPOFOL N/A 03/06/2017   Dr. Gala Romney: grade 1 esophageal varices, erosive esophagitis, portal gastropathy.   Marland Kitchen HERNIA REPAIR Right    RIH  . INGUINAL HERNIA REPAIR Right 07/11/2017   Procedure: HERNIA REPAIR INGUINAL ADULT WITH MESH;  Surgeon: Aviva Signs, MD;  Location: [facility] ORS;  Service: General;  Laterality: Right;  patient knows to arrive at 7:30  . IR TRANSCATHETER BX  10/14/2017  . IR US GUIDE VASC ACCESS RIGHT  10/14/2017  . IR VENOGRAM HEPATIC WO HEMODYNAMIC EVALUATION  10/14/2017  . POLYPECTOMY  03/06/2017   Procedure: POLYPECTOMY;  Surgeon: Daneil Dolin, MD;  Location: [facility] ENDO SUITE;  Service: Endoscopy;;  ascending colon;    Family History  Problem Relation Age of Onset  . COPD Mother   . Liver disease Neg Hx   . Colon cancer Neg Hx   . Colon polyps Neg Hx      Social History   Socioeconomic History  . Marital status: Divorced    Spouse name: Not on file  . Number of children: Not on file  . Years of education: Not on file  . Highest education level: Not on file  Occupational History  . Occupation: unemployed  Social Needs  .  Financial resource strain: Not on file  . Food insecurity:    Worry: Not on file    Inability: Not on file  . Transportation needs:    Medical: Not on file    Non-medical: Not on file  Tobacco Use  . Smoking status: Never Smoker  . Smokeless tobacco: Never Used  Substance and Sexual Activity  . Alcohol use: No    Comment: Quit September 2017, heavy alcohol abuse previously  . Drug use: No  . Sexual activity: Not Currently    Partners: Female  Lifestyle  . Physical activity:    Days per week: Not on file    Minutes per session: Not on file  . Stress: Not on file  Relationships  . Social connections:    Talks on phone: Not on file    Gets together: Not on file    Attends religious service: Not on file     Active member of club or organization: Not on file    Attends meetings of clubs or organizations: Not on file    Relationship status: Not on file  Other Topics Concern  . Not on file  Social History Narrative   Lives in [place], [place]. 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    Drank alcohol for years, quit 05/07/16. Has been sober since then. Quit b/c of health/cirrhosis.   Divorced for over 15 years.   Dating sporadically.   Has 3 daughters and 1 son live locally.   Originally from [place], moved here b/c of wife.    Eats all foods.    Wear seatbelt.   Does not drive, does not have a license, lost due to DWI.    Brought by neighbor.    Does not go to church. Believes in God. Prays each night, per report.     Review of Systems  Constitutional: Positive for fatigue. Negative for activity change, appetite change, chills, diaphoresis, fever and unexpected weight change.  HENT: Negative for congestion, dental problem, ear discharge, ear pain, facial swelling, mouth sores, nosebleeds, postnasal drip, rhinorrhea, sinus pressure, sinus pain, sneezing, sore throat, tinnitus, trouble swallowing and voice change.   Eyes: Positive for visual disturbance.       Vision is normal at this time.   Respiratory: Negative for apnea, choking, chest tightness, shortness of breath and wheezing.   Cardiovascular: Negative for chest pain, palpitations and leg swelling.  Gastrointestinal: Negative for abdominal pain and vomiting.  Endocrine: Negative for polyphagia and polyuria.  Genitourinary: Negative for dysuria, flank pain, frequency, hematuria and urgency.  Musculoskeletal: Negative for arthralgias, back pain, gait problem, neck pain and neck stiffness.  Skin: Negative for rash.  Neurological: Positive for headaches.  Hematological: Negative for adenopathy. Does not bruise/bleed easily.  Psychiatric/Behavioral: Negative for dysphoric mood and hallucinations. The patient is not nervous/anxious.     Current Outpatient Medications on File Prior to Visit  Medication Sig Dispense Refill  . citalopram (CELEXA) 20 MG tablet Take 1 tablet (20 mg total) by mouth daily. 30 tablet 3  . furosemide (LASIX) 40 MG tablet Take 1 tablet (40 mg total) by mouth daily. (Patient taking differently: Take 40 mg by mouth 2 (two) times daily. ) 30 tablet 5  . lactulose, encephalopathy, (CHRONULAC) 10 GM/15ML SOLN Take 15 mLs (10 g total) by mouth 2 (two) times daily as needed. Adjust dosing for 2-3 soft stools a day 2700 mL 2  . nadolol (CORGARD) 40 MG tablet Take 1 tablet (40 mg total)  by mouth daily. And 1/2 tablet in evening (Patient taking differently: Take 20-40 mg by mouth 2 (two) times daily. ) 60 tablet 3  . pantoprazole (PROTONIX) 40 MG tablet Take 40 mg by mouth daily.    . rifaximin (XIFAXAN) 550 MG TABS tablet Take 1 tablet (550 mg total) by mouth 2 (two) times daily. 60 tablet 3  . spironolactone (ALDACTONE) 100 MG tablet Take one in the morning and 1/2 in the afternoon (Patient taking differently: Take 100 mg by mouth 2 (two) times daily. ) 45 tablet 1  . ursodiol (ACTIGALL) 500 MG tablet Take 1 tablet (500 mg total) by mouth 2 (two) times daily. 60 tablet 5  . Vitamin D, Ergocalciferol, (DRISDOL) 50000 units CAPS capsule Take 1 capsule (50,000 Units total) by mouth every 7 (seven) days. 12 capsule 0   No current facility-administered medications on file prior to visit.     Objective:   BP (!) 104/58 (BP Location: Right Arm, Patient Position: Sitting, Cuff Size: Normal)   Pulse 74   Temp 98.9 F (37.2 C) (Temporal)   Resp 12   Ht 5\' 8"  (1.727 m)   Wt 169 lb 0.6 oz (76.7 kg)   SpO2 97% Comment: room air  BMI 25.70 kg/m   Physical Exam  Constitutional: He is oriented to person, place, and time. He appears well-developed and well-nourished.  HENT:  Head: Normocephalic and atraumatic.  Mouth/Throat: Oropharynx is clear and moist.  Eyes: EOM are normal. No scleral icterus. Right eye  exhibits normal extraocular motion and no nystagmus. Left eye exhibits normal extraocular motion and no nystagmus. Pupils are equal.  Neck: Normal range of motion. Neck supple. No JVD present. No neck rigidity. No tracheal deviation present.  Cardiovascular: Normal rate, regular rhythm and normal heart sounds.  Pulmonary/Chest: Effort normal and breath sounds normal. No respiratory distress.  Abdominal: Soft. Bowel sounds are normal.  Lymphadenopathy:    He has no cervical adenopathy.  Neurological: He is alert and oriented to person, place, and time. He has normal strength. He is not disoriented. He displays normal reflexes. No cranial nerve deficit or sensory deficit.  Skin: Skin is warm and dry. No rash noted.  Psychiatric: He has a normal mood and affect. His behavior is normal. His mood appears not anxious. He is not agitated.   Depression screen [facility] 2/9 04/03/2018 03/03/2018 02/12/2018 10/30/2017 09/26/2017  Decreased Interest 0 2 3 0 2  Down, Depressed, Hopeless 2 1 1  0 1  PHQ - 2 Score 2 3 4  0 3  Altered sleeping 3 1 2  - 1  Tired, decreased energy 0 1 3 - 2  Change in appetite 0 0 2 - 0  Feeling bad or failure about yourself  0 0 0 - 0  Trouble concentrating 0 0 2 - -  Moving slowly or fidgety/restless 0 1 2 - 1  Suicidal thoughts 0 1 0 - 0  PHQ-9 Score 5 7 15  - 7  Difficult doing work/chores - Not difficult at all Very difficult - -   Orthostatics performed.  Vision checked.   Assessment and Plan  1. Orthostatic lightheadedness Suspect patient is having some orthostasis secondary to his increase in his diuretic.  Will decrease spironolactone to 50 mg twice a day.  He will follow-up with new PCP in 2 weeks or sooner if needed.  He was counseled concerning worrisome signs and symptoms of orthostatics were of dizzy/lightheadedness.  He was told if those develop he should go to  the emergency department or seek care.  Continued good hydration state discussed 2. Need for immunization  against influenza  - Flu Vaccine QUAD 36+ mos IM  3. Cirrhosis of liver with ascites, unspecified hepatic cirrhosis type ([place]) Decrease dose of Spironolactone at this time.  Recommend follow-up with [facility] liver clinic and gastroenterology.  Did discuss his diagnosis of liver sarcoidosis. - spironolactone (ALDACTONE) 50 MG tablet; Take 1 tablet po bid  Dispense: 60 tablet; Refill: 0  4. Vision disturbance Suspect chronic.  Suspect patient's vision issues have been going on for quite some time.  He is not symptomatic or have any issues at this time.  He was counseled concerning worrisome signs and symptoms of vision issues which could be related to neurologic impairment.  He voiced understanding - Ambulatory referral to Ophthalmology  5. Hepatic granuloma associated with sarcoidosis Keep scheduled visit with GI. No follow-ups on file.  Patient will follow-up with new PCP office in 2 weeks or sooner if needed.  He will need laboratory testing done at that time to check his potassium level.  He was told to call our office with any questions or concerns. Tod Persia, [place] 04/03/2018

## 2018-04-03 NOTE — Patient Instructions (Addendum)
You spironolactone has been decreased to 50 mg twice a day You were on 100 mg twice a day.  You need to follow up in 2-3 weeks with you new PCP You need to get your eyes checked-we have done a referral to an eye doctor.   Hepatic sarcoidosis

## 2018-04-06 LAB — HM DIABETES EYE EXAM

## 2018-04-16 ENCOUNTER — Telehealth: Payer: Self-pay | Admitting: Internal Medicine

## 2018-04-16 NOTE — Telephone Encounter (Signed)
Pt called with c/o nausea, stomach ache, headache and light nose bleed which started this morning. Pt said he never takes the flu shot and got it almost 2 weeks ago and feels that can be causing his symptoms. When asked if he called the office that gave him the flu shot, pt said Dr. Lynelle Doctor is no longer in the office and he didn't know what to do. Pt states the doctor moved to [place]. Pt hasn't taken anything for nausea.

## 2018-04-16 NOTE — Telephone Encounter (Signed)
Pt wants to speak with nurse. Please call him at [phone]

## 2018-04-17 ENCOUNTER — Emergency Department (HOSPITAL_COMMUNITY)
Admission: EM | Admit: 2018-04-17 | Discharge: 2018-04-17 | Disposition: A | Discharge status: Home / Self Care | Payer: Self-pay | Attending: Emergency Medicine | Admitting: Emergency Medicine

## 2018-04-17 ENCOUNTER — Emergency Department (HOSPITAL_COMMUNITY): Payer: Self-pay

## 2018-04-17 ENCOUNTER — Encounter (HOSPITAL_COMMUNITY): Payer: Self-pay | Admitting: Emergency Medicine

## 2018-04-17 ENCOUNTER — Other Ambulatory Visit: Payer: Self-pay

## 2018-04-17 DIAGNOSIS — K7460 Unspecified cirrhosis of liver: Secondary | ICD-10-CM | POA: Insufficient documentation

## 2018-04-17 DIAGNOSIS — R51 Headache: Secondary | ICD-10-CM | POA: Insufficient documentation

## 2018-04-17 DIAGNOSIS — R110 Nausea: Secondary | ICD-10-CM | POA: Insufficient documentation

## 2018-04-17 DIAGNOSIS — R519 Headache, unspecified: Secondary | ICD-10-CM

## 2018-04-17 LAB — COMPREHENSIVE METABOLIC PANEL
ALT: 25 U/L (ref 0–44)
AST: 33 U/L (ref 15–41)
Albumin: 3.8 g/dL (ref 3.5–5.0)
Alkaline Phosphatase: 111 U/L (ref 38–126)
Anion gap: 6 (ref 5–15)
BUN: 13 mg/dL (ref 6–20)
CHLORIDE: 101 mmol/L (ref 98–111)
CO2: 25 mmol/L (ref 22–32)
Calcium: 10 mg/dL (ref 8.9–10.3)
Creatinine, Ser: 1.01 mg/dL (ref 0.61–1.24)
Glucose, Bld: 178 mg/dL — ABNORMAL HIGH (ref 70–99)
POTASSIUM: 4.5 mmol/L (ref 3.5–5.1)
Sodium: 132 mmol/L — ABNORMAL LOW (ref 135–145)
Total Bilirubin: 1 mg/dL (ref 0.3–1.2)
Total Protein: 9.3 g/dL — ABNORMAL HIGH (ref 6.5–8.1)

## 2018-04-17 LAB — CBC WITH DIFFERENTIAL/PLATELET
BASOS ABS: 0 10*3/uL (ref 0.0–0.1)
BASOS PCT: 0 %
EOS ABS: 0.1 10*3/uL (ref 0.0–0.7)
EOS PCT: 1 %
HCT: 39.3 % (ref 39.0–52.0)
Hemoglobin: 13.4 g/dL (ref 13.0–17.0)
LYMPHS ABS: 2.3 10*3/uL (ref 0.7–4.0)
LYMPHS PCT: 30 %
MCH: 31.7 pg (ref 26.0–34.0)
MCHC: 34.1 g/dL (ref 30.0–36.0)
MCV: 92.9 fL (ref 78.0–100.0)
MONO ABS: 0.6 10*3/uL (ref 0.1–1.0)
Monocytes Relative: 8 %
NEUTROS ABS: 4.6 10*3/uL (ref 1.7–7.7)
NEUTROS PCT: 61 %
PLATELETS: 229 10*3/uL (ref 150–400)
RBC: 4.23 MIL/uL (ref 4.22–5.81)
RDW: 14 % (ref 11.5–15.5)
WBC: 7.6 10*3/uL (ref 4.0–10.5)

## 2018-04-17 LAB — AMMONIA: Ammonia: 33 umol/L (ref 9–35)

## 2018-04-17 LAB — URINALYSIS, ROUTINE W REFLEX MICROSCOPIC
Bilirubin Urine: NEGATIVE
Glucose, UA: NEGATIVE mg/dL
Hgb urine dipstick: NEGATIVE
Ketones, ur: NEGATIVE mg/dL
LEUKOCYTES UA: NEGATIVE
NITRITE: NEGATIVE
PH: 6 (ref 5.0–8.0)
Protein, ur: NEGATIVE mg/dL
SPECIFIC GRAVITY, URINE: 1.009 (ref 1.005–1.030)

## 2018-04-17 LAB — TROPONIN I: Troponin I: <0.03 ng/mL (ref <0.03)

## 2018-04-17 LAB — PROTIME-INR
INR: 1.08
PROTHROMBIN TIME: 13.9 s (ref 11.4–15.2)

## 2018-04-17 LAB — LIPASE, BLOOD: Lipase: 45 U/L (ref 11–51)

## 2018-04-17 MED ORDER — IOPAMIDOL (ISOVUE-300) INJECTION 61%
100.0000 mL | Freq: Once | INTRAVENOUS | Status: AC | PRN
  Administered 2018-04-17: 100 mL via INTRAVENOUS

## 2018-04-17 MED ORDER — ONDANSETRON HCL 4 MG/2ML IJ SOLN
4.0000 mg | Freq: Once | INTRAMUSCULAR | Status: AC
  Administered 2018-04-17: 4 mg via INTRAVENOUS
  Filled 2018-04-17: qty 2

## 2018-04-17 MED ORDER — SODIUM CHLORIDE 0.9 % IV BOLUS
500.0000 mL | Freq: Once | INTRAVENOUS | Status: AC
  Administered 2018-04-17: 500 mL via INTRAVENOUS

## 2018-04-17 MED ORDER — ONDANSETRON 4 MG PO TBDP: 4.0000 mg | ORAL_TABLET | Freq: Three times a day (TID) | ORAL | 0 refills | Status: DC | PRN

## 2018-04-17 NOTE — ED Provider Notes (Signed)
[facility] EMERGENCY DEPARTMENT Provider Note   CSN: [id number] Arrival date & time: 04/17/18  1328     History   Chief Complaint Chief Complaint  Patient presents with  . Headache  . Weakness    HPI Thomas Pratt is a 57 y.o. male.  Pt presents to the ED today with headache, weakness, and abdominal pain.  Sx have been going on for weeks.  Pt said he gets nauseous with eating, but he has been able to eat.  He's had a headache for several weeks.  He saw his pcp who sent him to the eye doctor.  Per pt, eye doctor said his vision was ok.  The pt has a hx of cirrhosis and has been on diuretics.  His pcp decreased his diuretic dose b/c he's been orthostatic.  The pt said he's had nosebleeds out of his left nostril for 4 days.  The pt denies vomiting or diarrhea.     Past Medical History:  Diagnosis Date  . Cirrhosis ([place])    likely ETOH use     Patient Active Problem List   Diagnosis Date Noted  . History of ETOH abuse 02/25/2018  . Edema 12/05/2017  . Abdominal pain 11/05/2017  . Primary biliary cholangitis ([place]) 11/05/2017  . Type 2 diabetes mellitus with other specified complication ([place]) 02/72/5366  . Abnormal LFTs 09/16/2017  . Calculus of gallbladder without cholecystitis without obstruction   . Dyspepsia 07/31/2017  . Inguinal hernia 11/14/2016  . Malnutrition of moderate degree 05/22/2016  . Lower extremity edema   . Cirrhosis ([place]) 05/21/2016  . Hypoalbuminemia 05/21/2016  . Hyponatremia 05/21/2016  . Hypokalemia 05/21/2016  . HTN (hypertension) 05/21/2016  . Ascites 05/21/2016  . ALC (alcoholic liver cirrhosis) ([place]) 05/21/2016    Past Surgical History:  Procedure Laterality Date  . CHOLECYSTECTOMY N/A 09/01/2017   Procedure: LAPAROSCOPIC CHOLECYSTECTOMY;  Surgeon: Aviva Signs, MD;  Location: [facility] ORS;  Service: General;  Laterality: N/A;  . COLONOSCOPY WITH PROPOFOL N/A 03/06/2017   Dr. Gala Romney: tubular adenoma removed, next TCS 5 years if health  permits.   . ESOPHAGOGASTRODUODENOSCOPY (EGD) WITH PROPOFOL N/A 03/06/2017   Dr. Gala Romney: grade 1 esophageal varices, erosive esophagitis, portal gastropathy.   Marland Kitchen HERNIA REPAIR Right    RIH  . INGUINAL HERNIA REPAIR Right 07/11/2017   Procedure: HERNIA REPAIR INGUINAL ADULT WITH MESH;  Surgeon: Aviva Signs, MD;  Location: [facility] ORS;  Service: General;  Laterality: Right;  patient knows to arrive at 7:30  . IR TRANSCATHETER BX  10/14/2017  . IR US GUIDE VASC ACCESS RIGHT  10/14/2017  . IR VENOGRAM HEPATIC WO HEMODYNAMIC EVALUATION  10/14/2017  . POLYPECTOMY  03/06/2017   Procedure: POLYPECTOMY;  Surgeon: Daneil Dolin, MD;  Location: [facility] ENDO SUITE;  Service: Endoscopy;;  ascending colon;        Home Medications    Prior to Admission medications   Medication Sig Start Date End Date Taking? Authorizing Provider  citalopram (CELEXA) 20 MG tablet Take 1 tablet (20 mg total) by mouth daily. 02/12/18   Caren Macadam, MD  furosemide (LASIX) 40 MG tablet Take 1 tablet (40 mg total) by mouth daily. Patient taking differently: Take 40 mg by mouth 2 (two) times daily.  08/22/17   Mahala Menghini, PA-C  lactulose, encephalopathy, (CHRONULAC) 10 GM/15ML SOLN Take 15 mLs (10 g total) by mouth 2 (two) times daily as needed. Adjust dosing for 2-3 soft stools a day 02/25/18   Carlis Stable, NP  nadolol (  CORGARD) 40 MG tablet Take 1 tablet (40 mg total) by mouth daily. And 1/2 tablet in evening Patient taking differently: Take 20-40 mg by mouth 2 (two) times daily.  04/25/17   Annitta Needs, NP  ondansetron (ZOFRAN ODT) 4 MG disintegrating tablet Take 1 tablet (4 mg total) by mouth every 8 (eight) hours as needed. 04/17/18   Isla Pence, MD  pantoprazole (PROTONIX) 40 MG tablet Take 40 mg by mouth daily.    [provider]  rifaximin (XIFAXAN) 550 MG TABS tablet Take 1 tablet (550 mg total) by mouth 2 (two) times daily. 02/25/18   Carlis Stable, NP  spironolactone (ALDACTONE) 50 MG tablet Take 1 tablet po  bid 04/03/18   Caren Macadam, MD  ursodiol (ACTIGALL) 500 MG tablet Take 1 tablet (500 mg total) by mouth 2 (two) times daily. 11/06/17   Annitta Needs, NP  Vitamin D, Ergocalciferol, (DRISDOL) 50000 units CAPS capsule Take 1 capsule (50,000 Units total) by mouth every 7 (seven) days. 10/30/17   Caren Macadam, MD    Family History Family History  Problem Relation Age of Onset  . COPD Mother   . Liver disease Neg Hx   . Colon cancer Neg Hx   . Colon polyps Neg Hx     Social History Social History   Tobacco Use  . Smoking status: Never Smoker  . Smokeless tobacco: Never Used  Substance Use Topics  . Alcohol use: No    Comment: Quit September 2017, heavy alcohol abuse previously  . Drug use: No     Allergies   Patient has no known allergies.   Review of Systems Review of Systems  Eyes: Positive for visual disturbance.  Gastrointestinal: Positive for abdominal pain and nausea.  Neurological: Positive for weakness and headaches.  All other systems reviewed and are negative.    Physical Exam Updated Vital Signs BP 133/72   Pulse 67   Temp 97.9 F (36.6 C) (Oral)   Resp 18   SpO2 99%   Physical Exam  Constitutional: He is oriented to person, place, and time. He appears well-developed and well-nourished.  HENT:  Head: Normocephalic and atraumatic.  Mouth/Throat: Oropharynx is clear and moist.  Eyes: Pupils are equal, round, and reactive to light. EOM are normal.  Neck: Normal range of motion. Neck supple.  Cardiovascular: Normal rate and regular rhythm.  Pulmonary/Chest: Effort normal and breath sounds normal.  Abdominal: There is tenderness in the epigastric area.  Musculoskeletal: Normal range of motion.  Neurological: He is alert and oriented to person, place, and time. He has normal strength. He displays a negative Romberg sign.  Skin: Skin is warm and dry.  Psychiatric: He has a normal mood and affect. His behavior is normal.  Nursing note and vitals  reviewed.    ED Treatments / Results  Labs (all labs ordered are listed, but only abnormal results are displayed) Labs Reviewed  COMPREHENSIVE METABOLIC PANEL - Abnormal; Notable for the following components:      Result Value   Sodium 132 (*)    Glucose, Bld 178 (*)    Total Protein 9.3 (*)    All other components within normal limits  URINALYSIS, ROUTINE W REFLEX MICROSCOPIC  CBC WITH DIFFERENTIAL/PLATELET  TROPONIN I  AMMONIA  LIPASE, BLOOD  PROTIME-INR    EKG EKG Interpretation  Date/Time:  Friday April 17 2018 17:39:59 EDT Ventricular Rate:  64 PR Interval:    QRS Duration: 93 QT Interval:  433 QTC Calculation:  447 R Axis:   -9 Text Interpretation:  Sinus rhythm Abnormal R-wave progression, early transition Confirmed by Isla Pence [phone]) on 04/17/2018 5:46:00 PM   Radiology Dg Chest 2 View  Result Date: 04/17/2018 CLINICAL DATA:  Nissen headache x4 days. EXAM: CHEST - 2 VIEW COMPARISON:  None. FINDINGS: The heart size and mediastinal contours are within normal limits. Both lungs are clear. Mild degenerative change about the [facility] and glenohumeral joints. Possible posttraumatic deformity of the left humeral head. Cholecystectomy clips are present. IMPRESSION: No active cardiopulmonary disease. Electronically Signed   By: Ashley Royalty M.D.   On: 04/17/2018 19:08   Ct Head Wo Contrast  Result Date: 04/17/2018 CLINICAL DATA:  Worst headache of life EXAM: CT HEAD WITHOUT CONTRAST TECHNIQUE: Contiguous axial images were obtained from the base of the skull through the vertex without intravenous contrast. COMPARISON:  None. FINDINGS: BRAIN: Mild superficial and central atrophy. No intraparenchymal hemorrhage, mass effect nor midline shift. No acute large vascular territory infarcts. Grey-white matter distinction is maintained. The basal ganglia are unremarkable. No abnormal extra-axial fluid collections. Basal cisterns are not effaced and midline. The brainstem and  cerebellar hemispheres are without acute abnormalities. Prominent cleft versus focus of encephalomalacia involving left cerebellum inferiorly. VASCULAR: Unremarkable. SKULL/SOFT TISSUES: No skull fracture. No significant soft tissue swelling. ORBITS/SINUSES: The included ocular globes and orbital contents are normal.The mastoid air cells are clear. The included paranasal sinuses are well-aerated. OTHER: None. IMPRESSION: No acute intracranial abnormality.  Atrophy. Electronically Signed   By: Ashley Royalty M.D.   On: 04/17/2018 19:20   Ct Abdomen Pelvis W Contrast  Result Date: 04/17/2018 CLINICAL DATA:  Weakness all over with acute generalized abdominal pain. EXAM: CT ABDOMEN AND PELVIS WITH CONTRAST TECHNIQUE: Multidetector CT imaging of the abdomen and pelvis was performed using the standard protocol following bolus administration of intravenous contrast. CONTRAST:  150mL ISOVUE-300 IOPAMIDOL (ISOVUE-300) INJECTION 61% COMPARISON:  10/14/2017 FINDINGS: Lower chest: Gynecomastia the left. Heart size is top-normal. Minimal dependent atelectasis at the lung bases. Hepatobiliary: Cirrhotic appearance of the liver with trace perihepatic ascites. Cholecystectomy. No space-occupying mass or biliary dilatation is noted. Pancreas: Normal without mass or ductal dilatation. Spleen: No splenomegaly. Adrenals/Urinary Tract: Normal bilateral adrenal glands. Renal cortical scarring of both kidneys, greater on the left. Punctate calculus in the upper the right kidney without obstruction. No nephrolithiasis on the left. No hydroureteronephrosis. 7 mm hypodensity in the lower pole of the right kidney likely to represent a small cyst. This is too small to further characterize. The urinary bladder is unremarkable for the degree of distention. Stomach/Bowel: The stomach is decompressed in appearance. The duodenal sweep and ligament of Treitz are normal. No small bowel obstruction or inflammation. No mural thickening. The distal and  terminal ileum as well as appendix are normal. Average amount of fecal retention within the colon. Vascular/Lymphatic: Mild aortoiliac atherosclerosis. Scattered upper abdominal retroperitoneal lymph nodes without pathologic enlargement. Reproductive: Normal size prostate. Other: Small volume ascites in the pelvis and adjacent to the liver and spleen. Musculoskeletal: No acute nor suspicious osseous abnormality. IMPRESSION: Morphologic changes of cirrhosis with gynecomastia and small volume ascites. Negative for enhancing hepatic mass. Renal cortical scarring punctate nonobstructing upper pole right renal calculus and 7 mm, too small to characterize cyst of the lower pole. Electronically Signed   By: Ashley Royalty M.D.   On: 04/17/2018 19:30    Procedures Procedures (including critical care time)  Medications Ordered in ED Medications  sodium chloride 0.9 % bolus 500 mL (  0 mLs Intravenous Stopped 04/17/18 1924)  ondansetron (ZOFRAN) injection 4 mg (4 mg Intravenous Given 04/17/18 1829)  iopamidol (ISOVUE-300) 61 % injection 100 mL (100 mLs Intravenous Contrast Given 04/17/18 1907)     Initial Impression / Assessment and Plan / ED Course  I have reviewed the triage vital signs and the nursing notes.  Pertinent labs & imaging results that were available during my care of the patient were reviewed by me and considered in my medical decision making (see chart for details).    Pt is no longer orthostatic.  He is feeling a little better with zofran.  CTs don't show anything acute.  Labs are unremarkable.  Sx have been going on for several days.  Nothing acute to treat in ED.  Pt said his pcp has left the office and the office is not going to absorb her patients.  He is given the number to the [facility] clinic to establish primary care.  He is instructed to f/u with GI.  Return if worse.  Final Clinical Impressions(s) / ED Diagnoses   Final diagnoses:  Nonintractable headache, unspecified chronicity  pattern, unspecified headache type  Nausea  Hepatic cirrhosis, unspecified hepatic cirrhosis type, unspecified whether ascites present [facility])    ED Discharge Orders         Ordered    ondansetron (ZOFRAN ODT) 4 MG disintegrating tablet  Every 8 hours PRN     04/17/18 1949           Isla Pence, MD 04/17/18 1952

## 2018-04-17 NOTE — ED Notes (Signed)
Received report on pt, pt updated on plan of care, comfort measures provided,  

## 2018-04-17 NOTE — Telephone Encounter (Signed)
I wasn't able to address this yesterday as I was not here in the afternoon, please let patient know sorry for the late reply.  If he has a distended abdomen, fever, abdominal pain, would need to go to ED. However, if his abdomen is not distended and he is feeling better, monitor symptoms. We can check a CBC and INR. If persistent nose bleeding, seek medical attention.

## 2018-04-17 NOTE — ED Notes (Signed)
Pt was informed that we need a urine sample. Pt states that he can not urinate at this time. 

## 2018-04-17 NOTE — Telephone Encounter (Signed)
Spoke with pt and he thinks he's going to go to the ED because he isn't feel much better. He wants to hold off on lab work due to going to the ED. Pt said they will possibly do labs when he's evaluated.

## 2018-04-17 NOTE — ED Triage Notes (Addendum)
Pt c/o weakness all over and headache x 4 days. Intermittent left nostril bleeding since yesterday. Nausea. Denies v/d. No obvious weakness noted at this time. lnbm today. C/o blurred vision, seen by eye dr and was told h/a not coming from vision. Pt ambulatory with steady gait. Denies stiff or painful neck

## 2018-04-23 ENCOUNTER — Encounter: Payer: Self-pay | Admitting: Nurse Practitioner

## 2018-04-23 ENCOUNTER — Ambulatory Visit (INDEPENDENT_AMBULATORY_CARE_PROVIDER_SITE_OTHER): Payer: Self-pay | Admitting: Nurse Practitioner

## 2018-04-23 VITALS — BP 123/69 | HR 68 | Temp 98.3°F | Ht 68.0 in | Wt 169.0 lb

## 2018-04-23 DIAGNOSIS — R1084 Generalized abdominal pain: Secondary | ICD-10-CM

## 2018-04-23 DIAGNOSIS — K7460 Unspecified cirrhosis of liver: Secondary | ICD-10-CM

## 2018-04-23 NOTE — Assessment & Plan Note (Signed)
The patient has persistent right lower quadrant abdominal pain as his primary complaint.  He did have right lower quadrant inguinal hernia repair a year ago and states the pain is been there since.  His CT of his abdomen is essentially normal other than known cirrhosis, which is reassuring.  His labs look great.  I feel his pain, given its descriptors/nature are likely related to adhesions and scar tissue postoperatively.  Unfortunately, there is not much that we can do.  I will offer him a referral back to surgery to address or make recommendations.  In the meantime he can follow-up in our office in 6 months for routine cirrhosis care.  Call if any worsening or severe pain.

## 2018-04-23 NOTE — Assessment & Plan Note (Signed)
Known cirrhosis presumed alcohol-related.  He was seen by [facility] liver clinic recommended substance abuse counseling for 6 months.  Most recent meld score calculated at [facility] with a meld score of 8.  His CT completed in the emergency department was unremarkable other than known cirrhosis.  His labs look quite good for cirrhosis with essentially normal LFTs.  At this point I will have him follow-up in 6 months for routine cirrhosis care.

## 2018-04-23 NOTE — Patient Instructions (Signed)
1. As we discussed, I will refer you back to Dr. Arnoldo Morale to discuss possible adhesions/scar tissue pain. 2. We will help you get established with a primary care provider. 3. Return for follow-up in 6 months for routine liver care. 4. Call us if you have any questions or concerns.  At [facility] we value your feedback. You may receive a survey about your visit today. Please share your experience as we strive to create trusting relationships with our patients to provide genuine, compassionate, quality care.  We appreciate your understanding and patience as we review any laboratory studies, imaging, and other diagnostic tests that are ordered as we care for you. Our office policy is 5 business days for review of these results, and any emergent or urgent results are addressed in a timely manner for your best interest. If you do not hear from our office in 1 week, please contact us.   We also encourage the use of MyChart, which contains your medical information for your review as well. If you are not enrolled in this feature, an access code is on this after visit summary for your convenience. Thank you for allowing [place] to be involved in your care.  It was great to see you today!  I hope you have a great Fall!!

## 2018-04-23 NOTE — Progress Notes (Signed)
Referring Provider: Caren Macadam, MD Primary Care Physician:  Caren Macadam, MD Primary GI:  Dr. Gala Romney  Chief Complaint  Patient presents with  . Abdominal Pain    constant,     HPI:   Thomas Pratt is a 57 y.o. male who presents for ER follow-up.  Patient was last seen in our office 02/25/2018 for primary biliary cholangitis, cirrhosis, history of alcohol abuse.  Known history of cirrhosis presumed alcohol abuse although no alcohol since September 2017.  Positive AMA, ASMA, liver biopsy with granulomatosis hepatitis with differentials including PBC, sarcoidosis, idiopathic granulomatosis, drug-induced liver disease.  Patient was started on Urso due to elevated alk phos and positive ANA for presumed PBC.  Referred to [facility] liver clinic but was denied as of 11/20/2017.    EGD and colonoscopy in August 2018 with tubular adenoma, grade 1 esophageal varices, erosive gastritis, portal gastropathy.  CT of the abdomen and pelvis in March 2019 with no hepatoma, no acute findings, stable cirrhosis with moderate volume ascites.  At one point he was having worsening fatigue and edema.  Some noted weight gain.  He was subsequently started on a standard diuretic dose of Lasix 40 mg and Spironolactone 100 mg.  Reinforced low-salt diet.  Recommended continue exploration of liver clinic referral and recommend pursuing Medicaid/disability.  [facility] liver clinic except as referral.  Appointment scheduled for July 2019 and seen by [facility] transplant hematology who recommended continue Corgard 40 mg daily and 20 mg in the afternoon, lactulose and rifaximin by local GI, continue diuretics and Urso recommended no further refills until liver biopsy returned.  Meld score calculated to 8.  Required 6 months of alcohol/substance abuse counseling prior to evaluation for transplant.  At his last visit he was doing okay and was just approved for disability and Medicare/Medicaid.  Some edema although abdominal  swelling improved.  He is bruising options for substance abuse counseling and query availability of outpatient substance abuse counseling at a local facility fellowship all.  Take good.  Has cut way back on salt and is started reading food labels for sodium content.  Reinforced daily limits of sodium.  Generally asymptomatic from a GI and hepatic standpoint.  Recommended update liver ultrasound, pursue substance abuse counseling options, continue lactulose and Xifaxan, follow-up in 6 months.  General ultrasound completed 03/04/2018 found cirrhosis with no discrete liver lesions, small volume perihepatic ascites less than previous CT.  He is next due for endoscopy in August 2020.  The patient presented to the emergency department 04/17/2018 with complaints of non-intractable headache, nausea, cirrhosis.  Primary concerns were headache, weakness, abdominal pain.  Symptoms ongoing for weeks, nausea with eating but able to keep foods down.  I evaluation noted to be okay.  Diuretics were decreased due to orthostatic hypotension.  Noted nosebleeds in the left nostril for 4 days.  EKG with no acute findings.  CBC essentially normal, platelets normal at 229 and hemoglobin normal at 13.4.  CMP with low sodium at 132, normal AST/ALT, alkaline phosphatase, bilirubin.  Ammonia normal at 33, lipase normal at 45, INR normal at 1.08.  CT of the abdomen and pelvis found morphologic changes of cirrhosis with gynecomastia and small volume ascites, negative for enhancing hepatic mass.  Renal cortical scarring punctate nonobstructing upper pole right renal calculus and 7 mm too small to characterize cyst of the lower pole.  No other acute findings.  After evaluation in the emergency department the patient was no longer orthostatic, feeling better with  Zofran.  CTs unremarkable.  Symptoms for several days and nothing acute to treat in the ER.  He was given a referral to a new primary care being at his old primary care is leaving.   Recommended follow-up with GI and return if worse.  Today he states he's not doing great. Still with an ongoing headache, about as bad; centered around the eyes. Eye evaluation was unremarkable. Nausea is improved. No GERD symptoms. Abdominal pain is ongoing from his last visit, although seems to be more frequent. Eating more (too much) makes the pain worse. Denies significant bloating. Has a bowel movement 3-4 times a week. Stools are consistent with [facility] 4, no straining. No incomplete emptying. Denies yellowing of skin/eyes, darkened urine, generalized pruritis, generalized confusion. No new medications, OTC, herbs, or supplements. Feels like he's having some minimal increase in swelling with reduction in diuretics. Denies hematochezia, melena, fever, chills, unintentional weight loss. Sometimes feels like something is pressing all over his body, like a "panic attack, significant anxiety." Some persistent fatigue. Denies chest pain, dyspnea, dizziness, lightheadedness, syncope, near syncope. Denies any other upper or lower GI symptoms.  Overall the pain is persistent, nagging, and frustrating. Somewhat worse compared to last visit. Pain has been RLQ since his hernia repair in 2018.  Not taking Xifaxan due to cost. Currently has Medicaid. Awaiting Medicare/disability.  Past Medical History:  Diagnosis Date  . Cirrhosis ([place])    likely ETOH use     Past Surgical History:  Procedure Laterality Date  . CHOLECYSTECTOMY N/A 09/01/2017   Procedure: LAPAROSCOPIC CHOLECYSTECTOMY;  Surgeon: Aviva Signs, MD;  Location: [facility] ORS;  Service: General;  Laterality: N/A;  . COLONOSCOPY WITH PROPOFOL N/A 03/06/2017   Dr. Gala Romney: tubular adenoma removed, next TCS 5 years if health permits.   . ESOPHAGOGASTRODUODENOSCOPY (EGD) WITH PROPOFOL N/A 03/06/2017   Dr. Gala Romney: grade 1 esophageal varices, erosive esophagitis, portal gastropathy.   Marland Kitchen HERNIA REPAIR Right    RIH  . INGUINAL HERNIA REPAIR Right 07/11/2017    Procedure: HERNIA REPAIR INGUINAL ADULT WITH MESH;  Surgeon: Aviva Signs, MD;  Location: [facility] ORS;  Service: General;  Laterality: Right;  patient knows to arrive at 7:30  . IR TRANSCATHETER BX  10/14/2017  . IR US GUIDE VASC ACCESS RIGHT  10/14/2017  . IR VENOGRAM HEPATIC WO HEMODYNAMIC EVALUATION  10/14/2017  . POLYPECTOMY  03/06/2017   Procedure: POLYPECTOMY;  Surgeon: Daneil Dolin, MD;  Location: [facility] ENDO SUITE;  Service: Endoscopy;;  ascending colon;    Current Outpatient Medications  Medication Sig Dispense Refill  . citalopram (CELEXA) 20 MG tablet Take 1 tablet (20 mg total) by mouth daily. 30 tablet 3  . furosemide (LASIX) 40 MG tablet Take 1 tablet (40 mg total) by mouth daily. (Patient taking differently: Take 40 mg by mouth 2 (two) times daily. ) 30 tablet 5  . lactulose, encephalopathy, (CHRONULAC) 10 GM/15ML SOLN Take 15 mLs (10 g total) by mouth 2 (two) times daily as needed. Adjust dosing for 2-3 soft stools a day 2700 mL 2  . nadolol (CORGARD) 40 MG tablet Take 1 tablet (40 mg total) by mouth daily. And 1/2 tablet in evening (Patient taking differently: Take 20-40 mg by mouth 2 (two) times daily. ) 60 tablet 3  . ondansetron (ZOFRAN ODT) 4 MG disintegrating tablet Take 1 tablet (4 mg total) by mouth every 8 (eight) hours as needed. 10 tablet 0  . pantoprazole (PROTONIX) 40 MG tablet Take 40 mg by mouth daily.    Marland Kitchen  rifaximin (XIFAXAN) 550 MG TABS tablet Take 1 tablet (550 mg total) by mouth 2 (two) times daily. 60 tablet 3  . spironolactone (ALDACTONE) 50 MG tablet Take 1 tablet po bid 60 tablet 0  . ursodiol (ACTIGALL) 500 MG tablet Take 1 tablet (500 mg total) by mouth 2 (two) times daily. 60 tablet 5  . Vitamin D, Ergocalciferol, (DRISDOL) 50000 units CAPS capsule Take 1 capsule (50,000 Units total) by mouth every 7 (seven) days. 12 capsule 0   No current facility-administered medications for this visit.     Allergies as of 04/23/2018  . (No Known Allergies)    Family  History  Problem Relation Age of Onset  . COPD Mother   . Liver disease Neg Hx   . Colon cancer Neg Hx   . Colon polyps Neg Hx     Social History   Socioeconomic History  . Marital status: Divorced    Spouse name: Not on file  . Number of children: Not on file  . Years of education: Not on file  . Highest education level: Not on file  Occupational History  . Occupation: unemployed  Social Needs  . Financial resource strain: Not on file  . Food insecurity:    Worry: Not on file    Inability: Not on file  . Transportation needs:    Medical: Not on file    Non-medical: Not on file  Tobacco Use  . Smoking status: Never Smoker  . Smokeless tobacco: Never Used  Substance and Sexual Activity  . Alcohol use: No    Comment: Quit September 2017, heavy alcohol abuse previously  . Drug use: No  . Sexual activity: Not Currently    Partners: Female  Lifestyle  . Physical activity:    Days per week: Not on file    Minutes per session: Not on file  . Stress: Not on file  Relationships  . Social connections:    Talks on phone: Not on file    Gets together: Not on file    Attends religious service: Not on file    Active member of club or organization: Not on file    Attends meetings of clubs or organizations: Not on file    Relationship status: Not on file  Other Topics Concern  . Not on file  Social History Narrative   Lives in [place], [place]. 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    Drank alcohol for years, quit 05/07/16. Has been sober since then. Quit b/c of health/cirrhosis.   Divorced for over 15 years.   Dating sporadically.   Has 3 daughters and 1 son live locally.   Originally from [place], moved here b/c of wife.    Eats all foods.    Wear seatbelt.   Does not drive, does not have a license, lost due to DWI.    Brought by neighbor.    Does not go to church. Believes in God. Prays each night, per report.     Review of Systems: General: Negative for  anorexia, weight loss, fever, chills. ENT: Negative for hoarseness, difficulty swallowing , nasal congestion. CV: Negative for chest pain, angina, palpitations, peripheral edema.  Respiratory: Negative for dyspnea at rest, cough, sputum, wheezing.  GI: See history of present illness. Endo: Negative for unusual weight change.    Physical Exam: BP 123/69   Pulse 68   Temp 98.3 F (36.8 C) (Oral)   Ht _0  (1.727 m)   Wt 169  lb (76.7 kg)   BMI 25.70 kg/m  General:   Alert and oriented. Pleasant and cooperative. Well-nourished and well-developed.  Eyes:  Without icterus, sclera clear and conjunctiva pink.  Ears:  Normal auditory acuity. Cardiovascular:  S1, S2 present without murmurs appreciated. Extremities without clubbing or edema. Respiratory:  Clear to auscultation bilaterally. No wheezes, rales, or rhonchi. No distress.  Gastrointestinal:  +BS, soft, non-tender and non-distended. No HSM noted. No guarding or rebound. No masses appreciated.  Rectal:  Deferred  Musculoskalatal:  Symmetrical without gross deformities. Neurologic:  Alert and oriented x4;  grossly normal neurologically. Psych:  Alert and cooperative. Normal mood and affect. Heme/Lymph/Immune: No excessive bruising noted.    04/23/2018 11:58 AM   Disclaimer: This note was dictated with voice recognition software. Similar sounding words can inadvertently be transcribed and may not be corrected upon review.

## 2018-04-27 ENCOUNTER — Other Ambulatory Visit: Payer: Self-pay | Admitting: *Deleted

## 2018-04-27 ENCOUNTER — Encounter: Payer: Self-pay | Admitting: *Deleted

## 2018-04-27 DIAGNOSIS — K7460 Unspecified cirrhosis of liver: Secondary | ICD-10-CM

## 2018-04-27 NOTE — Progress Notes (Signed)
CC'D TO PCP °

## 2018-05-12 ENCOUNTER — Ambulatory Visit: Payer: Self-pay | Admitting: Gastroenterology

## 2018-05-18 ENCOUNTER — Encounter: Payer: Self-pay | Admitting: Family

## 2018-05-18 ENCOUNTER — Ambulatory Visit (INDEPENDENT_AMBULATORY_CARE_PROVIDER_SITE_OTHER): Payer: Medicaid Other | Admitting: Family

## 2018-05-18 VITALS — BP 112/69 | HR 72 | Temp 97.7°F | Ht 68.0 in | Wt 177.6 lb

## 2018-05-18 DIAGNOSIS — E871 Hypo-osmolality and hyponatremia: Secondary | ICD-10-CM

## 2018-05-18 DIAGNOSIS — Z0000 Encounter for general adult medical examination without abnormal findings: Secondary | ICD-10-CM

## 2018-05-18 DIAGNOSIS — K7031 Alcoholic cirrhosis of liver with ascites: Secondary | ICD-10-CM

## 2018-05-18 DIAGNOSIS — I10 Essential (primary) hypertension: Secondary | ICD-10-CM

## 2018-05-18 DIAGNOSIS — K7460 Unspecified cirrhosis of liver: Secondary | ICD-10-CM

## 2018-05-18 DIAGNOSIS — E559 Vitamin D deficiency, unspecified: Secondary | ICD-10-CM

## 2018-05-18 DIAGNOSIS — E1169 Type 2 diabetes mellitus with other specified complication: Secondary | ICD-10-CM

## 2018-05-18 DIAGNOSIS — F1011 Alcohol abuse, in remission: Secondary | ICD-10-CM

## 2018-05-18 LAB — BAYER DCA HB A1C WAIVED: HB A1C (BAYER DCA - WAIVED): 6.2 % (ref <7.0)

## 2018-05-18 NOTE — Patient Instructions (Signed)

## 2018-05-18 NOTE — Progress Notes (Signed)
Subjective:    Patient ID: Thomas Pratt, male    DOB: [birth year], 57 y.o.   MRN: [id number]  Chief Complaint  Patient presents with  . New Patient (Initial Visit)   Pt presents to the office today to establish care and CPE. He states he was on Medicaid, but got disability. He states now he does not have insurance coverage. He can not afford his medications.   He has alcoholic cirrhosis and ascites is followed by GI  Every 6 months.   He is currently rifaximin and ursodiol.  States he quit drinking 2 years and 4 months ago.  Diabetes  He presents for his follow-up diabetic visit. He has type 2 diabetes mellitus. Pertinent negatives for diabetes include no blurred vision, no foot paresthesias and no visual change. Pertinent negatives for diabetic complications include no CVA or peripheral neuropathy. Risk factors for coronary artery disease include diabetes mellitus and male sex. (Does not check BS at home)  Hypertension  This is a chronic problem. The current episode started more than 1 year ago. The problem has been resolved since onset. The problem is controlled. Associated symptoms include malaise/fatigue and peripheral edema. Pertinent negatives include no blurred vision. Risk factors for coronary artery disease include diabetes mellitus. The current treatment provides moderate improvement. There is no history of CVA.  Depression         This is a chronic problem.  The current episode started more than 1 year ago.   The onset quality is gradual.   The problem occurs intermittently.     Review of Systems  Constitutional: Positive for malaise/fatigue.  Eyes: Negative for blurred vision.  Psychiatric/Behavioral: Positive for depression.  All other systems reviewed and are negative.  Family History  Problem Relation Age of Onset  . COPD Mother   . Liver disease Neg Hx   . Colon cancer Neg Hx   . Colon polyps Neg Hx     Social History   Socioeconomic History  . Marital  status: Divorced    Spouse name: Not on file  . Number of children: Not on file  . Years of education: Not on file  . Highest education level: Not on file  Occupational History  . Occupation: unemployed  Social Needs  . Financial resource strain: Not on file  . Food insecurity:    Worry: Not on file    Inability: Not on file  . Transportation needs:    Medical: Not on file    Non-medical: Not on file  Tobacco Use  . Smoking status: Never Smoker  . Smokeless tobacco: Never Used  Substance and Sexual Activity  . Alcohol use: No    Comment: Quit September 2017, heavy alcohol abuse previously  . Drug use: No  . Sexual activity: Not Currently    Partners: Female  Lifestyle  . Physical activity:    Days per week: Not on file    Minutes per session: Not on file  . Stress: Not on file  Relationships  . Social connections:    Talks on phone: Not on file    Gets together: Not on file    Attends religious service: Not on file    Active member of club or organization: Not on file    Attends meetings of clubs or organizations: Not on file    Relationship status: Not on file  Other Topics Concern  . 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    Drank alcohol for years, quit 05/07/16. Has been sober since then. Quit b/c of health/cirrhosis.   Divorced for over 15 years.   Dating sporadically.   Has 3 daughters and 1 son live locally.   Originally from [place], moved here b/c of wife.    Eats all foods.    Wear seatbelt.   Does not drive, does not have a license, lost due to DWI.    Brought by neighbor.    Does not go to church. Believes in God. Prays each night, per report.         Objective:   Physical Exam  Constitutional: He is oriented to person, place, and time. He appears well-developed and well-nourished. No distress.  HENT:  Head: Normocephalic.  Right Ear: External ear normal.  Left Ear:  External ear normal.  Mouth/Throat: Oropharynx is clear and moist.  Eyes: Pupils are equal, round, and reactive to light. Right eye exhibits no discharge. Left eye exhibits no discharge.  Neck: Normal range of motion. Neck supple. No thyromegaly present.  Cardiovascular: Normal rate, regular rhythm, normal heart sounds and intact distal pulses.  No murmur heard. Pulmonary/Chest: Effort normal and breath sounds normal. No respiratory distress. He has no wheezes.  Abdominal: Soft. Bowel sounds are normal. He exhibits no distension. There is no tenderness (mild RUQ).  Musculoskeletal: Normal range of motion. He exhibits tenderness (trave BLE). He exhibits no edema.  Neurological: He is alert and oriented to person, place, and time. He has normal reflexes. No cranial nerve deficit.  Skin: Skin is warm and dry. No rash noted. No erythema.  Psychiatric: He has a normal mood and affect. His behavior is normal. Judgment and thought content normal.  Vitals reviewed.   BP 112/69   Pulse 72   Temp 97.7 F (36.5 C) (Oral)   Ht '5\' 8"'  (1.727 m)   Wt 177 lb 9.6 oz (80.6 kg)   BMI 27.00 kg/m      Assessment & Plan:  Thomas Pratt comes in today with chief complaint of New Patient (Initial Visit)   Diagnosis and orders addressed:  1. Annual physical exam - CMP14+EGFR - CBC with Differential/Platelet - VITAMIN D 25 Hydroxy (Vit-D Deficiency, Fractures) - Lipid panel - Bayer DCA Hb A1c Waived - TSH - PSA, total and free  2. Essential hypertension - CMP14+EGFR - CBC with Differential/Platelet  3. Cirrhosis of liver without ascites, unspecified hepatic cirrhosis type (HCC) - CMP14+EGFR - CBC with Differential/Platelet  4. Alcoholic cirrhosis of liver with ascites (HCC) - CMP14+EGFR - CBC with Differential/Platelet  5. Type 2 diabetes mellitus with other specified complication, without long-term current use of insulin (HCC) - CMP14+EGFR - CBC with Differential/Platelet - Lipid  panel - Bayer DCA Hb A1c Waived  6. Ascites due to alcoholic cirrhosis (HCC) - CMP14+EGFR - CBC with Differential/Platelet  7. Hyponatremia - CMP14+EGFR - CBC with Differential/Platelet  8. History of ETOH abuse - CMP14+EGFR - CBC with Differential/Platelet  9. Vitamin D deficiency - CMP14+EGFR - CBC with Differential/Platelet - VITAMIN D 25 Hydroxy (Vit-D Deficiency, Fractures)   Labs pending Health Maintenance reviewed Diet and exercise encouraged  Follow up plan: 6 months and keep all follow up appts with GI   Evelina Dun, FNP

## 2018-05-19 ENCOUNTER — Other Ambulatory Visit: Payer: Self-pay | Admitting: Family

## 2018-05-19 LAB — PSA, TOTAL AND FREE
PROSTATE SPECIFIC AG, SERUM: 0.3 ng/mL (ref 0.0–4.0)
PSA FREE: 0.09 ng/mL
PSA, Free Pct: 30 %

## 2018-05-19 LAB — CBC WITH DIFFERENTIAL/PLATELET
BASOS ABS: 11 {cells}/uL (ref 0–200)
Basophils Absolute: 0 10*3/uL (ref 0.0–0.2)
Basophils Relative: 0.2 %
Basos: 1 %
EOS (ABSOLUTE): 0.1 10*3/uL (ref 0.0–0.4)
EOS PCT: 0.9 %
Eos: 1 %
Eosinophils Absolute: 51 cells/uL (ref 15–500)
HEMATOCRIT: 32.5 % — AB (ref 38.5–50.0)
Hematocrit: 34.7 % — ABNORMAL LOW (ref 37.5–51.0)
Hemoglobin: 11.6 g/dL — ABNORMAL LOW (ref 13.0–17.7)
Hemoglobin: 11.2 g/dL — ABNORMAL LOW (ref 13.2–17.1)
IMMATURE GRANULOCYTES: 0 %
Immature Grans (Abs): 0 10*3/uL (ref 0.0–0.1)
Lymphocytes Absolute: 1.8 10*3/uL (ref 0.7–3.1)
Lymphs Abs: 1431 cells/uL (ref 850–3900)
Lymphs: 32 %
MCH: 30.1 pg (ref 26.6–33.0)
MCH: 30.4 pg (ref 27.0–33.0)
MCHC: 33.4 g/dL (ref 31.5–35.7)
MCHC: 34.5 g/dL (ref 32.0–36.0)
MCV: 90 fL (ref 79–97)
MCV: 88.3 fL (ref 80.0–100.0)
MPV: 11 fL (ref 7.5–12.5)
Monocytes Absolute: 0.5 10*3/uL (ref 0.1–0.9)
Monocytes Relative: 6 %
Monocytes: 8 %
NEUTROS ABS: 3865 {cells}/uL (ref 1500–7800)
NEUTROS PCT: 67.8 %
Neutrophils Absolute: 3.4 10*3/uL (ref 1.4–7.0)
Neutrophils: 58 %
PLATELETS: 217 10*3/uL (ref 150–450)
Platelets: 197 10*3/uL (ref 140–400)
RBC: 3.86 x10E6/uL — AB (ref 4.14–5.80)
RBC: 3.68 10*6/uL — ABNORMAL LOW (ref 4.20–5.80)
RDW: 13.1 % (ref 12.3–15.4)
RDW: 13 % (ref 11.0–15.0)
Total Lymphocyte: 25.1 %
WBC mixed population: 342 cells/uL (ref 200–950)
WBC: 5.8 10*3/uL (ref 3.4–10.8)
WBC: 5.7 10*3/uL (ref 3.8–10.8)

## 2018-05-19 LAB — COMPREHENSIVE METABOLIC PANEL
AG Ratio: 1 (calc) (ref 1.0–2.5)
ALKALINE PHOSPHATASE (APISO): 99 U/L (ref 40–115)
ALT: 21 U/L (ref 9–46)
AST: 28 U/L (ref 10–35)
Albumin: 3.8 g/dL (ref 3.6–5.1)
BILIRUBIN TOTAL: 0.6 mg/dL (ref 0.2–1.2)
BUN: 15 mg/dL (ref 7–25)
CO2: 26 mmol/L (ref 20–32)
Calcium: 9.5 mg/dL (ref 8.6–10.3)
Chloride: 101 mmol/L (ref 98–110)
Creat: 0.89 mg/dL (ref 0.70–1.33)
Globulin: 3.9 g/dL (calc) — ABNORMAL HIGH (ref 1.9–3.7)
Glucose, Bld: 273 mg/dL — ABNORMAL HIGH (ref 65–99)
Potassium: 4.7 mmol/L (ref 3.5–5.3)
Sodium: 134 mmol/L — ABNORMAL LOW (ref 135–146)
Total Protein: 7.7 g/dL (ref 6.1–8.1)

## 2018-05-19 LAB — LIPID PANEL
CHOL/HDL RATIO: 3 ratio (ref 0.0–5.0)
CHOLESTEROL TOTAL: 164 mg/dL (ref 100–199)
HDL: 54 mg/dL (ref >39)
LDL Calculated: 102 mg/dL — ABNORMAL HIGH (ref 0–99)
TRIGLYCERIDES: 42 mg/dL (ref 0–149)
VLDL Cholesterol Cal: 8 mg/dL (ref 5–40)

## 2018-05-19 LAB — CMP14+EGFR
A/G RATIO: 1 — AB (ref 1.2–2.2)
ALK PHOS: 106 IU/L (ref 39–117)
ALT: 28 IU/L (ref 0–44)
AST: 30 IU/L (ref 0–40)
Albumin: 3.9 g/dL (ref 3.5–5.5)
BUN/Creatinine Ratio: 16 (ref 9–20)
BUN: 14 mg/dL (ref 6–24)
Bilirubin Total: 0.4 mg/dL (ref 0.0–1.2)
CO2: 23 mmol/L (ref 20–29)
Calcium: 9.8 mg/dL (ref 8.7–10.2)
Chloride: 100 mmol/L (ref 96–106)
Creatinine, Ser: 0.87 mg/dL (ref 0.76–1.27)
GFR calc Af Amer: 110 mL/min/{1.73_m2} (ref >59)
GFR calc non Af Amer: 95 mL/min/{1.73_m2} (ref >59)
GLOBULIN, TOTAL: 4 g/dL (ref 1.5–4.5)
Glucose: 126 mg/dL — ABNORMAL HIGH (ref 65–99)
POTASSIUM: 4.7 mmol/L (ref 3.5–5.2)
SODIUM: 137 mmol/L (ref 134–144)
Total Protein: 7.9 g/dL (ref 6.0–8.5)

## 2018-05-19 LAB — PROTIME-INR
INR: 1.1
Prothrombin Time: 11.2 s (ref 9.0–11.5)

## 2018-05-19 LAB — TSH: TSH: 1.64 u[IU]/mL (ref 0.450–4.500)

## 2018-05-19 LAB — VITAMIN D 25 HYDROXY (VIT D DEFICIENCY, FRACTURES): Vit D, 25-Hydroxy: 33.9 ng/mL (ref 30.0–100.0)

## 2018-05-19 MED ORDER — ATORVASTATIN CALCIUM 10 MG PO TABS: 10.0000 mg | ORAL_TABLET | Freq: Every day | ORAL | 11 refills | Status: DC

## 2018-06-03 ENCOUNTER — Telehealth: Payer: Self-pay

## 2018-06-03 NOTE — Telephone Encounter (Signed)
-----   Message from Mahala Menghini, PA-C sent at 06/01/2018  3:33 PM EST ----- Sugar up but overall a1c good. Diet controlled dm. MELD 7. Hgb slightly low but stable.  EGD/TCS up to date.  Repeat CBC in 3 months.  OV with EG 2/20202 as scheduled.

## 2018-06-09 ENCOUNTER — Other Ambulatory Visit: Payer: Self-pay

## 2018-06-09 DIAGNOSIS — D649 Anemia, unspecified: Secondary | ICD-10-CM

## 2018-06-11 NOTE — Progress Notes (Signed)
CC'D TO PCP °

## 2018-06-24 ENCOUNTER — Encounter: Payer: Self-pay | Admitting: Physician Assistant

## 2018-06-24 ENCOUNTER — Ambulatory Visit: Payer: Self-pay | Admitting: Physician Assistant

## 2018-06-24 ENCOUNTER — Telehealth: Payer: Self-pay | Admitting: Family

## 2018-06-24 VITALS — BP 123/73 | HR 76 | Temp 97.2°F | Ht 68.0 in | Wt 182.4 lb

## 2018-06-24 DIAGNOSIS — B349 Viral infection, unspecified: Secondary | ICD-10-CM

## 2018-06-24 DIAGNOSIS — Z8719 Personal history of other diseases of the digestive system: Secondary | ICD-10-CM

## 2018-06-24 MED ORDER — AMOXICILLIN 500 MG PO CAPS: 500.0000 mg | ORAL_CAPSULE | Freq: Three times a day (TID) | ORAL | 0 refills | Status: DC

## 2018-06-26 NOTE — Progress Notes (Signed)
BP 123/73   Pulse 76   Temp (!) 97.2 F (36.2 C) (Oral)   Ht _0  (1.727 m)   Wt 182 lb 6.4 oz (82.7 kg)   BMI 27.73 kg/m    Subjective:    Patient ID: Thomas Pratt, male    DOB: [birth year], 57 y.o.   MRN: [id number]  HPI: Thomas Pratt is a 57 y.o. male presenting on 06/24/2018 for Headache; Edema; and Dizziness  This patient comes in complaining of headache, dizziness, feeling achy all over he was worried about having the flu.  He has not had any fever.  He does have known cirrhosis.  He also has known right hernia that have been repaired by Dr. Arnoldo Morale in the past.  He feels like that is also starting to come back so he would like to have a referral back to the surgeon.  He denies being around anyone with influenza or any other current illnesses.  He does not have a significant amount of drainage, he denies a cough.  He denies nausea vomiting or diarrhea.  Past Medical History:  Diagnosis Date  . Cirrhosis ([place])    likely ETOH use    Relevant past medical, surgical, family and social history reviewed and updated as indicated. Interim medical history since our last visit reviewed. Allergies and medications reviewed and updated. DATA REVIEWED: CHART IN EPIC  Family History reviewed for pertinent findings.  Review of Systems  Constitutional: Positive for fatigue. Negative for appetite change and fever.  HENT: Positive for congestion and sore throat. Negative for sinus pressure.   Eyes: Negative.  Negative for pain and visual disturbance.  Respiratory: Negative for cough, chest tightness, shortness of breath and wheezing.   Cardiovascular: Negative.  Negative for chest pain, palpitations and leg swelling.  Gastrointestinal: Positive for abdominal pain. Negative for diarrhea, nausea and vomiting.  Endocrine: Negative.   Genitourinary: Negative.   Musculoskeletal: Negative for back pain and myalgias.  Skin: Negative.  Negative for color change and rash.    Neurological: Positive for headaches. Negative for weakness and numbness.  Psychiatric/Behavioral: Negative.     Allergies as of 06/24/2018   No Known Allergies     Medication List        Accurate as of 06/24/18 11:59 PM. Always use your most recent med list.          amoxicillin 500 MG capsule Commonly known as:  AMOXIL Take 1 capsule (500 mg total) by mouth 3 (three) times daily.   atorvastatin 10 MG tablet Commonly known as:  LIPITOR Take 1 tablet (10 mg total) by mouth daily.   furosemide 40 MG tablet Commonly known as:  LASIX Take 1 tablet (40 mg total) by mouth daily.   nadolol 40 MG tablet Commonly known as:  CORGARD Take 1 tablet (40 mg total) by mouth daily. And 1/2 tablet in evening   spironolactone 50 MG tablet Commonly known as:  ALDACTONE Take 1 tablet po bid   Vitamin D (Ergocalciferol) 1.25 MG (50000 UT) Caps capsule Commonly known as:  DRISDOL Take 1 capsule (50,000 Units total) by mouth every 7 (seven) days.          Objective:    BP 123/73   Pulse 76   Temp (!) 97.2 F (36.2 C) (Oral)   Ht _1  (1.727 m)   Wt 182 lb 6.4 oz (82.7 kg)   BMI 27.73 kg/m   No Known Allergies  Wt Readings from Last 3 Encounters:  06/24/18 182 lb 6.4 oz (82.7 kg)  05/18/18 177 lb 9.6 oz (80.6 kg)  04/23/18 169 lb (76.7 kg)    Physical Exam  Constitutional: He appears well-developed and well-nourished. No distress.  HENT:  Head: Normocephalic and atraumatic.  Eyes: Pupils are equal, round, and reactive to light. Conjunctivae and EOM are normal.  Cardiovascular: Normal rate, regular rhythm and normal heart sounds.  Pulmonary/Chest: Effort normal and breath sounds normal. No respiratory distress.  Skin: Skin is warm and dry.  Psychiatric: He has a normal mood and affect. His behavior is normal.  Nursing note and vitals reviewed.   Results for orders placed or performed in visit on 05/18/18  CMP14+EGFR  Result Value Ref Range   Glucose 126 (H) 65 -  99 mg/dL   BUN 14 6 - 24 mg/dL   Creatinine, Ser 0.87 0.76 - 1.27 mg/dL   GFR calc non Af Amer 95 >59 mL/min/1.73   GFR calc Af Amer 110 >59 mL/min/1.73   BUN/Creatinine Ratio 16 9 - 20   Sodium 137 134 - 144 mmol/L   Potassium 4.7 3.5 - 5.2 mmol/L   Chloride 100 96 - 106 mmol/L   CO2 23 20 - 29 mmol/L   Calcium 9.8 8.7 - 10.2 mg/dL   Total Protein 7.9 6.0 - 8.5 g/dL   Albumin 3.9 3.5 - 5.5 g/dL   Globulin, Total 4.0 1.5 - 4.5 g/dL   Albumin/Globulin Ratio 1.0 (L) 1.2 - 2.2   Bilirubin Total 0.4 0.0 - 1.2 mg/dL   Alkaline Phosphatase 106 39 - 117 IU/L   AST 30 0 - 40 IU/L   ALT 28 0 - 44 IU/L  CBC with Differential/Platelet  Result Value Ref Range   WBC 5.8 3.4 - 10.8 x10E3/uL   RBC 3.86 (L) 4.14 - 5.80 x10E6/uL   Hemoglobin 11.6 (L) 13.0 - 17.7 g/dL   Hematocrit 34.7 (L) 37.5 - 51.0 %   MCV 90 79 - 97 fL   MCH 30.1 26.6 - 33.0 pg   MCHC 33.4 31.5 - 35.7 g/dL   RDW 13.1 12.3 - 15.4 %   Platelets 217 150 - 450 x10E3/uL   Neutrophils 58 Not Estab. %   Lymphs 32 Not Estab. %   Monocytes 8 Not Estab. %   Eos 1 Not Estab. %   Basos 1 Not Estab. %   Neutrophils Absolute 3.4 1.4 - 7.0 x10E3/uL   Lymphocytes Absolute 1.8 0.7 - 3.1 x10E3/uL   Monocytes Absolute 0.5 0.1 - 0.9 x10E3/uL   EOS (ABSOLUTE) 0.1 0.0 - 0.4 x10E3/uL   Basophils Absolute 0.0 0.0 - 0.2 x10E3/uL   Immature Granulocytes 0 Not Estab. %   Immature Grans (Abs) 0.0 0.0 - 0.1 x10E3/uL  VITAMIN D 25 Hydroxy (Vit-D Deficiency, Fractures)  Result Value Ref Range   Vit D, 25-Hydroxy 33.9 30.0 - 100.0 ng/mL  Lipid panel  Result Value Ref Range   Cholesterol, Total 164 100 - 199 mg/dL   Triglycerides 42 0 - 149 mg/dL   HDL 54 >39 mg/dL   VLDL Cholesterol Cal 8 5 - 40 mg/dL   LDL Calculated 102 (H) 0 - 99 mg/dL   Chol/HDL Ratio 3.0 0.0 - 5.0 ratio  Bayer DCA Hb A1c Waived  Result Value Ref Range   HB A1C (BAYER DCA - WAIVED) 6.2 <7.0 %  TSH  Result Value Ref Range   TSH 1.640 0.450 - 4.500 uIU/mL  PSA, total  and free  Result Value Ref Range  Prostate Specific Ag, Serum 0.3 0.0 - 4.0 ng/mL   PSA, Free 0.09 N/A ng/mL   PSA, Free Pct 30.0 %      Assessment & Plan:   1. Viral illness - amoxicillin (AMOXIL) 500 MG capsule; Take 1 capsule (500 mg total) by mouth 3 (three) times daily.  Dispense: 30 capsule; Refill: 0  2. History of right inguinal hernia - Ambulatory referral to General Surgery   Continue all other maintenance medications as listed above.  Follow up plan: No follow-ups on file.  Educational handout given for [place] PA-C [place] [address]  [place], [place] [address] [phone]   06/26/2018, 1:47 PM

## 2018-07-03 ENCOUNTER — Telehealth: Payer: Self-pay

## 2018-07-03 MED ORDER — DEXLANSOPRAZOLE 60 MG PO CPDR: 60.0000 mg | DELAYED_RELEASE_CAPSULE | Freq: Every day | ORAL | 3 refills | Status: DC

## 2018-07-03 NOTE — Telephone Encounter (Signed)
Printed prescription  

## 2018-07-03 NOTE — Telephone Encounter (Signed)
Faxed

## 2018-07-03 NOTE — Telephone Encounter (Signed)
LATE ENTRY:  I received a phone call on 07/01/2018 from [facility], requesting a new prescription of Dexilant be faxed to them for patient assistance.  Phone # [phone]  Fax #   [phone]  Forwarding to Roseanne Kaufman, NP who is doing Patient Assistance this week.

## 2018-07-17 ENCOUNTER — Telehealth: Payer: Self-pay

## 2018-07-17 NOTE — Telephone Encounter (Signed)
Unable to leave a voicemail message

## 2018-07-17 NOTE — Telephone Encounter (Signed)
[facility] - Left Message  

## 2018-07-23 ENCOUNTER — Encounter: Payer: Self-pay | Admitting: General Surgery

## 2018-07-23 ENCOUNTER — Ambulatory Visit (INDEPENDENT_AMBULATORY_CARE_PROVIDER_SITE_OTHER): Payer: Self-pay | Admitting: General Surgery

## 2018-07-23 VITALS — BP 153/73 | HR 80 | Temp 98.9°F | Resp 20 | Wt 186.6 lb

## 2018-07-23 DIAGNOSIS — R1031 Right lower quadrant pain: Secondary | ICD-10-CM

## 2018-07-24 NOTE — Progress Notes (Signed)
Thomas Pratt; [id number]; 01/28/1961   HPI Patient is a 58 year old male who was referred to my care by Evelina Dun for evaluation treatment of right groin pain.  Patient status post right inguinal herniorrhaphy with mesh in December 2018.  He states he intermittently has right groin pain.  He has not noticed a lump.  The pain seems to be localized to the right groin with minimal radiation.  It is made worse with straining.  Is been occurring over the past few months.  He states he occasionally has 7 out of 10 pain.  No fever chills have been noted.  He continues to abstain from alcohol.  He does have multiple comorbidities. Past Medical History:  Diagnosis Date  . Cirrhosis ([place])    likely ETOH use     Past Surgical History:  Procedure Laterality Date  . CHOLECYSTECTOMY N/A 09/01/2017   Procedure: LAPAROSCOPIC CHOLECYSTECTOMY;  Surgeon: Aviva Signs, MD;  Location: [facility];  Service: General;  Laterality: N/A;  . COLONOSCOPY WITH PROPOFOL N/A 03/06/2017   Dr. Gala Romney: tubular adenoma removed, next TCS 5 years if health permits.   . ESOPHAGOGASTRODUODENOSCOPY (EGD) WITH PROPOFOL N/A 03/06/2017   Dr. Gala Romney: grade 1 esophageal varices, erosive esophagitis, portal gastropathy.   Marland Kitchen HERNIA REPAIR Right    RIH  . INGUINAL HERNIA REPAIR Right 07/11/2017   Procedure: HERNIA REPAIR INGUINAL ADULT WITH MESH;  Surgeon: Aviva Signs, MD;  Location: [facility];  Service: General;  Laterality: Right;  patient knows to arrive at 7:30  . IR TRANSCATHETER BX  10/14/2017  . IR US GUIDE VASC ACCESS RIGHT  10/14/2017  . IR VENOGRAM HEPATIC WO HEMODYNAMIC EVALUATION  10/14/2017  . POLYPECTOMY  03/06/2017   Procedure: POLYPECTOMY;  Surgeon: Daneil Dolin, MD;  Location: [facility];  Service: Endoscopy;;  ascending colon;    Family History  Problem Relation Age of Onset  . COPD Mother   . Liver disease Neg Hx   . Colon cancer Neg Hx   . Colon polyps Neg Hx     Current Outpatient Medications on File  Prior to Visit  Medication Sig Dispense Refill  . atorvastatin (LIPITOR) 10 MG tablet Take 1 tablet (10 mg total) by mouth daily. 30 tablet 11  . dexlansoprazole (DEXILANT) 60 MG capsule Take 1 capsule (60 mg total) by mouth daily. 90 capsule 3  . furosemide (LASIX) 40 MG tablet Take 1 tablet (40 mg total) by mouth daily. 30 tablet 5  . nadolol (CORGARD) 40 MG tablet Take 1 tablet (40 mg total) by mouth daily. And 1/2 tablet in evening (Patient taking differently: Take 20-40 mg by mouth 2 (two) times daily. ) 60 tablet 3  . spironolactone (ALDACTONE) 50 MG tablet Take 1 tablet po bid 60 tablet 0  . Vitamin D, Ergocalciferol, (DRISDOL) 50000 units CAPS capsule Take 1 capsule (50,000 Units total) by mouth every 7 (seven) days. 12 capsule 0   No current facility-administered medications on file prior to visit.     No Known Allergies  Social History   Substance and Sexual Activity  Alcohol Use No   Comment: Quit September 2017, heavy alcohol abuse previously    Social History   Tobacco Use  Smoking Status Never Smoker  Smokeless Tobacco Never Used    Review of Systems  Constitutional: Positive for malaise/fatigue.  HENT: Negative.   Eyes: Negative.   Respiratory: Negative.   Cardiovascular: Negative.   Gastrointestinal: Positive for abdominal pain.  Genitourinary: Negative.   Musculoskeletal: Negative.  Skin: Negative.   Endo/Heme/Allergies: Negative.   Psychiatric/Behavioral: Negative.     Objective   Vitals:   07/23/18 1011  BP: (!) 153/73  Pulse: 80  Resp: 20  Temp: 98.9 F (37.2 C)    Physical Exam Vitals signs reviewed.  Constitutional:      Appearance: Normal appearance. He is not ill-appearing.  HENT:     Head: Normocephalic and atraumatic.  Cardiovascular:     Rate and Rhythm: Normal rate. Rhythm irregular.     Heart sounds: Normal heart sounds. No murmur. No friction rub. No gallop.   Pulmonary:     Effort: Pulmonary effort is normal. No  respiratory distress.     Breath sounds: Normal breath sounds. No stridor. No wheezing, rhonchi or rales.  Abdominal:     General: Abdomen is flat. Bowel sounds are normal. There is no distension.     Palpations: Abdomen is soft. There is no mass.     Tenderness: There is abdominal tenderness. There is no guarding or rebound.     Hernia: No hernia is present.     Comments: Variable tenderness in the right groin region deep to the surgical incision site.  No discrete hernia appreciated.  Incision well-healed.  No hernia appreciated.  Patient does not have ascites at this time.  Musculoskeletal:     Comments: Swollen right lower extremity noted.  Patient has had this for some time.  Skin:    General: Skin is warm and dry.  Neurological:     Mental Status: He is alert and oriented to person, place, and time.     Assessment  Right groin pain secondary to muscular strain, less likely neuralgia.  Cirrhosis, well compensated at the present time Plan   I told the patient that there are limited options for him.  He understands that the the pain has not worsened and may improve on its own.  This would be true if it is just muscular in nature.  Given his history of cirrhosis, nonsteroidal anti-inflammatory use options are limited.  I did tell the patient to avoid exerting any strain in the right groin region.  Should this become more problematic, I told patient to return my office for further evaluation.  He understands and agrees.

## 2018-07-29 ENCOUNTER — Telehealth: Payer: Self-pay

## 2018-07-29 NOTE — Telephone Encounter (Signed)
[facility] - Inactive   Patient declined [facility] services.  Per [facility] Report patient has a PHQ score of 13.  Writer routed this information to the PCP and Dr. Modesta Messing.

## 2018-08-05 ENCOUNTER — Other Ambulatory Visit: Payer: Self-pay

## 2018-08-05 ENCOUNTER — Emergency Department (HOSPITAL_COMMUNITY)
Admission: EM | Admit: 2018-08-05 | Discharge: 2018-08-06 | Disposition: A | Discharge status: Home / Self Care | Payer: Self-pay | Attending: Emergency Medicine | Admitting: Emergency Medicine

## 2018-08-05 ENCOUNTER — Encounter (HOSPITAL_COMMUNITY): Payer: Self-pay | Admitting: Emergency Medicine

## 2018-08-05 ENCOUNTER — Emergency Department (HOSPITAL_COMMUNITY): Payer: Self-pay

## 2018-08-05 DIAGNOSIS — R42 Dizziness and giddiness: Secondary | ICD-10-CM

## 2018-08-05 DIAGNOSIS — E119 Type 2 diabetes mellitus without complications: Secondary | ICD-10-CM | POA: Insufficient documentation

## 2018-08-05 DIAGNOSIS — I10 Essential (primary) hypertension: Secondary | ICD-10-CM | POA: Insufficient documentation

## 2018-08-05 DIAGNOSIS — R51 Headache: Secondary | ICD-10-CM | POA: Insufficient documentation

## 2018-08-05 DIAGNOSIS — Z79899 Other long term (current) drug therapy: Secondary | ICD-10-CM | POA: Insufficient documentation

## 2018-08-05 DIAGNOSIS — R519 Headache, unspecified: Secondary | ICD-10-CM

## 2018-08-05 LAB — CBC WITH DIFFERENTIAL/PLATELET
Abs Immature Granulocytes: 0.02 10*3/uL (ref 0.00–0.07)
Basophils Absolute: 0 10*3/uL (ref 0.0–0.1)
Basophils Relative: 0 %
Eosinophils Absolute: 0.1 10*3/uL (ref 0.0–0.5)
Eosinophils Relative: 1 %
HCT: 43.5 % (ref 39.0–52.0)
Hemoglobin: 13.7 g/dL (ref 13.0–17.0)
Immature Granulocytes: 0 %
Lymphocytes Relative: 39 %
Lymphs Abs: 2.7 10*3/uL (ref 0.7–4.0)
MCH: 28.4 pg (ref 26.0–34.0)
MCHC: 31.5 g/dL (ref 30.0–36.0)
MCV: 90.1 fL (ref 80.0–100.0)
Monocytes Absolute: 0.4 10*3/uL (ref 0.1–1.0)
Monocytes Relative: 5 %
Neutro Abs: 3.8 10*3/uL (ref 1.7–7.7)
Neutrophils Relative %: 55 %
Platelets: 251 10*3/uL (ref 150–400)
RBC: 4.83 MIL/uL (ref 4.22–5.81)
RDW: 13.6 % (ref 11.5–15.5)
WBC: 7 10*3/uL (ref 4.0–10.5)
nRBC: 0 % (ref 0.0–0.2)

## 2018-08-05 LAB — URINALYSIS, ROUTINE W REFLEX MICROSCOPIC
Bilirubin Urine: NEGATIVE
Glucose, UA: NEGATIVE mg/dL
Hgb urine dipstick: NEGATIVE
Ketones, ur: NEGATIVE mg/dL
Leukocytes, UA: NEGATIVE
Nitrite: NEGATIVE
Protein, ur: NEGATIVE mg/dL
Specific Gravity, Urine: 1.004 — ABNORMAL LOW (ref 1.005–1.030)
pH: 5 (ref 5.0–8.0)

## 2018-08-05 LAB — BASIC METABOLIC PANEL
Anion gap: 7 (ref 5–15)
BUN: 9 mg/dL (ref 6–20)
CO2: 26 mmol/L (ref 22–32)
Calcium: 9.8 mg/dL (ref 8.9–10.3)
Chloride: 102 mmol/L (ref 98–111)
Creatinine, Ser: 0.72 mg/dL (ref 0.61–1.24)
GFR calc Af Amer: >60 mL/min (ref >60)
GFR calc non Af Amer: >60 mL/min (ref >60)
Glucose, Bld: 149 mg/dL — ABNORMAL HIGH (ref 70–99)
Potassium: 3.9 mmol/L (ref 3.5–5.1)
Sodium: 135 mmol/L (ref 135–145)

## 2018-08-05 LAB — CBG MONITORING, ED: Glucose-Capillary: 178 mg/dL — ABNORMAL HIGH (ref 70–99)

## 2018-08-05 MED ORDER — DIPHENHYDRAMINE HCL 50 MG/ML IJ SOLN
25.0000 mg | Freq: Once | INTRAMUSCULAR | Status: AC
  Administered 2018-08-05: 25 mg via INTRAVENOUS
  Filled 2018-08-05: qty 1

## 2018-08-05 MED ORDER — SODIUM CHLORIDE 0.9 % IV SOLN
INTRAVENOUS | Status: DC
  Administered 2018-08-05: 22:00:00 via INTRAVENOUS

## 2018-08-05 MED ORDER — KETOROLAC TROMETHAMINE 30 MG/ML IJ SOLN
15.0000 mg | Freq: Once | INTRAMUSCULAR | Status: AC
  Administered 2018-08-05: 15 mg via INTRAVENOUS
  Filled 2018-08-05: qty 1

## 2018-08-05 MED ORDER — METOCLOPRAMIDE HCL 5 MG/ML IJ SOLN
10.0000 mg | Freq: Once | INTRAMUSCULAR | Status: AC
  Administered 2018-08-05: 10 mg via INTRAVENOUS
  Filled 2018-08-05: qty 2

## 2018-08-05 NOTE — ED Triage Notes (Signed)
Patient reports generally not feeling well with dizziness and nausea. No emesis.

## 2018-08-05 NOTE — ED Provider Notes (Signed)
[facility] Provider Note   CSN: [id number] Arrival date & time: 08/05/18  2229     History   Chief Complaint Chief Complaint  Patient presents with  . Dizziness    HPI Thomas Pratt is a 58 y.o. male.  HPI   Thomas Pratt with dizziness. Vague sensation that he just doesn't feel right. Nausea. Headache. Denies trauma. No fever. No neck pain/stiffness. No acute visual changes. No photophobia. No acute numbness, tingling, focal loss of strength or acute balance problems. Doesn't feel like room spinning. Doesn't get headaches often. No CP or dyspnea.   Past Medical History:  Diagnosis Date  . Cirrhosis ([place])    likely ETOH use     Patient Active Problem List   Diagnosis Date Noted  . History of ETOH abuse 02/25/2018  . Edema 12/05/2017  . Abdominal pain 11/05/2017  . Primary biliary cholangitis ([place]) 11/05/2017  . Type 2 diabetes mellitus with other specified complication ([place]) 79/89/2119  . Abnormal LFTs 09/16/2017  . Calculus of gallbladder without cholecystitis without obstruction   . Dyspepsia 07/31/2017  . Inguinal hernia 11/14/2016  . Lower extremity edema   . Cirrhosis ([place]) 05/21/2016  . Hypoalbuminemia 05/21/2016  . Hyponatremia 05/21/2016  . Hypokalemia 05/21/2016  . HTN (hypertension) 05/21/2016  . Ascites 05/21/2016  . ALC (alcoholic liver cirrhosis) ([place]) 05/21/2016    Past Surgical History:  Procedure Laterality Date  . CHOLECYSTECTOMY N/A 09/01/2017   Procedure: LAPAROSCOPIC CHOLECYSTECTOMY;  Surgeon: Aviva Signs, MD;  Location: [facility];  Service: General;  Laterality: N/A;  . COLONOSCOPY WITH PROPOFOL N/A 03/06/2017   Dr. Gala Romney: tubular adenoma removed, next TCS 5 years if health permits.   . ESOPHAGOGASTRODUODENOSCOPY (EGD) WITH PROPOFOL N/A 03/06/2017   Dr. Gala Romney: grade 1 esophageal varices, erosive esophagitis, portal gastropathy.   Marland Kitchen HERNIA REPAIR Right    RIH  . INGUINAL HERNIA REPAIR Right 07/11/2017   Procedure: HERNIA  REPAIR INGUINAL ADULT WITH MESH;  Surgeon: Aviva Signs, MD;  Location: [facility];  Service: General;  Laterality: Right;  patient knows to arrive at 7:30  . IR TRANSCATHETER BX  10/14/2017  . IR US GUIDE VASC ACCESS RIGHT  10/14/2017  . IR VENOGRAM HEPATIC WO HEMODYNAMIC EVALUATION  10/14/2017  . POLYPECTOMY  03/06/2017   Procedure: POLYPECTOMY;  Surgeon: Daneil Dolin, MD;  [facility];  Service: Endoscopy;;  ascending colon;        Home Medications    Prior to Admission medications   Medication Sig Start Date End Date Taking? Authorizing Provider  atorvastatin (LIPITOR) 10 MG tablet Take 1 tablet (10 mg total) by mouth daily. Patient taking differently: Take 10 mg by mouth every morning.  05/19/18 05/19/19 Yes Hawks, Christy A, FNP  furosemide (LASIX) 40 MG tablet Take 1 tablet (40 mg total) by mouth daily. 08/22/17  Yes Mahala Menghini, PA-C  spironolactone (ALDACTONE) 50 MG tablet Take 1 tablet po bid Patient taking differently: Take 50 mg by mouth every morning.  04/03/18  Yes Caren Macadam, MD  Vitamin D, Ergocalciferol, (DRISDOL) 50000 units CAPS capsule Take 1 capsule (50,000 Units total) by mouth every 7 (seven) days. Patient taking differently: Take 50,000 Units by mouth every Monday.  10/30/17  Yes Hagler, Apolonio Schneiders, MD  dexlansoprazole (DEXILANT) 60 MG capsule Take 1 capsule (60 mg total) by mouth daily. Patient not taking: Reported on 08/05/2018 07/03/18   Annitta Needs, NP    Family History Family History  Problem Relation Age of Onset  .  COPD Mother   . Liver disease Neg Hx   . Colon cancer Neg Hx   . Colon polyps Neg Hx     Social History Social History   Tobacco Use  . Smoking status: Never Smoker  . Smokeless tobacco: Never Used  Substance Use Topics  . Alcohol use: No    Comment: Quit September 2017, heavy alcohol abuse previously  . Drug use: No     Allergies   Patient has no known allergies.   Review of Systems Review of Systems All systems  reviewed and negative, other than as noted in HPI.   Physical Exam Updated Vital Signs BP 121/75   Pulse 70   Temp 98 F (36.7 C) (Oral)   Resp 19   Ht 5\' 8"  (1.727 m)   Wt 84.4 kg   SpO2 99%   BMI 28.28 kg/m   Physical Exam Vitals signs and nursing note reviewed.  Constitutional:      General: He is not in acute distress.    Appearance: He is well-developed.  HENT:     Head: Normocephalic and atraumatic.  Eyes:     General:        Right eye: No discharge.        Left eye: No discharge.     Conjunctiva/sclera: Conjunctivae normal.     Pupils: Pupils are equal, round, and reactive to light.  Neck:     Musculoskeletal: Neck supple. No neck rigidity.  Cardiovascular:     Rate and Rhythm: Normal rate and regular rhythm.     Heart sounds: Normal heart sounds. No murmur. No friction rub. No gallop.   Pulmonary:     Effort: Pulmonary effort is normal. No respiratory distress.     Breath sounds: Normal breath sounds.  Abdominal:     General: There is no distension.     Palpations: Abdomen is soft.     Tenderness: There is no abdominal tenderness.  Musculoskeletal:        General: No tenderness.  Skin:    General: Skin is warm and dry.  Neurological:     General: No focal deficit present.     Mental Status: He is alert and oriented to person, place, and time.     Cranial Nerves: No cranial nerve deficit.     Motor: No weakness.     Coordination: Coordination normal.  Psychiatric:        Mood and Affect: Mood normal.      ED Treatments / Results  Labs (all labs ordered are listed, but only abnormal results are displayed) Labs Reviewed  BASIC METABOLIC PANEL - Abnormal; Notable for the following components:      Result Value   Glucose, Bld 149 (*)    All other components within normal limits  URINALYSIS, ROUTINE W REFLEX MICROSCOPIC - Abnormal; Notable for the following components:   Color, Urine STRAW (*)    Specific Gravity, Urine 1.004 (*)    All other  components within normal limits  CBG MONITORING, ED - Abnormal; Notable for the following components:   Glucose-Capillary 178 (*)    All other components within normal limits  CBC WITH DIFFERENTIAL/PLATELET    EKG EKG Interpretation  Date/Time:  Wednesday August 05 2018 20:51:37 EST Ventricular Rate:  69 PR Interval:  142 QRS Duration: 87 QT Interval:  404 QTC Calculation: 433 R Axis:   -9 Text Interpretation:  Sinus rhythm Abnormal R-wave progression, early transition Left ventricular hypertrophy Confirmed by Wilson Singer,  Annie Main [phone]) on 08/05/2018 9:24:55 PM   Radiology No results found.   Ct Head Wo Contrast  Result Date: 08/05/2018 CLINICAL DATA:  Dizziness, nausea EXAM: CT HEAD WITHOUT CONTRAST TECHNIQUE: Contiguous axial images were obtained from the base of the skull through the vertex without intravenous contrast. COMPARISON:  04/17/2018 FINDINGS: Brain: No evidence of acute infarction, hemorrhage, hydrocephalus, extra-axial collection or mass lesion/mass effect. Mild cortical atrophy. Encephalomalacic changes in the left parietal region (series 2/image 20) and left cerebellum. Vascular: Intracranial atherosclerosis. Skull: Normal. Negative for fracture or focal lesion. Sinuses/Orbits: The visualized paranasal sinuses are essentially clear. The mastoid air cells are unopacified. Other: None. IMPRESSION: No evidence of acute intracranial abnormality. Encephalomalacic changes related to prior left parietal and cerebellar infarcts. Electronically Signed   By: Julian Hy M.D.   On: 08/05/2018 23:05     Procedures Procedures (including critical care time)  Medications Ordered in ED Medications - No data to display   Initial Impression / Assessment and Plan / ED Course  I have reviewed the triage vital signs and the nursing notes.  Pertinent labs & imaging results that were available during my care of the patient were reviewed by me and considered in my medical decision  making (see chart for details).     Thomas Pratt with vague sensation of dizziness. No syncope. Does change with exertion. Doesn't describe vertigo. Unclear etiology. Looks well. HD stable. Afebrile. Neuro baseline. ED w/u unrevealing including CT head. Feels much better after meds.   It has been determined that no acute conditions requiring further emergency intervention are present at this time. The patient has been advised of the diagnosis and plan. I reviewed any labs and imaging including any potential incidental findings. I have reviewed nursing notes and appropriate previous records. We have discussed signs and symptoms that warrant return to the ED and they are listed in the discharge instructions.      Final Clinical Impressions(s) / ED Diagnoses   Final diagnoses:  Acute nonintractable headache, unspecified headache type  Dizziness    ED Discharge Orders    None       Virgel Manifold, MD 08/15/18 [phone]

## 2018-08-10 ENCOUNTER — Encounter: Payer: Self-pay | Admitting: Family Medicine

## 2018-08-10 ENCOUNTER — Ambulatory Visit (INDEPENDENT_AMBULATORY_CARE_PROVIDER_SITE_OTHER): Payer: Medicaid Other | Admitting: Family Medicine

## 2018-08-10 VITALS — BP 121/68 | HR 78 | Temp 97.8°F | Ht 68.0 in | Wt 183.2 lb

## 2018-08-10 DIAGNOSIS — M62830 Muscle spasm of back: Secondary | ICD-10-CM

## 2018-08-10 MED ORDER — TIZANIDINE HCL 6 MG PO CAPS: 6.0000 mg | ORAL_CAPSULE | Freq: Three times a day (TID) | ORAL | 1 refills | Status: DC | PRN

## 2018-08-10 MED ORDER — PREDNISONE 10 MG PO TABS: ORAL_TABLET | ORAL | 0 refills | Status: DC

## 2018-08-10 NOTE — Progress Notes (Signed)
Subjective:  Patient ID: Thomas Pratt, male    DOB: 25-Apr-1961  Age: 58 y.o. MRN: [id number]  CC: Back Pain (pt here today c/o severe back pain and headache which he was seen at [facility] on 08/05/18)   HPI Thomas Pratt presents for follow-up of his lumbar pain.  He was seen 5 days ago in the [facility] emergency department.  His pain has been present for couple of days at that time.  He was given 3 different shots.  1 of them was for nausea that helped.  The other 2 were for the pain and they have not helped.  Pain has been moderately severe.  It is interfering with his ability to bend and lift.  There is no known injury.  Depression screen [facility] 2/9 08/10/2018 06/24/2018 05/18/2018  Decreased Interest 1 3 0  Down, Depressed, Hopeless 2 0 0  PHQ - 2 Score 3 3 0  Altered sleeping 2 3 -  Tired, decreased energy 3 3 -  Change in appetite 1 0 -  Feeling bad or failure about yourself  0 0 -  Trouble concentrating 2 2 -  Moving slowly or fidgety/restless 3 2 -  Suicidal thoughts 0 0 -  PHQ-9 Score 14 13 -  Difficult doing work/chores - - -    History Thomas Pratt has a past medical history of Cirrhosis ([place]).   He has a past surgical history that includes Colonoscopy with propofol (N/A, 03/06/2017); Esophagogastroduodenoscopy (egd) with propofol (N/A, 03/06/2017); polypectomy (03/06/2017); Inguinal hernia repair (Right, 07/11/2017); Hernia repair (Right); Cholecystectomy (N/A, 09/01/2017); IR Transcatheter BX (10/14/2017); IR US Guide Vasc Access Right (10/14/2017); and IR Venogram Hepatic Wo Hemodynamic Evaluation (10/14/2017).   His family history includes COPD in his mother.He reports that he has never smoked. He has never used smokeless tobacco. He reports that he does not drink alcohol or use drugs.    ROS Review of Systems  Constitutional: Negative for fever.  Respiratory: Negative for shortness of breath.   Cardiovascular: Negative for chest pain.  Gastrointestinal: Positive for  abdominal distention.  Musculoskeletal: Negative for arthralgias.  Skin: Negative for rash.    Objective:  BP 121/68   Pulse 78   Temp 97.8 F (36.6 C) (Oral)   Ht 5\' 8"  (1.727 m)   Wt 183 lb 4 oz (83.1 kg)   BMI 27.86 kg/m   BP Readings from Last 3 Encounters:  08/10/18 121/68  08/05/18 113/69  07/23/18 (!) 153/73    Wt Readings from Last 3 Encounters:  08/10/18 183 lb 4 oz (83.1 kg)  08/05/18 186 lb (84.4 kg)  07/23/18 186 lb 9.6 oz (84.6 kg)     Physical Exam Constitutional:      General: He is not in acute distress.    Appearance: He is well-developed.  HENT:     Head: Normocephalic and atraumatic.     Right Ear: External ear normal.     Left Ear: External ear normal.     Nose: Nose normal.  Eyes:     Conjunctiva/sclera: Conjunctivae normal.     Pupils: Pupils are equal, round, and reactive to light.  Neck:     Musculoskeletal: Normal range of motion and neck supple.  Cardiovascular:     Rate and Rhythm: Normal rate and regular rhythm.     Heart sounds: Normal heart sounds. No murmur.  Pulmonary:     Effort: Pulmonary effort is normal. No respiratory distress.     Breath sounds: Normal breath sounds.  No wheezing or rales.  Abdominal:     Palpations: Abdomen is soft.     Tenderness: There is no abdominal tenderness.  Musculoskeletal: Normal range of motion.        General: Tenderness (With palpable spasm at the L4-L5 lumbar spinous musculature bilaterally.  Positive straight leg raise left greater than right) present. No swelling.     Right lower leg: No edema.     Left lower leg: No edema.  Skin:    General: Skin is warm and dry.  Neurological:     Mental Status: He is alert and oriented to person, place, and time.     Deep Tendon Reflexes: Reflexes are normal and symmetric.  Psychiatric:        Behavior: Behavior normal.        Thought Content: Thought content normal.        Judgment: Judgment normal.       Assessment & Plan:   Thomas Pratt was  seen today for back pain.  Diagnoses and all orders for this visit:  Lumbar paraspinal muscle spasm  Other orders -     tizanidine (ZANAFLEX) 6 MG capsule; Take 1 capsule (6 mg total) by mouth 3 (three) times daily as needed for muscle spasms. -     predniSONE (DELTASONE) 10 MG tablet; Take 5 daily for 3 days followed by 4,3,2 and 1 for 3 days each.       I am having [address] Thomas Pratt "Thomas Pratt" start on tizanidine and predniSONE. I am also having him maintain his furosemide, Vitamin D (Ergocalciferol), spironolactone, atorvastatin, and dexlansoprazole.  Allergies as of 08/10/2018   No Known Allergies     Medication List       Accurate as of August 10, 2018  2:11 PM. Always use your most recent med list.        atorvastatin 10 MG tablet Commonly known as:  LIPITOR Take 1 tablet (10 mg total) by mouth daily.   dexlansoprazole 60 MG capsule Commonly known as:  DEXILANT Take 1 capsule (60 mg total) by mouth daily.   furosemide 40 MG tablet Commonly known as:  LASIX Take 1 tablet (40 mg total) by mouth daily.   predniSONE 10 MG tablet Commonly known as:  DELTASONE Take 5 daily for 3 days followed by 4,3,2 and 1 for 3 days each.   spironolactone 50 MG tablet Commonly known as:  ALDACTONE Take 1 tablet po bid   tizanidine 6 MG capsule Commonly known as:  ZANAFLEX Take 1 capsule (6 mg total) by mouth 3 (three) times daily as needed for muscle spasms.   Vitamin D (Ergocalciferol) 1.25 MG (50000 UT) Caps capsule Commonly known as:  DRISDOL Take 1 capsule (50,000 Units total) by mouth every 7 (seven) days.        Follow-up: Return in about 2 weeks (around 08/24/2018), or if symptoms worsen or fail to improve with [facility] for follow-up, cirrhosis.  Claretta Fraise, M.D.

## 2018-08-10 NOTE — Patient Instructions (Signed)

## 2018-08-11 ENCOUNTER — Other Ambulatory Visit: Payer: Self-pay

## 2018-08-11 DIAGNOSIS — D649 Anemia, unspecified: Secondary | ICD-10-CM

## 2018-08-20 ENCOUNTER — Ambulatory Visit (INDEPENDENT_AMBULATORY_CARE_PROVIDER_SITE_OTHER): Payer: Self-pay | Admitting: Family

## 2018-08-20 ENCOUNTER — Encounter: Payer: Self-pay | Admitting: Family

## 2018-08-20 VITALS — BP 133/77 | HR 81 | Temp 97.7°F | Ht 68.0 in | Wt 178.6 lb

## 2018-08-20 DIAGNOSIS — R1031 Right lower quadrant pain: Secondary | ICD-10-CM

## 2018-08-20 DIAGNOSIS — R1084 Generalized abdominal pain: Secondary | ICD-10-CM

## 2018-08-20 DIAGNOSIS — F1011 Alcohol abuse, in remission: Secondary | ICD-10-CM

## 2018-08-20 DIAGNOSIS — M545 Low back pain, unspecified: Secondary | ICD-10-CM

## 2018-08-20 DIAGNOSIS — K7460 Unspecified cirrhosis of liver: Secondary | ICD-10-CM

## 2018-08-20 LAB — CBC WITH DIFFERENTIAL/PLATELET
Basophils Absolute: 0 10*3/uL (ref 0.0–0.2)
Basos: 0 %
EOS (ABSOLUTE): 0.1 10*3/uL (ref 0.0–0.4)
Eos: 1 %
Hematocrit: 42.3 % (ref 37.5–51.0)
Hemoglobin: 13.5 g/dL (ref 13.0–17.7)
Immature Grans (Abs): 0 10*3/uL (ref 0.0–0.1)
Immature Granulocytes: 0 %
LYMPHS ABS: 3.4 10*3/uL — AB (ref 0.7–3.1)
Lymphs: 33 %
MCH: 28.7 pg (ref 26.6–33.0)
MCHC: 31.9 g/dL (ref 31.5–35.7)
MCV: 90 fL (ref 79–97)
MONOCYTES: 6 %
Monocytes Absolute: 0.6 10*3/uL (ref 0.1–0.9)
Neutrophils Absolute: 6.1 10*3/uL (ref 1.4–7.0)
Neutrophils: 60 %
Platelets: 226 10*3/uL (ref 150–450)
RBC: 4.71 x10E6/uL (ref 4.14–5.80)
RDW: 12.7 % (ref 11.6–15.4)
WBC: 10.3 10*3/uL (ref 3.4–10.8)

## 2018-08-20 NOTE — Patient Instructions (Signed)
Cirrhosis    Cirrhosis is long-term (chronic) liver injury. The liver is the body's largest internal organ, and it performs many functions. It converts food into energy, removes toxic material from the blood, makes important proteins, and absorbs necessary vitamins from food.  In cirrhosis, healthy liver cells are replaced by scar tissue. This prevents blood from flowing through the liver, making it difficult for the liver to function. Scarring of the liver cannot be reversed, but treatment can prevent it from getting worse.  What are the causes?  Common causes of this condition are hepatitis C and long-term alcohol abuse. Other causes include:   Nonalcoholic fatty liver disease. This happens when fat is deposited in the liver by causes other than alcohol.   Hepatitis B infection.   Autoimmune hepatitis. In this condition, the body's defense system (immune system) mistakenly attacks the liver cells, causing irritation and swelling (inflammation).   Diseases that cause blockage of ducts inside the liver.   Inherited liver diseases, such as hemochromatosis. This is one of the most common inherited liver diseases. In this disease, deposits of iron collect in the liver and other organs.   Reactions to certain long-term medicines, such as amiodarone, a heart medicine.   Parasitic infections. These include schistosomiasis, which is caused by a flatworm.   Long-term contact to certain toxins. These toxins include certain organic solvents, such as toluene and chloroform.  What increases the risk?  You are more likely to develop this condition if:   You have certain types of viral hepatitis.   You abuse alcohol, especially if you are male.   You are overweight.   You share needles.   You have unprotected sex with someone who has viral hepatitis.  What are the signs or symptoms?  You may not have any signs and symptoms at first. Symptoms may not develop until the damage to your liver starts to get worse.  Early  symptoms may include:   Weakness and tiredness (fatigue).   Changes in sleep patterns or having trouble sleeping.   Itchiness.   Tenderness in the right-upper part of your abdomen.   Weight loss and muscle loss.   Nausea.   Loss of appetite.   Appearance of tiny blood vessels under the skin.  Later symptoms may include:   Fatigue or weakness that is getting worse.   Yellow skin and eyes (jaundice).   Buildup of fluid in the abdomen (ascites). You may notice that your clothes are tight around your waist.   Weight gain.   Swelling of the feet and ankles (edema).   Trouble breathing.   Easy bruising and bleeding.   Vomiting blood.   Black or bloody stool.   Mental confusion.  How is this diagnosed?  Your health care provider may suspect cirrhosis based on your symptoms and medical history, especially if you have other medical conditions or a history of alcohol abuse. Your health care provider will do a physical exam to feel your liver and to check for signs of cirrhosis. He or she may perform other tests, including:   Blood tests to check:  ? For hepatitis B or C.  ? Kidney function.  ? Liver function.   Imaging tests such as:  ? MRI or CT scan to look for changes seen in advanced cirrhosis.  ? Ultrasound to see if normal liver tissue is being replaced by scar tissue.   A procedure in which a long needle is used to take a sample of liver   tissue to be checked in a lab (biopsy). Liver biopsy can confirm the diagnosis of cirrhosis.  How is this treated?  Treatment for this condition depends on how damaged your liver is and what caused the damage. It may include treating the symptoms of cirrhosis, or treating the underlying causes in order to slow the damage. Treatment may include:   Making lifestyle changes, such as:  ? Eating a healthy diet. You may need to work with your health care provider or a diet and nutrition specialist (dietitian) to develop an eating plan.  ? Restricting salt  intake.  ? Maintaining a healthy weight.  ? Not abusing drugs or alcohol.   Taking medicines to:  ? Treat liver infections or other infections.  ? Control itching.  ? Reduce fluid buildup.  ? Reduce certain blood toxins.  ? Reduce risk of bleeding from enlarged blood vessels in the stomach or esophagus (varices).   Liver transplant. In this procedure, a liver from a donor is used to replace your diseased liver. This is done if cirrhosis has caused liver failure.  Other treatments and procedures may be done depending on the problems that you get from cirrhosis. Common problems include liver-related kidney failure (hepatorenal syndrome).  Follow these instructions at home:     Take medicines only as told by your health care provider. Do not use medicines that are toxic to your liver. Ask your health care provider before taking any new medicines, including over-the-counter medicines.   Rest as needed.   Eat a well-balanced diet. Ask your health care provider or dietitian for more information.   Limit your salt or water intake, if your health care provider asks you to do this.   Do not drink alcohol. This is especially important if you are taking acetaminophen.   Keep all follow-up visits as told by your health care provider. This is important.  Contact a health care provider if you:   Have fatigue or weakness that is getting worse.   Develop swelling of the hands, feet, legs, or face.   Have a fever.   Develop loss of appetite.   Have nausea or vomiting.   Develop jaundice.   Develop easy bruising or bleeding.  Get help right away if you:   Vomit bright red blood or a material that looks like coffee grounds.   Have blood in your stools.   Notice that your stools appear black and tarry.   Become confused.   Have chest pain or trouble breathing.  Summary   Cirrhosis is chronic liver injury. Liver damage cannot be reversed. Common causes are hepatitis C and long-term alcohol abuse.   Tests used to  diagnose cirrhosis include blood tests, imaging tests, and liver biopsy.   Treatment for this condition involves treating the underlying cause. Avoid alcohol, drugs, salt, and medicines that may damage your liver.   Contact your health care provider if you develop ascites, edema, jaundice, fever, nausea or vomiting, easy bruising or bleeding, or worsening fatigue.  This information is not intended to replace advice given to you by your health care provider. Make sure you discuss any questions you have with your health care provider.  Document Released: 07/08/2005 Document Revised: 05/28/2017 Document Reviewed: 05/28/2017  Elsevier Interactive Patient Education  2019 [employer]

## 2018-08-20 NOTE — Progress Notes (Signed)
Subjective:    Patient ID: Thomas Pratt, male    DOB: September 06, 1960, 58 y.o.   MRN: [id number]  Chief Complaint  Patient presents with  . recheck cirrhosis of liver    stomach and back pain, one week recheck   Pt presents to the office today to recheck abdominal pain. He went to the ED on 08/05/18 with dizziness and headache. CT head was negative.   He was then seen on 08/10/18 in our office for severe back pain and headache. He was given prednisone and zanaflex. He states his back pain is greatly improved.   He has cirrhosis. He quit drinking alcohol 2 and 1/2 years. He is followed by GI every 6 months.  Abdominal Pain  This is a chronic problem. The problem occurs intermittently. The pain is located in the epigastric region. The pain is at a severity of 9/10. The pain is moderate. The quality of the pain is cramping. Associated symptoms include headaches and nausea. Pertinent negatives include no belching, constipation, diarrhea, hematochezia, hematuria or vomiting. Nothing aggravates the pain. The pain is relieved by nothing. The treatment provided no relief. Prior diagnostic workup includes GI consult.      Review of Systems  Gastrointestinal: Positive for abdominal pain and nausea. Negative for constipation, diarrhea, hematochezia and vomiting.  Genitourinary: Negative for hematuria.  Neurological: Positive for headaches.  All other systems reviewed and are negative.      Objective:   Physical Exam Vitals signs reviewed.  Constitutional:      General: He is not in acute distress.    Appearance: He is well-developed.  HENT:     Head: Normocephalic.     Right Ear: Tympanic membrane normal.     Left Ear: Tympanic membrane normal.  Eyes:     General:        Right eye: No discharge.        Left eye: No discharge.     Pupils: Pupils are equal, round, and reactive to light.  Neck:     Musculoskeletal: Normal range of motion and neck supple.     Thyroid: No thyromegaly.   Cardiovascular:     Rate and Rhythm: Normal rate and regular rhythm.     Heart sounds: Normal heart sounds. No murmur.  Pulmonary:     Effort: Pulmonary effort is normal. No respiratory distress.     Breath sounds: Normal breath sounds. No wheezing.  Abdominal:     General: Bowel sounds are normal. There is no distension.     Palpations: Abdomen is soft.     Tenderness: There is abdominal tenderness (LLQ).  Musculoskeletal: Normal range of motion.        General: No tenderness.  Skin:    General: Skin is warm and dry.     Findings: No erythema or rash.  Neurological:     Mental Status: He is alert and oriented to person, place, and time.     Cranial Nerves: No cranial nerve deficit.     Deep Tendon Reflexes: Reflexes are normal and symmetric.  Psychiatric:        Behavior: Behavior normal.        Thought Content: Thought content normal.        Judgment: Judgment normal.       BP 133/77   Pulse 81   Temp 97.7 F (36.5 C) (Oral)   Ht 5\' 8"  (1.727 m)   Wt 178 lb 9.6 oz (81 kg)   BMI 27.16 kg/m  Assessment & Plan:  [name] comes in today with chief complaint of recheck cirrhosis of liver (stomach and back pain, one week recheck)   Diagnosis and orders addressed:  1. Cirrhosis of liver without ascites, unspecified hepatic cirrhosis type (HCC) - CBC with Differential/Platelet  2. Generalized abdominal pain - CBC with Differential/Platelet  3. History of ETOH abuse - CBC with Differential/Platelet  4. Right lower quadrant abdominal pain - CBC with Differential/Platelet  5. Bilateral low back pain without sciatica, unspecified chronicity    CBC pending Continue prednisone since back pain is improved Keep appt with GI and liver specialists Continue to sustain from alcohol  RTO as needed and keep chronic follow up  Evelina Dun, FNP

## 2018-08-21 ENCOUNTER — Telehealth: Payer: Self-pay

## 2018-08-21 NOTE — Telephone Encounter (Signed)
Patient aware.

## 2018-08-21 NOTE — Telephone Encounter (Signed)
Force fluids and make sure he stays hydrated. Vital signs and labs were all stable yesterday.

## 2018-08-21 NOTE — Telephone Encounter (Signed)
Patient states that after taking prednisone last night he feels very weak and his mouth is dry. He is concerned. Wanted you to know

## 2018-08-28 ENCOUNTER — Encounter: Payer: Self-pay | Admitting: Nurse Practitioner

## 2018-08-28 ENCOUNTER — Ambulatory Visit (INDEPENDENT_AMBULATORY_CARE_PROVIDER_SITE_OTHER): Payer: Self-pay | Admitting: Nurse Practitioner

## 2018-08-28 VITALS — BP 89/59 | HR 72 | Temp 96.6°F | Ht 68.0 in | Wt 173.2 lb

## 2018-08-28 DIAGNOSIS — R600 Localized edema: Secondary | ICD-10-CM

## 2018-08-28 DIAGNOSIS — R1084 Generalized abdominal pain: Secondary | ICD-10-CM

## 2018-08-28 DIAGNOSIS — K7460 Unspecified cirrhosis of liver: Secondary | ICD-10-CM

## 2018-08-28 MED ORDER — RIFAXIMIN 550 MG PO TABS: 550.0000 mg | ORAL_TABLET | Freq: Two times a day (BID) | ORAL | 3 refills | Status: DC

## 2018-08-28 NOTE — Assessment & Plan Note (Signed)
Chronic cirrhosis likely alcohol related.  The patient was on Urso for elevated alkaline phosphatase and mildly positive autoimmune serologies.  [facility] hepatology recommended complete Urso but no further refills.  He feels he still has some lower extremity edema although there is mild trace edema but no pitting.  No tense ascites on exam.  EGD up-to-date 2018 and is on nonselective beta-blocker.  He was on high-dose weekly vitamin D and is completed this.  Recommended he take daily low amount vitamin D supplement we can recheck his vitamin D level in 6 months.  He is required to have 6 months of alcohol cessation counseling to be a candidate for liver transplant in the future.  He has reached out to outpatient counseling and they are currently full but we will call him within the month.  Wrongly encouraged him to attend counseling.  He has not had alcohol in 2-1/2 years and he is congratulated on this.  We will check routine labs and right upper quadrant ultrasound for ongoing care.  Follow-up in 6 months.

## 2018-08-28 NOTE — Progress Notes (Signed)
Referring Provider: Caren Macadam, MD Primary Care Physician:  Sharion Balloon, FNP Primary GI:  Dr. Gala Romney  Chief Complaint  Patient presents with  . Abdominal Pain    HPI:   Thomas Pratt is a 58 y.o. male who presents to follow-up on cirrhosis and for abdominal pain.  The patient was last seen in our office 04/23/2018 for generalized abdominal pain and cirrhosis of the liver likely due to alcohol.  The patient has essentially abstained from alcohol for the past 2 and half years.  Previous work-up found liver biopsies and serologies including elevated alkaline phosphatase and positive ANA for presumed PBC.  He was started on Urso.  EGD and colonoscopy up-to-date August 2018.  CT of the abdomen and pelvis in March 2019 with no acute findings, stable cirrhosis with moderate volume ascites.  He was started on Lasix 40 mg and Spironolactone 100 mg.  He was seen by [facility] liver transplant program who recommended continue Corgard 40 mg daily and 20 mg in the afternoon, lactulose, rifaximin by local GI as well as diuretics and Urso but recommended no further Urso refills until liver biopsy returned.  Meld score calculated to be 8.  They require 6 months of alcohol/substance abuse counseling prior to evaluation for transplant.  His last visit he was not doing "great."  Still with ongoing headache as seen in the emergency department.  Nausea improved, no GERD symptoms.  Abdominal pain ongoing from his last visit also seems to be more frequent and eating too much makes the pain worse.  Bowel movement 3-4 times a week with stools consistently [place] 4 no straining.  Denies overt hepatic symptoms.  Some minimal increase in swelling with reduction in diuretics.  Some persistent fatigue.  Overall his pain was persistent, nagging, frustrating and somewhat worse compared to last visit.  It was centered in the right lower quadrant since hernia repair in 2018.  Not taking Xifaxan due to cost, awaiting  Medicare/disability.  Recommended referral back to surgery to discuss possible adhesions/scar tissue pain, establish with primary care, follow-up in 6 months.  He was seen in follow-up by surgery who indicated limited options.  Recommend avoid exerting any strain in the right groin and notify surgery of any worsening.  He saw primary care 08/20/2018 and at that point was describing abdominal pain which is chronic and intermittent in the epigastric region described as moderate and cramping.  No belching, constipation, diarrhea, hematochezia, vomiting.  Recommended follow-up with GI at upcoming appointment.  Today he states he still is struggling with headaches. He admits 13 lb weight loss (subjectively) over the past 2-03 weeks. Objectively he was 169 lb in October 2019 and is 173 lb today. Still with LE edema. Denies hematochezia, melena, N/V. No GERD symptoms. Abdominal pain is persistent mid-abdomen to RLQ/groin area. Pain is crampy and laying down helps some. Denies yellowing of skin/eyes, darkened urine, generalized pruritis, tremors, acute episodic confusion. Hasn't been sleeping well recently. Ran out of Xifaxan 3 weeks ago and needs a refill. Stopped taking lactulose due to them made him sick. Has a bowel movement about 3 times a day. Denies chest pain, dyspnea, dizziness, lightheadedness, syncope, near syncope. Denies any other upper or lower GI symptoms.  He has tried to start alcohol counseling but they have been full and are supposed to call him within a month to get started. He would like to get his drivers license back at some point too. He has finally gotten on Medicare/disability.  Past Medical History:  Diagnosis Date  . Cirrhosis ([place])    likely ETOH use     Past Surgical History:  Procedure Laterality Date  . CHOLECYSTECTOMY N/A 09/01/2017   Procedure: LAPAROSCOPIC CHOLECYSTECTOMY;  Surgeon: Aviva Signs, MD;  Location: [facility];  Service: General;  Laterality: N/A;  . COLONOSCOPY  WITH PROPOFOL N/A 03/06/2017   Dr. Gala Romney: tubular adenoma removed, next TCS 5 years if health permits.   . ESOPHAGOGASTRODUODENOSCOPY (EGD) WITH PROPOFOL N/A 03/06/2017   Dr. Gala Romney: grade 1 esophageal varices, erosive esophagitis, portal gastropathy.   Marland Kitchen HERNIA REPAIR Right    RIH  . INGUINAL HERNIA REPAIR Right 07/11/2017   Procedure: HERNIA REPAIR INGUINAL ADULT WITH MESH;  Surgeon: Aviva Signs, MD;  Location: [facility];  Service: General;  Laterality: Right;  patient knows to arrive at 7:30  . IR TRANSCATHETER BX  10/14/2017  . IR US GUIDE VASC ACCESS RIGHT  10/14/2017  . IR VENOGRAM HEPATIC WO HEMODYNAMIC EVALUATION  10/14/2017  . POLYPECTOMY  03/06/2017   Procedure: POLYPECTOMY;  Surgeon: Daneil Dolin, MD;  Location: [facility] SUITE;  Service: Endoscopy;;  ascending colon;    Current Outpatient Medications  Medication Sig Dispense Refill  . atorvastatin (LIPITOR) 10 MG tablet Take 1 tablet (10 mg total) by mouth daily. (Patient taking differently: Take 10 mg by mouth every morning. ) 30 tablet 11  . dexlansoprazole (DEXILANT) 60 MG capsule Take 1 capsule (60 mg total) by mouth daily. 90 capsule 3  . furosemide (LASIX) 40 MG tablet Take 1 tablet (40 mg total) by mouth daily. 30 tablet 5  . spironolactone (ALDACTONE) 50 MG tablet Take 1 tablet po bid (Patient taking differently: Take 50 mg by mouth every morning. ) 60 tablet 0  . tizanidine (ZANAFLEX) 6 MG capsule Take 1 capsule (6 mg total) by mouth 3 (three) times daily as needed for muscle spasms. 90 capsule 1  . VITAMIN D PO Take by mouth daily. Unsure of strength    . Vitamin D, Ergocalciferol, (DRISDOL) 50000 units CAPS capsule Take 1 capsule (50,000 Units total) by mouth every 7 (seven) days. (Patient taking differently: Take 50,000 Units by mouth every Monday. ) 12 capsule 0   No current facility-administered medications for this visit.     Allergies as of 08/28/2018  . (No Known Allergies)    Family History  Problem Relation  Age of Onset  . COPD Mother   . Liver disease Neg Hx   . Colon cancer Neg Hx   . Colon polyps Neg Hx     Social History   Socioeconomic History  . Marital status: Divorced    Spouse name: Not on file  . Number of children: Not on file  . Years of education: Not on file  . Highest education level: Not on file  Occupational History  . Occupation: unemployed  Social Needs  . Financial resource strain: Not on file  . Food insecurity:    Worry: Not on file    Inability: Not on file  . Transportation needs:    Medical: Not on file    Non-medical: Not on file  Tobacco Use  . Smoking status: Never Smoker  . Smokeless tobacco: Never Used  Substance and Sexual Activity  . Alcohol use: No    Comment: Quit September 2017, heavy alcohol abuse previously  . Drug use: No  . Sexual activity: Not Currently    Partners: Female  Lifestyle  . Physical activity:    Days  per week: Not on file    Minutes per session: Not on file  . Stress: Not on file  Relationships  . Social connections:    Talks on phone: Not on file    Gets together: Not on file    Attends religious service: Not on file    Active member of club or organization: Not on file    Attends meetings of clubs or organizations: Not on file    Relationship status: Not on file  Other Topics Concern  . Not on file  Social History Narrative   Lives in [place], [place]. 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    Drank alcohol for years, quit 05/07/16. Has been sober since then. Quit b/c of health/cirrhosis.   Divorced for over 15 years.   Dating sporadically.   Has 3 daughters and 1 son live locally.   Originally from [place], moved here b/c of wife.    Eats all foods.    Wear seatbelt.   Does not drive, does not have a license, lost due to DWI.    Brought by neighbor.    Does not go to church. Believes in God. Prays each night, per report.     Review of Systems: General: Negative for anorexia, weight loss,  fever, chills, fatigue, weakness. ENT: Negative for hoarseness, difficulty swallowing CV: Negative for chest pain, angina, palpitations, peripheral edema.  Respiratory: Negative for dyspnea at rest, cough, sputum, wheezing.  GI: See history of present illness.  Derm: Negative for rash or itching.  Neuro: Negative for memory loss, confusion.  Endo: Negative for unusual weight change.  Heme: Negative for bruising or bleeding. Allergy: Negative for rash or hives.   Physical Exam: BP (!) 89/59   Pulse 72   Temp (!) 96.6 F (35.9 C) (Oral)   Ht 5\' 8"  (1.727 m)   Wt 173 lb 3.2 oz (78.6 kg)   BMI 26.33 kg/m  General:   Alert and oriented. Pleasant and cooperative. Well-nourished and well-developed.  Eyes:  Without icterus, sclera clear and conjunctiva pink.  Ears:  Normal auditory acuity. Cardiovascular:  S1, S2 present without murmurs appreciated. Extremities without clubbing. Trace non-pitting bilateral LE edema. Respiratory:  Clear to auscultation bilaterally. No wheezes, rales, or rhonchi. No distress.  Gastrointestinal:  +BS, soft, and non-distended. Mild to moderate RLQ/inguinal TTP. No HSM noted. No guarding or rebound. No masses appreciated. No asterixis.  Rectal:  Deferred  Musculoskalatal:  Symmetrical without gross deformities. Neurologic:  Alert and oriented x4;  grossly normal neurologically. Psych:  Alert and cooperative. Normal mood and affect. Heme/Lymph/Immune: No excessive bruising noted.    08/28/2018 11:10 AM   Disclaimer: This note was dictated with voice recognition software. Similar sounding words can inadvertently be transcribed and may not be corrected upon review.

## 2018-08-28 NOTE — Patient Instructions (Signed)
Your health issues we discussed today were:   Cirrhosis: 1. I have put in lab orders to have completed 2. We will check your ultrasound of your liver 3. I have sent a refill for Xifaxan.  Take this twice a day indefinitely to help prevent confusion 4. Sign up for alcohol counseling as soon as possible.  The transplant center requires this for 6 months to be a candidate for transplant in the future, if needed 5. Continue taking vitamin D3.  We will check your vitamin D level at your next visit  Abdominal pain: 1. Follow-up with primary care or with the surgeon related to your abdominal pain and your hernia area 2. Call us if you have any problems  Swelling in your lower legs: 1. Overall the swelling looks pretty good 2. Continue taking your diuretics (fluid pills) to keep the swelling under control  Overall I recommend:  1. Follow-up in 6 months 2. Call us if you have any questions or concerns.  At [facility] we value your feedback. You may receive a survey about your visit today. Please share your experience as we strive to create trusting relationships with our patients to provide genuine, compassionate, quality care.  We appreciate your understanding and patience as we review any laboratory studies, imaging, and other diagnostic tests that are ordered as we care for you. Our office policy is 5 business days for review of these results, and any emergent or urgent results are addressed in a timely manner for your best interest. If you do not hear from our office in 1 week, please contact us.   We also encourage the use of MyChart, which contains your medical information for your review as well. If you are not enrolled in this feature, an access code is on this after visit summary for your convenience. Thank you for allowing [place] to be involved in your care.  It was great to see you today!  I hope you have a great day!!

## 2018-08-28 NOTE — Assessment & Plan Note (Signed)
He does have persistent abdominal pain typically in the right lower quadrant inguinal area near his inguinal hernia repair with mesh placement.  I feel this is most likely musculoskeletal versus scar tissue/adhesions.  He was seen by the surgeon who stated there were not many options at that time.  He was recommended to follow-up if no improvement.  At this point he is still having abdominal pain.  It is unchanged and in the same area.  I recommended he follow-up with primary care and the surgeon as previously recommended.  I do not feel this is related to his liver disease.  Return for follow-up in 6 months.

## 2018-08-28 NOTE — Assessment & Plan Note (Signed)
He complains of ongoing edema.  He does have trace edema but nonpitting.  Overall does not appear to be concerning.  Recommend he continue his diuretics at current dose today appear to be effective.  Follow-up in 6 months.

## 2018-08-31 NOTE — Patient Instructions (Signed)
Attempted to do PA for [place] abd RUQ via [employer]. No PA needed: NOTE: This [facility] member does not require prior authorization for OUTPATIENT Radiology through [employer] or [employer] at this time.

## 2018-08-31 NOTE — Progress Notes (Signed)
CC'D TO PCP °

## 2018-09-09 ENCOUNTER — Other Ambulatory Visit (HOSPITAL_COMMUNITY)
Admission: RE | Admit: 2018-09-09 | Discharge: 2018-09-09 | Disposition: A | Discharge status: Home / Self Care | Payer: Medicaid Other | Source: Ambulatory Visit | Attending: Nurse Practitioner | Admitting: Nurse Practitioner

## 2018-09-09 ENCOUNTER — Ambulatory Visit (HOSPITAL_COMMUNITY)
Admission: RE | Admit: 2018-09-09 | Discharge: 2018-09-09 | Disposition: A | Discharge status: Home / Self Care | Payer: Self-pay | Source: Ambulatory Visit | Attending: Nurse Practitioner | Admitting: Nurse Practitioner

## 2018-09-09 DIAGNOSIS — K7460 Unspecified cirrhosis of liver: Secondary | ICD-10-CM | POA: Insufficient documentation

## 2018-09-09 DIAGNOSIS — R1084 Generalized abdominal pain: Secondary | ICD-10-CM | POA: Insufficient documentation

## 2018-09-09 DIAGNOSIS — R600 Localized edema: Secondary | ICD-10-CM | POA: Insufficient documentation

## 2018-09-09 LAB — PROTIME-INR
INR: 1.09
Prothrombin Time: 14 seconds (ref 11.4–15.2)

## 2018-09-09 LAB — CBC WITH DIFFERENTIAL/PLATELET
Abs Immature Granulocytes: 0.01 10*3/uL (ref 0.00–0.07)
Basophils Absolute: 0 10*3/uL (ref 0.0–0.1)
Basophils Relative: 0 %
Eosinophils Absolute: 0.1 10*3/uL (ref 0.0–0.5)
Eosinophils Relative: 1 %
HCT: 41.9 % (ref 39.0–52.0)
Hemoglobin: 13.1 g/dL (ref 13.0–17.0)
Immature Granulocytes: 0 %
LYMPHS ABS: 1.4 10*3/uL (ref 0.7–4.0)
Lymphocytes Relative: 23 %
MCH: 28.4 pg (ref 26.0–34.0)
MCHC: 31.3 g/dL (ref 30.0–36.0)
MCV: 90.9 fL (ref 80.0–100.0)
MONO ABS: 0.4 10*3/uL (ref 0.1–1.0)
Monocytes Relative: 6 %
Neutro Abs: 4.3 10*3/uL (ref 1.7–7.7)
Neutrophils Relative %: 70 %
Platelets: 158 10*3/uL (ref 150–400)
RBC: 4.61 MIL/uL (ref 4.22–5.81)
RDW: 14.9 % (ref 11.5–15.5)
WBC: 6.2 10*3/uL (ref 4.0–10.5)
nRBC: 0 % (ref 0.0–0.2)

## 2018-09-09 LAB — COMPREHENSIVE METABOLIC PANEL
ALT: 47 U/L — ABNORMAL HIGH (ref 0–44)
AST: 49 U/L — ABNORMAL HIGH (ref 15–41)
Albumin: 3.6 g/dL (ref 3.5–5.0)
Alkaline Phosphatase: 55 U/L (ref 38–126)
Anion gap: 8 (ref 5–15)
BILIRUBIN TOTAL: 0.9 mg/dL (ref 0.3–1.2)
BUN: 12 mg/dL (ref 6–20)
CO2: 25 mmol/L (ref 22–32)
Calcium: 9.4 mg/dL (ref 8.9–10.3)
Chloride: 102 mmol/L (ref 98–111)
Creatinine, Ser: 0.97 mg/dL (ref 0.61–1.24)
GFR calc Af Amer: >60 mL/min (ref >60)
GFR calc non Af Amer: >60 mL/min (ref >60)
Glucose, Bld: 279 mg/dL — ABNORMAL HIGH (ref 70–99)
Potassium: 4.7 mmol/L (ref 3.5–5.1)
Sodium: 135 mmol/L (ref 135–145)
Total Protein: 7 g/dL (ref 6.5–8.1)

## 2018-09-10 LAB — AFP TUMOR MARKER: AFP, Serum, Tumor Marker: 1.8 ng/mL (ref 0.0–8.3)

## 2018-09-18 ENCOUNTER — Other Ambulatory Visit: Payer: Self-pay | Admitting: Nurse Practitioner

## 2018-09-18 DIAGNOSIS — K7460 Unspecified cirrhosis of liver: Secondary | ICD-10-CM

## 2018-09-21 ENCOUNTER — Telehealth: Payer: Self-pay | Admitting: Gastroenterology

## 2018-09-21 NOTE — Telephone Encounter (Signed)
Forwarding to AM, this is RMR pt.

## 2018-09-21 NOTE — Telephone Encounter (Signed)
Patient thinks someone called him from this office, please call back

## 2018-09-21 NOTE — Telephone Encounter (Signed)
Spoke with pt. Pt notified of results.  

## 2018-10-22 ENCOUNTER — Other Ambulatory Visit: Payer: Self-pay

## 2018-10-22 DIAGNOSIS — K7460 Unspecified cirrhosis of liver: Secondary | ICD-10-CM

## 2018-10-30 ENCOUNTER — Other Ambulatory Visit: Payer: Self-pay | Admitting: Gastroenterology

## 2018-11-08 ENCOUNTER — Emergency Department (HOSPITAL_COMMUNITY): Payer: Medicare Other

## 2018-11-08 ENCOUNTER — Encounter (HOSPITAL_COMMUNITY): Payer: Self-pay | Admitting: *Deleted

## 2018-11-08 ENCOUNTER — Observation Stay (HOSPITAL_COMMUNITY)
Admission: EM | Admit: 2018-11-08 | Discharge: 2018-11-09 | Disposition: A | Discharge status: Home / Self Care | Payer: Medicare Other | Attending: Family Medicine | Admitting: Family Medicine

## 2018-11-08 ENCOUNTER — Other Ambulatory Visit: Payer: Self-pay

## 2018-11-08 DIAGNOSIS — R1084 Generalized abdominal pain: Secondary | ICD-10-CM

## 2018-11-08 DIAGNOSIS — E119 Type 2 diabetes mellitus without complications: Secondary | ICD-10-CM | POA: Insufficient documentation

## 2018-11-08 DIAGNOSIS — E1165 Type 2 diabetes mellitus with hyperglycemia: Secondary | ICD-10-CM

## 2018-11-08 DIAGNOSIS — F1010 Alcohol abuse, uncomplicated: Secondary | ICD-10-CM | POA: Diagnosis not present

## 2018-11-08 DIAGNOSIS — I959 Hypotension, unspecified: Secondary | ICD-10-CM

## 2018-11-08 DIAGNOSIS — I639 Cerebral infarction, unspecified: Secondary | ICD-10-CM | POA: Diagnosis present

## 2018-11-08 DIAGNOSIS — K7030 Alcoholic cirrhosis of liver without ascites: Secondary | ICD-10-CM

## 2018-11-08 DIAGNOSIS — Z79899 Other long term (current) drug therapy: Secondary | ICD-10-CM | POA: Diagnosis not present

## 2018-11-08 DIAGNOSIS — R4182 Altered mental status, unspecified: Secondary | ICD-10-CM | POA: Diagnosis not present

## 2018-11-08 DIAGNOSIS — Z8673 Personal history of transient ischemic attack (TIA), and cerebral infarction without residual deficits: Secondary | ICD-10-CM | POA: Diagnosis not present

## 2018-11-08 DIAGNOSIS — F1011 Alcohol abuse, in remission: Secondary | ICD-10-CM

## 2018-11-08 DIAGNOSIS — R42 Dizziness and giddiness: Principal | ICD-10-CM

## 2018-11-08 DIAGNOSIS — R188 Other ascites: Secondary | ICD-10-CM

## 2018-11-08 DIAGNOSIS — K7460 Unspecified cirrhosis of liver: Secondary | ICD-10-CM

## 2018-11-08 DIAGNOSIS — R600 Localized edema: Secondary | ICD-10-CM

## 2018-11-08 DIAGNOSIS — E785 Hyperlipidemia, unspecified: Secondary | ICD-10-CM

## 2018-11-08 LAB — CBC WITH DIFFERENTIAL/PLATELET
Abs Immature Granulocytes: 0.01 10*3/uL (ref 0.00–0.07)
Basophils Absolute: 0 10*3/uL (ref 0.0–0.1)
Basophils Relative: 1 %
Eosinophils Absolute: 0.1 10*3/uL (ref 0.0–0.5)
Eosinophils Relative: 2 %
HCT: 40.3 % (ref 39.0–52.0)
Hemoglobin: 13.1 g/dL (ref 13.0–17.0)
Immature Granulocytes: 0 %
Lymphocytes Relative: 31 %
Lymphs Abs: 1.1 10*3/uL (ref 0.7–4.0)
MCH: 29.3 pg (ref 26.0–34.0)
MCHC: 32.5 g/dL (ref 30.0–36.0)
MCV: 90.2 fL (ref 80.0–100.0)
Monocytes Absolute: 0.2 10*3/uL (ref 0.1–1.0)
Monocytes Relative: 6 %
Neutro Abs: 2.2 10*3/uL (ref 1.7–7.7)
Neutrophils Relative %: 60 %
Platelets: 164 10*3/uL (ref 150–400)
RBC: 4.47 MIL/uL (ref 4.22–5.81)
RDW: 15 % (ref 11.5–15.5)
WBC: 3.6 10*3/uL — ABNORMAL LOW (ref 4.0–10.5)
nRBC: 0 % (ref 0.0–0.2)

## 2018-11-08 LAB — BASIC METABOLIC PANEL
Anion gap: 8 (ref 5–15)
BUN: 12 mg/dL (ref 6–20)
CO2: 24 mmol/L (ref 22–32)
Calcium: 9.4 mg/dL (ref 8.9–10.3)
Chloride: 102 mmol/L (ref 98–111)
Creatinine, Ser: 0.95 mg/dL (ref 0.61–1.24)
GFR calc Af Amer: >60 mL/min (ref >60)
GFR calc non Af Amer: >60 mL/min (ref >60)
Glucose, Bld: 260 mg/dL — ABNORMAL HIGH (ref 70–99)
Potassium: 4.7 mmol/L (ref 3.5–5.1)
Sodium: 134 mmol/L — ABNORMAL LOW (ref 135–145)

## 2018-11-08 LAB — GLUCOSE, CAPILLARY
Glucose-Capillary: 136 mg/dL — ABNORMAL HIGH (ref 70–99)
Glucose-Capillary: 171 mg/dL — ABNORMAL HIGH (ref 70–99)

## 2018-11-08 LAB — PHOSPHORUS: Phosphorus: 3.2 mg/dL (ref 2.5–4.6)

## 2018-11-08 LAB — RAPID URINE DRUG SCREEN, HOSP PERFORMED
Amphetamines: NOT DETECTED
Barbiturates: NOT DETECTED
Benzodiazepines: NOT DETECTED
Cocaine: NOT DETECTED
Opiates: NOT DETECTED
Tetrahydrocannabinol: NOT DETECTED

## 2018-11-08 LAB — AMMONIA: Ammonia: 28 umol/L (ref 9–35)

## 2018-11-08 LAB — URINALYSIS, ROUTINE W REFLEX MICROSCOPIC
Bilirubin Urine: NEGATIVE
Glucose, UA: 50 mg/dL — AB
Hgb urine dipstick: NEGATIVE
Ketones, ur: NEGATIVE mg/dL
Leukocytes,Ua: NEGATIVE
Nitrite: NEGATIVE
Protein, ur: NEGATIVE mg/dL
Specific Gravity, Urine: 1.005 (ref 1.005–1.030)
pH: 6 (ref 5.0–8.0)

## 2018-11-08 LAB — PROTIME-INR
INR: 1.1 (ref 0.8–1.2)
Prothrombin Time: 13.9 seconds (ref 11.4–15.2)

## 2018-11-08 LAB — HEPATIC FUNCTION PANEL
ALT: 40 U/L (ref 0–44)
AST: 37 U/L (ref 15–41)
Albumin: 3.8 g/dL (ref 3.5–5.0)
Alkaline Phosphatase: 82 U/L (ref 38–126)
Bilirubin, Direct: 0.1 mg/dL (ref 0.0–0.2)
Indirect Bilirubin: 0.8 mg/dL (ref 0.3–0.9)
Total Bilirubin: 0.9 mg/dL (ref 0.3–1.2)
Total Protein: 7.4 g/dL (ref 6.5–8.1)

## 2018-11-08 LAB — MAGNESIUM: Magnesium: 1.8 mg/dL (ref 1.7–2.4)

## 2018-11-08 LAB — TSH: TSH: 0.901 u[IU]/mL (ref 0.350–4.500)

## 2018-11-08 LAB — VITAMIN B12: Vitamin B-12: 270 pg/mL (ref 180–914)

## 2018-11-08 LAB — ETHANOL: Alcohol, Ethyl (B): <10 mg/dL (ref <10)

## 2018-11-08 LAB — BRAIN NATRIURETIC PEPTIDE: B Natriuretic Peptide: 21 pg/mL (ref 0.0–100.0)

## 2018-11-08 LAB — LIPASE, BLOOD: Lipase: 46 U/L (ref 11–51)

## 2018-11-08 LAB — HEMOGLOBIN A1C
Hgb A1c MFr Bld: 9.6 % — ABNORMAL HIGH (ref 4.8–5.6)
Mean Plasma Glucose: 228.82 mg/dL

## 2018-11-08 LAB — CORTISOL: Cortisol, Plasma: 7 ug/dL

## 2018-11-08 LAB — LACTIC ACID, PLASMA: Lactic Acid, Venous: 1.5 mmol/L (ref 0.5–1.9)

## 2018-11-08 LAB — TROPONIN I: Troponin I: <0.03 ng/mL (ref <0.03)

## 2018-11-08 MED ORDER — ATORVASTATIN CALCIUM 10 MG PO TABS
10.0000 mg | ORAL_TABLET | Freq: Every morning | ORAL | Status: DC
  Administered 2018-11-09: 10 mg via ORAL
  Filled 2018-11-08: qty 1

## 2018-11-08 MED ORDER — ONDANSETRON HCL 4 MG/2ML IJ SOLN: 4.0000 mg | Freq: Four times a day (QID) | INTRAMUSCULAR | Status: DC | PRN

## 2018-11-08 MED ORDER — SODIUM CHLORIDE 0.9 % IV BOLUS
1000.0000 mL | Freq: Once | INTRAVENOUS | Status: AC
  Administered 2018-11-08: 1000 mL via INTRAVENOUS

## 2018-11-08 MED ORDER — ASPIRIN EC 81 MG PO TBEC
81.0000 mg | DELAYED_RELEASE_TABLET | Freq: Every day | ORAL | Status: DC
  Administered 2018-11-08 – 2018-11-09 (×2): 81 mg via ORAL
  Filled 2018-11-08 (×2): qty 1

## 2018-11-08 MED ORDER — SODIUM CHLORIDE 0.9 % IV SOLN
INTRAVENOUS | Status: DC
  Administered 2018-11-08: 13:00:00 via INTRAVENOUS

## 2018-11-08 MED ORDER — MECLIZINE HCL 12.5 MG PO TABS
ORAL_TABLET | ORAL | Status: AC
  Administered 2018-11-08: 12.5 mg
  Filled 2018-11-08: qty 1

## 2018-11-08 MED ORDER — MECLIZINE HCL 12.5 MG PO TABS: 12.5000 mg | ORAL_TABLET | Freq: Three times a day (TID) | ORAL | Status: DC | PRN

## 2018-11-08 MED ORDER — ONDANSETRON HCL 4 MG PO TABS: 4.0000 mg | ORAL_TABLET | Freq: Four times a day (QID) | ORAL | Status: DC | PRN

## 2018-11-08 MED ORDER — ASPIRIN 81 MG PO CHEW
CHEWABLE_TABLET | ORAL | Status: AC
  Administered 2018-11-08: 81 mg
  Filled 2018-11-08: qty 1

## 2018-11-08 MED ORDER — PANTOPRAZOLE SODIUM 40 MG PO TBEC
40.0000 mg | DELAYED_RELEASE_TABLET | Freq: Every day | ORAL | Status: DC
  Administered 2018-11-08 – 2018-11-09 (×2): 40 mg via ORAL
  Filled 2018-11-08: qty 1

## 2018-11-08 MED ORDER — SODIUM CHLORIDE 0.9 % IV SOLN
INTRAVENOUS | Status: AC
  Administered 2018-11-08 – 2018-11-09 (×2): via INTRAVENOUS

## 2018-11-08 MED ORDER — RIFAXIMIN 550 MG PO TABS
550.0000 mg | ORAL_TABLET | Freq: Two times a day (BID) | ORAL | Status: DC
  Administered 2018-11-08 – 2018-11-09 (×2): 550 mg via ORAL
  Filled 2018-11-08 (×2): qty 1

## 2018-11-08 MED ORDER — INSULIN ASPART 100 UNIT/ML ~~LOC~~ SOLN
0.0000 [IU] | Freq: Three times a day (TID) | SUBCUTANEOUS | Status: DC
  Administered 2018-11-09: 3 [IU] via SUBCUTANEOUS
  Administered 2018-11-09: 08:00:00 1 [IU] via SUBCUTANEOUS

## 2018-11-08 MED ORDER — PANTOPRAZOLE SODIUM 40 MG PO TBEC
DELAYED_RELEASE_TABLET | ORAL | Status: AC
  Administered 2018-11-08: 40 mg via ORAL
  Filled 2018-11-08: qty 1

## 2018-11-08 NOTE — ED Notes (Signed)
Pt was informed that we need a urine sample. Pt was given a urinal. 

## 2018-11-08 NOTE — ED Notes (Signed)
Report to Dana, RN

## 2018-11-08 NOTE — ED Provider Notes (Addendum)
[facility] EMERGENCY DEPARTMENT Provider Note   CSN: [id number] Arrival date & time: 11/08/18  1147    History   Chief Complaint Chief Complaint  Patient presents with   Dizziness    HPI Thomas Pratt is a 58 y.o. male.     Patient brought in by roommate.  Patient is brought in with a complaint of dizziness for 3 weeks.  Not vertigo no room spinning.  Also had some nausea today.  Says that the dizziness is worse.  He feels very tired and is sleepy.  Denies any shortness of breath or chest pain or abdominal pain.  Patient has a history of cirrhosis.  Also denies fever congestion or cough.  Patient denies any alcohol consumption.     Past Medical History:  Diagnosis Date   Cirrhosis ([place])    likely ETOH use     Patient Active Problem List   Diagnosis Date Noted   History of ETOH abuse 02/25/2018   Edema 12/05/2017   Abdominal pain 11/05/2017   Primary biliary cholangitis ([place]) 11/05/2017   Type 2 diabetes mellitus with other specified complication ([place]) 40/98/1191   Abnormal LFTs 09/16/2017   Calculus of gallbladder without cholecystitis without obstruction    Dyspepsia 07/31/2017   Inguinal hernia 11/14/2016   Lower extremity edema    Cirrhosis ([place]) 05/21/2016   Hypoalbuminemia 05/21/2016   Hyponatremia 05/21/2016   Hypokalemia 05/21/2016   HTN (hypertension) 05/21/2016   Ascites 47/82/9562   ALC (alcoholic liver cirrhosis) ([place]) 05/21/2016    Past Surgical History:  Procedure Laterality Date   CHOLECYSTECTOMY N/A 09/01/2017   Procedure: LAPAROSCOPIC CHOLECYSTECTOMY;  Surgeon: Aviva Signs, MD;  Location: [facility];  Service: General;  Laterality: N/A;   COLONOSCOPY WITH PROPOFOL N/A 03/06/2017   Dr. Gala Romney: tubular adenoma removed, next TCS 5 years if health permits.    ESOPHAGOGASTRODUODENOSCOPY (EGD) WITH PROPOFOL N/A 03/06/2017   Dr. Gala Romney: grade 1 esophageal varices, erosive esophagitis, portal gastropathy.    HERNIA REPAIR Right     RIH   INGUINAL HERNIA REPAIR Right 07/11/2017   Procedure: HERNIA REPAIR INGUINAL ADULT WITH MESH;  Surgeon: Aviva Signs, MD;  Location: [facility];  Service: General;  Laterality: Right;  patient knows to arrive at 7:30   IR TRANSCATHETER BX  10/14/2017   IR US GUIDE VASC ACCESS RIGHT  10/14/2017   IR VENOGRAM HEPATIC WO HEMODYNAMIC EVALUATION  10/14/2017   POLYPECTOMY  03/06/2017   Procedure: POLYPECTOMY;  Surgeon: Daneil Dolin, MD;  Location: [facility] SUITE;  Service: Endoscopy;;  ascending colon;        Home Medications    Prior to Admission medications   Medication Sig Start Date End Date Taking? Authorizing Provider  atorvastatin (LIPITOR) 10 MG tablet Take 1 tablet (10 mg total) by mouth daily. Patient taking differently: Take 10 mg by mouth every morning.  05/19/18 05/19/19  Sharion Balloon, FNP  dexlansoprazole (DEXILANT) 60 MG capsule Take 1 capsule (60 mg total) by mouth daily. 07/03/18   Annitta Needs, NP  furosemide (LASIX) 40 MG tablet TAKE 1 TABLET BY MOUTH EVERY DAY 11/02/18   Mahala Menghini, PA-C  rifaximin (XIFAXAN) 550 MG TABS tablet Take 1 tablet (550 mg total) by mouth 2 (two) times daily. 08/28/18   Carlis Stable, NP  spironolactone (ALDACTONE) 50 MG tablet Take 1 tablet po bid Patient taking differently: Take 50 mg by mouth every morning.  04/03/18   Caren Macadam, MD  tizanidine (ZANAFLEX) 6 MG capsule Take  1 capsule (6 mg total) by mouth 3 (three) times daily as needed for muscle spasms. 08/10/18   Claretta Fraise, MD  VITAMIN D PO Take by mouth daily. Unsure of strength    [provider]  Vitamin D, Ergocalciferol, (DRISDOL) 50000 units CAPS capsule Take 1 capsule (50,000 Units total) by mouth every 7 (seven) days. Patient taking differently: Take 50,000 Units by mouth every Monday.  10/30/17   Caren Macadam, MD    Family History Family History  Problem Relation Age of Onset   COPD Mother    Liver disease Neg Hx    Colon cancer Neg Hx     Colon polyps Neg Hx     Social History Social History   Tobacco Use   Smoking status: Never Smoker   Smokeless tobacco: Never Used  Substance Use Topics   Alcohol use: No    Comment: Quit September 2017, heavy alcohol abuse previously   Drug use: No     Allergies   Patient has no known allergies.   Review of Systems Review of Systems  Constitutional: Positive for fatigue. Negative for chills and fever.  HENT: Negative for congestion, rhinorrhea and sore throat.   Eyes: Negative for visual disturbance.  Respiratory: Negative for cough and shortness of breath.   Cardiovascular: Negative for chest pain and leg swelling.  Gastrointestinal: Positive for nausea. Negative for abdominal pain, diarrhea and vomiting.  Genitourinary: Negative for dysuria.  Musculoskeletal: Negative for back pain and neck pain.  Skin: Negative for rash.  Neurological: Positive for dizziness. Negative for syncope, light-headedness and headaches.  Hematological: Does not bruise/bleed easily.  Psychiatric/Behavioral: Positive for confusion.     Physical Exam Updated Vital Signs BP (!) 76/50    Pulse (!) 58    Temp 97.6 F (36.4 C) (Oral)    Resp 14    Ht 1.727 m (5\' 8" )    Wt 81.6 kg    SpO2 99%    BMI 27.37 kg/m   Physical Exam Vitals signs and nursing note reviewed.  Constitutional:      Appearance: He is well-developed. He is ill-appearing.  HENT:     Head: Normocephalic and atraumatic.  Eyes:     Conjunctiva/sclera: Conjunctivae normal.  Neck:     Musculoskeletal: Normal range of motion and neck supple.  Cardiovascular:     Rate and Rhythm: Regular rhythm. Bradycardia present.     Heart sounds: No murmur.  Pulmonary:     Effort: Pulmonary effort is normal. No respiratory distress.     Breath sounds: Normal breath sounds.  Abdominal:     General: Bowel sounds are normal. There is no distension.     Palpations: Abdomen is soft.     Tenderness: There is no abdominal tenderness.    Musculoskeletal:     Right lower leg: No edema.     Left lower leg: No edema.  Skin:    General: Skin is warm and dry.  Neurological:     General: No focal deficit present.     Mental Status: He is alert and oriented to person, place, and time.     Comments: Somnolent      ED Treatments / Results  Labs (all labs ordered are listed, but only abnormal results are displayed) Labs Reviewed  CBC WITH DIFFERENTIAL/PLATELET - Abnormal; Notable for the following components:      Result Value   WBC 3.6 (*)    All other components within normal limits  BASIC METABOLIC PANEL -  Abnormal; Notable for the following components:   Sodium 134 (*)    Glucose, Bld 260 (*)    All other components within normal limits  URINALYSIS, ROUTINE W REFLEX MICROSCOPIC - Abnormal; Notable for the following components:   Glucose, UA 50 (*)    All other components within normal limits  CULTURE, BLOOD (ROUTINE X 2)  CULTURE, BLOOD (ROUTINE X 2)  PROTIME-INR  AMMONIA  HEPATIC FUNCTION PANEL  LIPASE, BLOOD  BRAIN NATRIURETIC PEPTIDE  LACTIC ACID, PLASMA  TROPONIN I  ETHANOL  RAPID URINE DRUG SCREEN, HOSP PERFORMED    EKG EKG Interpretation  Date/Time:  Sunday November 08 2018 12:05:12 EDT Ventricular Rate:  66 PR Interval:    QRS Duration: 86 QT Interval:  417 QTC Calculation: 437 R Axis:   -17 Text Interpretation:  Sinus rhythm Abnormal R-wave progression, early transition Left ventricular hypertrophy No significant change since last tracing Confirmed by Fredia Sorrow [phone]) on 11/08/2018 12:41:46 PM   Radiology Ct Head Wo Contrast  Result Date: 11/08/2018 CLINICAL DATA:  Dizziness for 3 weeks. EXAM: CT HEAD WITHOUT CONTRAST TECHNIQUE: Contiguous axial images were obtained from the base of the skull through the vertex without intravenous contrast. COMPARISON:  August 05, 2018 FINDINGS: Brain: No subdural, epidural, or subarachnoid hemorrhage. Scattered cerebellar infarcts are stable. No  acute infarcts noted. Brainstem and basal cisterns are unchanged. Ventricles and sulci are prominent but stable. No mass effect or midline shift. A lacunar infarct in the left caudate is stable. No acute cortical ischemia or infarct. Vascular: No hyperdense vessel or unexpected calcification. Skull: Normal. Negative for fracture or focal lesion. Sinuses/Orbits: No acute finding. Other: None. IMPRESSION: 1. No acute intracranial abnormalities. Chronic infarcts in the cerebellar hemispheres and the left caudate. Electronically Signed   By: Dorise Bullion III M.D   On: 11/08/2018 13:51   Dg Chest Port 1 View  Result Date: 11/08/2018 CLINICAL DATA:  Hypotension.  Altered mental status. EXAM: PORTABLE CHEST 1 VIEW COMPARISON:  None. FINDINGS: The heart size and mediastinal contours are within normal limits. Both lungs are clear. The visualized skeletal structures are unremarkable. IMPRESSION: No active disease. Electronically Signed   By: Dorise Bullion III M.D   On: 11/08/2018 13:55    Procedures Procedures (including critical care time)  CRITICAL CARE Performed by: Fredia Sorrow Total critical care time: 30 minutes Critical care time was exclusive of separately billable procedures and treating other patients. Critical care was necessary to treat or prevent imminent or life-threatening deterioration. Critical care was time spent personally by me on the following activities: development of treatment plan with patient and/or surrogate as well as nursing, discussions with consultants, evaluation of patient's response to treatment, examination of patient, obtaining history from patient or surrogate, ordering and performing treatments and interventions, ordering and review of laboratory studies, ordering and review of radiographic studies, pulse oximetry and re-evaluation of patient's condition.   Medications Ordered in ED Medications  0.9 %  sodium chloride infusion ( Intravenous New Bag/Given  11/08/18 1319)  sodium chloride 0.9 % bolus 1,000 mL (0 mLs Intravenous Stopped 11/08/18 1316)  sodium chloride 0.9 % bolus 1,000 mL (1,000 mLs Intravenous New Bag/Given 11/08/18 1317)  sodium chloride 0.9 % bolus 1,000 mL (1,000 mLs Intravenous New Bag/Given 11/08/18 1429)     Initial Impression / Assessment and Plan / ED Course  I have reviewed the triage vital signs and the nursing notes.  Pertinent labs & imaging results that were available during my care of the  patient were reviewed by me and considered in my medical decision making (see chart for details).       Patient states that has been very somnolent.  He is alert he will follow commands.  There may be some degree of confusion he feels as if he is confused.  He states he has had dizziness for weeks or months.  Was asked to see him for dizziness here in the emergency department in January.  Specifically on January 15.  At that time patient's blood pressures were normal.  Patient also has family practice visit in January with normal blood pressures.  He did have a visit by GI medicine and more recently with a systolic pressure of 89.  But today pressures are kind of in the low 70s the best we have ever gotten here is 80 denies at 3 L of fluids made no change.  Extensive work-up shows normal lactic acid head CT chest x-ray normal urinalysis negative for infection white count slightly on the low side his albumin is actually normal liver function test are normal.  In discussion with the hospitalist they felt that this was sort of baseline blood pressure but chart review from January seems to show that that is not the case.  Not concerned about infection with a lactic acid being normal.  But do feel that patient symptoms could certainly be related to medications to include Lasix and Zanaflex.  However I think the pressures are so low that patient warrants admission until this blood pressure is corrected.  I do not think that this is his  baseline.  We will recontact the hospitalist.  If they do not want to admit I want them to consult and send the patient home themselves.   Final Clinical Impressions(s) / ED Diagnoses   Final diagnoses:  Hypotension, unspecified hypotension type  Altered mental status, unspecified altered mental status type    ED Discharge Orders    None       Fredia Sorrow, MD 11/08/18 1448  We discussed with Dr. Dyann Kief he will come see the patient.  As a stated above I am concerned about these low blood pressures checking pressures in the other arm may no difference.  He will come and evaluate for possible admission.  Do feel that is probably medication related and patient is not showing evidence of sepsis or any evidence of any serious infection.    Fredia Sorrow, MD 11/08/18 1510    Fredia Sorrow, MD 11/08/18 [phone]

## 2018-11-08 NOTE — ED Notes (Signed)
hospitalist at bedside

## 2018-11-08 NOTE — Plan of Care (Signed)

## 2018-11-08 NOTE — ED Notes (Signed)
Lab draw   To Rad

## 2018-11-08 NOTE — ED Notes (Signed)
Pt has been given ASA 81 mg - it has not cleared the screen

## 2018-11-08 NOTE — ED Triage Notes (Signed)
Pt with dizziness for 3 weeks, worse today and nausea started today as well. Pt denies sob or cp. Denies fever.

## 2018-11-08 NOTE — ED Notes (Signed)
Call to lab  They will add labs TSH, B12 to previous draw

## 2018-11-08 NOTE — ED Notes (Signed)
Pt reports a 3 week history of dizziness  Saw the physician who follows his cirrhosis and "was given some pills but they don't help"   Did not follow up   Here for continued dizziness as well as N

## 2018-11-08 NOTE — H&P (Signed)
History and Physical    [name] LDJ:[id number] DOB: 1961-04-21 DOA: 11/08/2018  Referring MD/NP/PA: Dr. Rogene Houston PCP: Sharion Balloon, FNP  Patient coming from: Home  Chief Complaint: Dizziness  HPI: Thomas Pratt is a 58 y.o. male with a past medical history significant for type 2 diabetes mellitus, prior history of stroke, history of cirrhosis secondary to alcohol abuse (currently 2 years without drinking), gastroesophageal reflux disease and grade 1 esophageal varices (2018); who presented to the emergency department secondary to dizziness and not feeling well.  Patient reports symptom has been going on on and off for the last 3 weeks but worsened in the last 24 hours prior to admission.  Patient rated felt as a almost passing out when changing positions.  He denies any fever, chills, chest pain, shortness of breath, cough, sick contacts, hematuria, dysuria, melena, hematochezia, hematemesis, focal weakness or any other complaints.  In the ED patient was found to be hypertensive, positive orthostatic changes and a CT scan of the head without acute intracranial process.  Despite fluid resuscitation patient remained hypertensive and mildly symptomatic.  TRH has been consulted to place in observation for further evaluation and management.  Past Medical/Surgical History: Past Medical History:  Diagnosis Date  . Cirrhosis ([place])    likely ETOH use     Past Surgical History:  Procedure Laterality Date  . CHOLECYSTECTOMY N/A 09/01/2017   Procedure: LAPAROSCOPIC CHOLECYSTECTOMY;  Surgeon: Aviva Signs, MD;  Location: [facility];  Service: General;  Laterality: N/A;  . COLONOSCOPY WITH PROPOFOL N/A 03/06/2017   Dr. Gala Romney: tubular adenoma removed, next TCS 5 years if health permits.   . ESOPHAGOGASTRODUODENOSCOPY (EGD) WITH PROPOFOL N/A 03/06/2017   Dr. Gala Romney: grade 1 esophageal varices, erosive esophagitis, portal gastropathy.   Marland Kitchen HERNIA REPAIR Right    RIH  . INGUINAL HERNIA  REPAIR Right 07/11/2017   Procedure: HERNIA REPAIR INGUINAL ADULT WITH MESH;  Surgeon: Aviva Signs, MD;  Location: [facility];  Service: General;  Laterality: Right;  patient knows to arrive at 7:30  . IR TRANSCATHETER BX  10/14/2017  . IR US GUIDE VASC ACCESS RIGHT  10/14/2017  . IR VENOGRAM HEPATIC WO HEMODYNAMIC EVALUATION  10/14/2017  . POLYPECTOMY  03/06/2017   Procedure: POLYPECTOMY;  Surgeon: Daneil Dolin, MD;  Location: [facility];  Service: Endoscopy;;  ascending colon;    Social History:  reports that he has never smoked. He has never used smokeless tobacco. He reports that he does not drink alcohol or use drugs.  Allergies: No Known Allergies  Family History:  Family History  Problem Relation Age of Onset  . COPD Mother   . Liver disease Neg Hx   . Colon cancer Neg Hx   . Colon polyps Neg Hx     Prior to Admission medications   Medication Sig Start Date End Date Taking? Authorizing Provider  atorvastatin (LIPITOR) 10 MG tablet Take 1 tablet (10 mg total) by mouth daily. Patient taking differently: Take 10 mg by mouth every morning.  05/19/18 05/19/19  Sharion Balloon, FNP  dexlansoprazole (DEXILANT) 60 MG capsule Take 1 capsule (60 mg total) by mouth daily. 07/03/18   Annitta Needs, NP  furosemide (LASIX) 40 MG tablet TAKE 1 TABLET BY MOUTH EVERY DAY 11/02/18   Mahala Menghini, PA-C  rifaximin (XIFAXAN) 550 MG TABS tablet Take 1 tablet (550 mg total) by mouth 2 (two) times daily. 08/28/18   Carlis Stable, NP  spironolactone (ALDACTONE) 50 MG tablet Take 1  tablet po bid Patient taking differently: Take 50 mg by mouth every morning.  04/03/18   Caren Macadam, MD  VITAMIN D PO Take by mouth daily. Unsure of strength    [provider]  Vitamin D, Ergocalciferol, (DRISDOL) 50000 units CAPS capsule Take 1 capsule (50,000 Units total) by mouth every 7 (seven) days. Patient taking differently: Take 50,000 Units by mouth every Monday.  10/30/17   Caren Macadam, MD     Review of Systems:  Negative except as otherwise mentioned in HPI.   Physical Exam: Vitals:   11/08/18 1530 11/08/18 1600 11/08/18 1630 11/08/18 1700  BP: 98/72 93/68 105/66 107/68  Pulse: (!) 51 61 (!) 49 (!) 57  Resp: 12 13 11 15   Temp:      TempSrc:      SpO2: 100% 100% 100% 100%  Weight:      Height:       Constitutional: NAD, calm, comfortable; reports feeling woozy and dizzy especially when changing position.  There is no spinning sensation reported. Eyes: PERRL, lids and conjunctivae normal; no icterus, no nystagmus. ENMT: Mucous membranes are slightly dry on exam. Posterior pharynx clear of any exudate or lesions. Neck: normal, supple, no masses, no thyromegaly Respiratory: clear to auscultation bilaterally, no wheezing, no crackles. Normal respiratory effort. No accessory muscle use.  Cardiovascular: Regular rate and rhythm, no murmurs / rubs / gallops.  Positive trace edema bilaterally appreciated in his lower extremities. No carotid bruits.  Abdomen: no tenderness, no masses palpated. No hepatosplenomegaly. Bowel sounds positive.  Musculoskeletal: no clubbing / cyanosis. No joint deformity upper and lower extremities. Good ROM, no contractures. Normal muscle tone.  Skin: no rashes, lesions, ulcers. No induration Neurologic: CN 2-12 grossly intact. Sensation intact, DTR normal. Strength 4/5 in all 4 due to poor effort.Marland Kitchen  Psychiatric: Normal judgment and insight. Alert and oriented x 3. Normal mood.    Labs on Admission: I have personally reviewed the following labs and imaging studies  CBC: Recent Labs  Lab 11/08/18 1205  WBC 3.6*  NEUTROABS 2.2  HGB 13.1  HCT 40.3  MCV 90.2  PLT 119   Basic Metabolic Panel: Recent Labs  Lab 11/08/18 1205  NA 134*  K 4.7  CL 102  CO2 24  GLUCOSE 260*  BUN 12  CREATININE 0.95  CALCIUM 9.4   GFR: Estimated Creatinine Clearance: 82 mL/min (by C-G formula based on SCr of 0.95 mg/dL).   Liver Function Tests: Recent  Labs  Lab 11/08/18 1205  AST 37  ALT 40  ALKPHOS 82  BILITOT 0.9  PROT 7.4  ALBUMIN 3.8   Recent Labs  Lab 11/08/18 1205  LIPASE 46   Recent Labs  Lab 11/08/18 1326  AMMONIA 28   Coagulation Profile: Recent Labs  Lab 11/08/18 1205  INR 1.1   Cardiac Enzymes: Recent Labs  Lab 11/08/18 1205  TROPONINI <0.03   Urine analysis:    Component Value Date/Time   COLORURINE YELLOW 11/08/2018 1255   APPEARANCEUR CLEAR 11/08/2018 1255   LABSPEC 1.005 11/08/2018 1255   PHURINE 6.0 11/08/2018 1255   GLUCOSEU 50 (A) 11/08/2018 1255   HGBUR NEGATIVE 11/08/2018 1255   BILIRUBINUR NEGATIVE 11/08/2018 1255   BILIRUBINUR nega 02/12/2018 1147   [place] 11/08/2018 1255   PROTEINUR NEGATIVE 11/08/2018 1255   UROBILINOGEN 0.2 02/12/2018 1147   NITRITE NEGATIVE 11/08/2018 1255   LEUKOCYTESUR NEGATIVE 11/08/2018 1255    Recent Results (from the past 240 hour(s))  Culture, blood (Routine X 2)  w Reflex to ID Panel     Status: None (Preliminary result)   Collection Time: 11/08/18  1:19 PM  Result Value Ref Range Status   Specimen Description   Final    BLOOD RIGHT ARM BOTTLES DRAWN AEROBIC AND ANAEROBIC   Special Requests   Final    Blood Culture adequate volume Performed at [facility], [address], [place], [place] [address]    Culture PENDING  Incomplete   Report Status PENDING  Incomplete  Culture, blood (Routine X 2) w Reflex to ID Panel     Status: None (Preliminary result)   Collection Time: 11/08/18  2:06 PM  Result Value Ref Range Status   Specimen Description   Final    BLOOD RIGHT ARM BOTTLES DRAWN AEROBIC AND ANAEROBIC   Special Requests   Final    Blood Culture results may not be optimal due to an inadequate volume of blood received in culture bottles Performed at [facility], [address], [place], [place] [address]    Culture PENDING  Incomplete   Report Status PENDING  Incomplete     Radiological Exams on Admission: Ct Head Wo Contrast   Result Date: 11/08/2018 CLINICAL DATA:  Dizziness for 3 weeks. EXAM: CT HEAD WITHOUT CONTRAST TECHNIQUE: Contiguous axial images were obtained from the base of the skull through the vertex without intravenous contrast. COMPARISON:  August 05, 2018 FINDINGS: Brain: No subdural, epidural, or subarachnoid hemorrhage. Scattered cerebellar infarcts are stable. No acute infarcts noted. Brainstem and basal cisterns are unchanged. Ventricles and sulci are prominent but stable. No mass effect or midline shift. A lacunar infarct in the left caudate is stable. No acute cortical ischemia or infarct. Vascular: No hyperdense vessel or unexpected calcification. Skull: Normal. Negative for fracture or focal lesion. Sinuses/Orbits: No acute finding. Other: None. IMPRESSION: 1. No acute intracranial abnormalities. Chronic infarcts in the cerebellar hemispheres and the left caudate. Electronically Signed   By: Dorise Bullion III M.D   On: 11/08/2018 13:51   Dg Chest Port 1 View  Result Date: 11/08/2018 CLINICAL DATA:  Hypotension.  Altered mental status. EXAM: PORTABLE CHEST 1 VIEW COMPARISON:  None. FINDINGS: The heart size and mediastinal contours are within normal limits. Both lungs are clear. The visualized skeletal structures are unremarkable. IMPRESSION: No active disease. Electronically Signed   By: Dorise Bullion III M.D   On: 11/08/2018 13:55    EKG: Biventricular hypertrophy by voltage, no acute ischemic changes appreciated.  Normal sinus rhythm; normal QT.  Assessment/Plan 1-dizziness: In the setting of chronic diuretic usage and recent initiation of Zanaflex for chronic muscle cramps. -Discontinue Zanaflex -Overnight stop diuretics and provide fluid resuscitation -Check orthostatic vital signs in a.m. -Patient has significant improvement in his orbital swelling has not really developed any further ascites; his diuretic dosages needs to be adjusted at discharge. -Will check cortisol level, B12 and TSH.  -PRN meclizine has been ordered; given the fact of chronic infarcts appreciated on CT head  2-ALC (alcoholic liver cirrhosis) ([place]) -Patient sober for 2 years now -Continue Xifaxan -Low-sodium diet -Currently adjusted dose of diuretics. -Outpatient follow-up with gastroenterologist as previously scheduled.  3-type 2 diabetes mellitus -Follow by diet control -Will check A1c -While inpatient will use a sliding scale insulin.  4-hyperlipidemia -Continue statins.  5-CVA (cerebral vascular accident) [facility]) -Ischemic infarcts appreciated on CT head (as far as January 2020). -Left parietal: Cerebellar infarct -Questionable chronic dizziness associated with previous chronic stroke -Continue low-dose aspirin and statins for secondary prevention -Will  initiate as needed meclizine to assist with dizziness. -No other focal deficits or complaints appreciated.  6-vitamin D deficiency -Continue at discharge weekly high-dose vitamin D supplementation.  DVT prophylaxis: SCDs Code Status: Full code Family Communication: No family at bedside Disposition Plan: Anticipate discharge back home in the next 24 hours. Consults called: None Admission status: Observation, length of stay less than 2 midnights; MedSurg.   Time Spent: 65 minutes.  Barton Dubois MD [facility] Pager [phone]  11/08/2018, 5:22 PM

## 2018-11-09 ENCOUNTER — Telehealth: Payer: Self-pay | Admitting: *Deleted

## 2018-11-09 DIAGNOSIS — R188 Other ascites: Principal | ICD-10-CM

## 2018-11-09 DIAGNOSIS — R42 Dizziness and giddiness: Secondary | ICD-10-CM | POA: Diagnosis not present

## 2018-11-09 DIAGNOSIS — K7460 Unspecified cirrhosis of liver: Secondary | ICD-10-CM

## 2018-11-09 LAB — BASIC METABOLIC PANEL
Anion gap: 5 (ref 5–15)
BUN: 13 mg/dL (ref 6–20)
CO2: 24 mmol/L (ref 22–32)
Calcium: 8.6 mg/dL — ABNORMAL LOW (ref 8.9–10.3)
Chloride: 110 mmol/L (ref 98–111)
Creatinine, Ser: 0.77 mg/dL (ref 0.61–1.24)
GFR calc Af Amer: >60 mL/min (ref >60)
GFR calc non Af Amer: >60 mL/min (ref >60)
Glucose, Bld: 120 mg/dL — ABNORMAL HIGH (ref 70–99)
Potassium: 3.6 mmol/L (ref 3.5–5.1)
Sodium: 139 mmol/L (ref 135–145)

## 2018-11-09 LAB — GLUCOSE, CAPILLARY
Glucose-Capillary: 124 mg/dL — ABNORMAL HIGH (ref 70–99)
Glucose-Capillary: 213 mg/dL — ABNORMAL HIGH (ref 70–99)

## 2018-11-09 MED ORDER — RIFAXIMIN 550 MG PO TABS: 550.0000 mg | ORAL_TABLET | Freq: Two times a day (BID) | ORAL | 3 refills | Status: DC

## 2018-11-09 MED ORDER — ACETAMINOPHEN 325 MG PO TABS
650.0000 mg | ORAL_TABLET | Freq: Four times a day (QID) | ORAL | Status: DC | PRN
  Administered 2018-11-09: 650 mg via ORAL
  Filled 2018-11-09: qty 2

## 2018-11-09 MED ORDER — SPIRONOLACTONE 25 MG PO TABS: ORAL_TABLET | ORAL | 0 refills | Status: DC

## 2018-11-09 MED ORDER — ATORVASTATIN CALCIUM 10 MG PO TABS: 10.0000 mg | ORAL_TABLET | Freq: Every day | ORAL | 11 refills | Status: DC

## 2018-11-09 MED ORDER — SPIRONOLACTONE 50 MG PO TABS: ORAL_TABLET | ORAL | 5 refills | Status: DC

## 2018-11-09 NOTE — Progress Notes (Signed)
Inpatient Diabetes Program Recommendations  AACE/ADA: New Consensus Statement on Inpatient Glycemic Control  Target Ranges:  Prepandial:   less than 140 mg/dL      Peak postprandial:   less than 180 mg/dL (1-2 hours)      Critically ill patients:  140 - 180 mg/dL  Results for [facility], Thomas "RUDY" (MRN [id number]) as of 11/09/2018 12:08  Ref. Range 11/08/2018 18:03 11/08/2018 21:03 11/09/2018 07:48 11/09/2018 11:33  Glucose-Capillary Latest Ref Range: 70 - 99 mg/dL 136 (H) 171 (H) 124 (H) 213 (H)  Results for [name], [name] (MRN [id number]) as of 11/09/2018 12:08  Ref. Range 11/08/2018 14:01  Hemoglobin A1C Latest Ref Range: 4.8 - 5.6 % 9.6 (H)  Results for [name], [name] (MRN [id number]) as of 11/09/2018 12:08  Ref. Range 05/18/2018 10:01  HB A1C (BAYER DCA - WAIVED) Latest Ref Range: <7.0 % 6.2    Review of Glycemic Control  Diabetes history: DM2 Outpatient Diabetes medications: None Current orders for Inpatient glycemic control: Novolog 0-9 units TID with meals  Inpatient Diabetes Program Recommendations:   HgbA1C: A1C 9.6% on 11/08/18 indicating an average glucose of 229 mg/dl. Patient will need to be discharged on DM medication and is open to taking DM medication at home and monitoring glucose.  NOTE: Spoke with patient over the phone about diabetes and home regimen for diabetes control. Patient reports that he is followed by PCP for diabetes management and currently he does not take any medication for DM. Patient states that he was started on a pill for DM in the past and his doctor told him to stop it. Patient does not recall the name of the oral DM medication.  Patient does not check glucose at home and does not have a glucometer or testing supplies at home. Discussed A1C results (9.6% on 11/08/18 and explained that his current A1C indicates an average glucose of 229 mg/dl over the past 2-3 months. Discussed glucose and A1C goals. Discussed importance of checking  CBGs and maintaining good CBG control to prevent long-term and short-term complications.   Discussed impact of nutrition, exercise, stress, sickness, and medications on diabetes control. Noted patient was prescribed Prednisone 5 day course on 08/10/18. Patient denies any other steroid use since then. Explained that he may need to start checking glucose at home and take DM medication for DM control. Patient states that he is agreeable to check glucose and take DM medication at home if MD prescribes. Encouraged patient to follow up with PCP regarding DM control and follow recommendations of the discharging doctor for DM management. Patient verbalized understanding of information discussed and he states that he has no further questions at this time related to diabetes.  At time of discharge, please provide prescription for: glucose monitoring kit (#[id number]).  Thanks, Barnie Alderman, RN, MSN, CDE Diabetes Coordinator Inpatient Diabetes Program [phone] (Team Pager)

## 2018-11-09 NOTE — Telephone Encounter (Signed)
Pt is requesting a refill on his Spironolactone.  [facility]

## 2018-11-09 NOTE — Discharge Summary (Signed)
Physician Discharge Summary  Thomas Pratt NGE:[id number] DOB: 05-16-1961 DOA: 11/08/2018  PCP: Sharion Balloon, FNP  Admit date: 11/08/2018 Discharge date: 11/09/2018  Time spent: 25 minutes  Recommendations for Outpatient Follow-up:  1. Needs outpatient vestibular rehab to be set up by case manager 2. Cut back numerous diuretics in terms of dosing-needs follow-up with FNP and GI going forward--- also I am not sure if there is very strong evidence for rifaximin but will defer to GI 3. Recommend Chem-12 CBC 1 week 4. Recommend strict adherence to low-salt diet  Discharge Diagnoses:  Principal Problem:   Dizziness Active Problems:   ALC (alcoholic liver cirrhosis) ([facility])   History of ETOH abuse   CVA (cerebral vascular accident) [facility])   Discharge Condition: Improved  Diet recommendation: Less than 2 g salt diet  Filed Weights   11/08/18 1156  Weight: 81.6 kg    History of present illness:  58 year old male type 2 diabetes mellitus prior stroke cirrhosis secondary to EtOH reflux and grade 1 varices admitted with dizziness and malaise Seem to pass out Blood pressure is low in the 70s in the emergency room Note that he had been placed on numerous diuretics recently he is unclear of his dosing and does not remember the names of some of his meds  Patient was observed overnight noted that his blood pressure rebounded well although he was orthostatic blood pressures dropping from 1 24-100 with positional changes systolic  He also seem to have some evidence of vestibular dysfunction so therapy assessed him and felt he would need outpatient therapy  I have cautioned him strongly about low-salt diet, discontinuation of meclizine and he will need to cut back his diuretics completely and follow-up with his primary care physician and gastroenterology in the outpatient setting  He is stabilized for discharge and does not need any other follow-up other than an adjustment in his  Aldactone   Discharge Exam: Vitals:   11/09/18 1041 11/09/18 1045  BP: 124/71 121/71  Pulse: 70 75  Resp:    Temp:    SpO2: 97%     General: Awake alert pleasant, Dix-Hallpike maneuver is positive Cardiovascular: S1-S2 no murmur rub or gallop Respiratory: Clinically clear no added sound  Discharge Instructions   Discharge Instructions    Diet - low sodium heart healthy   Complete by:  As directed    Discharge instructions   Complete by:  As directed    You will notice that the doses of your diuretics have changed and I feel that you have multiple components to your dizziness As we discussed I think you have a low blood pressure and you also have dizziness when you move your head from side to side and you will need outpatient physical therapy to help with this and I will set this up for you Please do not take Lasix and make sure you take 25 mg of Aldactone until you see your regular doctor again you need to be on a very low salt diet to assist with getting fluid off of you the medications alone will not help good luck   Increase activity slowly   Complete by:  As directed      Allergies as of 11/09/2018   No Known Allergies     Medication List    STOP taking these medications   furosemide 40 MG tablet Commonly known as:  LASIX     TAKE these medications   atorvastatin 10 MG tablet Commonly known as:  Lipitor  Take 1 tablet (10 mg total) by mouth daily.   dexlansoprazole 60 MG capsule Commonly known as:  Dexilant Take 1 capsule (60 mg total) by mouth daily.   rifaximin 550 MG Tabs tablet Commonly known as:  XIFAXAN Take 1 tablet (550 mg total) by mouth 2 (two) times daily.   spironolactone 25 MG tablet Commonly known as:  ALDACTONE Take 1 tablet po bid What changed:  medication strength   VITAMIN D PO Take by mouth daily. Unsure of strength      No Known Allergies    The results of significant diagnostics from this hospitalization (including imaging,  microbiology, ancillary and laboratory) are listed below for reference.    Significant Diagnostic Studies: Ct Head Wo Contrast  Result Date: 11/08/2018 CLINICAL DATA:  Dizziness for 3 weeks. EXAM: CT HEAD WITHOUT CONTRAST TECHNIQUE: Contiguous axial images were obtained from the base of the skull through the vertex without intravenous contrast. COMPARISON:  August 05, 2018 FINDINGS: Brain: No subdural, epidural, or subarachnoid hemorrhage. Scattered cerebellar infarcts are stable. No acute infarcts noted. Brainstem and basal cisterns are unchanged. Ventricles and sulci are prominent but stable. No mass effect or midline shift. A lacunar infarct in the left caudate is stable. No acute cortical ischemia or infarct. Vascular: No hyperdense vessel or unexpected calcification. Skull: Normal. Negative for fracture or focal lesion. Sinuses/Orbits: No acute finding. Other: None. IMPRESSION: 1. No acute intracranial abnormalities. Chronic infarcts in the cerebellar hemispheres and the left caudate. Electronically Signed   By: Dorise Bullion III M.D   On: 11/08/2018 13:51   Dg Chest Port 1 View  Result Date: 11/08/2018 CLINICAL DATA:  Hypotension.  Altered mental status. EXAM: PORTABLE CHEST 1 VIEW COMPARISON:  None. FINDINGS: The heart size and mediastinal contours are within normal limits. Both lungs are clear. The visualized skeletal structures are unremarkable. IMPRESSION: No active disease. Electronically Signed   By: Dorise Bullion III M.D   On: 11/08/2018 13:55    Microbiology: Recent Results (from the past 240 hour(s))  Culture, blood (Routine X 2) w Reflex to ID Panel     Status: None (Preliminary result)   Collection Time: 11/08/18  1:19 PM  Result Value Ref Range Status   Specimen Description   Final    BLOOD RIGHT ARM BOTTLES DRAWN AEROBIC AND ANAEROBIC   Special Requests Blood Culture adequate volume  Final   Culture   Final    NO GROWTH < 24 HOURS Performed at [facility], [address], [place], [place] [address]    Report Status PENDING  Incomplete  Culture, blood (Routine X 2) w Reflex to ID Panel     Status: None (Preliminary result)   Collection Time: 11/08/18  2:06 PM  Result Value Ref Range Status   Specimen Description   Final    BLOOD RIGHT ARM BOTTLES DRAWN AEROBIC AND ANAEROBIC   Special Requests   Final    Blood Culture results may not be optimal due to an inadequate volume of blood received in culture bottles   Culture   Final    NO GROWTH < 24 HOURS Performed at [facility], [address] [place], [place] [address]    Report Status PENDING  Incomplete     Labs: Basic Metabolic Panel: Recent Labs  Lab 11/08/18 1205 11/08/18 1814 11/09/18 0500  NA 134*  --  139  K 4.7  --  3.6  CL 102  --  110  CO2 24  --  24  GLUCOSE 260*  --  120*  BUN 12  --  13  CREATININE 0.95  --  0.77  CALCIUM 9.4  --  8.6*  MG  --  1.8  --   PHOS  --  3.2  --    Liver Function Tests: Recent Labs  Lab 11/08/18 1205  AST 37  ALT 40  ALKPHOS 82  BILITOT 0.9  PROT 7.4  ALBUMIN 3.8   Recent Labs  Lab 11/08/18 1205  LIPASE 46   Recent Labs  Lab 11/08/18 1326  AMMONIA 28   CBC: Recent Labs  Lab 11/08/18 1205  WBC 3.6*  NEUTROABS 2.2  HGB 13.1  HCT 40.3  MCV 90.2  PLT 164   Cardiac Enzymes: Recent Labs  Lab 11/08/18 1205  TROPONINI <0.03   BNP: BNP (last 3 results) Recent Labs    11/08/18 1205  BNP 21.0    ProBNP (last 3 results) No results for input(s): PROBNP in the last 8760 hours.  CBG: Recent Labs  Lab 11/08/18 1803 11/08/18 2103 11/09/18 0748 11/09/18 1133  GLUCAP 136* 171* 124* 213*       Signed:  Nita Sells MD   [facility] 11/09/2018, 1:50 PM

## 2018-11-09 NOTE — Progress Notes (Signed)
IV removed and DC instructions reviewed

## 2018-11-09 NOTE — Care Management Obs Status (Signed)
[place] NOTIFICATION   Patient Details  Name: Thomas Pratt MRN: [id number] Date of Birth: 1961/01/10   Medicare Observation Status Notification Given:  Yes    [name] 11/09/2018, 1:50 PM

## 2018-11-09 NOTE — Telephone Encounter (Signed)
Rx sent 

## 2018-11-09 NOTE — Evaluation (Signed)
Physical Therapy Evaluation Patient Details Name: Thomas Pratt MRN: [id number] DOB: 02/23/1961 Today's Date: 11/09/2018   History of Present Illness  Thomas Pratt is a 58 y.o. male with a past medical history significant for type 2 diabetes mellitus, prior history of stroke, history of cirrhosis secondary to alcohol abuse (currently 2 years without drinking), gastroesophageal reflux disease and grade 1 esophageal varices (2018); who presented to the emergency department secondary to dizziness and not feeling well.  Patient reports symptom has been going on on and off for the last 3 weeks but worsened in the last 24 hours prior to admission.  Patient rated felt as a almost passing out when changing positions.  He denies any fever, chills, chest pain, shortness of breath, cough, sick contacts, hematuria, dysuria, melena, hematochezia, hematemesis, focal weakness or any other complaints.    Clinical Impression  Patient presents seated at bedside with c/o mild headache and occasional dizziness when sitting up.  Patient states he was taking a medication, possibly a pain medication that made him feel nauseous and dizzy and stopped taking it a week ago.  Patient received meclizine yesterday evening per chart as reviewed by RN.  Patient negative for nystagmus in both right and left Hallpike-Dix maneuvers, only c/o mild dizziness when coming from supine to sitting and had no loss of balance ambulating in room and hallways.  Patient encouraged to keep track of medications he is taking and if dizziness persist after ruling out medications as source of dizziness, then follow up in an out patient physical therapy clinic to be assessed for possible vertigo.  Patient informed that he has to be off meclizine or any other medication for dizziness for at least 3-4 days to be properly assessed for BPPV.    Follow Up Recommendations No PT follow up    Equipment Recommendations  None recommended by PT     Recommendations for Other Services       Precautions / Restrictions Precautions Precautions: None Restrictions Weight Bearing Restrictions: No      Mobility  Bed Mobility Overal bed mobility: Independent                Transfers Overall transfer level: Independent                  Ambulation/Gait Ambulation/Gait assistance: Independent Gait Distance (Feet): 150 Feet Assistive device: None Gait Pattern/deviations: WFL(Within Functional Limits) Gait velocity: normal   General Gait Details: demonstrates good return for ambulation on level, inclined, and declined surfaces without loss of balance  Stairs            Wheelchair Mobility    Modified Rankin (Stroke Patients Only)       Balance Overall balance assessment: No apparent balance deficits (not formally assessed)                                           Pertinent Vitals/Pain Pain Assessment: No/denies pain    Home Living Family/patient expects to be discharged to:: Private residence Living Arrangements: Non-relatives/Friends Available Help at Discharge: Family Type of Home: Mobile home Home Access: Stairs to enter Entrance Stairs-Rails: None Entrance Stairs-Number of Steps: 3 Home Layout: One level Home Equipment: None      Prior Function Level of Independence: Independent               Hand Dominance  Extremity/Trunk Assessment   Upper Extremity Assessment Upper Extremity Assessment: Overall WFL for tasks assessed    Lower Extremity Assessment Lower Extremity Assessment: Overall WFL for tasks assessed    Cervical / Trunk Assessment Cervical / Trunk Assessment: Normal  Communication   Communication: No difficulties  Cognition Arousal/Alertness: Awake/alert Behavior During Therapy: WFL for tasks assessed/performed Overall Cognitive Status: Within Functional Limits for tasks assessed                                         General Comments      Exercises     Assessment/Plan    PT Assessment Patent does not need any further PT services  PT Problem List         PT Treatment Interventions      PT Goals (Current goals can be found in the Care Plan section)  Acute Rehab PT Goals Patient Stated Goal: return home PT Goal Formulation: With patient Time For Goal Achievement: 11/09/18 Potential to Achieve Goals: Good    Frequency     Barriers to discharge        Co-evaluation               AM-PAC PT "6 Clicks" Mobility  Outcome Measure Help needed turning from your back to your side while in a flat bed without using bedrails?: None Help needed moving from lying on your back to sitting on the side of a flat bed without using bedrails?: None Help needed moving to and from a bed to a chair (including a wheelchair)?: None Help needed standing up from a chair using your arms (e.g., wheelchair or bedside chair)?: None Help needed to walk in hospital room?: None Help needed climbing 3-5 steps with a railing? : None 6 Click Score: 24    End of Session   Activity Tolerance: Patient tolerated treatment well Patient left: in bed(seated at bedside) Nurse Communication: Mobility status PT Visit Diagnosis: Unsteadiness on feet (R26.81);Dizziness and giddiness (R42);Other abnormalities of gait and mobility (R26.89)    Time: 1130-1153 PT Time Calculation (min) (ACUTE ONLY): 23 min   Charges:   PT Evaluation $PT Eval Moderate Complexity: 1 Mod PT Treatments $Therapeutic Activity: 23-37 mins        12:32 PM, 11/09/18 Lonell Grandchild, MPT Physical Therapist with [facility] [phone] office [phone] mobile phone

## 2018-11-10 LAB — HIV ANTIBODY (ROUTINE TESTING W REFLEX): HIV Screen 4th Generation wRfx: NONREACTIVE

## 2018-11-13 LAB — CULTURE, BLOOD (ROUTINE X 2)
Culture: NO GROWTH
Culture: NO GROWTH
Special Requests: ADEQUATE

## 2018-11-16 ENCOUNTER — Other Ambulatory Visit (HOSPITAL_COMMUNITY)
Admission: RE | Admit: 2018-11-16 | Discharge: 2018-11-16 | Disposition: A | Discharge status: Home / Self Care | Payer: Medicare Other | Source: Ambulatory Visit | Attending: Nurse Practitioner | Admitting: Nurse Practitioner

## 2018-11-16 DIAGNOSIS — K7460 Unspecified cirrhosis of liver: Secondary | ICD-10-CM | POA: Diagnosis present

## 2018-11-16 LAB — COMPREHENSIVE METABOLIC PANEL
ALT: 41 U/L (ref 0–44)
AST: 40 U/L (ref 15–41)
Albumin: 4.1 g/dL (ref 3.5–5.0)
Alkaline Phosphatase: 91 U/L (ref 38–126)
Anion gap: 11 (ref 5–15)
BUN: 14 mg/dL (ref 6–20)
CO2: 22 mmol/L (ref 22–32)
Calcium: 9.6 mg/dL (ref 8.9–10.3)
Chloride: 99 mmol/L (ref 98–111)
Creatinine, Ser: 0.81 mg/dL (ref 0.61–1.24)
GFR calc Af Amer: >60 mL/min (ref >60)
GFR calc non Af Amer: >60 mL/min (ref >60)
Glucose, Bld: 221 mg/dL — ABNORMAL HIGH (ref 70–99)
Potassium: 4.5 mmol/L (ref 3.5–5.1)
Sodium: 132 mmol/L — ABNORMAL LOW (ref 135–145)
Total Bilirubin: 0.9 mg/dL (ref 0.3–1.2)
Total Protein: 7.7 g/dL (ref 6.5–8.1)

## 2018-11-17 ENCOUNTER — Telehealth: Payer: Self-pay | Admitting: Internal Medicine

## 2018-11-17 NOTE — Telephone Encounter (Signed)
Spoke with pt. He asked if I could call his insurance at [phone] because they no longer want to cover this medication. I will check to see what's covered under his plan.

## 2018-11-17 NOTE — Telephone Encounter (Signed)
Pt has questions about his prescription Xifaxan. Please call him at [phone]

## 2018-11-17 NOTE — Telephone Encounter (Signed)
I was talking with a representative with pts insurance company about pts medication. The call was disconnected. Will call insurance company back or start PA on [email or web address].

## 2018-11-24 NOTE — Telephone Encounter (Signed)
New PA form is being faxed over by [employer]. They aren't able to locate the PA done on [email or web address].

## 2018-12-02 NOTE — Telephone Encounter (Signed)
PA for Xifaxan 550 mg was approved. Pt is aware. New form received from [facility] Pt assistance for Dexilant 60 mg was filled out, signed by EG and faxed back so pt can continue medication.

## 2018-12-23 ENCOUNTER — Encounter: Payer: Self-pay | Admitting: Family

## 2018-12-23 ENCOUNTER — Ambulatory Visit (INDEPENDENT_AMBULATORY_CARE_PROVIDER_SITE_OTHER): Payer: Medicare Other | Admitting: Family

## 2018-12-23 ENCOUNTER — Other Ambulatory Visit: Payer: Self-pay

## 2018-12-23 DIAGNOSIS — J029 Acute pharyngitis, unspecified: Secondary | ICD-10-CM

## 2018-12-23 MED ORDER — AMOXICILLIN 875 MG PO TABS: 875.0000 mg | ORAL_TABLET | Freq: Two times a day (BID) | ORAL | 0 refills | Status: DC

## 2018-12-23 NOTE — Progress Notes (Signed)
   Virtual Visit via telephone Note  I connected with Thomas Pratt on 12/23/18 at 1:08 pm by telephone and verified that I am speaking with the correct person using two identifiers. Thomas Pratt is currently located at home and no one is currently with her during visit. The provider, Evelina Dun, FNP is located in their office at time of visit.  I discussed the limitations, risks, security and privacy concerns of performing an evaluation and management service by telephone and the availability of in person appointments. I also discussed with the patient that there may be a patient responsible charge related to this service. The patient expressed understanding and agreed to proceed.   History and Present Illness:   Sore Throat   This is a new problem. The current episode started in the past 7 days. The problem has been unchanged. There has been no fever. The pain is at a severity of 7/10. The pain is mild. Associated symptoms include ear pain (left), headaches, a hoarse voice and trouble swallowing. Pertinent negatives include no congestion, coughing, ear discharge or shortness of breath. He has tried gargles and NSAIDs for the symptoms. The treatment provided mild relief.      Review of Systems  HENT: Positive for ear pain (left), hoarse voice and trouble swallowing. Negative for congestion and ear discharge.   Respiratory: Negative for cough and shortness of breath.   Neurological: Positive for headaches.  All other systems reviewed and are negative.    Observations/Objective: No SOB or distress   Assessment and Plan: 1. Acute pharyngitis, unspecified etiology - Take meds as prescribed - Use a cool mist humidifier  -Use saline nose sprays frequently -Force fluids  -For fever or aces or pains- take tylenol or ibuprofen. -Throat lozenges if help -New toothbrush in 3 days RTO if symptoms worsen or do not improve  - amoxicillin (AMOXIL) 875 MG tablet; Take 1 tablet  (875 mg total) by mouth 2 (two) times daily.  Dispense: 14 tablet; Refill: 0     I discussed the assessment and treatment plan with the patient. The patient was provided an opportunity to ask questions and all were answered. The patient agreed with the plan and demonstrated an understanding of the instructions.   The patient was advised to call back or seek an in-person evaluation if the symptoms worsen or if the condition fails to improve as anticipated.  The above assessment and management plan was discussed with the patient. The patient verbalized understanding of and has agreed to the management plan. Patient is aware to call the clinic if symptoms persist or worsen. Patient is aware when to return to the clinic for a follow-up visit. Patient educated on when it is appropriate to go to the emergency department.   Time call ended:  1:16 pm  I provided 8 minutes of non-face-to-face time during this encounter.    Evelina Dun, FNP

## 2019-03-01 ENCOUNTER — Encounter: Payer: Self-pay | Admitting: Nurse Practitioner

## 2019-03-01 ENCOUNTER — Ambulatory Visit (INDEPENDENT_AMBULATORY_CARE_PROVIDER_SITE_OTHER): Payer: Medicare Other | Admitting: Nurse Practitioner

## 2019-03-01 ENCOUNTER — Other Ambulatory Visit: Payer: Self-pay

## 2019-03-01 VITALS — BP 128/79 | HR 83 | Temp 97.3°F | Ht 68.0 in | Wt 159.6 lb

## 2019-03-01 DIAGNOSIS — R1310 Dysphagia, unspecified: Secondary | ICD-10-CM | POA: Insufficient documentation

## 2019-03-01 DIAGNOSIS — R1319 Other dysphagia: Secondary | ICD-10-CM

## 2019-03-01 DIAGNOSIS — K7030 Alcoholic cirrhosis of liver without ascites: Secondary | ICD-10-CM

## 2019-03-01 DIAGNOSIS — R600 Localized edema: Secondary | ICD-10-CM

## 2019-03-01 NOTE — Patient Instructions (Addendum)
Your health issues we discussed today were:   Cirrhosis with swelling in your legs: 1. As we discussed, try to get established with a 12-month counseling program.  This will allow you to have a transplant evaluation now, while you are healthy, should you need it urgently in the future 2. Counseling program will also help you get your license back 3. I have put in orders to recheck your labs and ultrasound 4. You do not have significant swelling right now so I will not make changes to your medications 5. Continue nadolol and Aldactone as well as Xifaxan 6. Return for follow-up in 6 months 7. Call us if you have any worsening symptoms before then  Swallowing difficulties: 1. Because of your intermittent swallowing difficulties we will plan an upper endoscopy with possible dilation 2. Further recommendations will be made after your procedure 3. Call us if you have any worsening or severe symptoms.  Overall I recommend:  1. Continue your other current medications 2. Return for follow-up in 6 months 3. Call us if you have any questions or concerns   Because of recent events of COVID-19 ("Coronavirus"), follow CDC recommendations:  1. Wash your hand frequently 2. Avoid touching your face 3. Stay away from people who are sick 4. If you have symptoms such as fever, cough, shortness of breath then call your healthcare provider for further guidance 5. If you are sick, STAY AT HOME unless otherwise directed by your healthcare provider. 6. Follow directions from state and national officials regarding staying safe   At [facility] we value your feedback. You may receive a survey about your visit today. Please share your experience as we strive to create trusting relationships with our patients to provide genuine, compassionate, quality care.  We appreciate your understanding and patience as we review any laboratory studies, imaging, and other diagnostic tests that are ordered as  we care for you. Our office policy is 5 business days for review of these results, and any emergent or urgent results are addressed in a timely manner for your best interest. If you do not hear from our office in 1 week, please contact us.   We also encourage the use of MyChart, which contains your medical information for your review as well. If you are not enrolled in this feature, an access code is on this after visit summary for your convenience. Thank you for allowing [place] to be involved in your care.  It was great to see you today!  I hope you have a great summer!!

## 2019-03-01 NOTE — Assessment & Plan Note (Signed)
History of alcoholic liver cirrhosis.  Patient has successfully abstained from alcohol for about 3 years.  He was sent to [facility] for transplant evaluation but they require a 83-month alcohol counseling program, despite his abstinence.  He has been trying to get into a program but they are mostly for recently.  He states he will call back when he gets home to try to get established.  He is also wanting to reobtain his driver's license due to his successful alcohol abstinence.  He is congratulated for his abstinence.  His liver function has been well compensated.  He does have grade 1 esophageal varices and is on nonselective beta-blockade prophylaxis.  Previously had significant edema and cirrhosis although this has improved with diuretics.  He recently was admitted to the hospital for hypotension and his Lasix was discontinued.  His lower extremity edema is minimal to none on Aldactone only.  Reinforced 2 g sodium diet.  At this point we will update his cirrhosis labs and right upper quadrant ultrasound for hepatoma screening.  We will not make any changes to his medications right now as it seems to be doing well and adding furosemide runs a risk of recurrent hypotension.  Return for follow-up in 6 months.

## 2019-03-01 NOTE — Assessment & Plan Note (Signed)
The patient describes recent onset of solid food dysphasia which occurs about every 2 or 3 days.  Typically occurs after his first couple bites.  No breakthrough GERD symptoms.  No melena, vomiting, hematemesis.  He has had some recent weight loss, although this could be due to diuresis and removal of fluid.  At this point we will proceed with an EGD with possible dilation related to his dysphasia.  Continue current medications, follow-up in 6 months.  Proceed with EGD +/- dilation with Dr. Gala Romney in near future: the risks, benefits, and alternatives have been discussed with the patient in detail. The patient states understanding and desires to proceed.  The patient is not on any anticoagulants, anxiolytics, chronic pain medications, or antidepressants.  No diabetes medications or iron supplements.  He does have a history of alcohol abuse although he has successfully abstained for the past 3 years.  Due to his history of alcohol abuse we will plan for the procedure on propofol/MAC to promote adequate sedation.

## 2019-03-01 NOTE — Assessment & Plan Note (Signed)
Previously with significant lower extremity edema and at 1 point ascites.  He was started on a diuretic regimen.  He was recently admitted for hypotension and his Lasix has been held.  He is currently on Aldactone 25 mg and has been doing well on this.  He has minimal to no lower extremity edema.  No appreciable ascites on exam today.  Recommend he continue his current medications and we will not re-add Lasix due to his episodic hypotension.  We may need to revisit this depending on any reaccumulation of edema or ascites.  Follow-up in 6 months.

## 2019-03-01 NOTE — Progress Notes (Signed)
Referring Provider: Sharion Balloon, FNP Primary Care Physician:  Sharion Balloon, FNP Primary GI:  Dr. Gala Romney  Chief Complaint  Patient presents with   Cirrhosis    HPI:   Thomas Pratt is a 58 y.o. male who presents for follow-up on abdominal pain and cirrhosis.  The patient was last seen in our office 08/28/2018 for the same as well as lower extremity edema.  Cirrhosis likely secondary to alcohol and has abstained from alcohol for the past 2 and half years.  History of positive ANA for presumed PBC as well as elevated alkaline phosphatase and he was subsequently started on Urso.  EGD and colonoscopy up-to-date 2018.  Previously on Lasix 40 mg and spironolactone 100 mg.  Has been seen by [facility] liver transplant program who recommended continue Corgard 40 mg daily and 20 mg in the afternoon, lactulose, rifaximin by local GI as well as diuretics and Urso but recommend no further Urso refills until liver biopsy returned.  Require 6 months of alcohol/substance abuse counseling prior to evaluation for transplant.  Previously recommended referral back to surgery for possible adhesion/scar tissue pain resulting in his abdominal discomfort, establish primary care.  He did follow-up with surgery who had limited options and recommended avoiding exertion and strain in the right groin and notify of any worsening.  At his last visit he noted he was still struggling with intermittent headaches.  13 pound weight loss (subjective) over the past 2 or 3 weeks.  Objectively no weight loss noted.  Still with some lower extremity edema.  Abdominal pain persistent in mid abdomen to right lower quadrant/groin area which is crampy and relieved a bit by laying down.  Denies overt hepatic symptoms.  Has not been sleeping well recently, ran out of rifaximin 3 weeks ago and needs a refill.  Stopped taking lactulose due to making him sick.  Has a bowel movement about 3 times a day.  He made a concerted effort to start  alcohol counseling program but they have been full and are supposed to call him within a month to get started.  He has a goal of reobtaining his driver's license at some point.  Has finally gotten on Medicare/disability which he is glad for.  Recommended updated labs and imaging, refill Xifaxan, persist with attempts for alcohol counseling program, continue vitamin D3.  Follow-up with primary care or surgeon related to abdominal pain, follow-up in 6 months.  Labs completed 09/09/2018 which found normal CBC (specifically platelets 158), normal INR and AFP.  His transaminases were mildly elevated at AST/ALT 49/47 although they have been normal for about the past year.  Recommended recheck in 3 months.  Recheck HFP on 11/08/2027 found completely normal labs.  Right upper quadrant ultrasound completed 09/09/2018 which found known ascites, status post cholecystectomy, no focal liver lesions.  Today he states he's doing well overall. The alcohol counseling program did not call him back. He has not had any alochol in about 3 years. He has seen some mild LE edema. Has been starting to have some recurrent lower abdominal pain. Has been having regular bowel movements 3-4 a day. Has also have some solid food dysphagia which occurs after a few bites, about 2-3 times a week. Denies GERD symptoms. Denies N/V, fever, chills, hematochezia, melena. He has had about 20 lbs weight loss in the past 6 months, although his LE edema is improved. Has had some blurred vision and headaches. Headaches feels like they're worse when his vision [place]  blurred. Has not had his eyes checked recently. Denies yellowing of skin/eyes, darkened urine, acute episodic confusion, tremors, generalized pruritis. Denies URI or flu-like symptoms. Denies loss of sense of taste or smell. Denies chest pain, dyspnea, dizziness, lightheadedness, syncope, near syncope. Denies any other upper or lower GI symptoms.  Past Medical History:  Diagnosis Date    Cirrhosis ([place])    likely ETOH use     Past Surgical History:  Procedure Laterality Date   CHOLECYSTECTOMY N/A 09/01/2017   Procedure: LAPAROSCOPIC CHOLECYSTECTOMY;  Surgeon: Aviva Signs, MD;  Location: [facility];  Service: General;  Laterality: N/A;   COLONOSCOPY WITH PROPOFOL N/A 03/06/2017   Dr. Gala Romney: tubular adenoma removed, next TCS 5 years if health permits.    ESOPHAGOGASTRODUODENOSCOPY (EGD) WITH PROPOFOL N/A 03/06/2017   Dr. Gala Romney: grade 1 esophageal varices, erosive esophagitis, portal gastropathy.    HERNIA REPAIR Right    RIH   INGUINAL HERNIA REPAIR Right 07/11/2017   Procedure: HERNIA REPAIR INGUINAL ADULT WITH MESH;  Surgeon: Aviva Signs, MD;  Location: [facility];  Service: General;  Laterality: Right;  patient knows to arrive at 7:30   IR TRANSCATHETER BX  10/14/2017   IR US GUIDE VASC ACCESS RIGHT  10/14/2017   IR VENOGRAM HEPATIC WO HEMODYNAMIC EVALUATION  10/14/2017   POLYPECTOMY  03/06/2017   Procedure: POLYPECTOMY;  Surgeon: Daneil Dolin, MD;  Location: [facility];  Service: Endoscopy;;  ascending colon;    Current Outpatient Medications  Medication Sig Dispense Refill   atorvastatin (LIPITOR) 10 MG tablet Take 1 tablet (10 mg total) by mouth daily. 30 tablet 11   dexlansoprazole (DEXILANT) 60 MG capsule Take 1 capsule (60 mg total) by mouth daily. 90 capsule 3   rifaximin (XIFAXAN) 550 MG TABS tablet Take 1 tablet (550 mg total) by mouth 2 (two) times daily. (Patient taking differently: Take 550 mg by mouth daily. ) 180 tablet 3   spironolactone (ALDACTONE) 25 MG tablet Take 1 tablet po bid 30 tablet 0   VITAMIN D PO Take by mouth daily. Unsure of strength     No current facility-administered medications for this visit.     Allergies as of 03/01/2019   (No Known Allergies)    Family History  Problem Relation Age of Onset   COPD Mother    Liver disease Neg Hx    Colon cancer Neg Hx    Colon polyps Neg Hx     Social History    Socioeconomic History   Marital status: Divorced    Spouse name: Not on file   Number of children: Not on file   Years of education: Not on file   Highest education level: Not on file  Occupational History   Occupation: unemployed  Social Designer, fashion/clothing strain: Not on file   Food insecurity    Worry: Not on file    Inability: Not on file   Transportation needs    Medical: Not on file    Non-medical: Not on file  Tobacco Use   Smoking status: Never Smoker   Smokeless tobacco: Never Used  Substance and Sexual Activity   Alcohol use: No    Comment: Quit September 2017, heavy alcohol abuse previously   Drug use: No   Sexual activity: Not Currently    Partners: Female  Lifestyle   Physical activity    Days per week: Not on file    Minutes per session: Not on file   Stress: Not on file  Relationships   Social Herbalist on phone: Not on file    Gets together: Not on file    Attends religious service: Not on file    Active member of club or organization: Not on file    Attends meetings of clubs or organizations: Not on file    Relationship status: Not on file  Other Topics Concern   Not on file  Social History Narrative   Lives in [place], [place]. 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    Drank alcohol for years, quit 05/07/16. Has been sober since then. Quit b/c of health/cirrhosis.   Divorced for over 15 years.   Dating sporadically.   Has 3 daughters and 1 son live locally.   Originally from [place], moved here b/c of wife.    Eats all foods.    Wear seatbelt.   Does not drive, does not have a license, lost due to DWI.    Brought by neighbor.    Does not go to church. Believes in God. Prays each night, per report.     Review of Systems: General: Negative for anorexia, weight loss, fever, chills, fatigue, weakness. ENT: Negative for hoarseness, difficulty swallowing. CV: Negative for chest pain, angina,  palpitations, peripheral edema.  Respiratory: Negative for dyspnea at rest, cough, sputum, wheezing.  GI: See history of present illness. MS: Negative for joint pain, low back pain.  Derm: Negative for rash or itching.  Neuro: Negative for memory loss, confusion.   Endo: Negative for unusual weight change.  Heme: Negative for bruising or bleeding. Allergy: Negative for rash or hives.   Physical Exam: BP 128/79    Pulse 83    Temp (!) 97.3 F (36.3 C) (Temporal)    Ht 5\' 8"  (1.727 m)    Wt 159 lb 9.6 oz (72.4 kg)    BMI 24.27 kg/m  General:   Alert and oriented. Pleasant and cooperative. Well-nourished and well-developed.  Eyes:  Without icterus, sclera clear and conjunctiva pink.  Ears:  Normal auditory acuity. Cardiovascular:  S1, S2 present without murmurs appreciated. Extremities without clubbing or edema. Respiratory:  Clear to auscultation bilaterally. No wheezes, rales, or rhonchi. No distress.  Gastrointestinal:  +BS, soft, non-tender and non-distended. No HSM noted. No guarding or rebound. No masses appreciated.  Rectal:  Deferred  Musculoskalatal:  Symmetrical without gross deformities. Neurologic:  Alert and oriented x4;  grossly normal neurologically. Psych:  Alert and cooperative. Normal mood and affect. Heme/Lymph/Immune: No excessive bruising noted.    03/01/2019 9:52 AM   Disclaimer: This note was dictated with voice recognition software. Similar sounding words can inadvertently be transcribed and may not be corrected upon review.

## 2019-03-02 ENCOUNTER — Telehealth: Payer: Self-pay

## 2019-03-02 ENCOUNTER — Other Ambulatory Visit: Payer: Self-pay

## 2019-03-02 DIAGNOSIS — R1310 Dysphagia, unspecified: Secondary | ICD-10-CM

## 2019-03-02 DIAGNOSIS — R1319 Other dysphagia: Secondary | ICD-10-CM

## 2019-03-02 DIAGNOSIS — K7030 Alcoholic cirrhosis of liver without ascites: Secondary | ICD-10-CM

## 2019-03-02 NOTE — Telephone Encounter (Signed)
[place] abd RUQ scheduled for 03/10/19 at 8:30am, arrive at 8:15am. NPO after midnight before test. No PA needed for [place] per evicore website.  Called and informed pt of [place] appt. EGD-/+DIL w/Propofol w/RMR scheduled for 05/03/19 at 10:30am. Orders entered.

## 2019-03-03 NOTE — Telephone Encounter (Signed)
Pre-op appt 04/29/19 at 1:15pm. Letter mailed with procedure instructions.

## 2019-03-05 IMAGING — CT CT HEAD W/O CM
3 series · 16 of 47 positions shown, 19 images · non-contrast
Comparison: 04/17/2018

CLINICAL DATA: Dizziness, nausea

EXAM:
CT HEAD WITHOUT CONTRAST
TECHNIQUE: Contiguous axial images were obtained from the base of the skull
through the vertex without intravenous contrast.

[Series 2: head trauma wo · axial · 0.44mm/px · z∈[+48,+173]mm · 10 of 31 slices shown, 13 images]
[im 3/31  brain]
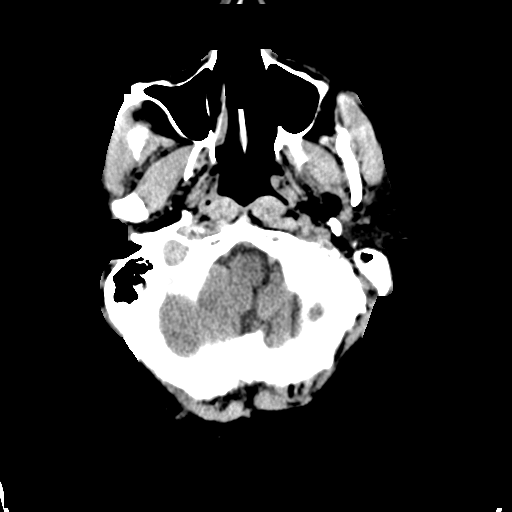
[im 3/31  bone]
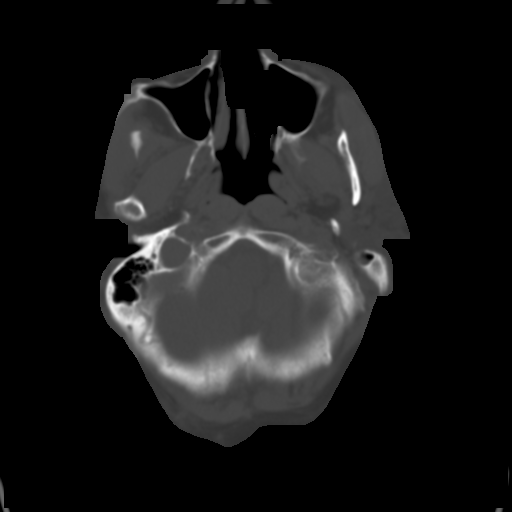
[im 6/31  brain]
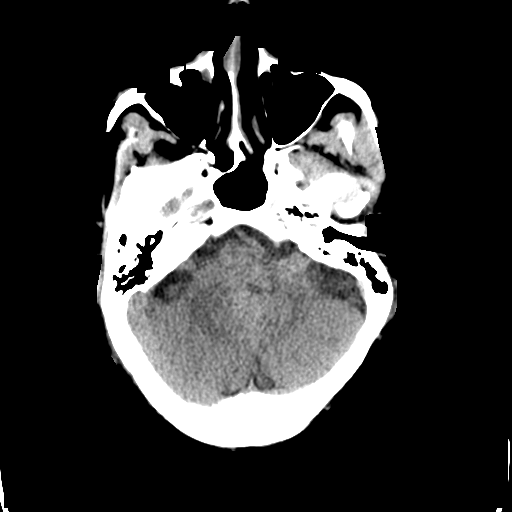
[im 9/31  brain]
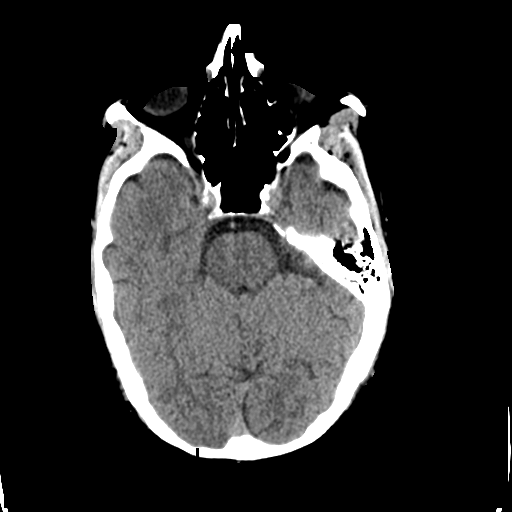
[im 11/31  brain]
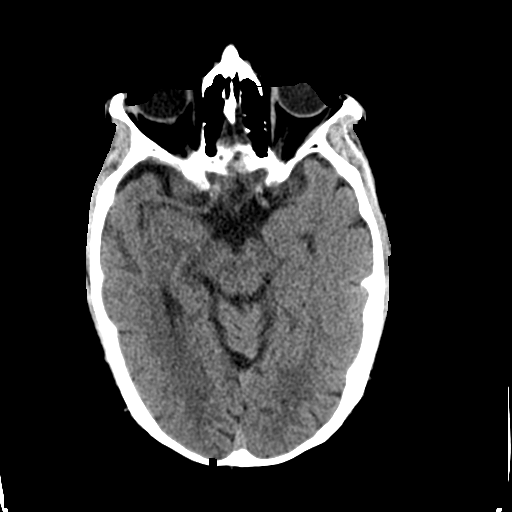
[im 14/31  brain]
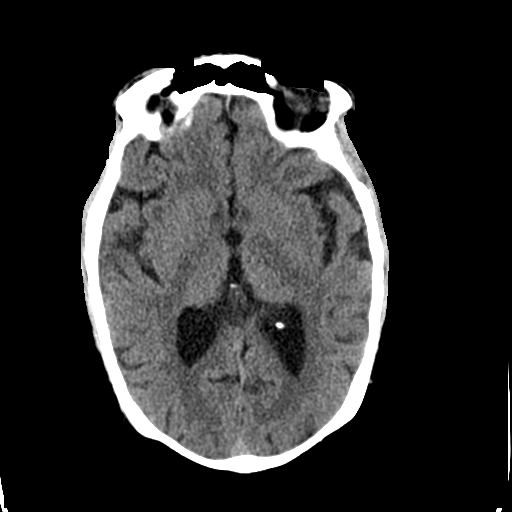
[im 14/31  bone]
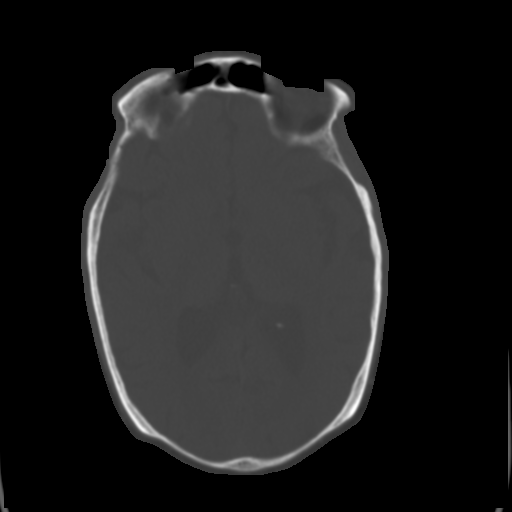
[im 17/31  brain]
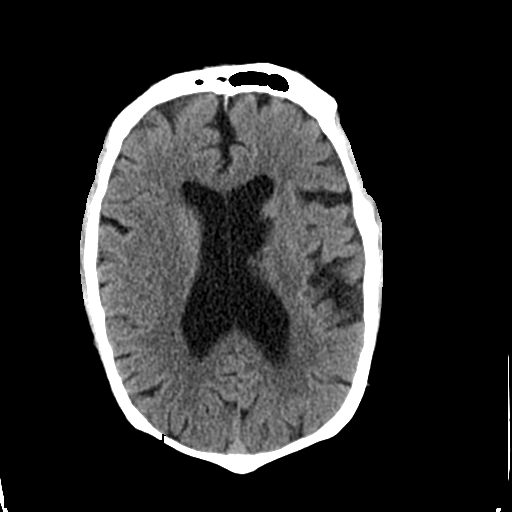
[im 20/31  brain]
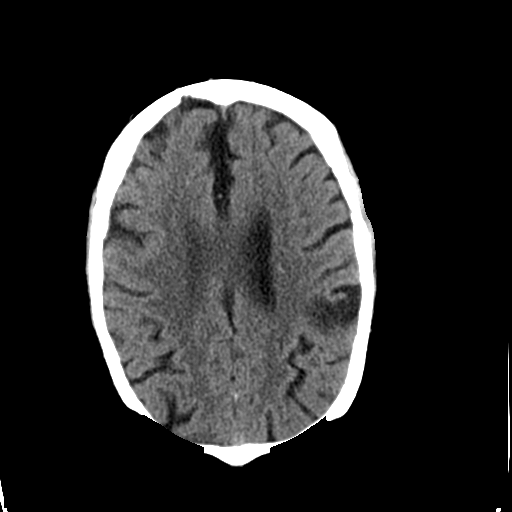
[im 23/31  brain]
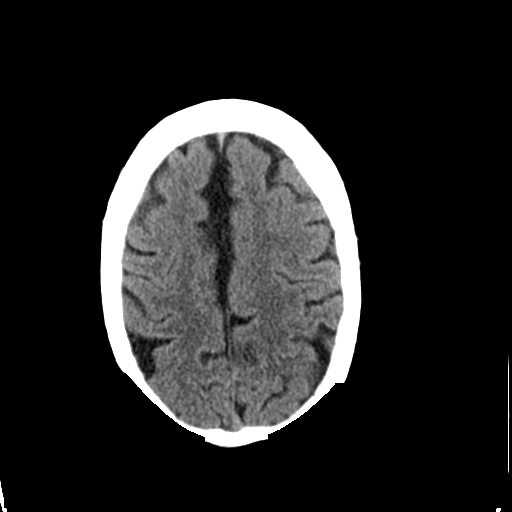
[im 25/31  brain]
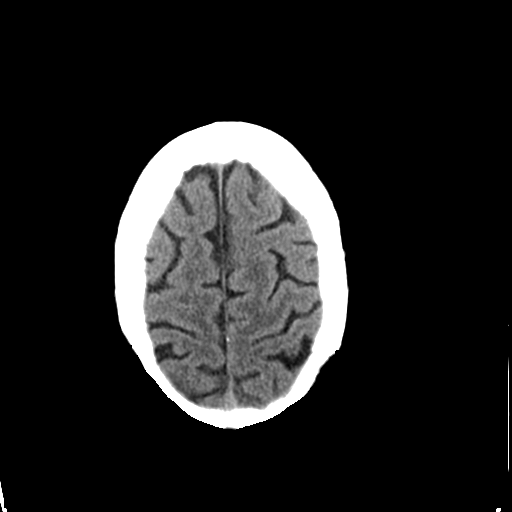
[im 25/31  bone]
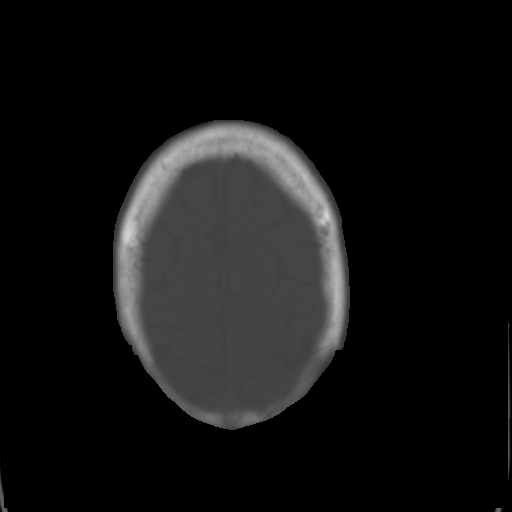
[im 28/31  brain]
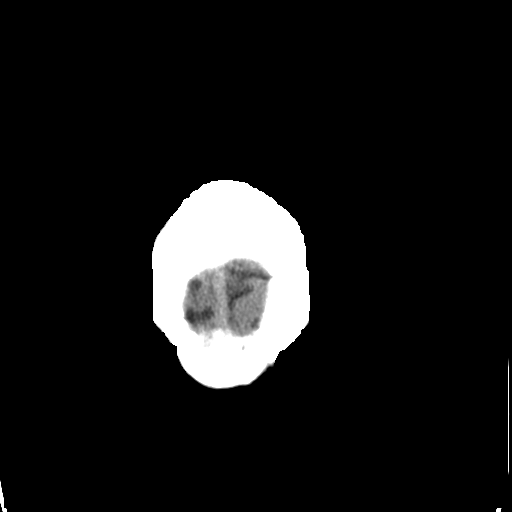

[Series 4: coronal soft tissue · coronal · 0.31mm/px · 3 of 69 slices shown]
[im 23/69  brain]
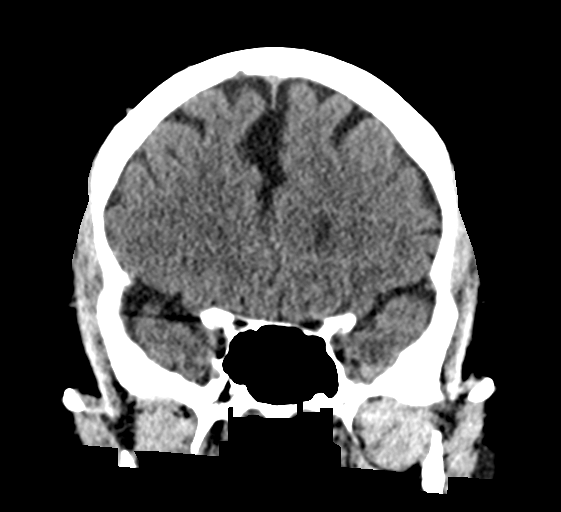
[im 31/69  brain]
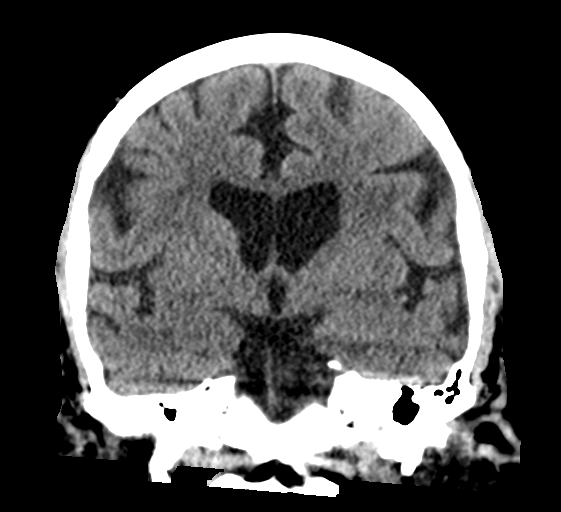
[im 38/69  brain]
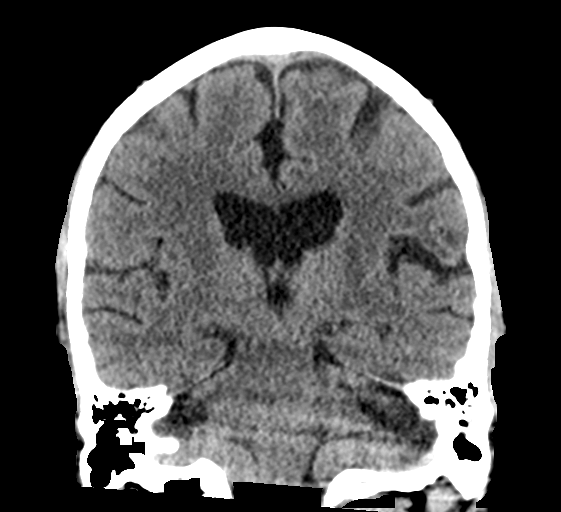

[Series 5: sagittal soft tissue · sagittal · 0.35mm/px · 3 of 53 slices shown]
[im 18/53  brain]
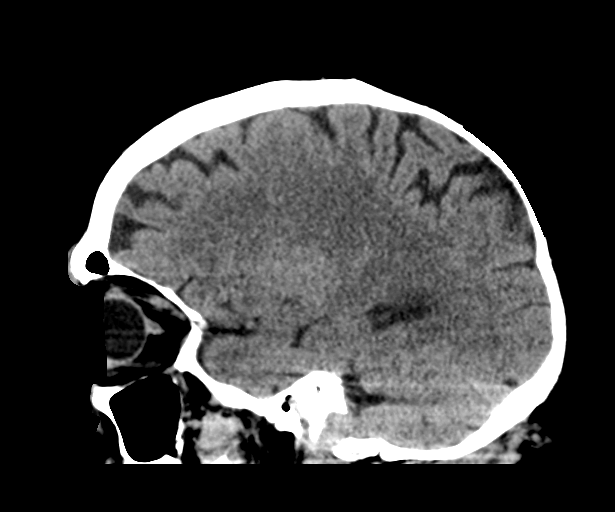
[im 27/53  brain]
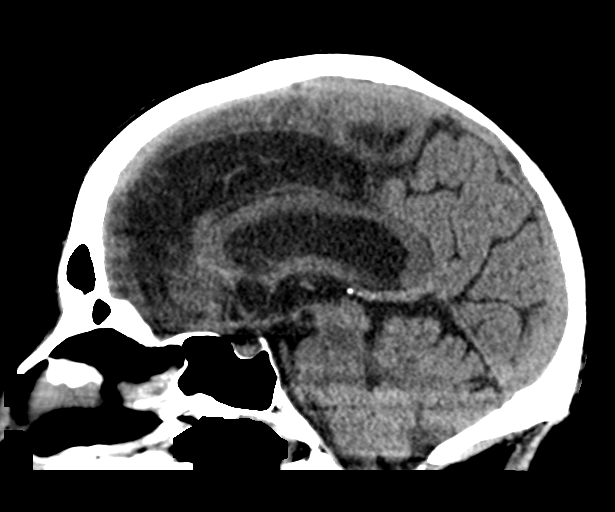
[im 35/53  brain]
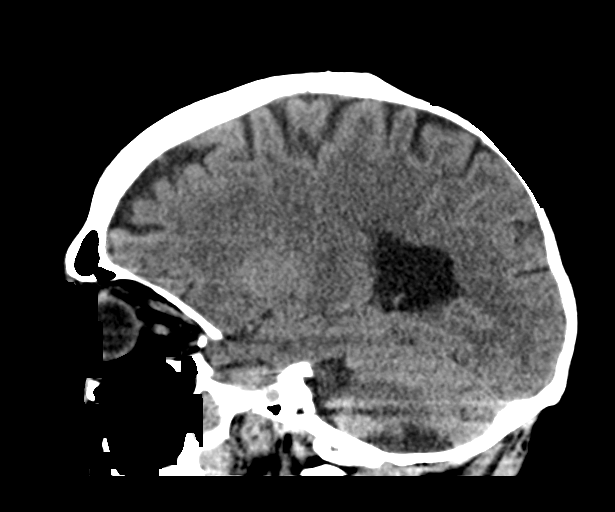

[16 of 47 positions shown; findings below may reference images not displayed]

FINDINGS: Brain: No evidence of acute infarction, hemorrhage, hydrocephalus,
extra-axial collection or mass lesion/mass effect.

Mild cortical atrophy.

Encephalomalacic changes in the left parietal region (series 2/image
20) and left cerebellum.

Vascular: Intracranial atherosclerosis.

Skull: Normal. Negative for fracture or focal lesion.

Sinuses/Orbits: The visualized paranasal sinuses are essentially
clear. The mastoid air cells are unopacified.

Other: None.
IMPRESSION: No evidence of acute intracranial abnormality.

Encephalomalacic changes related to prior left parietal and
cerebellar infarcts.

## 2019-03-10 ENCOUNTER — Ambulatory Visit (HOSPITAL_COMMUNITY)
Admission: RE | Admit: 2019-03-10 | Discharge: 2019-03-10 | Disposition: A | Discharge status: Home / Self Care | Payer: Medicare Other | Source: Ambulatory Visit | Attending: Nurse Practitioner | Admitting: Nurse Practitioner

## 2019-03-10 ENCOUNTER — Other Ambulatory Visit (HOSPITAL_COMMUNITY)
Admission: RE | Admit: 2019-03-10 | Discharge: 2019-03-10 | Disposition: A | Discharge status: Home / Self Care | Payer: Medicare Other | Source: Ambulatory Visit | Attending: Nurse Practitioner | Admitting: Nurse Practitioner

## 2019-03-10 ENCOUNTER — Other Ambulatory Visit: Payer: Self-pay

## 2019-03-10 ENCOUNTER — Telehealth: Payer: Self-pay

## 2019-03-10 DIAGNOSIS — R1310 Dysphagia, unspecified: Secondary | ICD-10-CM | POA: Diagnosis present

## 2019-03-10 DIAGNOSIS — K7030 Alcoholic cirrhosis of liver without ascites: Secondary | ICD-10-CM | POA: Insufficient documentation

## 2019-03-10 DIAGNOSIS — R600 Localized edema: Secondary | ICD-10-CM | POA: Diagnosis present

## 2019-03-10 DIAGNOSIS — R1319 Other dysphagia: Secondary | ICD-10-CM

## 2019-03-10 LAB — CBC WITH DIFFERENTIAL/PLATELET
Abs Immature Granulocytes: 0.02 10*3/uL (ref 0.00–0.07)
Basophils Absolute: 0 10*3/uL (ref 0.0–0.1)
Basophils Relative: 0 %
Eosinophils Absolute: 0.1 10*3/uL (ref 0.0–0.5)
Eosinophils Relative: 1 %
HCT: 45.7 % (ref 39.0–52.0)
Hemoglobin: 16 g/dL (ref 13.0–17.0)
Immature Granulocytes: 0 %
Lymphocytes Relative: 24 %
Lymphs Abs: 1.5 10*3/uL (ref 0.7–4.0)
MCH: 32.3 pg (ref 26.0–34.0)
MCHC: 35 g/dL (ref 30.0–36.0)
MCV: 92.3 fL (ref 80.0–100.0)
Monocytes Absolute: 0.3 10*3/uL (ref 0.1–1.0)
Monocytes Relative: 6 %
Neutro Abs: 4.1 10*3/uL (ref 1.7–7.7)
Neutrophils Relative %: 69 %
Platelets: 190 10*3/uL (ref 150–400)
RBC: 4.95 MIL/uL (ref 4.22–5.81)
RDW: 13.2 % (ref 11.5–15.5)
WBC: 6.1 10*3/uL (ref 4.0–10.5)
nRBC: 0 % (ref 0.0–0.2)

## 2019-03-10 LAB — COMPREHENSIVE METABOLIC PANEL
ALT: 54 U/L — ABNORMAL HIGH (ref 0–44)
AST: 43 U/L — ABNORMAL HIGH (ref 15–41)
Albumin: 4.6 g/dL (ref 3.5–5.0)
Alkaline Phosphatase: 91 U/L (ref 38–126)
Anion gap: 12 (ref 5–15)
BUN: 14 mg/dL (ref 6–20)
CO2: 26 mmol/L (ref 22–32)
Calcium: 10.1 mg/dL (ref 8.9–10.3)
Chloride: 94 mmol/L — ABNORMAL LOW (ref 98–111)
Creatinine, Ser: 0.93 mg/dL (ref 0.61–1.24)
GFR calc Af Amer: >60 mL/min (ref >60)
GFR calc non Af Amer: >60 mL/min (ref >60)
Glucose, Bld: 261 mg/dL — ABNORMAL HIGH (ref 70–99)
Potassium: 4.4 mmol/L (ref 3.5–5.1)
Sodium: 132 mmol/L — ABNORMAL LOW (ref 135–145)
Total Bilirubin: 1.3 mg/dL — ABNORMAL HIGH (ref 0.3–1.2)
Total Protein: 8.4 g/dL — ABNORMAL HIGH (ref 6.5–8.1)

## 2019-03-10 LAB — PROTIME-INR
INR: 1 (ref 0.8–1.2)
Prothrombin Time: 13.3 seconds (ref 11.4–15.2)

## 2019-03-10 NOTE — Telephone Encounter (Signed)
I haven't seen patient lately, has been seen repeatedly by EG.   Chart review shows furosemide D/C'd by hospitalist in 10/2018 when he was discharged from the hospital.   Will defer to provider who last saw the patient.

## 2019-03-10 NOTE — Telephone Encounter (Signed)
Received a refill request for Furosemide attached to LSL name. I called the pt and he isn't sure that he takes that medication. I didn't see this medication under pts medication list or in chart. Please advise. This medication request might've been sent in error.

## 2019-03-11 ENCOUNTER — Other Ambulatory Visit: Payer: Self-pay | Admitting: *Deleted

## 2019-03-11 LAB — AFP TUMOR MARKER: AFP, Serum, Tumor Marker: 2.2 ng/mL (ref 0.0–8.3)

## 2019-03-11 MED ORDER — DEXILANT 60 MG PO CPDR: 60.0000 mg | DELAYED_RELEASE_CAPSULE | Freq: Every day | ORAL | 0 refills | Status: DC

## 2019-03-12 NOTE — Telephone Encounter (Signed)
Must have been an error. At last visit was only on Aldactone and no edema issues. It appears it was d/c at discharge recently.  Do not need to take this.

## 2019-03-15 NOTE — Telephone Encounter (Signed)
Noted. Pt is aware that medication is an error.

## 2019-03-22 ENCOUNTER — Other Ambulatory Visit: Payer: Self-pay | Admitting: Nurse Practitioner

## 2019-03-22 DIAGNOSIS — K7460 Unspecified cirrhosis of liver: Secondary | ICD-10-CM

## 2019-03-31 ENCOUNTER — Other Ambulatory Visit: Payer: Self-pay

## 2019-03-31 DIAGNOSIS — K7460 Unspecified cirrhosis of liver: Secondary | ICD-10-CM

## 2019-04-13 ENCOUNTER — Other Ambulatory Visit (HOSPITAL_COMMUNITY)
Admission: RE | Admit: 2019-04-13 | Discharge: 2019-04-13 | Disposition: A | Discharge status: Home / Self Care | Payer: Medicare Other | Source: Ambulatory Visit | Attending: Nurse Practitioner | Admitting: Nurse Practitioner

## 2019-04-13 DIAGNOSIS — K7460 Unspecified cirrhosis of liver: Secondary | ICD-10-CM | POA: Insufficient documentation

## 2019-04-13 LAB — HEPATIC FUNCTION PANEL
ALT: 52 U/L — ABNORMAL HIGH (ref 0–44)
AST: 43 U/L — ABNORMAL HIGH (ref 15–41)
Albumin: 4.3 g/dL (ref 3.5–5.0)
Alkaline Phosphatase: 92 U/L (ref 38–126)
Bilirubin, Direct: 0.2 mg/dL (ref 0.0–0.2)
Indirect Bilirubin: 0.9 mg/dL (ref 0.3–0.9)
Total Bilirubin: 1.1 mg/dL (ref 0.3–1.2)
Total Protein: 8.1 g/dL (ref 6.5–8.1)

## 2019-04-15 ENCOUNTER — Other Ambulatory Visit: Payer: Self-pay | Admitting: Family

## 2019-04-22 ENCOUNTER — Other Ambulatory Visit: Payer: Self-pay | Admitting: Family Medicine

## 2019-04-22 ENCOUNTER — Ambulatory Visit (INDEPENDENT_AMBULATORY_CARE_PROVIDER_SITE_OTHER): Payer: Medicare Other | Admitting: Family Medicine

## 2019-04-22 ENCOUNTER — Telehealth: Payer: Self-pay | Admitting: Family

## 2019-04-22 ENCOUNTER — Encounter: Payer: Self-pay | Admitting: Family Medicine

## 2019-04-22 DIAGNOSIS — J0140 Acute pansinusitis, unspecified: Secondary | ICD-10-CM

## 2019-04-22 MED ORDER — CETIRIZINE HCL 10 MG PO TABS: 10.0000 mg | ORAL_TABLET | Freq: Every day | ORAL | 11 refills | Status: DC

## 2019-04-22 MED ORDER — FLUTICASONE PROPIONATE 50 MCG/ACT NA SUSP: 2.0000 | Freq: Every day | NASAL | 6 refills | Status: DC

## 2019-04-22 MED ORDER — AMOXICILLIN-POT CLAVULANATE 875-125 MG PO TABS: 1.0000 | ORAL_TABLET | Freq: Two times a day (BID) | ORAL | 0 refills | Status: AC

## 2019-04-22 MED ORDER — PREDNISONE 20 MG PO TABS: ORAL_TABLET | ORAL | 0 refills | Status: DC

## 2019-04-22 NOTE — Progress Notes (Signed)
Virtual Visit via telephone Note Due to COVID-19 pandemic this visit was conducted virtually. This visit type was conducted due to national recommendations for restrictions regarding the COVID-19 Pandemic (e.g. social distancing, sheltering in place) in an effort to limit this patient's exposure and mitigate transmission in our community. All issues noted in this document were discussed and addressed.  A physical exam was not performed with this format.   I connected with Thomas Pratt on 04/22/19 at 1500 by telephone and verified that I am speaking with the correct person using two identifiers. Thomas Pratt is currently located at home and family is currently with them during visit. The provider, Monia Pouch, FNP is located in their office at time of visit.  I discussed the limitations, risks, security and privacy concerns of performing an evaluation and management service by telephone and the availability of in person appointments. I also discussed with the patient that there may be a patient responsible charge related to this service. The patient expressed understanding and agreed to proceed.  Subjective:  Patient ID: Thomas Pratt, male    DOB: 1961/05/13, 58 y.o.   MRN: [id number]  Chief Complaint:  Generalized Body Aches and Sinus Problem   HPI: Thomas Pratt is a 58 y.o. male presenting on 04/22/2019 for Generalized Body Aches and Sinus Problem   Pt reports three weeks of frontal headache, pressure behind eyes and in ears, malaise, myalgias, congestion, and sore throat.   Sinus Problem This is a new problem. The current episode started more than 1 month ago. The problem has been waxing and waning since onset. The maximum temperature recorded prior to his arrival was 100.4 - 100.9 F. The fever has been present for less than 1 day. His pain is at a severity of 5/10. The pain is moderate. Associated symptoms include chills, congestion, coughing, ear pain, headaches,  sinus pressure, sneezing, a sore throat and swollen glands. Pertinent negatives include no diaphoresis, hoarse voice, neck pain or shortness of breath. Past treatments include acetaminophen. The treatment provided no relief.     Relevant past medical, surgical, family, and social history reviewed and updated as indicated.  Allergies and medications reviewed and updated.   Past Medical History:  Diagnosis Date  . Cirrhosis ([place])    likely ETOH use     Past Surgical History:  Procedure Laterality Date  . CHOLECYSTECTOMY N/A 09/01/2017   Procedure: LAPAROSCOPIC CHOLECYSTECTOMY;  Surgeon: Aviva Signs, MD;  Location: [facility];  Service: General;  Laterality: N/A;  . COLONOSCOPY WITH PROPOFOL N/A 03/06/2017   Dr. Gala Romney: tubular adenoma removed, next TCS 5 years if health permits.   . ESOPHAGOGASTRODUODENOSCOPY (EGD) WITH PROPOFOL N/A 03/06/2017   Dr. Gala Romney: grade 1 esophageal varices, erosive esophagitis, portal gastropathy.   Marland Kitchen HERNIA REPAIR Right    RIH  . INGUINAL HERNIA REPAIR Right 07/11/2017   Procedure: HERNIA REPAIR INGUINAL ADULT WITH MESH;  Surgeon: Aviva Signs, MD;  Location: [facility];  Service: General;  Laterality: Right;  patient knows to arrive at 7:30  . IR TRANSCATHETER BX  10/14/2017  . IR US GUIDE VASC ACCESS RIGHT  10/14/2017  . IR VENOGRAM HEPATIC WO HEMODYNAMIC EVALUATION  10/14/2017  . POLYPECTOMY  03/06/2017   Procedure: POLYPECTOMY;  Surgeon: Daneil Dolin, MD;  Location: [facility];  Service: Endoscopy;;  ascending colon;    Social History   Socioeconomic History  . Marital status: Divorced    Spouse name: Not on file  . Number of children: Not on  file  . Years of education: Not on file  . Highest education level: Not on file  Occupational History  . Occupation: unemployed  Social Needs  . Financial resource strain: Not on file  . Food insecurity    Worry: Not on file    Inability: Not on file  . Transportation needs    Medical: Not on file     Non-medical: Not on file  Tobacco Use  . Smoking status: Never Smoker  . Smokeless tobacco: Never Used  Substance and Sexual Activity  . Alcohol use: No    Comment: Quit September 2017, heavy alcohol abuse previously  . Drug use: No  . Sexual activity: Not Currently    Partners: Female  Lifestyle  . Physical activity    Days per week: Not on file    Minutes per session: Not on file  . Stress: Not on file  Relationships  . Social Herbalist on phone: Not on file    Gets together: Not on file    Attends religious service: Not on file    Active member of club or organization: Not on file    Attends meetings of clubs or organizations: Not on file    Relationship status: Not on file  . Intimate partner violence    Fear of current or ex partner: Not on file    Emotionally abused: Not on file    Physically abused: Not on file    Forced sexual activity: Not on file  Other Topics Concern  . 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    Drank alcohol for years, quit 05/07/16. Has been sober since then. Quit b/c of health/cirrhosis.   Divorced for over 15 years.   Dating sporadically.   Has 3 daughters and 1 son live locally.   Originally from [place], moved here b/c of wife.    Eats all foods.    Wear seatbelt.   Does not drive, does not have a license, lost due to DWI.    Brought by neighbor.    Does not go to church. Believes in God. Prays each night, per report.     Outpatient Encounter Medications as of 04/22/2019  Medication Sig  . amoxicillin-clavulanate (AUGMENTIN) 875-125 MG tablet Take 1 tablet by mouth 2 (two) times daily for 10 days.  Marland Kitchen atorvastatin (LIPITOR) 10 MG tablet Take 1 tablet (10 mg total) by mouth daily.  . cetirizine (ZYRTEC) 10 MG tablet Take 1 tablet (10 mg total) by mouth daily.  Marland Kitchen DEXILANT 60 MG capsule TAKE 1 CAPSULE BY MOUTH  DAILY  . fluticasone (FLONASE) 50 MCG/ACT  nasal spray Place 2 sprays into both nostrils daily.  . predniSONE (DELTASONE) 20 MG tablet 2 po at sametime daily for 5 days  . rifaximin (XIFAXAN) 550 MG TABS tablet Take 1 tablet (550 mg total) by mouth 2 (two) times daily. (Patient taking differently: Take 550 mg by mouth daily. )  . spironolactone (ALDACTONE) 25 MG tablet Take 1 tablet po bid  . VITAMIN D PO Take by mouth daily. Unsure of strength   No facility-administered encounter medications on file as of 04/22/2019.     No Known Allergies  Review of Systems  Constitutional: Positive for activity change, chills, fatigue and fever. Negative for appetite change, diaphoresis and unexpected weight change.  HENT: Positive for congestion, ear pain, postnasal drip, rhinorrhea, sinus pressure, sinus  pain, sneezing and sore throat. Negative for dental problem, drooling, ear discharge, facial swelling, hearing loss, hoarse voice, mouth sores, nosebleeds, tinnitus, trouble swallowing and voice change.   Eyes: Negative.  Negative for photophobia and visual disturbance.  Respiratory: Positive for cough. Negative for apnea, choking, chest tightness, shortness of breath, wheezing and stridor.   Cardiovascular: Negative for chest pain, palpitations and leg swelling.  Gastrointestinal: Negative for abdominal distention, abdominal pain, anal bleeding, blood in stool, constipation, diarrhea, nausea, rectal pain and vomiting.  Endocrine: Negative.   Genitourinary: Negative for decreased urine volume, difficulty urinating, dysuria, frequency and urgency.  Musculoskeletal: Positive for myalgias. Negative for arthralgias and neck pain.  Skin: Negative.  Negative for color change, pallor and rash.  Allergic/Immunologic: Negative.   Neurological: Positive for dizziness (intermittent with quick position changes, resolves fast, no focal deficits. ) and headaches. Negative for tremors, seizures, syncope, facial asymmetry, speech difficulty, weakness,  light-headedness and numbness.  Hematological: Negative.   Psychiatric/Behavioral: Negative for confusion, hallucinations, sleep disturbance and suicidal ideas.  All other systems reviewed and are negative.        Observations/Objective: No vital signs or physical exam, this was a telephone or virtual health encounter.  Pt alert and oriented, answers all questions appropriately, and able to speak in full sentences.    Assessment and Plan: [place] was seen today for generalized body aches and sinus problem.  Diagnoses and all orders for this visit:  Acute non-recurrent pansinusitis Reported symptoms consistent with pansinusitis. Due to symptoms lasting longer than 10 days, will initiate Augmentin along with prednisone, Flonase, and Zyrtec. Symptomatic care discussed. Stay hydrated, rest, medications as prescribed. Report any new, worsening, or persistent symptoms.  -     amoxicillin-clavulanate (AUGMENTIN) 875-125 MG tablet; Take 1 tablet by mouth 2 (two) times daily for 10 days. -     predniSONE (DELTASONE) 20 MG tablet; 2 po at sametime daily for 5 days -     fluticasone (FLONASE) 50 MCG/ACT nasal spray; Place 2 sprays into both nostrils daily. -     cetirizine (ZYRTEC) 10 MG tablet; Take 1 tablet (10 mg total) by mouth daily.     Follow Up Instructions: Return in about 2 weeks (around 05/06/2019), or if symptoms worsen or fail to improve.    I discussed the assessment and treatment plan with the patient. The patient was provided an opportunity to ask questions and all were answered. The patient agreed with the plan and demonstrated an understanding of the instructions.   The patient was advised to call back or seek an in-person evaluation if the symptoms worsen or if the condition fails to improve as anticipated.  The above assessment and management plan was discussed with the patient. The patient verbalized understanding of and has agreed to the management plan. Patient is  aware to call the clinic if they develop any new symptoms or if symptoms persist or worsen. Patient is aware when to return to the clinic for a follow-up visit. Patient educated on when it is appropriate to go to the emergency department.    I provided 15 minutes of non-face-to-face time during this encounter. The call started at 1500. The call ended at 1515. The other time was used for coordination of care.    Monia Pouch, FNP-C [facility] [address] [place], [place] [address] [phone] 04/22/19

## 2019-04-22 NOTE — Telephone Encounter (Signed)
Patient complains of body aches and overall not feeling well.  Televisit appt made.

## 2019-04-27 ENCOUNTER — Other Ambulatory Visit: Payer: Self-pay | Admitting: Gastroenterology

## 2019-04-28 NOTE — Patient Instructions (Signed)
Thomas Pratt  04/28/2019     @PREFPERIOPPHARMACY @   Your procedure is scheduled on  05/03/2019 .  Report to [name] at  [place].M.  Call this number if you have problems the morning of surgery:  [phone]   Remember:  Follow the diet instructions given to you by Dr Roseanne Kaufman office.                       Take these medicines the morning of surgery with A SIP OF WATER  Dexilant, xifaxon.    Do not wear jewelry, make-up or nail polish.  Do not wear lotions, powders, or perfumes. Please wear deodorant and brush your teeth.  Do not shave 48 hours prior to surgery.  Men may shave face and neck.  Do not bring valuables to the hospital.  [facility] is not responsible for any belongings or valuables.  Contacts, dentures or bridgework may not be worn into surgery.  Leave your suitcase in the car.  After surgery it may be brought to your room.  For patients admitted to the hospital, discharge time will be determined by your treatment team.  Patients discharged the day of surgery will not be allowed to drive home.   Name and phone number of your driver:   daughter Special instructions:  None  Please read over the following fact sheets that you were given. Anesthesia Post-op Instructions and Care and Recovery After Surgery       Upper Endoscopy, Adult, Care After This sheet gives you information about how to care for yourself after your procedure. Your health care provider may also give you more specific instructions. If you have problems or questions, contact your health care provider. What can I expect after the procedure? After the procedure, it is common to have:  A sore throat.  Mild stomach pain or discomfort.  Bloating.  Nausea. Follow these instructions at home:   Follow instructions from your health care provider about what to eat or drink after your procedure.  Return to your normal activities as told by your health care provider. Ask your  health care provider what activities are safe for you.  Take over-the-counter and prescription medicines only as told by your health care provider.  Do not drive for 24 hours if you were given a sedative during your procedure.  Keep all follow-up visits as told by your health care provider. This is important. Contact a health care provider if you have:  A sore throat that lasts longer than one day.  Trouble swallowing. Get help right away if:  You vomit blood or your vomit looks like coffee grounds.  You have: ? A fever. ? Bloody, black, or tarry stools. ? A severe sore throat or you cannot swallow. ? Difficulty breathing. ? Severe pain in your chest or abdomen. Summary  After the procedure, it is common to have a sore throat, mild stomach discomfort, bloating, and nausea.  Do not drive for 24 hours if you were given a sedative during the procedure.  Follow instructions from your health care provider about what to eat or drink after your procedure.  Return to your normal activities as told by your health care provider. This information is not intended to replace advice given to you by your health care provider. Make sure you discuss any questions you have with your health care provider. Document Released: 01/07/2012 Document Revised: 12/30/2017 Document Reviewed: 12/08/2017  Elsevier Patient Education  [employer].  Esophageal Dilatation Esophageal dilatation, also called esophageal dilation, is a procedure to widen or open (dilate) a blocked or narrowed part of the esophagus. The esophagus is the part of the body that moves food and liquid from the mouth to the stomach. You may need this procedure if:  You have a buildup of scar tissue in your esophagus that makes it difficult, painful, or impossible to swallow. This can be caused by gastroesophageal reflux disease (GERD).  You have cancer of the esophagus.  There is a problem with how food moves through your  esophagus. In some cases, you may need this procedure repeated at a later time to dilate the esophagus gradually. Tell a health care provider about:  Any allergies you have.  All medicines you are taking, including vitamins, herbs, eye drops, creams, and over-the-counter medicines.  Any problems you or family members have had with anesthetic medicines.  Any blood disorders you have.  Any surgeries you have had.  Any medical conditions you have.  Any antibiotic medicines you are required to take before dental procedures.  Whether you are pregnant or may be pregnant. What are the risks? Generally, this is a safe procedure. However, problems may occur, including:  Bleeding due to a tear in the lining of the esophagus.  A hole (perforation) in the esophagus. What happens before the procedure?  Follow instructions from your health care provider about eating or drinking restrictions.  Ask your health care provider about changing or stopping your regular medicines. This is especially important if you are taking diabetes medicines or blood thinners.  Plan to have someone take you home from the hospital or clinic.  Plan to have a responsible adult care for you for at least 24 hours after you leave the hospital or clinic. This is important. What happens during the procedure?  You may be given a medicine to help you relax (sedative).  A numbing medicine may be sprayed into the back of your throat, or you may gargle the medicine.  Your health care provider may perform the dilatation using various surgical instruments, such as: ? Simple dilators. This instrument is carefully placed in the esophagus to stretch it. ? Guided wire bougies. This involves using an endoscope to insert a wire into the esophagus. A dilator is passed over this wire to enlarge the esophagus. Then the wire is removed. ? Balloon dilators. An endoscope with a small balloon at the end is inserted into the esophagus.  The balloon is inflated to stretch the esophagus and open it up. The procedure may vary among health care providers and hospitals. What happens after the procedure?  Your blood pressure, heart rate, breathing rate, and blood oxygen level will be monitored until the medicines you were given have worn off.  Your throat may feel slightly sore and numb. This will improve slowly over time.  You will not be allowed to eat or drink until your throat is no longer numb.  When you are able to drink, urinate, and sit on the edge of the bed without nausea or dizziness, you may be able to return home. Follow these instructions at home:  Take over-the-counter and prescription medicines only as told by your health care provider.  Do not drive for 24 hours if you were given a sedative during your procedure.  You should have a responsible adult with you for 24 hours after the procedure.  Follow instructions from your health care provider  about any eating or drinking restrictions.  Do not use any products that contain nicotine or tobacco, such as cigarettes and e-cigarettes. If you need help quitting, ask your health care provider.  Keep all follow-up visits as told by your health care provider. This is important. Get help right away if you:  Have a fever.  Have chest pain.  Have pain that is not relieved by medication.  Have trouble breathing.  Have trouble swallowing.  Vomit blood. Summary  Esophageal dilatation, also called esophageal dilation, is a procedure to widen or open (dilate) a blocked or narrowed part of the esophagus.  Plan to have someone take you home from the hospital or clinic.  For this procedure, a numbing medicine may be sprayed into the back of your throat, or you may gargle the medicine.  Do not drive for 24 hours if you were given a sedative during your procedure. This information is not intended to replace advice given to you by your health care provider. Make  sure you discuss any questions you have with your health care provider. Document Released: 08/29/2005 Document Revised: 06/20/2017 Document Reviewed: 05/13/2017 Elsevier Patient Education  2020 [employer] After These instructions provide you with information about caring for yourself after your procedure. Your health care provider may also give you more specific instructions. Your treatment has been planned according to current medical practices, but problems sometimes occur. Call your health care provider if you have any problems or questions after your procedure. What can I expect after the procedure? After your procedure, you may:  Feel sleepy for several hours.  Feel clumsy and have poor balance for several hours.  Feel forgetful about what happened after the procedure.  Have poor judgment for several hours.  Feel nauseous or vomit.  Have a sore throat if you had a breathing tube during the procedure. Follow these instructions at home: For at least 24 hours after the procedure:      Have a responsible adult stay with you. It is important to have someone help care for you until you are awake and alert.  Rest as needed.  Do not: ? Participate in activities in which you could fall or become injured. ? Drive. ? Use heavy machinery. ? Drink alcohol. ? Take sleeping pills or medicines that cause drowsiness. ? Make important decisions or sign legal documents. ? Take care of children on your own. Eating and drinking  Follow the diet that is recommended by your health care provider.  If you vomit, drink water, juice, or soup when you can drink without vomiting.  Make sure you have little or no nausea before eating solid foods. General instructions  Take over-the-counter and prescription medicines only as told by your health care provider.  If you have sleep apnea, surgery and certain medicines can increase your risk for breathing  problems. Follow instructions from your health care provider about wearing your sleep device: ? Anytime you are sleeping, including during daytime naps. ? While taking prescription pain medicines, sleeping medicines, or medicines that make you drowsy.  If you smoke, do not smoke without supervision.  Keep all follow-up visits as told by your health care provider. This is important. Contact a health care provider if:  You keep feeling nauseous or you keep vomiting.  You feel light-headed.  You develop a rash.  You have a fever. Get help right away if:  You have trouble breathing. Summary  For several hours after your procedure,  you may feel sleepy and have poor judgment.  Have a responsible adult stay with you for at least 24 hours or until you are awake and alert. This information is not intended to replace advice given to you by your health care provider. Make sure you discuss any questions you have with your health care provider. Document Released: 10/29/2015 Document Revised: 10/06/2017 Document Reviewed: 10/29/2015 Elsevier Patient Education  2020 [employer].

## 2019-04-29 ENCOUNTER — Encounter (HOSPITAL_COMMUNITY): Payer: Self-pay | Admitting: *Deleted

## 2019-04-29 ENCOUNTER — Encounter (HOSPITAL_COMMUNITY)
Admission: RE | Admit: 2019-04-29 | Discharge: 2019-04-29 | Disposition: A | Discharge status: Home / Self Care | Payer: Medicare Other | Source: Ambulatory Visit | Attending: Internal Medicine | Admitting: Internal Medicine

## 2019-04-29 ENCOUNTER — Other Ambulatory Visit (HOSPITAL_COMMUNITY)
Admission: RE | Admit: 2019-04-29 | Discharge: 2019-04-29 | Disposition: A | Discharge status: Home / Self Care | Payer: Medicare Other | Source: Ambulatory Visit | Attending: Internal Medicine | Admitting: Internal Medicine

## 2019-04-29 ENCOUNTER — Other Ambulatory Visit: Payer: Self-pay

## 2019-04-29 DIAGNOSIS — Z20828 Contact with and (suspected) exposure to other viral communicable diseases: Secondary | ICD-10-CM | POA: Insufficient documentation

## 2019-04-29 DIAGNOSIS — Z01812 Encounter for preprocedural laboratory examination: Secondary | ICD-10-CM | POA: Insufficient documentation

## 2019-04-29 LAB — SARS CORONAVIRUS 2 (TAT 6-24 HRS): SARS Coronavirus 2: NEGATIVE

## 2019-05-03 ENCOUNTER — Ambulatory Visit (HOSPITAL_COMMUNITY): Payer: Medicare Other | Admitting: Anesthesiology

## 2019-05-03 ENCOUNTER — Encounter (HOSPITAL_COMMUNITY)
Admission: RE | Disposition: A | Discharge status: Home / Self Care | Payer: Self-pay | Source: Home / Self Care | Attending: Internal Medicine

## 2019-05-03 ENCOUNTER — Ambulatory Visit (HOSPITAL_COMMUNITY)
Admission: RE | Admit: 2019-05-03 | Discharge: 2019-05-03 | Disposition: A | Discharge status: Home / Self Care | Payer: Medicare Other | Attending: Internal Medicine | Admitting: Internal Medicine

## 2019-05-03 ENCOUNTER — Encounter (HOSPITAL_COMMUNITY): Payer: Self-pay

## 2019-05-03 ENCOUNTER — Other Ambulatory Visit: Payer: Self-pay

## 2019-05-03 DIAGNOSIS — K3189 Other diseases of stomach and duodenum: Secondary | ICD-10-CM | POA: Insufficient documentation

## 2019-05-03 DIAGNOSIS — E119 Type 2 diabetes mellitus without complications: Secondary | ICD-10-CM | POA: Insufficient documentation

## 2019-05-03 DIAGNOSIS — Z86010 Personal history of colonic polyps: Secondary | ICD-10-CM | POA: Diagnosis not present

## 2019-05-03 DIAGNOSIS — Z79899 Other long term (current) drug therapy: Secondary | ICD-10-CM | POA: Insufficient documentation

## 2019-05-03 DIAGNOSIS — I8510 Secondary esophageal varices without bleeding: Secondary | ICD-10-CM | POA: Diagnosis not present

## 2019-05-03 DIAGNOSIS — Z825 Family history of asthma and other chronic lower respiratory diseases: Secondary | ICD-10-CM | POA: Diagnosis not present

## 2019-05-03 DIAGNOSIS — Z9049 Acquired absence of other specified parts of digestive tract: Secondary | ICD-10-CM | POA: Diagnosis not present

## 2019-05-03 DIAGNOSIS — K7030 Alcoholic cirrhosis of liver without ascites: Secondary | ICD-10-CM | POA: Diagnosis not present

## 2019-05-03 DIAGNOSIS — R1310 Dysphagia, unspecified: Secondary | ICD-10-CM | POA: Diagnosis not present

## 2019-05-03 DIAGNOSIS — R1319 Other dysphagia: Secondary | ICD-10-CM

## 2019-05-03 DIAGNOSIS — K766 Portal hypertension: Secondary | ICD-10-CM | POA: Diagnosis not present

## 2019-05-03 DIAGNOSIS — R1314 Dysphagia, pharyngoesophageal phase: Secondary | ICD-10-CM | POA: Insufficient documentation

## 2019-05-03 DIAGNOSIS — Z8673 Personal history of transient ischemic attack (TIA), and cerebral infarction without residual deficits: Secondary | ICD-10-CM | POA: Insufficient documentation

## 2019-05-03 HISTORY — PX: MALONEY DILATION: SHX5535

## 2019-05-03 HISTORY — PX: ESOPHAGOGASTRODUODENOSCOPY (EGD) WITH PROPOFOL: SHX5813

## 2019-05-03 SURGERY — ESOPHAGOGASTRODUODENOSCOPY (EGD) WITH PROPOFOL
Anesthesia: General

## 2019-05-03 MED ORDER — PROPOFOL 10 MG/ML IV BOLUS
INTRAVENOUS | Status: DC | PRN
  Administered 2019-05-03: 50 mg via INTRAVENOUS
  Administered 2019-05-03: 100 mg via INTRAVENOUS
  Administered 2019-05-03: 50 mg via INTRAVENOUS

## 2019-05-03 MED ORDER — MIDAZOLAM HCL 2 MG/2ML IJ SOLN: 0.5000 mg | Freq: Once | INTRAMUSCULAR | Status: DC | PRN

## 2019-05-03 MED ORDER — LIDOCAINE HCL (CARDIAC) PF 100 MG/5ML IV SOSY
PREFILLED_SYRINGE | INTRAVENOUS | Status: DC | PRN
  Administered 2019-05-03: 40 mg via INTRAVENOUS

## 2019-05-03 MED ORDER — PROMETHAZINE HCL 25 MG/ML IJ SOLN: 6.2500 mg | INTRAMUSCULAR | Status: DC | PRN

## 2019-05-03 MED ORDER — HYDROCODONE-ACETAMINOPHEN 7.5-325 MG PO TABS: 1.0000 | ORAL_TABLET | Freq: Once | ORAL | Status: DC | PRN

## 2019-05-03 MED ORDER — KETAMINE HCL 50 MG/5ML IJ SOSY
PREFILLED_SYRINGE | INTRAMUSCULAR | Status: AC
  Filled 2019-05-03: qty 5

## 2019-05-03 MED ORDER — HYDROMORPHONE HCL 1 MG/ML IJ SOLN: 0.2500 mg | INTRAMUSCULAR | Status: DC | PRN

## 2019-05-03 MED ORDER — KETAMINE HCL 10 MG/ML IJ SOLN
INTRAMUSCULAR | Status: DC | PRN
  Administered 2019-05-03: 20 mg via INTRAVENOUS

## 2019-05-03 MED ORDER — LACTATED RINGERS IV SOLN
INTRAVENOUS | Status: DC
  Administered 2019-05-03: 09:00:00 via INTRAVENOUS

## 2019-05-03 MED ORDER — CHLORHEXIDINE GLUCONATE CLOTH 2 % EX PADS: 6.0000 | MEDICATED_PAD | Freq: Once | CUTANEOUS | Status: DC

## 2019-05-03 NOTE — Addendum Note (Signed)
Addendum  created 05/03/19 1353 by Lyda Jester, CRNA   Charge Capture section accepted

## 2019-05-03 NOTE — Addendum Note (Signed)
Addendum  created 05/03/19 1156 by Lyda Jester, CRNA   Intraprocedure Event deleted

## 2019-05-03 NOTE — Anesthesia Postprocedure Evaluation (Addendum)
Anesthesia Post Note  Patient: Thomas Pratt  Procedure(s) Performed: ESOPHAGOGASTRODUODENOSCOPY (EGD) WITH PROPOFOL (N/A ) MALONEY DILATION (N/A )  Patient location during evaluation: Endoscopy Anesthesia Type: General Level of consciousness: awake and alert Pain management: pain level controlled Vital Signs Assessment: post-procedure vital signs reviewed and stable Respiratory status: spontaneous breathing, nonlabored ventilation, respiratory function stable and patient connected to nasal cannula oxygen Cardiovascular status: blood pressure returned to baseline and stable Postop Assessment: no apparent nausea or vomiting Anesthetic complications: no     Last Vitals:  Vitals:   05/03/19 0849  BP: 124/81  Pulse: 77  Resp: 16  Temp: 36.7 C  SpO2: 96%    Last Pain:  Vitals:   05/03/19 1021  TempSrc:   PainSc: 0-No pain                 Talitha Givens

## 2019-05-03 NOTE — H&P (Signed)
@LOGO @   Primary Care Physician:  Sharion Balloon, FNP Primary Gastroenterologist:  Dr. Gala Romney  Pre-Procedure History & Physical: HPI:  Thomas Pratt is a 58 y.o. male here for for further evaluation of esophageal dysphagia via EGD.  History of alcoholic liver disease/cirrhosis.  For EGD and possible esophageal dilation per plan..  Past Medical History:  Diagnosis Date  . Cirrhosis ([place])    likely ETOH use     Past Surgical History:  Procedure Laterality Date  . CHOLECYSTECTOMY N/A 09/01/2017   Procedure: LAPAROSCOPIC CHOLECYSTECTOMY;  Surgeon: Aviva Signs, MD;  Location: [facility];  Service: General;  Laterality: N/A;  . COLONOSCOPY WITH PROPOFOL N/A 03/06/2017   Dr. Gala Romney: tubular adenoma removed, next TCS 5 years if health permits.   . ESOPHAGOGASTRODUODENOSCOPY (EGD) WITH PROPOFOL N/A 03/06/2017   Dr. Gala Romney: grade 1 esophageal varices, erosive esophagitis, portal gastropathy.   Marland Kitchen HERNIA REPAIR Right    RIH  . INGUINAL HERNIA REPAIR Right 07/11/2017   Procedure: HERNIA REPAIR INGUINAL ADULT WITH MESH;  Surgeon: Aviva Signs, MD;  Location: [facility];  Service: General;  Laterality: Right;  patient knows to arrive at 7:30  . IR TRANSCATHETER BX  10/14/2017  . IR US GUIDE VASC ACCESS RIGHT  10/14/2017  . IR VENOGRAM HEPATIC WO HEMODYNAMIC EVALUATION  10/14/2017  . POLYPECTOMY  03/06/2017   Procedure: POLYPECTOMY;  Surgeon: Daneil Dolin, MD;  Location: [facility] SUITE;  Service: Endoscopy;;  ascending colon;    Prior to Admission medications   Medication Sig Start Date End Date Taking? Authorizing Provider  atorvastatin (LIPITOR) 10 MG tablet Take 1 tablet (10 mg total) by mouth daily. 11/09/18 11/09/19 Yes Nita Sells, MD  cetirizine (ZYRTEC) 10 MG tablet Take 1 tablet (10 mg total) by mouth daily. 04/22/19  Yes Baruch Gouty, FNP  DEXILANT 60 MG capsule TAKE 1 CAPSULE BY MOUTH  DAILY 04/16/19  Yes Hawks, Christy A, FNP  fluticasone (FLONASE) 50 MCG/ACT nasal spray Place 2  sprays into both nostrils daily. 04/22/19  Yes Rakes, Connye Burkitt, FNP  predniSONE (DELTASONE) 20 MG tablet 2 po at sametime daily for 5 days 04/22/19  Yes Rakes, Connye Burkitt, FNP  rifaximin (XIFAXAN) 550 MG TABS tablet Take 1 tablet (550 mg total) by mouth 2 (two) times daily. Patient taking differently: Take 550 mg by mouth daily.  11/09/18  Yes Nita Sells, MD  spironolactone (ALDACTONE) 50 MG tablet Take 50 mg by mouth daily.  11/09/18  Yes [provider]  VITAMIN D PO Take by mouth daily. Unsure of strength   Yes [provider]    Allergies as of 03/02/2019  . (No Known Allergies)    Family History  Problem Relation Age of Onset  . COPD Mother   . Liver disease Neg Hx   . Colon cancer Neg Hx   . Colon polyps Neg Hx     Social History   Socioeconomic History  . Marital status: Divorced    Spouse name: Not on file  . Number of children: Not on file  . Years of education: Not on file  . Highest education level: Not on file  Occupational History  . Occupation: unemployed  Social Needs  . Financial resource strain: Not on file  . Food insecurity    Worry: Not on file    Inability: Not on file  . Transportation needs    Medical: Not on file    Non-medical: Not on file  Tobacco Use  . Smoking  status: Never Smoker  . Smokeless tobacco: Never Used  Substance and Sexual Activity  . Alcohol use: No    Comment: Quit September 2017, heavy alcohol abuse previously  . Drug use: No  . Sexual activity: Not Currently    Partners: Female  Lifestyle  . Physical activity    Days per week: Not on file    Minutes per session: Not on file  . Stress: Not on file  Relationships  . Social Herbalist on phone: Not on file    Gets together: Not on file    Attends religious service: Not on file    Active member of club or organization: Not on file    Attends meetings of clubs or organizations: Not on file    Relationship status: Not on file  . Intimate  partner violence    Fear of current or ex partner: Not on file    Emotionally abused: Not on file    Physically abused: Not on file    Forced sexual activity: Not on file  Other Topics Concern  . Not on file  Social History Narrative   Lives in [place], [place]. 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    Drank alcohol for years, quit 05/07/16. Has been sober since then. Quit b/c of health/cirrhosis.   Divorced for over 15 years.   Dating sporadically.   Has 3 daughters and 1 son live locally.   Originally from [place], moved here b/c of wife.    Eats all foods.    Wear seatbelt.   Does not drive, does not have a license, lost due to DWI.    Brought by neighbor.    Does not go to church. Believes in God. Prays each night, per report.     Review of Systems: See HPI, otherwise negative ROS  Physical Exam: BP 124/81   Pulse 77   Temp 98.1 F (36.7 C) (Oral)   Resp 16   Ht 5\' 8"  (1.727 m)   Wt 77.1 kg   SpO2 96%   BMI 25.85 kg/m  General:   Alert,  Well-developed, well-nourished, pleasant and cooperative in NAD SNeck:  Supple; no masses or thyromegaly. No significant cervical adenopathy. Lungs:  Clear throughout to auscultation.   No wheezes, crackles, or rhonchi. No acute distress. Heart:  Regular rate and rhythm; no murmurs, clicks, rubs,  or gallops. Abdomen: Non-distended, normal bowel sounds.  Soft and nontender without appreciable mass or hepatosplenomegaly.  Pulses:  Normal pulses noted. Extremities:  Without clubbing or edema.  Impression/Plan:  58 year old gentleman with  recurrent esophageal dysphagia.  Here for EGD with possible esophageal dilation per plan.  The risks, benefits, limitations, alternatives and imponderables have been reviewed with the patient. Potential for esophageal dilation, biopsy, etc. have also been reviewed.  Questions have been answered. All parties agreeable.     Notice: This dictation was prepared with Dragon dictation along with  smaller phrase technology. Any transcriptional errors that result from this process are unintentional and may not be corrected upon review.

## 2019-05-03 NOTE — Transfer of Care (Addendum)
Immediate Anesthesia Transfer of Care Note  Patient: Thomas Pratt  Procedure(s) Performed: ESOPHAGOGASTRODUODENOSCOPY (EGD) WITH PROPOFOL (N/A ) MALONEY DILATION (N/A )  Patient Location: PACU  Anesthesia Type:General  Level of Consciousness: awake, alert  and oriented  Airway & Oxygen Therapy: Patient Spontanous Breathing and Patient connected to nasal cannula oxygen  Post-op Assessment: Report given to RN, Post -op Vital signs reviewed and stable and Patient moving all extremities X 4  Post vital signs: Reviewed and stable  Last Vitals:  Vitals Value Taken Time  BP 105/75 05/03/19 1038  Temp    Pulse 73 05/03/19 1039  Resp 18 05/03/19 1039  SpO2 94 % 05/03/19 1039  Vitals shown include unvalidated device data.  Last Pain:  Vitals:   05/03/19 1021  TempSrc:   PainSc: 0-No pain      Patients Stated Pain Goal: 7 ([id number])  Complications: No apparent anesthesia complications

## 2019-05-03 NOTE — Discharge Instructions (Signed)
EGD Discharge instructions Please read the instructions outlined below and refer to this sheet in the next few weeks. These discharge instructions provide you with general information on caring for yourself after you leave the hospital. Your doctor may also give you specific instructions. While your treatment has been planned according to the most current medical practices available, unavoidable complications occasionally occur. If you have any problems or questions after discharge, please call your doctor. ACTIVITY  You may resume your regular activity but move at a slower pace for the next 24 hours.   Take frequent rest periods for the next 24 hours.   Walking will help expel (get rid of) the air and reduce the bloated feeling in your abdomen.   No driving for 24 hours (because of the anesthesia (medicine) used during the test).   You may shower.   Do not sign any important legal documents or operate any machinery for 24 hours (because of the anesthesia used during the test).  NUTRITION  Drink plenty of fluids.   You may resume your normal diet.   Begin with a light meal and progress to your normal diet.   Avoid alcoholic beverages for 24 hours or as instructed by your caregiver.  MEDICATIONS  You may resume your normal medications unless your caregiver tells you otherwise.  WHAT YOU CAN EXPECT TODAY  You may experience abdominal discomfort such as a feeling of fullness or gas pains.  FOLLOW-UP  Your doctor will discuss the results of your test with you.  SEEK IMMEDIATE MEDICAL ATTENTION IF ANY OF THE FOLLOWING OCCUR:  Excessive nausea (feeling sick to your stomach) and/or vomiting.   Severe abdominal pain and distention (swelling).   Trouble swallowing.   Temperature over 101 F (37.8 C).   Rectal bleeding or vomiting of blood.    Continue Dexilant 60 mg daily  Office visit with [place] in 6 months if not already scheduled.  I discussed my findings and  recommendations via telephone with Danae Chen, daughter at [phone]

## 2019-05-03 NOTE — Anesthesia Preprocedure Evaluation (Signed)
Anesthesia Evaluation  Patient identified by MRN, date of birth, ID band Patient awake    Reviewed: Allergy & Precautions, NPO status , Patient's Chart, lab work & pertinent test results  Airway Mallampati: II  TM Distance: >3 FB Neck ROM: Full    Dental no notable dental hx. (+) Poor Dentition   Pulmonary neg pulmonary ROS,    Pulmonary exam normal breath sounds clear to auscultation       Cardiovascular Exercise Tolerance: Good hypertension, Normal cardiovascular examI Rhythm:Regular Rate:Normal     Neuro/Psych CVA, No Residual Symptoms negative psych ROS   GI/Hepatic negative GI ROS, (+) Cirrhosis       ,   Endo/Other  diabetes  Renal/GU negative Renal ROS  negative genitourinary   Musculoskeletal negative musculoskeletal ROS (+)   Abdominal   Peds negative pediatric ROS (+)  Hematology negative hematology ROS (+)   Anesthesia Other Findings   Reproductive/Obstetrics negative OB ROS                             Anesthesia Physical Anesthesia Plan  ASA: III  Anesthesia Plan: General   Post-op Pain Management:    Induction: Intravenous  PONV Risk Score and Plan: 2 and TIVA, Propofol infusion, Treatment may vary due to age or medical condition and Ondansetron  Airway Management Planned: Simple Face Mask and Nasal Cannula  Additional Equipment:   Intra-op Plan:   Post-operative Plan:   Informed Consent: I have reviewed the patients History and Physical, chart, labs and discussed the procedure including the risks, benefits and alternatives for the proposed anesthesia with the patient or authorized representative who has indicated his/her understanding and acceptance.     Dental advisory given  Plan Discussed with: CRNA  Anesthesia Plan Comments: (Plan Full PPE use  Plan GA with GETA as needed d/w pt -WTP with same after Q&A)        Anesthesia Quick  Evaluation

## 2019-05-03 NOTE — Op Note (Signed)
[facility] Patient Name: Thomas Pratt Procedure Date: 05/03/2019 10:05 AM MRN: [id number] Date of Birth: January 21, 1961 Attending MD: Norvel Richards , MD CSN: [id number] Age: 58 Admit Type: Outpatient Procedure:                Upper GI endoscopy Indications:              Dysphagia Providers:                Norvel Richards, MD, Jeanann Lewandowsky. Sharon Seller, RN,                            Aram Candela Referring MD:              Medicines:                Propofol per Anesthesia Complications:            No immediate complications. Estimated Blood Loss:     Estimated blood loss: none. Procedure:                Pre-Anesthesia Assessment:                           - Prior to the procedure, a History and Physical                            was performed, and patient medications and                            allergies were reviewed. The patient's tolerance of                            previous anesthesia was also reviewed. The risks                            and benefits of the procedure and the sedation                            options and risks were discussed with the patient.                            All questions were answered, and informed consent                            was obtained. Prior Anticoagulants: The patient has                            taken no previous anticoagulant or antiplatelet                            agents. ASA Grade Assessment: III - A patient with                            severe systemic disease. After reviewing the risks  and benefits, the patient was deemed in                            satisfactory condition to undergo the procedure.                           After obtaining informed consent, the endoscope was                            passed under direct vision. Throughout the                            procedure, the patient's blood pressure, pulse, and                            oxygen saturations were  monitored continuously. The                            [id number]-[id number] ID:[id number]) scope was introduced through the                            mouth, and advanced to the second part of duodenum.                            The upper GI endoscopy was accomplished without                            difficulty. The patient tolerated the procedure                            well. Scope In: 10:26:24 AM Scope Out: 10:30:42 AM Total Procedure Duration: 0 hours 4 minutes 18 seconds  Findings:      2 columns of grade 2 esophageal varices approximally 6 cm in length;       otherwise normal-appearing patent tubular esophagus.      Moderate portal hypertensive gastropathy was found in the stomach. Small       hiatal hernia; otherwise normal-appearing stomach      The duodenal bulb and second portion of the duodenum were normal. The       scope was withdrawn. Dilation was performed with a Maloney dilator with       mild resistance at 56 Fr. The dilation site was examined following       endoscope reinsertion and showed no change. Estimated blood loss was       minimal Impression:               -Grade 2 esophageal varices?"status post passage of                            a Maloney dilator                           - Portal hypertensive gastropathy.                           - Normal duodenal bulb and second  portion of the                            duodenum.                           - No specimens collected. Moderate Sedation:      Moderate (conscious) sedation was personally administered by an       anesthesia professional. The following parameters were monitored: oxygen       saturation, heart rate, blood pressure, respiratory rate, EKG, adequacy       of pulmonary ventilation, and response to care. Recommendation:           - Patient has a contact number available for                            emergencies. The signs and symptoms of potential                            delayed complications were  discussed with the                            patient. Return to normal activities tomorrow.                            Written discharge instructions were provided to the                            patient.                           - Resume previous diet. Continue Dexilant 60 mg                            daily                           - Continue present medications.                           - Return to my office (date not yet determined). Procedure Code(s):        --- Professional ---                           [phone], Esophagogastroduodenoscopy, flexible,                            transoral; diagnostic, including collection of                            specimen(s) by brushing or washing, when performed                            (separate procedure)                           [id number], Dilation of esophagus, by unguided sound or  bougie, single or multiple passes Diagnosis Code(s):        --- Professional ---                           K76.6, Portal hypertension                           K31.89, Other diseases of stomach and duodenum                           R13.10, Dysphagia, unspecified CPT copyright 2019 American Medical Association. All rights reserved. The codes documented in this report are preliminary and upon coder review may  be revised to meet current compliance requirements. Cristopher Estimable. Hani Patnode, MD Norvel Richards, MD 05/03/2019 10:40:15 AM This report has been signed electronically. Number of Addenda: 0

## 2019-05-03 NOTE — Addendum Note (Signed)
Addendum  created 05/03/19 1602 by Ollen Bowl, CRNA   Clinical Note Signed

## 2019-05-03 NOTE — Addendum Note (Signed)
Addendum  created 05/03/19 1558 by Ollen Bowl, CRNA   Clinical Note Signed

## 2019-05-10 ENCOUNTER — Encounter (HOSPITAL_COMMUNITY): Payer: Self-pay | Admitting: Internal Medicine

## 2019-05-19 ENCOUNTER — Other Ambulatory Visit: Payer: Self-pay

## 2019-05-20 ENCOUNTER — Encounter: Payer: Self-pay | Admitting: Family

## 2019-05-20 ENCOUNTER — Ambulatory Visit (INDEPENDENT_AMBULATORY_CARE_PROVIDER_SITE_OTHER): Payer: Medicare Other | Admitting: Family

## 2019-05-20 VITALS — BP 115/81 | HR 90 | Temp 98.0°F | Ht 68.0 in | Wt 157.0 lb

## 2019-05-20 DIAGNOSIS — K7030 Alcoholic cirrhosis of liver without ascites: Secondary | ICD-10-CM | POA: Diagnosis not present

## 2019-05-20 DIAGNOSIS — I10 Essential (primary) hypertension: Secondary | ICD-10-CM | POA: Diagnosis not present

## 2019-05-20 DIAGNOSIS — F1011 Alcohol abuse, in remission: Secondary | ICD-10-CM

## 2019-05-20 DIAGNOSIS — F320 Major depressive disorder, single episode, mild: Secondary | ICD-10-CM

## 2019-05-20 DIAGNOSIS — E049 Nontoxic goiter, unspecified: Secondary | ICD-10-CM

## 2019-05-20 DIAGNOSIS — Z0001 Encounter for general adult medical examination with abnormal findings: Secondary | ICD-10-CM | POA: Diagnosis not present

## 2019-05-20 DIAGNOSIS — K7460 Unspecified cirrhosis of liver: Secondary | ICD-10-CM | POA: Diagnosis not present

## 2019-05-20 DIAGNOSIS — E1169 Type 2 diabetes mellitus with other specified complication: Secondary | ICD-10-CM

## 2019-05-20 DIAGNOSIS — Z23 Encounter for immunization: Secondary | ICD-10-CM | POA: Diagnosis not present

## 2019-05-20 DIAGNOSIS — R5383 Other fatigue: Secondary | ICD-10-CM

## 2019-05-20 DIAGNOSIS — Z0000 Encounter for general adult medical examination without abnormal findings: Secondary | ICD-10-CM

## 2019-05-20 DIAGNOSIS — R634 Abnormal weight loss: Secondary | ICD-10-CM

## 2019-05-20 LAB — BAYER DCA HB A1C WAIVED: HB A1C (BAYER DCA - WAIVED): >14 % — ABNORMAL HIGH (ref <7.0)

## 2019-05-20 MED ORDER — SPIRONOLACTONE 50 MG PO TABS: 50.0000 mg | ORAL_TABLET | Freq: Every day | ORAL | 4 refills | Status: DC

## 2019-05-20 MED ORDER — ESCITALOPRAM OXALATE 10 MG PO TABS: 10.0000 mg | ORAL_TABLET | Freq: Every day | ORAL | 3 refills | Status: DC

## 2019-05-20 NOTE — Patient Instructions (Signed)
Goiter  A goiter is an enlarged thyroid gland. The thyroid is located in the lower front of the neck. It makes hormones that affect many body parts and systems, including the system that affects how quickly the body burns fuel for energy (metabolism). Most goiters are painless and are not a cause for concern. Some goiters can affect the way your thyroid makes thyroid hormones. Goiters and conditions that cause goiters can be treated, if necessary. What are the causes? Common causes of this condition include:  Lack (deficiency) of a mineral called iodine. The thyroid gland uses iodine to make thyroid hormones.  Diseases that attack healthy cells in the body (autoimmune diseases) and affect thyroid function, such as Graves' disease or Hashimoto's disease. These diseases may cause the body to produce too much thyroid hormone (hyperthyroidism) or too little of the hormone (hypothyroidism).  Conditions that cause inflammation of the thyroid (thyroiditis).  One or more small growths on the thyroid (nodular goiter). Other causes include:  Medical problems caused by abnormal genes that are passed from parent to child (genetic defects).  Thyroid injury or infection.  Tumors that may or may not be cancerous.  Pregnancy.  Certain medicines.  Exposure to radiation. In some cases, the cause may not be known. What increases the risk? This condition is more likely to develop in:  People who do not get enough iodine in their diet.  People who have a family history of goiter.  Women.  People who are older than age 40.  People who smoke tobacco.  People who have had exposure to radiation. What are the signs or symptoms? The main symptom of this condition is swelling in the lower, front part of the neck. This swelling can range from a very small bump to a large lump. Other symptoms may include:  A tight feeling in the throat.  A hoarse voice.  Coughing.  Wheezing.  Difficulty  swallowing or breathing.  Bulging veins in the neck.  Dizziness. When a goiter is the result of an overactive thyroid (hyperthyroidism), symptoms may also include:  Nervousness or restlessness.  Inability to tolerate heat.  Unexplained weight loss.  Diarrhea.  Change in the texture of hair or skin.  Changes in heartbeat, such as skipped beats, extra beats, or a rapid heart rate.  Loss of menstruation.  Shaky hands.  Increased appetite.  Sleep problems. When a goiter is the result of an underactive thyroid (hypothyroidism), symptoms may also include:  Feeling like you have no energy (lethargy).  Inability to tolerate cold.  Weight gain that is not explained by a change in diet or exercise habits.  Dry skin.  Coarse hair.  Irregular menstrual periods.  Constipation.  Sadness or depression.  Fatigue. In some cases, there may not be any symptoms and the thyroid hormone levels may be normal. How is this diagnosed? This condition may be diagnosed based on your symptoms, your medical history, and a physical exam. You may have tests, such as:  Blood tests to check thyroid function.  Imaging tests, such as: ? Ultrasound. ? CT scan. ? MRI. ? Thyroid scan.  Removal of a tissue sample (biopsy) of the goiter or any nodules. The sample will be tested to check for cancer. How is this treated? Treatment for this condition depends on the cause and your symptoms. Treatment may include:  Medicines to regulate thyroid hormone levels.  Anti-inflammatory medicines or steroid medicines, if the goiter is caused by inflammation.  Iodine supplements or changes to your   diet, if the goiter is caused by iodine deficiency.  Radioactive iodine treatment.  Surgery to remove your thyroid. In some cases, you may only need regular check-ups with your health care provider to monitor your condition, and you may not need treatment. Follow these instructions at home:  Follow  instructions from your health care provider about any changes to your diet.  Take over-the-counter and prescription medicines only as told by your health care provider. These include supplements.  Do not use any products that contain nicotine or tobacco, such as cigarettes and e-cigarettes. If you need help quitting, ask your health care provider.  Keep all follow-up visits as told by your health care provider. This is important. Contact a health care provider if:  Your symptoms do not get better with treatment.  You have nausea, vomiting, or diarrhea. Get help right away if:  You have sudden, unexplained confusion or other mental changes.  You have a fever.  You have chest pain.  You have trouble breathing or swallowing.  You suddenly become very weak.  You experience extreme restlessness.  You feel your heart racing. Summary  A goiter is an enlarged thyroid gland.  The thyroid gland is located in the lower front of the neck. It makes hormones that affect many body parts and systems, including the system that affects how quickly the body burns fuel for energy (metabolism).  The main symptom of this condition is swelling in the lower, front part of the neck. This swelling can range from a very small bump to a large lump.  Treatment for this condition depends on the cause and your symptoms. You may need medicines, supplements, or regular monitoring of your condition. This information is not intended to replace advice given to you by your health care provider. Make sure you discuss any questions you have with your health care provider. Document Released: 12/26/2009 Document Revised: 06/20/2017 Document Reviewed: 04/03/2017 Elsevier Patient Education  2020 Elsevier Inc.  

## 2019-05-20 NOTE — Progress Notes (Signed)
Subjective:    Patient ID: Thomas Pratt, male    DOB: [birth year], 58 y.o.   MRN: [id number]  Chief Complaint  Patient presents with  . Annual Exam    feels weak and losing weight, itching , review endoscopy results,right lower back pain  . Depression    may need medicine   Pt presents to the office today for CPE. He states he has lost 21 pounds since 08/20/18. He states "I eat pretty good".  He had an EEG done on 05/03/19 and is followed by GI for cirrhosis. He quit drinking years ago.    Depression        This is a new problem.  The current episode started 1 to 4 weeks ago.   The onset quality is gradual.   The problem occurs every several days.  The problem has been gradually worsening since onset.  Associated symptoms include fatigue, irritable, restlessness, decreased interest and sad.  Associated symptoms include no helplessness, no hopelessness and no headaches.     The symptoms are aggravated by family issues (health problems).  Past treatments include nothing. Hypertension This is a chronic problem. The current episode started more than 1 year ago. The problem has been resolved since onset. The problem is controlled. Associated symptoms include malaise/fatigue. Pertinent negatives include no blurred vision, headaches, peripheral edema or shortness of breath. Risk factors for coronary artery disease include male gender and sedentary lifestyle. The current treatment provides moderate improvement. There is no history of kidney disease, CAD/MI, CVA or heart failure.  Gastroesophageal Reflux He reports no belching, no coughing or no heartburn. This is a chronic problem. The current episode started more than 1 year ago. The problem occurs occasionally. The problem has been waxing and waning. The symptoms are aggravated by certain foods. Associated symptoms include fatigue. He has tried a PPI for the symptoms. The treatment provided moderate relief.  Diabetes He presents for his follow-up  diabetic visit. He has type 2 diabetes mellitus. His disease course has been stable. There are no hypoglycemic associated symptoms. Pertinent negatives for hypoglycemia include no headaches. Associated symptoms include fatigue and foot paresthesias. Pertinent negatives for diabetes include no blurred vision, no foot ulcerations and no visual change. Symptoms are stable. Pertinent negatives for diabetic complications include no CVA. Risk factors for coronary artery disease include dyslipidemia, diabetes mellitus, hypertension and sedentary lifestyle. (Does not check BS at home ) Eye exam is not current.      Review of Systems  Constitutional: Positive for fatigue and malaise/fatigue.  Eyes: Negative for blurred vision.  Respiratory: Negative for cough and shortness of breath.   Gastrointestinal: Negative for heartburn.  Neurological: Negative for headaches.  Psychiatric/Behavioral: Positive for depression.  All other systems reviewed and are negative.  Family History  Problem Relation Age of Onset  . COPD Mother   . Liver disease Neg Hx   . Colon cancer Neg Hx   . Colon polyps Neg Hx     Social History   Socioeconomic History  . Marital status: Divorced    Spouse name: Not on file  . Number of children: Not on file  . Years of education: Not on file  . Highest education level: Not on file  Occupational History  . Occupation: unemployed    Comment: disability  Social Needs  . Financial resource strain: Not on file  . Food insecurity    Worry: Not on file    Inability: Not on file  . Transportation needs  Medical: Not on file    Non-medical: Not on file  Tobacco Use  . Smoking status: Never Smoker  . Smokeless tobacco: Never Used  Substance and Sexual Activity  . Alcohol use: No    Comment: Quit September 2017, heavy alcohol abuse previously  . Drug use: No  . Sexual activity: Not Currently    Partners: Female  Lifestyle  . Physical activity    Days per week: Not  on file    Minutes per session: Not on file  . Stress: Not on file  Relationships  . Social Herbalist on phone: Not on file    Gets together: Not on file    Attends religious service: Not on file    Active member of club or organization: Not on file    Attends meetings of clubs or organizations: Not on file    Relationship status: Not on file  Other Topics Concern  . Not on file  Social History Narrative   Lives in [place], [place]. 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    Drank alcohol for years, quit 05/07/16. Has been sober since then. Quit b/c of health/cirrhosis.   Divorced for over 15 years.   Dating sporadically.   Has 3 daughters and 1 son live locally.   Originally from [place], moved here b/c of wife.    Eats all foods.    Wear seatbelt.   Does not drive, does not have a license, lost due to DWI.    Brought by neighbor.    Does not go to church. Believes in God. Prays each night, per report.        Objective:   Physical Exam Vitals signs reviewed.  Constitutional:      General: He is irritable. He is not in acute distress.    Appearance: He is well-developed.  HENT:     Head: Normocephalic.     Right Ear: Tympanic membrane normal.     Left Ear: Tympanic membrane normal.  Eyes:     General:        Right eye: No discharge.        Left eye: No discharge.     Pupils: Pupils are equal, round, and reactive to light.  Neck:     Musculoskeletal: Normal range of motion and neck supple.     Thyroid: No thyromegaly.  Cardiovascular:     Rate and Rhythm: Normal rate and regular rhythm.     Heart sounds: Normal heart sounds. No murmur.  Pulmonary:     Effort: Pulmonary effort is normal. No respiratory distress.     Breath sounds: Normal breath sounds. No wheezing.  Abdominal:     General: Bowel sounds are normal. There is no distension.     Palpations: Abdomen is soft.     Tenderness: There is no abdominal tenderness.  Musculoskeletal: Normal  range of motion.        General: No tenderness.  Skin:    General: Skin is warm and dry.     Findings: No erythema or rash.  Neurological:     Mental Status: He is alert and oriented to person, place, and time.     Cranial Nerves: No cranial nerve deficit.     Deep Tendon Reflexes: Reflexes are normal and symmetric.  Psychiatric:        Behavior: Behavior normal.        Thought Content: Thought content normal.  Judgment: Judgment normal.    Diabetic Foot Exam - Simple   Simple Foot Form Diabetic Foot exam was performed with the following findings: Yes 05/20/2019  2:40 PM  Visual Inspection No deformities, no ulcerations, no other skin breakdown bilaterally: Yes Sensation Testing Intact to touch and monofilament testing bilaterally: Yes Pulse Check Posterior Tibialis and Dorsalis pulse intact bilaterally: Yes Comments     BP 115/81   Pulse 90   Temp 98 F (36.7 C) (Temporal)   Ht '5\' 8"'  (1.727 m)   Wt 157 lb (71.2 kg)   SpO2 97%   BMI 23.87 kg/m      Assessment & Plan:  Thomas Pratt comes in today with chief complaint of Annual Exam (feels weak and losing weight, itching , review endoscopy results,right lower back pain) and Depression (may need medicine)   Diagnosis and orders addressed:  1. Annual physical exam - Anemia Profile B - CMP14+EGFR - Lipid panel - TSH - PSA, total and free - Bayer DCA Hb A1c Waived - Microalbumin / creatinine urine ratio  2. Essential hypertension - CMP14+EGFR - spironolactone (ALDACTONE) 50 MG tablet; Take 1 tablet (50 mg total) by mouth daily.  Dispense: 90 tablet; Refill: 4  3. Alcoholic cirrhosis of liver without ascites (HCC) - CMP14+EGFR  4. Cirrhosis of liver without ascites, unspecified hepatic cirrhosis type (HCC) - CMP14+EGFR  5. Type 2 diabetes mellitus with other specified complication, without long-term current use of insulin (HCC) - CMP14+EGFR - Bayer DCA Hb A1c Waived - Microalbumin / creatinine  urine ratio  6. History of ETOH abuse - CMP14+EGFR  7. Depression, major, single episode, mild (HCC) - CMP14+EGFR - escitalopram (LEXAPRO) 10 MG tablet; Take 1 tablet (10 mg total) by mouth daily.  Dispense: 90 tablet; Refill: 3  8. Fatigue, unspecified type - Anemia Profile B - CMP14+EGFR - TSH - US THYROID; Future  9. Weight loss, unintentional  - Anemia Profile B - CMP14+EGFR - US THYROID; Future  10. Goiter - US THYROID; Future   Labs pending Health Maintenance reviewed Diet and exercise encouraged  Follow up plan: 4 weeks to recheck depression and weight loss   Evelina Dun, FNP

## 2019-05-21 ENCOUNTER — Other Ambulatory Visit: Payer: Self-pay | Admitting: Family

## 2019-05-21 DIAGNOSIS — E1169 Type 2 diabetes mellitus with other specified complication: Secondary | ICD-10-CM

## 2019-05-21 LAB — CBC WITH DIFFERENTIAL/PLATELET
Basophils Absolute: 0 10*3/uL (ref 0.0–0.2)
Basos: 0 %
EOS (ABSOLUTE): 0.1 10*3/uL (ref 0.0–0.4)
Eos: 1 %
Hematocrit: 45.7 % (ref 37.5–51.0)
Hemoglobin: 15.8 g/dL (ref 13.0–17.7)
Immature Grans (Abs): 0 10*3/uL (ref 0.0–0.1)
Immature Granulocytes: 0 %
Lymphocytes Absolute: 1.8 10*3/uL (ref 0.7–3.1)
Lymphs: 25 %
MCH: 32.7 pg (ref 26.6–33.0)
MCHC: 34.6 g/dL (ref 31.5–35.7)
MCV: 95 fL (ref 79–97)
Monocytes Absolute: 0.4 10*3/uL (ref 0.1–0.9)
Monocytes: 6 %
Neutrophils Absolute: 5 10*3/uL (ref 1.4–7.0)
Neutrophils: 68 %
Platelets: 197 10*3/uL (ref 150–450)
RBC: 4.83 x10E6/uL (ref 4.14–5.80)
RDW: 11.9 % (ref 11.6–15.4)
WBC: 7.3 10*3/uL (ref 3.4–10.8)

## 2019-05-21 LAB — MICROALBUMIN / CREATININE URINE RATIO
Creatinine, Urine: 27.6 mg/dL
Microalb/Creat Ratio: <11 mg/g creat (ref 0–29)
Microalbumin, Urine: <3 ug/mL

## 2019-05-21 MED ORDER — TRESIBA FLEXTOUCH 100 UNIT/ML ~~LOC~~ SOPN: 10.0000 [IU] | PEN_INJECTOR | Freq: Every day | SUBCUTANEOUS | 1 refills | Status: DC

## 2019-05-21 NOTE — Addendum Note (Signed)
Addended by: Shelbie Ammons on: 05/21/2019 10:27 AM   Modules accepted: Orders

## 2019-05-21 NOTE — Addendum Note (Signed)
Addended by: Liliane Bade on: 05/21/2019 11:07 AM   Modules accepted: Orders

## 2019-05-24 ENCOUNTER — Telehealth: Payer: Self-pay | Admitting: Family

## 2019-05-24 NOTE — Telephone Encounter (Signed)
Patient aware to take insulin as directed and keep log of BS.

## 2019-05-25 ENCOUNTER — Telehealth: Payer: Self-pay | Admitting: Family

## 2019-05-25 LAB — CMP14+EGFR
ALT: 72 IU/L — ABNORMAL HIGH (ref 0–44)
AST: 50 IU/L — ABNORMAL HIGH (ref 0–40)
Albumin/Globulin Ratio: 1.4 (ref 1.2–2.2)
Albumin: 4.7 g/dL (ref 3.8–4.9)
Alkaline Phosphatase: 131 IU/L — ABNORMAL HIGH (ref 39–117)
BUN/Creatinine Ratio: 16 (ref 9–20)
BUN: 17 mg/dL (ref 6–24)
Bilirubin Total: 0.6 mg/dL (ref 0.0–1.2)
CO2: 21 mmol/L (ref 20–29)
Calcium: 10.7 mg/dL — ABNORMAL HIGH (ref 8.7–10.2)
Chloride: 93 mmol/L — ABNORMAL LOW (ref 96–106)
Creatinine, Ser: 1.06 mg/dL (ref 0.76–1.27)
GFR calc Af Amer: 88 mL/min/{1.73_m2} (ref >59)
GFR calc non Af Amer: 76 mL/min/{1.73_m2} (ref >59)
Globulin, Total: 3.4 g/dL (ref 1.5–4.5)
Glucose: 460 mg/dL — ABNORMAL HIGH (ref 65–99)
Potassium: 5 mmol/L (ref 3.5–5.2)
Sodium: 134 mmol/L (ref 134–144)
Total Protein: 8.1 g/dL (ref 6.0–8.5)

## 2019-05-25 LAB — LIPID PANEL
Chol/HDL Ratio: 4 ratio (ref 0.0–5.0)
Cholesterol, Total: 181 mg/dL (ref 100–199)
HDL: 45 mg/dL (ref >39)
LDL Chol Calc (NIH): 103 mg/dL — ABNORMAL HIGH (ref 0–99)
Triglycerides: 193 mg/dL — ABNORMAL HIGH (ref 0–149)
VLDL Cholesterol Cal: 33 mg/dL (ref 5–40)

## 2019-05-25 LAB — ANEMIA PROFILE B
Ferritin: 56 ng/mL (ref 30–400)
Folate: 14.2 ng/mL (ref >3.0)
Iron Saturation: 33 % (ref 15–55)
Iron: 130 ug/dL (ref 38–169)
Total Iron Binding Capacity: 398 ug/dL (ref 250–450)
UIBC: 268 ug/dL (ref 111–343)
Vitamin B-12: 648 pg/mL (ref 232–1245)

## 2019-05-25 LAB — PSA, TOTAL AND FREE
PSA, Free Pct: 28.6 %
PSA, Free: 0.2 ng/mL
Prostate Specific Ag, Serum: 0.7 ng/mL (ref 0.0–4.0)

## 2019-05-25 LAB — TSH: TSH: 1.69 u[IU]/mL (ref 0.450–4.500)

## 2019-05-25 NOTE — Telephone Encounter (Signed)
Patient has appt follow up scheduled

## 2019-05-26 ENCOUNTER — Other Ambulatory Visit: Payer: Self-pay | Admitting: Family

## 2019-05-27 NOTE — Telephone Encounter (Signed)
LMOVM if pt knew which meter his ins pd for & how many times a day he tested his BS

## 2019-05-28 ENCOUNTER — Ambulatory Visit (HOSPITAL_COMMUNITY)
Admission: RE | Admit: 2019-05-28 | Discharge: 2019-05-28 | Disposition: A | Discharge status: Home / Self Care | Payer: Medicare Other | Source: Ambulatory Visit | Attending: Family | Admitting: Family

## 2019-05-28 ENCOUNTER — Telehealth: Payer: Self-pay | Admitting: Family

## 2019-05-28 ENCOUNTER — Other Ambulatory Visit: Payer: Self-pay

## 2019-05-28 DIAGNOSIS — R634 Abnormal weight loss: Secondary | ICD-10-CM | POA: Diagnosis present

## 2019-05-28 DIAGNOSIS — E049 Nontoxic goiter, unspecified: Secondary | ICD-10-CM | POA: Insufficient documentation

## 2019-05-28 DIAGNOSIS — R5383 Other fatigue: Secondary | ICD-10-CM | POA: Insufficient documentation

## 2019-05-28 NOTE — Telephone Encounter (Signed)
FYI

## 2019-05-31 NOTE — Telephone Encounter (Signed)
Ok, good. He still needs to be seen to have lab work rechecked! This week.

## 2019-05-31 NOTE — Telephone Encounter (Signed)
Patient aware and verbalized understanding. °

## 2019-06-23 ENCOUNTER — Other Ambulatory Visit: Payer: Self-pay

## 2019-06-24 ENCOUNTER — Other Ambulatory Visit: Payer: Self-pay | Admitting: *Deleted

## 2019-06-24 ENCOUNTER — Ambulatory Visit (INDEPENDENT_AMBULATORY_CARE_PROVIDER_SITE_OTHER): Payer: Medicare Other | Admitting: Family

## 2019-06-24 ENCOUNTER — Encounter: Payer: Self-pay | Admitting: Family

## 2019-06-24 VITALS — BP 119/77 | HR 69 | Temp 97.8°F | Ht 68.0 in | Wt 154.0 lb

## 2019-06-24 DIAGNOSIS — R309 Painful micturition, unspecified: Secondary | ICD-10-CM | POA: Diagnosis not present

## 2019-06-24 DIAGNOSIS — E1169 Type 2 diabetes mellitus with other specified complication: Secondary | ICD-10-CM

## 2019-06-24 LAB — URINALYSIS, COMPLETE
Bilirubin, UA: NEGATIVE
Leukocytes,UA: NEGATIVE
Nitrite, UA: NEGATIVE
Protein,UA: NEGATIVE
RBC, UA: NEGATIVE
Specific Gravity, UA: 1.02 (ref 1.005–1.030)
Urobilinogen, Ur: 1 mg/dL (ref 0.2–1.0)
pH, UA: 5.5 (ref 5.0–7.5)

## 2019-06-24 LAB — MICROSCOPIC EXAMINATION
Bacteria, UA: NONE SEEN
Renal Epithel, UA: NONE SEEN /hpf
WBC, UA: NONE SEEN /hpf (ref 0–5)

## 2019-06-24 LAB — GLUCOSE HEMOCUE WAIVED: Glu Hemocue Waived: 245 mg/dL — ABNORMAL HIGH (ref 65–99)

## 2019-06-24 LAB — BAYER DCA HB A1C WAIVED: HB A1C (BAYER DCA - WAIVED): 11.4 % — ABNORMAL HIGH (ref <7.0)

## 2019-06-24 MED ORDER — BLOOD GLUCOSE METER KIT: PACK | 0 refills | Status: DC

## 2019-06-24 MED ORDER — TRESIBA FLEXTOUCH 100 UNIT/ML ~~LOC~~ SOPN: 15.0000 [IU] | PEN_INJECTOR | Freq: Every day | SUBCUTANEOUS | 1 refills | Status: DC

## 2019-06-24 NOTE — Patient Instructions (Signed)

## 2019-06-24 NOTE — Progress Notes (Signed)
Subjective:    Patient ID: Thomas Pratt, male    DOB: 05-07-1961, 58 y.o.   MRN: [id number]  Chief Complaint  Patient presents with  . Diabetes    needs printed script for blood sugar meter and supplies  . pain passing urine   Pt presents to the office today for follow up on DM. He was seen on 05/20/19 with complaints of fatigue and found to have an A1C of >14 and glucose of 460. He reports since starting [place] he has felt much better and his glucose has been 150's.   Has appt with Diabetic Educator on 07/12/19 Diabetes He presents for his follow-up diabetic visit. He has type 2 diabetes mellitus. His disease course has been improving. There are no hypoglycemic associated symptoms. Pertinent negatives for hypoglycemia include no confusion or hunger. Pertinent negatives for diabetes include no blurred vision. Symptoms are improving. Pertinent negatives for diabetic complications include no CVA, heart disease, nephropathy or peripheral neuropathy. Risk factors for coronary artery disease include diabetes mellitus, male sex, hypertension and sedentary lifestyle. He is following a generally unhealthy diet. His overall blood glucose range is 130-140 mg/dl.  Dysuria  This is a new problem. The current episode started in the past 7 days. The problem occurs intermittently. The quality of the pain is described as burning. The pain is at a severity of 8/10. The pain is mild. Associated symptoms include flank pain and frequency. Pertinent negatives include no hematuria, nausea or urgency. He has tried increased fluids for the symptoms.      Review of Systems  Eyes: Negative for blurred vision.  Gastrointestinal: Negative for nausea.  Genitourinary: Positive for dysuria, flank pain and frequency. Negative for hematuria and urgency.  Psychiatric/Behavioral: Negative for confusion.  All other systems reviewed and are negative.      Objective:   Physical Exam Vitals signs reviewed.   Constitutional:      General: He is not in acute distress.    Appearance: He is well-developed.  HENT:     Head: Normocephalic.     Right Ear: Tympanic membrane normal.     Left Ear: Tympanic membrane normal.  Eyes:     General:        Right eye: No discharge.        Left eye: No discharge.     Pupils: Pupils are equal, round, and reactive to light.  Neck:     Musculoskeletal: Normal range of motion and neck supple.     Thyroid: No thyromegaly.  Cardiovascular:     Rate and Rhythm: Normal rate and regular rhythm.     Heart sounds: Normal heart sounds. No murmur.  Pulmonary:     Effort: Pulmonary effort is normal. No respiratory distress.     Breath sounds: Normal breath sounds. No wheezing.  Abdominal:     General: Bowel sounds are normal. There is no distension.     Palpations: Abdomen is soft.     Tenderness: There is no abdominal tenderness.  Musculoskeletal: Normal range of motion.        General: No tenderness.  Skin:    General: Skin is warm and dry.     Findings: No erythema or rash.  Neurological:     Mental Status: He is alert and oriented to person, place, and time.     Cranial Nerves: No cranial nerve deficit.     Deep Tendon Reflexes: Reflexes are normal and symmetric.  Psychiatric:  Behavior: Behavior normal.        Thought Content: Thought content normal.        Judgment: Judgment normal.        BP 119/77   Pulse 69   Temp 97.8 F (36.6 C) (Temporal)   Ht _0  (1.727 m)   Wt 154 lb (69.9 kg)   SpO2 99%   BMI 23.42 kg/m   Assessment & Plan:  Thomas Pratt comes in today with chief complaint of Diabetes (needs printed script for blood sugar meter and supplies) and pain passing urine   Diagnosis and orders addressed:  1. Pain passing urine - urinalysis- dip and micro - FINGERSTICK BLOOD SUGAR - CMP14+EGFR - CBC with Differential/Platelet  2. Type 2 diabetes mellitus with other specified complication, without long-term current use  of insulin (HCC) Will increase Tresiba to 15 units Strict low carb diet, diet discussed Education provided  Follow up in 1 month  - blood glucose meter kit and supplies; Dispense based on patient and insurance preference. Use up to four times daily as directed. (FOR ICD-10 E10.9, E11.9).  Dispense: 1 each; Refill: 0 - FINGERSTICK BLOOD SUGAR - CMP14+EGFR - CBC with Differential/Platelet - insulin degludec (TRESIBA FLEXTOUCH) 100 UNIT/ML SOPN FlexTouch Pen; Inject 0.15 mLs (15 Units total) into the skin daily.  Dispense: 1 pen; Refill: [place], FNP

## 2019-06-25 LAB — CMP14+EGFR
ALT: 57 IU/L — ABNORMAL HIGH (ref 0–44)
AST: 44 IU/L — ABNORMAL HIGH (ref 0–40)
Albumin/Globulin Ratio: 1.5 (ref 1.2–2.2)
Albumin: 4.7 g/dL (ref 3.8–4.9)
Alkaline Phosphatase: 105 IU/L (ref 39–117)
BUN/Creatinine Ratio: 11 (ref 9–20)
BUN: 10 mg/dL (ref 6–24)
Bilirubin Total: 0.8 mg/dL (ref 0.0–1.2)
CO2: 24 mmol/L (ref 20–29)
Calcium: 10 mg/dL (ref 8.7–10.2)
Chloride: 97 mmol/L (ref 96–106)
Creatinine, Ser: 0.95 mg/dL (ref 0.76–1.27)
GFR calc Af Amer: 101 mL/min/{1.73_m2} (ref >59)
GFR calc non Af Amer: 87 mL/min/{1.73_m2} (ref >59)
Globulin, Total: 3.1 g/dL (ref 1.5–4.5)
Glucose: 247 mg/dL — ABNORMAL HIGH (ref 65–99)
Potassium: 4.6 mmol/L (ref 3.5–5.2)
Sodium: 134 mmol/L (ref 134–144)
Total Protein: 7.8 g/dL (ref 6.0–8.5)

## 2019-06-25 LAB — CBC WITH DIFFERENTIAL/PLATELET
Basophils Absolute: 0 10*3/uL (ref 0.0–0.2)
Basos: 1 %
EOS (ABSOLUTE): 0.1 10*3/uL (ref 0.0–0.4)
Eos: 1 %
Hematocrit: 44.5 % (ref 37.5–51.0)
Hemoglobin: 15.8 g/dL (ref 13.0–17.7)
Immature Grans (Abs): 0 10*3/uL (ref 0.0–0.1)
Immature Granulocytes: 0 %
Lymphocytes Absolute: 1.6 10*3/uL (ref 0.7–3.1)
Lymphs: 29 %
MCH: 32.8 pg (ref 26.6–33.0)
MCHC: 35.5 g/dL (ref 31.5–35.7)
MCV: 93 fL (ref 79–97)
Monocytes Absolute: 0.3 10*3/uL (ref 0.1–0.9)
Monocytes: 6 %
Neutrophils Absolute: 3.5 10*3/uL (ref 1.4–7.0)
Neutrophils: 63 %
Platelets: 180 10*3/uL (ref 150–450)
RBC: 4.81 x10E6/uL (ref 4.14–5.80)
RDW: 11.9 % (ref 11.6–15.4)
WBC: 5.6 10*3/uL (ref 3.4–10.8)

## 2019-07-06 ENCOUNTER — Other Ambulatory Visit: Payer: Self-pay | Admitting: Family

## 2019-07-12 ENCOUNTER — Other Ambulatory Visit: Payer: Self-pay

## 2019-07-12 ENCOUNTER — Encounter: Payer: Self-pay | Admitting: Nutrition

## 2019-07-12 ENCOUNTER — Encounter: Payer: Medicare Other | Attending: Family | Admitting: Nutrition

## 2019-07-12 VITALS — Ht 68.0 in | Wt 162.6 lb

## 2019-07-12 DIAGNOSIS — K7030 Alcoholic cirrhosis of liver without ascites: Secondary | ICD-10-CM | POA: Insufficient documentation

## 2019-07-12 DIAGNOSIS — E1169 Type 2 diabetes mellitus with other specified complication: Secondary | ICD-10-CM | POA: Insufficient documentation

## 2019-07-12 DIAGNOSIS — R1319 Other dysphagia: Secondary | ICD-10-CM

## 2019-07-12 DIAGNOSIS — R1310 Dysphagia, unspecified: Secondary | ICD-10-CM | POA: Diagnosis present

## 2019-07-12 NOTE — Patient Instructions (Signed)
Goals Follow the Plate Method  Take tip off insulin pen and inject 10 units nightly in stomach. Eat meals on time Eat 3-4 carb choices per meal Drink water Stay active Test blood sugars 4 times a day and call in 1 week

## 2019-07-12 NOTE — Progress Notes (Signed)
Medical Nutrition Therapy:  Appt start time: 1330 end time:  1430.   Assessment:  Primary concerns today: Diabetes Type 2 New Diagnosed.Thomas KitchenHisotry of alcohol abuse and dsyphagia issues. Sees GI, Dr.Rourk. 15 units Tyler Aas has been ordered by PCP. Sees [employer].  FBS:200's Before lunch 300's   Before supper 300-400  Bedtime 300-400's. He reports that the liquid in the pen was leaking out. However, he didn't know to take off the plastic tip of the insulin needle to inject into his skin. Therefore, he hasn' not been technically been giving himself insulin because the cap has been on. He is eating 3-4 times per day.Thomas Pratt He notes he is gaining weight about 6-8 lbs. Has blurry vision, frequent urination, headache and fatigue.  He has an eye dr appt within the next month Sees PCP again in January 2021. Will test blood sugars 4 times a day and call with results in 1 week Willing and motivated to make proper changes with diet and proper insulin administration.  .  Preferred Learning Style:  Auditory  Visual  Hands on  Learning Readiness:   Ready  Change in progress   MEDICATIONS:    DIETARY INTAKE: .    24-hr recall:  B ( AM):2 eggs and 2 slice toast water Snk ( AM):  L ( PM):2 pack ramen noodles, water Snk ( PM):  D ( PM): Meat and some vegetables, water,  Snk ( PM):  Beverages: water  Usual physical activity: yard work.  Estimated energy needs: 2000  calories 225 g carbohydrates 150 g protein 58 g fat  Progress Towards Goal(s):  In progress.   Nutritional Diagnosis:  NB-1.1 Food and nutrition-related knowledge deficit As related to Diabetes Type 2.  As evidenced by A1C > 10%.    Intervention:  Nutrition and Diabetes education provided on My Plate, CHO counting, meal planning, portion sizes, timing of meals, avoiding snacks between meals unless having a low blood sugar, target ranges for A1C and blood sugars, signs/symptoms and treatment of hyper/hypoglycemia,  monitoring blood sugars, taking medications as prescribed, benefits of exercising 30 minutes per day and prevention of complications of DM. Insulin Instruction  Patient was seen on Tresiba for insulin instruction.  The following learning objectives were met by the patient during this visit:   Insulin Action of Tresiba insulins  Reviewed syringe & vial VS pen including # units per syringe,    length of needles, vial VS Pen cartridge and needles  Hygiene and storage  Drawing up single and mixed doses if using vials   Single dose  Rotation of Sites  :  Hypoglycemia- symptoms, causes , treatment choices  Record keeping and MD follow up  Hypoglycemia, causes, symptoms and treatment   Patient demonstrated understanding of insulin administration by return demonstration.  Patient received the following handouts:  Insulin Instruction Handout                              Patient to start on insulin as Rx'd by MD  Goals Take 10 units of Tresiba x 3 days and call with BS results. Follow the Plate Method  Take tip off insulin pen and inject 10 units nightly in stomach. Eat meals on time Eat 3-4 carb choices per meal Increase lower carb vegetables with lunch and dinner Drink water Stay active Test blood sugars 4 times a day and call in 1 week  Teaching Method Utilized:  Visual Auditory Hands on  Handouts given during visit include:  The Plate Method  Meal Plan  Diabetes Instructions.   Barriers to learning/adherence to lifestyle change: none  Demonstrated degree of understanding via:  Teach Back   Monitoring/Evaluation:  Dietary intake, exercise, , and body weight in 1 month(s)  Will follow up with phone call in 1 week.Thomas Pratt

## 2019-07-19 ENCOUNTER — Other Ambulatory Visit: Payer: Self-pay | Admitting: Family

## 2019-07-26 ENCOUNTER — Telehealth: Payer: Self-pay

## 2019-07-26 NOTE — Telephone Encounter (Signed)
Xifaxan has been approved through [email or web address]. Approval letter will be scanned in chart when received.

## 2019-07-26 NOTE — Telephone Encounter (Signed)
PA was submitted through [email or web address] for Xifaxan 550 mg. Waiting on an approval or denial.

## 2019-07-29 ENCOUNTER — Other Ambulatory Visit: Payer: Self-pay

## 2019-07-30 ENCOUNTER — Ambulatory Visit (INDEPENDENT_AMBULATORY_CARE_PROVIDER_SITE_OTHER): Payer: Medicare Other | Admitting: Family

## 2019-07-30 ENCOUNTER — Encounter: Payer: Self-pay | Admitting: Family

## 2019-07-30 VITALS — BP 112/67 | HR 73 | Temp 97.8°F | Ht 68.0 in | Wt 164.8 lb

## 2019-07-30 DIAGNOSIS — E1169 Type 2 diabetes mellitus with other specified complication: Secondary | ICD-10-CM | POA: Diagnosis not present

## 2019-07-30 MED ORDER — METFORMIN HCL ER 750 MG PO TB24: 750.0000 mg | ORAL_TABLET | Freq: Every day | ORAL | 1 refills | Status: DC

## 2019-07-30 MED ORDER — BD PEN NEEDLE MINI U/F 31G X 5 MM MISC: 11 refills | Status: DC

## 2019-07-30 NOTE — Progress Notes (Signed)
Subjective:    Patient ID: Thomas Pratt, male    DOB: 11-30-1960, 59 y.o.   MRN: [id number]  Chief Complaint  Patient presents with  . Medical Management of Chronic Issues   PT presents to the office today to recheck DM. He had a nutrition and diabetic educator appointment on 07/12/19. He states this has helped. He has follow up on 08/10/19. Diabetes He presents for his follow-up diabetic visit. He has type 2 diabetes mellitus. His disease course has been improving. There are no hypoglycemic associated symptoms. Associated symptoms include foot paresthesias. Symptoms are improving. Diabetic complications include peripheral neuropathy. Risk factors for coronary artery disease include dyslipidemia, diabetes mellitus, male sex and sedentary lifestyle. He is following a generally healthy diet. His overall blood glucose range is >200 mg/dl. Eye exam current: has scheduled.      Review of Systems  All other systems reviewed and are negative.      Objective:   Physical Exam Vitals reviewed.  Constitutional:      General: He is not in acute distress.    Appearance: He is well-developed.  HENT:     Head: Normocephalic.     Right Ear: Tympanic membrane normal.     Left Ear: Tympanic membrane normal.  Eyes:     General:        Right eye: No discharge.        Left eye: No discharge.     Pupils: Pupils are equal, round, and reactive to light.  Neck:     Thyroid: No thyromegaly.  Cardiovascular:     Rate and Rhythm: Normal rate and regular rhythm.     Heart sounds: Normal heart sounds. No murmur.  Pulmonary:     Effort: Pulmonary effort is normal. No respiratory distress.     Breath sounds: Normal breath sounds. No wheezing.  Abdominal:     General: Bowel sounds are normal. There is no distension.     Palpations: Abdomen is soft.     Tenderness: There is no abdominal tenderness.  Musculoskeletal:        General: No tenderness. Normal range of motion.     Cervical back:  Normal range of motion and neck supple.  Skin:    General: Skin is warm and dry.     Findings: No erythema or rash.  Neurological:     Mental Status: He is alert and oriented to person, place, and time.     Cranial Nerves: No cranial nerve deficit.     Deep Tendon Reflexes: Reflexes are normal and symmetric.  Psychiatric:        Behavior: Behavior normal.        Thought Content: Thought content normal.        Judgment: Judgment normal.     BP 112/67   Pulse 73   Temp 97.8 F (36.6 C) (Temporal)   Ht _0  (1.727 m)   Wt 164 lb 12.8 oz (74.8 kg)   SpO2 99%   BMI 25.06 kg/m        Assessment & Plan:  Thomas Pratt comes in today with chief complaint of Diabetes   Diagnosis and orders addressed:  1. Type 2 diabetes mellitus with other specified complication, unspecified whether long term insulin use (HCC) Will add metformin today and continue tresiba 15 units daily.  Strict low carb diet Keep Diabetic education follow up RTO in 2 weeks to recheck and dose increase - metFORMIN (GLUCOPHAGE XR) 750 MG 24 hr tablet; Take  1 tablet (750 mg total) by mouth daily with breakfast.  Dispense: 90 tablet; Refill: 1 - BMP8+EGFR - B-D UF III MINI PEN NEEDLES 31G X 5 MM MISC; USE WITH TRESIBA  Dispense: 100 each; Refill: 11   Labs pending Health Maintenance reviewed Diet and exercise encouraged  Follow up plan: 2 weeks to recheck DM   Evelina Dun, FNP

## 2019-07-30 NOTE — Patient Instructions (Signed)

## 2019-08-03 ENCOUNTER — Other Ambulatory Visit: Payer: Self-pay | Admitting: Family

## 2019-08-03 DIAGNOSIS — E1169 Type 2 diabetes mellitus with other specified complication: Secondary | ICD-10-CM

## 2019-08-10 ENCOUNTER — Encounter: Payer: Self-pay | Admitting: Nutrition

## 2019-08-10 ENCOUNTER — Encounter: Payer: Medicare Other | Attending: Family | Admitting: Nutrition

## 2019-08-10 ENCOUNTER — Other Ambulatory Visit: Payer: Self-pay

## 2019-08-10 VITALS — Ht 68.0 in | Wt 167.0 lb

## 2019-08-10 DIAGNOSIS — E1169 Type 2 diabetes mellitus with other specified complication: Secondary | ICD-10-CM | POA: Diagnosis not present

## 2019-08-10 DIAGNOSIS — K7030 Alcoholic cirrhosis of liver without ascites: Secondary | ICD-10-CM | POA: Insufficient documentation

## 2019-08-10 NOTE — Progress Notes (Signed)
Medical Nutrition Therapy:  Appt start time: 0076 end time:  2263   Assessment:  Primary concerns today: Diabetes Type 2 New Diagnosed. Still taking 15 units of Tresiba and Metformin 750. Changed cooking to baked and grilled. BS are better 170-290's. Making progress. Food insecurity. Taking the cap off his insulin now. BS are better. Trying to eat three meals per day.  CMP Latest Ref Rng & Units 06/24/2019 05/20/2019 04/13/2019  Glucose 65 - 99 mg/dL 247(H) 460(H) -  BUN 6 - 24 mg/dL 10 17 -  Creatinine 0.76 - 1.27 mg/dL 0.95 1.06 -  Sodium 134 - 144 mmol/L 134 134 -  Potassium 3.5 - 5.2 mmol/L 4.6 5.0 -  Chloride 96 - 106 mmol/L 97 93(L) -  CO2 20 - 29 mmol/L 24 21 -  Calcium 8.7 - 10.2 mg/dL 10.0 10.7(H) -  Total Protein 6.0 - 8.5 g/dL 7.8 8.1 8.1  Total Bilirubin 0.0 - 1.2 mg/dL 0.8 0.6 1.1  Alkaline Phos 39 - 117 IU/L 105 131(H) 92  AST 0 - 40 IU/L 44(H) 50(H) 43(H)  ALT 0 - 44 IU/L 57(H) 72(H) 52(H)   Lab Results  Component Value Date   HGBA1C 11.4 (H) 06/24/2019   Preferred Learning Style:  Auditory  Visual  Hands on  Learning Readiness:   Ready  Change in progress   MEDICATIONS:    DIETARY INTAKE: .    24-hr recall:  B ( AM):2 eggs and 2 slice toast water Snk ( AM):  L ( PM): vegetable salad, chicken,  Water, apple, Snk ( PM):  D ( PM): Meat and some vegetables, water,  Snk ( PM):  Beverages: water  Usual physical activity: yard work.  Estimated energy needs: 2000  calories 225 g carbohydrates 150 g protein 58 g fat  Progress Towards Goal(s):  In progress.   Nutritional Diagnosis:  NB-1.1 Food and nutrition-related knowledge deficit As related to Diabetes Type 2.  As evidenced by A1C > 10%.    Intervention:  Nutrition and Diabetes education provided on My Plate, CHO counting, meal planning, portion sizes, timing of meals, avoiding snacks between meals unless having a low blood sugar, target ranges for A1C and blood sugars, signs/symptoms and  treatment of hyper/hypoglycemia, monitoring blood sugars, taking medications as prescribed, benefits of exercising 30 minutes per day and prevention of complications of DM. Insulin Instruction  Patient was seen on Tresiba for insulin instruction.  The following learning objectives were met by the patient during this visit:   Insulin Action of Tresiba insulins  Reviewed syringe & vial VS pen including # units per syringe,    length of needles, vial VS Pen cartridge and needles  Hygiene and storage  Drawing up single and mixed doses if using vials   Single dose  Rotation of Sites  :  Hypoglycemia- symptoms, causes , treatment choices  Record keeping and MD follow up  Hypoglycemia, causes, symptoms and treatment   Patient demonstrated understanding of insulin administration by return demonstration.  Patient received the following handouts:  Insulin Instruction Handout                              Patient to start on insulin as Rx'd by MD  Goals Eat three meals per day at times discussed. Do not skip meals Avoid snacks Increase fresh fruits and vegetables. Drink only water Get A1C down to 7%.    Teaching Method Utilized:  [employer]  Hands on  Handouts given during visit include:  The Plate Method  Meal Plan  Diabetes Instructions.   Barriers to learning/adherence to lifestyle change: none  Demonstrated degree of understanding via:  Teach Back   Monitoring/Evaluation:  Dietary intake, exercise, , and body weight in 2 month(s)

## 2019-08-12 ENCOUNTER — Other Ambulatory Visit: Payer: Self-pay | Admitting: Family

## 2019-08-12 LAB — HM DIABETES EYE EXAM

## 2019-08-16 ENCOUNTER — Other Ambulatory Visit: Payer: Self-pay | Admitting: Family

## 2019-08-23 ENCOUNTER — Telehealth: Payer: Self-pay | Admitting: Family

## 2019-08-24 ENCOUNTER — Encounter: Payer: Self-pay | Admitting: Nutrition

## 2019-08-24 NOTE — Patient Instructions (Signed)
Goals Eat three meals per day at times discussed. Do not skip meals Avoid snacks Increase fresh fruits and vegetables. Drink only water Get A1C down to 7%.

## 2019-08-26 ENCOUNTER — Other Ambulatory Visit: Payer: Self-pay | Admitting: Family

## 2019-08-30 ENCOUNTER — Other Ambulatory Visit: Payer: Self-pay

## 2019-08-31 ENCOUNTER — Ambulatory Visit: Payer: Medicare Other | Admitting: Family

## 2019-08-31 NOTE — Telephone Encounter (Signed)
Pt states that these symptoms started about 2 weeks ago. He does rotate sides of the stomach. The redness and pain will go away after about 45 min but happens after each injection. When he injects his legs no symptoms. Medication does run out sometimes on the abdomen.

## 2019-08-31 NOTE — Telephone Encounter (Signed)
Patient notified it is okay to give shot in legs.

## 2019-08-31 NOTE — Telephone Encounter (Signed)
Ok, to give insulin in legs then.

## 2019-09-01 ENCOUNTER — Ambulatory Visit (INDEPENDENT_AMBULATORY_CARE_PROVIDER_SITE_OTHER): Payer: Medicare Other | Admitting: Nurse Practitioner

## 2019-09-01 ENCOUNTER — Other Ambulatory Visit: Payer: Self-pay

## 2019-09-01 ENCOUNTER — Encounter: Payer: Self-pay | Admitting: Nurse Practitioner

## 2019-09-01 VITALS — BP 130/80 | HR 75 | Temp 97.5°F | Ht 68.0 in | Wt 166.8 lb

## 2019-09-01 DIAGNOSIS — F1011 Alcohol abuse, in remission: Secondary | ICD-10-CM | POA: Diagnosis not present

## 2019-09-01 DIAGNOSIS — R1319 Other dysphagia: Secondary | ICD-10-CM

## 2019-09-01 DIAGNOSIS — K7460 Unspecified cirrhosis of liver: Secondary | ICD-10-CM

## 2019-09-01 DIAGNOSIS — R1310 Dysphagia, unspecified: Secondary | ICD-10-CM | POA: Diagnosis not present

## 2019-09-01 NOTE — Progress Notes (Signed)
Referring Provider: Sharion Balloon, FNP Primary Care Physician:  Sharion Balloon, FNP Primary GI:  Dr. Gala Romney  Chief Complaint  Patient presents with  . Abdominal Pain    cirrhosis    HPI:   Thomas Pratt is a 59 y.o. male who presents for follow-up on alcoholic cirrhosis.  The patient was last seen in our office 03/01/2019 for the same as well as dysphagia and lower extremity edema.  He has been abstinent from alcohol for the past approximate 3 years.  History of positive ANA for presumed PBC as well as elevated alk phos and was subsequently started on Urso.  EGD and colonoscopy were up-to-date 2018.  On Lasix and Aldactone 40 mg / 100 mg.  Evaluated by [facility] liver transplant program who recommended continue Corgard 40 mg daily and 20 mg in the afternoon, lactulose, rifaximin by local GI as well as diuretics and Urso but recommended no further Urso refills until liver biopsy returned.  Require 6 months of alcohol/substance abuse counseling prior to evaluation for transplant.  He was having some persistent abdominal discomfort left lower quadrant deemed likely due to scar tissue but with limited surgical options.  Lifestyle changes recommended.  He was making a concerted effort to get into alcohol abuse counseling.  At his last visit noted some mild lower extremity edema and persistent lower abdominal pain with regular bowel movements 3-4 times a day upon rifaximin.  Noted solid food dysphagia 2-3 times a week.  No overt GERD symptoms.  20 pound weight loss in the past 6 months although his edema is improved and likely somewhat due to fluid loss.  Some headaches that feel worse when his vision is blurred, has not had his labs checked recently.  No other overt hepatic or GI symptoms.  Recommended continue attempts to get established with a 29-monthcounseling program, update labs and imaging, continue current diabetic regimen and nadolol/Xifaxan.  Follow-up in 6 months.  Recommended upper  endoscopy with possible dilation.  Labs completed 03/10/2019 which found CBC normal (platelet count 190), CMP with mild hyponatremia at 132, normal creatinine, mild transaminitis with AST/ALT of 43/54, normal alkaline phosphatase of 91, mildly elevated bilirubin at 1.3.  INR normal at 1.0.  AFP normal.  Meld score calculated at 14, child Pugh class A.  Recommended recheck LFTs in 4 weeks.  HFP completed 04/13/2019 found persistent but stable transaminitis with AST/ALT of 46/52, normal alkaline phosphatase 92, normalized bilirubin at 1.1.  Right upper quadrant ultrasound completed 03/10/2019 with noted cirrhosis without discrete liver lesions.  EGD completed 05/03/2019 which found grade 2 esophageal varices, status post dilation of esophagus, portal hypertensive gastropathy, normal duodenum.  Recommended continue medications including Dexilant 60 mg daily.  Today he states he's doing ok overall. Still with persistent abdominal discomfort, similar to previous. Pain restarted about a month ago. Swallowing improved after EGD. Abdominal pain is 7-9/10, intermittent about every 3 days, lasts about 2-3 hours; typically post-prandial. Still on Dexilant, no GERD symptoms. Has a bowel movement about 3 times a day. On Xifaxan, off lactulose. Denies N/V, hematochezia, melena, fever, chills, unintentional weight loss. He is starting to put weight back on, was recently diagnosed with DM and it is felt this was (at least partly) responsible for his weight loss. He checks his CBGs about 6 times a day and is on DM medication currently. Denies yellowing of skin/eyes, darkened urine, tremors/shakes, generalized pruritis, acute episodic confusion. Denies URI or flu-like symptoms. Denies loss of sense of  taste or smell. Was COVID-tested prior to EGD and was negative. Denies chest pain, dyspnea, dizziness, lightheadedness, syncope, near syncope. Does have localized pruritis from dry skin and using a cream to help. Denies any other  upper or lower GI symptoms.  He still hasn't been able to get established with a 35-monthETOH counseling program. States [name] too far. Query availability of CDundy(Brandon [phone].   At this point he is sober 3.5 years.  Past Medical History:  Diagnosis Date  . [place]    likely ETOH use   . Diabetes ([facility]     Past Surgical History:  Procedure Laterality Date  . CHOLECYSTECTOMY N/A 09/01/2017   Procedure: LAPAROSCOPIC CHOLECYSTECTOMY;  Surgeon: JAviva Signs MD;  Location: [facility] ORS;  Service: General;  Laterality: N/A;  . COLONOSCOPY WITH PROPOFOL N/A 03/06/2017   Dr. RGala Romney tubular adenoma removed, next TCS 5 years if health permits.   . ESOPHAGOGASTRODUODENOSCOPY (EGD) WITH PROPOFOL N/A 03/06/2017   Dr. RGala Romney grade 1 esophageal varices, erosive esophagitis, portal gastropathy.   . ESOPHAGOGASTRODUODENOSCOPY (EGD) WITH PROPOFOL N/A 05/03/2019   Procedure: ESOPHAGOGASTRODUODENOSCOPY (EGD) WITH PROPOFOL;  Surgeon: RDaneil Dolin MD;  Location: [facility] ENDO SUITE;  Service: Endoscopy;  Laterality: N/A;  10:30am  . HERNIA REPAIR Right    RIH  . INGUINAL HERNIA REPAIR Right 07/11/2017   Procedure: HERNIA REPAIR INGUINAL ADULT WITH MESH;  Surgeon: JAviva Signs MD;  Location: [facility] ORS;  Service: General;  Laterality: Right;  patient knows to arrive at 7:30  . IR TRANSCATHETER BX  10/14/2017  . IR [place]IDE VASC ACCESS RIGHT  10/14/2017  . IR VENOGRAM HEPATIC WO HEMODYNAMIC EVALUATION  10/14/2017  . MALONEY DILATION N/A 05/03/2019   Procedure: MVenia MinksDILATION;  Surgeon: RDaneil Dolin MD;  Location: [facility] ENDO SUITE;  Service: Endoscopy;  Laterality: N/A;  . POLYPECTOMY  03/06/2017   Procedure: POLYPECTOMY;  Surgeon: RDaneil Dolin MD;  Location: [facility] END[facility] SUITE;  Service: Endoscopy;;  ascending colon;    Current Outpatient Medications  Medication Sig Dispense Refill  . atorvastatin (LIPITOR) 10 MG tablet TAKE 1 TABLET BY MOUTH  DAILY (Patient  taking differently: 15 mg. ) 90 tablet 1  . blood glucose meter kit and supplies Dispense based on patient and insurance preference. Use up to four times daily as directed. (FOR ICD-10 E10.9, E11.9). 1 each 0  . cetirizine (ZYRTEC) 10 MG tablet Take 1 tablet (10 mg total) by mouth daily. 30 tablet 11  . DEXILANT 60 MG capsule TAKE 1 CAPSULE BY MOUTH  DAILY 90 capsule 3  . escitalopram (LEXAPRO) 10 MG tablet Take 1 tablet (10 mg total) by mouth daily. 90 tablet 3  . fluticasone (FLONASE) 50 MCG/ACT nasal spray Place 2 sprays into both nostrils daily. 16 g 6  . insulin degludec (TRESIBA FLEXTOUCH) 100 UNIT/ML SOPN FlexTouch Pen Inject 0.15 mLs (15 Units total) into the skin daily. 1 pen 1  . Insulin Pen Needle (B-D UF III MINI PEN NEEDLES) 31G X 5 MM MISC USE WITH TRESIBA daily Dx E11.69 100 each 3  . Lancets (ONETOUCH DELICA PLUS [id number] MISC 1 each by Other route 4 (four) times daily. Dx E11.69 100 each 0  . metFORMIN (GLUCOPHAGE XR) 750 MG 24 hr tablet Take 1 tablet (750 mg total) by mouth daily with breakfast. 90 tablet 1  . ONETOUCH ULTRA test strip USE 1 STRIP TO CHECK GLUCOSE 4 TIMES DAILY 100 each 0  . spironolactone (ALDACTONE) 50  MG tablet Take 1 tablet (50 mg total) by mouth daily. 90 tablet 4  . VITAMIN D PO Take by mouth daily. Unsure of strength     No current facility-administered medications for this visit.    Allergies as of 09/01/2019  . (No Known Allergies)    Family History  Problem Relation Age of Onset  . COPD Mother   . Liver disease Neg Hx   . Colon cancer Neg Hx   . Colon polyps Neg Hx     Social History   Socioeconomic History  . Marital status: Divorced    Spouse name: Not on file  . Number of children: Not on file  . Years of education: Not on file  . Highest education level: Not on file  Occupational History  . Occupation: unemployed    Comment: disability  Tobacco Use  . Smoking status: Never Smoker  . Smokeless tobacco: Never Used  Substance  and Sexual Activity  . Alcohol use: No    Comment: Quit September 2017, heavy alcohol abuse previously  . Drug use: No  . Sexual activity: Not Currently    Partners: Female  Other Topics Concern  . Not on file  Social History Narrative   Lives in [place], [place]. 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    Drank alcohol for years, quit 05/07/16. Has been sober since then. Quit b/c of health/cirrhosis.   Divorced for over 15 years.   Dating sporadically.   Has 3 daughters and 1 son live locally.   Originally from [place], moved here b/c of wife.    Eats all foods.    Wear seatbelt.   Does not drive, does not have a license, lost due to DWI.    Brought by neighbor.    Does not go to church. Believes in God. Prays each night, per report.    Social Determinants of Health   Financial Resource Strain:   . Difficulty of Paying Living Expenses: Not on file  Food Insecurity:   . Worried About Charity fundraiser in the Last Year: Not on file  . Ran Out of Food in the Last Year: Not on file  Transportation Needs:   . Lack of Transportation (Medical): Not on file  . Lack of Transportation (Non-Medical): Not on file  Physical Activity:   . Days of Exercise per Week: Not on file  . Minutes of Exercise per Session: Not on file  Stress:   . Feeling of Stress : Not on file  Social Connections:   . Frequency of Communication with Friends and Family: Not on file  . Frequency of Social Gatherings with Friends and Family: Not on file  . Attends Religious Services: Not on file  . Active Member of Clubs or Organizations: Not on file  . Attends Archivist Meetings: Not on file  . Marital Status: Not on file    Review of Systems: General: Negative for anorexia, weight loss, fever, chills, fatigue, weakness. ENT: Negative for hoarseness, difficulty swallowing. CV: Negative for chest pain, angina, palpitations, peripheral edema.  Respiratory: Negative for dyspnea at rest,  cough, sputum, wheezing.  GI: See history of present illness. MS: Negative for joint pain, low back pain.  Derm: Negative for rash or itching.  Neuro: Negative for memory loss, confusion.  Endo: Negative for unusual weight change.  Heme: Negative for bruising or bleeding. Allergy: Negative for rash or hives.   Physical Exam: BP 130/80   Pulse 75  Temp (!) 97.5 F (36.4 C) (Temporal)   Ht '5\' 8"'  (1.727 m)   Wt 166 lb 12.8 oz (75.7 kg)   BMI 25.36 kg/m  General:   Alert and oriented. Pleasant and cooperative. Well-nourished and well-developed.  Eyes:  Without icterus, sclera clear and conjunctiva pink.  Ears:  Normal auditory acuity. Cardiovascular:  S1, S2 present without murmurs appreciated. Extremities without clubbing or edema. Respiratory:  Clear to auscultation bilaterally. No wheezes, rales, or rhonchi. No distress.  Gastrointestinal:  +BS, soft, non-tender and non-distended. No HSM noted. No guarding or rebound. No masses appreciated. No LE edema. No asterixis. Rectal:  Deferred  Musculoskalatal:  Symmetrical without gross deformities. Skin:  Intact without significant lesions or rashes. Neurologic:  Alert and oriented x4;  grossly normal neurologically. Psych:  Alert and cooperative. Normal mood and affect. Heme/Lymph/Immune: No excessive bruising noted.    09/01/2019 9:38 AM   Disclaimer: This note was dictated with voice recognition software. Similar sounding words can inadvertently be transcribed and may not be corrected upon review.

## 2019-09-01 NOTE — Progress Notes (Signed)
Cc'ed to pcp °

## 2019-09-01 NOTE — Assessment & Plan Note (Addendum)
History of cirrhosis with last meld score at 14.  At that time, however, his bilirubin was mildly elevated and is since normalized.  He has been seen by [facility] transplant program but requires a 17-month alcohol counseling program to be eligible for transplant evaluation.  Lower extremity edema has improved and essentially resolved on diuretics.  On rifaximin for hepatic encephalopathy prophylaxis seems to be working well, has about 3 bowel movements today.  Denies any overt hepatic or GI symptoms other than recurrence of his left lower quadrant/groin abdominal pain previously evaluated by surgery with limited surgical options.  Recent EGD with grade 2 esophageal varices currently on nadolol.  Recommend continue nonselective beta-blocker.  At this time we will update his imaging and labs, continue his current medications and low-salt diet.  We will try to help him find a counseling program transplant eligibility.

## 2019-09-01 NOTE — Assessment & Plan Note (Signed)
Remain sober.  Currently 3-1/2 years without alcohol.  We are trying to find an outpatient alcohol counseling program for him based on requirements from [facility] liver transplant center to qualify for transplant evaluation.  He is limited by transportation issues.  We will try to help find him a program.  There may be options for virtual/telehealth given the current COVID-19/coronavirus pandemic.

## 2019-09-01 NOTE — Patient Instructions (Signed)
Your health issues we discussed today were:   Cirrhosis: 1. I am glad you are doing well 2. Continue your current medications 3. Continue low-salt diet 4. Have your labs completed when you are able to 5. We will help schedule your ultrasound for you 6. Call us if you have any worsening or significant problems  History of alcohol abuse: 1. I am thrilled that you have been without alcohol for 3-1/2 years 2. I will try to help find a 79-month outpatient counseling program that will allow you to be eligible for transplant evaluation at [facility]  Overall I recommend:  1. Continue your other current medications 2. Return for follow-up in 6 months 3. Call for any questions or concerns.   ---------------------------------------------------------------  COVID-19 Vaccine Information can be found at: ShippingScam.co.uk For questions related to vaccine distribution or appointments, please email [email or web address] or call [phone].   ---------------------------------------------------------------   At [facility] we value your feedback. You may receive a survey about your visit today. Please share your experience as we strive to create trusting relationships with our patients to provide genuine, compassionate, quality care.  We appreciate your understanding and patience as we review any laboratory studies, imaging, and other diagnostic tests that are ordered as we care for you. Our office policy is 5 business days for review of these results, and any emergent or urgent results are addressed in a timely manner for your best interest. If you do not hear from our office in 1 week, please contact us.   We also encourage the use of MyChart, which contains your medical information for your review as well. If you are not enrolled in this feature, an access code is on this after visit summary for your convenience. Thank you for  allowing [place] to be involved in your care.  It was great to see you today!  I hope you have a great day!!

## 2019-09-01 NOTE — Assessment & Plan Note (Signed)
Improved/essentially resolved after EGD with dilation.

## 2019-09-02 ENCOUNTER — Other Ambulatory Visit: Payer: Self-pay

## 2019-09-03 ENCOUNTER — Ambulatory Visit (INDEPENDENT_AMBULATORY_CARE_PROVIDER_SITE_OTHER): Payer: Medicare Other | Admitting: Family

## 2019-09-03 ENCOUNTER — Encounter: Payer: Self-pay | Admitting: Family

## 2019-09-03 VITALS — BP 126/82 | HR 70 | Temp 98.2°F | Ht 68.0 in | Wt 169.6 lb

## 2019-09-03 DIAGNOSIS — E1169 Type 2 diabetes mellitus with other specified complication: Secondary | ICD-10-CM

## 2019-09-03 DIAGNOSIS — K7460 Unspecified cirrhosis of liver: Secondary | ICD-10-CM | POA: Diagnosis not present

## 2019-09-03 LAB — BAYER DCA HB A1C WAIVED: HB A1C (BAYER DCA - WAIVED): 9.4 % — ABNORMAL HIGH (ref <7.0)

## 2019-09-03 MED ORDER — METFORMIN HCL ER 750 MG PO TB24: 1500.0000 mg | ORAL_TABLET | Freq: Every day | ORAL | 1 refills | Status: DC

## 2019-09-03 MED ORDER — FLUTICASONE PROPIONATE 50 MCG/ACT NA SUSP: 2.0000 | Freq: Every day | NASAL | 6 refills | Status: DC

## 2019-09-03 MED ORDER — BD PEN NEEDLE MINI U/F 31G X 5 MM MISC: 3 refills | Status: DC

## 2019-09-03 MED ORDER — ONETOUCH ULTRA VI STRP: 1.0000 | ORAL_STRIP | Freq: Four times a day (QID) | 0 refills | Status: DC

## 2019-09-03 NOTE — Patient Instructions (Signed)

## 2019-09-03 NOTE — Progress Notes (Signed)
Subjective:    Patient ID: Thomas Pratt, male    DOB: January 21, 1961, 59 y.o.   MRN: [id number]  Chief Complaint  Patient presents with  . Diabetes   PT presents to the office today to recheck DM. He had a nutrition and diabetic educator and doing well.   He saw GI 09/01/19 for cirrhosis of liver. He has been sober for 3 1/2 years and needs to get counseling program to get on transplant list. PT is stable at this time.  Diabetes He presents for his follow-up diabetic visit. He has type 2 diabetes mellitus. His disease course has been stable. Pertinent negatives for diabetes include no blurred vision and no foot paresthesias. There are no hypoglycemic complications. Symptoms are stable. There are no diabetic complications. Pertinent negatives for diabetic complications include no CVA or heart disease. Risk factors for coronary artery disease include diabetes mellitus, dyslipidemia, male sex and hypertension. He is following a diabetic diet. His overall blood glucose range is 140-180 mg/dl. An ACE inhibitor/angiotensin II receptor blocker is being taken. Eye exam is current.      Review of Systems  Eyes: Negative for blurred vision.  All other systems reviewed and are negative.      Objective:   Physical Exam Vitals reviewed.  Constitutional:      General: He is not in acute distress.    Appearance: He is well-developed.  HENT:     Head: Normocephalic.     Right Ear: Tympanic membrane normal.     Left Ear: Tympanic membrane normal.  Eyes:     General:        Right eye: No discharge.        Left eye: No discharge.     Pupils: Pupils are equal, round, and reactive to light.  Neck:     Thyroid: No thyromegaly.  Cardiovascular:     Rate and Rhythm: Normal rate and regular rhythm.     Heart sounds: Normal heart sounds. No murmur.  Pulmonary:     Effort: Pulmonary effort is normal. No respiratory distress.     Breath sounds: Normal breath sounds. No wheezing.  Abdominal:    General: Bowel sounds are normal. There is no distension.     Palpations: Abdomen is soft.     Tenderness: There is no abdominal tenderness.  Musculoskeletal:        General: No tenderness. Normal range of motion.     Cervical back: Normal range of motion and neck supple.  Skin:    General: Skin is warm and dry.     Findings: No erythema or rash.  Neurological:     Mental Status: He is alert and oriented to person, place, and time.     Cranial Nerves: No cranial nerve deficit.     Deep Tendon Reflexes: Reflexes are normal and symmetric.  Psychiatric:        Behavior: Behavior normal.        Thought Content: Thought content normal.        Judgment: Judgment normal.       BP 126/82   Pulse 70   Temp 98.2 F (36.8 C) (Temporal)   Ht '5\' 8"'  (1.727 m)   Wt 169 lb 9.6 oz (76.9 kg)   SpO2 98%   BMI 25.79 kg/m      Assessment & Plan:  Thomas Pratt comes in today with chief complaint of Diabetes   Diagnosis and orders addressed:  1. Type 2 diabetes mellitus with other  specified complication, unspecified whether long term insulin use (HCC) Will increase metformin to 1500 mg from 750 mg Strict low carb diet RTO in 1 month to recheck  - Insulin Pen Needle (B-D UF III MINI PEN NEEDLES) 31G X 5 MM MISC; USE WITH TRESIBA daily Dx E11.69  Dispense: 100 each; Refill: 3 - metFORMIN (GLUCOPHAGE XR) 750 MG 24 hr tablet; Take 2 tablets (1,500 mg total) by mouth daily with breakfast.  Dispense: 90 tablet; Refill: 1 - BMP8+EGFR - Bayer DCA Hb A1c Waived  2. Cirrhosis of liver without ascites, unspecified hepatic cirrhosis type ([place]) Keep appt with GI specialists  - BMP8+EGFR    Evelina Dun, FNP

## 2019-09-04 LAB — BMP8+EGFR
BUN/Creatinine Ratio: 14 (ref 9–20)
BUN: 11 mg/dL (ref 6–24)
CO2: 25 mmol/L (ref 20–29)
Calcium: 10.2 mg/dL (ref 8.7–10.2)
Chloride: 98 mmol/L (ref 96–106)
Creatinine, Ser: 0.79 mg/dL (ref 0.76–1.27)
GFR calc Af Amer: 114 mL/min/{1.73_m2} (ref >59)
GFR calc non Af Amer: 98 mL/min/{1.73_m2} (ref >59)
Glucose: 95 mg/dL (ref 65–99)
Potassium: 5 mmol/L (ref 3.5–5.2)
Sodium: 136 mmol/L (ref 134–144)

## 2019-09-09 ENCOUNTER — Ambulatory Visit (HOSPITAL_COMMUNITY): Admission: RE | Admit: 2019-09-09 | Payer: Medicare Other | Source: Ambulatory Visit

## 2019-09-16 ENCOUNTER — Other Ambulatory Visit: Payer: Self-pay

## 2019-09-16 ENCOUNTER — Ambulatory Visit (HOSPITAL_COMMUNITY)
Admission: RE | Admit: 2019-09-16 | Discharge: 2019-09-16 | Disposition: A | Discharge status: Home / Self Care | Payer: Medicare Other | Source: Ambulatory Visit | Attending: Nurse Practitioner | Admitting: Nurse Practitioner

## 2019-09-16 ENCOUNTER — Other Ambulatory Visit (HOSPITAL_COMMUNITY)
Admission: RE | Admit: 2019-09-16 | Discharge: 2019-09-16 | Disposition: A | Discharge status: Home / Self Care | Payer: Medicare Other | Source: Ambulatory Visit | Attending: Nurse Practitioner | Admitting: Nurse Practitioner

## 2019-09-16 DIAGNOSIS — K7460 Unspecified cirrhosis of liver: Secondary | ICD-10-CM | POA: Insufficient documentation

## 2019-09-16 DIAGNOSIS — R1310 Dysphagia, unspecified: Secondary | ICD-10-CM | POA: Insufficient documentation

## 2019-09-16 DIAGNOSIS — R1319 Other dysphagia: Secondary | ICD-10-CM

## 2019-09-16 DIAGNOSIS — F1011 Alcohol abuse, in remission: Secondary | ICD-10-CM | POA: Diagnosis present

## 2019-09-16 DIAGNOSIS — K7689 Other specified diseases of liver: Secondary | ICD-10-CM | POA: Diagnosis not present

## 2019-09-16 LAB — COMPREHENSIVE METABOLIC PANEL
ALT: 46 U/L — ABNORMAL HIGH (ref 0–44)
AST: 35 U/L (ref 15–41)
Albumin: 4 g/dL (ref 3.5–5.0)
Alkaline Phosphatase: 64 U/L (ref 38–126)
Anion gap: 6 (ref 5–15)
BUN: 13 mg/dL (ref 6–20)
CO2: 30 mmol/L (ref 22–32)
Calcium: 9.7 mg/dL (ref 8.9–10.3)
Chloride: 101 mmol/L (ref 98–111)
Creatinine, Ser: 0.67 mg/dL (ref 0.61–1.24)
GFR calc Af Amer: >60 mL/min (ref >60)
GFR calc non Af Amer: >60 mL/min (ref >60)
Glucose, Bld: 152 mg/dL — ABNORMAL HIGH (ref 70–99)
Potassium: 4.7 mmol/L (ref 3.5–5.1)
Sodium: 137 mmol/L (ref 135–145)
Total Bilirubin: 0.7 mg/dL (ref 0.3–1.2)
Total Protein: 7.5 g/dL (ref 6.5–8.1)

## 2019-09-16 LAB — CBC WITH DIFFERENTIAL/PLATELET
Abs Immature Granulocytes: 0.01 10*3/uL (ref 0.00–0.07)
Basophils Absolute: 0 10*3/uL (ref 0.0–0.1)
Basophils Relative: 0 %
Eosinophils Absolute: 0.1 10*3/uL (ref 0.0–0.5)
Eosinophils Relative: 1 %
HCT: 40.9 % (ref 39.0–52.0)
Hemoglobin: 13.5 g/dL (ref 13.0–17.0)
Immature Granulocytes: 0 %
Lymphocytes Relative: 29 %
Lymphs Abs: 1.6 10*3/uL (ref 0.7–4.0)
MCH: 32.4 pg (ref 26.0–34.0)
MCHC: 33 g/dL (ref 30.0–36.0)
MCV: 98.1 fL (ref 80.0–100.0)
Monocytes Absolute: 0.4 10*3/uL (ref 0.1–1.0)
Monocytes Relative: 7 %
Neutro Abs: 3.4 10*3/uL (ref 1.7–7.7)
Neutrophils Relative %: 63 %
Platelets: 155 10*3/uL (ref 150–400)
RBC: 4.17 MIL/uL — ABNORMAL LOW (ref 4.22–5.81)
RDW: 12.8 % (ref 11.5–15.5)
WBC: 5.5 10*3/uL (ref 4.0–10.5)
nRBC: 0 % (ref 0.0–0.2)

## 2019-09-16 LAB — PROTIME-INR
INR: 1.1 (ref 0.8–1.2)
Prothrombin Time: 13.9 seconds (ref 11.4–15.2)

## 2019-09-17 LAB — AFP TUMOR MARKER: AFP, Serum, Tumor Marker: 2.3 ng/mL (ref 0.0–8.3)

## 2019-09-21 ENCOUNTER — Telehealth: Payer: Self-pay | Admitting: Family

## 2019-09-21 NOTE — Telephone Encounter (Signed)
  Medication Request  09/21/2019  What is the name of the medication? glucose blood (ONETOUCH ULTRA) test strip  Insulin Pen Needle (B-D UF III MINI PEN NEEDLES) 31G X 5 MM MISC    Have you contacted your pharmacy to request a refill? Yes  Which pharmacy would you like this sent to? [facility]    Patient notified that their request is being sent to the clinical staff for review and that they should receive a call once it is complete. If they do not receive a call within 24 hours they can check with their pharmacy or our office.

## 2019-09-21 NOTE — Telephone Encounter (Signed)
Patient aware, scripts are ready.

## 2019-09-28 ENCOUNTER — Encounter: Payer: Medicare Other | Attending: Family | Admitting: Nutrition

## 2019-09-28 ENCOUNTER — Other Ambulatory Visit: Payer: Self-pay

## 2019-09-28 DIAGNOSIS — K7030 Alcoholic cirrhosis of liver without ascites: Secondary | ICD-10-CM | POA: Insufficient documentation

## 2019-09-28 DIAGNOSIS — E1169 Type 2 diabetes mellitus with other specified complication: Secondary | ICD-10-CM | POA: Diagnosis not present

## 2019-09-28 NOTE — Progress Notes (Signed)
  Medical Nutrition Therapy:  Appt start time: [id number] end time:  1045   Assessment:  Primary concerns today: Diabetes Type 2 New Diagnosed f/u Still taking 15 units of Tresiba and Metformin 750 mg/d. Changed cooking to baked and grilled. BS are better 170-290's. Making progress. Food insecurity. Taking the cap off his insulin now. BS are better. Trying to eat three meals per day.  CMP Latest Ref Rng & Units 09/16/2019 09/03/2019 06/24/2019  Glucose 70 - 99 mg/dL 152(H) 95 247(H)  BUN 6 - 20 mg/dL 13 11 10   Creatinine 0.61 - 1.24 mg/dL 0.67 0.79 0.95  Sodium 135 - 145 mmol/L 137 136 134  Potassium 3.5 - 5.1 mmol/L 4.7 5.0 4.6  Chloride 98 - 111 mmol/L 101 98 97  CO2 22 - 32 mmol/L 30 25 24   Calcium 8.9 - 10.3 mg/dL 9.7 10.2 10.0  Total Protein 6.5 - 8.1 g/dL 7.5 - 7.8  Total Bilirubin 0.3 - 1.2 mg/dL 0.7 - 0.8  Alkaline Phos 38 - 126 U/L 64 - 105  AST 15 - 41 U/L 35 - 44(H)  ALT 0 - 44 U/L 46(H) - 57(H)   Lab Results  Component Value Date   HGBA1C 9.4 (H) 09/03/2019   Preferred Learning Style:  Auditory  Visual  Hands on  Learning Readiness:   Ready  Change in progress   MEDICATIONS:    DIETARY INTAKE: .    24-hr recall:  B ( AM):2 eggs and 2 slice toast water or Snk ( AM):  L ( PM): vegetable salad, chicken,  Water, apple, Snk ( PM):  D ( PM): Meat and some vegetables, water,  Snk ( PM):  Beverages: water  Usual physical activity: yard work.  Estimated energy needs: 2000  calories 225 g carbohydrates 150 g protein 58 g fat  Progress Towards Goal(s):  In progress.   Nutritional Diagnosis:  NB-1.1 Food and nutrition-related knowledge deficit As related to Diabetes Type 2.  As evidenced by A1C > 10%.    Intervention:  Nutrition and Diabetes education provided on My Plate, CHO counting, meal planning, portion sizes, timing of meals, avoiding snacks between meals unless having a low blood sugar, target ranges for A1C and blood sugars, signs/symptoms and  treatment of hyper/hypoglycemia, monitoring blood sugars, taking medications as prescribed, benefits of exercising 30 minutes per day and prevention of complications of DM.    Goals Keep up the good work Follow My Plate Avoid snacks. Take insulin as prescribed Drink water Don't eat past 7 pm.    Teaching Method Utilized:  Visual Auditory Hands on  Handouts given during visit include:  The Plate Method  Meal Plan  Diabetes Instructions.   Barriers to learning/adherence to lifestyle change: none  Demonstrated degree of understanding via:  Teach Back   Monitoring/Evaluation:  Dietary intake, exercise, , and body weight in 2 month(s)

## 2019-10-05 ENCOUNTER — Ambulatory Visit: Payer: Medicare Other | Admitting: Family

## 2019-10-05 ENCOUNTER — Ambulatory Visit: Payer: Medicare Other | Admitting: Nutrition

## 2019-10-08 ENCOUNTER — Ambulatory Visit (INDEPENDENT_AMBULATORY_CARE_PROVIDER_SITE_OTHER): Payer: Medicare Other | Admitting: Family

## 2019-10-08 ENCOUNTER — Encounter: Payer: Self-pay | Admitting: Family

## 2019-10-08 ENCOUNTER — Other Ambulatory Visit: Payer: Self-pay

## 2019-10-08 VITALS — BP 105/63 | HR 73 | Temp 97.1°F | Ht 68.0 in | Wt 182.2 lb

## 2019-10-08 DIAGNOSIS — E1169 Type 2 diabetes mellitus with other specified complication: Secondary | ICD-10-CM | POA: Diagnosis not present

## 2019-10-08 NOTE — Patient Instructions (Signed)

## 2019-10-08 NOTE — Progress Notes (Signed)
Subjective:    Patient ID: Thomas Pratt, male    DOB: [birth year], 59 y.o.   MRN: [id number]  Chief Complaint  Patient presents with  . Diabetes   Pt presents to the office today to recheck DM.  Diabetes He presents for his follow-up diabetic visit. He has type 2 diabetes mellitus. There are no hypoglycemic associated symptoms. Pertinent negatives for diabetes include no blurred vision and no foot paresthesias. There are no hypoglycemic complications. Symptoms are stable. There are no diabetic complications. Pertinent negatives for diabetic complications include no CVA or heart disease. Risk factors for coronary artery disease include diabetes mellitus, hypertension and family history. He is following a generally healthy diet. His overall blood glucose range is 140-180 mg/dl. Eye exam is current.      Review of Systems  Eyes: Negative for blurred vision.  All other systems reviewed and are negative.      Objective:   Physical Exam Vitals reviewed.  Constitutional:      General: He is not in acute distress.    Appearance: He is well-developed.  HENT:     Head: Normocephalic.     Right Ear: Tympanic membrane normal.     Left Ear: Tympanic membrane normal.  Eyes:     General:        Right eye: No discharge.        Left eye: No discharge.     Pupils: Pupils are equal, round, and reactive to light.  Neck:     Thyroid: No thyromegaly.  Cardiovascular:     Rate and Rhythm: Normal rate and regular rhythm.     Heart sounds: Normal heart sounds. No murmur.  Pulmonary:     Effort: Pulmonary effort is normal. No respiratory distress.     Breath sounds: Normal breath sounds. No wheezing.  Abdominal:     General: Bowel sounds are normal. There is no distension.     Palpations: Abdomen is soft.     Tenderness: There is no abdominal tenderness.  Musculoskeletal:        General: No tenderness. Normal range of motion.     Cervical back: Normal range of motion and neck supple.    Skin:    General: Skin is warm and dry.     Findings: No erythema or rash.  Neurological:     Mental Status: He is alert and oriented to person, place, and time.     Cranial Nerves: No cranial nerve deficit.     Deep Tendon Reflexes: Reflexes are normal and symmetric.  Psychiatric:        Behavior: Behavior normal.        Thought Content: Thought content normal.        Judgment: Judgment normal.     BP 105/63   Pulse 73   Temp (!) 97.1 F (36.2 C) (Temporal)   Ht 5\' 8"  (1.727 m)   Wt 182 lb 3.2 oz (82.6 kg)   SpO2 96%   BMI 27.70 kg/m        Assessment & Plan:  Daine Stoia comes in today with chief complaint of Diabetes   Diagnosis and orders addressed:  1. Type 2 diabetes mellitus with other specified complication, unspecified whether long term insulin use (HCC) Continue current medications  Low carb diet  Labs reviewed from 09/16/19, will hold off on any new labs today Reviewed home log of blood glucose Health Maintenance reviewed- Encouraged to get diabetic eye exam Diet and exercise encouraged  Follow  up plan: 2 months    Evelina Dun, FNP

## 2019-10-13 ENCOUNTER — Encounter: Payer: Self-pay | Admitting: Nutrition

## 2019-10-13 NOTE — Patient Instructions (Signed)
Goals Keep up the good work Follow My Plate Avoid snacks. Take insulin as prescribed Drink water Don't eat past 7 pm.

## 2019-10-18 ENCOUNTER — Encounter: Payer: Medicare Other | Admitting: Family

## 2019-11-01 ENCOUNTER — Encounter: Payer: Medicare Other | Admitting: Family

## 2019-11-05 ENCOUNTER — Ambulatory Visit (INDEPENDENT_AMBULATORY_CARE_PROVIDER_SITE_OTHER): Payer: Medicare Other

## 2019-11-05 DIAGNOSIS — Z0000 Encounter for general adult medical examination without abnormal findings: Secondary | ICD-10-CM

## 2019-11-05 NOTE — Patient Instructions (Signed)
  [name] Maintenance Summary and Written Plan of Care  Thomas Pratt ,  Thank you for allowing me to perform your Medicare Annual Wellness Visit and for your ongoing commitment to your health.   Health Maintenance & Immunization History Health Maintenance  Topic Date Due  . INFLUENZA VACCINE  02/20/2020  . HEMOGLOBIN A1C  03/02/2020  . FOOT EXAM  05/19/2020  . URINE MICROALBUMIN  05/19/2020  . OPHTHALMOLOGY EXAM  08/11/2020  . COLONOSCOPY  03/08/2027  . TETANUS/TDAP  09/27/2027  . PNEUMOCOCCAL POLYSACCHARIDE VACCINE AGE 58-64 HIGH RISK  Completed  . Hepatitis C Screening  Completed  . HIV Screening  Completed   Immunization History  Administered Date(s) Administered  . Influenza,inj,Quad PF,6+ Mos 04/03/2018, 05/20/2019  . Pneumococcal Conjugate-13 09/26/2017  . Pneumococcal Polysaccharide-23 03/03/2018  . Tdap 09/26/2017    These are the patient goals that we discussed: Goals Addressed            This Visit's Progress   . Client will verbalize knowledge of diabetes self-management as evidenced by Hgb A1C <7 or as defined by provider.       Diabetes self management actions:  Glucose monitoring per provider recommendations  Perform Quality checks on blood meter  Eat Healthy  Check feet daily  Visit provider every 3-6 months as directed  Hbg A1C level every 3-6 months.  Eye Exam yearly    . DIET - INCREASE WATER INTAKE          This is a list of Health Maintenance Items that are overdue or due now: There are no preventive care reminders to display for this patient.   Orders/Referrals Placed Today: No orders of the defined types were placed in this encounter.  (Contact our referral department at [phone] if you have not spoken with someone about your referral appointment within the next 5 days)    Follow-up Plan  With Evelina Dun, FNP on 01/10/2020

## 2019-11-05 NOTE — Progress Notes (Signed)
MEDICARE ANNUAL WELLNESS VISIT  11/05/2019  Telephone Visit Disclaimer This Medicare AWV was conducted by telephone due to national recommendations for restrictions regarding the COVID-19 Pandemic (e.g. social distancing).  I verified, using two identifiers, that I am speaking with Thomas Pratt or their authorized healthcare agent. I discussed the limitations, risks, security, and privacy concerns of performing an evaluation and management service by telephone and the potential availability of an in-person appointment in the future. The patient expressed understanding and agreed to proceed.   Subjective:  Thomas Pratt is a 59 y.o. male patient of Hawks, Thomas Hawthorne, FNP who had a Medicare Annual Wellness Visit today via telephone. Thomas Pratt is Disabled and lives with an adult companion. he has four children. he reports that he is socially active and does interact with friends/family regularly. he is minimally physically active and enjoys gardening.  Patient Care Team: Thomas Balloon, FNP as PCP - General (Family Medicine) Thomas Dolin, MD as Consulting Physician (Gastroenterology)  Advanced Directives 11/05/2019 05/03/2019 11/08/2018 08/05/2018 04/17/2018 12/19/2017 10/14/2017  Does Patient Have a Medical Advance Directive? No No No No No No No  Would patient like information on creating a medical advance directive? No - Patient declined No - Patient declined No - Patient declined No - Patient declined - - No - Patient declined    Hospital Utilization Over the Past 12 Months: # of hospitalizations or ER visits: 0 # of surgeries: 0  Review of Systems    Patient reports that his overall health is unchanged compared to last year.  Patient Reported Readings (Blood glucose 129  Pain Assessment Pain : 0-10 Pain Score: 7  Pain Type: Chronic pain Pain Location: Foot Pain Orientation: Lateral Pain Descriptors / Indicators: Constant Pain Onset: More than a month ago Pain  Frequency: Constant Effect of Pain on Daily Activities: Still continues to walk and perform ADL's     Current Medications & Allergies (verified) Allergies as of 11/05/2019   No Known Allergies     Medication List       Accurate as of November 05, 2019  1:46 PM. If you have any questions, ask your nurse or doctor.        atorvastatin 10 MG tablet Commonly known as: LIPITOR TAKE 1 TABLET BY MOUTH  DAILY What changed:   how much to take  how to take this  when to take this   B-D UF III MINI PEN NEEDLES 31G X 5 MM Misc Generic drug: Insulin Pen Needle USE WITH TRESIBA daily Dx E11.69   blood glucose meter kit and supplies Dispense based on patient and insurance preference. Use up to four times daily as directed. (FOR ICD-10 E10.9, E11.9).   cetirizine 10 MG tablet Commonly known as: ZYRTEC Take 1 tablet (10 mg total) by mouth daily.   Dexilant 60 MG capsule Generic drug: dexlansoprazole TAKE 1 CAPSULE BY MOUTH  DAILY   escitalopram 10 MG tablet Commonly known as: Lexapro Take 1 tablet (10 mg total) by mouth daily.   fluticasone 50 MCG/ACT nasal spray Commonly known as: FLONASE Place 2 sprays into both nostrils daily.   metFORMIN 750 MG 24 hr tablet Commonly known as: Glucophage XR Take 2 tablets (1,500 mg total) by mouth daily with breakfast.   OneTouch Delica Plus [id number] Misc 1 each by Other route 4 (four) times daily. Dx E11.69   OneTouch Ultra test strip Generic drug: glucose blood 1 each by Other route in the morning, at noon, in  the evening, and at bedtime. Use as instructed   spironolactone 50 MG tablet Commonly known as: ALDACTONE Take 1 tablet (50 mg total) by mouth daily.   Tyler Aas FlexTouch 100 UNIT/ML FlexTouch Pen Generic drug: insulin degludec Inject 0.15 mLs (15 Units total) into the skin daily.   VITAMIN D PO Take by mouth daily. Unsure of strength       History (reviewed): Past Medical History:  Diagnosis Date  . Cirrhosis  ([place])    likely ETOH use   . Diabetes [facility])    Past Surgical History:  Procedure Laterality Date  . CHOLECYSTECTOMY N/A 09/01/2017   Procedure: LAPAROSCOPIC CHOLECYSTECTOMY;  Surgeon: Thomas Signs, MD;  Location: [facility];  Service: General;  Laterality: N/A;  . COLONOSCOPY WITH PROPOFOL N/A 03/06/2017   Dr. Gala Pratt: tubular adenoma removed, next TCS 5 years if health permits.   . ESOPHAGOGASTRODUODENOSCOPY (EGD) WITH PROPOFOL N/A 03/06/2017   Dr. Gala Pratt: grade 1 esophageal varices, erosive esophagitis, portal gastropathy.   . ESOPHAGOGASTRODUODENOSCOPY (EGD) WITH PROPOFOL N/A 05/03/2019   Procedure: ESOPHAGOGASTRODUODENOSCOPY (EGD) WITH PROPOFOL;  Surgeon: Thomas Dolin, MD;  Location: [facility];  Service: Endoscopy;  Laterality: N/A;  10:30am  . HERNIA REPAIR Right    RIH  . INGUINAL HERNIA REPAIR Right 07/11/2017   Procedure: HERNIA REPAIR INGUINAL ADULT WITH MESH;  Surgeon: Thomas Signs, MD;  Location: [facility];  Service: General;  Laterality: Right;  patient knows to arrive at 7:30  . IR TRANSCATHETER BX  10/14/2017  . IR US GUIDE VASC ACCESS RIGHT  10/14/2017  . IR VENOGRAM HEPATIC WO HEMODYNAMIC EVALUATION  10/14/2017  . MALONEY DILATION N/A 05/03/2019   Procedure: Thomas Pratt DILATION;  Surgeon: Thomas Dolin, MD;  Location: [facility];  Service: Endoscopy;  Laterality: N/A;  . POLYPECTOMY  03/06/2017   Procedure: POLYPECTOMY;  Surgeon: Thomas Dolin, MD;  Location: [facility];  Service: Endoscopy;;  ascending colon;   Family History  Problem Relation Age of Onset  . COPD Mother   . Liver disease Neg Hx   . Colon cancer Neg Hx   . Colon polyps Neg Hx    Social History   Socioeconomic History  . Marital status: Divorced    Spouse name: Not on file  . Number of children: Not on file  . Years of education: Not on file  . Highest education level: Not on file  Occupational History  . Occupation: unemployed    Comment: disability  Tobacco Use  . Smoking status: Never  Smoker  . Smokeless tobacco: Never Used  Substance and Sexual Activity  . Alcohol use: No    Comment: Quit September 2017, heavy alcohol abuse previously  . Drug use: No  . Sexual activity: Not Currently    Partners: Female  Other Topics Concern  . Not on file  Social History Narrative   Lives in [place], [place]. 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    Drank alcohol for years, quit 05/07/16. Has been sober since then. Quit b/c of health/cirrhosis.   Divorced for over 15 years.   Dating sporadically.   Has 3 daughters and 1 son live locally.   Originally from [place], moved here b/c of wife.    Eats all foods.    Wear seatbelt.   Does not drive, does not have a license, lost due to DWI.    Brought by neighbor.    Does not go to church. Believes in God. Prays each night, per  report.    Social Determinants of Health   Financial Resource Strain:   . Difficulty of Paying Living Expenses:   Food Insecurity:   . Worried About Charity fundraiser in the Last Year:   . Arboriculturist in the Last Year:   Transportation Needs:   . Film/video editor (Medical):   Marland Kitchen Lack of Transportation (Non-Medical):   Physical Activity:   . Days of Exercise per Week:   . Minutes of Exercise per Session:   Stress:   . Feeling of Stress :   Social Connections:   . Frequency of Communication with Friends and Family:   . Frequency of Social Gatherings with Friends and Family:   . Attends Religious Services:   . Active Member of Clubs or Organizations:   . Attends Archivist Meetings:   Marland Kitchen Marital Status:     Activities of Daily Living In your present state of health, do you have any difficulty performing the following activities: 11/05/2019 11/08/2018  Hearing? N N  Vision? N N  Difficulty concentrating or making decisions? Y N  Walking or climbing stairs? N Y  Dressing or bathing? N Y  Doing errands, shopping? N N  Preparing Food and eating ? N -  Using the Toilet? N  -  In the past six months, have you accidently leaked urine? N -  Do you have problems with loss of bowel control? N -  Managing your Medications? N -  Managing your Finances? N -  Housekeeping or managing your Housekeeping? N -  Some recent data might be hidden    Patient Education/ Literacy How often do you need to have someone help you when you read instructions, pamphlets, or other written materials from your doctor or pharmacy?: 2 - Rarely What is the last grade level you completed in school?: 11th grade  Exercise Current Exercise Habits: Home exercise routine, Type of exercise: walking, Time (Minutes): 30, Frequency (Times/Week): 5, Weekly Exercise (Minutes/Week): 150, Intensity: Mild  Diet Patient reports consuming 2 meals a day and 2 snack(s) a day Patient reports that his primary diet is: Diabetic Patient reports that she does have regular access to food.   Depression Screen PHQ 2/9 Scores 11/05/2019 10/08/2019 09/03/2019 07/30/2019 07/12/2019 06/24/2019 05/20/2019  PHQ - 2 Score 0 0 0 0 0 0 3  PHQ- 9 Score - - 2 - - - 5     Fall Risk Fall Risk  11/05/2019 10/08/2019 09/03/2019 07/30/2019 07/12/2019  Falls in the past year? 0 0 0 0 -  Number falls in past yr: - - - - 0  Injury with Fall? - - - - 0     Objective:  Thomas Pratt seemed alert and oriented and he participated appropriately during our telephone visit.  Blood Pressure Weight BMI  BP Readings from Last 3 Encounters:  10/08/19 105/63  09/03/19 126/82  09/01/19 130/80   Wt Readings from Last 3 Encounters:  10/08/19 182 lb 3.2 oz (82.6 kg)  09/03/19 169 lb 9.6 oz (76.9 kg)  09/01/19 166 lb 12.8 oz (75.7 kg)   BMI Readings from Last 1 Encounters:  10/08/19 27.70 kg/m    *Unable to obtain current vital Pratt, weight, and BMI due to telephone visit type  Hearing/Vision  . Shelden did not seem to have difficulty with hearing/understanding during the telephone conversation . Reports that he has had a formal  eye exam by an eye care professional within the past year .  Reports that he has not had a formal hearing evaluation within the past year *Unable to fully assess hearing and vision during telephone visit type  Cognitive Function: 6CIT Screen 11/05/2019  What Year? 0 points  What month? 0 points  What time? 0 points  Count back from 20 0 points  Months in reverse 2 points  Repeat phrase 10 points  Total Score 12   (Normal:0-7, Significant for Dysfunction: >8)  Normal Cognitive Function Screening: No: 12   Immunization & Health Maintenance Record Immunization History  Administered Date(s) Administered  . Influenza,inj,Quad PF,6+ Mos 04/03/2018, 05/20/2019  . Pneumococcal Conjugate-13 09/26/2017  . Pneumococcal Polysaccharide-23 03/03/2018  . Tdap 09/26/2017    Health Maintenance  Topic Date Due  . INFLUENZA VACCINE  02/20/2020  . HEMOGLOBIN A1C  03/02/2020  . FOOT EXAM  05/19/2020  . URINE MICROALBUMIN  05/19/2020  . OPHTHALMOLOGY EXAM  08/11/2020  . COLONOSCOPY  03/08/2027  . TETANUS/TDAP  09/27/2027  . PNEUMOCOCCAL POLYSACCHARIDE VACCINE AGE 1-64 HIGH RISK  Completed  . Hepatitis C Screening  Completed  . HIV Screening  Completed       Assessment  This is a routine wellness examination for Thomas Pratt.  Health Maintenance: Due or Overdue There are no preventive care reminders to display for this patient.  Thomas Pratt does not need a referral for Community Assistance: Care Management:   no Social Work:    no Prescription Assistance:  no Nutrition/Diabetes Education:  no   Plan:  Personalized Goals Goals Addressed            This Visit's Progress   . Client will verbalize knowledge of diabetes self-management as evidenced by Hgb A1C <7 or as defined by provider.       Diabetes self management actions:  Glucose monitoring per provider recommendations  Perform Quality checks on blood meter  Eat Healthy  Check feet daily  Visit provider  every 3-6 months as directed  Hbg A1C level every 3-6 months.  Eye Exam yearly    . DIET - INCREASE WATER INTAKE        Personalized Health Maintenance & Screening Recommendations    Lung Cancer Screening Recommended: no (Low Dose CT Chest recommended if Age 83-80 years, 30 pack-year currently smoking OR have quit w/in past 15 years) Hepatitis C Screening recommended: no HIV Screening recommended: no  Advanced Directives: Written information was not prepared per patient's request.  Referrals & Orders No orders of the defined types were placed in this encounter.   Follow-up Plan . Follow-up with Thomas Balloon, FNP as planned . Schedule 01/10/2020    I have personally reviewed and noted the following in the patient's chart:   . Medical and social history . Use of alcohol, tobacco or illicit drugs  . Current medications and supplements . Functional ability and status . Nutritional status . Physical activity . Advanced directives . List of other physicians . Hospitalizations, surgeries, and ER visits in previous 12 months . Vitals . Screenings to include cognitive, depression, and falls . Referrals and appointments  In addition, I have reviewed and discussed with Thomas Pratt certain preventive protocols, quality metrics, and best practice recommendations. A written personalized care plan for preventive services as well as general preventive health recommendations is available and can be mailed to the patient at his request.      Maud Deed [facility]  08/11/6242

## 2019-11-08 ENCOUNTER — Other Ambulatory Visit: Payer: Self-pay | Admitting: Family

## 2019-11-23 ENCOUNTER — Encounter: Payer: Medicare Other | Admitting: Family

## 2019-12-01 ENCOUNTER — Other Ambulatory Visit: Payer: Self-pay | Admitting: *Deleted

## 2019-12-01 MED ORDER — ONETOUCH ULTRA VI STRP: ORAL_STRIP | 3 refills | Status: DC

## 2019-12-13 ENCOUNTER — Telehealth: Payer: Self-pay | Admitting: *Deleted

## 2019-12-13 ENCOUNTER — Other Ambulatory Visit: Payer: Self-pay | Admitting: Family

## 2019-12-13 MED ORDER — ACCU-CHEK GUIDE VI STRP: ORAL_STRIP | 3 refills | Status: DC

## 2019-12-13 MED ORDER — ACCU-CHEK SOFTCLIX LANCETS MISC: 3 refills | Status: AC

## 2019-12-13 MED ORDER — ACCU-CHEK GUIDE W/DEVICE KIT: PACK | 0 refills | Status: DC

## 2019-12-13 MED ORDER — ACCU-CHEK SOFTCLIX LANCETS MISC: 3 refills | Status: DC

## 2019-12-13 NOTE — Telephone Encounter (Signed)
PA came in for One touch ultra test strips - We had sent over for Accu-Chek Guide test strips = I have faxed the pharm - WM to clarify.  Key: [id number] ( JUST IN CASE WE NEED)

## 2019-12-13 NOTE — Telephone Encounter (Signed)
Pt aware scripts sent to pharmacy

## 2019-12-13 NOTE — Addendum Note (Signed)
Addended by: Antonietta Barcelona D on: 12/13/2019 02:40 PM   Modules accepted: Orders

## 2019-12-13 NOTE — Telephone Encounter (Signed)
  Prescription Request  12/13/2019  What is the name of the medication or equipment? Needs ACCU CHEK machine, lancet and strips called  Have you contacted your pharmacy to request a refill? (if applicable) yes  Which pharmacy would you like this sent to? [facility]   Patient notified that their request is being sent to the clinical staff for review and that they should receive a response within 2 business days.

## 2019-12-15 ENCOUNTER — Other Ambulatory Visit: Payer: Self-pay | Admitting: *Deleted

## 2019-12-15 DIAGNOSIS — I10 Essential (primary) hypertension: Secondary | ICD-10-CM

## 2019-12-15 DIAGNOSIS — F320 Major depressive disorder, single episode, mild: Secondary | ICD-10-CM

## 2019-12-15 MED ORDER — ESCITALOPRAM OXALATE 10 MG PO TABS: 10.0000 mg | ORAL_TABLET | Freq: Every day | ORAL | 0 refills | Status: DC

## 2019-12-15 MED ORDER — SPIRONOLACTONE 50 MG PO TABS: 50.0000 mg | ORAL_TABLET | Freq: Every day | ORAL | 0 refills | Status: DC

## 2019-12-21 ENCOUNTER — Telehealth: Payer: Self-pay | Admitting: Family

## 2019-12-21 NOTE — Telephone Encounter (Signed)
Pt called stating that he has been checking his blood sugar and the last time he checked it, it was 279, but has been in the 300s. Has an appt to see Christy on 01/10/20 but says he can come sooner if he needs to. Wants advice on what to do.

## 2019-12-22 ENCOUNTER — Encounter (HOSPITAL_COMMUNITY): Payer: Self-pay

## 2019-12-22 ENCOUNTER — Emergency Department (HOSPITAL_COMMUNITY): Payer: Medicare Other

## 2019-12-22 ENCOUNTER — Other Ambulatory Visit: Payer: Self-pay

## 2019-12-22 ENCOUNTER — Observation Stay (HOSPITAL_COMMUNITY)
Admission: EM | Admit: 2019-12-22 | Discharge: 2019-12-23 | Disposition: A | Discharge status: Home / Self Care | Payer: Medicare Other | Attending: Family Medicine | Admitting: Family Medicine

## 2019-12-22 DIAGNOSIS — Z79899 Other long term (current) drug therapy: Secondary | ICD-10-CM | POA: Diagnosis not present

## 2019-12-22 DIAGNOSIS — F329 Major depressive disorder, single episode, unspecified: Secondary | ICD-10-CM | POA: Insufficient documentation

## 2019-12-22 DIAGNOSIS — E1169 Type 2 diabetes mellitus with other specified complication: Secondary | ICD-10-CM | POA: Diagnosis not present

## 2019-12-22 DIAGNOSIS — R1084 Generalized abdominal pain: Secondary | ICD-10-CM | POA: Diagnosis not present

## 2019-12-22 DIAGNOSIS — E119 Type 2 diabetes mellitus without complications: Secondary | ICD-10-CM | POA: Diagnosis not present

## 2019-12-22 DIAGNOSIS — Z20822 Contact with and (suspected) exposure to covid-19: Secondary | ICD-10-CM | POA: Insufficient documentation

## 2019-12-22 DIAGNOSIS — Z8673 Personal history of transient ischemic attack (TIA), and cerebral infarction without residual deficits: Secondary | ICD-10-CM | POA: Diagnosis not present

## 2019-12-22 DIAGNOSIS — K922 Gastrointestinal hemorrhage, unspecified: Secondary | ICD-10-CM | POA: Diagnosis not present

## 2019-12-22 DIAGNOSIS — K3189 Other diseases of stomach and duodenum: Secondary | ICD-10-CM | POA: Diagnosis not present

## 2019-12-22 DIAGNOSIS — I8510 Secondary esophageal varices without bleeding: Secondary | ICD-10-CM | POA: Diagnosis not present

## 2019-12-22 DIAGNOSIS — Z03818 Encounter for observation for suspected exposure to other biological agents ruled out: Secondary | ICD-10-CM | POA: Diagnosis not present

## 2019-12-22 DIAGNOSIS — K766 Portal hypertension: Secondary | ICD-10-CM | POA: Insufficient documentation

## 2019-12-22 DIAGNOSIS — I10 Essential (primary) hypertension: Secondary | ICD-10-CM | POA: Diagnosis present

## 2019-12-22 DIAGNOSIS — K7030 Alcoholic cirrhosis of liver without ascites: Secondary | ICD-10-CM | POA: Diagnosis not present

## 2019-12-22 DIAGNOSIS — K921 Melena: Secondary | ICD-10-CM | POA: Diagnosis present

## 2019-12-22 DIAGNOSIS — Z794 Long term (current) use of insulin: Secondary | ICD-10-CM | POA: Insufficient documentation

## 2019-12-22 DIAGNOSIS — K219 Gastro-esophageal reflux disease without esophagitis: Secondary | ICD-10-CM | POA: Insufficient documentation

## 2019-12-22 HISTORY — DX: Heatstroke and sunstroke, initial encounter: T67.01XA

## 2019-12-22 LAB — PROTIME-INR
INR: 1.1 (ref 0.8–1.2)
Prothrombin Time: 13.5 seconds (ref 11.4–15.2)

## 2019-12-22 LAB — CBC
HCT: 37.2 % — ABNORMAL LOW (ref 39.0–52.0)
HCT: 37.6 % — ABNORMAL LOW (ref 39.0–52.0)
HCT: 39.2 % (ref 39.0–52.0)
Hemoglobin: 12.4 g/dL — ABNORMAL LOW (ref 13.0–17.0)
Hemoglobin: 12.7 g/dL — ABNORMAL LOW (ref 13.0–17.0)
Hemoglobin: 13.1 g/dL (ref 13.0–17.0)
MCH: 31.6 pg (ref 26.0–34.0)
MCH: 32.3 pg (ref 26.0–34.0)
MCH: 32.5 pg (ref 26.0–34.0)
MCHC: 33 g/dL (ref 30.0–36.0)
MCHC: 33.4 g/dL (ref 30.0–36.0)
MCHC: 34.1 g/dL (ref 30.0–36.0)
MCV: 95.1 fL (ref 80.0–100.0)
MCV: 95.7 fL (ref 80.0–100.0)
MCV: 96.8 fL (ref 80.0–100.0)
Platelets: 155 10*3/uL (ref 150–400)
Platelets: 158 10*3/uL (ref 150–400)
Platelets: 164 10*3/uL (ref 150–400)
RBC: 3.91 MIL/uL — ABNORMAL LOW (ref 4.22–5.81)
RBC: 3.93 MIL/uL — ABNORMAL LOW (ref 4.22–5.81)
RBC: 4.05 MIL/uL — ABNORMAL LOW (ref 4.22–5.81)
RDW: 12.5 % (ref 11.5–15.5)
RDW: 12.8 % (ref 11.5–15.5)
RDW: 12.8 % (ref 11.5–15.5)
WBC: 4.3 10*3/uL (ref 4.0–10.5)
WBC: 4.8 10*3/uL (ref 4.0–10.5)
WBC: 5.4 10*3/uL (ref 4.0–10.5)
nRBC: 0 % (ref 0.0–0.2)
nRBC: 0 % (ref 0.0–0.2)
nRBC: 0 % (ref 0.0–0.2)

## 2019-12-22 LAB — COMPREHENSIVE METABOLIC PANEL
ALT: 28 U/L (ref 0–44)
AST: 26 U/L (ref 15–41)
Albumin: 3.9 g/dL (ref 3.5–5.0)
Alkaline Phosphatase: 56 U/L (ref 38–126)
Anion gap: 8 (ref 5–15)
BUN: 14 mg/dL (ref 6–20)
CO2: 25 mmol/L (ref 22–32)
Calcium: 9.4 mg/dL (ref 8.9–10.3)
Chloride: 103 mmol/L (ref 98–111)
Creatinine, Ser: 0.78 mg/dL (ref 0.61–1.24)
GFR calc Af Amer: >60 mL/min (ref >60)
GFR calc non Af Amer: >60 mL/min (ref >60)
Glucose, Bld: 233 mg/dL — ABNORMAL HIGH (ref 70–99)
Potassium: 4.2 mmol/L (ref 3.5–5.1)
Sodium: 136 mmol/L (ref 135–145)
Total Bilirubin: 0.8 mg/dL (ref 0.3–1.2)
Total Protein: 7.2 g/dL (ref 6.5–8.1)

## 2019-12-22 LAB — TYPE AND SCREEN
ABO/RH(D): A POS
Antibody Screen: NEGATIVE

## 2019-12-22 LAB — DIFFERENTIAL
Abs Immature Granulocytes: 0.01 10*3/uL (ref 0.00–0.07)
Basophils Absolute: 0 10*3/uL (ref 0.0–0.1)
Basophils Relative: 1 %
Eosinophils Absolute: 0.1 10*3/uL (ref 0.0–0.5)
Eosinophils Relative: 2 %
Immature Granulocytes: 0 %
Lymphocytes Relative: 33 %
Lymphs Abs: 1.4 10*3/uL (ref 0.7–4.0)
Monocytes Absolute: 0.4 10*3/uL (ref 0.1–1.0)
Monocytes Relative: 10 %
Neutro Abs: 2.4 10*3/uL (ref 1.7–7.7)
Neutrophils Relative %: 54 %

## 2019-12-22 LAB — SARS CORONAVIRUS 2 BY RT PCR (HOSPITAL ORDER, PERFORMED IN ~~LOC~~ HOSPITAL LAB): SARS Coronavirus 2: NEGATIVE

## 2019-12-22 LAB — POC OCCULT BLOOD, ED: Fecal Occult Bld: POSITIVE — AB

## 2019-12-22 MED ORDER — SODIUM CHLORIDE 0.9 % IV BOLUS
500.0000 mL | Freq: Once | INTRAVENOUS | Status: AC
  Administered 2019-12-22: 500 mL via INTRAVENOUS

## 2019-12-22 MED ORDER — ACETAMINOPHEN 325 MG PO TABS: 650.0000 mg | ORAL_TABLET | Freq: Four times a day (QID) | ORAL | Status: DC | PRN

## 2019-12-22 MED ORDER — SODIUM CHLORIDE 0.9 % IV SOLN
8.0000 mg/h | INTRAVENOUS | Status: DC
  Administered 2019-12-22 – 2019-12-23 (×2): 8 mg/h via INTRAVENOUS
  Filled 2019-12-22 (×5): qty 80

## 2019-12-22 MED ORDER — ONDANSETRON HCL 4 MG/2ML IJ SOLN: 4.0000 mg | Freq: Four times a day (QID) | INTRAMUSCULAR | Status: DC | PRN

## 2019-12-22 MED ORDER — FLUTICASONE PROPIONATE 50 MCG/ACT NA SUSP
2.0000 | Freq: Every day | NASAL | Status: DC
  Administered 2019-12-23: 2 via NASAL
  Filled 2019-12-22: qty 16

## 2019-12-22 MED ORDER — ONDANSETRON HCL 4 MG PO TABS: 4.0000 mg | ORAL_TABLET | Freq: Four times a day (QID) | ORAL | Status: DC | PRN

## 2019-12-22 MED ORDER — OCTREOTIDE LOAD VIA INFUSION
50.0000 ug | Freq: Once | INTRAVENOUS | Status: AC
  Administered 2019-12-22: 50 ug via INTRAVENOUS
  Filled 2019-12-22: qty 25

## 2019-12-22 MED ORDER — SODIUM CHLORIDE 0.9 % IV SOLN
1.0000 g | INTRAVENOUS | Status: DC
  Administered 2019-12-22 – 2019-12-23 (×2): 1 g via INTRAVENOUS
  Filled 2019-12-22 (×2): qty 10

## 2019-12-22 MED ORDER — IOHEXOL 300 MG/ML  SOLN
100.0000 mL | Freq: Once | INTRAMUSCULAR | Status: AC | PRN
  Administered 2019-12-22: 100 mL via INTRAVENOUS

## 2019-12-22 MED ORDER — SODIUM CHLORIDE 0.9 % IV SOLN
50.0000 ug/h | INTRAVENOUS | Status: DC
  Administered 2019-12-22 – 2019-12-23 (×2): 50 ug/h via INTRAVENOUS
  Filled 2019-12-22 (×5): qty 1

## 2019-12-22 MED ORDER — PANTOPRAZOLE SODIUM 40 MG IV SOLR
INTRAVENOUS | Status: AC
  Filled 2019-12-22: qty 160

## 2019-12-22 MED ORDER — SODIUM CHLORIDE 0.9 % IV SOLN: INTRAVENOUS | Status: AC

## 2019-12-22 MED ORDER — PANTOPRAZOLE SODIUM 40 MG IV SOLR: 40.0000 mg | Freq: Two times a day (BID) | INTRAVENOUS | Status: DC

## 2019-12-22 MED ORDER — SODIUM CHLORIDE 0.9 % IV SOLN: INTRAVENOUS | Status: DC

## 2019-12-22 MED ORDER — INSULIN ASPART 100 UNIT/ML ~~LOC~~ SOLN
0.0000 [IU] | SUBCUTANEOUS | Status: DC
  Administered 2019-12-23: 2 [IU] via SUBCUTANEOUS
  Administered 2019-12-23: 1 [IU] via SUBCUTANEOUS
  Administered 2019-12-23: 5 [IU] via SUBCUTANEOUS

## 2019-12-22 MED ORDER — SODIUM CHLORIDE 0.9 % IV SOLN
80.0000 mg | Freq: Once | INTRAVENOUS | Status: AC
  Administered 2019-12-22: 80 mg via INTRAVENOUS
  Filled 2019-12-22: qty 80

## 2019-12-22 MED ORDER — ACETAMINOPHEN 650 MG RE SUPP: 650.0000 mg | Freq: Four times a day (QID) | RECTAL | Status: DC | PRN

## 2019-12-22 NOTE — ED Notes (Signed)
Attempted report x1. 

## 2019-12-22 NOTE — ED Notes (Signed)
ED TO INPATIENT HANDOFF REPORT  ED Nurse Name and Phone #: [phone]  S Name/Age/Gender Thomas Pratt 59 y.o. male Room/Bed: APA08/APA08  Code Status   Code Status: Prior  Home/SNF/Other Home Patient oriented to: self, place, time and situation Is this baseline? Yes   Triage Complete: Triage complete  Chief Complaint GI bleed [K92.2]  Triage Note Pt reports episode last night and episode this morning of red blood in stool. No pain now, abdomen only hurts when he has to use the bathroom    Allergies No Known Allergies  Level of Care/Admitting Diagnosis ED Disposition    ED Disposition Condition [place]: [facility] [id number]  Level of Care: Stepdown [14]  Covid Evaluation: Asymptomatic Screening Protocol (No Symptoms)  Diagnosis: GI bleed [id number]  Admitting Physician: Bethena Roys [id number]  Attending Physician: Bethena Roys [id number]  Estimated length of stay: past midnight tomorrow  Certification:: I certify this patient will need inpatient services for at least 2 midnights       B Medical/Surgery History Past Medical History:  Diagnosis Date   Cirrhosis ([place])    likely ETOH use    Diabetes ([place])    Past Surgical History:  Procedure Laterality Date   CHOLECYSTECTOMY N/A 09/01/2017   Procedure: LAPAROSCOPIC CHOLECYSTECTOMY;  Surgeon: Aviva Signs, MD;  Location: [facility];  Service: General;  Laterality: N/A;   COLONOSCOPY WITH PROPOFOL N/A 03/06/2017   Dr. Gala Romney: tubular adenoma removed, next TCS 5 years if health permits.    ESOPHAGOGASTRODUODENOSCOPY (EGD) WITH PROPOFOL N/A 03/06/2017   Dr. Gala Romney: grade 1 esophageal varices, erosive esophagitis, portal gastropathy.    ESOPHAGOGASTRODUODENOSCOPY (EGD) WITH PROPOFOL N/A 05/03/2019   Procedure: ESOPHAGOGASTRODUODENOSCOPY (EGD) WITH PROPOFOL;  Surgeon: Daneil Dolin, MD;  Location: [facility];  Service: Endoscopy;  Laterality: N/A;  10:30am   HERNIA  REPAIR Right    RIH   INGUINAL HERNIA REPAIR Right 07/11/2017   Procedure: HERNIA REPAIR INGUINAL ADULT WITH MESH;  Surgeon: Aviva Signs, MD;  Location: [facility];  Service: General;  Laterality: Right;  patient knows to arrive at 7:30   IR TRANSCATHETER BX  10/14/2017   IR US GUIDE VASC ACCESS RIGHT  10/14/2017   IR VENOGRAM HEPATIC WO HEMODYNAMIC EVALUATION  10/14/2017   MALONEY DILATION N/A 05/03/2019   Procedure: Venia Minks DILATION;  Surgeon: Daneil Dolin, MD;  Location[facility];  Service: Endoscopy;  Laterality: N/A;   POLYPECTOMY  03/06/2017   Procedure: POLYPECTOMY;  Surgeon: Daneil Dolin, MD;  Location[facility];  Service: Endoscopy;;  ascending colon;     A IV Location/Drains/Wounds Patient Lines/Drains/Airways Status   Active Line/Drains/Airways    Name:   Placement date:   Placement time:   Site:   Days:   Peripheral IV 12/22/19 Right Forearm   12/22/19    0924    Forearm   less than 1   Peripheral IV 12/22/19 Left Hand   12/22/19    1827    Hand   less than 1          Intake/Output Last 24 hours  Intake/Output Summary (Last 24 hours) at 12/22/2019 2139 Last data filed at 12/22/2019 1945 Gross per 24 hour  Intake 702.46 ml  Output --  Net 702.46 ml    Labs/Imaging Results for orders placed or performed during the hospital encounter of 12/22/19 (from the past 48 hour(s))  Comprehensive metabolic panel     Status: Abnormal  Collection Time: 12/22/19  9:54 AM  Result Value Ref Range   Sodium 136 135 - 145 mmol/L   Potassium 4.2 3.5 - 5.1 mmol/L   Chloride 103 98 - 111 mmol/L   CO2 25 22 - 32 mmol/L   Glucose, Bld 233 (H) 70 - 99 mg/dL    Comment: Glucose reference range applies only to samples taken after fasting for at least 8 hours.   BUN 14 6 - 20 mg/dL   Creatinine, Ser 0.78 0.61 - 1.24 mg/dL   Calcium 9.4 8.9 - 10.3 mg/dL   Total Protein 7.2 6.5 - 8.1 g/dL   Albumin 3.9 3.5 - 5.0 g/dL   AST 26 15 - 41 U/L   ALT 28 0 - 44 U/L   Alkaline  Phosphatase 56 38 - 126 U/L   Total Bilirubin 0.8 0.3 - 1.2 mg/dL   GFR calc non Af Amer >60 >60 mL/min   GFR calc Af Amer >60 >60 mL/min   Anion gap 8 5 - 15    Comment: Performed at [facility], [address] [place], [place] [address]  CBC     Status: Abnormal   Collection Time: 12/22/19  9:54 AM  Result Value Ref Range   WBC 4.3 4.0 - 10.5 K/uL   RBC 3.93 (L) 4.22 - 5.81 MIL/uL   Hemoglobin 12.4 (L) 13.0 - 17.0 g/dL   HCT 37.6 (L) 39.0 - 52.0 %   MCV 95.7 80.0 - 100.0 fL   MCH 31.6 26.0 - 34.0 pg   MCHC 33.0 30.0 - 36.0 g/dL   RDW 12.5 11.5 - 15.5 %   Platelets 155 150 - 400 K/uL   nRBC 0.0 0.0 - 0.2 %    Comment: Performed at [facility], [address], [place], [place] [address]  Type and screen [facility]     Status: None   Collection Time: 12/22/19  9:54 AM  Result Value Ref Range   ABO/RH(D) A POS    Antibody Screen NEG    Sample Expiration      12/25/2019,2359 Performed at [facility], [address] [place], [place] [address]   Protime-INR     Status: None   Collection Time: 12/22/19  9:54 AM  Result Value Ref Range   Prothrombin Time 13.5 11.4 - 15.2 seconds   INR 1.1 0.8 - 1.2    Comment: (NOTE) INR goal varies based on device and disease states. Performed at [facility], [address] [place], [place] [address]   Differential     Status: None   Collection Time: 12/22/19  9:54 AM  Result Value Ref Range   Neutrophils Relative % 54 %   Neutro Abs 2.4 1.7 - 7.7 K/uL   Lymphocytes Relative 33 %   Lymphs Abs 1.4 0.7 - 4.0 K/uL   Monocytes Relative 10 %   Monocytes Absolute 0.4 0.1 - 1.0 K/uL   Eosinophils Relative 2 %   Eosinophils Absolute 0.1 0.0 - 0.5 K/uL   Basophils Relative 1 %   Basophils Absolute 0.0 0.0 - 0.1 K/uL   Immature Granulocytes 0 %   Abs Immature Granulocytes 0.01 0.00 - 0.07 K/uL    Comment: Performed at [facility], [address], [place],  [address]  SARS Coronavirus 2 by RT PCR (hospital order, performed in  [place] hospital lab) Nasopharyngeal Nasopharyngeal Swab     Status: None   Collection Time: 12/22/19  5:55 PM   Specimen: Nasopharyngeal Swab  Result Value Ref Range   SARS Coronavirus 2 NEGATIVE NEGATIVE    Comment: (NOTE) SARS-CoV-2 target nucleic acids are NOT DETECTED. The SARS-CoV-2 RNA is generally detectable in upper and lower respiratory specimens during the acute phase of infection. The lowest concentration of SARS-CoV-2 viral copies this assay can detect is 250 copies / mL. A negative result does not preclude SARS-CoV-2 infection and should not be used as the sole basis for treatment or other patient management decisions.  A negative result may occur with improper specimen collection / handling, submission of specimen other than nasopharyngeal swab, presence of viral mutation(s) within the areas targeted by this assay, and inadequate number of viral copies (<250 copies / mL). A negative result must be combined with clinical observations, patient history, and epidemiological information. Fact Sheet for Patients:   StrictlyIdeas.no Fact Sheet for Healthcare Providers: BankingDealers.co.za This test is not yet approved or cleared  by the [place] FDA and has been authorized for detection and/or diagnosis of SARS-CoV-2 by FDA under an Emergency Use Authorization (EUA).  This EUA will remain in effect (meaning this test can be used) for the duration of the COVID-19 declaration under Section 564(b)(1) of the Act, 21 U.S.C. section 360bbb-3(b)(1), unless the authorization is terminated or revoked sooner. Performed at [facility], [address], [place], [place] [address]   POC occult blood, ED     Status: Abnormal   Collection Time: 12/22/19  6:03 PM  Result Value Ref Range   Fecal Occult Bld POSITIVE (A) NEGATIVE  CBC     Status: Abnormal   Collection Time: 12/22/19  7:40 PM  Result Value Ref Range   WBC 5.4 4.0 - 10.5  K/uL   RBC 3.91 (L) 4.22 - 5.81 MIL/uL   Hemoglobin 12.7 (L) 13.0 - 17.0 g/dL   HCT 37.2 (L) 39.0 - 52.0 %   MCV 95.1 80.0 - 100.0 fL   MCH 32.5 26.0 - 34.0 pg   MCHC 34.1 30.0 - 36.0 g/dL   RDW 12.8 11.5 - 15.5 %   Platelets 164 150 - 400 K/uL   nRBC 0.0 0.0 - 0.2 %    Comment: Performed at [facility], [address] [place] [address], [place], [place] [address]   CT ABDOMEN PELVIS W CONTRAST  Result Date: 12/22/2019 CLINICAL DATA:  Acute generalized abdominal pain. EXAM: CT ABDOMEN AND PELVIS WITH CONTRAST TECHNIQUE: Multidetector CT imaging of the abdomen and pelvis was performed using the standard protocol following bolus administration of intravenous contrast. CONTRAST:  127mL OMNIPAQUE IOHEXOL 300 MG/ML  SOLN COMPARISON:  April 17, 2018. FINDINGS: Lower chest: Minimal right basilar subsegmental atelectasis is noted. Hepatobiliary: Status post cholecystectomy. No biliary dilatation is noted. No focal hepatic abnormality is noted. Possibly minimally nodular hepatic contours are noted suggesting possible hepatic cirrhosis. Pancreas: Unremarkable. No pancreatic ductal dilatation or surrounding inflammatory changes. Spleen: Normal in size without focal abnormality. Adrenals/Urinary Tract: Adrenal glands are unremarkable. Kidneys are normal, without renal calculi, focal lesion, or hydronephrosis. Bladder is unremarkable. Stomach/Bowel: Stomach is within normal limits. Appendix appears normal. No evidence of bowel wall thickening, distention, or inflammatory changes. Vascular/Lymphatic: Aortic atherosclerosis. No enlarged abdominal or pelvic lymph nodes. Reproductive: Prostate is unremarkable. Other: No abdominal wall hernia or abnormality. No abdominopelvic ascites. Musculoskeletal: No acute or significant osseous findings. IMPRESSION: 1. Possibly minimally nodular hepatic contours are noted suggesting hepatic cirrhosis. 2. No other abnormality seen in the abdomen or pelvis. Aortic Atherosclerosis (ICD10-I70.0).  Electronically Signed   By: Bobbe Medico.D.  On: 12/22/2019 11:18    Pending Labs Unresulted Labs (From admission, onward)    Start     Ordered   12/23/19 0500  Angiotensin converting enzyme  Tomorrow morning,   R     12/22/19 1828   Signed and Held  HIV Antibody (routine testing w rflx)  (HIV Antibody (Routine testing w reflex) panel)  Once,   R     Signed and Held   Signed and Held  Hemoglobin A1c  Tomorrow morning,   R    Comments: To assess prior glycemic control    Signed and Held   Signed and Held  CBC  Now then every 6 hours,   R     Signed and Held   Signed and Held  Basic metabolic panel  Tomorrow morning,   R     Signed and Held          Vitals/Pain Today's Vitals   12/22/19 1841 12/22/19 1900 12/22/19 1930 12/22/19 2000  BP: 104/64 107/71 (!) 109/55 113/68  Pulse: 72 68 74 70  Resp: 18 18 13 14   Temp: 98 F (36.7 C)     TempSrc: Oral     SpO2: 97% 94% 94% 94%  Weight:      Height:      PainSc:        Isolation Precautions No active isolations  Medications Medications  octreotide (SANDOSTATIN) 2 mcg/mL load via infusion 50 mcg (50 mcg Intravenous Bolus from Bag 12/22/19 1731)    And  octreotide (SANDOSTATIN) 500 mcg in sodium chloride 0.9 % 250 mL (2 mcg/mL) infusion (50 mcg/hr Intravenous New Bag/Given 12/22/19 1733)  pantoprazole (PROTONIX) 80 mg in sodium chloride 0.9 % 100 mL (0.8 mg/mL) infusion (8 mg/hr Intravenous New Bag/Given 12/22/19 1945)  pantoprazole (PROTONIX) injection 40 mg (has no administration in time range)  cefTRIAXone (ROCEPHIN) 1 g in sodium chloride 0.9 % 100 mL IVPB (0 g Intravenous Stopped 12/22/19 1921)  sodium chloride 0.9 % bolus 500 mL (0 mLs Intravenous Stopped 12/22/19 1020)  iohexol (OMNIPAQUE) 300 MG/ML solution 100 mL (100 mLs Intravenous Contrast Given 12/22/19 1031)  pantoprazole (PROTONIX) 80 mg in sodium chloride 0.9 % 100 mL IVPB (0 mg Intravenous Stopped 12/22/19 1945)    Mobility walks Low fall risk   Focused  Assessments    R Recommendations: See Admitting Provider Note  Report given to:   Additional Notes:

## 2019-12-22 NOTE — ED Provider Notes (Signed)
Care of the patient assumed at the change of shift, pending admission for GI bleeding. Octreotide ordered per GI recommendations. Spoke with Dr. Arlyce Dice who will evaluate for admission. Patient continues to have episodes of BRBPR.    Truddie Hidden, MD 12/22/19 [phone]

## 2019-12-22 NOTE — H&P (View-Only) (Signed)
Referring Provider: No ref. provider found Primary Care Physician:  Sharion Balloon, FNP Primary Gastroenterologist:  Dr. Gala Romney  Reason for Consultation:   Rectal bleeding in a patient with known cirrhosis and esophageal varices.  HPI:   Patient is 59 year old Caucasian male who has history of cirrhosis felt to be primarily due to prior alcohol use but he also has hepatic sarcoidosis(liver biopsy in feb,2019 at the time of laparoscopic cholecystectomy) who underwent esophagogastroduodenoscopy in October 2020 for dysphagia and was found to have grade 2 2 columns of esophageal varices as well as portal gastropathy.  He did undergo esophageal dilation without any complications. Patient had a normal bowel movement morning.  Last night around 10 PM he had an urge to have a bowel movement and is 10 passed bright red blood per rectum.  He had few more bowel movements during the night.  He experienced periumbilical abdominal pain.  He did not experience nausea vomiting hematemesis diaphoreses or lightheadedness. Patient reported to emergency room around 9:00 this morning.  He was evaluated by Dr. Roderic Palau. Lab studies are pertinent for H&H of 12.4 and 37.6 and platelet count of 150 5K.  INR was 1.1.  Bili was 0.8 with AST of 26 ALT of 28 and albumin of 3.9 and AP was 56.  His glucose was 233 with BUN of 14 and creatinine of 0.78.  He also underwent abdominal pelvic CT with contrast revealing evidence of cholecystectomy.  Subtle contour changes to liver prominent caudate lobe but no evidence of ascites splenomegaly or varices. Patient states he has had 4 bowel movements since he has been in emergency room and he is passing fresh blood.  He describes it to be small to moderate amount.  He says heartburn is well controlled with PPI.  He does not take aspirin or other OTC NSAIDs.  He has good appetite and has not lost any weight.  He has noted increase in blood glucose levels over the last few days.  He has not  experienced cough shortness of breath fever or chills. He complains of pain in right inguinal region which she has had ever since he had hernia repair in December 2018.  He underwent surveillance colonoscopy in August 2018 with removal of small polyp from ascending colon and was a tubular adenoma.  Patient is divorced.  He works with a company as a Curator.  He is not disabled.  He has never smoked cigarettes.  He says he has been drinking beer off-and-on since he was 59 years old but following his divorce he drank as many as 20 cans of beer a day for few years.  He was diagnosed with cirrhosis in 2017 and has not touched alcohol since then.  He has 3 sons and 1 daughter and they are all in good health.  Patient lives in in a house that he shares with another tenant.  He states he prepares all his meals and does the usual household work.  His mother had emphysema and died at age 80.  Father lived to be 45.  He apparently had reactions to medication.  He has 3 brothers and 2 sisters living. 2 brothers i.e. twins died within few months of birth.  He lost 4 more brothers and they were in their 27s or 50s.   Past Medical History:  Diagnosis Date  . Cirrhosis ([place])    likely ETOH use   . Diabetes [facility])          Hepatic sarcoidosis diagnosed based  on liver biopsy in March 2019.         He has been evaluated at [facility].  Past Surgical History:  Procedure Laterality Date  . CHOLECYSTECTOMY N/A 09/01/2017   Procedure: LAPAROSCOPIC CHOLECYSTECTOMY;  Surgeon: Jenkins, Mark, MD;  Location: [facility];  Service: General;  Laterality: N/A;  . COLONOSCOPY WITH PROPOFOL N/A 03/06/2017   Dr. Rourk: tubular adenoma removed, next TCS 5 years if health permits.   . ESOPHAGOGASTRODUODENOSCOPY (EGD) WITH PROPOFOL N/A 03/06/2017   Dr. Rourk: grade 1 esophageal varices, erosive esophagitis, portal gastropathy.   . ESOPHAGOGASTRODUODENOSCOPY (EGD) WITH PROPOFOL N/A 05/03/2019   Procedure:  ESOPHAGOGASTRODUODENOSCOPY (EGD) WITH PROPOFOL;  Surgeon: Rourk, Robert M, MD;  Location: [facility];  Service: Endoscopy;  Laterality: N/A;  10:30am  . HERNIA REPAIR Right    RIH  . INGUINAL HERNIA REPAIR Right 07/11/2017   Procedure: HERNIA REPAIR INGUINAL ADULT WITH MESH;  Surgeon: Jenkins, Mark, MD;  Location: [facility];  Service: General;  Laterality: Right;  patient knows to arrive at 7:30  . IR TRANSCATHETER BX  10/14/2017  . IR US GUIDE VASC ACCESS RIGHT  10/14/2017  . IR VENOGRAM HEPATIC WO HEMODYNAMIC EVALUATION  10/14/2017  . MALONEY DILATION N/A 05/03/2019   Procedure: MALONEY DILATION;  Surgeon: Rourk, Robert M, MD;  Location: [facility];  Service: Endoscopy;  Laterality: N/A;  . POLYPECTOMY  03/06/2017   Procedure: POLYPECTOMY;  Surgeon: Rourk, Robert M, MD;  Location: [facility];  Service: Endoscopy;;  ascending colon;    Prior to Admission medications   Medication Sig Start Date End Date Taking? Authorizing Provider  Accu-Chek Softclix Lancets lancets Test BS 4 (four) times daily. Dx E11.69 Patient taking differently: 1 each by Other route 4 (four) times daily as needed. Test BS 4 (four) times daily. Dx E11.69 12/13/19  Yes Hawks, Christy A, FNP  atorvastatin (LIPITOR) 10 MG tablet TAKE 1 TABLET BY MOUTH  DAILY Patient taking differently: Take 15 mg by mouth daily.  07/06/19  Yes Hawks, Christy A, FNP  blood glucose meter kit and supplies Dispense based on patient and insurance preference. Use up to four times daily as directed. (FOR ICD-10 E10.9, E11.9). Patient taking differently: 1 each by Other route once. Dispense based on patient and insurance preference. Use up to four times daily as directed. (FOR ICD-10 E10.9, E11.9). 06/24/19  Yes Hawks, Christy A, FNP  Blood Glucose Monitoring Suppl (ACCU-CHEK GUIDE) w/Device KIT Test BS 4 (four) times daily. Dx E11.69 12/13/19  Yes Hawks, Christy A, FNP  cetirizine (ZYRTEC) 10 MG tablet Take 1 tablet (10 mg total) by mouth daily.  04/22/19  Yes Rakes, Linda M, FNP  DEXILANT 60 MG capsule TAKE 1 CAPSULE BY MOUTH  DAILY Patient taking differently: Take 60 mg by mouth daily.  04/16/19  Yes Hawks, Christy A, FNP  escitalopram (LEXAPRO) 10 MG tablet Take 1 tablet (10 mg total) by mouth daily. 12/15/19  Yes Hawks, Christy A, FNP  fluticasone (FLONASE) 50 MCG/ACT nasal spray Place 2 sprays into both nostrils daily. 09/03/19  Yes Hawks, Christy A, FNP  glucose blood (ACCU-CHEK GUIDE) test strip Test BS 4 (four) times daily. Dx E11.69 Patient taking differently: 1 each by Other route 4 (four) times daily as needed. Test BS 4 (four) times daily. Dx E11.69 12/13/19  Yes Hawks, Christy A, FNP  insulin degludec (TRESIBA FLEXTOUCH) 100 UNIT/ML SOPN FlexTouch Pen Inject 0.15 mLs (15 Units total) into the skin daily. 06/24/19  Yes Hawks, Christy   A, FNP  Insulin Pen Needle (B-D UF III MINI PEN NEEDLES) 31G X 5 MM MISC USE WITH TRESIBA daily Dx E11.69 Patient taking differently: Inject 1 each into the skin daily. USE WITH TRESIBA daily Dx E11.69 09/03/19  Yes Evelina Dun A, FNP  metFORMIN (GLUCOPHAGE XR) 750 MG 24 hr tablet Take 2 tablets (1,500 mg total) by mouth daily with breakfast. 09/03/19  Yes Hawks, Christy A, FNP  spironolactone (ALDACTONE) 50 MG tablet Take 1 tablet (50 mg total) by mouth daily. 12/15/19  Yes Hawks, Christy A, FNP  VITAMIN D PO Take 1 capsule by mouth daily.    Yes [provider]    Current Facility-Administered Medications  Medication Dose Route Frequency Provider Last Rate Last Admin  . octreotide (SANDOSTATIN) 500 mcg in sodium chloride 0.9 % 250 mL (2 mcg/mL) infusion  50 mcg/hr Intravenous Continuous Truddie Hidden, MD 25 mL/hr at 12/22/19 1733 50 mcg/hr at 12/22/19 1733   Current Outpatient Medications  Medication Sig Dispense Refill  . Accu-Chek Softclix Lancets lancets Test BS 4 (four) times daily. Dx E11.69 (Patient taking differently: 1 each by Other route 4 (four) times daily as needed. Test BS  4 (four) times daily. Dx E11.69) 400 each 3  . atorvastatin (LIPITOR) 10 MG tablet TAKE 1 TABLET BY MOUTH  DAILY (Patient taking differently: Take 15 mg by mouth daily. ) 90 tablet 1  . blood glucose meter kit and supplies Dispense based on patient and insurance preference. Use up to four times daily as directed. (FOR ICD-10 E10.9, E11.9). (Patient taking differently: 1 each by Other route once. Dispense based on patient and insurance preference. Use up to four times daily as directed. (FOR ICD-10 E10.9, E11.9).) 1 each 0  . Blood Glucose Monitoring Suppl (ACCU-CHEK GUIDE) w/Device KIT Test BS 4 (four) times daily. Dx E11.69 1 kit 0  . cetirizine (ZYRTEC) 10 MG tablet Take 1 tablet (10 mg total) by mouth daily. 30 tablet 11  . DEXILANT 60 MG capsule TAKE 1 CAPSULE BY MOUTH  DAILY (Patient taking differently: Take 60 mg by mouth daily. ) 90 capsule 3  . escitalopram (LEXAPRO) 10 MG tablet Take 1 tablet (10 mg total) by mouth daily. 90 tablet 0  . fluticasone (FLONASE) 50 MCG/ACT nasal spray Place 2 sprays into both nostrils daily. 16 g 6  . glucose blood (ACCU-CHEK GUIDE) test strip Test BS 4 (four) times daily. Dx E11.69 (Patient taking differently: 1 each by Other route 4 (four) times daily as needed. Test BS 4 (four) times daily. Dx E11.69) 400 each 3  . insulin degludec (TRESIBA FLEXTOUCH) 100 UNIT/ML SOPN FlexTouch Pen Inject 0.15 mLs (15 Units total) into the skin daily. 1 pen 1  . Insulin Pen Needle (B-D UF III MINI PEN NEEDLES) 31G X 5 MM MISC USE WITH TRESIBA daily Dx E11.69 (Patient taking differently: Inject 1 each into the skin daily. USE WITH TRESIBA daily Dx E11.69) 100 each 3  . metFORMIN (GLUCOPHAGE XR) 750 MG 24 hr tablet Take 2 tablets (1,500 mg total) by mouth daily with breakfast. 90 tablet 1  . spironolactone (ALDACTONE) 50 MG tablet Take 1 tablet (50 mg total) by mouth daily. 90 tablet 0  . VITAMIN D PO Take 1 capsule by mouth daily.       Allergies as of 12/22/2019  . (No Known  Allergies)    Family History  Problem Relation Age of Onset  . COPD Mother   . Liver disease Neg Hx   .  Colon cancer Neg Hx   . Colon polyps Neg Hx     Social History   Socioeconomic History  . Marital status: Divorced    Spouse name: Not on file  . Number of children: Not on file  . Years of education: Not on file  . Highest education level: Not on file  Occupational History  . Occupation: unemployed    Comment: disability  Tobacco Use  . Smoking status: Never Smoker  . Smokeless tobacco: Never Used  Substance and Sexual Activity  . Alcohol use: No    Comment: Quit September 2017, heavy alcohol abuse previously  . Drug use: No  . Sexual activity: Not Currently    Partners: Female  Other Topics Concern  . Not on file  Social History Narrative   Lives in [place], [place]. 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    Drank alcohol for years, quit 05/07/16. Has been sober since then. Quit b/c of health/cirrhosis.   Divorced for over 15 years.   Dating sporadically.   Has 3 daughters and 1 son live locally.   Originally from [place], moved here b/c of wife.    Eats all foods.    Wear seatbelt.   Does not drive, does not have a license, lost due to DWI.    Brought by neighbor.    Does not go to church. Believes in God. Prays each night, per report.    Social Determinants of Health   Financial Resource Strain:   . Difficulty of Paying Living Expenses:   Food Insecurity:   . Worried About Charity fundraiser in the Last Year:   . Arboriculturist in the Last Year:   Transportation Needs:   . Film/video editor (Medical):   Marland Kitchen Lack of Transportation (Non-Medical):   Physical Activity:   . Days of Exercise per Week:   . Minutes of Exercise per Session:   Stress:   . Feeling of Stress :   Social Connections:   . Frequency of Communication with Friends and Family:   . Frequency of Social Gatherings with Friends and Family:   . Attends Religious Services:    . Active Member of Clubs or Organizations:   . Attends Archivist Meetings:   Marland Kitchen Marital Status:   Intimate Partner Violence:   . Fear of Current or Ex-Partner:   . Emotionally Abused:   Marland Kitchen Physically Abused:   . Sexually Abused:     Review of Systems: See HPI, otherwise normal ROS  Physical Exam: Temp:  [97.9 F (36.6 C)-98 F (36.7 C)] 97.9 F (36.6 C) (06/02 1656) Pulse Rate:  [65-75] 70 (06/02 1730) Resp:  [18] 18 (06/02 1656) BP: (104-129)/(62-73) 113/71 (06/02 1730) SpO2:  [98 %-100 %] 99 % (06/02 1730) Weight:  [81.6 kg] 81.6 kg (06/02 0900)   Patient is alert and in no acute distress. He does not have asterixis. Conjunctivae is pink.  Sclera is nonicteric. Oropharyngeal mucosa is normal.  He has few remaining teeth in upper and lower jaw in fair condition. Neck without masses thyromegaly or lymphadenopathy. Cardiac exam with regular rhythm normal S1 and S2.  No murmur gallop noted. Auscultation lungs reveal vesicular breath sounds bilaterally. Abdomen is symmetrical.  He has small umbilical hernia.  It is nontender.  He has right inguinal scar.  Abdomen is soft with mild periumbilical tenderness.  No hepatosplenomegaly.  No tenderness noted in epigastric region. He does not have peripheral edema or  clubbing.  Lab Results: Recent Labs    12/22/19 0954  WBC 4.3  HGB 12.4*  HCT 37.6*  PLT 155   BMET Recent Labs    12/22/19 0954  NA 136  K 4.2  CL 103  CO2 25  GLUCOSE 233*  BUN 14  CREATININE 0.78  CALCIUM 9.4   LFT Recent Labs    12/22/19 0954  PROT 7.2  ALBUMIN 3.9  AST 26  ALT 28  ALKPHOS 56  BILITOT 0.8   PT/INR Recent Labs    12/22/19 0954  LABPROT 13.5  INR 1.1    Covid test is pending.   Studies/Results: CT ABDOMEN PELVIS W CONTRAST  Result Date: 12/22/2019 CLINICAL DATA:  Acute generalized abdominal pain. EXAM: CT ABDOMEN AND PELVIS WITH CONTRAST TECHNIQUE: Multidetector CT imaging of the abdomen and pelvis was  performed using the standard protocol following bolus administration of intravenous contrast. CONTRAST:  128m OMNIPAQUE IOHEXOL 300 MG/ML  SOLN COMPARISON:  April 17, 2018. FINDINGS: Lower chest: Minimal right basilar subsegmental atelectasis is noted. Hepatobiliary: Status post cholecystectomy. No biliary dilatation is noted. No focal hepatic abnormality is noted. Possibly minimally nodular hepatic contours are noted suggesting possible hepatic cirrhosis. Pancreas: Unremarkable. No pancreatic ductal dilatation or surrounding inflammatory changes. Spleen: Normal in size without focal abnormality. Adrenals/Urinary Tract: Adrenal glands are unremarkable. Kidneys are normal, without renal calculi, focal lesion, or hydronephrosis. Bladder is unremarkable. Stomach/Bowel: Stomach is within normal limits. Appendix appears normal. No evidence of bowel wall thickening, distention, or inflammatory changes. Vascular/Lymphatic: Aortic atherosclerosis. No enlarged abdominal or pelvic lymph nodes. Reproductive: Prostate is unremarkable. Other: No abdominal wall hernia or abnormality. No abdominopelvic ascites. Musculoskeletal: No acute or significant osseous findings. IMPRESSION: 1. Possibly minimally nodular hepatic contours are noted suggesting hepatic cirrhosis. 2. No other abnormality seen in the abdomen or pelvis. Aortic Atherosclerosis (ICD10-I70.0). Electronically Signed   By: JMarijo ConceptionM.D.   On: 12/22/2019 11:18   I have reviewed CT images with Dr. BThornton Papas  No evidence of ascites splenomegaly or varices. Prominent caudate lobe and subtle contour changes to liver consistent with cirrhosis.  Assessment;  Patient is 59year old Caucasian male with history of cirrhosis secondary to hepatic sarcoidosis and prior alcohol use who was found to have grade 2 esophageal varices on EGD of October 2020 who presents with UGI bleed/bright red blood per rectum.  He is hemodynamically stable. CT does not show  splenomegaly ascites or mesenteric varices.  He has a history of ascites maintained on diuretic therapy. Patient is hemodynamically stable.  Covid test is pending. I suspect we are dealing with lower GI bleed but cannot rule out upper GI bleed with rapid transit time.  He certainly could be bleeding from esophageal varices given that these were grade 2 in October last year. Therefore he needs to be treated with octreotide and pantoprazole infusion until source of bleeding identified.  Recommendations;  Continue n.p.o. status. CBC and metabolic 7 in a.m. Ceftriaxone 1 g IV every 24 hours. Octreotide and pantoprazole infusion as above. Will check angiotensin-converting enzyme with next blood work. Esophagogastroduodenoscopy with possible esophageal variceal banding on 12/23/2019 by Dr. RGala Romney   LOS: 0 days   Ricquel Foulk  12/22/2019, 6:00 PM

## 2019-12-22 NOTE — Consult Note (Signed)
Referring Provider: No ref. provider found Primary Care Physician:  Sharion Balloon, FNP Primary Gastroenterologist:  Dr. Gala Romney  Reason for Consultation:   Rectal bleeding in a patient with known cirrhosis and esophageal varices.  HPI:   Patient is 59 year old Caucasian male who has history of cirrhosis felt to be primarily due to prior alcohol use but he also has hepatic sarcoidosis(liver biopsy in feb,2019 at the time of laparoscopic cholecystectomy) who underwent esophagogastroduodenoscopy in October 2020 for dysphagia and was found to have grade 2 2 columns of esophageal varices as well as portal gastropathy.  He did undergo esophageal dilation without any complications. Patient had a normal bowel movement morning.  Last night around 10 PM he had an urge to have a bowel movement and is 10 passed bright red blood per rectum.  He had few more bowel movements during the night.  He experienced periumbilical abdominal pain.  He did not experience nausea vomiting hematemesis diaphoreses or lightheadedness. Patient reported to emergency room around 9:00 this morning.  He was evaluated by Dr. Roderic Palau. Lab studies are pertinent for H&H of 12.4 and 37.6 and platelet count of 150 5K.  INR was 1.1.  Bili was 0.8 with AST of 26 ALT of 28 and albumin of 3.9 and AP was 56.  His glucose was 233 with BUN of 14 and creatinine of 0.78.  He also underwent abdominal pelvic CT with contrast revealing evidence of cholecystectomy.  Subtle contour changes to liver prominent caudate lobe but no evidence of ascites splenomegaly or varices. Patient states he has had 4 bowel movements since he has been in emergency room and he is passing fresh blood.  He describes it to be small to moderate amount.  He says heartburn is well controlled with PPI.  He does not take aspirin or other OTC NSAIDs.  He has good appetite and has not lost any weight.  He has noted increase in blood glucose levels over the last few days.  He has not  experienced cough shortness of breath fever or chills. He complains of pain in right inguinal region which she has had ever since he had hernia repair in December 2018.  He underwent surveillance colonoscopy in August 2018 with removal of small polyp from ascending colon and was a tubular adenoma.  Patient is divorced.  He works with a company as a Curator.  He is not disabled.  He has never smoked cigarettes.  He says he has been drinking beer off-and-on since he was 59 years old but following his divorce he drank as many as 20 cans of beer a day for few years.  He was diagnosed with cirrhosis in 2017 and has not touched alcohol since then.  He has 3 sons and 1 daughter and they are all in good health.  Patient lives in in a house that he shares with another tenant.  He states he prepares all his meals and does the usual household work.  His mother had emphysema and died at age 80.  Father lived to be 45.  He apparently had reactions to medication.  He has 3 brothers and 2 sisters living. 2 brothers i.e. twins died within few months of birth.  He lost 4 more brothers and they were in their 27s or 50s.   Past Medical History:  Diagnosis Date  . Cirrhosis ([place])    likely ETOH use   . Diabetes [facility])          Hepatic sarcoidosis diagnosed based  on liver biopsy in March 2019.         He has been evaluated at [facility]  Past Surgical History:  Procedure Laterality Date  . CHOLECYSTECTOMY N/A 09/01/2017   Procedure: LAPAROSCOPIC CHOLECYSTECTOMY;  Surgeon: Aviva Signs, MD;  Location: [facility] ORS;  Service: General;  Laterality: N/A;  . COLONOSCOPY WITH PROPOFOL N/A 03/06/2017   Dr. Gala Romney: tubular adenoma removed, next TCS 5 years if health permits.   . ESOPHAGOGASTRODUODENOSCOPY (EGD) WITH PROPOFOL N/A 03/06/2017   Dr. Gala Romney: grade 1 esophageal varices, erosive esophagitis, portal gastropathy.   . ESOPHAGOGASTRODUODENOSCOPY (EGD) WITH PROPOFOL N/A 05/03/2019   Procedure:  ESOPHAGOGASTRODUODENOSCOPY (EGD) WITH PROPOFOL;  Surgeon: Daneil Dolin, MD;  Location: [facility] ENDO SUITE;  Service: Endoscopy;  Laterality: N/A;  10:30am  . HERNIA REPAIR Right    RIH  . INGUINAL HERNIA REPAIR Right 07/11/2017   Procedure: HERNIA REPAIR INGUINAL ADULT WITH MESH;  Surgeon: Aviva Signs, MD;  Location: [facility] OR[facility];  Service: General;  Laterality: Right;  patient knows to arrive at 7:30  . IR TRANSCATHETER BX  10/14/2017  . IR US GUIDE VASC ACCESS RIGHT  10/14/2017  . IR VENOGRAM HEPATIC WO HEMODYNAMIC EVALUATION  10/14/2017  . MALONEY DILATION N/A 05/03/2019   Procedure: Venia Minks DILATION;  Surgeon: Daneil Dolin, MD;  Location: [facility];  Service: Endoscopy;  Laterality: N/A;  . POLYPECTOMY  03/06/2017   Procedure: POLYPECTOMY;  Surgeon: Daneil Dolin, MD;  Location: [facility];  Service: Endoscopy;;  ascending colon;    Prior to Admission medications   Medication Sig Start Date End Date Taking? Authorizing Provider  Accu-Chek Softclix Lancets lancets Test BS 4 (four) times daily. Dx E11.69 Patient taking differently: 1 each by Other route 4 (four) times daily as needed. Test BS 4 (four) times daily. Dx E11.69 12/13/19  Yes Hawks, Christy A, FNP  atorvastatin (LIPITOR) 10 MG tablet TAKE 1 TABLET BY MOUTH  DAILY Patient taking differently: Take 15 mg by mouth daily.  07/06/19  Yes Hawks, Theador Hawthorne, FNP  blood glucose meter kit and supplies Dispense based on patient and insurance preference. Use up to four times daily as directed. (FOR ICD-10 E10.9, E11.9). Patient taking differently: 1 each by Other route once. Dispense based on patient and insurance preference. Use up to four times daily as directed. (FOR ICD-10 E10.9, E11.9). 06/24/19  Yes Hawks, Christy A, FNP  Blood Glucose Monitoring Suppl (ACCU-CHEK GUIDE) w/Device KIT Test BS 4 (four) times daily. Dx E11.69 12/13/19  Yes Hawks, Christy A, FNP  cetirizine (ZYRTEC) 10 MG tablet Take 1 tablet (10 mg total) by mouth daily.  04/22/19  Yes Rakes, Connye Burkitt, FNP  DEXILANT 60 MG capsule TAKE 1 CAPSULE BY MOUTH  DAILY Patient taking differently: Take 60 mg by mouth daily.  04/16/19  Yes Hawks, Christy A, FNP  escitalopram (LEXAPRO) 10 MG tablet Take 1 tablet (10 mg total) by mouth daily. 12/15/19  Yes Hawks, Christy A, FNP  fluticasone (FLONASE) 50 MCG/ACT nasal spray Place 2 sprays into both nostrils daily. 09/03/19  Yes Hawks, Christy A, FNP  glucose blood (ACCU-CHEK GUIDE) test strip Test BS 4 (four) times daily. Dx E11.69 Patient taking differently: 1 each by Other route 4 (four) times daily as needed. Test BS 4 (four) times daily. Dx E11.69 12/13/19  Yes Hawks, Christy A, FNP  insulin degludec (TRESIBA FLEXTOUCH) 100 UNIT/ML SOPN FlexTouch Pen Inject 0.15 mLs (15 Units total) into the skin daily. 06/24/19  Yes Hawks, Barn[place]all  A, FNP  Insulin Pen Needle (B-D UF III MINI PEN NEEDLES) 31G X 5 MM MISC USE WITH TRESIBA daily Dx E11.69 Patient taking differently: Inject 1 each into the skin daily. USE WITH TRESIBA daily Dx E11.69 09/03/19  Yes Evelina Dun A, FNP  metFORMIN (GLUCOPHAGE XR) 750 MG 24 hr tablet Take 2 tablets (1,500 mg total) by mouth daily with breakfast. 09/03/19  Yes Hawks, Christy A, FNP  spironolactone (ALDACTONE) 50 MG tablet Take 1 tablet (50 mg total) by mouth daily. 12/15/19  Yes Hawks, Christy A, FNP  VITAMIN D PO Take 1 capsule by mouth daily.    Yes [provider]    Current Facility-Administered Medications  Medication Dose Route Frequency Provider Last Rate Last Admin  . octreotide (SANDOSTATIN) 500 mcg in sodium chloride 0.9 % 250 mL (2 mcg/mL) infusion  50 mcg/hr Intravenous Continuous Truddie Hidden, MD 25 mL/hr at 12/22/19 1733 50 mcg/hr at 12/22/19 1733   Current Outpatient Medications  Medication Sig Dispense Refill  . Accu-Chek Softclix Lancets lancets Test BS 4 (four) times daily. Dx E11.69 (Patient taking differently: 1 each by Other route 4 (four) times daily as needed. Test BS  4 (four) times daily. Dx E11.69) 400 each 3  . atorvastatin (LIPITOR) 10 MG tablet TAKE 1 TABLET BY MOUTH  DAILY (Patient taking differently: Take 15 mg by mouth daily. ) 90 tablet 1  . blood glucose meter kit and supplies Dispense based on patient and insurance preference. Use up to four times daily as directed. (FOR ICD-10 E10.9, E11.9). (Patient taking differently: 1 each by Other route once. Dispense based on patient and insurance preference. Use up to four times daily as directed. (FOR ICD-10 E10.9, E11.9).) 1 each 0  . Blood Glucose Monitoring Suppl (ACCU-CHEK GUIDE) w/Device KIT Test BS 4 (four) times daily. Dx E11.69 1 kit 0  . cetirizine (ZYRTEC) 10 MG tablet Take 1 tablet (10 mg total) by mouth daily. 30 tablet 11  . DEXILANT 60 MG capsule TAKE 1 CAPSULE BY MOUTH  DAILY (Patient taking differently: Take 60 mg by mouth daily. ) 90 capsule 3  . escitalopram (LEXAPRO) 10 MG tablet Take 1 tablet (10 mg total) by mouth daily. 90 tablet 0  . fluticasone (FLONASE) 50 MCG/ACT nasal spray Place 2 sprays into both nostrils daily. 16 g 6  . glucose blood (ACCU-CHEK GUIDE) test strip Test BS 4 (four) times daily. Dx E11.69 (Patient taking differently: 1 each by Other route 4 (four) times daily as needed. Test BS 4 (four) times daily. Dx E11.69) 400 each 3  . insulin degludec (TRESIBA FLEXTOUCH) 100 UNIT/ML SOPN FlexTouch Pen Inject 0.15 mLs (15 Units total) into the skin daily. 1 pen 1  . Insulin Pen Needle (B-D UF III MINI PEN NEEDLES) 31G X 5 MM MISC USE WITH TRESIBA daily Dx E11.69 (Patient taking differently: Inject 1 each into the skin daily. USE WITH TRESIBA daily Dx E11.69) 100 each 3  . metFORMIN (GLUCOPHAGE XR) 750 MG 24 hr tablet Take 2 tablets (1,500 mg total) by mouth daily with breakfast. 90 tablet 1  . spironolactone (ALDACTONE) 50 MG tablet Take 1 tablet (50 mg total) by mouth daily. 90 tablet 0  . VITAMIN D PO Take 1 capsule by mouth daily.       Allergies as of 12/22/2019  . (No Known  Allergies)    Family History  Problem Relation Age of Onset  . COPD Mother   . Liver disease Neg Hx   .  Colon cancer Neg Hx   . Colon polyps Neg Hx     Social History   Socioeconomic History  . Marital status: Divorced    Spouse name: Not on file  . Number of children: Not on file  . Years of education: Not on file  . Highest education level: Not on file  Occupational History  . Occupation: unemployed    Comment: disability  Tobacco Use  . Smoking status: Never Smoker  . Smokeless tobacco: Never Used  Substance and Sexual Activity  . Alcohol use: No    Comment: Quit September 2017, heavy alcohol abuse previously  . Drug use: No  . Sexual activity: Not Currently    Partners: Female  Other Topics Concern  . Not on file  Social History Narrative   Lives in [place], [place]. 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    Drank alcohol for years, quit 05/07/16. Has been sober since then. Quit b/c of health/cirrhosis.   Divorced for over 15 years.   Dating sporadically.   Has 3 daughters and 1 son live locally.   Originally from [place], moved here b/c of wife.    Eats all foods.    Wear seatbelt.   Does not drive, does not have a license, lost due to DWI.    Brought by neighbor.    Does not go to church. Believes in God. Prays each night, per report.    Social Determinants of Health   Financial Resource Strain:   . Difficulty of Paying Living Expenses:   Food Insecurity:   . Worried About Charity fundraiser in the Last Year:   . Arboriculturist in the Last Year:   Transportation Needs:   . Film/video editor (Medical):   Marland Kitchen Lack of Transportation (Non-Medical):   Physical Activity:   . Days of Exercise per Week:   . Minutes of Exercise per Session:   Stress:   . Feeling of Stress :   Social Connections:   . Frequency of Communication with Friends and Family:   . Frequency of Social Gatherings with Friends and Family:   . Attends Religious Services:    . Active Member of Clubs or Organizations:   . Attends Archivist Meetings:   Marland Kitchen Marital Status:   Intimate Partner Violence:   . Fear of Current or Ex-Partner:   . Emotionally Abused:   Marland Kitchen Physically Abused:   . Sexually Abused:     Review of Systems: See HPI, otherwise normal ROS  Physical Exam: Temp:  [97.9 F (36.6 C)-98 F (36.7 C)] 97.9 F (36.6 C) (06/02 1656) Pulse Rate:  [65-75] 70 (06/02 1730) Resp:  [18] 18 (06/02 1656) BP: (104-129)/(62-73) 113/71 (06/02 1730) SpO2:  [98 %-100 %] 99 % (06/02 1730) Weight:  [81.6 kg] 81.6 kg (06/02 0900)   Patient is alert and in no acute distress. He does not have asterixis. Conjunctivae is pink.  Sclera is nonicteric. Oropharyngeal mucosa is normal.  He has few remaining teeth in upper and lower jaw in fair condition. Neck without masses thyromegaly or lymphadenopathy. Cardiac exam with regular rhythm normal S1 and S2.  No murmur gallop noted. Auscultation lungs reveal vesicular breath sounds bilaterally. Abdomen is symmetrical.  He has small umbilical hernia.  It is nontender.  He has right inguinal scar.  Abdomen is soft with mild periumbilical tenderness.  No hepatosplenomegaly.  No tenderness noted in epigastric region. He does not have peripheral edema or  clubbing.  Lab Results: Recent Labs    12/22/19 0954  WBC 4.3  HGB 12.4*  HCT 37.6*  PLT 155   BMET Recent Labs    12/22/19 0954  NA 136  K 4.2  CL 103  CO2 25  GLUCOSE 233*  BUN 14  CREATININE 0.78  CALCIUM 9.4   LFT Recent Labs    12/22/19 0954  PROT 7.2  ALBUMIN 3.9  AST 26  ALT 28  ALKPHOS 56  BILITOT 0.8   PT/INR Recent Labs    12/22/19 0954  LABPROT 13.5  INR 1.1    Covid test is pending.   Studies/Results: CT ABDOMEN PELVIS W CONTRAST  Result Date: 12/22/2019 CLINICAL DATA:  Acute generalized abdominal pain. EXAM: CT ABDOMEN AND PELVIS WITH CONTRAST TECHNIQUE: Multidetector CT imaging of the abdomen and pelvis was  performed using the standard protocol following bolus administration of intravenous contrast. CONTRAST:  128m OMNIPAQUE IOHEXOL 300 MG/ML  SOLN COMPARISON:  April 17, 2018. FINDINGS: Lower chest: Minimal right basilar subsegmental atelectasis is noted. Hepatobiliary: Status post cholecystectomy. No biliary dilatation is noted. No focal hepatic abnormality is noted. Possibly minimally nodular hepatic contours are noted suggesting possible hepatic cirrhosis. Pancreas: Unremarkable. No pancreatic ductal dilatation or surrounding inflammatory changes. Spleen: Normal in size without focal abnormality. Adrenals/Urinary Tract: Adrenal glands are unremarkable. Kidneys are normal, without renal calculi, focal lesion, or hydronephrosis. Bladder is unremarkable. Stomach/Bowel: Stomach is within normal limits. Appendix appears normal. No evidence of bowel wall thickening, distention, or inflammatory changes. Vascular/Lymphatic: Aortic atherosclerosis. No enlarged abdominal or pelvic lymph nodes. Reproductive: Prostate is unremarkable. Other: No abdominal wall hernia or abnormality. No abdominopelvic ascites. Musculoskeletal: No acute or significant osseous findings. IMPRESSION: 1. Possibly minimally nodular hepatic contours are noted suggesting hepatic cirrhosis. 2. No other abnormality seen in the abdomen or pelvis. Aortic Atherosclerosis (ICD10-I70.0). Electronically Signed   By: JMarijo ConceptionM.D.   On: 12/22/2019 11:18   I have reviewed CT images with Dr. BThornton Papas  No evidence of ascites splenomegaly or varices. Prominent caudate lobe and subtle contour changes to liver consistent with cirrhosis.  Assessment;  Patient is 59year old Caucasian male with history of cirrhosis secondary to hepatic sarcoidosis and prior alcohol use who was found to have grade 2 esophageal varices on EGD of October 2020 who presents with UGI bleed/bright red blood per rectum.  He is hemodynamically stable. CT does not show  splenomegaly ascites or mesenteric varices.  He has a history of ascites maintained on diuretic therapy. Patient is hemodynamically stable.  Covid test is pending. I suspect we are dealing with lower GI bleed but cannot rule out upper GI bleed with rapid transit time.  He certainly could be bleeding from esophageal varices given that these were grade 2 in October last year. Therefore he needs to be treated with octreotide and pantoprazole infusion until source of bleeding identified.  Recommendations;  Continue n.p.o. status. CBC and metabolic 7 in a.m. Ceftriaxone 1 g IV every 24 hours. Octreotide and pantoprazole infusion as above. Will check angiotensin-converting enzyme with next blood work. Esophagogastroduodenoscopy with possible esophageal variceal banding on 12/23/2019 by Dr. RGala Romney   LOS: 0 days   Toby Ayad  12/22/2019, 6:00 PM

## 2019-12-22 NOTE — ED Notes (Signed)
Patient transported to CT 

## 2019-12-22 NOTE — ED Triage Notes (Signed)
Pt reports episode last night and episode this morning of red blood in stool. No pain now, abdomen only hurts when he has to use the bathroom

## 2019-12-22 NOTE — H&P (Signed)
History and Physical    Thomas Pratt ZOX:[id number] DOB: 1961/02/07 DOA: 12/22/2019  PCP: Sharion Balloon, FNP   Patient coming from: Home  I have personally briefly reviewed patient's old medical records in [place]  Chief Complaint: Bloody stools  HPI: Thomas Pratt is a 59 y.o. male with medical history significant for alcoholic liver cirrhosis, esophageal varices, CVA, hypertension, diabetes mellitus. Patient presented to the ED with complaints of blood in stools.  Has had 4 episodes today including here in the ED.  Reports some diffuse abdominal discomfort.  He denies vomiting.  No dizziness.  He reports his last drink of alcohol was 4 years ago.  Reports compliance with his medications which include PPI, but ran out of his spironolactone about a week ago.  ED Course: Blood pressure systolic 811-914, hemoglobin 12.4, baseline 13-15, platelets 155, BUN 14..  Abdominal/pelvic CT with contrast suggests hepatic cirrhosis without other abnormality.  Started on octreotide infusion, GI was consulted.  Review of Systems: As per HPI all other systems reviewed and negative.  Past Medical History:  Diagnosis Date  . Cirrhosis ([place])    likely ETOH use   . Diabetes [facility])     Past Surgical History:  Procedure Laterality Date  . CHOLECYSTECTOMY N/A 09/01/2017   Procedure: LAPAROSCOPIC CHOLECYSTECTOMY;  Surgeon: Aviva Signs, MD;  Location: [facility] ORS;  Service: General;  Laterality: N/A;  . COLONOSCOPY WITH PROPOFOL N/A 03/06/2017   Dr. Gala Romney: tubular adenoma removed, next TCS 5 years if health permits.   . ESOPHAGOGASTRODUODENOSCOPY (EGD) WITH PROPOFOL N/A 03/06/2017   Dr. Gala Romney: grade 1 esophageal varices, erosive esophagitis, portal gastropathy.   . ESOPHAGOGASTRODUODENOSCOPY (EGD) WITH PROPOFOL N/A 05/03/2019   Procedure: ESOPHAGOGASTRODUODENOSCOPY (EGD) WITH PROPOFOL;  Surgeon: Daneil Dolin, MD;  Location: [facility] ENDO SUITE;  Service: Endoscopy;  Laterality: N/A;  10:30am    . HERNIA REPAIR Right    RIH  . INGUINAL HERNIA REPAIR Right 07/11/2017   Procedure: HERNIA REPAIR INGUINAL ADULT WITH MESH;  Surgeon: Aviva Signs, MD;  Location: [facility] ORS;  Service: General;  Laterality: Right;  patient knows to arrive at 7:30  . IR TRANSCATHETER BX  10/14/2017  . IR US GUIDE VASC ACCESS RIGHT  10/14/2017  . IR VENOGRAM HEPATIC WO HEMODYNAMIC EVALUATION  10/14/2017  . MALONEY DILATION N/A 05/03/2019   Procedure: Venia Minks DILATION;  Surgeon: Daneil Dolin, MD;  Location: [facility];  Service: Endoscopy;  Laterality: N/A;  . POLYPECTOMY  03/06/2017   Procedure: POLYPECTOMY;  Surgeon: Daneil Dolin, MD;  Location: [facility] END[facility];  Service: Endoscopy;;  ascending colon;     reports that he has never smoked. He has never used smokeless tobacco. He reports that he does not drink alcohol or use drugs.  No Known Allergies  Family History  Problem Relation Age of Onset  . COPD Mother   . Liver disease Neg Hx   . Colon cancer Neg Hx   . Colon polyps Neg Hx     Prior to Admission medications   Medication Sig Start Date End Date Taking? Authorizing Provider  Accu-Chek Softclix Lancets lancets Test BS 4 (four) times daily. Dx E11.69 Patient taking differently: 1 each by Other route 4 (four) times daily as needed. Test BS 4 (four) times daily. Dx E11.69 12/13/19  Yes Hawks, Christy A, FNP  atorvastatin (LIPITOR) 10 MG tablet TAKE 1 TABLET BY MOUTH  DAILY Patient taking differently: Take 15 mg by mouth daily.  07/06/19  Yes Hawks, [place] A,  FNP  blood glucose meter kit and supplies Dispense based on patient and insurance preference. Use up to four times daily as directed. (FOR ICD-10 E10.9, E11.9). Patient taking differently: 1 each by Other route once. Dispense based on patient and insurance preference. Use up to four times daily as directed. (FOR ICD-10 E10.9, E11.9). 06/24/19  Yes Hawks, Christy A, FNP  Blood Glucose Monitoring Suppl (ACCU-CHEK GUIDE) w/Device KIT Test BS 4  (four) times daily. Dx E11.69 12/13/19  Yes Hawks, Christy A, FNP  cetirizine (ZYRTEC) 10 MG tablet Take 1 tablet (10 mg total) by mouth daily. 04/22/19  Yes Rakes, Connye Burkitt, FNP  DEXILANT 60 MG capsule TAKE 1 CAPSULE BY MOUTH  DAILY Patient taking differently: Take 60 mg by mouth daily.  04/16/19  Yes Hawks, Christy A, FNP  escitalopram (LEXAPRO) 10 MG tablet Take 1 tablet (10 mg total) by mouth daily. 12/15/19  Yes Hawks, Christy A, FNP  fluticasone (FLONASE) 50 MCG/ACT nasal spray Place 2 sprays into both nostrils daily. 09/03/19  Yes Hawks, Christy A, FNP  glucose blood (ACCU-CHEK GUIDE) test strip Test BS 4 (four) times daily. Dx E11.69 Patient taking differently: 1 each by Other route 4 (four) times daily as needed. Test BS 4 (four) times daily. Dx E11.69 12/13/19  Yes Hawks, Christy A, FNP  insulin degludec (TRESIBA FLEXTOUCH) 100 UNIT/ML SOPN FlexTouch Pen Inject 0.15 mLs (15 Units total) into the skin daily. 06/24/19  Yes Hawks, Christy A, FNP  Insulin Pen Needle (B-D UF III MINI PEN NEEDLES) 31G X 5 MM MISC USE WITH TRESIBA daily Dx E11.69 Patient taking differently: Inject 1 each into the skin daily. USE WITH TRESIBA daily Dx E11.69 09/03/19  Yes Evelina Dun A, FNP  metFORMIN (GLUCOPHAGE XR) 750 MG 24 hr tablet Take 2 tablets (1,500 mg total) by mouth daily with breakfast. 09/03/19  Yes Hawks, Christy A, FNP  spironolactone (ALDACTONE) 50 MG tablet Take 1 tablet (50 mg total) by mouth daily. 12/15/19  Yes Hawks, Christy A, FNP  VITAMIN D PO Take 1 capsule by mouth daily.    Yes [provider]    Physical Exam: Vitals:   12/22/19 1100 12/22/19 1440 12/22/19 1500 12/22/19 1530  BP: 119/68 111/62 109/68 104/68  Pulse: 67 67    Resp:      Temp:      TempSrc:      SpO2: 99% 100%    Weight:      Height:        Constitutional: NAD, calm, comfortable Vitals:   12/22/19 1100 12/22/19 1440 12/22/19 1500 12/22/19 1530  BP: 119/68 111/62 109/68 104/68  Pulse: 67 67    Resp:        Temp:      TempSrc:      SpO2: 99% 100%    Weight:      Height:       Eyes: PERRL, lids and conjunctivae normal ENMT: Mucous membranes are moist.  Neck: normal, supple, no masses, no thyromegaly Respiratory: clear to auscultation bilaterally, no wheezing, no crackles. Normal respiratory effort. No accessory muscle use.  Cardiovascular: Regular rate and rhythm, no murmurs / rubs / gallops. No extremity edema. 2+ pedal pulses.  Abdomen: no tenderness, soft, , no masses palpated. No hepatosplenomegaly. Bowel sounds positive.  Musculoskeletal: no clubbing / cyanosis. No joint deformity upper and lower extremities. Good ROM, no contractures. Normal muscle tone.  Skin: no rashes, lesions, ulcers. No induration Neurologic: No apparent cranial nerve abnormality, moving all extremities  spontaneously. Psychiatric: Normal judgment and insight. Alert and oriented x 3. Normal mood.   Labs on Admission: I have personally reviewed following labs and imaging studies  CBC: Recent Labs  Lab 12/22/19 0954  WBC 4.3  NEUTROABS 2.4  HGB 12.4*  HCT 37.6*  MCV 95.7  PLT 741   Basic Metabolic Panel: Recent Labs  Lab 12/22/19 0954  NA 136  K 4.2  CL 103  CO2 25  GLUCOSE 233*  BUN 14  CREATININE 0.78  CALCIUM 9.4   Liver Function Tests: Recent Labs  Lab 12/22/19 0954  AST 26  ALT 28  ALKPHOS 56  BILITOT 0.8  PROT 7.2  ALBUMIN 3.9   Coagulation Profile: Recent Labs  Lab 12/22/19 0954  INR 1.1    Radiological Exams on Admission: CT ABDOMEN PELVIS W CONTRAST  Result Date: 12/22/2019 CLINICAL DATA:  Acute generalized abdominal pain. EXAM: CT ABDOMEN AND PELVIS WITH CONTRAST TECHNIQUE: Multidetector CT imaging of the abdomen and pelvis was performed using the standard protocol following bolus administration of intravenous contrast. CONTRAST:  170m OMNIPAQUE IOHEXOL 300 MG/ML  SOLN COMPARISON:  April 17, 2018. FINDINGS: Lower chest: Minimal right basilar subsegmental atelectasis  is noted. Hepatobiliary: Status post cholecystectomy. No biliary dilatation is noted. No focal hepatic abnormality is noted. Possibly minimally nodular hepatic contours are noted suggesting possible hepatic cirrhosis. Pancreas: Unremarkable. No pancreatic ductal dilatation or surrounding inflammatory changes. Spleen: Normal in size without focal abnormality. Adrenals/Urinary Tract: Adrenal glands are unremarkable. Kidneys are normal, without renal calculi, focal lesion, or hydronephrosis. Bladder is unremarkable. Stomach/Bowel: Stomach is within normal limits. Appendix appears normal. No evidence of bowel wall thickening, distention, or inflammatory changes. Vascular/Lymphatic: Aortic atherosclerosis. No enlarged abdominal or pelvic lymph nodes. Reproductive: Prostate is unremarkable. Other: No abdominal wall hernia or abnormality. No abdominopelvic ascites. Musculoskeletal: No acute or significant osseous findings. IMPRESSION: 1. Possibly minimally nodular hepatic contours are noted suggesting hepatic cirrhosis. 2. No other abnormality seen in the abdomen or pelvis. Aortic Atherosclerosis (ICD10-I70.0). Electronically Signed   By: JMarijo ConceptionM.D.   On: 12/22/2019 11:18    EKG: None.   Assessment/Plan Principal Problem:   GI bleed Active Problems:   HTN (hypertension)   ALC (alcoholic liver cirrhosis) (HCC)   Type 2 diabetes mellitus with other specified complication (HCC)   Acute GI bleed- bloody stools, hemoglobin 12.4 mild downtrend from baseline 13-15, blood pressure soft to stable.  Reports abdominal pain, but abdominal CT pelvis with contrast without acute abnormality.  Lower GI bleed versus brisk upper GI bleed.  History of grade 2 esophageal varices.  INR 1.1, platelets 155. -Continue IV octreotide, IV Protonix bolus and infusion -Monitor hemoglobin closely- CBC q6h x 3 - Goal hgb > 8. -N.p.o. -Gastroenterology consulted in ED, plans for EGD tomorrow, continue IV antibiotics -5019m bolus given, continue Gentle hydration with N/s 75cc/hr x 15hrs -CBC, BMP a.m.  -Angiotensin-converting enzyme ordered, per GI  Alcoholic liver cirrhosis- quit drinking alcohol 4 years ago. -Hold spironolactone for now, in the setting of GI blood loss  Diabetes mellitus-random glucose 233. -Hemoglobin A1c - SSI- S -Hold home Tresiba 15 units daily while n.p.o., hold Metformin  Hypertension-soft to stable - Hold spironolactone for now  DVT prophylaxis: SCDs Code Status: Full code Family Communication: None at bedside. Disposition Plan: ~ 2 days, pending GI evaluation, resolution of GI blood loss Consults called: Gastroenterology Admission status: Inpatient, stepdown  I certify that at the point of admission it is my clinical judgment that  the patient will require inpatient hospital care spanning beyond 2 midnights from the point of admission due to high intensity of service, high risk for further deterioration and high frequency of surveillance required. The following factors support the patient status of inpatient: Acute GI bleed, on continuous infusion, requiring close monitoring.   Bethena Roys MD [facility]  12/22/2019, 7:33 PM

## 2019-12-22 NOTE — Telephone Encounter (Signed)
FYI for provider:  He is going to ED this morning and will call back if they prefer him moving appointment to closer date.

## 2019-12-22 NOTE — ED Provider Notes (Signed)
[facility] EMERGENCY DEPARTMENT Provider Note   CSN: [id number] Arrival date & time: 12/22/19  3500     History Chief Complaint  Patient presents with  . Blood In Stools    Thomas Pratt is a 59 y.o. male.  Patient complains of rectal bleeding since yesterday.  Patient has a history of liver disease  The history is provided by the patient. No language interpreter was used.  Rectal Bleeding Quality:  Bright red Amount:  Moderate Timing:  Constant Chronicity:  New Context: not anal fissures   Similar prior episodes: no   Relieved by:  Nothing Worsened by:  Nothing Ineffective treatments:  None tried Associated symptoms: no abdominal pain        Past Medical History:  Diagnosis Date  . Cirrhosis ([place])    likely ETOH use   . Diabetes [facility])     Patient Active Problem List   Diagnosis Date Noted  . Depression, major, single episode, mild ([place]) 05/20/2019  . Dysphagia 03/01/2019  . CVA (cerebral vascular accident) ([place]) 11/08/2018  . History of ETOH abuse 02/25/2018  . Edema 12/05/2017  . Abdominal pain 11/05/2017  . Primary biliary cholangitis ([place]) 11/05/2017  . Type 2 diabetes mellitus with other specified complication ([place]) 93/81/8299  . Abnormal LFTs 09/16/2017  . Calculus of gallbladder without cholecystitis without obstruction   . Dyspepsia 07/31/2017  . Inguinal hernia 11/14/2016  . Lower extremity edema   . Cirrhosis ([place]) 05/21/2016  . Hypoalbuminemia 05/21/2016  . Hyponatremia 05/21/2016  . Hypokalemia 05/21/2016  . HTN (hypertension) 05/21/2016  . ALC (alcoholic liver cirrhosis) ([place]) 05/21/2016    Past Surgical History:  Procedure Laterality Date  . CHOLECYSTECTOMY N/A 09/01/2017   Procedure: LAPAROSCOPIC CHOLECYSTECTOMY;  Surgeon: Aviva Signs, MD;  Location: [facility] ORS;  Service: General;  Laterality: N/A;  . COLONOSCOPY WITH PROPOFOL N/A 03/06/2017   Dr. Gala Romney: tubular adenoma removed, next TCS 5 years if health permits.   .  ESOPHAGOGASTRODUODENOSCOPY (EGD) WITH PROPOFOL N/A 03/06/2017   Dr. Gala Romney: grade 1 esophageal varices, erosive esophagitis, portal gastropathy.   . ESOPHAGOGASTRODUODENOSCOPY (EGD) WITH PROPOFOL N/A 05/03/2019   Procedure: ESOPHAGOGASTRODUODENOSCOPY (EGD) WITH PROPOFOL;  Surgeon: Daneil Dolin, MD;  Location: [facility];  Service: Endoscopy;  Laterality: N/A;  10:30am  . HERNIA REPAIR Right    RIH  . INGUINAL HERNIA REPAIR Right 07/11/2017   Procedure: HERNIA REPAIR INGUINAL ADULT WITH MESH;  Surgeon: Aviva Signs, MD;  Location: [facility] ORS;  Service: General;  Laterality: Right;  patient knows to arrive at 7:30  . IR TRANSCATHETER BX  10/14/2017  . IR US GUIDE VASC ACCESS RIGHT  10/14/2017  . IR VENOGRAM HEPATIC WO HEMODYNAMIC EVALUATION  10/14/2017  . MALONEY DILATION N/A 05/03/2019   Procedure: Venia Minks DILATION;  Surgeon: Daneil Dolin, MD;  Location: [facility];  Service: Endoscopy;  Laterality: N/A;  . POLYPECTOMY  03/06/2017   Procedure: POLYPECTOMY;  Surgeon: Daneil Dolin, MD;  Location: [facility];  Service: Endoscopy;;  ascending colon;       Family History  Problem Relation Age of Onset  . COPD Mother   . Liver disease Neg Hx   . Colon cancer Neg Hx   . Colon polyps Neg Hx     Social History   Tobacco Use  . Smoking status: Never Smoker  . Smokeless tobacco: Never Used  Substance Use Topics  . Alcohol use: No    Comment: Quit September 2017, heavy alcohol abuse previously  . Drug  use: No    Home Medications Prior to Admission medications   Medication Sig Start Date End Date Taking? Authorizing Provider  Accu-Chek Softclix Lancets lancets Test BS 4 (four) times daily. Dx E11.69 Patient taking differently: 1 each by Other route 4 (four) times daily as needed. Test BS 4 (four) times daily. Dx E11.69 12/13/19  Yes Hawks, Christy A, FNP  atorvastatin (LIPITOR) 10 MG tablet TAKE 1 TABLET BY MOUTH  DAILY Patient taking differently: Take 15 mg by mouth daily.   07/06/19  Yes Hawks, Theador Hawthorne, FNP  blood glucose meter kit and supplies Dispense based on patient and insurance preference. Use up to four times daily as directed. (FOR ICD-10 E10.9, E11.9). Patient taking differently: 1 each by Other route once. Dispense based on patient and insurance preference. Use up to four times daily as directed. (FOR ICD-10 E10.9, E11.9). 06/24/19  Yes Hawks, Christy A, FNP  Blood Glucose Monitoring Suppl (ACCU-CHEK GUIDE) w/Device KIT Test BS 4 (four) times daily. Dx E11.69 12/13/19  Yes Hawks, Christy A, FNP  cetirizine (ZYRTEC) 10 MG tablet Take 1 tablet (10 mg total) by mouth daily. 04/22/19  Yes Rakes, Connye Burkitt, FNP  DEXILANT 60 MG capsule TAKE 1 CAPSULE BY MOUTH  DAILY Patient taking differently: Take 60 mg by mouth daily.  04/16/19  Yes Hawks, Christy A, FNP  escitalopram (LEXAPRO) 10 MG tablet Take 1 tablet (10 mg total) by mouth daily. 12/15/19  Yes Hawks, Christy A, FNP  fluticasone (FLONASE) 50 MCG/ACT nasal spray Place 2 sprays into both nostrils daily. 09/03/19  Yes Hawks, Christy A, FNP  glucose blood (ACCU-CHEK GUIDE) test strip Test BS 4 (four) times daily. Dx E11.69 Patient taking differently: 1 each by Other route 4 (four) times daily as needed. Test BS 4 (four) times daily. Dx E11.69 12/13/19  Yes Hawks, Christy A, FNP  insulin degludec (TRESIBA FLEXTOUCH) 100 UNIT/ML SOPN FlexTouch Pen Inject 0.15 mLs (15 Units total) into the skin daily. 06/24/19  Yes Hawks, Christy A, FNP  Insulin Pen Needle (B-D UF III MINI PEN NEEDLES) 31G X 5 MM MISC USE WITH TRESIBA daily Dx E11.69 Patient taking differently: Inject 1 each into the skin daily. USE WITH TRESIBA daily Dx E11.69 09/03/19  Yes Evelina Dun A, FNP  metFORMIN (GLUCOPHAGE XR) 750 MG 24 hr tablet Take 2 tablets (1,500 mg total) by mouth daily with breakfast. 09/03/19  Yes Hawks, Christy A, FNP  spironolactone (ALDACTONE) 50 MG tablet Take 1 tablet (50 mg total) by mouth daily. 12/15/19  Yes Hawks, Christy A, FNP    VITAMIN D PO Take 1 capsule by mouth daily.    Yes [provider]    Allergies    Patient has no known allergies.  Review of Systems   Review of Systems  Constitutional: Negative for appetite change and fatigue.  HENT: Negative for congestion, ear discharge and sinus pressure.   Eyes: Negative for discharge.  Respiratory: Negative for cough.   Cardiovascular: Negative for chest pain.  Gastrointestinal: Positive for hematochezia. Negative for abdominal pain and diarrhea.       Rectal bleeding  Genitourinary: Negative for frequency and hematuria.  Musculoskeletal: Negative for back pain.  Skin: Negative for rash.  Neurological: Negative for seizures and headaches.  Psychiatric/Behavioral: Negative for hallucinations.    Physical Exam Updated Vital Signs BP 111/62   Pulse 67   Temp 98 F (36.7 C) (Oral)   Resp 18   Ht '5\' 8"'  (1.727 m)   Wt 81.6  kg   SpO2 100%   BMI 27.37 kg/m   Physical Exam Vitals and nursing note reviewed.  Constitutional:      Appearance: He is well-developed.  HENT:     Head: Normocephalic.     Nose: Nose normal.  Eyes:     General: No scleral icterus.    Conjunctiva/sclera: Conjunctivae normal.  Neck:     Thyroid: No thyromegaly.  Cardiovascular:     Rate and Rhythm: Normal rate and regular rhythm.     Heart sounds: No murmur. No friction rub. No gallop.   Pulmonary:     Breath sounds: No stridor. No wheezing or rales.  Chest:     Chest wall: No tenderness.  Abdominal:     General: There is no distension.     Tenderness: There is no abdominal tenderness. There is no rebound.  Genitourinary:    Comments: Heme positive dark brown stool Musculoskeletal:        General: Normal range of motion.     Cervical back: Neck supple.  Lymphadenopathy:     Cervical: No cervical adenopathy.  Skin:    Findings: No erythema or rash.  Neurological:     Mental Status: He is alert and oriented to person, place, and time.     Motor: No  abnormal muscle tone.     Coordination: Coordination normal.  Psychiatric:        Behavior: Behavior normal.     ED Results / Procedures / Treatments   Labs (all labs ordered are listed, but only abnormal results are displayed) Labs Reviewed  COMPREHENSIVE METABOLIC PANEL - Abnormal; Notable for the following components:      Result Value   Glucose, Bld 233 (*)    All other components within normal limits  CBC - Abnormal; Notable for the following components:   RBC 3.93 (*)    Hemoglobin 12.4 (*)    HCT 37.6 (*)    All other components within normal limits  PROTIME-INR  DIFFERENTIAL  POC OCCULT BLOOD, ED  TYPE AND SCREEN    EKG None  Radiology CT ABDOMEN PELVIS W CONTRAST  Result Date: 12/22/2019 CLINICAL DATA:  Acute generalized abdominal pain. EXAM: CT ABDOMEN AND PELVIS WITH CONTRAST TECHNIQUE: Multidetector CT imaging of the abdomen and pelvis was performed using the standard protocol following bolus administration of intravenous contrast. CONTRAST:  145m OMNIPAQUE IOHEXOL 300 MG/ML  SOLN COMPARISON:  April 17, 2018. FINDINGS: Lower chest: Minimal right basilar subsegmental atelectasis is noted. Hepatobiliary: Status post cholecystectomy. No biliary dilatation is noted. No focal hepatic abnormality is noted. Possibly minimally nodular hepatic contours are noted suggesting possible hepatic cirrhosis. Pancreas: Unremarkable. No pancreatic ductal dilatation or surrounding inflammatory changes. Spleen: Normal in size without focal abnormality. Adrenals/Urinary Tract: Adrenal glands are unremarkable. Kidneys are normal, without renal calculi, focal lesion, or hydronephrosis. Bladder is unremarkable. Stomach/Bowel: Stomach is within normal limits. Appendix appears normal. No evidence of bowel wall thickening, distention, or inflammatory changes. Vascular/Lymphatic: Aortic atherosclerosis. No enlarged abdominal or pelvic lymph nodes. Reproductive: Prostate is unremarkable. Other: No  abdominal wall hernia or abnormality. No abdominopelvic ascites. Musculoskeletal: No acute or significant osseous findings. IMPRESSION: 1. Possibly minimally nodular hepatic contours are noted suggesting hepatic cirrhosis. 2. No other abnormality seen in the abdomen or pelvis. Aortic Atherosclerosis (ICD10-I70.0). Electronically Signed   By: JMarijo ConceptionM.D.   On: 12/22/2019 11:18    Procedures Procedures (including critical care time)  Medications Ordered in ED Medications  sodium chloride 0.9 %  bolus 500 mL (0 mLs Intravenous Stopped 12/22/19 1020)  iohexol (OMNIPAQUE) 300 MG/ML solution 100 mL (100 mLs Intravenous Contrast Given 12/22/19 1031)    ED Course  I have reviewed the triage vital signs and the nursing notes.  Pertinent labs & imaging results that were available during my care of the patient were reviewed by me and considered in my medical decision making (see chart for details).    MDM Rules/Calculators/A&P                      Patient with rectal bleeding that is persistent.  Will consult GI     This patient presents to the ED for concern of rectal bleeding this involves an extensive number of treatment options, and is a complaint that carries with it a high risk of complications and morbidity.  The differential diagnosis includes diverticula bleed,  ca   Lab Tests:   I Ordered, reviewed, and interpreted labs, which included cbc chem,  Result show anemia  Medicines ordered:   I ordered medication normal saline  Imaging Studies ordered:   I ordered imaging studies which included ct abd and  I independently visualized and interpreted imaging which showed cirrhosis  Additional history obtained:   Additional history obtained from records  Previous records obtained and reviewed   Consultations Obtained:  Reevaluation:  After the interventions stated above, I reevaluated the patient and found improved  Critical Interventions:  .   Final Clinical  Impression(s) / ED Diagnoses Final diagnoses:  None    Rx / DC Orders ED Discharge Orders    None       Milton Ferguson, MD 12/27/19 1248

## 2019-12-23 ENCOUNTER — Encounter (HOSPITAL_COMMUNITY)
Admission: EM | Disposition: A | Discharge status: Home / Self Care | Payer: Self-pay | Source: Home / Self Care | Attending: Emergency Medicine

## 2019-12-23 ENCOUNTER — Inpatient Hospital Stay (HOSPITAL_COMMUNITY): Payer: Medicare Other | Admitting: Anesthesiology

## 2019-12-23 ENCOUNTER — Telehealth: Payer: Self-pay | Admitting: Gastroenterology

## 2019-12-23 ENCOUNTER — Other Ambulatory Visit: Payer: Self-pay | Admitting: *Deleted

## 2019-12-23 ENCOUNTER — Encounter (HOSPITAL_COMMUNITY): Payer: Self-pay | Admitting: Internal Medicine

## 2019-12-23 ENCOUNTER — Other Ambulatory Visit: Payer: Self-pay

## 2019-12-23 DIAGNOSIS — I10 Essential (primary) hypertension: Secondary | ICD-10-CM | POA: Diagnosis not present

## 2019-12-23 DIAGNOSIS — Z20822 Contact with and (suspected) exposure to covid-19: Secondary | ICD-10-CM | POA: Diagnosis not present

## 2019-12-23 DIAGNOSIS — K766 Portal hypertension: Secondary | ICD-10-CM | POA: Diagnosis not present

## 2019-12-23 DIAGNOSIS — K3189 Other diseases of stomach and duodenum: Secondary | ICD-10-CM | POA: Diagnosis not present

## 2019-12-23 DIAGNOSIS — K219 Gastro-esophageal reflux disease without esophagitis: Secondary | ICD-10-CM | POA: Diagnosis not present

## 2019-12-23 DIAGNOSIS — K921 Melena: Secondary | ICD-10-CM | POA: Diagnosis not present

## 2019-12-23 DIAGNOSIS — E1169 Type 2 diabetes mellitus with other specified complication: Secondary | ICD-10-CM | POA: Diagnosis not present

## 2019-12-23 DIAGNOSIS — K922 Gastrointestinal hemorrhage, unspecified: Secondary | ICD-10-CM | POA: Diagnosis present

## 2019-12-23 DIAGNOSIS — E119 Type 2 diabetes mellitus without complications: Secondary | ICD-10-CM | POA: Diagnosis not present

## 2019-12-23 DIAGNOSIS — K7030 Alcoholic cirrhosis of liver without ascites: Secondary | ICD-10-CM | POA: Diagnosis not present

## 2019-12-23 DIAGNOSIS — F320 Major depressive disorder, single episode, mild: Secondary | ICD-10-CM

## 2019-12-23 DIAGNOSIS — K7460 Unspecified cirrhosis of liver: Secondary | ICD-10-CM | POA: Diagnosis not present

## 2019-12-23 HISTORY — PX: ESOPHAGOGASTRODUODENOSCOPY (EGD) WITH PROPOFOL: SHX5813

## 2019-12-23 LAB — HIV ANTIBODY (ROUTINE TESTING W REFLEX): HIV Screen 4th Generation wRfx: NONREACTIVE

## 2019-12-23 LAB — GLUCOSE, CAPILLARY
Glucose-Capillary: 148 mg/dL — ABNORMAL HIGH (ref 70–99)
Glucose-Capillary: 169 mg/dL — ABNORMAL HIGH (ref 70–99)
Glucose-Capillary: 231 mg/dL — ABNORMAL HIGH (ref 70–99)
Glucose-Capillary: 227 mg/dL — ABNORMAL HIGH (ref 70–99)

## 2019-12-23 LAB — CBC
HCT: 37.9 % — ABNORMAL LOW (ref 39.0–52.0)
HCT: 39.4 % (ref 39.0–52.0)
Hemoglobin: 12.6 g/dL — ABNORMAL LOW (ref 13.0–17.0)
Hemoglobin: 12.9 g/dL — ABNORMAL LOW (ref 13.0–17.0)
MCH: 31.9 pg (ref 26.0–34.0)
MCH: 32.1 pg (ref 26.0–34.0)
MCHC: 33.2 g/dL (ref 30.0–36.0)
MCHC: 32.7 g/dL (ref 30.0–36.0)
MCV: 95.9 fL (ref 80.0–100.0)
MCV: 98 fL (ref 80.0–100.0)
Platelets: 161 10*3/uL (ref 150–400)
Platelets: DECREASED 10*3/uL (ref 150–400)
RBC: 3.95 MIL/uL — ABNORMAL LOW (ref 4.22–5.81)
RBC: 4.02 MIL/uL — ABNORMAL LOW (ref 4.22–5.81)
RDW: 12.6 % (ref 11.5–15.5)
RDW: 12.5 % (ref 11.5–15.5)
WBC: 5 10*3/uL (ref 4.0–10.5)
WBC: 3.8 10*3/uL — ABNORMAL LOW (ref 4.0–10.5)
nRBC: 0 % (ref 0.0–0.2)
nRBC: 0 % (ref 0.0–0.2)

## 2019-12-23 LAB — MRSA PCR SCREENING: MRSA by PCR: NEGATIVE

## 2019-12-23 LAB — BASIC METABOLIC PANEL
Anion gap: 10 (ref 5–15)
BUN: 14 mg/dL (ref 6–20)
CO2: 24 mmol/L (ref 22–32)
Calcium: 8.8 mg/dL — ABNORMAL LOW (ref 8.9–10.3)
Chloride: 105 mmol/L (ref 98–111)
Creatinine, Ser: 0.75 mg/dL (ref 0.61–1.24)
GFR calc Af Amer: >60 mL/min (ref >60)
GFR calc non Af Amer: >60 mL/min (ref >60)
Glucose, Bld: 159 mg/dL — ABNORMAL HIGH (ref 70–99)
Potassium: 4 mmol/L (ref 3.5–5.1)
Sodium: 139 mmol/L (ref 135–145)

## 2019-12-23 LAB — HEMOGLOBIN A1C
Hgb A1c MFr Bld: 8.6 % — ABNORMAL HIGH (ref 4.8–5.6)
Mean Plasma Glucose: 200.12 mg/dL

## 2019-12-23 SURGERY — ESOPHAGOGASTRODUODENOSCOPY (EGD) WITH PROPOFOL
Anesthesia: General

## 2019-12-23 MED ORDER — DEXILANT 60 MG PO CPDR: 1.0000 | DELAYED_RELEASE_CAPSULE | Freq: Every day | ORAL | 3 refills | Status: DC

## 2019-12-23 MED ORDER — LACTATED RINGERS IV SOLN
Freq: Once | INTRAVENOUS | Status: AC
  Administered 2019-12-23: 1000 mL via INTRAVENOUS

## 2019-12-23 MED ORDER — LIDOCAINE VISCOUS HCL 2 % MT SOLN
15.0000 mL | Freq: Once | OROMUCOSAL | Status: AC
  Administered 2019-12-23: 15 mL via OROMUCOSAL

## 2019-12-23 MED ORDER — PANTOPRAZOLE SODIUM 40 MG IV SOLR
INTRAVENOUS | Status: AC
  Filled 2019-12-23: qty 80

## 2019-12-23 MED ORDER — LIDOCAINE 2% (20 MG/ML) 5 ML SYRINGE
INTRAMUSCULAR | Status: AC
  Filled 2019-12-23: qty 5

## 2019-12-23 MED ORDER — SPIRONOLACTONE 50 MG PO TABS: 50.0000 mg | ORAL_TABLET | Freq: Every day | ORAL | 0 refills | Status: DC

## 2019-12-23 MED ORDER — KETAMINE HCL 50 MG/5ML IJ SOSY
PREFILLED_SYRINGE | INTRAMUSCULAR | Status: AC
  Filled 2019-12-23: qty 5

## 2019-12-23 MED ORDER — NADOLOL 20 MG PO TABS: 20.0000 mg | ORAL_TABLET | Freq: Every day | ORAL | 3 refills | Status: DC

## 2019-12-23 MED ORDER — CHLORHEXIDINE GLUCONATE CLOTH 2 % EX PADS
6.0000 | MEDICATED_PAD | Freq: Every day | CUTANEOUS | Status: DC
  Administered 2019-12-23: 6 via TOPICAL

## 2019-12-23 MED ORDER — GLYCOPYRROLATE 0.2 MG/ML IJ SOLN
0.2000 mg | Freq: Once | INTRAMUSCULAR | Status: AC
  Administered 2019-12-23: 0.2 mg via INTRAVENOUS

## 2019-12-23 MED ORDER — ONDANSETRON HCL 4 MG PO TABS: 4.0000 mg | ORAL_TABLET | Freq: Four times a day (QID) | ORAL | 0 refills | Status: DC | PRN

## 2019-12-23 MED ORDER — GLYCOPYRROLATE 0.2 MG/ML IJ SOLN
INTRAMUSCULAR | Status: AC
  Filled 2019-12-23: qty 1

## 2019-12-23 MED ORDER — LIDOCAINE HCL (CARDIAC) PF 100 MG/5ML IV SOSY
PREFILLED_SYRINGE | INTRAVENOUS | Status: DC | PRN
Start: 2019-12-23 — End: 2019-12-23
  Administered 2019-12-23: 100 mg via INTRAVENOUS

## 2019-12-23 MED ORDER — LIDOCAINE VISCOUS HCL 2 % MT SOLN
OROMUCOSAL | Status: AC
  Filled 2019-12-23: qty 15

## 2019-12-23 MED ORDER — KETAMINE HCL 10 MG/ML IJ SOLN
INTRAMUSCULAR | Status: DC | PRN
  Administered 2019-12-23: 20 mg via INTRAVENOUS

## 2019-12-23 MED ORDER — LACTATED RINGERS IV SOLN: INTRAVENOUS | Status: DC | PRN

## 2019-12-23 MED ORDER — ESCITALOPRAM OXALATE 10 MG PO TABS: 10.0000 mg | ORAL_TABLET | Freq: Every day | ORAL | 0 refills | Status: DC

## 2019-12-23 MED ORDER — PROPOFOL 10 MG/ML IV BOLUS
INTRAVENOUS | Status: DC | PRN
  Administered 2019-12-23: 50 mg via INTRAVENOUS
  Administered 2019-12-23: 100 mg via INTRAVENOUS
  Administered 2019-12-23: 50 mg via INTRAVENOUS

## 2019-12-23 MED ORDER — SODIUM CHLORIDE FLUSH 0.9 % IV SOLN
INTRAVENOUS | Status: AC
  Filled 2019-12-23: qty 10

## 2019-12-23 MED ORDER — PANTOPRAZOLE SODIUM 40 MG PO TBEC
40.0000 mg | DELAYED_RELEASE_TABLET | Freq: Every day | ORAL | Status: DC
  Administered 2019-12-23: 40 mg via ORAL
  Filled 2019-12-23 (×2): qty 1

## 2019-12-23 MED ORDER — NADOLOL 20 MG PO TABS
20.0000 mg | ORAL_TABLET | Freq: Every day | ORAL | Status: DC
  Administered 2019-12-23: 20 mg via ORAL
  Filled 2019-12-23 (×2): qty 1

## 2019-12-23 MED ORDER — OCTREOTIDE ACETATE 500 MCG/ML IJ SOLN
INTRAMUSCULAR | Status: AC
  Filled 2019-12-23: qty 1

## 2019-12-23 NOTE — Care Management Obs Status (Signed)
[facility] NOTIFICATION   Patient Details  Name: Thomas Pratt MRN: [id number] Date of Birth: [birth year]   Medicare Observation Status Notification Given:  Yes    Sherie Don, LCSW 12/23/2019, 5:05 PM

## 2019-12-23 NOTE — Progress Notes (Signed)
Blood sugar for 1600 was 277.

## 2019-12-23 NOTE — Op Note (Addendum)
[facility] Patient Name: Thomas Pratt Procedure Date: 12/23/2019 10:55 AM MRN: [id number] Date of Birth: Jan 02, 1961 Attending MD: Norvel Richards , MD CSN: [id number] Age: 59 Admit Type: Inpatient Procedure:                Upper GI endoscopy Indications:              Hematochezia Providers:                Norvel Richards, MD, Jeanann Lewandowsky. Sharon Seller, RN,                            Randa Spike, Technician Referring MD:              Medicines:                Propofol per Anesthesia Complications:            No immediate complications. Estimated Blood Loss:     Estimated blood loss: none. Procedure:                After obtaining informed consent, the endoscope was                            passed under direct vision. Throughout the                            procedure, the patient's blood pressure, pulse, and                            oxygen saturations were monitored continuously. The                            [id number] scope was introduced through the                            mouth, and advanced to the second part of duodenum. Scope In: 11:38:20 AM Scope Out: 11:45:01 AM Total Procedure Duration: 0 hours 6 minutes 41 seconds  Findings:      2 short columns of barely grade 2 esophageal varices distal esophagus.       No bleeding stigmata. Overlying mucosa appeared normal. Stomach empty.       Mild changes of portal gastropathy. No gastric varices ulcer or       infiltrating process. Patent pylorus. Normal-appearing first and second       portion of the duodenum. No blood in the upper GI tract. Impression:               Innocent appearing of barely grade 2 esophageal                            varices. Mild portal gastropathy. No evidence of                            bleeding from the upper GI tract. Minimal drop in                            hemoglobin since yesterday. I suspect trivial  rectal bleeding likely from a benign  anorectal                            source given colonoscopy not too long ago. Moderate Sedation:      Moderate (conscious) sedation was personally administered by an       anesthesia professional. The following parameters were monitored: oxygen       saturation, heart rate, blood pressure, respiratory rate, EKG, adequacy       of pulmonary ventilation, and response to care. Recommendation:           - -Clear liquid diet. No further inpatient GI                            evaluation. Should go on a nonselective                            beta-blocker as primary prophylaxis against                            variceal hemorrhage. Further GI evaluation would be                            warranted if recurrent bleeding                           - Return patient to hospital ward for observation.                            Discontinue PPI infusion. Discontinue octreotide.                            Once daily PPI. Procedure Code(s):        --- Professional ---                           [phone], Esophagogastroduodenoscopy, flexible,                            transoral; diagnostic, including collection of                            specimen(s) by brushing or washing, when performed                            (separate procedure) Diagnosis Code(s):        --- Professional ---                           K92.1, Melena (includes Hematochezia) CPT copyright 2019 American Medical Association. All rights reserved. The codes documented in this report are preliminary and upon coder review may  be revised to meet current compliance requirements. Cristopher Estimable. Damany Eastman, MD Norvel Richards, MD 12/23/2019 11:58:56 AM This report has been signed electronically. Number of Addenda: 0

## 2019-12-23 NOTE — Interval H&P Note (Signed)
History and Physical Interval Note:  12/23/2019 11:26 AM  Thomas Pratt  has presented today for surgery, with the diagnosis of Acute GI bleed in patient with known cirrhosis and esophageal varices..  The various methods of treatment have been discussed with the patient and family. After consideration of risks, benefits and other options for treatment, the patient has consented to  Procedure(s): ESOPHAGOGASTRODUODENOSCOPY (EGD) WITH PROPOFOL Possible esophageal variceal banding. (N/A) as a surgical intervention.  The patient's history has been reviewed, patient examined, no change in status, stable for surgery.  I have reviewed the patient's chart and labs.  Questions were answered to the patient's satisfaction.     Manus Rudd  Patient remained stable.  Examined in short stay.  Agree with need for diagnostic EGD with therapeutic intervention as feasible/appropriate per plan.  The risks, benefits, limitations, alternatives and imponderables have been reviewed with the patient. Potential for esophageal dilation, biopsy, etc. have also been reviewed.  Questions have been answered. All parties agreeable.

## 2019-12-23 NOTE — Telephone Encounter (Signed)
Patient needs CBC first of the week for dx: rectal bleeding. Currently inpatient but possible d/c today or tomorrow.   Please have EG, KH, AB follow up on result as I will not be in the office next week.

## 2019-12-23 NOTE — Discharge Instructions (Signed)
1)Avoid ibuprofen/Advil/Aleve/Motrin/Goody Powders/Naproxen/BC powders/Meloxicam/Diclofenac/Indomethacin and other Nonsteroidal anti-inflammatory medications as these will make you more likely to bleed and can cause stomach ulcers, can also cause Kidney problems.   2) follow-up with your primary care physician in 1 week for repeat CBC and BMP blood test  3) follow-up with gastroenterologist Dr. Gala Romney at [facility]

## 2019-12-23 NOTE — Discharge Summary (Signed)
Thomas Pratt, is a 59 y.o. male  DOB [birth year]  MRN [id number].  Admission date:  12/22/2019  Admitting Physician  Deaunna Olarte Denton Brick, MD  Discharge Date:  12/23/2019   Primary MD  Sharion Balloon, FNP  Recommendations for primary care physician for things to follow:   1)Avoid ibuprofen/Advil/Aleve/Motrin/Goody Powders/Naproxen/BC powders/Meloxicam/Diclofenac/Indomethacin and other Nonsteroidal anti-inflammatory medications as these will make you more likely to bleed and can cause stomach ulcers, can also cause Kidney problems.   2) follow-up with your primary care physician in 1 week for repeat CBC and BMP blood test  3) follow-up with gastroenterologist Dr. Gala Romney at [facility]   Admission Diagnosis  GI bleed [K92.2] Acute GI bleeding [K92.2]   Discharge Diagnosis  GI bleed [K92.2] Acute GI bleeding [K92.2]    Principal Problem:   GI bleed Active Problems:   HTN (hypertension)   ALC (alcoholic liver cirrhosis) ([place])   Type 2 diabetes mellitus with other specified complication (HCC)   Acute GI bleeding      Past Medical History:  Diagnosis Date  . Cirrhosis ([place])    likely ETOH use   . Diabetes ([place])   . Heat stroke 2016/17    Past Surgical History:  Procedure Laterality Date  . CHOLECYSTECTOMY N/A 09/01/2017   Procedure: LAPAROSCOPIC CHOLECYSTECTOMY;  Surgeon: Aviva Signs, MD;  Location: [facility];  Service: General;  Laterality: N/A;  . COLONOSCOPY WITH PROPOFOL N/A 03/06/2017   Dr. Gala Romney: tubular adenoma removed, next TCS 5 years if health permits.   . ESOPHAGOGASTRODUODENOSCOPY (EGD) WITH PROPOFOL N/A 03/06/2017   Dr. Gala Romney: grade 1 esophageal varices, erosive esophagitis, portal gastropathy.   . ESOPHAGOGASTRODUODENOSCOPY (EGD) WITH PROPOFOL N/A 05/03/2019   Procedure: ESOPHAGOGASTRODUODENOSCOPY (EGD) WITH PROPOFOL;  Surgeon: Daneil Dolin, MD;   Location: [facility];  Service: Endoscopy;  Laterality: N/A;  10:30am  . HERNIA REPAIR Right    RIH  . INGUINAL HERNIA REPAIR Right 07/11/2017   Procedure: HERNIA REPAIR INGUINAL ADULT WITH MESH;  Surgeon: Aviva Signs, MD;  Location: [facility];  Service: General;  Laterality: Right;  patient knows to arrive at 7:30  . IR TRANSCATHETER BX  10/14/2017  . IR US GUIDE VASC ACCESS RIGHT  10/14/2017  . IR VENOGRAM HEPATIC WO HEMODYNAMIC EVALUATION  10/14/2017  . MALONEY DILATION N/A 05/03/2019   Procedure: Venia Minks DILATION;  Surgeon: Daneil Dolin, MD;  Location: [facility];  Service: Endoscopy;  Laterality: N/A;  . POLYPECTOMY  03/06/2017   Procedure: POLYPECTOMY;  Surgeon: Daneil Dolin, MD;  Location: [facility];  Service: Endoscopy;;  ascending colon;     HPI  from the history and physical done on the day of admission:    Chief Complaint: Bloody stools  HPI: Thomas Pratt is a 59 y.o. male with medical history significant for alcoholic liver cirrhosis, esophageal varices, CVA, hypertension, diabetes mellitus. Patient presented to the ED with complaints of blood in stools.  Has had 4 episodes today including here in the ED.  Reports some diffuse abdominal discomfort.  He denies vomiting.  No dizziness.  He reports his last drink of alcohol was 4 years ago.  Reports compliance with his medications which include PPI, but ran out of his spironolactone about a week ago.  ED Course: Blood pressure systolic 283-662, hemoglobin 12.4, baseline 13-15, platelets 155, BUN 14..  Abdominal/pelvic CT with contrast suggests hepatic cirrhosis without other abnormality.  Started on octreotide infusion, GI was consulted.  Review of Systems: As per HPI all other systems reviewed and negative.    Hospital Course:   Acute GI bleed- bloody stools, hemoglobin still above 12,  Baseline hemoglobin usually above 13 blood pressure soft to stable.  Reports abdominal pain, but abdominal CT pelvis  with contrast without acute abnormality. -- History of grade 2 esophageal varices.  INR 1.1, platelets 155. -Treated with  IV octreotide, IV Protonix bolus and infusion --Gastroenterology consulted,  EGD on 12/23/19 - with Innocent appearing of barely grade 2 esophageal varices. Mild portal gastropathy. No evidence of bleeding from the upper GI tract   Alcoholic liver cirrhosis- quit drinking alcohol 4 years ago. -resume spironolactone f   Diabetes mellitus- Resume Tresiba 15 units daily resume Metformin  Hypertension- resume spironolactone for now  Discharge Condition: stable  Follow UP----with PCP and GI as outpatient  Consults obtained - Gi  Diet and Activity recommendation:  As advised  Discharge Instructions    Discharge Instructions    Call MD for:  difficulty breathing, headache or visual disturbances   Complete by: As directed    Call MD for:  persistant dizziness or light-headedness   Complete by: As directed    Call MD for:  persistant nausea and vomiting   Complete by: As directed    Call MD for:  severe uncontrolled pain   Complete by: As directed    Call MD for:  temperature >100.4   Complete by: As directed    Diet - low sodium heart healthy   Complete by: As directed    Diet Carb Modified   Complete by: As directed    Discharge instructions   Complete by: As directed    1)Avoid ibuprofen/Advil/Aleve/Motrin/Goody Powders/Naproxen/BC powders/Meloxicam/Diclofenac/Indomethacin and other Nonsteroidal anti-inflammatory medications as these will make you more likely to bleed and can cause stomach ulcers, can also cause Kidney problems.   2) follow-up with your primary care physician in 1 week for repeat CBC and BMP blood test  3) follow-up with gastroenterologist Dr. Gala Romney at [facility]   Increase activity slowly   Complete by: As directed        Discharge Medications     Allergies as of 12/23/2019   No Known Allergies       Medication List    TAKE these medications   Accu-Chek Guide test strip Generic drug: glucose blood Test BS 4 (four) times daily. Dx E11.69 What changed:   how much to take  how to take this  when to take this  reasons to take this   Accu-Chek Guide w/Device Kit Test BS 4 (four) times daily. Dx E11.69   Accu-Chek Softclix Lancets lancets Test BS 4 (four) times daily. Dx E11.69 What changed:   how much to take  how to take this  when to take this  reasons to take this   atorvastatin 10 MG tablet Commonly known as: LIPITOR TAKE 1 TABLET BY MOUTH  DAILY What changed: how much to take   B-D UF III MINI PEN NEEDLES 31G X 5 MM Misc Generic drug: Insulin  Pen Needle USE WITH TRESIBA daily Dx E11.69 What changed:   how much to take  how to take this  when to take this   blood glucose meter kit and supplies Dispense based on patient and insurance preference. Use up to four times daily as directed. (FOR ICD-10 E10.9, E11.9). What changed:   how much to take  how to take this  when to take this   cetirizine 10 MG tablet Commonly known as: ZYRTEC Take 1 tablet (10 mg total) by mouth daily.   Dexilant 60 MG capsule Generic drug: dexlansoprazole Take 1 capsule (60 mg total) by mouth daily.   escitalopram 10 MG tablet Commonly known as: Lexapro Take 1 tablet (10 mg total) by mouth daily.   fluticasone 50 MCG/ACT nasal spray Commonly known as: FLONASE Place 2 sprays into both nostrils daily.   metFORMIN 750 MG 24 hr tablet Commonly known as: Glucophage XR Take 2 tablets (1,500 mg total) by mouth daily with breakfast.   nadolol 20 MG tablet Commonly known as: CORGARD Take 1 tablet (20 mg total) by mouth daily. Start taking on: December 24, 2019   ondansetron 4 MG tablet Commonly known as: ZOFRAN Take 1 tablet (4 mg total) by mouth every 6 (six) hours as needed for nausea.   spironolactone 50 MG tablet Commonly known as: ALDACTONE Take 1 tablet (50 mg  total) by mouth daily.   Tyler Aas FlexTouch 100 UNIT/ML FlexTouch Pen Generic drug: insulin degludec Inject 0.15 mLs (15 Units total) into the skin daily.   VITAMIN D PO Take 1 capsule by mouth daily.       Major procedures and Radiology Reports - PLEASE review detailed and final reports for all details, in brief -  CT ABDOMEN PELVIS W CONTRAST  Result Date: 12/22/2019 CLINICAL DATA:  Acute generalized abdominal pain. EXAM: CT ABDOMEN AND PELVIS WITH CONTRAST TECHNIQUE: Multidetector CT imaging of the abdomen and pelvis was performed using the standard protocol following bolus administration of intravenous contrast. CONTRAST:  137m OMNIPAQUE IOHEXOL 300 MG/ML  SOLN COMPARISON:  April 17, 2018. FINDINGS: Lower chest: Minimal right basilar subsegmental atelectasis is noted. Hepatobiliary: Status post cholecystectomy. No biliary dilatation is noted. No focal hepatic abnormality is noted. Possibly minimally nodular hepatic contours are noted suggesting possible hepatic cirrhosis. Pancreas: Unremarkable. No pancreatic ductal dilatation or surrounding inflammatory changes. Spleen: Normal in size without focal abnormality. Adrenals/Urinary Tract: Adrenal glands are unremarkable. Kidneys are normal, without renal calculi, focal lesion, or hydronephrosis. Bladder is unremarkable. Stomach/Bowel: Stomach is within normal limits. Appendix appears normal. No evidence of bowel wall thickening, distention, or inflammatory changes. Vascular/Lymphatic: Aortic atherosclerosis. No enlarged abdominal or pelvic lymph nodes. Reproductive: Prostate is unremarkable. Other: No abdominal wall hernia or abnormality. No abdominopelvic ascites. Musculoskeletal: No acute or significant osseous findings. IMPRESSION: 1. Possibly minimally nodular hepatic contours are noted suggesting hepatic cirrhosis. 2. No other abnormality seen in the abdomen or pelvis. Aortic Atherosclerosis (ICD10-I70.0). Electronically Signed   By: JMarijo ConceptionM.D.   On: 12/22/2019 11:18    Micro Results   Recent Results (from the past 240 hour(s))  SARS Coronavirus 2 by RT PCR (hospital order[facility]) Nasopharyngeal Nasopharyngeal Swab     Status: None   Collection Time: 12/22/19  5:55 PM   Specimen: Nasopharyngeal Swab  Result Value Ref Range Status   SARS Coronavirus 2 NEGATIVE NEGATIVE Final    Comment: (NOTE) SARS-CoV-2 target nucleic acids are NOT DETECTED. The SARS-CoV-2 RNA is  generally detectable in upper and lower respiratory specimens during the acute phase of infection. The lowest concentration of SARS-CoV-2 viral copies this assay can detect is 250 copies / mL. A negative result does not preclude SARS-CoV-2 infection and should not be used as the sole basis for treatment or other patient management decisions.  A negative result may occur with improper specimen collection / handling, submission of specimen other than nasopharyngeal swab, presence of viral mutation(s) within the areas targeted by this assay, and inadequate number of viral copies (<250 copies / mL). A negative result must be combined with clinical observations, patient history, and epidemiological information. Fact Sheet for Patients:   StrictlyIdeas.no Fact Sheet for Healthcare Providers: BankingDealers.co.za This test is not yet approved or cleared  by the [place] FDA and has been authorized for detection and/or diagnosis of SARS-CoV-2 by FDA under an Emergency Use Authorization (EUA).  This EUA will remain in effect (meaning this test can be used) for the duration of the COVID-19 declaration under Section 564(b)(1) of the Act, 21 U.S.C. section 360bbb-3(b)(1), unless the authorization is terminated or revoked sooner. Performed at [facility], [address] [place], [place] [address]   MRSA PCR Screening     Status: None   Collection Time: 12/22/19 11:15 PM    Specimen: Nasal Mucosa; Nasopharyngeal  Result Value Ref Range Status   MRSA by PCR NEGATIVE NEGATIVE Final    Comment:        The GeneXpert MRSA Assay (FDA approved for NASAL specimens only), is one component of a comprehensive MRSA colonization surveillance program. It is not intended to diagnose MRSA infection nor to guide or monitor treatment for MRSA infections. Performed at [facility], [address], [place], [place] [address]        Today   Subjective    Thomas Pratt today has no new concerns No nausea, vomiting, diarrhea    -eating and drinking well       Patient has been seen and examined prior to discharge   Objective   Blood pressure (!) 131/57, pulse 77, temperature (!) 97.4 F (36.3 C), temperature source Oral, resp. rate 14, height '5\' 8"'  (1.727 m), weight 84 kg, SpO2 97 %.   Intake/Output Summary (Last 24 hours) at 12/23/2019 1814 Last data filed at 12/23/2019 1509 Gross per 24 hour  Intake 2094.89 ml  Output 280 ml  Net 1814.89 ml    Exam Gen:- Awake Alert, no acute distress  HEENT:- [place].AT, No sclera icterus Neck-Supple Neck,No JVD,.  Lungs-  CTAB , good air movement bilaterally  CV- S1, S2 normal, regular Abd-  +ve B.Sounds, Abd Soft, No tenderness,    Extremity/Skin:- No  edema,   good pulses Psych-affect is appropriate, oriented x3 Neuro-no new focal deficits, no tremors    Data Review   CBC w Diff:  Lab Results  Component Value Date   WBC 3.8 (L) 12/23/2019   HGB 12.9 (L) 12/23/2019   HGB 15.8 06/24/2019   HCT 39.4 12/23/2019   HCT 44.5 06/24/2019   PLT  12/23/2019    PLATELET CLUMPS NOTED ON SMEAR, COUNT APPEARS DECREASED   PLT 180 06/24/2019   LYMPHOPCT 33 12/22/2019   MONOPCT 10 12/22/2019   EOSPCT 2 12/22/2019   BASOPCT 1 12/22/2019    CMP:  Lab Results  Component Value Date   NA 139 12/23/2019   NA 136 09/03/2019   K 4.0 12/23/2019   CL 105 12/23/2019   CO2 24 12/23/2019  BUN 14 12/23/2019   BUN 11  09/03/2019   CREATININE 0.75 12/23/2019   CREATININE 0.89 05/18/2018   PROT 7.2 12/22/2019   PROT 7.8 06/24/2019   ALBUMIN 3.9 12/22/2019   ALBUMIN 4.7 06/24/2019   BILITOT 0.8 12/22/2019   BILITOT 0.8 06/24/2019   ALKPHOS 56 12/22/2019   AST 26 12/22/2019   ALT 28 12/22/2019    Total Discharge time is about 33 minutes  Roxan Hockey M.D on 12/23/2019 at 6:14 PM  Go to [email or web address] -  for contact info  [facility] - Office  [phone]

## 2019-12-23 NOTE — Progress Notes (Signed)
Report given to [place], [place] on 300. Pt to be transported to room 317 via WC.

## 2019-12-23 NOTE — Progress Notes (Signed)
Endo staff present to transport patient down for procedure.

## 2019-12-23 NOTE — Anesthesia Postprocedure Evaluation (Signed)
Anesthesia Post Note  Patient: Ehan Cosner  Procedure(s) Performed: ESOPHAGOGASTRODUODENOSCOPY (EGD) WITH PROPOFOL Possible esophageal variceal banding. (N/A )  Patient location during evaluation: PACU Anesthesia Type: General Level of consciousness: awake, oriented and awake and alert Pain management: pain level controlled Vital Signs Assessment: post-procedure vital signs reviewed and stable Respiratory status: spontaneous breathing, nonlabored ventilation and respiratory function stable Cardiovascular status: blood pressure returned to baseline and stable Postop Assessment: no headache and no backache Anesthetic complications: no     Last Vitals:  Vitals:   12/23/19 1000 12/23/19 1016  BP: 114/63 124/79  Pulse: 61 64  Resp: 13 15  Temp:  36.6 C  SpO2: 96% 98%    Last Pain:  Vitals:   12/23/19 1135  TempSrc:   PainSc: 0-No pain                 Tacy Learn

## 2019-12-23 NOTE — Progress Notes (Signed)
Nsg Discharge Note  Admit Date:  12/22/2019 Discharge date: 12/23/2019   Thomas Pratt to be D/C'd home per MD order.  AVS completed.  Copy for chart, and copy for patient signed, and dated. Patient/caregiver able to verbalize understanding.  Discharge Medication: Allergies as of 12/23/2019   No Known Allergies     Medication List    TAKE these medications   Accu-Chek Guide test strip Generic drug: glucose blood Test BS 4 (four) times daily. Dx E11.69 What changed:   how much to take  how to take this  when to take this  reasons to take this   Accu-Chek Guide w/Device Kit Test BS 4 (four) times daily. Dx E11.69   Accu-Chek Softclix Lancets lancets Test BS 4 (four) times daily. Dx E11.69 What changed:   how much to take  how to take this  when to take this  reasons to take this   atorvastatin 10 MG tablet Commonly known as: LIPITOR TAKE 1 TABLET BY MOUTH  DAILY What changed: how much to take   B-D UF III MINI PEN NEEDLES 31G X 5 MM Misc Generic drug: Insulin Pen Needle USE WITH TRESIBA daily Dx E11.69 What changed:   how much to take  how to take this  when to take this   blood glucose meter kit and supplies Dispense based on patient and insurance preference. Use up to four times daily as directed. (FOR ICD-10 E10.9, E11.9). What changed:   how much to take  how to take this  when to take this   cetirizine 10 MG tablet Commonly known as: ZYRTEC Take 1 tablet (10 mg total) by mouth daily.   Dexilant 60 MG capsule Generic drug: dexlansoprazole Take 1 capsule (60 mg total) by mouth daily.   escitalopram 10 MG tablet Commonly known as: Lexapro Take 1 tablet (10 mg total) by mouth daily.   fluticasone 50 MCG/ACT nasal spray Commonly known as: FLONASE Place 2 sprays into both nostrils daily.   metFORMIN 750 MG 24 hr tablet Commonly known as: Glucophage XR Take 2 tablets (1,500 mg total) by mouth daily with breakfast.   nadolol 20 MG  tablet Commonly known as: CORGARD Take 1 tablet (20 mg total) by mouth daily. Start taking on: December 24, 2019   ondansetron 4 MG tablet Commonly known as: ZOFRAN Take 1 tablet (4 mg total) by mouth every 6 (six) hours as needed for nausea.   spironolactone 50 MG tablet Commonly known as: ALDACTONE Take 1 tablet (50 mg total) by mouth daily.   Tyler Aas FlexTouch 100 UNIT/ML FlexTouch Pen Generic drug: insulin degludec Inject 0.15 mLs (15 Units total) into the skin daily.   VITAMIN D PO Take 1 capsule by mouth daily.       Discharge Assessment: Vitals:   12/23/19 1545 12/23/19 1600  BP: 121/72 (!) 131/57  Pulse:    Resp: (!) 22 14  Temp:    SpO2:     Skin clean, dry and intact without evidence of skin break down, no evidence of skin tears noted. IV catheter discontinued intact. Site without signs and symptoms of complications - no redness or edema noted at insertion site, patient denies c/o pain - only slight tenderness at site.  Dressing with slight pressure applied.  D/c Instructions-Education: Discharge instructions given to patient/family with verbalized understanding. D/c education completed with patient/family including follow up instructions, medication list, d/c activities limitations if indicated, with other d/c instructions as indicated by MD - patient able to  verbalize understanding, all questions fully answered. Patient instructed to return to ED, call 911, or call MD for any changes in condition.  Patient escorted via [place], and D/C home via private auto.  Carney Corners, RN 12/23/2019 6:28 PM

## 2019-12-23 NOTE — Anesthesia Preprocedure Evaluation (Addendum)
Anesthesia Evaluation  Patient identified by MRN, date of birth, ID band Patient awake    Reviewed: Allergy & Precautions, NPO status , Patient's Chart, lab work & pertinent test results  History of Anesthesia Complications Negative for: history of anesthetic complications  Airway Mallampati: II  TM Distance: >3 FB Neck ROM: Full    Dental  (+) Poor Dentition, Dental Advisory Given, Missing   Pulmonary    Pulmonary exam normal breath sounds clear to auscultation       Cardiovascular METS: 3 - Mets hypertension, Pt. on medications Normal cardiovascular exam Rhythm:Regular Rate:Normal  08-Nov-2018 12:05:12 [facility] ROUTINE RECORD Sinus rhythm Abnormal R-wave progression, early transition Left ventricular hypertrophy   Neuro/Psych PSYCHIATRIC DISORDERS Depression CVA    GI/Hepatic GERD  Medicated and Controlled,(+) Cirrhosis   Esophageal Varices  substance abuse  alcohol use,   Endo/Other  diabetes, Well Controlled, Type 2, Insulin Dependent  Renal/GU      Musculoskeletal   Abdominal   Peds  Hematology   Anesthesia Other Findings   Reproductive/Obstetrics                           Anesthesia Physical Anesthesia Plan  ASA: IV  Anesthesia Plan: General   Post-op Pain Management:    Induction: Intravenous  PONV Risk Score and Plan: 2 and TIVA and Treatment may vary due to age or medical condition  Airway Management Planned: Nasal Cannula, Natural Airway and Simple Face Mask  Additional Equipment:   Intra-op Plan:   Post-operative Plan: Extubation in OR  Informed Consent: I have reviewed the patients History and Physical, chart, labs and discussed the procedure including the risks, benefits and alternatives for the proposed anesthesia with the patient or authorized representative who has indicated his/her understanding and acceptance.     Dental advisory  given  Plan Discussed with: CRNA and Surgeon  Anesthesia Plan Comments:       Anesthesia Quick Evaluation

## 2019-12-23 NOTE — Care Management CC44 (Signed)
Condition Code 44 Documentation Completed  Patient Details  Name: Thomas Pratt MRN: [id number] Date of Birth: 10/21/1960   Condition Code 44 given:  Yes Patient signature on Condition Code 44 notice:  Yes Documentation of 2 MD's agreement:  Yes Code 44 added to claim:  Yes    Sherie Don, LCSW 12/23/2019, 5:05 PM

## 2019-12-23 NOTE — Transfer of Care (Signed)
Immediate Anesthesia Transfer of Care Note  Patient: Thomas Pratt  Procedure(s) Performed: ESOPHAGOGASTRODUODENOSCOPY (EGD) WITH PROPOFOL Possible esophageal variceal banding. (N/A )  Patient Location: PACU  Anesthesia Type:MAC  Level of Consciousness: awake, alert , oriented and patient cooperative  Airway & Oxygen Therapy: Patient Spontanous Breathing and Patient connected to nasal cannula oxygen  Post-op Assessment: Report given to RN, Post -op Vital signs reviewed and stable and Patient moving all extremities  Post vital signs: Reviewed  Last Vitals:  Vitals Value Taken Time  BP 85/53 12/23/19 1148  Temp    Pulse 69 12/23/19 1150  Resp 27 12/23/19 1150  SpO2 90 % 12/23/19 1150  Vitals shown include unvalidated device data.  Last Pain:  Vitals:   12/23/19 1135  TempSrc:   PainSc: 0-No pain      Patients Stated Pain Goal: 8 (94/49/[id number])  Complications: No apparent anesthesia complications

## 2019-12-24 ENCOUNTER — Telehealth: Payer: Self-pay

## 2019-12-24 ENCOUNTER — Other Ambulatory Visit: Payer: Self-pay | Admitting: *Deleted

## 2019-12-24 DIAGNOSIS — K625 Hemorrhage of anus and rectum: Secondary | ICD-10-CM

## 2019-12-24 LAB — ANGIOTENSIN CONVERTING ENZYME: Angiotensin-Converting Enzyme: 91 U/L — ABNORMAL HIGH (ref 14–82)

## 2019-12-24 NOTE — Telephone Encounter (Signed)
Patient called back. He is aware needs lab work 1st of week. He was d/c'd last night. He wants orders faxed to [facility] as that is where he normally gets his labs done. Patient does not need call back. fowading to LL and AS to do so.

## 2019-12-24 NOTE — Telephone Encounter (Signed)
TRANSITIONAL CARE MANAGEMENT TELEPHONE OUTREACH NOTE   Contact Date: 12/24/2019 Contacted By: Nigel Berthold, RMA   DISCHARGE INFORMATION Date of Discharge:12/23/2019 Discharge Facility: [name]  Principal Discharge Diagnosis:Type 2 DM   Outpatient Follow Up Recommendations  Recommendations for primary care physician for things to follow:   1)Avoid ibuprofen/Advil/Aleve/Motrin/Goody Powders/Naproxen/BC powders/Meloxicam/Diclofenac/Indomethacin and other Nonsteroidal anti-inflammatory medications as these will make you more likely to bleed and can cause stomach ulcers, can also cause Kidney problems.   2) follow-up with your primary care physician in 1 week for repeat CBC and BMP blood test  3) follow-up with gastroenterologist Dr. Gala Romney at [facility]  Thomas Pratt is a male primary care patient of Sharion Balloon, FNP. An outgoing telephone call was made today and I spoke with patient.  Thomas Pratt condition(s) and treatment(s) were discussed. An opportunity to ask questions was provided and all were answered or forwarded as appropriate.    ACTIVITIES OF DAILY LIVING  Thomas Pratt lives with a friend and he can perform ADLs independently. his primary caregiver is himself. he is able to depend on his primary caregiver(s) for consistent help. Transportation to appointments, to pick up medications, and to run errands is a problem.  (Consider referral to [facility] if transportation or a consistent caregiver is a problem)   Fall Risk Fall Risk  11/05/2019 10/08/2019  Falls in the past year? 0 0  Number falls in past yr: - -  Injury with Fall? - -    low [place] Modifications/Assistive Devices Wheelchair: No Cane: No Ramp: No Bedside Toilet: No Hospital Bed:  No Other:    [place] he is not receiving home health services.    MEDICATION RECONCILIATION  Thomas Pratt has been able to pick-up all  prescribed discharge medications from the pharmacy.   A post discharge medication reconciliation was performed and the complete medication list was reviewed with the patient/caregiver and is current as of 12/24/2019. Changes highlighted below.  Discontinued Medications none  Current Medication List Allergies as of 12/24/2019   No Known Allergies     Medication List       Accurate as of December 24, 2019  3:47 PM. If you have any questions, ask your nurse or doctor.        Accu-Chek Guide test strip Generic drug: glucose blood Test BS 4 (four) times daily. Dx E11.69 What changed:   how much to take  how to take this  when to take this  reasons to take this   Accu-Chek Guide w/Device Kit Test BS 4 (four) times daily. Dx E11.69   Accu-Chek Softclix Lancets lancets Test BS 4 (four) times daily. Dx E11.69 What changed:   how much to take  how to take this  when to take this  reasons to take this   atorvastatin 10 MG tablet Commonly known as: LIPITOR TAKE 1 TABLET BY MOUTH  DAILY What changed: how much to take   B-D UF III MINI PEN NEEDLES 31G X 5 MM Misc Generic drug: Insulin Pen Needle USE WITH TRESIBA daily Dx E11.69 What changed:   how much to take  how to take this  when to take this   blood glucose meter kit and supplies Dispense based on patient and insurance preference. Use up to four times daily as directed. (FOR ICD-10 E10.9, E11.9). What changed:   how much to take  how to take this  when to take this  cetirizine 10 MG tablet Commonly known as: ZYRTEC Take 1 tablet (10 mg total) by mouth daily.   Dexilant 60 MG capsule Generic drug: dexlansoprazole Take 1 capsule (60 mg total) by mouth daily.   escitalopram 10 MG tablet Commonly known as: Lexapro Take 1 tablet (10 mg total) by mouth daily.   fluticasone 50 MCG/ACT nasal spray Commonly known as: FLONASE Place 2 sprays into both nostrils daily.   metFORMIN 750 MG 24 hr  tablet Commonly known as: Glucophage XR Take 2 tablets (1,500 mg total) by mouth daily with breakfast.   nadolol 20 MG tablet Commonly known as: CORGARD Take 1 tablet (20 mg total) by mouth daily.   ondansetron 4 MG tablet Commonly known as: ZOFRAN Take 1 tablet (4 mg total) by mouth every 6 (six) hours as needed for nausea.   spironolactone 50 MG tablet Commonly known as: ALDACTONE Take 1 tablet (50 mg total) by mouth daily.   Tyler Aas FlexTouch 100 UNIT/ML FlexTouch Pen Generic drug: insulin degludec Inject 0.15 mLs (15 Units total) into the skin daily.   VITAMIN D PO Take 1 capsule by mouth daily.        PATIENT EDUCATION & FOLLOW-UP PLAN  An appointment for Transitional Care Management is scheduled with Sharion Balloon, FNP on 12/30/2019 at 9:25am.  Take all medications as prescribed  Contact our office by calling [phone] if you have any questions or concerns

## 2019-12-24 NOTE — Telephone Encounter (Signed)
    Transitional Care Management  Contact Attempt Attempt Date:12/24/2019 Attempted By: Nigel Berthold, RMA  1st unsuccessful TCM contact attempt.   I reached out to [name] on his preferred telephone number to discuss Transitional Care Management, medication reconciliation, and to schedule a TCM hospital follow-up with his PCP at [facility].  Discharge Date: 12/23/2019 Location: [name]  Discharge Dx: Type 2 DM   Recommendations for Outpatient Follow-up:  Recommendations for primary care physician for things to follow:   1)Avoid ibuprofen/Advil/Aleve/Motrin/Goody Powders/Naproxen/BC powders/Meloxicam/Diclofenac/Indomethacin and other Nonsteroidal anti-inflammatory medications as these will make you more likely to bleed and can cause stomach ulcers, can also cause Kidney problems.   2) follow-up with your primary care physician in 1 week for repeat CBC and BMP blood test  3) follow-up with gastroenterologist Dr. Gala Romney at [place] I left a HIPPA compliant message for him to return my call.  Will attempt to contact again within the 2 business day post discharge window if he does not return my call.

## 2019-12-24 NOTE — Telephone Encounter (Signed)
Noted.  Lab order placed and faxed.

## 2019-12-24 NOTE — Telephone Encounter (Signed)
Lmom for pt to call us back. 

## 2019-12-29 NOTE — Telephone Encounter (Signed)
Patient has not had labs completed. Can you please remind him of this?

## 2019-12-30 ENCOUNTER — Encounter: Payer: Self-pay | Admitting: Family

## 2019-12-30 ENCOUNTER — Ambulatory Visit (INDEPENDENT_AMBULATORY_CARE_PROVIDER_SITE_OTHER): Payer: Medicare Other | Admitting: Family

## 2019-12-30 ENCOUNTER — Other Ambulatory Visit: Payer: Self-pay

## 2019-12-30 VITALS — BP 110/71 | HR 71 | Temp 96.8°F | Ht 68.0 in | Wt 189.4 lb

## 2019-12-30 DIAGNOSIS — K7030 Alcoholic cirrhosis of liver without ascites: Secondary | ICD-10-CM | POA: Diagnosis not present

## 2019-12-30 DIAGNOSIS — I10 Essential (primary) hypertension: Secondary | ICD-10-CM | POA: Diagnosis not present

## 2019-12-30 DIAGNOSIS — K7460 Unspecified cirrhosis of liver: Secondary | ICD-10-CM

## 2019-12-30 DIAGNOSIS — Z09 Encounter for follow-up examination after completed treatment for conditions other than malignant neoplasm: Secondary | ICD-10-CM

## 2019-12-30 DIAGNOSIS — E1169 Type 2 diabetes mellitus with other specified complication: Secondary | ICD-10-CM

## 2019-12-30 DIAGNOSIS — K922 Gastrointestinal hemorrhage, unspecified: Secondary | ICD-10-CM | POA: Diagnosis not present

## 2019-12-30 DIAGNOSIS — R531 Weakness: Secondary | ICD-10-CM

## 2019-12-30 LAB — CBC WITH DIFFERENTIAL/PLATELET
Basophils Absolute: 0 10*3/uL (ref 0.0–0.2)
Basos: 1 %
EOS (ABSOLUTE): 0.2 10*3/uL (ref 0.0–0.4)
Eos: 2 %
Hematocrit: 37.9 % (ref 37.5–51.0)
Hemoglobin: 12.9 g/dL — ABNORMAL LOW (ref 13.0–17.7)
Immature Grans (Abs): 0 10*3/uL (ref 0.0–0.1)
Immature Granulocytes: 0 %
Lymphocytes Absolute: 2 10*3/uL (ref 0.7–3.1)
Lymphs: 31 %
MCH: 32.2 pg (ref 26.6–33.0)
MCHC: 34 g/dL (ref 31.5–35.7)
MCV: 95 fL (ref 79–97)
Monocytes Absolute: 0.5 10*3/uL (ref 0.1–0.9)
Monocytes: 8 %
Neutrophils Absolute: 3.7 10*3/uL (ref 1.4–7.0)
Neutrophils: 58 %
Platelets: 209 10*3/uL (ref 150–450)
RBC: 4.01 x10E6/uL — ABNORMAL LOW (ref 4.14–5.80)
RDW: 12.6 % (ref 11.6–15.4)
WBC: 6.3 10*3/uL (ref 3.4–10.8)

## 2019-12-30 LAB — CMP14+EGFR
ALT: 25 IU/L (ref 0–44)
AST: 28 IU/L (ref 0–40)
Albumin/Globulin Ratio: 1.3 (ref 1.2–2.2)
Albumin: 4.1 g/dL (ref 3.8–4.9)
Alkaline Phosphatase: 76 IU/L (ref 48–121)
BUN/Creatinine Ratio: 13 (ref 10–24)
BUN: 15 mg/dL (ref 8–27)
Bilirubin Total: 0.5 mg/dL (ref 0.0–1.2)
CO2: 21 mmol/L (ref 20–29)
Calcium: 9.3 mg/dL (ref 8.6–10.2)
Chloride: 101 mmol/L (ref 96–106)
Creatinine, Ser: 1.13 mg/dL (ref 0.76–1.27)
GFR calc Af Amer: 81 mL/min/{1.73_m2} (ref >59)
GFR calc non Af Amer: 70 mL/min/{1.73_m2} (ref >59)
Globulin, Total: 3.2 g/dL (ref 1.5–4.5)
Glucose: 266 mg/dL — ABNORMAL HIGH (ref 65–99)
Potassium: 5 mmol/L (ref 3.5–5.2)
Sodium: 136 mmol/L (ref 134–144)
Total Protein: 7.3 g/dL (ref 6.0–8.5)

## 2019-12-30 LAB — HEMOGLOBIN, FINGERSTICK: Hemoglobin: 12.9 g/dL (ref 12.6–17.7)

## 2019-12-30 NOTE — Telephone Encounter (Signed)
Lmom for pt to call us back. 

## 2019-12-30 NOTE — Telephone Encounter (Signed)
Routing to [place] for recommendations as he last saw patient. Looks like maybe he has had some chronic abdominal discomfort. We will be able to tell if he is losing any significant amount of blood once his CBC results.

## 2019-12-30 NOTE — Telephone Encounter (Signed)
Looks like pt had an appt with his PCP today and they drew labs including CBC.  Tried to call pt to inform him that he did not need to go to [facility] but had to leave him a message for him to call us back.  Also called his PCP and left message with Lattie Haw (receptionist) for pt to call us ASAP.

## 2019-12-30 NOTE — Telephone Encounter (Signed)
Called pt and he said that he will go to [facility] to have labs completed.

## 2019-12-30 NOTE — Telephone Encounter (Signed)
He does have chronic abdominal pain and has had this, essentially unchanged, for several visits. CT abdomen in the hospital without significant findings to explain. EGD as inpatient unremarkable. Regarding the bleeding, will need to wait for CBC results to see if there's any change in hgb. Last TCS 2018 and recommended 5 year repeat (2023) although if he continues with bleeding and abdominal pain, may need to consider early interval colonoscopy. He has an upcoming appointment in 7/27.  Please ask him to call us in a week if he is still having bleeding and/or abdominal pain. That will give [place] time to get his labs back. If still with persistent issues, can consider bumping up his appointment. In the meantime, if any worsening weakness, lightheadedness, shortness of breath, chest pain, passing out, nearly passing out or large volume blood he should proceed to the ER.

## 2019-12-30 NOTE — Telephone Encounter (Signed)
Called pt and informed him of EG's recommendations. Asked him to call us in a week if he is still having bleeding and/or abdominal pain. That will give [place] time to get his labs back. If still with persistent issues, can consider bumping up his appointment. In the meantime, if any worsening weakness, lightheadedness, shortness of breath, chest pain, passing out, nearly passing out or large volume blood he should proceed to the ER.  Pt voiced understanding.

## 2019-12-30 NOTE — Telephone Encounter (Signed)
Pt called back and confirmed that he did have labs drawn while he was at his PCP this morning.  Informed him that he did not have to go to [facility] to have done.  They ordered same lab.  Pt voiced understanding.  Pt wanted [place] to know he is still having rectal bleeding and blood in stool.  He said it is the size of a quarter.  He said he has it every time he has a bm and has abd pain with it.  He said he went to hospital for it last week and came out of hospital still with it.  Says it is no better.

## 2019-12-30 NOTE — Progress Notes (Signed)
Subjective:    Patient ID: Thomas Pratt, male    DOB: 01-21-1961, 59 y.o.   MRN: [id number]  Chief Complaint  Patient presents with  . Hospitalization Follow-up    HPI  Today's visit is for Transitional Care Management.  The patient was discharged from [facility] on 12/23/19 with a primary diagnosis of Type 2 DM and GI bleed.   Contact with the patient and/or caregiver, by a clinical staff member, was made on 12/24/19 and was documented as a telephone encounter within the EMR.  Through chart review and discussion with the patient I have determined that management of their condition is of moderate complexity.    PT went to the ED on 12/22/19 with rectal bleeding. He had a negative CT pelvis. He has medical history of alcoholic liver cirrhosis, esophageal varices, CVA, HTN, and DM. He reports he has not drink alcohol in over 4 years. He had an EGD that on 12/23/19- Innocent appearing of barely grade 2 esophageal varices. Mild portal gastropathy. No evidence of bleeding from the upper GI tract.  He reports he continues to bleed with every stool. He reports having 3 BM a day with dark red blood. He reports the blood is in the stool and when he wipes. He reports feeling weak,  Tired, headache, and SOB with exertion.   His DM have been 180-270's.   Review of Systems  Constitutional: Positive for fatigue.  Neurological: Positive for headaches.  All other systems reviewed and are negative.      Objective:   Physical Exam Vitals reviewed.  Constitutional:      General: He is not in acute distress.    Appearance: He is well-developed.  HENT:     Head: Normocephalic.     Right Ear: Tympanic membrane normal.     Left Ear: Tympanic membrane normal.  Eyes:     General:        Right eye: No discharge.        Left eye: No discharge.     Pupils: Pupils are equal, round, and reactive to light.  Neck:     Thyroid: No thyromegaly.  Cardiovascular:     Rate and Rhythm: Normal  rate and regular rhythm.     Heart sounds: Normal heart sounds. No murmur heard.   Pulmonary:     Effort: Pulmonary effort is normal. No respiratory distress.     Breath sounds: Normal breath sounds. No wheezing.  Abdominal:     General: Bowel sounds are normal. There is no distension.     Palpations: Abdomen is soft.     Tenderness: There is abdominal tenderness (LUQ & RUQ).  Musculoskeletal:        General: No tenderness. Normal range of motion.     Cervical back: Normal range of motion and neck supple.  Skin:    General: Skin is warm and dry.     Coloration: Skin is pale.     Findings: No erythema or rash.  Neurological:     Mental Status: He is alert and oriented to person, place, and time.     Cranial Nerves: No cranial nerve deficit.     Deep Tendon Reflexes: Reflexes are normal and symmetric.  Psychiatric:        Behavior: Behavior normal.        Thought Content: Thought content normal.        Judgment: Judgment normal.      BP 110/71   Pulse 71  Temp (!) 96.8 F (36 C) (Temporal)   Ht '5\' 8"'  (1.727 m)   Wt 189 lb 6.4 oz (85.9 kg)   BMI 28.80 kg/m       Assessment & Plan:  Thomas Pratt comes in today with chief complaint of Hospitalization Follow-up   Diagnosis and orders addressed:  1. Hospital discharge follow-up - Hemoglobin, fingerstick - CBC with Differential/Platelet - CMP14+EGFR  2. Gastrointestinal hemorrhage, unspecified gastrointestinal hemorrhage type - Hemoglobin, fingerstick - CBC with Differential/Platelet - CMP14+EGFR  3. Essential hypertension - CBC with Differential/Platelet - CMP14+EGFR  4. Alcoholic cirrhosis of liver without ascites (HCC) - CBC with Differential/Platelet - CMP14+EGFR  5. Cirrhosis of liver without ascites, unspecified hepatic cirrhosis type ([place]) - CBC with Differential/Platelet - CMP14+EGFR  6. Type 2 diabetes mellitus with other specified complication, unspecified whether long term insulin use  (HCC) - CBC with Differential/Platelet - CMP14+EGFR  7. Weakness - CBC with Differential/Platelet - CMP14+EGFR   Labs pending Health Maintenance reviewed Diet and exercise encouraged  Follow up plan: 1 month and keep GI appt   Evelina Dun, FNP

## 2019-12-30 NOTE — Patient Instructions (Signed)
Gastrointestinal Bleeding Gastrointestinal (GI) bleeding is bleeding somewhere along the digestive tract, between the mouth and the anus. This tract includes the mouth, esophagus, stomach, small intestine, large intestine, and anus. The large intestine is often called the colon. GI bleeding can be caused by various problems. The severity of these problems can range from mild to serious or even life-threatening. If you have GI bleeding, you may find blood in your stools (feces), you may have black stools, or you may vomit blood. If there is a lot of bleeding, you may need to stay in the hospital. What are the causes? This condition may be caused by:  Inflammation, irritation, or swelling of the esophagus (esophagitis). The esophagus is part of the body that moves food from your mouth to your stomach.  Swollen veins in the rectum (hemorrhoids).  Areas of painful tearing in the anus that are often caused by passing hard stool (anal fissures).  Pouches that form on the colon over time, with age, and may bleed a lot (diverticulosis).  Inflammation (diverticulitis) in areas with diverticulosis. This can cause pain, fever, and bloody stools, although bleeding may be mild.  Growths (polyps) or cancer. Colon cancer often starts out as precancerous polyps.  Gastritis and ulcers. With these, bleeding may come from the upper GI tract, near the stomach. What increases the risk? You are more likely to develop this condition if you:  Have an infection in your stomach from a type of bacteria called Helicobacter pylori.  Take certain medicines, such as: ? NSAIDs. ? Aspirin. ? Selective serotonin reuptake inhibitors (SSRIs). ? Steroids. ? Antiplatelet or anticoagulant medicines.  Smoke.  Drink alcohol. What are the signs or symptoms? Common symptoms of this condition include:  Bright red blood in your vomit, or vomit that looks like coffee grounds.  Bloody, black, or tarry stools. ? Bleeding  from the lower GI tract will usually cause red or maroon blood in the stools. ? Bleeding from the upper GI tract may cause black, tarry stools that are often stronger smelling than usual. ? In certain cases, if the bleeding is fast enough, the stools may be red.  Pain or cramping in the abdomen. How is this diagnosed? This condition may be diagnosed based on:  Your medical history and a physical exam.  Various tests, such as: ? Blood tests. ? Stool tests. ? X-rays and other imaging tests. ? Esophagogastroduodenoscopy (EGD). In this test, a flexible, lighted tube is used to look at your esophagus, stomach, and small intestine. ? Colonoscopy. In this test, a flexible, lighted tube is used to look at your colon. How is this treated? Treatment for this condition depends on the cause of the bleeding. For example:  For bleeding from the esophagus, stomach, small intestine, or colon, the health care provider doing your EGD or colonoscopy may be able to stop the bleeding as part of the procedure.  Inflammation or infection of the colon can be treated with medicines.  Certain rectal problems can be treated with creams, suppositories, or warm baths.  Medicines may be given to reduce acid in your stomach.  Surgery is sometimes needed.  Blood transfusions are sometimes needed if a lot of blood has been lost. If bleeding is mild, you may be allowed to go home. If there is a lot of bleeding, you will need to stay in the hospital for observation. Follow these instructions at home:   Take over-the-counter and prescription medicines only as told by your health care provider.    Eat foods that are high in fiber, such as beans, whole grains, and fresh fruits and vegetables. This will help to keep your stools soft. Eating 1-3 prunes each day works well for many people.  Drink enough fluid to keep your urine pale yellow.  Keep all follow-up visits as told by your health care provider. This is  important. Contact a health care provider if:  Your symptoms do not improve. Get help right away if:  Your bleeding does not stop.  You feel light-headed or you faint.  You feel weak.  You have severe cramps in your back or abdomen.  You pass large blood clots in your stool.  Your symptoms are getting worse.  You have chest pain or fast heartbeats. Summary  Gastrointestinal (GI) bleeding is bleeding somewhere along the digestive tract, between the mouth and anus. GI bleeding can be caused by various problems. The severity of these problems can range from mild to serious or even life-threatening.  Treatment for this condition depends on the cause of the bleeding.  Take over-the-counter and prescription medicines only as told by your health care provider.  Keep all follow-up visits as told by your health care provider. This is important.  Get help right away if your bleeding increases, your symptoms are getting worse, or you have new symptoms. This information is not intended to replace advice given to you by your health care provider. Make sure you discuss any questions you have with your health care provider. Document Revised: 02/18/2018 Document Reviewed: 02/18/2018 Elsevier Patient Education  2020 [employer]  

## 2019-12-31 ENCOUNTER — Other Ambulatory Visit: Payer: Self-pay | Admitting: Family

## 2019-12-31 DIAGNOSIS — E1169 Type 2 diabetes mellitus with other specified complication: Secondary | ICD-10-CM

## 2019-12-31 MED ORDER — TRESIBA FLEXTOUCH 100 UNIT/ML ~~LOC~~ SOPN: 18.0000 [IU] | PEN_INJECTOR | Freq: Every day | SUBCUTANEOUS | 1 refills | Status: DC

## 2020-01-03 ENCOUNTER — Other Ambulatory Visit: Payer: Self-pay | Admitting: Family

## 2020-01-03 DIAGNOSIS — E1169 Type 2 diabetes mellitus with other specified complication: Secondary | ICD-10-CM

## 2020-01-03 DIAGNOSIS — I10 Essential (primary) hypertension: Secondary | ICD-10-CM

## 2020-01-03 MED ORDER — SPIRONOLACTONE 50 MG PO TABS: 50.0000 mg | ORAL_TABLET | Freq: Every day | ORAL | 0 refills | Status: DC

## 2020-01-03 NOTE — Addendum Note (Signed)
Addended by: Antonietta Barcelona D on: 01/03/2020 11:50 AM   Modules accepted: Orders

## 2020-01-04 ENCOUNTER — Telehealth: Payer: Self-pay

## 2020-01-04 NOTE — Telephone Encounter (Signed)
Patient's rectal bleeding has stopped but he is now experiencing constipation.  What do you recommend?

## 2020-01-04 NOTE — Telephone Encounter (Signed)
Patient aware.

## 2020-01-04 NOTE — Telephone Encounter (Signed)
He can take a stool softener or increase his fiber. Make sure he stays hydrated.

## 2020-01-06 ENCOUNTER — Other Ambulatory Visit: Payer: Self-pay

## 2020-01-06 ENCOUNTER — Ambulatory Visit (INDEPENDENT_AMBULATORY_CARE_PROVIDER_SITE_OTHER): Payer: Medicare Other | Admitting: Pharmacist

## 2020-01-06 DIAGNOSIS — E1169 Type 2 diabetes mellitus with other specified complication: Secondary | ICD-10-CM | POA: Diagnosis not present

## 2020-01-06 NOTE — Telephone Encounter (Signed)
Lmom for pt to call back. 

## 2020-01-06 NOTE — Telephone Encounter (Signed)
Called pt and informed him that LSL reviewed cbc from PCP. Hgb 12.9 stable. Per note to PCP, rectal bleeding stopped.  Pt confirmed with me that it had stopped.  Pt made aware to keep OV with EG which is upcoming. Pt made aware to call in interim if any changes or worsening symptoms.  Pt voiced understanding.  He also informed me that he has an appt on Monday with PCP.

## 2020-01-06 NOTE — Progress Notes (Signed)
    01/06/2020 Name: Thomas Pratt MRN: [id number] DOB: 1960-11-05   S:  3 yom Presents for diabetes evaluation, education, and management Patient was referred and last seen by Primary Care Provider on 12/30/19.  Insurance coverage/medication affordability: UHC DSNP (MEDICAID/MEDICARE)  Patient reports adherence with medications. . Current diabetes medications include: METFORMIN, TRESIBA . Current hypertension medications include: AMLODIPINE, NADOLOL, SPIRONOLACTONE Goal 130/80 . Current hyperlipidemia medications include: ATORVASTATIN  Patient denies hypoglycemic events.   Patient reported dietary habits: Eats 2-3 meals/day -sees dietician in [place] -reviewed meals, meal planning, handouts provided  Discussed meal planning options and Plate method for health eating  Avoid sugary drinks and desserts  Incorporate balanced protein, non starchy veggies, 1 serving of carbohydrate  Increase water intake  Increase physical activity as able   Patient-reported exercise habits: YARDWORK  Patient denies nocturia (nighttime urination).  Patient denies neuropathy (nerve pain).  Patient denies visual changes.  Patient denies self foot exams.    O:  Lab Results  Component Value Date   HGBA1C 8.6 (H) 12/21/2019   Lipid Panel     Component Value Date/Time   CHOL 181 05/20/2019 1452   TRIG 193 (H) 05/20/2019 1452   HDL 45 05/20/2019 1452   CHOLHDL 4.0 05/20/2019 1452   CHOLHDL 3.4 09/26/2017 1101   LDLCALC 103 (H) 05/20/2019 1452   LDLCALC 108 (H) 09/26/2017 1101     Home fasting blood sugars: 120-130s  2 hour post-meal/random blood sugars: n/a.     A/P:  Diabetes T2DM currently UNCONTROLLED. Patient is able to verbalize appropriate hypoglycemia management plan. Patient is adherent with medication. Control is suboptimal due to DIET.  -Continued basal insulin TRESIBA (insulin DEGLUDEC)--dose increased to 22 units nightly  -CONTINUE metformin  -Consider  GLP1 (I.e. ozempic vs trulicity) if patient not controlled with diet/lifestyle changes  -Extensively discussed pathophysiology of diabetes, recommended lifestyle interventions, dietary effects on blood sugar control  -Counseled on s/sx of and management of hypoglycemia  -Next A1C anticipated 01/10/20   Written patient instructions provided.  Total time in face to face counseling 25 minutes.   Follow up PCP Clinic Visit ON 01/10/20.  Follow up PharmD on 02/14/20  Regina Eck, PharmD, [facility] Clinical Pharmacist, [place]  II Phone [phone]

## 2020-01-06 NOTE — Telephone Encounter (Signed)
Reviewed cbc from PCP. Hgb 12.9 stable. Per note to PCP, rectal bleeding stopped.   Keep OV with EG which is upcoming. He can call in interim if any change or worsening symptoms.

## 2020-01-10 ENCOUNTER — Encounter: Payer: Self-pay | Admitting: Family

## 2020-01-10 ENCOUNTER — Ambulatory Visit (INDEPENDENT_AMBULATORY_CARE_PROVIDER_SITE_OTHER): Payer: Medicare Other | Admitting: Family

## 2020-01-10 ENCOUNTER — Other Ambulatory Visit: Payer: Self-pay

## 2020-01-10 VITALS — BP 115/73 | HR 78 | Temp 97.8°F | Ht 68.0 in | Wt 188.4 lb

## 2020-01-10 DIAGNOSIS — J301 Allergic rhinitis due to pollen: Secondary | ICD-10-CM | POA: Diagnosis not present

## 2020-01-10 DIAGNOSIS — F320 Major depressive disorder, single episode, mild: Secondary | ICD-10-CM

## 2020-01-10 DIAGNOSIS — R1031 Right lower quadrant pain: Secondary | ICD-10-CM

## 2020-01-10 DIAGNOSIS — I10 Essential (primary) hypertension: Secondary | ICD-10-CM | POA: Diagnosis not present

## 2020-01-10 DIAGNOSIS — K7030 Alcoholic cirrhosis of liver without ascites: Secondary | ICD-10-CM

## 2020-01-10 DIAGNOSIS — K7460 Unspecified cirrhosis of liver: Secondary | ICD-10-CM

## 2020-01-10 DIAGNOSIS — E1169 Type 2 diabetes mellitus with other specified complication: Secondary | ICD-10-CM

## 2020-01-10 DIAGNOSIS — F1011 Alcohol abuse, in remission: Secondary | ICD-10-CM

## 2020-01-10 DIAGNOSIS — J0140 Acute pansinusitis, unspecified: Secondary | ICD-10-CM | POA: Diagnosis not present

## 2020-01-10 MED ORDER — CETIRIZINE HCL 10 MG PO TABS: 10.0000 mg | ORAL_TABLET | Freq: Every day | ORAL | 11 refills | Status: DC

## 2020-01-10 MED ORDER — TRESIBA FLEXTOUCH 100 UNIT/ML ~~LOC~~ SOPN: 22.0000 [IU] | PEN_INJECTOR | Freq: Every day | SUBCUTANEOUS | 1 refills | Status: DC

## 2020-01-10 NOTE — Progress Notes (Signed)
Subjective:    Patient ID: Thomas Pratt, male    DOB: 1961/02/16, 59 y.o.   MRN: [id number]  Chief Complaint  Patient presents with  . Medical Management of Chronic Issues  . Hernia   PT presents to the office today for chronic follow up. He is followed by our clinical pharmacists  for DM education.  He is followed by GI  for cirrhosis of liver. He has been sober since 03/2016 and needs to get counseling program to get on transplant list. PT is stable at this time.   He reports he has had inguinal hernia repair approx 2018. He reports he has had intermittent aching pain 9 out 10 that hurts when he lays on that side.  Diabetes He presents for his follow-up diabetic visit. He has type 2 diabetes mellitus. His disease course has been stable. There are no hypoglycemic associated symptoms. Associated symptoms include blurred vision and foot paresthesias. Symptoms are stable. Diabetic complications include peripheral neuropathy. Pertinent negatives for diabetic complications include no CVA or heart disease. Risk factors for coronary artery disease include diabetes mellitus, dyslipidemia, male sex, hypertension and sedentary lifestyle. His overall blood glucose range is >200 mg/dl. An ACE inhibitor/angiotensin II receptor blocker is being taken. Eye exam is current.  Hypertension This is a chronic problem. The current episode started more than 1 year ago. The problem has been resolved since onset. The problem is controlled. Associated symptoms include blurred vision and malaise/fatigue. Pertinent negatives include no peripheral edema or shortness of breath. Risk factors for coronary artery disease include dyslipidemia, obesity, male gender and sedentary lifestyle. The current treatment provides moderate improvement. There is no history of CAD/MI or CVA.  Depression        This is a chronic problem.  The current episode started more than 1 year ago.   The onset quality is gradual.   The problem  occurs intermittently.  Past treatments include SSRIs - Selective serotonin reuptake inhibitors.  Compliance with treatment is good.     Review of Systems  Constitutional: Positive for malaise/fatigue.  Eyes: Positive for blurred vision.  Respiratory: Negative for shortness of breath.   Psychiatric/Behavioral: Positive for depression.  All other systems reviewed and are negative.      Objective:   Physical Exam Vitals reviewed.  Constitutional:      General: He is not in acute distress.    Appearance: He is well-developed.  HENT:     Head: Normocephalic.     Right Ear: Tympanic membrane normal.     Left Ear: Tympanic membrane normal.  Eyes:     General:        Right eye: No discharge.        Left eye: No discharge.     Pupils: Pupils are equal, round, and reactive to light.  Neck:     Thyroid: No thyromegaly.  Cardiovascular:     Rate and Rhythm: Normal rate and regular rhythm.     Heart sounds: Normal heart sounds. No murmur heard.   Pulmonary:     Effort: Pulmonary effort is normal. No respiratory distress.     Breath sounds: Normal breath sounds. No wheezing.  Abdominal:     General: Bowel sounds are normal. There is no distension.     Palpations: Abdomen is soft.     Tenderness: There is abdominal tenderness (RUQ and RLQ). There is guarding.  Musculoskeletal:        General: No tenderness. Normal range of motion.  Cervical back: Normal range of motion and neck supple.  Skin:    General: Skin is warm and dry.     Findings: No erythema or rash.  Neurological:     Mental Status: He is alert and oriented to person, place, and time.     Cranial Nerves: No cranial nerve deficit.     Deep Tendon Reflexes: Reflexes are normal and symmetric.  Psychiatric:        Behavior: Behavior normal.        Thought Content: Thought content normal.        Judgment: Judgment normal.        BP 115/73   Pulse 78   Temp 97.8 F (36.6 C) (Temporal)   Ht _0  (1.727 m)    Wt 188 lb 6.4 oz (85.5 kg)   SpO2 97%   BMI 28.65 kg/m   Assessment & Plan:  Thomas Pratt comes in today with chief complaint of Medical Management of Chronic Issues and Hernia   Diagnosis and orders addressed:    2. Essential hypertension - CMP14+EGFR - CBC with Differential/Platelet  3. Alcoholic cirrhosis of liver without ascites (HCC) - CMP14+EGFR - CBC with Differential/Platelet  4. Cirrhosis of liver without ascites, unspecified hepatic cirrhosis type (HCC) - CMP14+EGFR - CBC with Differential/Platelet  5. Type 2 diabetes mellitus with other specified complication, unspecified whether long term insulin use (HCC) - Will increase Tresiba to 22 units from 18 units Keep appt with Clinical Pharm - insulin degludec (TRESIBA FLEXTOUCH) 100 UNIT/ML FlexTouch Pen; Inject 0.22 mLs (22 Units total) into the skin daily.  Dispense: 3 mL; Refill: 1 - CMP14+EGFR - CBC with Differential/Platelet  6. Depression, major, single episode, mild (HCC) - CMP14+EGFR - CBC with Differential/Platelet  7. History of ETOH abuse - CMP14+EGFR - CBC with Differential/Platelet  8. Type 2 diabetes mellitus with other specified complication, without long-term current use of insulin (HCC) - insulin degludec (TRESIBA FLEXTOUCH) 100 UNIT/ML FlexTouch Pen; Inject 0.22 mLs (22 Units total) into the skin daily.  Dispense: 3 mL; Refill: 1 - CMP14+EGFR - CBC with Differential/Platelet  9. Right lower quadrant abdominal pain -Reviewed CT abdomen and pelvis scan that he had on 12/22/19, no acute findings. He can call his surgeon for follow up.   Labs pending Health Maintenance reviewed Diet and exercise encouraged  Follow up plan: 3 months  Evelina Dun, FNP

## 2020-01-10 NOTE — Patient Instructions (Signed)

## 2020-01-11 LAB — CMP14+EGFR
ALT: 27 IU/L (ref 0–44)
AST: 24 IU/L (ref 0–40)
Albumin/Globulin Ratio: 1.4 (ref 1.2–2.2)
Albumin: 4.7 g/dL (ref 3.8–4.9)
Alkaline Phosphatase: 77 IU/L (ref 48–121)
BUN/Creatinine Ratio: 14 (ref 10–24)
BUN: 14 mg/dL (ref 8–27)
Bilirubin Total: 0.8 mg/dL (ref 0.0–1.2)
CO2: 24 mmol/L (ref 20–29)
Calcium: 10.7 mg/dL — ABNORMAL HIGH (ref 8.6–10.2)
Chloride: 94 mmol/L — ABNORMAL LOW (ref 96–106)
Creatinine, Ser: 1.02 mg/dL (ref 0.76–1.27)
GFR calc Af Amer: 92 mL/min/{1.73_m2} (ref >59)
GFR calc non Af Amer: 80 mL/min/{1.73_m2} (ref >59)
Globulin, Total: 3.3 g/dL (ref 1.5–4.5)
Glucose: 258 mg/dL — ABNORMAL HIGH (ref 65–99)
Potassium: 4.8 mmol/L (ref 3.5–5.2)
Sodium: 131 mmol/L — ABNORMAL LOW (ref 134–144)
Total Protein: 8 g/dL (ref 6.0–8.5)

## 2020-01-11 LAB — CBC WITH DIFFERENTIAL/PLATELET
Basophils Absolute: 0 10*3/uL (ref 0.0–0.2)
Basos: 0 %
EOS (ABSOLUTE): 0.2 10*3/uL (ref 0.0–0.4)
Eos: 2 %
Hematocrit: 42.5 % (ref 37.5–51.0)
Hemoglobin: 14.4 g/dL (ref 13.0–17.7)
Immature Grans (Abs): 0 10*3/uL (ref 0.0–0.1)
Immature Granulocytes: 0 %
Lymphocytes Absolute: 2.6 10*3/uL (ref 0.7–3.1)
Lymphs: 34 %
MCH: 31.4 pg (ref 26.6–33.0)
MCHC: 33.9 g/dL (ref 31.5–35.7)
MCV: 93 fL (ref 79–97)
Monocytes Absolute: 0.6 10*3/uL (ref 0.1–0.9)
Monocytes: 8 %
Neutrophils Absolute: 4.3 10*3/uL (ref 1.4–7.0)
Neutrophils: 56 %
Platelets: 236 10*3/uL (ref 150–450)
RBC: 4.58 x10E6/uL (ref 4.14–5.80)
RDW: 12.2 % (ref 11.6–15.4)
WBC: 7.7 10*3/uL (ref 3.4–10.8)

## 2020-01-17 ENCOUNTER — Telehealth: Payer: Self-pay | Admitting: Family

## 2020-01-18 ENCOUNTER — Encounter: Payer: Self-pay | Admitting: Pharmacist

## 2020-01-18 NOTE — Telephone Encounter (Signed)
PT had told me he was followed by them for hernia repair and I told him to keep follow up appts. This could have been hx and not current? If that is the case, no need to follow up with them.

## 2020-01-21 ENCOUNTER — Telehealth: Payer: Self-pay | Admitting: Emergency Medicine

## 2020-01-21 NOTE — Telephone Encounter (Signed)
Pt called and stated his abd has been hurting since his surgery with doctor jenkins due to his hernia and may need an xray. Pt stated he f/u with doctor jenkins office and they stated for the pt to f/u with [place] because its been about 2 years  Pt stated he has not tried anything otc and has went to his pcp but they notified him to f/u with doctor jenkins office as well.pt denies fever or vomiting and pts last bm was today and it was normal

## 2020-02-07 DIAGNOSIS — H2513 Age-related nuclear cataract, bilateral: Secondary | ICD-10-CM | POA: Diagnosis not present

## 2020-02-07 DIAGNOSIS — E119 Type 2 diabetes mellitus without complications: Secondary | ICD-10-CM | POA: Diagnosis not present

## 2020-02-07 DIAGNOSIS — H35361 Drusen (degenerative) of macula, right eye: Secondary | ICD-10-CM | POA: Diagnosis not present

## 2020-02-07 LAB — HM DIABETES EYE EXAM

## 2020-02-14 ENCOUNTER — Ambulatory Visit (INDEPENDENT_AMBULATORY_CARE_PROVIDER_SITE_OTHER): Payer: Medicare Other | Admitting: Pharmacist

## 2020-02-14 ENCOUNTER — Other Ambulatory Visit: Payer: Self-pay

## 2020-02-14 ENCOUNTER — Encounter: Payer: Self-pay | Admitting: Pharmacist

## 2020-02-14 VITALS — BP 120/80 | HR 72

## 2020-02-14 DIAGNOSIS — E1169 Type 2 diabetes mellitus with other specified complication: Secondary | ICD-10-CM | POA: Diagnosis not present

## 2020-02-14 MED ORDER — DAPAGLIFLOZIN PROPANEDIOL 10 MG PO TABS
10.0000 mg | ORAL_TABLET | Freq: Every day | ORAL | 3 refills | Status: DC
Start: 2020-02-14 — End: 2021-03-09

## 2020-02-14 NOTE — Progress Notes (Signed)
    02/14/2020 Name: Malvern Kadlec MRN: [id number] DOB: May 27, 1961   S:  54 yoF presents for diabetes evaluation, education, and management Patient was referred and last seen by Primary Care Provider on 01/06/20.  Insurance coverage/medication affordability: UHC medicare/medicaid  Patient reports adherence with medications. . Current diabetes medications include: metformin, tresiba . Current hypertension medications include: nadolol, spironolactone Goal 130/80 . Current hyperlipidemia medications include: atorvastatin   Patient denies hypoglycemic events.   Patient reported dietary habits: Eats 2-3 meals/day Discussed meal planning options and Plate method for healthy eating -sees dietician in [place] . Avoid sugary drinks and desserts . Incorporate balanced protein, non starchy veggies, 1 serving of carbohydrate with each meal . Increase water intake . Increase physical activity as able   Patient-reported exercise habits: n/a, hernia repair is causing him pain  Patient reports nocturia (nighttime urination).  Patient reports neuropathy (nerve pain).  Patient reports visual changes. Follows with eye doctor  Patient reports self foot exams.    O:  Lab Results  Component Value Date   HGBA1C 8.6 (H) 12/21/2019    There were no vitals filed for this visit.     Lipid Panel     Component Value Date/Time   CHOL 181 05/20/2019 1452   TRIG 193 (H) 05/20/2019 1452   HDL 45 05/20/2019 1452   CHOLHDL 4.0 05/20/2019 1452   CHOLHDL 3.4 09/26/2017 1101   LDLCALC 103 (H) 05/20/2019 1452   LDLCALC 108 (H) 09/26/2017 1101     Home fasting blood sugars: 180, 188, 240, 211, 204, 146, 175  2 hour post-meal/random blood sugars: 240, 231    A/P:  Diabetes T2DM currently UNCONTROLLED. Patient is able to verbalize appropriate hypoglycemia management plan. Patient is adherent with medication. Control is suboptimal due to DIET.  -Continued basal insulin TRESIBA  (insulin DEGLUDEC)--dose increased to 22 units nightly  -CONTINUE metformin  -STARTED Farxiga 10mg  daily  Samples given-->LOT#[id number], EXP 1/24  Counseled Sick day rules: if sick, vomiting, have diarrhea, or cannot drink enough fluids, you should stop taking SGLT-2 inhibitors until your symptoms go away. In rare cases, these  medicines can cause diabetic ketoacidosis (DKA). DKA is acid buildup in the blood.  -Extensively discussed pathophysiology of diabetes, recommended lifestyle interventions, dietary effects on blood sugar control  -Counseled on s/sx of and management of hypoglycemia  -Next A1C anticipated 03/2020.   Written patient instructions provided.  Total time in face to face counseling 30 minutes.   Follow up Pharmacist Clinic Visit in 1 month   Regina Eck, PharmD, BCPS Clinical Pharmacist, [place]  II Phone [phone]

## 2020-02-15 ENCOUNTER — Encounter: Payer: Self-pay | Admitting: Nurse Practitioner

## 2020-02-15 ENCOUNTER — Ambulatory Visit (INDEPENDENT_AMBULATORY_CARE_PROVIDER_SITE_OTHER): Payer: Medicare Other | Admitting: Nurse Practitioner

## 2020-02-15 ENCOUNTER — Encounter: Payer: Self-pay | Admitting: *Deleted

## 2020-02-15 VITALS — BP 120/64 | HR 73 | Temp 97.0°F | Ht 68.0 in | Wt 186.6 lb

## 2020-02-15 DIAGNOSIS — F1011 Alcohol abuse, in remission: Secondary | ICD-10-CM | POA: Diagnosis not present

## 2020-02-15 DIAGNOSIS — R1084 Generalized abdominal pain: Secondary | ICD-10-CM

## 2020-02-15 DIAGNOSIS — R1319 Other dysphagia: Secondary | ICD-10-CM

## 2020-02-15 DIAGNOSIS — R1310 Dysphagia, unspecified: Secondary | ICD-10-CM

## 2020-02-15 DIAGNOSIS — K7460 Unspecified cirrhosis of liver: Secondary | ICD-10-CM | POA: Diagnosis not present

## 2020-02-15 DIAGNOSIS — K922 Gastrointestinal hemorrhage, unspecified: Secondary | ICD-10-CM | POA: Diagnosis not present

## 2020-02-15 NOTE — Assessment & Plan Note (Signed)
Previous recent hospital admission for GI bleed.  No further GI bleed noted.  Due for CBC for chronic cirrhosis monitoring.  Further recommendations to follow.  Monitor for any recurrent GI bleed and let us know if he sees any.

## 2020-02-15 NOTE — Assessment & Plan Note (Signed)
Chronic, persistent, ongoing and slightly worsening right inguinal pain.  He saw the surgeon in passing who states he does not think there is any problem.  Referred back to GI.  CT of the abdomen and pelvis completed last month with no acute findings.  At this point I put in for focused right inguinal ultrasound to check for any specific abnormalities in this area.  He had an inguinal hernia repair in 2018.  Query worsening discomfort due to persistent scar tissue.  Further recommendations to follow his imaging results.  Continue current medications and follow-up in 6 months.

## 2020-02-15 NOTE — Progress Notes (Signed)
Referring Provider: Sharion Balloon, FNP Primary Care Physician:  Sharion Balloon, FNP Primary GI:  Dr. Gala Romney  Chief Complaint  Patient presents with  . Cirrhosis    f/u  . Abdominal Pain    c/o abd pain mid center abd, swelling, comes/goes, sometimes makes difficult to walk  . Dysphagia    no issue currently  . GI Bleeding    HPI:   Thomas Pratt is a 59 y.o. male who presents for follow-up on Cirrhosis and dysphagia. The patient was last seen in our office 09/01/19 for Cirrhosis, dysphagia, and history of ETOH abuse.  Patient was last seen in our office 09/01/2019 for the same.  Abstinent from alcohol for 3 years, history of positive ANA presumed PBC as well as alk phos and subsequently started on Urso.  Evaluated by [facility] liver transplant who recommended continue Corgard 40 mg daily and 20 mg in the afternoon, lactulose, rifaximin as well as diuretics but recommended no further Urso until liver biopsy returned.  Requirement of 6 months of alcohol/substance abuse counseling prior to transplant eval.  EGD and colonoscopy up-to-date in 2018.  Meld score chronically around 14, child Pugh class A.  Liver imaging as recommended.  EGD was updated 05/03/2019 which found grade 2 esophageal varices status post dilation of esophagus, portal hypertensive gastropathy, normal duodenum and recommended continue medications including Dexilant 60 mg daily.  At his last visit noted persistent abdominal discomfort similar to previous rated 7-9 out of 10, swallowing improved after EGD.  His pain is intermittent about every 3 days typically last 2 to 3 hours.  Still on Dexilant without GERD symptoms.  Bowel movement 3 times daily.  He is on Xifaxan but not on lactulose.  Had had some weight loss that he attributes to recent diagnosis of diabetes and since try to get this better managed his weight has, come back somewhat.  No other overt hepatic symptoms.  Still has not been able to get established with a  55-monthalcohol counseling program and [name].  Query availability of [place] behavioral therapy and he was provided contact information for this.  At this point has been sober for 3-1/2 years.  Recommended continue current medications, low-salt diet, updated labs and imaging, try to find 660-monthutpatient counseling program that will make him eligible for transplant if needed, follow-up in 6 months.  Labs completed 09/16/2019 including CBC which was normal, CMP with hyperglycemia at 152 and ALT mildly elevated at 46, other LFTs and creatinine normal.  INR stable at 1.1.  AFP normal.  Calculated meld score at 7, child Pugh class A.  Right upper quadrant ultrasound completed 09/16/2019 which found consistent with parenchymal liver disease likely superimposed hepatic steatosis and cirrhosis.  No focal liver lesions noted.  Patient was admitted to the hospital 12/22/2019 for acute GI bleeding.  He presented for bloody stools, hemoglobin above 12, blood pressure somewhat soft with noted abdominal pain but abdominal CT abdomen and pelvis without abnormality.  History of grade 2 esophageal varices.  He was treated with octreotide and IV Protonix bolus and infusion.  EGD completed 12/23/2019 with innocent appearing and barely grade 2 esophageal varices, mild portal hypertensive gastropathy, no evidence of bleeding from the upper GI tract.  Recommend he resume his current medications.  He last saw primary care 01/10/2020.  At that time noted inguinal hernia repair in 2018 with some intermittent aching pain when he lays on his side.  Reviewed CT  of the abdomen and pelvis with no acute findings, recommended he call surgeon for follow-up.  Today he states he's doing ok overall. Still abstinent from alcohol. He found an alcohol treatment center in [place], [place] and is going to go check it out. Still with chronic abdominal pain; his hernia area (right inguinal hernia) is hurting more. Saw Dr. Arnoldo Morale and  states it looked good. Reviewed CT with him, appears normal. Dysphagia is doing well. Feels like he has had some pain in the ball of his feet as well as numbness; has been trying to get better control of DM and was started on a new medication by PCP. No further bleeding since hospital d/c. Denies N/V, melena, fever, chills, unintentional weight loss. Denies any yellowing of the skin/eyes, darkened urine, acute episodic confusion, generalized tremors, generalized pruritis. Denies URI or flu-like symptoms. Denies loss of sense of taste or smell. The patient has received COVID-19 vaccination(s). Denies chest pain, dyspnea, dizziness, lightheadedness, syncope, near syncope. Denies any other upper or lower GI symptoms.  Is still on Corguard, Xifaxan, Aldactone. HR 73 today, BP 120/64. Varices look well controlled at last EGD.  Past Medical History:  Diagnosis Date  . Cirrhosis ([place])    likely ETOH use   . Diabetes ([place])   . Heat stroke 2016/17    Past Surgical History:  Procedure Laterality Date  . CHOLECYSTECTOMY N/A 09/01/2017   Procedure: LAPAROSCOPIC CHOLECYSTECTOMY;  Surgeon: Aviva Signs, MD;  Location: [facility];  Service: General;  Laterality: N/A;  . COLONOSCOPY WITH PROPOFOL N/A 03/06/2017   Dr. Gala Romney: tubular adenoma removed, next TCS 5 years if health permits.   . ESOPHAGOGASTRODUODENOSCOPY (EGD) WITH PROPOFOL N/A 03/06/2017   Dr. Gala Romney: grade 1 esophageal varices, erosive esophagitis, portal gastropathy.   . ESOPHAGOGASTRODUODENOSCOPY (EGD) WITH PROPOFOL N/A 05/03/2019   Procedure: ESOPHAGOGASTRODUODENOSCOPY (EGD) WITH PROPOFOL;  Surgeon: Daneil Dolin, MD;  Location: [facility];  Service: Endoscopy;  Laterality: N/A;  10:30am  . ESOPHAGOGASTRODUODENOSCOPY (EGD) WITH PROPOFOL N/A 12/23/2019   Procedure: ESOPHAGOGASTRODUODENOSCOPY (EGD) WITH PROPOFOL Possible esophageal variceal banding.;  Surgeon: Daneil Dolin, MD;  Location: [facility];  Service: Endoscopy;  Laterality: N/A;  .  HERNIA REPAIR Right    RIH  . INGUINAL HERNIA REPAIR Right 07/11/2017   Procedure: HERNIA REPAIR INGUINAL ADULT WITH MESH;  Surgeon: Aviva Signs, MD;  Location: [facility];  Service: General;  Laterality: Right;  patient knows to arrive at 7:30  . IR TRANSCATHETER BX  10/14/2017  . IR US GUIDE VASC ACCESS RIGHT  10/14/2017  . IR VENOGRAM HEPATIC WO HEMODYNAMIC EVALUATION  10/14/2017  . MALONEY DILATION N/A 05/03/2019   Procedure: Venia Minks DILATION;  Surgeon: Daneil Dolin, MD;  Location: [facility];  Service: Endoscopy;  Laterality: N/A;  . POLYPECTOMY  03/06/2017   Procedure: POLYPECTOMY;  Surgeon: Daneil Dolin, MD;  Location: [facility];  Service: Endoscopy;;  ascending colon;    Current Outpatient Medications  Medication Sig Dispense Refill  . Accu-Chek Softclix Lancets lancets Test BS 4 (four) times daily. Dx E11.69 (Patient taking differently: 1 each by Other route 4 (four) times daily as needed. Test BS 4 (four) times daily. Dx E11.69) 400 each 3  . atorvastatin (LIPITOR) 10 MG tablet TAKE 1 TABLET BY MOUTH  DAILY (Patient taking differently: Take 15 mg by mouth daily. ) 90 tablet 1  . blood glucose meter kit and supplies Dispense based on patient and insurance preference. Use up to four times daily as  directed. (FOR ICD-10 E10.9, E11.9). (Patient taking differently: 1 each by Other route once. Dispense based on patient and insurance preference. Use up to four times daily as directed. (FOR ICD-10 E10.9, E11.9).) 1 each 0  . Blood Glucose Monitoring Suppl (ACCU-CHEK GUIDE) w/Device KIT Test BS 4 (four) times daily. Dx E11.69 1 kit 0  . cetirizine (ZYRTEC) 10 MG tablet Take 1 tablet (10 mg total) by mouth daily. 30 tablet 11  . dapagliflozin propanediol (FARXIGA) 10 MG TABS tablet Take 1 tablet (10 mg total) by mouth daily before breakfast. 90 tablet 3  . dexlansoprazole (DEXILANT) 60 MG capsule Take 1 capsule (60 mg total) by mouth daily. 90 capsule 3  . escitalopram (LEXAPRO) 10 MG  tablet Take 1 tablet (10 mg total) by mouth daily. 90 tablet 0  . fluticasone (FLONASE) 50 MCG/ACT nasal spray Place 2 sprays into both nostrils daily. 16 g 6  . glucose blood (ACCU-CHEK GUIDE) test strip Test BS 4 (four) times daily. Dx E11.69 (Patient taking differently: 1 each by Other route 4 (four) times daily as needed. Test BS 4 (four) times daily. Dx E11.69) 400 each 3  . insulin degludec (TRESIBA FLEXTOUCH) 100 UNIT/ML FlexTouch Pen Inject 0.22 mLs (22 Units total) into the skin daily. 3 mL 1  . Insulin Pen Needle (B-D UF III MINI PEN NEEDLES) 31G X 5 MM MISC USE WITH TRESIBA daily Dx E11.69 (Patient taking differently: Inject 1 each into the skin daily. USE WITH TRESIBA daily Dx E11.69) 100 each 3  . metFORMIN (GLUCOPHAGE-XR) 750 MG 24 hr tablet TAKE 2 TABLETS BY MOUTH ONCE DAILY WITH BREAKFAST 180 tablet 0  . nadolol (CORGARD) 20 MG tablet Take 1 tablet (20 mg total) by mouth daily. 30 tablet 3  . ondansetron (ZOFRAN) 4 MG tablet Take 1 tablet (4 mg total) by mouth every 6 (six) hours as needed for nausea. 20 tablet 0  . spironolactone (ALDACTONE) 50 MG tablet Take 1 tablet (50 mg total) by mouth daily. 90 tablet 0  . VITAMIN D PO Take 1 capsule by mouth daily.      No current facility-administered medications for this visit.    Allergies as of 02/15/2020  . (No Known Allergies)    Family History  Problem Relation Age of Onset  . COPD Mother   . Liver disease Neg Hx   . Colon cancer Neg Hx   . Colon polyps Neg Hx     Social History   Socioeconomic History  . Marital status: Divorced    Spouse name: Not on file  . Number of children: Not on file  . Years of education: Not on file  . Highest education level: Not on file  Occupational History  . Occupation: unemployed    Comment: disability  Tobacco Use  . Smoking status: Never Smoker  . Smokeless tobacco: Never Used  Vaping Use  . Vaping Use: Never used  Substance and Sexual Activity  . Alcohol use: No    Comment:  Quit September 2017, heavy alcohol abuse previously  . Drug use: No  . Sexual activity: Not Currently    Partners: Female  Other Topics Concern  . Not on file  Social History Narrative   Lives in [place], [place]. 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    Drank alcohol for years, quit 05/07/16. Has been sober since then. Quit b/c of health/cirrhosis.   Divorced for over 15 years.   Dating sporadically.   Has 3 daughters  and 1 son live locally.   Originally from [place], moved here b/c of wife.    Eats all foods.    Wear seatbelt.   Does not drive, does not have a license, lost due to DWI.    Brought by neighbor.    Does not go to church. Believes in God. Prays each night, per report.    Social Determinants of Health   Financial Resource Strain:   . Difficulty of Paying Living Expenses:   Food Insecurity:   . Worried About Charity fundraiser in the Last Year:   . Arboriculturist in the Last Year:   Transportation Needs:   . Film/video editor (Medical):   Marland Kitchen Lack of Transportation (Non-Medical):   Physical Activity:   . Days of Exercise per Week:   . Minutes of Exercise per Session:   Stress:   . Feeling of Stress :   Social Connections:   . Frequency of Communication with Friends and Family:   . Frequency of Social Gatherings with Friends and Family:   . Attends Religious Services:   . Active Member of Clubs or Organizations:   . Attends Archivist Meetings:   Marland Kitchen Marital Status:     Subjective: Review of Systems  Constitutional: Negative for chills, fever, malaise/fatigue and weight loss.  HENT: Negative for congestion and sore throat.   Respiratory: Negative for cough and shortness of breath.   Cardiovascular: Negative for chest pain and palpitations.  Gastrointestinal: Positive for abdominal pain. Negative for blood in stool, diarrhea, melena, nausea and vomiting.       Chronic, right inguinal  Musculoskeletal: Negative for joint pain and  myalgias.  Skin: Negative for rash.  Neurological: Negative for dizziness and weakness.  Endo/Heme/Allergies: Does not bruise/bleed easily.  Psychiatric/Behavioral: Negative for depression. The patient is not nervous/anxious.   All other systems reviewed and are negative.    Objective: BP (!) 120/64   Pulse 73   Temp (!) 97 F (36.1 C)   Ht _0  (1.727 m)   Wt 186 lb 9.6 oz (84.6 kg)   BMI 28.37 kg/m  Physical Exam Vitals and nursing note reviewed.  Constitutional:      General: He is not in acute distress.    Appearance: Normal appearance. He is not ill-appearing, toxic-appearing or diaphoretic.  HENT:     Head: Normocephalic and atraumatic.     Nose: No congestion or rhinorrhea.  Eyes:     General: No scleral icterus. Cardiovascular:     Rate and Rhythm: Normal rate and regular rhythm.     Heart sounds: Normal heart sounds.  Pulmonary:     Effort: Pulmonary effort is normal.     Breath sounds: Normal breath sounds.  Abdominal:     General: Bowel sounds are normal. There is no distension.     Palpations: Abdomen is soft. There is no hepatomegaly, splenomegaly or mass.     Tenderness: There is no abdominal tenderness. There is no guarding or rebound.     Hernia: No hernia is present.  Musculoskeletal:     Cervical back: Neck supple.  Skin:    General: Skin is warm and dry.     Coloration: Skin is not jaundiced.     Findings: No bruising or rash.  Neurological:     General: No focal deficit present.     Mental Status: He is alert and oriented to person, place, and time. Mental status is at baseline.  Psychiatric:        Mood and Affect: Mood normal.        Behavior: Behavior normal.        Thought Content: Thought content normal.       02/15/2020 2:10 PM   Disclaimer: This note was dictated with voice recognition software. Similar sounding words can inadvertently be transcribed and may not be corrected upon review.

## 2020-02-15 NOTE — Progress Notes (Signed)
CC'ED TO PCP 

## 2020-02-15 NOTE — Assessment & Plan Note (Signed)
History of alcohol abuse, has been abstinent for about 4 years.  [facility] care has requested 6 months of alcohol cessation counseling to make him eligible for liver transplant evaluation, whenever it would be needed.  He is identified a facility near his home and is planning to go check it out shortly and try to enroll.

## 2020-02-15 NOTE — Assessment & Plan Note (Signed)
Dysphagia symptoms significantly improved.  Continue to monitor and notify us of any recurrence.

## 2020-02-15 NOTE — Patient Instructions (Signed)
Your health issues we discussed today were:   Cirrhosis:  1. Congratulations on your continued abstinence from alcohol!  It is imperative that continue to not drink 2. Continue your current medications as you have been 3. Try to eat a low-salt diet (less than 2000 mg a day) 4. Have your labs drawn when you are able to.  We will call you and get results 5. Continue taking your rifaximin (Xifaxan) and Aldactone (spironolactone) to help manage your condition 6. Call us if you notice any obvious or worsening symptoms. 7. Follow-up in 6 months for routine liver care  Abdominal pain in your right "inguinal" area: 1. I have ordered an ultrasound to look at his area specifically 2. As we discussed, your pain may be due to persistent scar tissue 3. We will call you when we get the results of your ultrasound 4. If they tell you to "drink 32 ounces of water prior to your exam" you do not need to pay attention to this.  I spoke to [place] (the ultrasound tech) who told me do not worry about drinking a lot of fluids prior to your exam  Overall I recommend:  1. Continue your other current medications 2. Return for follow-up in 6 months 3. Call us if you have any questions or concerns   ---------------------------------------------------------------  I am glad you have gotten your COVID-19 vaccination!  Even though you are fully vaccinated you should continue to follow CDC and state/local guidelines.  ---------------------------------------------------------------   At [facility] we value your feedback. You may receive a survey about your visit today. Please share your experience as we strive to create trusting relationships with our patients to provide genuine, compassionate, quality care.  We appreciate your understanding and patience as we review any laboratory studies, imaging, and other diagnostic tests that are ordered as we care for you. Our office policy is 5 business days for  review of these results, and any emergent or urgent results are addressed in a timely manner for your best interest. If you do not hear from our office in 1 week, please contact us.   We also encourage the use of MyChart, which contains your medical information for your review as well. If you are not enrolled in this feature, an access code is on this after visit summary for your convenience. Thank you for allowing [place] to be involved in your care.  It was great to see you today!  I hope you have a great Summer!!

## 2020-02-15 NOTE — Assessment & Plan Note (Signed)
History of chronic alcoholic cirrhosis, has been abstinent from alcohol for almost 4 years.  Noted grade 2 esophageal varices on most recent EGD noted to be "barely grade 2".  Remains on Corgard, heart rate not ideally controlled but it seems to be effective related to his varices.  No recurrent ascites or lower extremity edema, currently on  Aldactone 50 mg daily.  We will check electrolytes with routine liver labs.  Abdominal ultrasound up-to-date with CT of the abdomen and pelvis completed last month.  He is due for repeat liver labs, last meld score calculated at 7.  Further recommendations to follow liver labs.  Again recommended low-salt diet, continue to abstain from alcohol.  Follow-up in 6 months for routine liver care.

## 2020-02-16 ENCOUNTER — Other Ambulatory Visit (HOSPITAL_COMMUNITY)
Admission: RE | Admit: 2020-02-16 | Discharge: 2020-02-16 | Disposition: A | Discharge status: Home / Self Care | Payer: Medicare Other | Source: Ambulatory Visit | Attending: Nurse Practitioner | Admitting: Nurse Practitioner

## 2020-02-16 DIAGNOSIS — R1084 Generalized abdominal pain: Secondary | ICD-10-CM | POA: Insufficient documentation

## 2020-02-16 DIAGNOSIS — F1011 Alcohol abuse, in remission: Secondary | ICD-10-CM | POA: Insufficient documentation

## 2020-02-16 DIAGNOSIS — K7460 Unspecified cirrhosis of liver: Secondary | ICD-10-CM | POA: Diagnosis not present

## 2020-02-16 DIAGNOSIS — R1310 Dysphagia, unspecified: Secondary | ICD-10-CM | POA: Diagnosis not present

## 2020-02-16 DIAGNOSIS — K922 Gastrointestinal hemorrhage, unspecified: Secondary | ICD-10-CM | POA: Insufficient documentation

## 2020-02-16 LAB — COMPREHENSIVE METABOLIC PANEL
ALT: 31 U/L (ref 0–44)
AST: 30 U/L (ref 15–41)
Albumin: 4.3 g/dL (ref 3.5–5.0)
Alkaline Phosphatase: 56 U/L (ref 38–126)
Anion gap: 11 (ref 5–15)
BUN: 17 mg/dL (ref 6–20)
CO2: 21 mmol/L — ABNORMAL LOW (ref 22–32)
Calcium: 9.8 mg/dL (ref 8.9–10.3)
Chloride: 104 mmol/L (ref 98–111)
Creatinine, Ser: 1.1 mg/dL (ref 0.61–1.24)
GFR calc Af Amer: >60 mL/min (ref >60)
GFR calc non Af Amer: >60 mL/min (ref >60)
Glucose, Bld: 223 mg/dL — ABNORMAL HIGH (ref 70–99)
Potassium: 4.6 mmol/L (ref 3.5–5.1)
Sodium: 136 mmol/L (ref 135–145)
Total Bilirubin: 0.8 mg/dL (ref 0.3–1.2)
Total Protein: 7.8 g/dL (ref 6.5–8.1)

## 2020-02-16 LAB — CBC WITH DIFFERENTIAL/PLATELET
Abs Immature Granulocytes: 0 10*3/uL (ref 0.00–0.07)
Basophils Absolute: 0 10*3/uL (ref 0.0–0.1)
Basophils Relative: 0 %
Eosinophils Absolute: 0.1 10*3/uL (ref 0.0–0.5)
Eosinophils Relative: 2 %
HCT: 42.6 % (ref 39.0–52.0)
Hemoglobin: 14 g/dL (ref 13.0–17.0)
Immature Granulocytes: 0 %
Lymphocytes Relative: 41 %
Lymphs Abs: 2.6 10*3/uL (ref 0.7–4.0)
MCH: 29.9 pg (ref 26.0–34.0)
MCHC: 32.9 g/dL (ref 30.0–36.0)
MCV: 90.8 fL (ref 80.0–100.0)
Monocytes Absolute: 0.5 10*3/uL (ref 0.1–1.0)
Monocytes Relative: 7 %
Neutro Abs: 3.2 10*3/uL (ref 1.7–7.7)
Neutrophils Relative %: 50 %
Platelets: 172 10*3/uL (ref 150–400)
RBC: 4.69 MIL/uL (ref 4.22–5.81)
RDW: 13.1 % (ref 11.5–15.5)
WBC: 6.4 10*3/uL (ref 4.0–10.5)
nRBC: 0 % (ref 0.0–0.2)

## 2020-02-16 LAB — PROTIME-INR
INR: 1.1 (ref 0.8–1.2)
Prothrombin Time: 13.5 seconds (ref 11.4–15.2)

## 2020-02-17 LAB — AFP TUMOR MARKER: AFP, Serum, Tumor Marker: 1.9 ng/mL (ref 0.0–8.3)

## 2020-02-23 ENCOUNTER — Ambulatory Visit (HOSPITAL_COMMUNITY)
Admission: RE | Admit: 2020-02-23 | Discharge: 2020-02-23 | Disposition: A | Discharge status: Home / Self Care | Payer: Medicare Other | Source: Ambulatory Visit | Attending: Nurse Practitioner | Admitting: Nurse Practitioner

## 2020-02-23 ENCOUNTER — Other Ambulatory Visit: Payer: Self-pay

## 2020-02-23 DIAGNOSIS — K922 Gastrointestinal hemorrhage, unspecified: Secondary | ICD-10-CM | POA: Diagnosis not present

## 2020-02-23 DIAGNOSIS — F1011 Alcohol abuse, in remission: Secondary | ICD-10-CM | POA: Diagnosis present

## 2020-02-23 DIAGNOSIS — R1084 Generalized abdominal pain: Secondary | ICD-10-CM

## 2020-02-23 DIAGNOSIS — K7460 Unspecified cirrhosis of liver: Secondary | ICD-10-CM | POA: Diagnosis not present

## 2020-02-23 DIAGNOSIS — R1310 Dysphagia, unspecified: Secondary | ICD-10-CM | POA: Insufficient documentation

## 2020-02-23 DIAGNOSIS — R1319 Other dysphagia: Secondary | ICD-10-CM

## 2020-02-23 DIAGNOSIS — R1030 Lower abdominal pain, unspecified: Secondary | ICD-10-CM | POA: Diagnosis not present

## 2020-02-28 ENCOUNTER — Other Ambulatory Visit: Payer: Self-pay | Admitting: Family

## 2020-02-28 DIAGNOSIS — E1169 Type 2 diabetes mellitus with other specified complication: Secondary | ICD-10-CM

## 2020-02-29 ENCOUNTER — Ambulatory Visit: Payer: Medicare Other | Admitting: Nurse Practitioner

## 2020-03-14 ENCOUNTER — Ambulatory Visit: Payer: Medicare Other | Admitting: Pharmacist

## 2020-03-15 ENCOUNTER — Other Ambulatory Visit: Payer: Self-pay

## 2020-03-15 ENCOUNTER — Encounter: Payer: Self-pay | Admitting: Pharmacist

## 2020-03-15 ENCOUNTER — Ambulatory Visit (INDEPENDENT_AMBULATORY_CARE_PROVIDER_SITE_OTHER): Payer: Medicare Other | Admitting: Pharmacist

## 2020-03-15 VITALS — BP 115/76 | HR 57

## 2020-03-15 DIAGNOSIS — E119 Type 2 diabetes mellitus without complications: Secondary | ICD-10-CM | POA: Diagnosis not present

## 2020-03-15 MED ORDER — GABAPENTIN 300 MG PO CAPS
300.0000 mg | ORAL_CAPSULE | Freq: Every day | ORAL | 3 refills | Status: DC
Start: 2020-03-15 — End: 2020-07-13

## 2020-03-15 NOTE — Progress Notes (Signed)
    03/15/2020 Name: Thomas Pratt MRN: [id number] DOB: 14-Mar-1961   S:  61 yoF presents for diabetes evaluation, education, and management Patient was referred and last seen by Primary Care Provider on 01/06/20.  Insurance coverage/medication affordability: [employer] medicare/medicaid  Patient reports adherence with medications.  Current diabetes medications include: metformin, tresiba, farxiga  Current hypertension medications include: nadolol, spironolactone Goal 130/80  Current hyperlipidemia medications include: atorvastatin   Patient denies hypoglycemic events.   Patient reported dietary habits: Eats 2-3 meals/day Discussed meal planning options and Plate method for healthy eating -sees dietician in [place]  Avoid sugary drinks and desserts  Incorporate balanced protein, non starchy veggies, 1 serving of carbohydrate with each meal  Increase water intake  Increase physical activity as able   Patient-reported exercise habits: n/a, hernia repair is causing him pain  Patient reports nocturia (nighttime urination).  Patient reports neuropathy (nerve pain).  Patient reports visual changes. Follows with eye doctor  Patient reports self foot exams.    O:  Lab Results  Component Value Date   HGBA1C 8.6 (H) 12/21/2019    Vitals:   03/15/20 0949  BP: 115/76  Pulse: (!) 57     Lipid Panel     Component Value Date/Time   CHOL 181 05/20/2019 1452   TRIG 193 (H) 05/20/2019 1452   HDL 45 05/20/2019 1452   CHOLHDL 4.0 05/20/2019 1452   CHOLHDL 3.4 09/26/2017 1101   LDLCALC 103 (H) 05/20/2019 1452   LDLCALC 108 (H) 09/26/2017 1101    Home fasting blood sugars: 100s, 113, 190   2 hour post-meal/random blood sugars: highest 200. After breakfast today 193   A/P:  DiabetesT2DMcurrently UNCONTROLLED. Patient is able to verbalize appropriate hypoglycemia management plan. Patientisadherent with medication. Control is suboptimal due to  DIET.  -Continuedbasal insulin TRESIBA(insulin DEGLUDEC)--dose increased to 22 units nightly  -CONTINUE metformin  -CONTINUE Farxiga 10mg  daily  CounseledSick day rules: ifsick, vomiting, have diarrhea, or cannot drink enough fluids, you should stop taking SGLT-2 inhibitors until your symptoms go away. In rare cases, these medicines can cause diabetic ketoacidosis (DKA). DKA is acid buildup in the blood.  Reports drinking more water  -Started gabapentin 300mg  qHS for neuropathy   -Extensively discussed pathophysiology of diabetes, recommended lifestyle interventions, dietary effects on blood sugar control  -Counseled on s/sx of and management of hypoglycemia  -Next A1C anticipated 04/12/20.   Written patient instructions provided.  Total time in face to face counseling 30 minutes.   Follow up PCP Clinic Visit ON 04/12/20.    Regina Eck, PharmD, BCPS Clinical Pharmacist, [place]  [facility] Phone [phone]

## 2020-04-04 ENCOUNTER — Other Ambulatory Visit: Payer: Self-pay | Admitting: Family

## 2020-04-12 ENCOUNTER — Ambulatory Visit (INDEPENDENT_AMBULATORY_CARE_PROVIDER_SITE_OTHER): Payer: Medicare Other | Admitting: Family

## 2020-04-12 ENCOUNTER — Other Ambulatory Visit: Payer: Self-pay

## 2020-04-12 ENCOUNTER — Telehealth: Payer: Self-pay | Admitting: Pharmacist

## 2020-04-12 ENCOUNTER — Encounter: Payer: Self-pay | Admitting: Family

## 2020-04-12 VITALS — BP 94/63 | HR 58 | Temp 97.6°F | Ht 68.0 in | Wt 188.6 lb

## 2020-04-12 DIAGNOSIS — E119 Type 2 diabetes mellitus without complications: Secondary | ICD-10-CM | POA: Diagnosis not present

## 2020-04-12 DIAGNOSIS — F1011 Alcohol abuse, in remission: Secondary | ICD-10-CM

## 2020-04-12 DIAGNOSIS — F320 Major depressive disorder, single episode, mild: Secondary | ICD-10-CM

## 2020-04-12 DIAGNOSIS — Z23 Encounter for immunization: Secondary | ICD-10-CM | POA: Diagnosis not present

## 2020-04-12 DIAGNOSIS — I10 Essential (primary) hypertension: Secondary | ICD-10-CM

## 2020-04-12 DIAGNOSIS — E1169 Type 2 diabetes mellitus with other specified complication: Secondary | ICD-10-CM

## 2020-04-12 DIAGNOSIS — K7030 Alcoholic cirrhosis of liver without ascites: Secondary | ICD-10-CM

## 2020-04-12 LAB — BMP8+EGFR
BUN/Creatinine Ratio: 11 (ref 10–24)
BUN: 10 mg/dL (ref 8–27)
CO2: 26 mmol/L (ref 20–29)
Calcium: 9.3 mg/dL (ref 8.6–10.2)
Chloride: 103 mmol/L (ref 96–106)
Creatinine, Ser: 0.94 mg/dL (ref 0.76–1.27)
GFR calc Af Amer: 101 mL/min/{1.73_m2} (ref >59)
GFR calc non Af Amer: 88 mL/min/{1.73_m2} (ref >59)
Glucose: 105 mg/dL — ABNORMAL HIGH (ref 65–99)
Potassium: 4.1 mmol/L (ref 3.5–5.2)
Sodium: 139 mmol/L (ref 134–144)

## 2020-04-12 LAB — BAYER DCA HB A1C WAIVED: HB A1C (BAYER DCA - WAIVED): 8.2 % — ABNORMAL HIGH (ref <7.0)

## 2020-04-12 MED ORDER — TRESIBA FLEXTOUCH 100 UNIT/ML ~~LOC~~ SOPN: 25.0000 [IU] | PEN_INJECTOR | Freq: Every day | SUBCUTANEOUS | 0 refills | Status: DC

## 2020-04-12 MED ORDER — FLUTICASONE PROPIONATE 50 MCG/ACT NA SUSP: 2.0000 | Freq: Every day | NASAL | 6 refills | Status: DC

## 2020-04-12 MED ORDER — METFORMIN HCL ER 750 MG PO TB24: ORAL_TABLET | ORAL | 0 refills | Status: DC

## 2020-04-12 MED ORDER — SPIRONOLACTONE 50 MG PO TABS: 50.0000 mg | ORAL_TABLET | Freq: Every day | ORAL | 0 refills | Status: DC

## 2020-04-12 MED ORDER — TRESIBA FLEXTOUCH 100 UNIT/ML ~~LOC~~ SOPN: 18.0000 [IU] | PEN_INJECTOR | Freq: Every day | SUBCUTANEOUS | 0 refills | Status: DC

## 2020-04-12 NOTE — Telephone Encounter (Signed)
increase tresiba to 25 units Has been out of metformin--now has refills, encouraged patient to take AVG 14 days 147 FBG 107

## 2020-04-12 NOTE — Patient Instructions (Signed)

## 2020-04-12 NOTE — Progress Notes (Signed)
Subjective:    Patient ID: Thomas Pratt, male    DOB: 1961-03-31, 59 y.o.   MRN: [id number]  Chief Complaint  Patient presents with   Hypertension   Diabetes   PT presents to the office today for chronic follow up. He is followed by our clinical pharmacists  for DM education.  He is followed by GI  for cirrhosis of liver. He has been sober since 03/2016 and needs to get counseling program to get on transplant list. PT is stable at this time. Hypertension This is a chronic problem. The current episode started more than 1 year ago. The problem has been resolved since onset. The problem is controlled. Associated symptoms include malaise/fatigue and peripheral edema. Pertinent negatives include no blurred vision or shortness of breath. Risk factors for coronary artery disease include obesity, male gender, sedentary lifestyle, dyslipidemia and diabetes mellitus. The current treatment provides moderate improvement. There is no history of CVA.  Diabetes He presents for his follow-up diabetic visit. He has type 2 diabetes mellitus. His disease course has been stable. There are no hypoglycemic associated symptoms. Associated symptoms include foot paresthesias. Pertinent negatives for diabetes include no blurred vision. Symptoms are stable. Diabetic complications include peripheral neuropathy. Pertinent negatives for diabetic complications include no CVA or heart disease. Risk factors for coronary artery disease include sedentary lifestyle, hypertension, diabetes mellitus and dyslipidemia. He is following a generally healthy diet. His overall blood glucose range is 130-140 mg/dl. Eye exam is current.  Gastroesophageal Reflux He complains of belching and heartburn. This is a chronic problem. The current episode started more than 1 year ago. The problem occurs occasionally. The problem has been waxing and waning. He has tried a PPI for the symptoms. The treatment provided moderate relief.       Review of Systems  Constitutional: Positive for malaise/fatigue.  Eyes: Negative for blurred vision.  Respiratory: Negative for shortness of breath.   Gastrointestinal: Positive for heartburn.  All other systems reviewed and are negative.      Objective:   Physical Exam Vitals reviewed.  Constitutional:      General: He is not in acute distress.    Appearance: He is well-developed.  HENT:     Head: Normocephalic.     Right Ear: External ear normal.     Left Ear: External ear normal.  Eyes:     General:        Right eye: No discharge.        Left eye: No discharge.     Pupils: Pupils are equal, round, and reactive to light.  Neck:     Thyroid: No thyromegaly.  Cardiovascular:     Rate and Rhythm: Normal rate and regular rhythm.     Heart sounds: Normal heart sounds. No murmur heard.   Pulmonary:     Effort: Pulmonary effort is normal. No respiratory distress.     Breath sounds: Normal breath sounds. No wheezing.  Abdominal:     General: Bowel sounds are normal. There is no distension.     Palpations: Abdomen is soft.     Tenderness: There is no abdominal tenderness.  Musculoskeletal:        General: No tenderness. Normal range of motion.     Cervical back: Normal range of motion and neck supple.  Skin:    General: Skin is warm and dry.     Findings: No erythema or rash.  Neurological:     Mental Status: He is alert and oriented to  person, place, and time.     Cranial Nerves: No cranial nerve deficit.     Deep Tendon Reflexes: Reflexes are normal and symmetric.  Psychiatric:        Behavior: Behavior normal.        Thought Content: Thought content normal.        Judgment: Judgment normal.         BP 94/63    Pulse (!) 58    Temp 97.6 F (36.4 C) (Temporal)    Ht '5\' 8"'  (1.727 m)    Wt 188 lb 9.6 oz (85.5 kg)    SpO2 97%    BMI 28.68 kg/m   Assessment & Plan:  Doyne Micke comes in today with chief complaint of Hypertension and  Diabetes   Diagnosis and orders addressed:  1. Type 2 diabetes mellitus without complication, without long-term current use of insulin (HCC) - Bayer DCA Hb A1c Waived - Microalbumin / creatinine urine ratio - BMP8+EGFR  2. Essential hypertension - spironolactone (ALDACTONE) 50 MG tablet; Take 1 tablet (50 mg total) by mouth daily.  Dispense: 90 tablet; Refill: 0 - BMP8+EGFR  3. Type 2 diabetes mellitus with other specified complication, unspecified whether long term insulin use (HCC) Will increase Tresiba to 25 units from 22 units  - metFORMIN (GLUCOPHAGE-XR) 750 MG 24 hr tablet; TAKE 2 TABLETS BY MOUTH ONCE DAILY WITH BREAKFAST  Dispense: 180 tablet; Refill: 0 - BMP8+EGFR - insulin degludec (TRESIBA FLEXTOUCH) 100 UNIT/ML FlexTouch Pen; Inject 25 Units into the skin at bedtime.  Dispense: 15 mL; Refill: 0  4. Need for immunization against influenza - Flu Vaccine QUAD 36+ mos IM - BMP8+EGFR  5. Alcoholic cirrhosis of liver without ascites (HCC) - BMP8+EGFR  6. History of ETOH abuse - BMP8+EGFR  7. Depression, major, single episode, mild (HCC) - BMP8+EGFR  8. Type 2 diabetes mellitus with other specified complication, without long-term current use of insulin (HCC) - insulin degludec (TRESIBA FLEXTOUCH) 100 UNIT/ML FlexTouch Pen; Inject 25 Units into the skin at bedtime.  Dispense: 15 mL; Refill: 0   Labs pending Health Maintenance reviewed Diet and exercise encouraged  Follow up plan: 3 months and follow up with Pilar Plate, FNP

## 2020-04-13 LAB — MICROALBUMIN / CREATININE URINE RATIO
Creatinine, Urine: 32.3 mg/dL
Microalb/Creat Ratio: <9 mg/g creat (ref 0–29)
Microalbumin, Urine: <3 ug/mL

## 2020-04-14 ENCOUNTER — Telehealth: Payer: Self-pay | Admitting: Family

## 2020-04-14 NOTE — Telephone Encounter (Signed)
Pt aware of results and voiced understanding. 

## 2020-04-15 IMAGING — US US ABDOMEN LIMITED
1 series · 14 of 25 positions shown · non-contrast
Comparison: March 20, 2019

CLINICAL DATA: Hepatic cirrhosis

EXAM:
ULTRASOUND ABDOMEN LIMITED RIGHT UPPER QUADRANT

[Series 1: us abdomen limited · 14 of 27 slices shown]
[im 1/27]
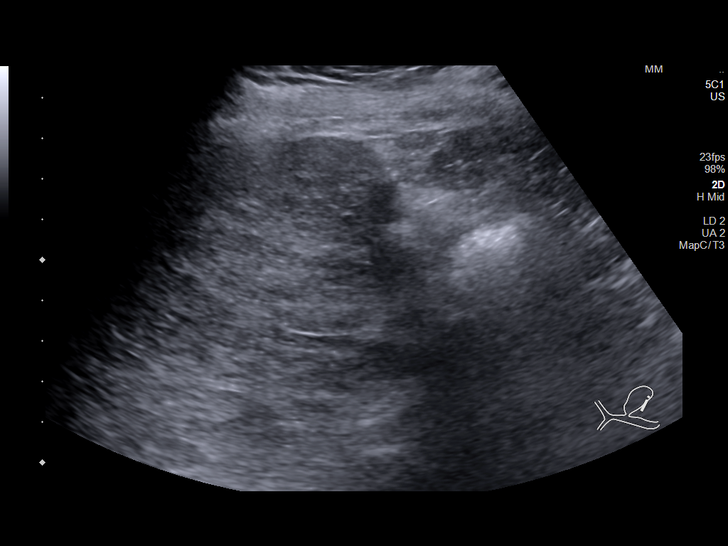
[im 3/27]
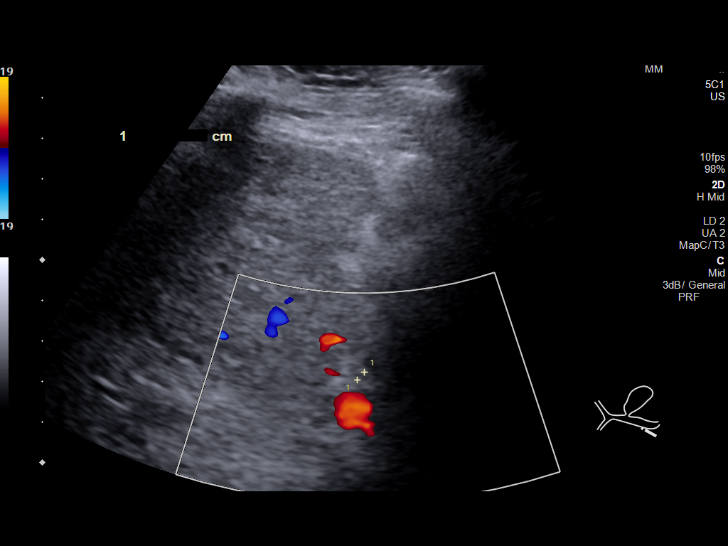
[im 5/27]
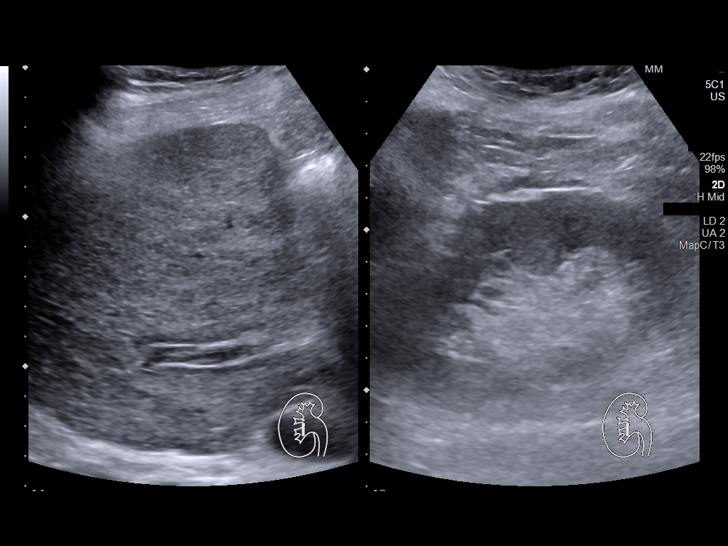
[im 7/27]
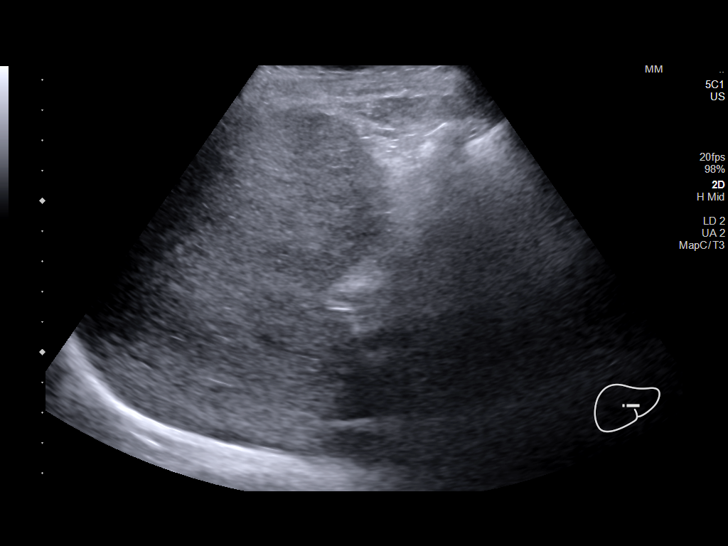
[im 9/27]
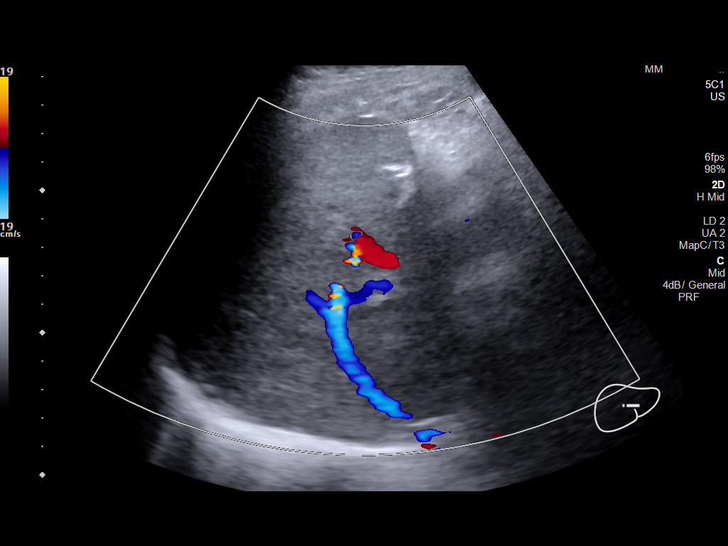
[im 10/27]
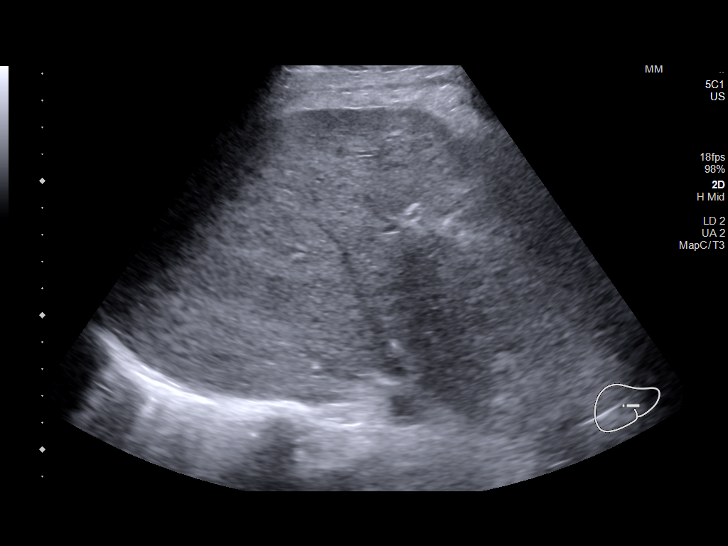
[im 12/27]
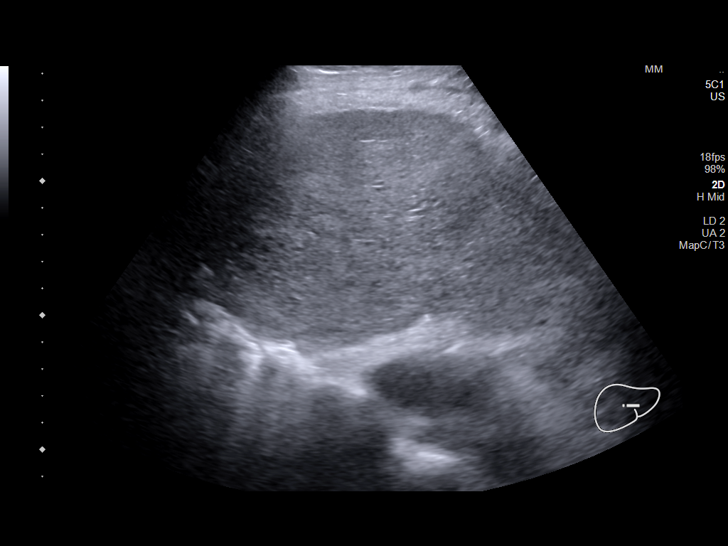
[im 15/27]
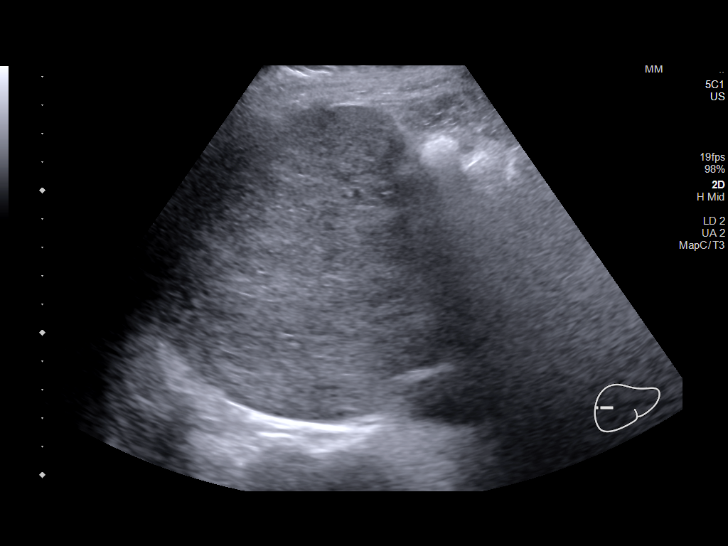
[im 17/27]
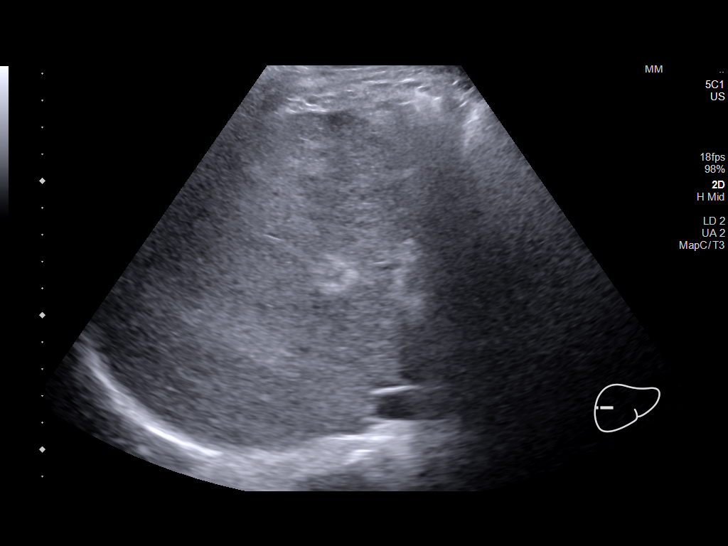
[im 18/27]
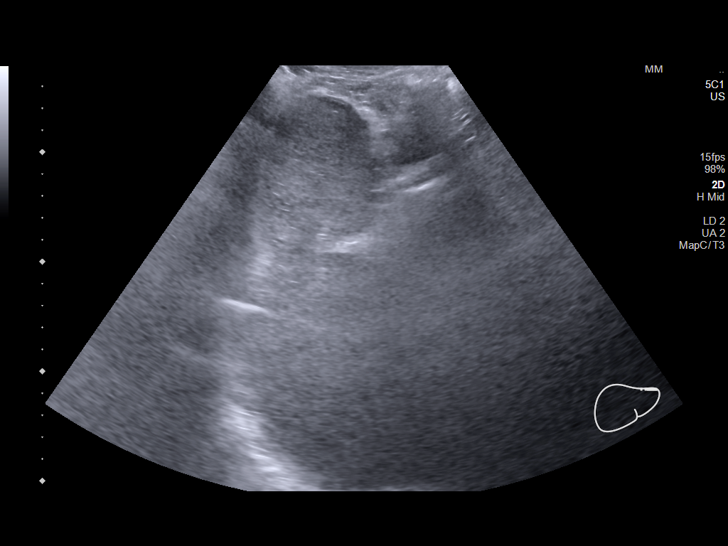
[im 20/27]
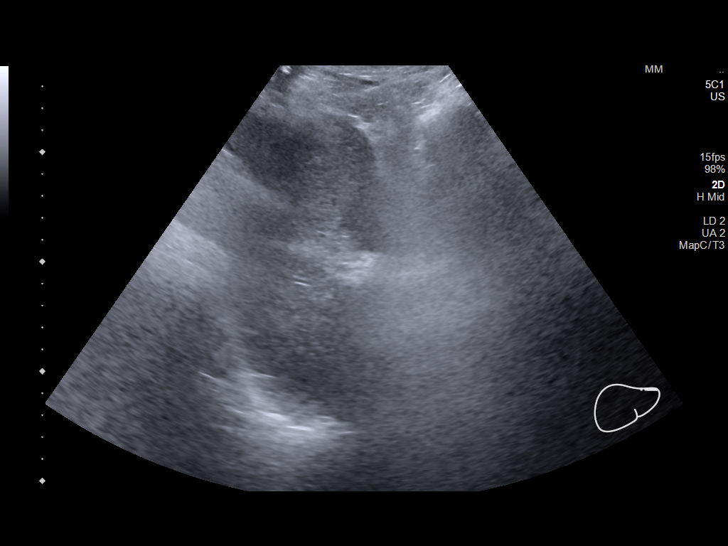
[im 22/27]
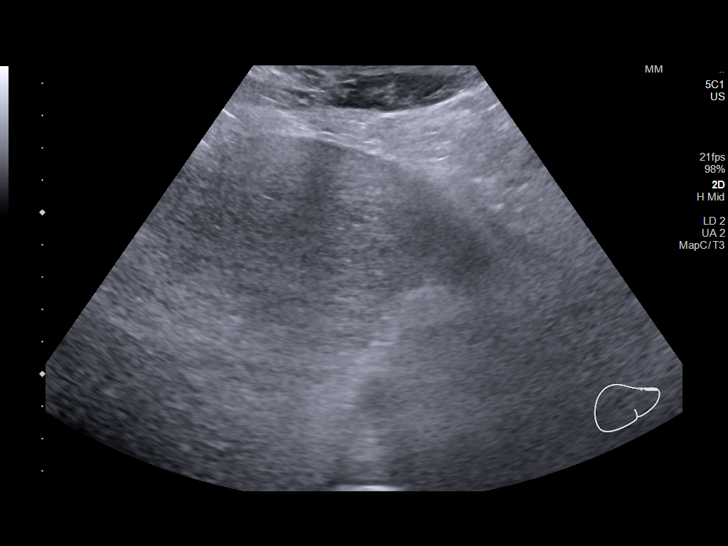
[im 24/27]
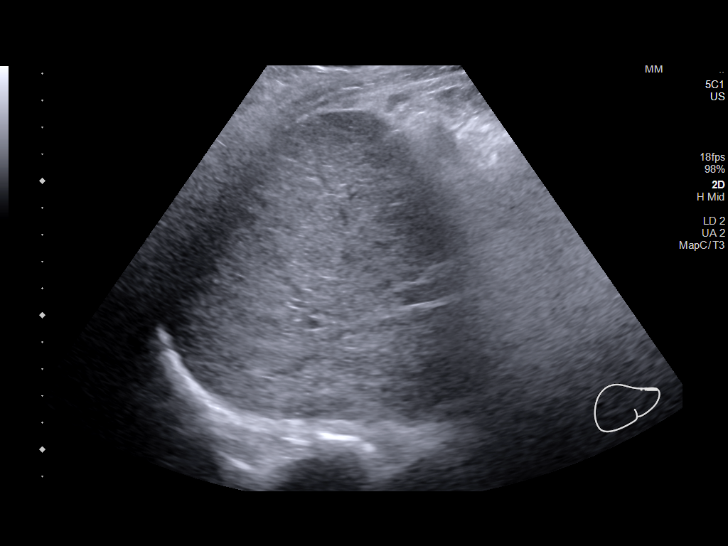
[im 27/27]
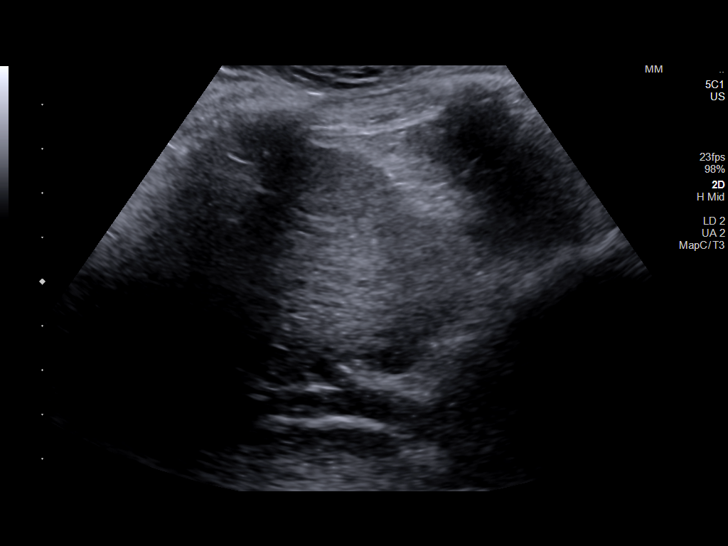

[14 of 25 positions shown; findings below may reference images not displayed]

FINDINGS: Gallbladder:

Surgically absent.

Common bile duct:

Diameter: 3 mm. No intrahepatic or extrahepatic biliary duct
dilatation.

Liver:

No focal lesion identified. Liver echogenicity is diffusely
increased and somewhat coarsened and inhomogeneous in appearance.
Portal vein is patent on color Doppler imaging with normal direction
of blood flow towards the liver.

Other: None.
IMPRESSION: 1. Liver echogenicity is diffusely increased and somewhat
inhomogeneous and coarsened. This appearance is indicative of
underlying parenchymal liver disease with likely superimposed
hepatic steatosis. While no focal liver lesions are evident on this
study, it must be cautioned that the sensitivity of ultrasound for
detection of focal liver lesions is diminished in this circumstance.

2.  Gallbladder absent.

## 2020-04-24 ENCOUNTER — Telehealth: Payer: Self-pay

## 2020-04-24 NOTE — Telephone Encounter (Signed)
Pt informed spironolactone, Tyler Aas & Fluticasone were sent in on 04/12/20, he will contact [facility]

## 2020-04-24 NOTE — Telephone Encounter (Signed)
  Prescription Request  04/24/2020  What is the name of the medication or equipment? 3 RX's-not sure  Have you contacted your pharmacy to request a refill? (if applicable) Yes  Which pharmacy would you like this sent to? [facility]   Patient notified that their request is being sent to the clinical staff for review and that they should receive a response within 2 business days.   Lenna Gilford' pt.  Meds never got sent in.  Please call pt.

## 2020-05-10 ENCOUNTER — Other Ambulatory Visit: Payer: Self-pay | Admitting: *Deleted

## 2020-05-10 ENCOUNTER — Telehealth: Payer: Self-pay

## 2020-05-10 MED ORDER — NADOLOL 20 MG PO TABS: 20.0000 mg | ORAL_TABLET | Freq: Every day | ORAL | 4 refills | Status: DC

## 2020-05-23 ENCOUNTER — Telehealth: Payer: Self-pay

## 2020-05-23 MED ORDER — NYSTATIN 100000 UNIT/GM EX CREA: 1.0000 "application " | TOPICAL_CREAM | Freq: Two times a day (BID) | CUTANEOUS | 0 refills | Status: DC

## 2020-05-23 MED ORDER — FLUCONAZOLE 150 MG PO TABS
150.0000 mg | ORAL_TABLET | ORAL | 0 refills | Status: DC | PRN
Start: 2020-05-23 — End: 2020-07-13

## 2020-05-23 NOTE — Telephone Encounter (Signed)
Lmtcb.

## 2020-05-23 NOTE — Telephone Encounter (Signed)
Nystatin and diflucan Prescription sent to pharmacy

## 2020-05-23 NOTE — Telephone Encounter (Signed)
Patient states he burns when he urinates. White stuff around head of penis, and it is swelling has been going on for 3-4 days.

## 2020-05-30 ENCOUNTER — Other Ambulatory Visit: Payer: Self-pay | Admitting: Family

## 2020-07-13 ENCOUNTER — Other Ambulatory Visit: Payer: Self-pay

## 2020-07-13 ENCOUNTER — Encounter: Payer: Self-pay | Admitting: Family

## 2020-07-13 ENCOUNTER — Ambulatory Visit (INDEPENDENT_AMBULATORY_CARE_PROVIDER_SITE_OTHER): Payer: Medicare Other | Admitting: Family

## 2020-07-13 VITALS — BP 99/63 | HR 57 | Temp 97.0°F | Ht 68.0 in | Wt 187.0 lb

## 2020-07-13 DIAGNOSIS — K7460 Unspecified cirrhosis of liver: Secondary | ICD-10-CM

## 2020-07-13 DIAGNOSIS — E1169 Type 2 diabetes mellitus with other specified complication: Secondary | ICD-10-CM

## 2020-07-13 DIAGNOSIS — F320 Major depressive disorder, single episode, mild: Secondary | ICD-10-CM | POA: Diagnosis not present

## 2020-07-13 DIAGNOSIS — K219 Gastro-esophageal reflux disease without esophagitis: Secondary | ICD-10-CM

## 2020-07-13 DIAGNOSIS — F1011 Alcohol abuse, in remission: Secondary | ICD-10-CM

## 2020-07-13 DIAGNOSIS — E1149 Type 2 diabetes mellitus with other diabetic neurological complication: Secondary | ICD-10-CM

## 2020-07-13 DIAGNOSIS — E785 Hyperlipidemia, unspecified: Secondary | ICD-10-CM

## 2020-07-13 DIAGNOSIS — R1319 Other dysphagia: Secondary | ICD-10-CM

## 2020-07-13 DIAGNOSIS — I10 Essential (primary) hypertension: Secondary | ICD-10-CM

## 2020-07-13 LAB — BAYER DCA HB A1C WAIVED: HB A1C (BAYER DCA - WAIVED): 6.7 % (ref <7.0)

## 2020-07-13 MED ORDER — ATORVASTATIN CALCIUM 10 MG PO TABS: 10.0000 mg | ORAL_TABLET | Freq: Every day | ORAL | 0 refills | Status: DC

## 2020-07-13 MED ORDER — DEXILANT 60 MG PO CPDR: 1.0000 | DELAYED_RELEASE_CAPSULE | Freq: Every day | ORAL | 3 refills | Status: DC

## 2020-07-13 MED ORDER — GABAPENTIN 300 MG PO CAPS: 300.0000 mg | ORAL_CAPSULE | Freq: Every day | ORAL | 3 refills | Status: DC

## 2020-07-13 MED ORDER — TRESIBA FLEXTOUCH 100 UNIT/ML ~~LOC~~ SOPN: 25.0000 [IU] | PEN_INJECTOR | Freq: Every day | SUBCUTANEOUS | 3 refills | Status: DC

## 2020-07-13 MED ORDER — METFORMIN HCL ER 750 MG PO TB24: ORAL_TABLET | ORAL | 0 refills | Status: DC

## 2020-07-13 NOTE — Patient Instructions (Signed)

## 2020-07-13 NOTE — Progress Notes (Signed)
Subjective:    Patient ID: Thomas Pratt, male    DOB: 1960/10/10, 59 y.o.   MRN: [id number]  Chief Complaint  Patient presents with   Diabetes    3 month follow up    PT presents to the office today for chronic follow up. He is followed by our clinical pharmacists for DM education.  Heis followed by GIfor cirrhosis of liver. He has been sober since 09/2017and needs to get counseling program to get on transplant list. PT is stable at this time. Diabetes He presents for his follow-up diabetic visit. He has type 2 diabetes mellitus. His disease course has been stable. There are no hypoglycemic associated symptoms. Associated symptoms include foot paresthesias. Pertinent negatives for diabetes include no blurred vision. Symptoms are stable. Diabetic complications include peripheral neuropathy. Pertinent negatives for diabetic complications include no CVA, heart disease or nephropathy. Risk factors for coronary artery disease include dyslipidemia, diabetes mellitus, male sex, hypertension, sedentary lifestyle and post-menopausal. He is following a generally healthy diet. His overall blood glucose range is 140-180 mg/dl.  Hypertension This is a chronic problem. The current episode started more than 1 year ago. The problem has been resolved since onset. The problem is controlled. Pertinent negatives include no blurred vision or malaise/fatigue. There is no history of CVA.  Hyperlipidemia This is a chronic problem. The current episode started more than 1 year ago. Exacerbating diseases include obesity. Current antihyperlipidemic treatment includes statins. The current treatment provides moderate improvement of lipids. Risk factors for coronary artery disease include dyslipidemia, male sex, hypertension and a sedentary lifestyle.  Gastroesophageal Reflux This is a chronic problem. The current episode started more than 1 year ago. The problem occurs occasionally. The problem has been waxing  and waning. The symptoms are aggravated by certain foods. Risk factors include obesity. He has tried a PPI for the symptoms. The treatment provided moderate relief.  Depression        This is a chronic problem.  The current episode started more than 1 year ago.   The onset quality is gradual.   The problem occurs intermittently.  The problem has been waxing and waning since onset.  Diabetic Neuropathy Pt states he has intermittent sharp pain that is 7-10 out 10. He takes gabapentin that helps slightly.    Review of Systems  Constitutional: Negative for malaise/fatigue.  Eyes: Negative for blurred vision.  Psychiatric/Behavioral: Positive for depression.  All other systems reviewed and are negative.      Objective:   Physical Exam Vitals reviewed.  Constitutional:      General: He is not in acute distress.    Appearance: He is well-developed and well-nourished.  HENT:     Head: Normocephalic.     Right Ear: External ear normal.     Left Ear: External ear normal.     Mouth/Throat:     Mouth: Oropharynx is clear and moist.  Eyes:     General:        Right eye: No discharge.        Left eye: No discharge.     Pupils: Pupils are equal, round, and reactive to light.  Neck:     Thyroid: No thyromegaly.  Cardiovascular:     Rate and Rhythm: Normal rate and regular rhythm.     Pulses: Intact distal pulses.     Heart sounds: Normal heart sounds. No murmur heard.   Pulmonary:     Effort: Pulmonary effort is normal. No respiratory distress.  Breath sounds: Normal breath sounds. No wheezing.  Abdominal:     General: Bowel sounds are normal. There is no distension.     Palpations: Abdomen is soft.     Tenderness: There is no abdominal tenderness.  Musculoskeletal:        General: No tenderness or edema. Normal range of motion.     Cervical back: Normal range of motion and neck supple.  Skin:    General: Skin is warm and dry.     Findings: No erythema or rash.  Neurological:      Mental Status: He is alert and oriented to person, place, and time.     Cranial Nerves: No cranial nerve deficit.     Deep Tendon Reflexes: Reflexes are normal and symmetric.  Psychiatric:        Mood and Affect: Mood and affect normal.        Behavior: Behavior normal.        Thought Content: Thought content normal.        Judgment: Judgment normal.       BP 99/63    Pulse (!) 57    Temp (!) 97 F (36.1 C) (Temporal)    Ht _0  (1.727 m)    Wt 187 lb (84.8 kg)    SpO2 98%    BMI 28.43 kg/m      Assessment & Plan:  Thomas Pratt comes in today with chief complaint of Diabetes (3 month follow up/)   Diagnosis and orders addressed:  1. Type 2 diabetes mellitus with other specified complication, unspecified whether long term insulin use (HCC) - Bayer DCA Hb A1c Waived - BMP8+EGFR - Hepatic function panel - insulin degludec (TRESIBA FLEXTOUCH) 100 UNIT/ML FlexTouch Pen; Inject 25 Units into the skin at bedtime.  Dispense: 15 mL; Refill: 3 - metFORMIN (GLUCOPHAGE-XR) 750 MG 24 hr tablet; TAKE 2 TABLETS BY MOUTH ONCE DAILY WITH BREAKFAST  Dispense: 180 tablet; Refill: 0  2. Type 2 diabetes mellitus with other specified complication, without long-term current use of insulin (HCC) - Bayer DCA Hb A1c Waived - BMP8+EGFR - Hepatic function panel - atorvastatin (LIPITOR) 10 MG tablet; Take 1 tablet (10 mg total) by mouth daily.  Dispense: 90 tablet; Refill: 0 - insulin degludec (TRESIBA FLEXTOUCH) 100 UNIT/ML FlexTouch Pen; Inject 25 Units into the skin at bedtime.  Dispense: 15 mL; Refill: 3  3. Primary hypertension - BMP8+EGFR - Hepatic function panel  4. Cirrhosis of liver without ascites, unspecified hepatic cirrhosis type (HCC) - BMP8+EGFR - Hepatic function panel  5. History of ETOH abuse - BMP8+EGFR - Hepatic function panel  6. Depression, major, single episode, mild (HCC) - BMP8+EGFR - Hepatic function panel  7. Gastroesophageal reflux disease, unspecified  whether esophagitis present - BMP8+EGFR - Hepatic function panel - dexlansoprazole (DEXILANT) 60 MG capsule; Take 1 capsule (60 mg total) by mouth daily.  Dispense: 90 capsule; Refill: 3  8. Esophageal dysphagia  9. Hyperlipidemia associated with type 2 diabetes mellitus (HCC) - atorvastatin (LIPITOR) 10 MG tablet; Take 1 tablet (10 mg total) by mouth daily.  Dispense: 90 tablet; Refill: 0  10. Other diabetic neurological complication associated with type 2 diabetes mellitus (HCC) - gabapentin (NEURONTIN) 300 MG capsule; Take 1 capsule (300 mg total) by mouth at bedtime.  Dispense: 30 capsule; Refill: 3   Labs pending Health Maintenance reviewed Diet and exercise encouraged  Follow up plan: 3 months   Evelina Dun, FNP

## 2020-07-14 LAB — HEPATIC FUNCTION PANEL
ALT: 28 IU/L (ref 0–44)
AST: 24 IU/L (ref 0–40)
Albumin: 4.3 g/dL (ref 3.8–4.9)
Alkaline Phosphatase: 75 IU/L (ref 44–121)
Bilirubin Total: 0.4 mg/dL (ref 0.0–1.2)
Bilirubin, Direct: 0.14 mg/dL (ref 0.00–0.40)
Total Protein: 7.4 g/dL (ref 6.0–8.5)

## 2020-07-14 LAB — BMP8+EGFR
BUN/Creatinine Ratio: 16 (ref 10–24)
BUN: 14 mg/dL (ref 8–27)
CO2: 23 mmol/L (ref 20–29)
Calcium: 10.2 mg/dL (ref 8.6–10.2)
Chloride: 98 mmol/L (ref 96–106)
Creatinine, Ser: 0.87 mg/dL (ref 0.76–1.27)
GFR calc Af Amer: 108 mL/min/{1.73_m2} (ref >59)
GFR calc non Af Amer: 94 mL/min/{1.73_m2} (ref >59)
Glucose: 141 mg/dL — ABNORMAL HIGH (ref 65–99)
Potassium: 4.7 mmol/L (ref 3.5–5.2)
Sodium: 138 mmol/L (ref 134–144)

## 2020-08-03 ENCOUNTER — Telehealth: Payer: Self-pay | Admitting: *Deleted

## 2020-08-03 NOTE — Chronic Care Management (AMB) (Signed)
  Chronic Care Management   Outreach Note  08/03/2020 Name: Thomas Pratt MRN: [id number] DOB: 11/17/1960  Thomas Pratt is a 60 y.o. year old male who is a primary care patient of Sharion Balloon, FNP. I reached out to Thomas Pratt by phone today in response to a referral sent by Thomas Pratt health plan.     An unsuccessful telephone outreach was attempted today. The patient was referred to the case management team for assistance with care management and care coordination.   Follow Up Plan: The care management team will reach out to the patient again over the next 7 days. If patient returns call to provider office, please advise to call [facility] at [phone].  [place]

## 2020-08-09 NOTE — Chronic Care Management (AMB) (Signed)
  Chronic Care Management   Outreach Note  08/09/2020 Name: Davari Lopes MRN: [id number] DOB: 04/25/1961  Thomas Pratt is a 60 y.o. year old male who is a primary care patient of Sharion Balloon, FNP. I reached out to [name] by phone today in response to a referral sent by Mr. [employer] health plan.     A second unsuccessful telephone outreach was attempted today. The patient was referred to the case management team for assistance with care management and care coordination.   Follow Up Plan: A HIPAA compliant phone message was left for the patient providing contact information and requesting a return call. The care management team will reach out to the patient again over the next 7 days. If patient returns call to provider office, please advise to call [place] at [phone].  [place]

## 2020-08-14 LAB — HM DIABETES EYE EXAM

## 2020-08-14 NOTE — Chronic Care Management (AMB) (Signed)
  Chronic Care Management   Outreach Note  08/14/2020 Name: Thomas Pratt MRN: [id number] DOB: February 18, 1961  Thomas Pratt is a 60 y.o. year old male who is a primary care patient of Sharion Balloon, FNP. I reached out to Thomas Pratt by phone today in response to a referral sent by [name] health plan.     Third unsuccessful telephone outreach was attempted today. The patient was referred to the case management team for assistance with care management and care coordination. The patient's primary care provider has been notified of our unsuccessful attempts to make or maintain contact with the patient. The care management team is pleased to engage with this patient at any time in the future should he/she be interested in assistance from the care management team.   Follow Up Plan: We have been unable to make contact with the patient. The care management team is available to follow up with the patient after provider conversation with the patient regarding recommendation for care management engagement and subsequent re-referral to the care management team.   [facility]  Direct Dial: [phone]

## 2020-08-15 DIAGNOSIS — H5213 Myopia, bilateral: Secondary | ICD-10-CM | POA: Diagnosis not present

## 2020-08-17 ENCOUNTER — Ambulatory Visit (INDEPENDENT_AMBULATORY_CARE_PROVIDER_SITE_OTHER): Payer: Medicare Other | Admitting: Nurse Practitioner

## 2020-08-17 ENCOUNTER — Encounter: Payer: Self-pay | Admitting: Nurse Practitioner

## 2020-08-17 ENCOUNTER — Other Ambulatory Visit: Payer: Self-pay | Admitting: Family

## 2020-08-17 ENCOUNTER — Other Ambulatory Visit: Payer: Self-pay

## 2020-08-17 VITALS — BP 138/85 | HR 81 | Temp 97.0°F | Ht 68.0 in | Wt 188.2 lb

## 2020-08-17 DIAGNOSIS — K766 Portal hypertension: Secondary | ICD-10-CM

## 2020-08-17 DIAGNOSIS — F1011 Alcohol abuse, in remission: Secondary | ICD-10-CM

## 2020-08-17 DIAGNOSIS — I8510 Secondary esophageal varices without bleeding: Secondary | ICD-10-CM

## 2020-08-17 DIAGNOSIS — K7460 Unspecified cirrhosis of liver: Secondary | ICD-10-CM | POA: Diagnosis not present

## 2020-08-17 DIAGNOSIS — I10 Essential (primary) hypertension: Secondary | ICD-10-CM

## 2020-08-17 DIAGNOSIS — E1149 Type 2 diabetes mellitus with other diabetic neurological complication: Secondary | ICD-10-CM

## 2020-08-17 DIAGNOSIS — I8500 Esophageal varices without bleeding: Secondary | ICD-10-CM | POA: Insufficient documentation

## 2020-08-17 MED ORDER — NADOLOL 40 MG PO TABS: 40.0000 mg | ORAL_TABLET | Freq: Every day | ORAL | 2 refills | Status: DC

## 2020-08-17 MED ORDER — RIFAXIMIN 550 MG PO TABS: 550.0000 mg | ORAL_TABLET | Freq: Two times a day (BID) | ORAL | 3 refills | Status: DC

## 2020-08-17 MED ORDER — FUROSEMIDE 20 MG PO TABS: 20.0000 mg | ORAL_TABLET | Freq: Every day | ORAL | 3 refills | Status: DC

## 2020-08-17 MED ORDER — NADOLOL 40 MG PO TABS: 40.0000 mg | ORAL_TABLET | Freq: Every day | ORAL | 3 refills | Status: DC

## 2020-08-17 NOTE — Progress Notes (Signed)
Referring Provider: Sharion Balloon, FNP Primary Care Physician:  Sharion Balloon, FNP Primary GI:  Dr. Gala Romney  Chief Complaint  Patient presents with  . Cirrhosis    F/u. C/o swelling in feet    HPI:   Thomas Pratt is a 60 y.o. male who presents for follow-up on cirrhosis.  The patient was last seen in our office 02/15/2020 for cirrhosis, abdominal pain, history of alcohol abuse, GI bleed, dysphagia.  Abstinent from alcohol for 3+ years.  History of positive ANA presumed PBC as well as alk phos and subsequently started on Urso.  He was evaluated by [facility] liver transplant who recommended continue Corgard 40 mg daily and 20 mg in the afternoon, lactulose, rifaximin, diuretics but hold Urso until liver biopsy.  Requirement of 6 months of alcohol/substance abuse counseling prior to transplant eval.  Colonoscopy up-to-date 2018.  Meld score chronically around 7-8, child Pugh class A.  EGD up-to-date 2020 with grade 2 esophageal varices status post dilation, portal hypertensive gastropathy and recommended continue medications including Dexilant.  He did have a hospitalization in June 2021 for acute GI bleed with soft blood pressures, bloody stools, hemoglobin above 12.  CT of the abdomen and pelvis was normal.  He was treated with octreotide and IV Protonix bolus with an EGD updated 12/23/2019 with a innocent appearing and barely grade 2 esophageal varices, mild portal hypertensive gastropathy, no evidence of bleeding from upper GI tract and recommend he continue his current medication.  At his last visit he located in alcohol treatment center in [place], [place] to complete his required 6 months of counseling.  He is going to check it out soon.  Saw surgeon who evaluated his previous hernia which he felt look good.  Dysphagia doing well.  Trying to get better control of his diabetes with new medications from primary care.  No further GI bleed.  Remains on Corgard, Xifaxan, Aldactone.  Heart  rate 73 at that time with blood pressure 120/64.  Varices look well controlled on last EGD.  Recommended continue alcohol abstinence, low-salt diet counseling provided again, update labs, continue medications, call us for any worsening, right upper quadrant ultrasound.  Follow-up in 6 months for routine liver care.  Labs are completed 02/16/2020 with normal CBC including platelet count 172, hemoglobin normalized at 14.  CMP with normal LFTs and creatinine.  INR 1.1.  AFP normal at 1.9.  Meld calculated at 8, child Pugh class A.  Right upper quadrant ultrasound completed 09/16/2019 which found consistent with underlying parenchymal liver disease with likely superimposed hepatic steatosis, no focal lesions identified.  Today he states he doing okay overall. He is going to be getting a COVID booster soon. Denies abdominal pain, N/V, hematochezia, melena, fever, chills, unintentional weight loss. Denies any yellowing of the skin/eyes, darkened urine, acute episodic confusion, generalized tremors, generalized pruritis. The patient has received COVID-19 vaccination(s). Denies chest pain, dyspnea, dizziness, lightheadedness, syncope, near syncope. Denies any other upper or lower GI symptoms.  States he fell in the shower, didn't feel dizzy. He grabbed a grab bar when getting into the shower and it broke causing him to fall.  He has a skin tear on the left elbow (not actively bleeding) and twisted his left ankle (though he can bear weight on it, not significantly swollen). He has been putting antibiotic ointment on his elbow.  He is still on Corgard (but only 20 mg daily), Aldactone 56m daily. At some point his Furosamide was d/c'd. Xifaxan  isn't on his medication list either.  He starts ETOH counseling this coming week.  Past Medical History:  Diagnosis Date  . Cirrhosis (HCC)    likely ETOH use   . Diabetes (HCC)   . Heat stroke 2016/17    Past Surgical History:  Procedure Laterality Date  .  CHOLECYSTECTOMY N/A 09/01/2017   Procedure: LAPAROSCOPIC CHOLECYSTECTOMY;  Surgeon: Jenkins, Mark, MD;  Location: [facility];  Service: General;  Laterality: N/A;  . COLONOSCOPY WITH PROPOFOL N/A 03/06/2017   Dr. Rourk: tubular adenoma removed, next TCS 5 years if health permits.   . ESOPHAGOGASTRODUODENOSCOPY (EGD) WITH PROPOFOL N/A 03/06/2017   Dr. Rourk: grade 1 esophageal varices, erosive esophagitis, portal gastropathy.   . ESOPHAGOGASTRODUODENOSCOPY (EGD) WITH PROPOFOL N/A 05/03/2019   Procedure: ESOPHAGOGASTRODUODENOSCOPY (EGD) WITH PROPOFOL;  Surgeon: Rourk, Robert M, MD;  Location: [facility];  Service: Endoscopy;  Laterality: N/A;  10:30am  . ESOPHAGOGASTRODUODENOSCOPY (EGD) WITH PROPOFOL N/A 12/23/2019   Procedure: ESOPHAGOGASTRODUODENOSCOPY (EGD) WITH PROPOFOL Possible esophageal variceal banding.;  Surgeon: Rourk, Robert M, MD;  Location: [facility];  Service: Endoscopy;  Laterality: N/A;  . HERNIA REPAIR Right    RIH  . INGUINAL HERNIA REPAIR Right 07/11/2017   Procedure: HERNIA REPAIR INGUINAL ADULT WITH MESH;  Surgeon: Jenkins, Mark, MD;  Location: [facility];  Service: General;  Laterality: Right;  patient knows to arrive at 7:30  . IR TRANSCATHETER BX  10/14/2017  . IR US GUIDE VASC ACCESS RIGHT  10/14/2017  . IR VENOGRAM HEPATIC WO HEMODYNAMIC EVALUATION  10/14/2017  . MALONEY DILATION N/A 05/03/2019   Procedure: MALONEY DILATION;  Surgeon: Rourk, Robert M, MD;  Location: [facility];  Service: Endoscopy;  Laterality: N/A;  . POLYPECTOMY  03/06/2017   Procedure: POLYPECTOMY;  Surgeon: Rourk, Robert M, MD;  Location: [facility];  Service: Endoscopy;;  ascending colon;    Current Outpatient Medications  Medication Sig Dispense Refill  . Accu-Chek Softclix Lancets lancets Test BS 4 (four) times daily. Dx E11.69 (Patient taking differently: 1 each by Other route 4 (four) times daily as needed. Test BS 4 (four) times daily. Dx E11.69) 400 each 3  . atorvastatin (LIPITOR) 10 MG  tablet Take 1 tablet (10 mg total) by mouth daily. 90 tablet 0  . blood glucose meter kit and supplies Dispense based on patient and insurance preference. Use up to four times daily as directed. (FOR ICD-10 E10.9, E11.9). (Patient taking differently: 1 each by Other route once. Dispense based on patient and insurance preference. Use up to four times daily as directed. (FOR ICD-10 E10.9, E11.9).) 1 each 0  . Blood Glucose Monitoring Suppl (ACCU-CHEK GUIDE) w/Device KIT Test BS 4 (four) times daily. Dx E11.69 1 kit 0  . cetirizine (ZYRTEC) 10 MG tablet Take 1 tablet (10 mg total) by mouth daily. 30 tablet 11  . dapagliflozin propanediol (FARXIGA) 10 MG TABS tablet Take 1 tablet (10 mg total) by mouth daily before breakfast. 90 tablet 3  . dexlansoprazole (DEXILANT) 60 MG capsule Take 1 capsule (60 mg total) by mouth daily. 90 capsule 3  . escitalopram (LEXAPRO) 10 MG tablet Take 1 tablet (10 mg total) by mouth daily. 90 tablet 0  . fluticasone (FLONASE) 50 MCG/ACT nasal spray Place 2 sprays into both nostrils daily. 16 g 6  . gabapentin (NEURONTIN) 300 MG capsule Take 1 capsule (300 mg total) by mouth at bedtime. 30 capsule 3  . glucose blood (ACCU-CHEK GUIDE) test strip Test BS 4 (four) times daily.   Dx E11.69 (Patient taking differently: 1 each by Other route 4 (four) times daily as needed. Test BS 4 (four) times daily. Dx E11.69) 400 each 3  . insulin degludec (TRESIBA FLEXTOUCH) 100 UNIT/ML FlexTouch Pen Inject 25 Units into the skin at bedtime. 15 mL 3  . Insulin Pen Needle (B-D UF III MINI PEN NEEDLES) 31G X 5 MM MISC USE WITH TRESIBA daily Dx E11.69 (Patient taking differently: Inject 1 each into the skin daily. USE WITH TRESIBA daily Dx E11.69) 100 each 3  . metFORMIN (GLUCOPHAGE-XR) 750 MG 24 hr tablet TAKE 2 TABLETS BY MOUTH ONCE DAILY WITH BREAKFAST 180 tablet 0  . nadolol (CORGARD) 20 MG tablet Take 1 tablet (20 mg total) by mouth daily. 30 tablet 4  . ondansetron (ZOFRAN) 4 MG tablet Take 1  tablet (4 mg total) by mouth every 6 (six) hours as needed for nausea. 20 tablet 0  . spironolactone (ALDACTONE) 50 MG tablet Take 1 tablet (50 mg total) by mouth daily. 90 tablet 0  . VITAMIN D PO Take 1 capsule by mouth daily.      No current facility-administered medications for this visit.    Allergies as of 08/17/2020  . (No Known Allergies)    Family History  Problem Relation Age of Onset  . COPD Mother   . Liver disease Neg Hx   . Colon cancer Neg Hx   . Colon polyps Neg Hx     Social History   Socioeconomic History  . Marital status: Divorced    Spouse name: Not on file  . Number of children: Not on file  . Years of education: Not on file  . Highest education level: Not on file  Occupational History  . Occupation: unemployed    Comment: disability  Tobacco Use  . Smoking status: Never Smoker  . Smokeless tobacco: Never Used  Vaping Use  . Vaping Use: Never used  Substance and Sexual Activity  . Alcohol use: No    Comment: Quit September 2017, heavy alcohol abuse previously  . Drug use: No  . Sexual activity: Not Currently    Partners: Female  Other Topics Concern  . Not on file  Social History Narrative   Lives in [place], [place]. Is a retired painter. Is on disability for cirrhosis.    Drank alcohol for years, quit 05/07/16. Has been sober since then. Quit b/c of health/cirrhosis.   Divorced for over 15 years.   Dating sporadically.   Has 3 daughters and 1 son live locally.   Originally from [place], moved here b/c of wife.    Eats all foods.    Wear seatbelt.   Does not drive, does not have a license, lost due to DWI.    Brought by neighbor.    Does not go to church. Believes in God. Prays each night, per report.    Social Determinants of Health   Financial Resource Strain: Not on file  Food Insecurity: Not on file  Transportation Needs: Not on file  Physical Activity: Not on file  Stress: Not on file  Social Connections: Not on file     Subjective: Review of Systems  Constitutional: Negative for chills, fever, malaise/fatigue and weight loss.  HENT: Negative for congestion and sore throat.   Respiratory: Negative for cough and shortness of breath.   Cardiovascular: Negative for chest pain and palpitations.  Gastrointestinal: Negative for abdominal pain, blood in stool, constipation, diarrhea, heartburn, melena, nausea and vomiting.  Musculoskeletal: Negative for joint   pain and myalgias.  Skin: Negative for rash.  Neurological: Negative for dizziness and weakness.  Endo/Heme/Allergies: Does not bruise/bleed easily.  Psychiatric/Behavioral: Negative for depression. The patient is not nervous/anxious.   All other systems reviewed and are negative.    Objective: BP 138/85   Pulse 81   Temp (!) 97 F (36.1 C)   Ht 5' 8" (1.727 m)   Wt 188 lb 3.2 oz (85.4 kg)   BMI 28.62 kg/m  Physical Exam Vitals and nursing note reviewed.  Constitutional:      General: He is not in acute distress.    Appearance: Normal appearance. He is normal weight. He is not ill-appearing, toxic-appearing or diaphoretic.  HENT:     Head: Normocephalic and atraumatic.     Nose: No congestion or rhinorrhea.  Eyes:     General: No scleral icterus. Cardiovascular:     Rate and Rhythm: Normal rate and regular rhythm.     Heart sounds: Normal heart sounds.  Pulmonary:     Effort: Pulmonary effort is normal.     Breath sounds: Normal breath sounds.  Abdominal:     General: Bowel sounds are normal. There is no distension.     Palpations: Abdomen is soft. There is no hepatomegaly, splenomegaly or mass.     Tenderness: There is no abdominal tenderness. There is no guarding or rebound.     Hernia: No hernia is present.  Musculoskeletal:     Cervical back: Neck supple.     Comments: Mild/minimal "puffy" bilateral LE edema  Skin:    General: Skin is warm and dry.     Coloration: Skin is not jaundiced.     Findings: Signs of injury  present. No bruising or rash.       Neurological:     General: No focal deficit present.     Mental Status: He is alert and oriented to person, place, and time. Mental status is at baseline.  Psychiatric:        Mood and Affect: Mood normal.        Behavior: Behavior normal.        Thought Content: Thought content normal.      Assessment:  Very pleasant 59-year-old male well-known to our service who follows up for alcoholic cirrhosis, portal hypertension, grade 2 esophageal varices. He continues to abstain from alcohol (he has now been abstinent for 3+ years). No red flag/warning signs or symptoms.  Cirrhosis: The patient is historically well compensated disease with meld score of 7-8. Regardless, he has been referred to [facility] at some point about a couple years ago for a liver transplant evaluation. They requested 6 months of alcohol cessation counseling. He struggled to get in contact with this because of COVID-19 pandemic and most places not accepting patients. He has been accepted at a outpatient treatment facility and will start there next week. His daughter will accompany him.  Unfortunately, it appears that somebody has changed off a lot of his medications. He was previously on Corgard 40 mg in the morning and 20 mg in the afternoon, Xifaxan 550 mg twice a day, Lasix 40 mg once a day (in addition to his Aldactone). His Corgard was changed to 20 mg once a day, Xifaxan and Lasix were discontinued. I will restart his Xifaxan, restart Lasix at 20 mg once a day due to mild/minimal edema. I will start his Corgard back at 40 mg once daily and requested to call us in 1 week with a blood pressure   and heart rate reading at which point we can increase to 40 mg once a day and 20 mg in the afternoon (trying to prevent significant drop in blood pressure, heart rate, symptomatic changes).  He is currently due for updated labs and imaging and we will arrange for these as well.   Plan: 1. Restart Xifaxan  550 mg twice a day 2. Restart Lasix at 40 mg once a day 3. Restart Corgard 40 mg once a day 4. CBC, CMP, INR, AFP 5. Right upper quadrant ultrasound 6. Call in 1 to 2 weeks with blood pressure and heart rate reading to decide if we can increase his nadolol back to his full dose prior to it being changed 7. Follow-up in 6 months 8. Call for worsening symptoms    Thank you for allowing us to participate in the care of Thomas Pratt  Eric Gill, DNP, AGNP-C Adult & Gerontological Nurse Practitioner [facility]   08/17/2020 1:44 PM   Disclaimer: This note was dictated with voice recognition software. Similar sounding words can inadvertently be transcribed and may not be corrected upon review.  

## 2020-08-17 NOTE — Progress Notes (Signed)
Cc'ed to pcp °

## 2020-08-17 NOTE — Patient Instructions (Signed)
Your health issues we discussed today were:   Cirrhosis: 1. I sent a prescription to change your nadolol (Corgard) to 40 mg once a day 2. You can use the 20 mg pills you have on hand. Just take 2 of them together, once a day 3. I have sent a prescription to restart Xifaxan 550 mg. Take this twice a day to prevent confusion from your liver 4. I sent a prescription to add Lasix (furosemide) back in your medications. Take a 20 mg pill once a day 5. Have your labs completed when you are able to 6. We will help schedule your ultrasound your liver for you 7. It is very important in 1 to 2 weeks to take your blood pressure and heart rate on the new dose of Corgard (nadolol). Call our office and tell us what your blood pressure and heart rate are. We may increase your dose of nadolol (Corgard) depending on how your heart rate and blood pressure are doing 8. Call us for any worsening or severe symptoms such as confusion, yellowing skin/eyes, dark urine, worsening swelling across your belly or in your ankles, etc.  Overall I recommend:  1. Continue your other current medications 2. Return for follow-up in 6 months 3. Call us for any questions or concerns   ---------------------------------------------------------------  I am glad you have gotten your COVID-19 vaccination!  Even though you are fully vaccinated you should continue to follow CDC and state/local guidelines.  ---------------------------------------------------------------   At [facility] we value your feedback. You may receive a survey about your visit today. Please share your experience as we strive to create trusting relationships with our patients to provide genuine, compassionate, quality care.  We appreciate your understanding and patience as we review any laboratory studies, imaging, and other diagnostic tests that are ordered as we care for you. Our office policy is 5 business days for review of these results, and  any emergent or urgent results are addressed in a timely manner for your best interest. If you do not hear from our office in 1 week, please contact us.   We also encourage the use of MyChart, which contains your medical information for your review as well. If you are not enrolled in this feature, an access code is on this after visit summary for your convenience. Thank you for allowing [place] to be involved in your care.  It was great to see you today!  I hope you have a safe and warm winter!!

## 2020-08-18 ENCOUNTER — Telehealth: Payer: Self-pay | Admitting: *Deleted

## 2020-08-18 NOTE — Telephone Encounter (Signed)
[place] scheduled for 2/2 at 7:30am, arrival 7:15am, npo midnight  Called pt and made aware of appt details. Nothing further needed

## 2020-08-22 ENCOUNTER — Other Ambulatory Visit: Payer: Self-pay | Admitting: Family

## 2020-08-22 ENCOUNTER — Telehealth: Payer: Self-pay

## 2020-08-22 DIAGNOSIS — E1169 Type 2 diabetes mellitus with other specified complication: Secondary | ICD-10-CM

## 2020-08-22 NOTE — Telephone Encounter (Signed)
PA was submitted on covermymeds for Xifaxan. Waiting on an approval or denial.

## 2020-08-23 ENCOUNTER — Ambulatory Visit (HOSPITAL_COMMUNITY)
Admission: RE | Admit: 2020-08-23 | Discharge: 2020-08-23 | Disposition: A | Discharge status: Home / Self Care | Payer: Medicare Other | Source: Ambulatory Visit | Attending: Nurse Practitioner | Admitting: Nurse Practitioner

## 2020-08-23 ENCOUNTER — Other Ambulatory Visit (HOSPITAL_COMMUNITY)
Admission: RE | Admit: 2020-08-23 | Discharge: 2020-08-23 | Disposition: A | Discharge status: Home / Self Care | Payer: Medicare Other | Source: Ambulatory Visit | Attending: Nurse Practitioner | Admitting: Nurse Practitioner

## 2020-08-23 ENCOUNTER — Other Ambulatory Visit: Payer: Self-pay

## 2020-08-23 DIAGNOSIS — F1011 Alcohol abuse, in remission: Secondary | ICD-10-CM | POA: Diagnosis present

## 2020-08-23 DIAGNOSIS — I8510 Secondary esophageal varices without bleeding: Secondary | ICD-10-CM | POA: Insufficient documentation

## 2020-08-23 DIAGNOSIS — K7460 Unspecified cirrhosis of liver: Secondary | ICD-10-CM | POA: Insufficient documentation

## 2020-08-23 DIAGNOSIS — K766 Portal hypertension: Secondary | ICD-10-CM | POA: Insufficient documentation

## 2020-08-23 LAB — CBC WITH DIFFERENTIAL/PLATELET
Abs Immature Granulocytes: 0.01 10*3/uL (ref 0.00–0.07)
Basophils Absolute: 0 10*3/uL (ref 0.0–0.1)
Basophils Relative: 0 %
Eosinophils Absolute: 0.2 10*3/uL (ref 0.0–0.5)
Eosinophils Relative: 2 %
HCT: 46.9 % (ref 39.0–52.0)
Hemoglobin: 15.4 g/dL (ref 13.0–17.0)
Immature Granulocytes: 0 %
Lymphocytes Relative: 27 %
Lymphs Abs: 2.1 10*3/uL (ref 0.7–4.0)
MCH: 30 pg (ref 26.0–34.0)
MCHC: 32.8 g/dL (ref 30.0–36.0)
MCV: 91.4 fL (ref 80.0–100.0)
Monocytes Absolute: 0.5 10*3/uL (ref 0.1–1.0)
Monocytes Relative: 7 %
Neutro Abs: 4.9 10*3/uL (ref 1.7–7.7)
Neutrophils Relative %: 64 %
Platelets: 243 10*3/uL (ref 150–400)
RBC: 5.13 MIL/uL (ref 4.22–5.81)
RDW: 14.2 % (ref 11.5–15.5)
WBC: 7.7 10*3/uL (ref 4.0–10.5)
nRBC: 0 % (ref 0.0–0.2)

## 2020-08-23 LAB — COMPREHENSIVE METABOLIC PANEL
ALT: 29 U/L (ref 0–44)
AST: 30 U/L (ref 15–41)
Albumin: 4.5 g/dL (ref 3.5–5.0)
Alkaline Phosphatase: 58 U/L (ref 38–126)
Anion gap: 9 (ref 5–15)
BUN: 18 mg/dL (ref 6–20)
CO2: 28 mmol/L (ref 22–32)
Calcium: 10.4 mg/dL — ABNORMAL HIGH (ref 8.9–10.3)
Chloride: 98 mmol/L (ref 98–111)
Creatinine, Ser: 0.99 mg/dL (ref 0.61–1.24)
GFR, Estimated: >60 mL/min (ref >60)
Glucose, Bld: 131 mg/dL — ABNORMAL HIGH (ref 70–99)
Potassium: 4.7 mmol/L (ref 3.5–5.1)
Sodium: 135 mmol/L (ref 135–145)
Total Bilirubin: 1 mg/dL (ref 0.3–1.2)
Total Protein: 8.7 g/dL — ABNORMAL HIGH (ref 6.5–8.1)

## 2020-08-23 LAB — PROTIME-INR
INR: 1 (ref 0.8–1.2)
Prothrombin Time: 12.8 seconds (ref 11.4–15.2)

## 2020-08-24 LAB — AFP TUMOR MARKER: AFP, Serum, Tumor Marker: 1.9 ng/mL (ref 0.0–8.3)

## 2020-08-24 NOTE — Telephone Encounter (Signed)
PA was denied for Xifaxan 550 mg through pts insurance company. Pt has Cirrhosis of the liver , PBC. Is there any other Dx that can be used. HE & IBS-D is the preferred dx for Xifaxan. Please advise.

## 2020-08-24 NOTE — Telephone Encounter (Signed)
Noted. Papers were submitted to the appeals department. Waiting on an approval or denial.

## 2020-08-24 NOTE — Telephone Encounter (Signed)
Yes, we can use HE (he's on it to prevent recurrence of his HE)

## 2020-09-07 ENCOUNTER — Other Ambulatory Visit: Payer: Self-pay

## 2020-09-07 ENCOUNTER — Encounter: Payer: Self-pay | Admitting: Family Medicine

## 2020-09-07 ENCOUNTER — Ambulatory Visit (INDEPENDENT_AMBULATORY_CARE_PROVIDER_SITE_OTHER): Payer: Medicare Other

## 2020-09-07 ENCOUNTER — Telehealth: Payer: Self-pay

## 2020-09-07 ENCOUNTER — Ambulatory Visit (INDEPENDENT_AMBULATORY_CARE_PROVIDER_SITE_OTHER): Payer: Medicare Other | Admitting: Family Medicine

## 2020-09-07 VITALS — Temp 98.0°F | Ht 68.0 in | Wt 185.5 lb

## 2020-09-07 DIAGNOSIS — M79672 Pain in left foot: Secondary | ICD-10-CM | POA: Diagnosis not present

## 2020-09-07 DIAGNOSIS — M25572 Pain in left ankle and joints of left foot: Secondary | ICD-10-CM

## 2020-09-07 DIAGNOSIS — M25512 Pain in left shoulder: Secondary | ICD-10-CM | POA: Diagnosis not present

## 2020-09-07 DIAGNOSIS — M7732 Calcaneal spur, left foot: Secondary | ICD-10-CM | POA: Diagnosis not present

## 2020-09-07 DIAGNOSIS — W19XXXA Unspecified fall, initial encounter: Secondary | ICD-10-CM

## 2020-09-07 DIAGNOSIS — R519 Headache, unspecified: Secondary | ICD-10-CM

## 2020-09-07 MED ORDER — PREDNISONE 20 MG PO TABS: 20.0000 mg | ORAL_TABLET | Freq: Every day | ORAL | 0 refills | Status: AC

## 2020-09-07 NOTE — Patient Instructions (Signed)
Musculoskeletal Pain Musculoskeletal pain refers to aches and pains in your bones, joints, muscles, and the tissues that surround them. This pain can occur in any part of the body. It can last for a short time (acute) or a long time (chronic). A physical exam, lab tests, and imaging studies may be done to find the cause of your musculoskeletal pain. Follow these instructions at home: Lifestyle  Try to control or lower your stress levels. Stress increases muscle tension and can worsen musculoskeletal pain. It is important to recognize when you are anxious or stressed and learn ways to manage it. This may include: ? Meditation or yoga. ? Cognitive or behavioral therapy. ? Acupuncture or massage therapy.  You may continue all activities unless the activities cause more pain. When the pain gets better, slowly resume your normal activities. Gradually increase the intensity and duration of your activities or exercise. Managing pain, stiffness, and swelling  Treatment may include medicines for pain and inflammation that are taken by mouth or applied to the skin. Take over-the-counter and prescription medicines only as told by your health care provider.  When your pain is severe, bed rest may be helpful. Lie or sit in any position that is comfortable, but get out of bed and walk around at least every couple of hours.  If directed, apply heat to the affected area as often as told by your health care provider. Use the heat source that your health care provider recommends, such as a moist heat pack or a heating pad. ? Place a towel between your skin and the heat source. ? Leave the heat on for 20-30 minutes. ? Remove the heat if your skin turns bright red. This is especially important if you are unable to feel pain, heat, or cold. You may have a greater risk of getting burned.  If directed, put ice on the painful area. To do this: ? Put ice in a plastic bag. ? Place a towel between your skin and the  bag. ? Leave the ice on for 20 minutes, 2-3 times a day. ? Remove the ice if your skin turns bright red. This is very important. If you cannot feel pain, heat, or cold, you have a greater risk of damage to the area.      General instructions  Your health care provider may recommend that you see a physical therapist. This person can help you come up with a safe exercise program.  If told by your health care provider, do physical therapy exercises to improve movement and strength in the affected area.  Keep all follow-up visits. This is important. This includes any physical therapy visits. Contact a health care provider if:  Your pain gets worse.  Medicines do not help ease your pain.  You cannot use the part of your body that hurts, such as your arm, leg, or neck.  You have trouble sleeping.  You have trouble doing your normal activities. Get help right away if:  You have a new injury and your pain is worse or different.  You feel numb or you have tingling in the painful area. Summary  Musculoskeletal pain refers to aches and pains in your bones, joints, muscles, and the tissues that surround them.  This pain can occur in any part of the body.  Your health care provider may recommend that you see a physical therapist. This person can help you come up with a safe exercise program. Do any exercises as told by your physical therapist.    Lower your stress level. Stress can worsen musculoskeletal pain. Ways to lower stress may include meditation, yoga, cognitive or behavioral therapy, acupuncture, and massage therapy. This information is not intended to replace advice given to you by your health care provider. Make sure you discuss any questions you have with your health care provider. Document Revised: 11/11/2019 Document Reviewed: 10/20/2019 Elsevier Patient Education  2021 [place] Prevention in the Home, Adult Falls can cause injuries and can affect people from all  age groups. There are many simple things that you can do to make your home safe and to help prevent falls. Ask for help when making these changes, if needed. What actions can I take to prevent falls? General instructions  Use good lighting in all rooms. Replace any light bulbs that burn out.  Turn on lights if it is dark. Use night-lights.  Place frequently used items in easy-to-reach places. Lower the shelves around your home if necessary.  Set up furniture so that there are clear paths around it. Avoid moving your furniture around.  Remove throw rugs and other tripping hazards from the floor.  Avoid walking on wet floors.  Fix any uneven floor surfaces.  Add color or contrast paint or tape to grab bars and handrails in your home. Place contrasting color strips on the first and last steps of stairways.  When you use a stepladder, make sure that it is completely opened and that the sides are firmly locked. Have someone hold the ladder while you are using it. Do not climb a closed stepladder.  Be aware of any and all pets. What can I do in the bathroom?  Keep the floor dry. Immediately clean up any water that spills onto the floor.  Remove soap buildup in the tub or shower on a regular basis.  Use non-skid mats or decals on the floor of the tub or shower.  Attach bath mats securely with double-sided, non-slip rug tape.  If you need to sit down while you are in the shower, use a plastic, non-slip stool.  Install grab bars by the toilet and in the tub and shower. Do not use towel bars as grab bars.      What can I do in the bedroom?  Make sure that a bedside light is easy to reach.  Do not use oversized bedding that drapes onto the floor.  Have a firm chair that has side arms to use for getting dressed. What can I do in the kitchen?  Clean up any spills right away.  If you need to reach for something above you, use a sturdy step stool that has a grab bar.  Keep  electrical cables out of the way.  Do not use floor polish or wax that makes floors slippery. If you must use wax, make sure that it is non-skid floor wax. What can I do in the stairways?  Do not leave any items on the stairs.  Make sure that you have a light switch at the top of the stairs and the bottom of the stairs. Have them installed if you do not have them.  Make sure that there are handrails on both sides of the stairs. Fix handrails that are broken or loose. Make sure that handrails are as long as the stairways.  Install non-slip stair treads on all stairs in your home.  Avoid having throw rugs at the top or bottom of stairways, or secure the rugs with carpet tape to prevent them from  moving.  Choose a carpet design that does not hide the edge of steps on the stairway.  Check any carpeting to make sure that it is firmly attached to the stairs. Fix any carpet that is loose or worn. What can I do on the outside of my home?  Use bright outdoor lighting.  Regularly repair the edges of walkways and driveways and fix any cracks.  Remove high doorway thresholds.  Trim any shrubbery on the main path into your home.  Regularly check that handrails are securely fastened and in good repair. Both sides of any steps should have handrails.  Install guardrails along the edges of any raised decks or porches.  Clear walkways of debris and clutter, including tools and rocks.  Have leaves, snow, and ice cleared regularly.  Use sand or salt on walkways during winter months.  In the garage, clean up any spills right away, including grease or oil spills. What other actions can I take?  Wear closed-toe shoes that fit well and support your feet. Wear shoes that have rubber soles or low heels.  Use mobility aids as needed, such as canes, walkers, scooters, and crutches.  Review your medicines with your health care provider. Some medicines can cause dizziness or changes in blood pressure,  which increase your risk of falling. Talk with your health care provider about other ways that you can decrease your risk of falls. This may include working with a physical therapist or trainer to improve your strength, balance, and endurance. Where to find more information  Centers for Disease Control and Prevention, [employer]AD[employer]: WebmailGuide.co.za  [employer] on Aging: BrainJudge.co.uk Contact a health care provider if:  You are afraid of falling at home.  You feel weak, drowsy, or dizzy at home.  You fall at home. Summary  There are many simple things that you can do to make your home safe and to help prevent falls.  Ways to make your home safe include removing tripping hazards and installing grab bars in the bathroom.  Ask for help when making these changes in your home. This information is not intended to replace advice given to you by your health care provider. Make sure you discuss any questions you have with your health care provider. Document Revised: 06/20/2017 Document Reviewed: 02/20/2017 Elsevier Patient Education  2021 [employer].

## 2020-09-07 NOTE — Progress Notes (Signed)
Established Patient Office Visit  Subjective:  Patient ID: Thomas Pratt, male    DOB: November 07, 1960  Age: 60 y.o. MRN: [id number]  CC:  Chief Complaint  Patient presents with  . Fall    HPI Thomas Pratt presents for a fall. He fell in the shower on 08/17/20. He rolled his ankle when he did this. He denies LOC. He hit the side of his head of the shoulder wall. He reports a headaches every few days since then. He has not taken anything for the headaches. The headache are mild. He denies changes in his balance or gait, dizziness, changes in vision, confusion, nausea, or vomiting. He reports pain in his left ankle and foot and in his left arm. The pain in his left ankle and foot is constant and gets worse throughout the day. The pain is sharp along the side of his ankle and along the top of his foot. He reports some mild swelling in his foot or ankle. He has had a previous fracture in his left arm. The pain is in his upper arm starting from his shoulder. The pain in arm is a dull pain that is worse with movement. He denies swelling in his arm. He denies fever or decreased ROM. He has not taken anything for his pain.   Past Medical History:  Diagnosis Date  . Cirrhosis ([place])    likely ETOH use   . Diabetes ([place])   . Heat stroke 2016/17    Past Surgical History:  Procedure Laterality Date  . CHOLECYSTECTOMY N/A 09/01/2017   Procedure: LAPAROSCOPIC CHOLECYSTECTOMY;  Surgeon: Aviva Signs, MD;  Location: [facility];  Service: General;  Laterality: N/A;  . COLONOSCOPY WITH PROPOFOL N/A 03/06/2017   Dr. Gala Romney: tubular adenoma removed, next TCS 5 years if health permits.   . ESOPHAGOGASTRODUODENOSCOPY (EGD) WITH PROPOFOL N/A 03/06/2017   Dr. Gala Romney: grade 1 esophageal varices, erosive esophagitis, portal gastropathy.   . ESOPHAGOGASTRODUODENOSCOPY (EGD) WITH PROPOFOL N/A 05/03/2019   Procedure: ESOPHAGOGASTRODUODENOSCOPY (EGD) WITH PROPOFOL;  Surgeon: Daneil Dolin, MD;  Location: [facility];  Service: Endoscopy;  Laterality: N/A;  10:30am  . ESOPHAGOGASTRODUODENOSCOPY (EGD) WITH PROPOFOL N/A 12/23/2019   Procedure: ESOPHAGOGASTRODUODENOSCOPY (EGD) WITH PROPOFOL Possible esophageal variceal banding.;  Surgeon: Daneil Dolin, MD;  Location: [facility];  Service: Endoscopy;  Laterality: N/A;  . HERNIA REPAIR Right    RIH  . INGUINAL HERNIA REPAIR Right 07/11/2017   Procedure: HERNIA REPAIR INGUINAL ADULT WITH MESH;  Surgeon: Aviva Signs, MD;  Location: [facility];  Service: General;  Laterality: Right;  patient knows to arrive at 7:30  . IR TRANSCATHETER BX  10/14/2017  . IR US GUIDE VASC ACCESS RIGHT  10/14/2017  . IR VENOGRAM HEPATIC WO HEMODYNAMIC EVALUATION  10/14/2017  . MALONEY DILATION N/A 05/03/2019   Procedure: Venia Minks DILATION;  Surgeon: Daneil Dolin, MD;  Location: [facility];  Service: Endoscopy;  Laterality: N/A;  . POLYPECTOMY  03/06/2017   Procedure: POLYPECTOMY;  Surgeon: Daneil Dolin, MD;  Location: [facility];  Service: Endoscopy;;  ascending colon;    Family History  Problem Relation Age of Onset  . COPD Mother   . Liver disease Neg Hx   . Colon cancer Neg Hx   . Colon polyps Neg Hx     Social History   Socioeconomic History  . Marital status: Divorced    Spouse name: Not on file  . Number of children: Not on file  . Years of  education: Not on file  . Highest education level: Not on file  Occupational History  . Occupation: unemployed    Comment: disability  Tobacco Use  . Smoking status: Never Smoker  . Smokeless tobacco: Never Used  Vaping Use  . Vaping Use: Never used  Substance and Sexual Activity  . Alcohol use: No    Comment: Quit September 2017, heavy alcohol abuse previously  . Drug use: No  . Sexual activity: Not Currently    Partners: Female  Other Topics Concern  . Not on file  Social History Narrative   Lives in [place], [place]. 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    Drank alcohol for years, quit  05/07/16. Has been sober since then. Quit b/c of health/cirrhosis.   Divorced for over 15 years.   Dating sporadically.   Has 3 daughters and 1 son live locally.   Originally from [place], moved here b/c of wife.    Eats all foods.    Wear seatbelt.   Does not drive, does not have a license, lost due to DWI.    Brought by neighbor.    Does not go to church. Believes in God. Prays each night, per report.    Social Determinants of Health   Financial Resource Strain: Not on file  Food Insecurity: Not on file  Transportation Needs: Not on file  Physical Activity: Not on file  Stress: Not on file  Social Connections: Not on file  Intimate Partner Violence: Not on file    Outpatient Medications Prior to Visit  Medication Sig Dispense Refill  . Accu-Chek Softclix Lancets lancets Test BS 4 (four) times daily. Dx E11.69 (Patient taking differently: 1 each by Other route 4 (four) times daily as needed. Test BS 4 (four) times daily. Dx E11.69) 400 each 3  . atorvastatin (LIPITOR) 10 MG tablet TAKE 1 TABLET BY MOUTH  DAILY 90 tablet 0  . blood glucose meter kit and supplies Dispense based on patient and insurance preference. Use up to four times daily as directed. (FOR ICD-10 E10.9, E11.9). (Patient taking differently: 1 each by Other route once. Dispense based on patient and insurance preference. Use up to four times daily as directed. (FOR ICD-10 E10.9, E11.9).) 1 each 0  . Blood Glucose Monitoring Suppl (ACCU-CHEK GUIDE) w/Device KIT Test BS 4 (four) times daily. Dx E11.69 1 kit 0  . cetirizine (ZYRTEC) 10 MG tablet Take 1 tablet (10 mg total) by mouth daily. 30 tablet 11  . dapagliflozin propanediol (FARXIGA) 10 MG TABS tablet Take 1 tablet (10 mg total) by mouth daily before breakfast. 90 tablet 3  . dexlansoprazole (DEXILANT) 60 MG capsule Take 1 capsule (60 mg total) by mouth daily. 90 capsule 3  . escitalopram (LEXAPRO) 10 MG tablet Take 1 tablet (10 mg total) by mouth daily. 90 tablet 0  .  fluticasone (FLONASE) 50 MCG/ACT nasal spray Place 2 sprays into both nostrils daily. 16 g 6  . furosemide (LASIX) 20 MG tablet Take 1 tablet (20 mg total) by mouth daily. 90 tablet 3  . gabapentin (NEURONTIN) 300 MG capsule Take 1 capsule by mouth at bedtime 30 capsule 2  . glucose blood (ACCU-CHEK GUIDE) test strip Test BS 4 (four) times daily. Dx E11.69 (Patient taking differently: 1 each by Other route 4 (four) times daily as needed. Test BS 4 (four) times daily. Dx E11.69) 400 each 3  . insulin degludec (TRESIBA FLEXTOUCH) 100 UNIT/ML FlexTouch Pen Inject 25 Units into the  skin at bedtime. 15 mL 3  . Insulin Pen Needle (B-D UF III MINI PEN NEEDLES) 31G X 5 MM MISC USE WITH TRESIBA daily Dx E11.69 (Patient taking differently: Inject 1 each into the skin daily. USE WITH TRESIBA daily Dx E11.69) 100 each 3  . metFORMIN (GLUCOPHAGE-XR) 750 MG 24 hr tablet TAKE 2 TABLETS BY MOUTH ONCE DAILY WITH BREAKFAST 180 tablet 0  . nadolol (CORGARD) 40 MG tablet Take 1 tablet (40 mg total) by mouth daily. 30 tablet 2  . ondansetron (ZOFRAN) 4 MG tablet Take 1 tablet (4 mg total) by mouth every 6 (six) hours as needed for nausea. 20 tablet 0  . rifaximin (XIFAXAN) 550 MG TABS tablet Take 1 tablet (550 mg total) by mouth 2 (two) times daily. 180 tablet 3  . spironolactone (ALDACTONE) 50 MG tablet Take 1 tablet by mouth once daily 90 tablet 0  . VITAMIN D PO Take 1 capsule by mouth daily.      No facility-administered medications prior to visit.    No Known Allergies  ROS Review of Systems As per HPI.    Objective:    Physical Exam Vitals and nursing note reviewed.  Constitutional:      General: He is not in acute distress.    Appearance: Normal appearance. He is not ill-appearing, toxic-appearing or diaphoretic.  HENT:     Head: Normocephalic and atraumatic.     Mouth/Throat:     Mouth: Mucous membranes are moist.     Pharynx: Oropharynx is clear.  Eyes:     General: Vision grossly intact. Gaze  aligned appropriately.     Extraocular Movements: Extraocular movements intact.     Right eye: No nystagmus.     Left eye: No nystagmus.     Conjunctiva/sclera: Conjunctivae normal.     Pupils: Pupils are equal, round, and reactive to light.  Pulmonary:     Effort: Pulmonary effort is normal. No respiratory distress.  Musculoskeletal:     Left shoulder: No swelling, deformity, tenderness or bony tenderness. Normal range of motion. Normal strength.     Left ankle: Swelling present. Tenderness present over the medial malleolus. Normal range of motion. Anterior drawer test negative.     Left Achilles Tendon: No tenderness.     Left foot: Normal range of motion and normal capillary refill. Tenderness (5th metatarsal base) present. No swelling. Normal pulse.  Skin:    General: Skin is warm and dry.  Neurological:     General: No focal deficit present.     Mental Status: He is alert and oriented to person, place, and time.     Cranial Nerves: No cranial nerve deficit.     Sensory: No sensory deficit.     Motor: No weakness.     Coordination: Coordination normal.     Gait: Gait normal.  Psychiatric:        Mood and Affect: Mood normal.        Behavior: Behavior normal.        Thought Content: Thought content normal.        Judgment: Judgment normal.     Temp 98 F (36.7 C) (Temporal)   Ht '5\' 8"'  (1.727 m)   Wt 185 lb 8 oz (84.1 kg)   BMI 28.21 kg/m  Wt Readings from Last 3 Encounters:  09/07/20 185 lb 8 oz (84.1 kg)  08/17/20 188 lb 3.2 oz (85.4 kg)  07/13/20 187 lb (84.8 kg)     Health Maintenance Due  Topic Date Due  . COVID-19 Vaccine (3 - Booster for Moderna series) 06/16/2020    There are no preventive care reminders to display for this patient.  Lab Results  Component Value Date   TSH 1.690 05/20/2019   Lab Results  Component Value Date   WBC 7.7 08/23/2020   HGB 15.4 08/23/2020   HCT 46.9 08/23/2020   MCV 91.4 08/23/2020   PLT 243 08/23/2020   Lab Results   Component Value Date   NA 135 08/23/2020   K 4.7 08/23/2020   CO2 28 08/23/2020   GLUCOSE 131 (H) 08/23/2020   BUN 18 08/23/2020   CREATININE 0.99 08/23/2020   BILITOT 1.0 08/23/2020   ALKPHOS 58 08/23/2020   AST 30 08/23/2020   ALT 29 08/23/2020   PROT 8.7 (H) 08/23/2020   ALBUMIN 4.5 08/23/2020   CALCIUM 10.4 (H) 08/23/2020   ANIONGAP 9 08/23/2020   Lab Results  Component Value Date   CHOL 181 05/20/2019   Lab Results  Component Value Date   HDL 45 05/20/2019   Lab Results  Component Value Date   LDLCALC 103 (H) 05/20/2019   Lab Results  Component Value Date   TRIG 193 (H) 05/20/2019   Lab Results  Component Value Date   CHOLHDL 4.0 05/20/2019   Lab Results  Component Value Date   HGBA1C 6.7 07/13/2020      Assessment & Plan:   Thomas Pratt was seen today for fall.  Diagnoses and all orders for this visit:  Left foot pain Xray today in office, radiology report pending. Will treat as sprain: Advil, heat, ice, rest, compression. Prednisone burst given.  -     predniSONE (DELTASONE) 20 MG tablet; Take 1 tablet (20 mg total) by mouth daily with breakfast for 5 days. -     DG Foot Complete Left; Future  Acute left ankle pain Xray today in office, radiology report pending. Will treat as sprain.  -     predniSONE (DELTASONE) 20 MG tablet; Take 1 tablet (20 mg total) by mouth daily with breakfast for 5 days. -     DG Ankle Complete Left; Future  Acute pain of left shoulder Xray today in office, radiology report pending. Patient report previous injury to shoulder/upper arm with possible previous fracture.  -     predniSONE (DELTASONE) 20 MG tablet; Take 1 tablet (20 mg total) by mouth daily with breakfast for 5 days. -     DG Shoulder Left; Future  Nonintractable episodic headache, unspecified headache type Following fall a few weeks ago. ? Post concussion headache. Mild, every few days. Normal neuro exam today. Advil for headache. Return to office for new or  worsening symptoms, or if symptoms persist.    Fall, initial encounter Handout given for fall prevention.  -     DG Ankle Complete Left; Future -     DG Foot Complete Left; Future -     DG Shoulder Left; Future  Follow-up: Return if symptoms worsen or fail to improve.   The patient indicates understanding of these issues and agrees with the plan.  Gwenlyn Perking, FNP

## 2020-09-07 NOTE — Telephone Encounter (Signed)
Pt has been approved. Authorization number [id number] for Xifaxan valid August 22, 2020 through July 21, 2021 [facility] healthcare)

## 2020-09-07 NOTE — Telephone Encounter (Signed)
Spoke with the pt and advised him that his medication (Xifaxan) was approved for a year. Also faxed the pharmacy the authorization note from [facility].

## 2020-10-13 ENCOUNTER — Ambulatory Visit: Payer: Medicare Other | Admitting: Family

## 2020-10-25 ENCOUNTER — Ambulatory Visit (INDEPENDENT_AMBULATORY_CARE_PROVIDER_SITE_OTHER): Payer: Medicare Other | Admitting: Family

## 2020-10-25 ENCOUNTER — Encounter: Payer: Self-pay | Admitting: Family

## 2020-10-25 ENCOUNTER — Other Ambulatory Visit: Payer: Self-pay

## 2020-10-25 VITALS — BP 109/69 | HR 61 | Temp 97.0°F | Ht 68.0 in | Wt 183.0 lb

## 2020-10-25 DIAGNOSIS — K7030 Alcoholic cirrhosis of liver without ascites: Secondary | ICD-10-CM

## 2020-10-25 DIAGNOSIS — E785 Hyperlipidemia, unspecified: Secondary | ICD-10-CM

## 2020-10-25 DIAGNOSIS — K219 Gastro-esophageal reflux disease without esophagitis: Secondary | ICD-10-CM

## 2020-10-25 DIAGNOSIS — E1169 Type 2 diabetes mellitus with other specified complication: Secondary | ICD-10-CM | POA: Diagnosis not present

## 2020-10-25 DIAGNOSIS — F1011 Alcohol abuse, in remission: Secondary | ICD-10-CM

## 2020-10-25 DIAGNOSIS — K7460 Unspecified cirrhosis of liver: Secondary | ICD-10-CM

## 2020-10-25 DIAGNOSIS — Z794 Long term (current) use of insulin: Secondary | ICD-10-CM

## 2020-10-25 DIAGNOSIS — E119 Type 2 diabetes mellitus without complications: Secondary | ICD-10-CM | POA: Insufficient documentation

## 2020-10-25 DIAGNOSIS — I10 Essential (primary) hypertension: Secondary | ICD-10-CM | POA: Diagnosis not present

## 2020-10-25 DIAGNOSIS — F320 Major depressive disorder, single episode, mild: Secondary | ICD-10-CM

## 2020-10-25 LAB — BAYER DCA HB A1C WAIVED: HB A1C (BAYER DCA - WAIVED): 7.1 % — ABNORMAL HIGH (ref <7.0)

## 2020-10-25 NOTE — Progress Notes (Signed)
Subjective:    Patient ID: Thomas Pratt, male    DOB: [birth year], 60 y.o.   MRN: [id number]  Chief Complaint  Patient presents with  . Diabetes    PT presents to the office today for chronic follow up. He is followed by our clinical pharmacists for DM education.  Heis followed by GIfor cirrhosis of liver. He has been sober since 09/2017and needs to get counseling program to get on transplant list. PT is stable at this time. Diabetes He presents for his follow-up diabetic visit. He has type 2 diabetes mellitus. His disease course has been stable. Associated symptoms include foot paresthesias. Pertinent negatives for diabetes include no blurred vision. Symptoms are stable. Diabetic complications include peripheral neuropathy. Pertinent negatives for diabetic complications include no heart disease. Risk factors for coronary artery disease include dyslipidemia, diabetes mellitus, male sex, hypertension and sedentary lifestyle. He is following a generally healthy diet. His overall blood glucose range is 130-140 mg/dl. An ACE inhibitor/angiotensin II receptor blocker is being taken. Eye exam is current.  Hyperlipidemia This is a chronic problem. The current episode started more than 1 year ago. The problem is controlled. Recent lipid tests were reviewed and are normal. Current antihyperlipidemic treatment includes statins. The current treatment provides moderate improvement of lipids. Risk factors for coronary artery disease include male sex, hypertension, a sedentary lifestyle, dyslipidemia and diabetes mellitus.  Depression        This is a chronic problem.  The current episode started more than 1 year ago.   Associated symptoms include no helplessness, no hopelessness, does not have insomnia, not irritable and no restlessness.  Past treatments include SSRIs - Selective serotonin reuptake inhibitors.  Past medical history includes anxiety.   Anxiety Presents for follow-up visit. Patient  reports no depressed mood, insomnia, irritability or restlessness. Symptoms occur occasionally.        Review of Systems  Constitutional: Negative for irritability.  Eyes: Negative for blurred vision.  Psychiatric/Behavioral: Positive for depression. The patient does not have insomnia.   All other systems reviewed and are negative.      Objective:   Physical Exam Vitals reviewed.  Constitutional:      General: He is not irritable.He is not in acute distress.    Appearance: He is well-developed.  HENT:     Head: Normocephalic.     Right Ear: Tympanic membrane normal.     Left Ear: Tympanic membrane normal.  Eyes:     General:        Right eye: No discharge.        Left eye: No discharge.     Pupils: Pupils are equal, round, and reactive to light.  Neck:     Thyroid: No thyromegaly.  Cardiovascular:     Rate and Rhythm: Normal rate and regular rhythm.     Heart sounds: Normal heart sounds. No murmur heard.   Pulmonary:     Effort: Pulmonary effort is normal. No respiratory distress.     Breath sounds: Normal breath sounds. No wheezing.  Abdominal:     General: Bowel sounds are normal. There is no distension.     Palpations: Abdomen is soft.     Tenderness: There is no abdominal tenderness.  Musculoskeletal:        General: No tenderness. Normal range of motion.     Cervical back: Normal range of motion and neck supple.  Skin:    General: Skin is warm and dry.     Findings: No erythema  or rash.  Neurological:     Mental Status: He is alert and oriented to person, place, and time.     Cranial Nerves: No cranial nerve deficit.     Deep Tendon Reflexes: Reflexes are normal and symmetric.  Psychiatric:        Behavior: Behavior normal.        Thought Content: Thought content normal.        Judgment: Judgment normal.     BP 109/69   Pulse 61   Temp (!) 97 F (36.1 C) (Temporal)   Ht '5\' 8"'  (1.727 m)   Wt 183 lb (83 kg)   BMI 27.83 kg/m      Assessment &  Plan:  Thomas Pratt comes in today with chief complaint of Diabetes   Diagnosis and orders addressed:  1. Primary hypertension - BMP8+EGFR  2. Gastroesophageal reflux disease, unspecified whether esophagitis present - BMP8+EGFR  3. Cirrhosis of liver without ascites, unspecified hepatic cirrhosis type (HCC) - BMP8+EGFR  4. Alcoholic cirrhosis of liver without ascites (HCC) - BMP8+EGFR  5. Hyperlipidemia associated with type 2 diabetes mellitus (HCC) - BMP8+EGFR  6. Depression, major, single episode, mild (HCC) - BMP8+EGFR  7. History of ETOH abuse - BMP8+EGFR  8. Type 2 diabetes mellitus with other specified complication, with long-term current use of insulin (HCC) - Bayer DCA Hb A1c Waived - BMP8+EGFR   Labs pending Health Maintenance reviewed Diet and exercise encouraged  Follow up plan:  4 months    Evelina Dun, FNP

## 2020-10-25 NOTE — Patient Instructions (Signed)
Diabetes Mellitus and Nutrition, Adult When you have diabetes, or diabetes mellitus, it is very important to have healthy eating habits because your blood sugar (glucose) levels are greatly affected by what you eat and drink. Eating healthy foods in the right amounts, at about the same times every day, can help you:  Control your blood glucose.  Lower your risk of heart disease.  Improve your blood pressure.  Reach or maintain a healthy weight. What can affect my meal plan? Every person with diabetes is different, and each person has different needs for a meal plan. Your health care provider may recommend that you work with a dietitian to make a meal plan that is best for you. Your meal plan may vary depending on factors such as:  The calories you need.  The medicines you take.  Your weight.  Your blood glucose, blood pressure, and cholesterol levels.  Your activity level.  Other health conditions you have, such as heart or kidney disease. How do carbohydrates affect me? Carbohydrates, also called carbs, affect your blood glucose level more than any other type of food. Eating carbs naturally raises the amount of glucose in your blood. Carb counting is a method for keeping track of how many carbs you eat. Counting carbs is important to keep your blood glucose at a healthy level, especially if you use insulin or take certain oral diabetes medicines. It is important to know how many carbs you can safely have in each meal. This is different for every person. Your dietitian can help you calculate how many carbs you should have at each meal and for each snack. How does alcohol affect me? Alcohol can cause a sudden decrease in blood glucose (hypoglycemia), especially if you use insulin or take certain oral diabetes medicines. Hypoglycemia can be a life-threatening condition. Symptoms of hypoglycemia, such as sleepiness, dizziness, and confusion, are similar to symptoms of having too much  alcohol.  Do not drink alcohol if: ? Your health care provider tells you not to drink. ? You are pregnant, may be pregnant, or are planning to become pregnant.  If you drink alcohol: ? Do not drink on an empty stomach. ? Limit how much you use to:  0-1 drink a day for women.  0-2 drinks a day for men. ? Be aware of how much alcohol is in your drink. In the [place] one drink equals one 12 oz bottle of beer (355 mL), one 5 oz glass of wine (148 mL), or one 1 oz glass of hard liquor (44 mL). ? Keep yourself hydrated with water, diet soda, or unsweetened iced tea.  Keep in mind that regular soda, juice, and other mixers may contain a lot of sugar and must be counted as carbs. What are tips for following this plan? Reading food labels  Start by checking the serving size on the "Nutrition Facts" label of packaged foods and drinks. The amount of calories, carbs, fats, and other nutrients listed on the label is based on one serving of the item. Many items contain more than one serving per package.  Check the total grams (g) of carbs in one serving. You can calculate the number of servings of carbs in one serving by dividing the total carbs by 15. For example, if a food has 30 g of total carbs per serving, it would be equal to 2 servings of carbs.  Check the number of grams (g) of saturated fats and trans fats in one serving. Choose foods that have   a low amount or none of these fats.  Check the number of milligrams (mg) of salt (sodium) in one serving. Most people should limit total sodium intake to less than 2,300 mg per day.  Always check the nutrition information of foods labeled as "low-fat" or "nonfat." These foods may be higher in added sugar or refined carbs and should be avoided.  Talk to your dietitian to identify your daily goals for nutrients listed on the label. Shopping  Avoid buying canned, pre-made, or processed foods. These foods tend to be high in fat, sodium, and added  sugar.  Shop around the outside edge of the grocery store. This is where you will most often find fresh fruits and vegetables, bulk grains, fresh meats, and fresh dairy. Cooking  Use low-heat cooking methods, such as baking, instead of high-heat cooking methods like deep frying.  Cook using healthy oils, such as olive, canola, or sunflower oil.  Avoid cooking with butter, cream, or high-fat meats. Meal planning  Eat meals and snacks regularly, preferably at the same times every day. Avoid going long periods of time without eating.  Eat foods that are high in fiber, such as fresh fruits, vegetables, beans, and whole grains. Talk with your dietitian about how many servings of carbs you can eat at each meal.  Eat 4-6 oz (112-168 g) of lean protein each day, such as lean meat, chicken, fish, eggs, or tofu. One ounce (oz) of lean protein is equal to: ? 1 oz (28 g) of meat, chicken, or fish. ? 1 egg. ?  cup (62 g) of tofu.  Eat some foods each day that contain healthy fats, such as avocado, nuts, seeds, and fish.   What foods should I eat? Fruits Berries. Apples. Oranges. Peaches. Apricots. Plums. Grapes. Mango. Papaya. Pomegranate. Kiwi. Cherries. Vegetables Lettuce. Spinach. Leafy greens, including kale, chard, collard greens, and mustard greens. Beets. Cauliflower. Cabbage. Broccoli. Carrots. Green beans. Tomatoes. Peppers. Onions. Cucumbers. Brussels sprouts. Grains Whole grains, such as whole-wheat or whole-grain bread, crackers, tortillas, cereal, and pasta. Unsweetened oatmeal. Quinoa. Brown or wild rice. Meats and other proteins Seafood. Poultry without skin. Lean cuts of poultry and beef. Tofu. Nuts. Seeds. Dairy Low-fat or fat-free dairy products such as milk, yogurt, and cheese. The items listed above may not be a complete list of foods and beverages you can eat. Contact a dietitian for more information. What foods should I avoid? Fruits Fruits canned with  syrup. Vegetables Canned vegetables. Frozen vegetables with butter or cream sauce. Grains Refined white flour and flour products such as bread, pasta, snack foods, and cereals. Avoid all processed foods. Meats and other proteins Fatty cuts of meat. Poultry with skin. Breaded or fried meats. Processed meat. Avoid saturated fats. Dairy Full-fat yogurt, cheese, or milk. Beverages Sweetened drinks, such as soda or iced tea. The items listed above may not be a complete list of foods and beverages you should avoid. Contact a dietitian for more information. Questions to ask a health care provider  Do I need to meet with a diabetes educator?  Do I need to meet with a dietitian?  What number can I call if I have questions?  When are the best times to check my blood glucose? Where to find more information:  American Diabetes Association: diabetes.org  Academy of Nutrition and Dietetics: www.eatright.org  National Institute of Diabetes and Digestive and Kidney Diseases: www.niddk.nih.gov  Association of Diabetes Care and Education Specialists: www.diabeteseducator.org Summary  It is important to have healthy eating   habits because your blood sugar (glucose) levels are greatly affected by what you eat and drink.  A healthy meal plan will help you control your blood glucose and maintain a healthy lifestyle.  Your health care provider may recommend that you work with a dietitian to make a meal plan that is best for you.  Keep in mind that carbohydrates (carbs) and alcohol have immediate effects on your blood glucose levels. It is important to count carbs and to use alcohol carefully. This information is not intended to replace advice given to you by your health care provider. Make sure you discuss any questions you have with your health care provider. Document Revised: 06/15/2019 Document Reviewed: 06/15/2019 [employer]vier Patient Education  2021 [employer]  

## 2020-10-26 ENCOUNTER — Other Ambulatory Visit: Payer: Self-pay | Admitting: Family

## 2020-10-26 DIAGNOSIS — E1169 Type 2 diabetes mellitus with other specified complication: Secondary | ICD-10-CM

## 2020-10-26 LAB — BMP8+EGFR
BUN/Creatinine Ratio: 15 (ref 10–24)
BUN: 15 mg/dL (ref 8–27)
CO2: 25 mmol/L (ref 20–29)
Calcium: 9.7 mg/dL (ref 8.6–10.2)
Chloride: 101 mmol/L (ref 96–106)
Creatinine, Ser: 1.01 mg/dL (ref 0.76–1.27)
Glucose: 107 mg/dL — ABNORMAL HIGH (ref 65–99)
Potassium: 4.9 mmol/L (ref 3.5–5.2)
Sodium: 143 mmol/L (ref 134–144)
eGFR: 85 mL/min/{1.73_m2} (ref >59)

## 2020-11-04 ENCOUNTER — Other Ambulatory Visit: Payer: Self-pay | Admitting: Family

## 2020-11-04 DIAGNOSIS — I10 Essential (primary) hypertension: Secondary | ICD-10-CM

## 2020-11-06 ENCOUNTER — Encounter: Payer: Self-pay | Admitting: Gastroenterology

## 2020-11-07 ENCOUNTER — Telehealth: Payer: Self-pay | Admitting: Family Medicine

## 2020-11-07 ENCOUNTER — Other Ambulatory Visit: Payer: Self-pay | Admitting: Family

## 2020-11-07 ENCOUNTER — Ambulatory Visit (INDEPENDENT_AMBULATORY_CARE_PROVIDER_SITE_OTHER): Payer: Medicare Other

## 2020-11-07 VITALS — Ht 68.0 in | Wt 180.0 lb

## 2020-11-07 DIAGNOSIS — Z0000 Encounter for general adult medical examination without abnormal findings: Secondary | ICD-10-CM | POA: Diagnosis not present

## 2020-11-07 DIAGNOSIS — Z794 Long term (current) use of insulin: Secondary | ICD-10-CM

## 2020-11-07 MED ORDER — FREESTYLE LIBRE SENSOR SYSTEM MISC: 0 refills | Status: DC

## 2020-11-07 MED ORDER — FREESTYLE LIBRE READER DEVI
1.0000 | Freq: Four times a day (QID) | 6 refills | Status: DC
Start: 2020-11-07 — End: 2020-11-10

## 2020-11-07 NOTE — Progress Notes (Signed)
Subjective:   Thomas Pratt is a 60 y.o. male who presents for Medicare Annual/Subsequent preventive examination.  Virtual Visit via Telephone Note  I connected with  Thomas Pratt on 11/07/20 at 11:15 AM EDT by telephone and verified that I am speaking with the correct person using two identifiers.  Location: Patient: Home Provider: [facility] Persons participating in the virtual visit: patient/Nurse Health Advisor   I discussed the limitations, risks, security and privacy concerns of performing an evaluation and management service by telephone and the availability of in person appointments. The patient expressed understanding and agreed to proceed.  Interactive audio and video telecommunications were attempted between this nurse and patient, however failed, due to patient having technical difficulties OR patient did not have access to video capability.  We continued and completed visit with audio only.  Some vital signs may be absent or patient reported.   Thomas Pratt E Thomas Wittwer, LPN   Review of Systems     Cardiac Risk Factors include: advanced age (>72mn, >>19women);diabetes mellitus;male gender;dyslipidemia;hypertension     Objective:    Today's Vitals   11/07/20 1050  Weight: 180 lb (81.6 kg)  Height: '5\' 8"'  (1.727 m)  PainSc: 3    Body mass index is 27.37 kg/m.  Advanced Directives 11/07/2020 12/23/2019 12/22/2019 11/05/2019 05/03/2019 11/08/2018 08/05/2018  Does Patient Have a Medical Advance Directive? No No No No No No No  Would patient like information on creating a medical advance directive? Yes (MAU/Ambulatory/Procedural Areas - Information given) No - Patient declined No - Patient declined No - Patient declined No - Patient declined No - Patient declined No - Patient declined    Current Medications (verified) Outpatient Encounter Medications as of 11/07/2020  Medication Sig  . Accu-Chek Softclix Lancets lancets Test BS 4 (four) times daily. Dx E11.69 (Patient taking  differently: 1 each by Other route 4 (four) times daily as needed. Test BS 4 (four) times daily. Dx E11.69)  . atorvastatin (LIPITOR) 10 MG tablet TAKE 1 TABLET BY MOUTH  DAILY  . blood glucose meter kit and supplies Dispense based on patient and insurance preference. Use up to four times daily as directed. (FOR ICD-10 E10.9, E11.9). (Patient taking differently: 1 each by Other route once. Dispense based on patient and insurance preference. Use up to four times daily as directed. (FOR ICD-10 E10.9, E11.9).)  . Blood Glucose Monitoring Suppl (ACCU-CHEK GUIDE) w/Device KIT Test BS 4 (four) times daily. Dx E11.69  . cetirizine (ZYRTEC) 10 MG tablet Take 1 tablet (10 mg total) by mouth daily.  . dapagliflozin propanediol (FARXIGA) 10 MG TABS tablet Take 1 tablet (10 mg total) by mouth daily before breakfast.  . dexlansoprazole (DEXILANT) 60 MG capsule Take 1 capsule (60 mg total) by mouth daily.  .Marland Kitchenescitalopram (LEXAPRO) 10 MG tablet Take 1 tablet (10 mg total) by mouth daily.  . fluticasone (FLONASE) 50 MCG/ACT nasal spray Place 2 sprays into both nostrils daily.  . furosemide (LASIX) 20 MG tablet Take 1 tablet (20 mg total) by mouth daily.  .Marland Kitchengabapentin (NEURONTIN) 300 MG capsule Take 1 capsule by mouth at bedtime  . glucose blood (ACCU-CHEK GUIDE) test strip Test BS 4 (four) times daily. Dx E11.69 (Patient taking differently: 1 each by Other route 4 (four) times daily as needed. Test BS 4 (four) times daily. Dx E11.69)  . insulin degludec (TRESIBA FLEXTOUCH) 100 UNIT/ML FlexTouch Pen Inject 25 Units into the skin at bedtime.  . Insulin Pen Needle (B-D UF III MINI PEN NEEDLES)  31G X 5 MM MISC USE WITH TRESIBA daily Dx E11.69 (Patient taking differently: Inject 1 each into the skin daily. USE WITH TRESIBA daily Dx E11.69)  . metFORMIN (GLUCOPHAGE-XR) 750 MG 24 hr tablet TAKE 2 TABLETS BY MOUTH ONCE DAILY WITH BREAKFAST  . nadolol (CORGARD) 40 MG tablet Take 1 tablet (40 mg total) by mouth daily.  .  ondansetron (ZOFRAN) 4 MG tablet Take 1 tablet (4 mg total) by mouth every 6 (six) hours as needed for nausea.  Marland Kitchen spironolactone (ALDACTONE) 50 MG tablet Take 1 tablet by mouth once daily  . VITAMIN D PO Take 1 capsule by mouth daily.   . rifaximin (XIFAXAN) 550 MG TABS tablet Take 1 tablet (550 mg total) by mouth 2 (two) times daily. (Patient not taking: Reported on 11/07/2020)   No facility-administered encounter medications on file as of 11/07/2020.    Allergies (verified) Patient has no known allergies.   History: Past Medical History:  Diagnosis Date  . Cirrhosis ([place])    likely ETOH use   . Diabetes ([place])   . Heat stroke 2016/17   Past Surgical History:  Procedure Laterality Date  . CHOLECYSTECTOMY N/A 09/01/2017   Procedure: LAPAROSCOPIC CHOLECYSTECTOMY;  Surgeon: Aviva Signs, MD;  Location: [facility];  Service: General;  Laterality: N/A;  . COLONOSCOPY WITH PROPOFOL N/A 03/06/2017   Dr. Gala Romney: tubular adenoma removed, next TCS 5 years if health permits.   . ESOPHAGOGASTRODUODENOSCOPY (EGD) WITH PROPOFOL N/A 03/06/2017   Dr. Gala Romney: grade 1 esophageal varices, erosive esophagitis, portal gastropathy.   . ESOPHAGOGASTRODUODENOSCOPY (EGD) WITH PROPOFOL N/A 05/03/2019   Procedure: ESOPHAGOGASTRODUODENOSCOPY (EGD) WITH PROPOFOL;  Surgeon: Daneil Dolin, MD;  Location[facility];  Service: Endoscopy;  Laterality: N/A;  10:30am  . ESOPHAGOGASTRODUODENOSCOPY (EGD) WITH PROPOFOL N/A 12/23/2019   Procedure: ESOPHAGOGASTRODUODENOSCOPY (EGD) WITH PROPOFOL Possible esophageal variceal banding.;  Surgeon: Daneil Dolin, MD;  [facility];  Service: Endoscopy;  Laterality: N/A;  . HERNIA REPAIR Right    RIH  . INGUINAL HERNIA REPAIR Right 07/11/2017   Procedure: HERNIA REPAIR INGUINAL ADULT WITH MESH;  Surgeon: Aviva Signs, MD;  Location: [facility];  Service: General;  Laterality: Right;  patient knows to arrive at 7:30  . IR TRANSCATHETER BX  10/14/2017  . IR US GUIDE VASC  ACCESS RIGHT  10/14/2017  . IR VENOGRAM HEPATIC WO HEMODYNAMIC EVALUATION  10/14/2017  . MALONEY DILATION N/A 05/03/2019   Procedure: Venia Minks DILATION;  Surgeon: Daneil Dolin, MD;  [facility];  Service: Endoscopy;  Laterality: N/A;  . POLYPECTOMY  03/06/2017   Procedure: POLYPECTOMY;  Surgeon: Daneil Dolin, MD;  [facility];  Service: Endoscopy;;  ascending colon;   Family History  Problem Relation Age of Onset  . COPD Mother   . Liver disease Neg Hx   . Colon cancer Neg Hx   . Colon polyps Neg Hx    Social History   Socioeconomic History  . Marital status: Divorced    Spouse name: Not on file  . Number of children: Not on file  . Years of education: Not on file  . Highest education level: Not on file  Occupational History  . Occupation: unemployed    Comment: disability  Tobacco Use  . Smoking status: Never Smoker  . Smokeless tobacco: Never Used  Vaping Use  . Vaping Use: Never used  Substance and Sexual Activity  . Alcohol use: No    Comment: Quit September 2017, heavy alcohol  abuse previously  . Drug use: No  . Sexual activity: Not Currently    Partners: Female  Other Topics Concern  . Not on file  Social History Narrative   Lives in [place], [place] with his youngest daughter; 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    Drank alcohol for years, quit 05/07/16. Has been sober since then. Quit b/c of health/cirrhosis.   Divorced for over 15 years.   Dating sporadically.   Has 3 daughters and 1 son live locally.   Originally from [place], moved here b/c of wife.    Eats all foods.    Wear seatbelt.   Does not drive, does not have a license, lost due to DWI.    Brought by neighbor.    Does not go to church. Believes in God. Prays each night, per report.    Social Determinants of Health   Financial Resource Strain: Low Risk   . Difficulty of Paying Living Expenses: Not hard at all  Food Insecurity: No Food Insecurity  . Worried  About Charity fundraiser in the Last Year: Never true  . Ran Out of Food in the Last Year: Never true  Transportation Needs: No Transportation Needs  . Lack of Transportation (Medical): No  . Lack of Transportation (Non-Medical): No  Physical Activity: Sufficiently Active  . Days of Exercise per Week: 7 days  . Minutes of Exercise per Session: 60 min  Stress: No Stress Concern Present  . Feeling of Stress : Only a little  Social Connections: Socially Isolated  . Frequency of Communication with Friends and Family: More than three times a week  . Frequency of Social Gatherings with Friends and Family: More than three times a week  . Attends Religious Services: Never  . Active Member of Clubs or Organizations: No  . Attends Archivist Meetings: Never  . Marital Status: Divorced    Tobacco Counseling Counseling given: No   Clinical Intake:  Pre-visit preparation completed: Yes  Pain : 0-10 Pain Score: 3  Pain Type: Acute pain Pain Location: Foot Pain Orientation: Right,Left Pain Descriptors / Indicators: Sore,Shooting Pain Onset: In the past 7 days Pain Frequency: Several days a week     BMI - recorded: 27.37 Nutritional Status: BMI 25 -29 Overweight Nutritional Risks: None Diabetes: Yes CBG done?: No Did pt. bring in CBG monitor from home?: No  How often do you need to have someone help you when you read instructions, pamphlets, or other written materials from your doctor or pharmacy?: 1 - Never  Nutrition Risk Assessment:  Has the patient had any N/V/D within the last 2 months?  No  Does the patient have any non-healing wounds?  No  Has the patient had any unintentional weight loss or weight gain?  No   Diabetes:  Is the patient diabetic?  Yes  If diabetic, was a CBG obtained today?  No  Did the patient bring in their glucometer from home?  No  How often do you monitor your CBG's? QID. - 149 this morning fasting per patient.  Financial Strains and  Diabetes Management:  Are you having any financial strains with the device, your supplies or your medication? No .  Does the patient want to be seen by Chronic Care Management for management of their diabetes?  No  Would the patient like to be referred to a Nutritionist or for Diabetic Management?  No   Diabetic Exams:  Diabetic Eye Exam: Completed 08/14/20.  Diabetic Foot Exam: Completed 07/13/2020. Pt has been advised about the importance in completing this exam. Pt is scheduled for diabetic foot exam on 11/2020.    Interpreter Needed?: No  Information entered by :: Janvi Ammar, LPN   Activities of Daily Living In your present state of health, do you have any difficulty performing the following activities: 11/07/2020 12/22/2019  Hearing? N N  Vision? N N  Difficulty concentrating or making decisions? N N  Walking or climbing stairs? N N  Dressing or bathing? N N  Doing errands, shopping? N N  Preparing Food and eating ? N -  Using the Toilet? N -  In the past six months, have you accidently leaked urine? N -  Do you have problems with loss of bowel control? N -  Managing your Medications? N -  Managing your Finances? N -  Housekeeping or managing your Housekeeping? N -  Some recent data might be hidden    Patient Care Team: Sharion Balloon, FNP as PCP - General (Family Medicine) Gala Romney, Cristopher Estimable, MD as Consulting Physician (Gastroenterology) Lavera Guise, [facility] (Pharmacist)  Indicate any recent Medical Services you may have received from other than [facility] providers in the past year (date may be approximate).     Assessment:   This is a routine wellness examination for [place].  Hearing/Vision screen  Hearing Screening   '125Hz'  '250Hz'  '500Hz'  '1000Hz'  '2000Hz'  '3000Hz'  '4000Hz'  '6000Hz'  '8000Hz'   Right ear:           Left ear:           Comments: Denies hearing difficulties  Vision Screening Comments: Wears reading glasses - Semi-annual visits with [facility] in [place]  Dietary  issues and exercise activities discussed: Current Exercise Habits: Home exercise routine, Type of exercise: calisthenics;walking, Time (Minutes): 60, Frequency (Times/Week): 7, Weekly Exercise (Minutes/Week): 420, Intensity: Moderate  Goals    . Client will verbalize knowledge of diabetes self-management as evidenced by Hgb A1C <7 or as defined by provider.     Diabetes self management actions:  Glucose monitoring per provider recommendations  Perform Quality checks on blood meter  Eat Healthy  Check feet daily  Visit provider every 3-6 months as directed  Hbg A1C level every 3-6 months.  Eye Exam yearly    . DIET - INCREASE WATER INTAKE      Depression Screen PHQ 2/9 Scores 10/25/2020 09/07/2020 07/13/2020 01/10/2020 11/05/2019 10/08/2019 09/03/2019  PHQ - 2 Score 0 - 0 1 0 0 0  PHQ- 9 Score 0 - - 2 - - 2  Exception Documentation - Patient refusal - - - - -    Fall Risk Fall Risk  11/07/2020 10/25/2020 09/07/2020 07/13/2020 01/10/2020  Falls in the past year? 1 0 1 0 0  Number falls in past yr: 0 - 0 - -  Injury with Fall? 0 - 1 - -  Risk for fall due to : History of fall(s);Impaired balance/gait;Medication side effect - History of fall(s) - -  Follow up Falls prevention discussed - Falls evaluation completed - -    FALL RISK PREVENTION PERTAINING TO THE HOME:  Any stairs in or around the home? Yes  If so, are there any without handrails? No  Home free of loose throw rugs in walkways, pet beds, electrical cords, etc? Yes  Adequate lighting in your home to reduce risk of falls? Yes   ASSISTIVE DEVICES UTILIZED TO PREVENT FALLS:  Life alert? No  Use of a cane, walker or  w/c? No  Grab bars in the bathroom? Yes  Shower chair or bench in shower? No  Elevated toilet seat or a handicapped toilet? No   TIMED UP AND GO:  Was the test performed? No . Telephonic visit.  Cognitive Function: Normal cognitive status assessed by direct observation by this Nurse Health Advisor. No  abnormalities found.       6CIT Screen 11/05/2019  What Year? 0 points  What month? 0 points  What time? 0 points  Count back from 20 0 points  Months in reverse 2 points  Repeat phrase 10 points  Total Score 12    Immunizations Immunization History  Administered Date(s) Administered  . Influenza,inj,Quad PF,6+ Mos 04/03/2018, 05/20/2019, 04/12/2020  . Moderna Sars-Covid-2 Vaccination 11/17/2019, 12/15/2019  . Pneumococcal Conjugate-13 09/26/2017  . Pneumococcal Polysaccharide-23 03/03/2018  . Tdap 09/26/2017    TDAP status: Up to date  Flu Vaccine status: Up to date  Pneumococcal vaccine status: Up to date  Covid-19 vaccine status: Completed vaccines  Qualifies for Shingles Vaccine? Yes   Zostavax completed No   Shingrix Completed?: No.    Education has been provided regarding the importance of this vaccine. Patient has been advised to call insurance company to determine out of pocket expense if they have not yet received this vaccine. Advised may also receive vaccine at local pharmacy or Health Dept. Verbalized acceptance and understanding.  Screening Tests Health Maintenance  Topic Date Due  . COVID-19 Vaccine (3 - Booster for Moderna series) 11/10/2020 (Originally 06/16/2020)  . INFLUENZA VACCINE  02/19/2021  . URINE MICROALBUMIN  04/12/2021  . HEMOGLOBIN A1C  04/26/2021  . FOOT EXAM  07/13/2021  . OPHTHALMOLOGY EXAM  08/14/2021  . COLONOSCOPY (Pts 45-28yr Insurance coverage will need to be confirmed)  03/08/2027  . TETANUS/TDAP  09/27/2027  . PNEUMOCOCCAL POLYSACCHARIDE VACCINE AGE 44-64 HIGH RISK  Completed  . Hepatitis C Screening  Completed  . HIV Screening  Completed  . HPV VACCINES  Aged Out    Health Maintenance  There are no preventive care reminders to display for this patient.  Colorectal cancer screening: Type of screening: Colonoscopy. Completed 03/07/2017. Repeat every 10 years  Lung Cancer Screening: (Low Dose CT Chest recommended if Age  61-80years, 30 pack-year currently smoking OR have quit w/in 15years.) does not qualify.   Additional Screening:  Hepatitis C Screening: does qualify; Completed 05/21/2016  Vision Screening: Recommended annual ophthalmology exams for early detection of glaucoma and other disorders of the eye. Is the patient up to date with their annual eye exam?  Yes  Who is the provider or what is the name of the office in which the patient attends annual [place] exams[place] [facility] [place] not [place] a provider, would they like to be referred to a provider to establish care? No .   Dental Screening: Recommended annual dental exams for proper oral hygiene  Community Resource Referral / Chronic Care Management: CRR required this visit?  No   CCM required this visit?  No      Plan:     I have personally reviewed and noted the following in the patient's chart:   . Medical and social history . Use of alcohol, tobacco or illicit drugs  . Current medications and supplements . Functional ability and status . Nutritional status . Physical activity . Advanced directives . List of other physicians . Hospitalizations, surgeries, and ER visits in previous 12 months . Vitals . Screenings to include cognitive,  depression, and falls . Referrals and appointments  In addition, I have reviewed and discussed with patient certain preventive protocols, quality metrics, and best practice recommendations. A written personalized care plan for preventive services as well as general preventive health recommendations were provided to patient.     Sandrea Hammond, LPN   04/04/5601   Nurse Notes: For the past week, the bottom of his feet hurt when he walks and they look reddish-purple. Patient asking if there is anything he can do for this? Also, can we send in [facility] and see if his insurance covers this? He checks his sugar 4x per day.

## 2020-11-07 NOTE — Progress Notes (Signed)
Freestyle Libre Prescription sent to pharmacy.  Pain in feet is from neuropathy pain.

## 2020-11-07 NOTE — Patient Instructions (Signed)
[name] , Thank you for taking time to come for your Medicare Wellness Visit. I appreciate your ongoing commitment to your health goals. Please review the following plan we discussed and let me know if I can assist you in the future.   Screening recommendations/referrals: Colonoscopy: Done 03/07/2017 - Repeat in 10 years Recommended yearly ophthalmology/optometry visit for glaucoma screening and checkup Recommended yearly dental visit for hygiene and checkup  Vaccinations: Influenza vaccine: Done 04/12/2020 -Repeat every year Pneumococcal vaccine: Done 09/26/2017 & 03/03/2018 Tdap vaccine: Done 09/26/2017 Shingles vaccine: Shingrix discussed. Please contact your pharmacy for coverage information.    Covid-19: Done 11/17/2019 & 12/15/2019  Advanced directives: Advance directive discussed with you today. I have provided a copy for you to complete at home and have notarized. Once this is complete please bring a copy in to our office so we can scan it into your chart.  Conditions/risks identified: Continue staying active, walking, drinking plenty of water; watch sugars and carbs  Next appointment: Follow up in one year for your annual wellness visit   Preventive Care 40-64 Years, Male Preventive care refers to lifestyle choices and visits with your health care provider that can promote health and wellness. What does preventive care include?  A yearly physical exam. This is also called an annual well check.  Dental exams once or twice a year.  Routine eye exams. Ask your health care provider how often you should have your eyes checked.  Personal lifestyle choices, including:  Daily care of your teeth and gums.  Regular physical activity.  Eating a healthy diet.  Avoiding tobacco and drug use.  Limiting alcohol use.  Practicing safe sex.  Taking low-dose aspirin every day starting at age 50. What happens during an annual well check? The services and screenings done by your  health care provider during your annual well check will depend on your age, overall health, lifestyle risk factors, and family history of disease. Counseling  Your health care provider may ask you questions about your:  Alcohol use.  Tobacco use.  Drug use.  Emotional well-being.  Home and relationship well-being.  Sexual activity.  Eating habits.  Work and work Statistician. Screening  You may have the following tests or measurements:  Height, weight, and BMI.  Blood pressure.  Lipid and cholesterol levels. These may be checked every 5 years, or more frequently if you are over 35 years old.  Skin check.  Lung cancer screening. You may have this screening every year starting at age 59 if you have a 30-pack-year history of smoking and currently smoke or have quit within the past 15 years.  Fecal occult blood test (FOBT) of the stool. You may have this test every year starting at age 84.  Flexible sigmoidoscopy or colonoscopy. You may have a sigmoidoscopy every 5 years or a colonoscopy every 10 years starting at age 74.  Prostate cancer screening. Recommendations will vary depending on your family history and other risks.  Hepatitis C blood test.  Hepatitis B blood test.  Sexually transmitted disease (STD) testing.  Diabetes screening. This is done by checking your blood sugar (glucose) after you have not eaten for a while (fasting). You may have this done every 1-3 years. Discuss your test results, treatment options, and if necessary, the need for more tests with your health care provider. Vaccines  Your health care provider may recommend certain vaccines, such as:  Influenza vaccine. This is recommended every year.  Tetanus, diphtheria, and acellular pertussis (Tdap,  Td) vaccine. You may need a Td booster every 10 years.  Zoster vaccine. You may need this after age 58.  Pneumococcal 13-valent conjugate (PCV13) vaccine. You may need this if you have certain  conditions and have not been vaccinated.  Pneumococcal polysaccharide (PPSV23) vaccine. You may need one or two doses if you smoke cigarettes or if you have certain conditions. Talk to your health care provider about which screenings and vaccines you need and how often you need them. This information is not intended to replace advice given to you by your health care provider. Make sure you discuss any questions you have with your health care provider. Document Released: 08/04/2015 Document Revised: 03/27/2016 Document Reviewed: 05/09/2015 Elsevier Interactive Patient Education  2017 [place] Prevention in the Home Falls can cause injuries. They can happen to people of all ages. There are many things you can do to make your home safe and to help prevent falls. What can I do on the outside of my home?  Regularly fix the edges of walkways and driveways and fix any cracks.  Remove anything that might make you trip as you walk through a door, such as a raised step or threshold.  Trim any bushes or trees on the path to your home.  Use bright outdoor lighting.  Clear any walking paths of anything that might make someone trip, such as rocks or tools.  Regularly check to see if handrails are loose or broken. Make sure that both sides of any steps have handrails.  Any raised decks and porches should have guardrails on the edges.  Have any leaves, snow, or ice cleared regularly.  Use sand or salt on walking paths during winter.  Clean up any spills in your garage right away. This includes oil or grease spills. What can I do in the bathroom?  Use night lights.  Install grab bars by the toilet and in the tub and shower. Do not use towel bars as grab bars.  Use non-skid mats or decals in the tub or shower.  If you need to sit down in the shower, use a plastic, non-slip stool.  Keep the floor dry. Clean up any water that spills on the floor as soon as it happens.  Remove soap  buildup in the tub or shower regularly.  Attach bath mats securely with double-sided non-slip rug tape.  Do not have throw rugs and other things on the floor that can make you trip. What can I do in the bedroom?  Use night lights.  Make sure that you have a light by your bed that is easy to reach.  Do not use any sheets or blankets that are too big for your bed. They should not hang down onto the floor.  Have a firm chair that has side arms. You can use this for support while you get dressed.  Do not have throw rugs and other things on the floor that can make you trip. What can I do in the kitchen?  Clean up any spills right away.  Avoid walking on wet floors.  Keep items that you use a lot in easy-to-reach places.  If you need to reach something above you, use a strong step stool that has a grab bar.  Keep electrical cords out of the way.  Do not use floor polish or wax that makes floors slippery. If you must use wax, use non-skid floor wax.  Do not have throw rugs and other things on  the floor that can make you trip. What can I do with my stairs?  Do not leave any items on the stairs.  Make sure that there are handrails on both sides of the stairs and use them. Fix handrails that are broken or loose. Make sure that handrails are as long as the stairways.  Check any carpeting to make sure that it is firmly attached to the stairs. Fix any carpet that is loose or worn.  Avoid having throw rugs at the top or bottom of the stairs. If you do have throw rugs, attach them to the floor with carpet tape.  Make sure that you have a light switch at the top of the stairs and the bottom of the stairs. If you do not have them, ask someone to add them for you. What else can I do to help prevent falls?  Wear shoes that:  Do not have high heels.  Have rubber bottoms.  Are comfortable and fit you well.  Are closed at the toe. Do not wear sandals.  If you use a stepladder:  Make  sure that it is fully opened. Do not climb a closed stepladder.  Make sure that both sides of the stepladder are locked into place.  Ask someone to hold it for you, if possible.  Clearly mark and make sure that you can see:  Any grab bars or handrails.  First and last steps.  Where the edge of each step is.  Use tools that help you move around (mobility aids) if they are needed. These include:  Canes.  Walkers.  Scooters.  Crutches.  Turn on the lights when you go into a dark area. Replace any light bulbs as soon as they burn out.  Set up your furniture so you have a clear path. Avoid moving your furniture around.  If any of your floors are uneven, fix them.  If there are any pets around you, be aware of where they are.  Review your medicines with your doctor. Some medicines can make you feel dizzy. This can increase your chance of falling. Ask your doctor what other things that you can do to help prevent falls. This information is not intended to replace advice given to you by your health care provider. Make sure you discuss any questions you have with your health care provider. Document Released: 05/04/2009 Document Revised: 12/14/2015 Document Reviewed: 08/12/2014 Elsevier Interactive Patient Education  2017 [employer].

## 2020-11-09 ENCOUNTER — Other Ambulatory Visit: Payer: Self-pay | Admitting: Family

## 2020-11-09 DIAGNOSIS — E1169 Type 2 diabetes mellitus with other specified complication: Secondary | ICD-10-CM

## 2020-11-09 DIAGNOSIS — Z794 Long term (current) use of insulin: Secondary | ICD-10-CM

## 2020-11-16 ENCOUNTER — Other Ambulatory Visit: Payer: Self-pay | Admitting: Family

## 2020-11-16 DIAGNOSIS — E1169 Type 2 diabetes mellitus with other specified complication: Secondary | ICD-10-CM

## 2020-11-16 DIAGNOSIS — E785 Hyperlipidemia, unspecified: Secondary | ICD-10-CM

## 2020-11-27 ENCOUNTER — Other Ambulatory Visit: Payer: Self-pay

## 2020-11-27 ENCOUNTER — Encounter: Payer: Self-pay | Admitting: Family

## 2020-11-27 ENCOUNTER — Ambulatory Visit (INDEPENDENT_AMBULATORY_CARE_PROVIDER_SITE_OTHER): Payer: Medicare Other | Admitting: Family

## 2020-11-27 VITALS — BP 114/72 | HR 62 | Temp 98.0°F | Ht 68.0 in | Wt 185.2 lb

## 2020-11-27 DIAGNOSIS — E1149 Type 2 diabetes mellitus with other diabetic neurological complication: Secondary | ICD-10-CM | POA: Diagnosis not present

## 2020-11-27 DIAGNOSIS — E1169 Type 2 diabetes mellitus with other specified complication: Secondary | ICD-10-CM

## 2020-11-27 DIAGNOSIS — I10 Essential (primary) hypertension: Secondary | ICD-10-CM

## 2020-11-27 DIAGNOSIS — F320 Major depressive disorder, single episode, mild: Secondary | ICD-10-CM

## 2020-11-27 DIAGNOSIS — K7030 Alcoholic cirrhosis of liver without ascites: Secondary | ICD-10-CM

## 2020-11-27 DIAGNOSIS — F1011 Alcohol abuse, in remission: Secondary | ICD-10-CM

## 2020-11-27 DIAGNOSIS — Z23 Encounter for immunization: Secondary | ICD-10-CM

## 2020-11-27 DIAGNOSIS — K7460 Unspecified cirrhosis of liver: Secondary | ICD-10-CM

## 2020-11-27 DIAGNOSIS — Z794 Long term (current) use of insulin: Secondary | ICD-10-CM | POA: Diagnosis not present

## 2020-11-27 MED ORDER — GABAPENTIN 400 MG PO CAPS: 400.0000 mg | ORAL_CAPSULE | Freq: Two times a day (BID) | ORAL | 2 refills | Status: DC

## 2020-11-27 NOTE — Patient Instructions (Signed)
Diabetes Mellitus and Nutrition, Adult When you have diabetes, or diabetes mellitus, it is very important to have healthy eating habits because your blood sugar (glucose) levels are greatly affected by what you eat and drink. Eating healthy foods in the right amounts, at about the same times every day, can help you:  Control your blood glucose.  Lower your risk of heart disease.  Improve your blood pressure.  Reach or maintain a healthy weight. What can affect my meal plan? Every person with diabetes is different, and each person has different needs for a meal plan. Your health care provider may recommend that you work with a dietitian to make a meal plan that is best for you. Your meal plan may vary depending on factors such as:  The calories you need.  The medicines you take.  Your weight.  Your blood glucose, blood pressure, and cholesterol levels.  Your activity level.  Other health conditions you have, such as heart or kidney disease. How do carbohydrates affect me? Carbohydrates, also called carbs, affect your blood glucose level more than any other type of food. Eating carbs naturally raises the amount of glucose in your blood. Carb counting is a method for keeping track of how many carbs you eat. Counting carbs is important to keep your blood glucose at a healthy level, especially if you use insulin or take certain oral diabetes medicines. It is important to know how many carbs you can safely have in each meal. This is different for every person. Your dietitian can help you calculate how many carbs you should have at each meal and for each snack. How does alcohol affect me? Alcohol can cause a sudden decrease in blood glucose (hypoglycemia), especially if you use insulin or take certain oral diabetes medicines. Hypoglycemia can be a life-threatening condition. Symptoms of hypoglycemia, such as sleepiness, dizziness, and confusion, are similar to symptoms of having too much  alcohol.  Do not drink alcohol if: ? Your health care provider tells you not to drink. ? You are pregnant, may be pregnant, or are planning to become pregnant.  If you drink alcohol: ? Do not drink on an empty stomach. ? Limit how much you use to:  0-1 drink a day for women.  0-2 drinks a day for men. ? Be aware of how much alcohol is in your drink. In the [place] one drink equals one 12 oz bottle of beer (355 mL), one 5 oz glass of wine (148 mL), or one 1 oz glass of hard liquor (44 mL). ? Keep yourself hydrated with water, diet soda, or unsweetened iced tea.  Keep in mind that regular soda, juice, and other mixers may contain a lot of sugar and must be counted as carbs. What are tips for following this plan? Reading food labels  Start by checking the serving size on the "Nutrition Facts" label of packaged foods and drinks. The amount of calories, carbs, fats, and other nutrients listed on the label is based on one serving of the item. Many items contain more than one serving per package.  Check the total grams (g) of carbs in one serving. You can calculate the number of servings of carbs in one serving by dividing the total carbs by 15. For example, if a food has 30 g of total carbs per serving, it would be equal to 2 servings of carbs.  Check the number of grams (g) of saturated fats and trans fats in one serving. Choose foods that have   a low amount or none of these fats.  Check the number of milligrams (mg) of salt (sodium) in one serving. Most people should limit total sodium intake to less than 2,300 mg per day.  Always check the nutrition information of foods labeled as "low-fat" or "nonfat." These foods may be higher in added sugar or refined carbs and should be avoided.  Talk to your dietitian to identify your daily goals for nutrients listed on the label. Shopping  Avoid buying canned, pre-made, or processed foods. These foods tend to be high in fat, sodium, and added  sugar.  Shop around the outside edge of the grocery store. This is where you will most often find fresh fruits and vegetables, bulk grains, fresh meats, and fresh dairy. Cooking  Use low-heat cooking methods, such as baking, instead of high-heat cooking methods like deep frying.  Cook using healthy oils, such as olive, canola, or sunflower oil.  Avoid cooking with butter, cream, or high-fat meats. Meal planning  Eat meals and snacks regularly, preferably at the same times every day. Avoid going long periods of time without eating.  Eat foods that are high in fiber, such as fresh fruits, vegetables, beans, and whole grains. Talk with your dietitian about how many servings of carbs you can eat at each meal.  Eat 4-6 oz (112-168 g) of lean protein each day, such as lean meat, chicken, fish, eggs, or tofu. One ounce (oz) of lean protein is equal to: ? 1 oz (28 g) of meat, chicken, or fish. ? 1 egg. ?  cup (62 g) of tofu.  Eat some foods each day that contain healthy fats, such as avocado, nuts, seeds, and fish.   What foods should I eat? Fruits Berries. Apples. Oranges. Peaches. Apricots. Plums. Grapes. Mango. Papaya. Pomegranate. Kiwi. Cherries. Vegetables Lettuce. Spinach. Leafy greens, including kale, chard, collard greens, and mustard greens. Beets. Cauliflower. Cabbage. Broccoli. Carrots. Green beans. Tomatoes. Peppers. Onions. Cucumbers. Brussels sprouts. Grains Whole grains, such as whole-wheat or whole-grain bread, crackers, tortillas, cereal, and pasta. Unsweetened oatmeal. Quinoa. Brown or wild rice. Meats and other proteins Seafood. Poultry without skin. Lean cuts of poultry and beef. Tofu. Nuts. Seeds. Dairy Low-fat or fat-free dairy products such as milk, yogurt, and cheese. The items listed above may not be a complete list of foods and beverages you can eat. Contact a dietitian for more information. What foods should I avoid? Fruits Fruits canned with  syrup. Vegetables Canned vegetables. Frozen vegetables with butter or cream sauce. Grains Refined white flour and flour products such as bread, pasta, snack foods, and cereals. Avoid all processed foods. Meats and other proteins Fatty cuts of meat. Poultry with skin. Breaded or fried meats. Processed meat. Avoid saturated fats. Dairy Full-fat yogurt, cheese, or milk. Beverages Sweetened drinks, such as soda or iced tea. The items listed above may not be a complete list of foods and beverages you should avoid. Contact a dietitian for more information. Questions to ask a health care provider  Do I need to meet with a diabetes educator?  Do I need to meet with a dietitian?  What number can I call if I have questions?  When are the best times to check my blood glucose? Where to find more information:  American Diabetes Association: diabetes.org  Academy of Nutrition and Dietetics: www.eatright.org  National Institute of Diabetes and Digestive and Kidney Diseases: www.niddk.nih.gov  Association of Diabetes Care and Education Specialists: www.diabeteseducator.org Summary  It is important to have healthy eating   habits because your blood sugar (glucose) levels are greatly affected by what you eat and drink.  A healthy meal plan will help you control your blood glucose and maintain a healthy lifestyle.  Your health care provider may recommend that you work with a dietitian to make a meal plan that is best for you.  Keep in mind that carbohydrates (carbs) and alcohol have immediate effects on your blood glucose levels. It is important to count carbs and to use alcohol carefully. This information is not intended to replace advice given to you by your health care provider. Make sure you discuss any questions you have with your health care provider. Document Revised: 06/15/2019 Document Reviewed: 06/15/2019 [employer]vier Patient Education  2021 [employer]  

## 2020-11-27 NOTE — Progress Notes (Signed)
Subjective:    Patient ID: Thomas Pratt, male    DOB: August 14, 1960, 60 y.o.   MRN: [id number]  Chief Complaint  Patient presents with  . Follow-up   PT presents to the office today for chronic follow up. He is followed by our clinical pharmacists for DM education.  Heis followed by GIfor cirrhosis of liver. He has been sober since 09/2017and needs to get counseling program to get on transplant list. PT is stable at this time. Diabetes He presents for his follow-up diabetic visit. He has type 2 diabetes mellitus. His disease course has been stable. There are no hypoglycemic associated symptoms. Associated symptoms include blurred vision and foot paresthesias. Symptoms are stable. Diabetic complications include peripheral neuropathy. Risk factors for coronary artery disease include dyslipidemia, diabetes mellitus, male sex, hypertension and sedentary lifestyle. He is following a generally unhealthy diet. His overall blood glucose range is 180-200 mg/dl. Eye exam is current.  Hypertension This is a chronic problem. The current episode started more than 1 year ago. The problem has been waxing and waning since onset. The problem is controlled. Associated symptoms include blurred vision, malaise/fatigue and peripheral edema. Pertinent negatives include no shortness of breath. Risk factors for coronary artery disease include dyslipidemia, obesity and male gender. The current treatment provides moderate improvement.  Gastroesophageal Reflux He complains of belching and heartburn. This is a chronic problem. The current episode started more than 1 year ago. The problem occurs occasionally. The problem has been waxing and waning. He has tried a PPI for the symptoms. The treatment provided moderate relief.  Hyperlipidemia This is a chronic problem. Pertinent negatives include no shortness of breath. Risk factors for coronary artery disease include dyslipidemia, male sex, hypertension and a sedentary  lifestyle.  Depression        This is a chronic problem.  The current episode started more than 1 year ago.   The onset quality is gradual.   The problem occurs intermittently.  Associated symptoms include irritable and restlessness.  Associated symptoms include no helplessness and no hopelessness.  Past treatments include SNRIs - Serotonin and norepinephrine reuptake inhibitors.     Review of Systems  Constitutional: Positive for malaise/fatigue.  Eyes: Positive for blurred vision.  Respiratory: Negative for shortness of breath.   Gastrointestinal: Positive for heartburn.  Psychiatric/Behavioral: Positive for depression.  All other systems reviewed and are negative.      Objective:   Physical Exam Vitals reviewed.  Constitutional:      General: He is irritable. He is not in acute distress.    Appearance: He is well-developed.  HENT:     Head: Normocephalic.     Right Ear: Tympanic membrane normal.     Left Ear: Tympanic membrane normal.  Eyes:     General:        Right eye: No discharge.        Left eye: No discharge.     Pupils: Pupils are equal, round, and reactive to light.  Neck:     Thyroid: No thyromegaly.  Cardiovascular:     Rate and Rhythm: Normal rate and regular rhythm.     Heart sounds: Normal heart sounds. No murmur heard.   Pulmonary:     Effort: Pulmonary effort is normal. No respiratory distress.     Breath sounds: Normal breath sounds. No wheezing.  Abdominal:     General: Bowel sounds are normal. There is no distension.     Palpations: Abdomen is soft.  Tenderness: There is no abdominal tenderness.  Musculoskeletal:        General: No tenderness. Normal range of motion.     Cervical back: Normal range of motion and neck supple.  Skin:    General: Skin is warm and dry.     Findings: No erythema or rash.  Neurological:     Mental Status: He is alert and oriented to person, place, and time.     Cranial Nerves: No cranial nerve deficit.      Deep Tendon Reflexes: Reflexes are normal and symmetric.  Psychiatric:        Behavior: Behavior normal.        Thought Content: Thought content normal.        Judgment: Judgment normal.       BP 114/72   Pulse 62   Temp 98 F (36.7 C) (Temporal)   Ht 5\' 8"  (1.727 m)   Wt 185 lb 3.2 oz (84 kg)   BMI 28.16 kg/m      Assessment & Plan:  Safal Halderman comes in today with chief complaint of Follow-up   Diagnosis and orders addressed:  1. Primary hypertension  2. Alcoholic cirrhosis of liver without ascites ([place])  3. Cirrhosis of liver without ascites, unspecified hepatic cirrhosis type ([place])   4. Type 2 diabetes mellitus with other specified complication, with long-term current use of insulin ([place])  5. Depression, major, single episode, mild ([place])  6. History of ETOH abuse  7. Other diabetic neurological complication associated with type 2 diabetes mellitus ([place]) Will increase gabapentin to 400 gm from 300 mg  Labs pending Health Maintenance reviewed Diet and exercise encouraged  Follow up plan: 6 months    Evelina Dun, FNP

## 2020-12-08 ENCOUNTER — Ambulatory Visit: Payer: Medicare Other | Admitting: Pharmacist

## 2020-12-19 ENCOUNTER — Ambulatory Visit (INDEPENDENT_AMBULATORY_CARE_PROVIDER_SITE_OTHER): Payer: Medicare Other | Admitting: Pharmacist

## 2020-12-19 ENCOUNTER — Other Ambulatory Visit: Payer: Self-pay

## 2020-12-19 DIAGNOSIS — Z794 Long term (current) use of insulin: Secondary | ICD-10-CM

## 2020-12-19 DIAGNOSIS — E1169 Type 2 diabetes mellitus with other specified complication: Secondary | ICD-10-CM | POA: Diagnosis not present

## 2020-12-19 MED ORDER — FREESTYLE LIBRE 2 SENSOR MISC: 3 refills | Status: DC

## 2020-12-19 MED ORDER — FREESTYLE LIBRE 14 DAY READER DEVI: 0 refills | Status: DC

## 2020-12-19 MED ORDER — FREESTYLE LIBRE 14 DAY SENSOR MISC: 11 refills | Status: DC

## 2020-12-19 NOTE — Progress Notes (Signed)
    12/19/2020 Name: Thomas Pratt MRN: [id number] DOB: [birth year]   S:  20 yom Presents for diabetes evaluation, education, and management Patient was referred and last seen by Primary Care Provider on 11/27/20. Patient is here to set up [name] coverage/medication affordability: [facility] medicare/medicaid  Patient reports adherence with medications.  Current diabetes medications include: metformin, tresiba, farxiga  Current hypertension medications include: nadolol, spironolactone Goal 130/80 . Current hyperlipidemia medications include: atorvastatin   Patient denies hypoglycemic events.   Patient reported dietary habits: Eats 3 meals/day Discussed meal planning options and Plate method for healthy eating . Avoid sugary drinks and desserts . Incorporate balanced protein, non starchy veggies, 1 serving of carbohydrate with each meal . Increase water intake . Increase physical activity as able  Patient-reported exercise habits: walks, core exercises  Patient denies nocturia (nighttime urination).  Patient reports neuropathy (nerve pain).  Patient reports visual changes.  Patient reports self foot exams.    O:  Lab Results  Component Value Date   HGBA1C 7.1 (H) 10/25/2020    There were no vitals filed for this visit.     Lipid Panel     Component Value Date/Time   CHOL 181 05/20/2019 1452   TRIG 193 (H) 05/20/2019 1452   HDL 45 05/20/2019 1452   CHOLHDL 4.0 05/20/2019 1452   CHOLHDL 3.4 09/26/2017 1101   LDLCALC 103 (H) 05/20/2019 1452   LDLCALC 108 (H) 09/26/2017 1101    Home fasting blood sugars: 120s  2 hour post-meal/random blood sugars: n/a.    Clinical Atherosclerotic Cardiovascular Disease (ASCVD): Yes   The ASCVD Risk score Mikey Bussing DC Jr., et al., 2013) failed to calculate for the following reasons:   The patient has a prior MI or stroke diagnosis    A/P:  Diabetes T2DM currently controlled.  Patient is adherent with  medication.  -LIBRE 2 CGM not covered at local pharmacy  Sent to [employer] portal to determine which vendor covers  Reader and sensors sent since patient's phone does not have app capability  -Continue all medications as prescribed; patient doing well  -Extensively discussed pathophysiology of diabetes, recommended lifestyle interventions, dietary effects on blood sugar control  -Counseled on s/sx of and management of hypoglycemia     Written patient instructions provided.  Total time in face to face counseling 25 minutes.    Regina Eck, PharmD, BCPS Clinical Pharmacist, [place]  II Phone [phone]

## 2021-01-04 ENCOUNTER — Other Ambulatory Visit: Payer: Self-pay | Admitting: Nurse Practitioner

## 2021-01-04 DIAGNOSIS — K766 Portal hypertension: Secondary | ICD-10-CM

## 2021-01-04 DIAGNOSIS — F1011 Alcohol abuse, in remission: Secondary | ICD-10-CM

## 2021-01-04 DIAGNOSIS — K7460 Unspecified cirrhosis of liver: Secondary | ICD-10-CM

## 2021-01-04 DIAGNOSIS — I8510 Secondary esophageal varices without bleeding: Secondary | ICD-10-CM

## 2021-01-17 DIAGNOSIS — E1165 Type 2 diabetes mellitus with hyperglycemia: Secondary | ICD-10-CM | POA: Diagnosis not present

## 2021-01-25 ENCOUNTER — Other Ambulatory Visit: Payer: Self-pay

## 2021-01-25 ENCOUNTER — Ambulatory Visit (INDEPENDENT_AMBULATORY_CARE_PROVIDER_SITE_OTHER): Payer: Medicare Other | Admitting: Pharmacist

## 2021-01-25 DIAGNOSIS — E1169 Type 2 diabetes mellitus with other specified complication: Secondary | ICD-10-CM | POA: Diagnosis not present

## 2021-01-25 DIAGNOSIS — Z794 Long term (current) use of insulin: Secondary | ICD-10-CM

## 2021-01-25 MED ORDER — FLUTICASONE PROPIONATE 50 MCG/ACT NA SUSP: 2.0000 | Freq: Every day | NASAL | 6 refills | Status: DC

## 2021-01-25 NOTE — Progress Notes (Signed)
Chronic Care Management Pharmacy Note  01/26/2021 Name:  Thomas Pratt MRN:  [id number] DOB:  1961-07-14  Summary: diabetes management; libre 2 CGM application  Recommendations/Changes made from today's visit: Diabetes: Uncontrolled; current treatment:TRESIBA, METFORMIN, FARXIGA;  [place] 2 CGM APPLIED TO PATIENT'S RIGHT ARM PATIENT WILL USE THE READER TO SCAN SENSOR (PHONE IS NOT COMPATIBLE) A1C 7.1%, GFR 94 Current glucose readings: fasting glucose: 90-120, post prandial glucose: <190 Denies hypoglycemic/hyperglycemic symptoms Discussed meal planning options and Plate method for healthy eating Avoid sugary drinks and desserts Incorporate balanced protein, non starchy veggies, 1 serving of carbohydrate with each meal Increase water intake Increase physical activity as able Current exercise: N/A Educated on LIBRE, BLOOD SUGARS Counseled on DIABETES MEDICATIONS, LIBRE 2 CGM  Plan: f/u in 1 week  Subjective: Thomas Pratt is an 60 y.o. year old male who is a primary patient of Sharion Balloon, FNP.  The CCM team was consulted for assistance with disease management and care coordination needs.    Engaged with patient face to face for initial visit in response to provider referral for pharmacy case management and/or care coordination services.   Consent to Services:  The patient was given the following information about Chronic Care Management services today, agreed to services, and gave verbal consent: 1. CCM service includes personalized support from designated clinical staff supervised by the primary care provider, including individualized plan of care and coordination with other care providers 2. 24/7 contact phone numbers for assistance for urgent and routine care needs. 3. Service will only be billed when office clinical staff spend 20 minutes or more in a month to coordinate care. 4. Only one practitioner may furnish and bill the service in a  calendar month. 5.The patient may stop CCM services at any time (effective at the end of the month) by phone call to the office staff. 6. The patient will be responsible for cost sharing (co-pay) of up to 20% of the service fee (after annual deductible is met). Patient agreed to services and consent obtained.  Patient Care Team: Sharion Balloon, FNP as PCP - General (Family Medicine) Gala Romney Cristopher Estimable, MD as Consulting Physician (Gastroenterology) Lavera Guise, [facility] (Pharmacist)  Recent office visits: 10/2020  Recent consult visits: Follows with GI  Hospital visits: None in previous 6 months  Objective:  Lab Results  Component Value Date   CREATININE 1.01 10/25/2020   CREATININE 0.99 08/23/2020   CREATININE 0.87 07/13/2020    Lab Results  Component Value Date   HGBA1C 7.1 (H) 10/25/2020   Last diabetic Eye exam:  Lab Results  Component Value Date/Time   HMDIABEYEEXA No Retinopathy 08/14/2020 12:00 AM    Last diabetic Foot exam: No results found for: HMDIABFOOTEX      Component Value Date/Time   CHOL 181 05/20/2019 1452   TRIG 193 (H) 05/20/2019 1452   HDL 45 05/20/2019 1452   CHOLHDL 4.0 05/20/2019 1452   CHOLHDL 3.4 09/26/2017 1101   LDLCALC 103 (H) 05/20/2019 1452   LDLCALC 108 (H) 09/26/2017 1101    Hepatic Function Latest Ref Rng & Units 08/23/2020 07/13/2020 02/16/2020  Total Protein 6.5 - 8.1 g/dL 8.7(H) 7.4 7.8  Albumin 3.5 - 5.0 g/dL 4.5 4.3 4.3  AST 15 - 41 U/L '30 24 30  ' ALT 0 - 44 U/L '29 28 31  ' Alk Phosphatase 38 - 126 U/L 58 75 56  Total Bilirubin 0.3 - 1.2 mg/dL 1.0 0.4 0.8  Bilirubin, Direct 0.00 -  0.40 mg/dL - 0.14 -    Lab Results  Component Value Date/Time   TSH 1.690 05/20/2019 02:52 PM   TSH 0.901 11/08/2018 02:01 PM   TSH 1.640 05/18/2018 10:01 AM    CBC Latest Ref Rng & Units 08/23/2020 02/16/2020 01/10/2020  WBC 4.0 - 10.5 K/uL 7.7 6.4 7.7  Hemoglobin 13.0 - 17.0 g/dL 15.4 14.0 14.4  Hematocrit 39.0 - 52.0 % 46.9 42.6 42.5  Platelets  150 - 400 K/uL 243 172 236    Lab Results  Component Value Date/Time   VD25OH 33.9 05/18/2018 10:01 AM   VD25OH 13 (L) 09/26/2017 11:01 AM    Clinical ASCVD: Yes  The ASCVD Risk score Mikey Bussing DC Jr., et al., 2013) failed to calculate for the following reasons:   The patient has a prior MI or stroke diagnosis    Other: (CHADS2VASc if Afib, PHQ9 if depression, MMRC or CAT for COPD, ACT, DEXA)  Social History   Tobacco Use  Smoking Status Never  Smokeless Tobacco Never   BP Readings from Last 3 Encounters:  11/27/20 114/72  10/25/20 109/69  08/17/20 138/85   Pulse Readings from Last 3 Encounters:  11/27/20 62  10/25/20 61  08/17/20 81   Wt Readings from Last 3 Encounters:  11/27/20 185 lb 3.2 oz (84 kg)  11/07/20 180 lb (81.6 kg)  10/25/20 183 lb (83 kg)    Assessment: Review of patient past medical history, allergies, medications, health status, including review of consultants reports, laboratory and other test data, was performed as part of comprehensive evaluation and provision of chronic care management services.   SDOH:  (Social Determinants of Health) assessments and interventions performed:    CCM Care Plan  No Known Allergies  Medications Reviewed Today     Reviewed by Lavera Guise, [facility] (Pharmacist) on 01/25/21 at 1455  Med List Status: <None>   Medication Order Taking? Sig Documenting Provider Last Dose Status Informant  Accu-Chek Softclix Lancets lancets [id number] No Test BS 4 (four) times daily. Dx E11.69  Patient taking differently: 1 each by Other route 4 (four) times daily as needed. Test BS 4 (four) times daily. Dx E11.69   Sharion Balloon, FNP Taking Active Self  atorvastatin (LIPITOR) 10 MG tablet [id number] No TAKE 1 TABLET BY MOUTH  DAILY Sharion Balloon, FNP Taking Active   blood glucose meter kit and supplies [id number] No Dispense based on patient and insurance preference. Use up to four times daily as directed. (FOR ICD-10 E10.9, E11.9).   Patient taking differently: 1 each by Other route once. Dispense based on patient and insurance preference. Use up to four times daily as directed. (FOR ICD-10 E10.9, E11.9).   Sharion Balloon, FNP Taking Active Self  Blood Glucose Monitoring Suppl (ACCU-CHEK GUIDE) w/Device KIT [id number] No Test BS 4 (four) times daily. Dx E11.69 Sharion Balloon, FNP Taking Active Self  cetirizine (ZYRTEC) 10 MG tablet [id number] No Take 1 tablet (10 mg total) by mouth daily. Sharion Balloon, FNP Taking Active   Continuous Blood Gluc Sensor (FREESTYLE LIBRE 2 SENSOR) [place] [id number]  Use to test blood sugars 6x daily as directed Evelina Dun A, FNP  Active   dapagliflozin propanediol (FARXIGA) 10 MG TABS tablet [id number] No Take 1 tablet (10 mg total) by mouth daily before breakfast. Sharion Balloon, FNP Taking Active   dexlansoprazole (DEXILANT) 60 MG capsule [id number] No Take 1 capsule (60 mg total) by mouth daily. Sharion Balloon, FNP Taking Active  escitalopram (LEXAPRO) 10 MG tablet [id number] No Take 1 tablet (10 mg total) by mouth daily. Sharion Balloon, FNP Taking Active   fluticasone (FLONASE) 50 MCG/ACT nasal spray [id number] No Place 2 sprays into both nostrils daily. Sharion Balloon, FNP Taking Active   furosemide (LASIX) 20 MG tablet [id number] No Take 1 tablet (20 mg total) by mouth daily. Carlis Stable, NP Taking Active   gabapentin (NEURONTIN) 400 MG capsule [id number]  Take 1 capsule (400 mg total) by mouth 2 (two) times daily. Evelina Dun A, FNP  Active   glucose blood (ACCU-CHEK GUIDE) test strip [id number] No Test BS 4 (four) times daily. Dx E11.69  Patient taking differently: 1 each by Other route 4 (four) times daily as needed. Test BS 4 (four) times daily. Dx E11.69   Sharion Balloon, FNP Taking Active Self  insulin degludec (TRESIBA FLEXTOUCH) 100 UNIT/ML FlexTouch Pen [id number] No Inject 25 Units into the skin at bedtime. Sharion Balloon, FNP Taking Active   Insulin Pen Needle (B-D  UF III MINI PEN NEEDLES) 31G X 5 MM MISC [id number] No USE WITH TRESIBA daily Dx E11.69  Patient taking differently: Inject 1 each into the skin daily. USE WITH TRESIBA daily Dx E11.69   Sharion Balloon, FNP Taking Active Self  metFORMIN (GLUCOPHAGE-XR) 750 MG 24 hr tablet [id number] No TAKE 2 TABLETS BY MOUTH ONCE DAILY WITH BREAKFAST Hawks, Christy A, FNP Taking Active   nadolol (CORGARD) 40 MG tablet [id number]  Take 1 tablet by mouth once daily Annitta Needs, NP  Active   ondansetron (ZOFRAN) 4 MG tablet [id number] No Take 1 tablet (4 mg total) by mouth every 6 (six) hours as needed for nausea. Roxan Hockey, MD Taking Active   rifaximin (XIFAXAN) 550 MG TABS tablet [id number] No Take 1 tablet (550 mg total) by mouth 2 (two) times daily. Carlis Stable, NP Taking Active   spironolactone (ALDACTONE) 50 MG tablet [id number] No Take 1 tablet by mouth once daily Sharion Balloon, FNP Taking Active   VITAMIN D PO [id number] No Take 1 capsule by mouth daily.  [provider] Taking Active Self            Patient Active Problem List   Diagnosis Date Noted   Diabetes mellitus ([place]) 10/25/2020   Esophageal varices ([place]) 08/17/2020   Portal hypertension ([place]) 08/17/2020   Gastroesophageal reflux disease 07/13/2020   Acute GI bleeding 12/23/2019   GI bleed 12/22/2019   Depression, major, single episode, mild ([place]) 05/20/2019   Dysphagia 03/01/2019   CVA (cerebral vascular accident) ([place]) 11/08/2018   History of ETOH abuse 02/25/2018   Primary biliary cholangitis ([place]) 11/05/2017   Hyperlipidemia associated with type 2 diabetes mellitus ([place]) 10/30/2017   Abnormal LFTs 09/16/2017   Calculus of gallbladder without cholecystitis without obstruction    Inguinal hernia 11/14/2016   Cirrhosis ([place]) 05/21/2016   Hypoalbuminemia 05/21/2016   Hyponatremia 05/21/2016   Hypokalemia 05/21/2016   HTN (hypertension) 14/43/1540   ALC (alcoholic liver cirrhosis) ([place]) 05/21/2016    Immunization  History  Administered Date(s) Administered   Influenza,inj,Quad PF,6+ Mos 04/03/2018, 05/20/2019, 04/12/2020   Moderna Sars-Covid-2 Vaccination 11/17/2019, 12/15/2019   Pneumococcal Conjugate-13 09/26/2017   Pneumococcal Polysaccharide-23 03/03/2018   Tdap 09/26/2017   Zoster Recombinat (Shingrix) 11/27/2020    Conditions to be addressed/monitored: HTN, HLD, and DMII  Care Plan : PHARMD MEDICATION MANAGEMENT  Updates made by Lavera Guise, [place] since 01/26/2021 12:00 AM     Problem:  DISEASE PROGRESSION PREVENTION      Long-Range Goal: T2DM   This Visit's Progress: On track  Priority: High  Note:   Current Barriers:  Unable to independently monitor therapeutic efficacy  Pharmacist Clinical Goal(s):  Over the next 90 days, patient will achieve adherence to monitoring guidelines and medication adherence to achieve therapeutic efficacy maintain control of T2DM as evidenced by GOAL A1C<7%  through collaboration with PharmD and provider.    Interventions: 1:1 collaboration with Sharion Balloon, FNP regarding development and update of comprehensive plan of care as evidenced by provider attestation and co-signature Inter-disciplinary care team collaboration (see longitudinal plan of care) Comprehensive medication review performed; medication list updated in electronic medical record  Diabetes: Uncontrolled; current treatment:TRESIBA, METFORMIN, FARXIGA;  [place] 2 CGM APPLIED TO PATIENT'S RIGHT ARM PATIENT WILL USE THE READER TO SCAN SENSOR (PHONE IS NOT COMPATIBLE) A1C 7.1%, GFR 94 Current glucose readings: fasting glucose: 90-120, post prandial glucose: <190 Denies hypoglycemic/hyperglycemic symptoms Discussed meal planning options and Plate method for healthy eating Avoid sugary drinks and desserts Incorporate balanced protein, non starchy veggies, 1 serving of carbohydrate with each meal Increase water intake Increase physical activity as  able Current exercise: N/A Educated on [place] on [place], LIBRE 2 CGM   Patient Goals/Self-Care Activities Over the next 90 days, patient will:  - take medications as prescribed check glucose CONTINUOUSLY USING LIBRE CGM, document, and provide at future appointments  Follow Up Plan: Telephone follow up appointment with care management team member scheduled for: 1 MONTH      Medication Assistance: None required.  Patient affirms current coverage meets needs.  Patient's preferred pharmacy is:  [facility] [address], [address] [address] [place] [address] Phone: [phone] Fax: [phone]  [facility]  ([place] - [place], [place] [address] Phone: [phone] Fax: [phone]  [id number] [place] [id number]: [phone]: [phone] Uses pill box? No - n/a Pt endorses 100% compliance  Follow Up:  Patient agrees to Care Plan and Follow-up.  Plan: Telephone follow up appointment with care management team member scheduled for:  1 month  JRegina Eck PharmD, BCPS Clinical Pharmacist, [place] Phone [phone]

## 2021-01-26 NOTE — Patient Instructions (Signed)
Visit Information  PATIENT GOALS:  Goals Addressed               This Visit's Progress     Patient Stated     T2DM (pt-stated)        Current Barriers:  Unable to independently monitor therapeutic efficacy  Pharmacist Clinical Goal(s):  Over the next 90 days, patient will achieve adherence to monitoring guidelines and medication adherence to achieve therapeutic efficacy maintain control of T2DM as evidenced by GOAL A1C<7%  through collaboration with PharmD and provider.    Interventions: 1:1 collaboration with Sharion Balloon, FNP regarding development and update of comprehensive plan of care as evidenced by provider attestation and co-signature Inter-disciplinary care team collaboration (see longitudinal plan of care) Comprehensive medication review performed; medication list updated in electronic medical record  Diabetes: Uncontrolled; current treatment:TRESIBA, METFORMIN, FARXIGA;  [place] 2 CGM APPLIED TO PATIENT'S RIGHT ARM PATIENT WILL USE THE READER TO SCAN SENSOR (PHONE IS NOT COMPATIBLE) A1C 7.1%, GFR 94 Current glucose readings: fasting glucose: 90-120, post prandial glucose: <190 Denies hypoglycemic/hyperglycemic symptoms Discussed meal planning options and Plate method for healthy eating Avoid sugary drinks and desserts Incorporate balanced protein, non starchy veggies, 1 serving of carbohydrate with each meal Increase water intake Increase physical activity as able Current exercise: N/A Educated on [place] on [place], LIBRE 2 CGM   Patient Goals/Self-Care Activities Over the next 90 days, patient will:  - take medications as prescribed check glucose CONTINUOUSLY USING LIBRE CGM, document, and provide at future appointments  Follow Up Plan: Telephone follow up appointment with care management team member scheduled for: 1 MONTH          The patient verbalized understanding of instructions,  educational materials, and care plan provided today and declined offer to receive copy of patient instructions, educational materials, and care plan.   Telephone follow up appointment with care management team member scheduled for: 1 month  Signature Regina Eck, PharmD, BCPS Clinical Pharmacist, [place] Phone [phone]

## 2021-01-30 ENCOUNTER — Other Ambulatory Visit: Payer: Self-pay | Admitting: Family

## 2021-01-30 DIAGNOSIS — E1169 Type 2 diabetes mellitus with other specified complication: Secondary | ICD-10-CM

## 2021-01-31 DIAGNOSIS — D3131 Benign neoplasm of right choroid: Secondary | ICD-10-CM | POA: Diagnosis not present

## 2021-01-31 DIAGNOSIS — H35363 Drusen (degenerative) of macula, bilateral: Secondary | ICD-10-CM | POA: Diagnosis not present

## 2021-01-31 DIAGNOSIS — H2513 Age-related nuclear cataract, bilateral: Secondary | ICD-10-CM | POA: Diagnosis not present

## 2021-01-31 DIAGNOSIS — E119 Type 2 diabetes mellitus without complications: Secondary | ICD-10-CM | POA: Diagnosis not present

## 2021-01-31 DIAGNOSIS — D3132 Benign neoplasm of left choroid: Secondary | ICD-10-CM | POA: Diagnosis not present

## 2021-01-31 LAB — HM DIABETES EYE EXAM

## 2021-02-05 ENCOUNTER — Other Ambulatory Visit: Payer: Self-pay | Admitting: Family

## 2021-02-05 DIAGNOSIS — E1169 Type 2 diabetes mellitus with other specified complication: Secondary | ICD-10-CM

## 2021-02-07 ENCOUNTER — Other Ambulatory Visit: Payer: Self-pay | Admitting: Family

## 2021-02-07 DIAGNOSIS — J301 Allergic rhinitis due to pollen: Secondary | ICD-10-CM

## 2021-02-08 ENCOUNTER — Other Ambulatory Visit: Payer: Self-pay | Admitting: Family

## 2021-02-08 DIAGNOSIS — E785 Hyperlipidemia, unspecified: Secondary | ICD-10-CM

## 2021-02-08 DIAGNOSIS — E1169 Type 2 diabetes mellitus with other specified complication: Secondary | ICD-10-CM

## 2021-02-14 ENCOUNTER — Encounter: Payer: Self-pay | Admitting: *Deleted

## 2021-02-14 ENCOUNTER — Ambulatory Visit (INDEPENDENT_AMBULATORY_CARE_PROVIDER_SITE_OTHER): Payer: Medicare Other | Admitting: Gastroenterology

## 2021-02-14 ENCOUNTER — Encounter: Payer: Self-pay | Admitting: Gastroenterology

## 2021-02-14 ENCOUNTER — Other Ambulatory Visit: Payer: Self-pay

## 2021-02-14 VITALS — BP 146/83 | HR 66 | Temp 97.7°F | Ht 68.0 in | Wt 183.4 lb

## 2021-02-14 DIAGNOSIS — R1011 Right upper quadrant pain: Secondary | ICD-10-CM | POA: Diagnosis not present

## 2021-02-14 DIAGNOSIS — K219 Gastro-esophageal reflux disease without esophagitis: Secondary | ICD-10-CM | POA: Diagnosis not present

## 2021-02-14 DIAGNOSIS — K7460 Unspecified cirrhosis of liver: Secondary | ICD-10-CM

## 2021-02-14 NOTE — Progress Notes (Signed)
Primary Care Physician: Sharion Balloon, FNP  Primary Gastroenterologist:  Garfield Cornea, MD   Chief Complaint  Patient presents with   Cirrhosis   Abdominal Pain    Right side, thinks may be from gallbladder removal    HPI: Thomas Pratt is a 60 y.o. male here for follow-up of cirrhosis.  Patient last seen in January 2022.  H/O cirrhosis presumably due to prior etoh abuse (none since 03/2016) however, he has positive AMA, ANA, ASMA (on repeat neg), elevated IgG, liver bx with granulomatous hepatitis. Evaluated by [facility] liver transplant center. Labs repeated and they reviewed liver biopsy slides and suspected cirrhosis secondary etoh use but did have hepatic sarcoidosis. Pulmonary work up negative for lung involvement. URSO was stopped by [facility] liver. Advised to complete six months of substance abuse counseling and return if liver disease decompensates.   Colonoscopy up-to-date 2018.  Meld score chronically around 7-8, child Pugh class A.  He did have a hospitalization in June 2021 for acute GI bleed with soft blood pressures, bloody stools, hemoglobin above 12.  CT of the abdomen and pelvis was normal.  He was treated with octreotide and IV Protonix bolus with an EGD updated 12/23/2019 with a innocent appearing and barely grade 2 esophageal varices, mild portal hypertensive gastropathy, no evidence of bleeding from upper GI tract and recommend he continue his current medication.  Overall feels well. States he has started substance abuse classes one month ago. He complains of intermittent ruq pain, chronic in nature. Lasts 20 minutes at a time. Sometimes sharp and related to movement. Not related to meals. BM regular. Several BMs per day. No melena, brbpr. No heartburn, vomiting. Appetite is good. No etoh since 03/2016.   Current Outpatient Medications  Medication Sig Dispense Refill   Accu-Chek Softclix Lancets lancets Test BS 4 (four) times daily. Dx E11.69 (Patient taking  differently: 1 each by Other route 4 (four) times daily as needed. Test BS 4 (four) times daily. Dx E11.69) 400 each 3   atorvastatin (LIPITOR) 10 MG tablet TAKE 1 TABLET BY MOUTH  DAILY 90 tablet 3   blood glucose meter kit and supplies Dispense based on patient and insurance preference. Use up to four times daily as directed. (FOR ICD-10 E10.9, E11.9). (Patient taking differently: 1 each by Other route once. Dispense based on patient and insurance preference. Use up to four times daily as directed. (FOR ICD-10 E10.9, E11.9).) 1 each 0   Blood Glucose Monitoring Suppl (ACCU-CHEK GUIDE) w/Device KIT Test BS 4 (four) times daily. Dx E11.69 1 kit 0   cetirizine (ZYRTEC) 10 MG tablet Take 1 tablet by mouth once daily 30 tablet 5   Continuous Blood Gluc Sensor (FREESTYLE LIBRE 2 SENSOR) MISC Use to test blood sugars 6x daily as directed 2 each 3   dapagliflozin propanediol (FARXIGA) 10 MG TABS tablet Take 1 tablet (10 mg total) by mouth daily before breakfast. 90 tablet 3   dexlansoprazole (DEXILANT) 60 MG capsule Take 1 capsule (60 mg total) by mouth daily. 90 capsule 3   escitalopram (LEXAPRO) 10 MG tablet Take 1 tablet (10 mg total) by mouth daily. 90 tablet 0   fluticasone (FLONASE) 50 MCG/ACT nasal spray Place 2 sprays into both nostrils daily. 16 g 6   furosemide (LASIX) 20 MG tablet Take 1 tablet (20 mg total) by mouth daily. 90 tablet 3   gabapentin (NEURONTIN) 400 MG capsule Take 1 capsule (400 mg total) by mouth 2 (two) times daily.  180 capsule 2   glucose blood (ACCU-CHEK GUIDE) test strip Test BS 4 (four) times daily. Dx E11.69 (Patient taking differently: 1 each by Other route 4 (four) times daily as needed. Test BS 4 (four) times daily. Dx E11.69) 400 each 3   insulin degludec (TRESIBA FLEXTOUCH) 100 UNIT/ML FlexTouch Pen Inject 25 Units into the skin at bedtime. 15 mL 3   Insulin Pen Needle (B-D UF III MINI PEN NEEDLES) 31G X 5 MM MISC USE WITH TRESIBA DAILY 100 each 3   metFORMIN  (GLUCOPHAGE-XR) 750 MG 24 hr tablet TAKE 2 TABLETS BY MOUTH ONCE DAILY WITH BREAKFAST 180 tablet 0   nadolol (CORGARD) 40 MG tablet Take 1 tablet by mouth once daily 30 tablet 5   ondansetron (ZOFRAN) 4 MG tablet Take 1 tablet (4 mg total) by mouth every 6 (six) hours as needed for nausea. 20 tablet 0   rifaximin (XIFAXAN) 550 MG TABS tablet Take 1 tablet (550 mg total) by mouth 2 (two) times daily. 180 tablet 3   spironolactone (ALDACTONE) 50 MG tablet Take 1 tablet by mouth once daily 90 tablet 0   VITAMIN D PO Take 1 capsule by mouth daily.      No current facility-administered medications for this visit.    Allergies as of 02/14/2021   (No Known Allergies)    ROS:  General: Negative for anorexia, weight loss, fever, chills, fatigue, weakness. ENT: Negative for hoarseness, difficulty swallowing , nasal congestion. CV: Negative for chest pain, angina, palpitations, dyspnea on exertion, peripheral edema.  Respiratory: Negative for dyspnea at rest, dyspnea on exertion, cough, sputum, wheezing.  GI: See history of present illness. GU:  Negative for dysuria, hematuria, urinary incontinence, urinary frequency, nocturnal urination.  Endo: Negative for unusual weight change.    Physical Examination:   BP (!) 146/83   Pulse 66   Temp 97.7 F (36.5 C) (Temporal)   Ht '5\' 8"'  (1.727 m)   Wt 183 lb 6.4 oz (83.2 kg)   BMI 27.89 kg/m   General: Well-nourished, well-developed in no acute distress.  Eyes: No icterus. Mouth: masked Lungs: Clear to auscultation bilaterally.  Heart: Regular rate and rhythm, no murmurs rubs or gallops.  Abdomen: Bowel sounds are normal, nondistended, no hepatosplenomegaly or masses, no abdominal bruits or hernia , no rebound or guarding.  Mild tenderness ruq with deep palpation. Extremities: No lower extremity edema. No clubbing or deformities. Neuro: Alert and oriented x 4   Skin: Warm and dry, no jaundice.   Psych: Alert and cooperative, normal mood and  affect.  Labs:  Lab Results  Component Value Date   CREATININE 1.01 10/25/2020   BUN 15 10/25/2020   NA 143 10/25/2020   K 4.9 10/25/2020   CL 101 10/25/2020   CO2 25 10/25/2020   Lab Results  Component Value Date   ALT 29 08/23/2020   AST 30 08/23/2020   ALKPHOS 58 08/23/2020   BILITOT 1.0 08/23/2020   Lab Results  Component Value Date   WBC 7.7 08/23/2020   HGB 15.4 08/23/2020   HCT 46.9 08/23/2020   MCV 91.4 08/23/2020   PLT 243 08/23/2020   Lab Results  Component Value Date   INR 1.0 08/23/2020   INR 1.1 02/16/2020   INR 1.1 12/22/2019     Imaging Studies: No results found.   Assessment:  Pleasant 60 y/o male with history of alcoholic cirrhosis, granulomatous hepatitis (on liver bx), positive ANA/AMA/IgG complicated by portal HTN, grade 2 esophageal varices. He  continues to abstain from etoh (quit 03/2016). He is on nadolol 29m qd, Xifaxan 5539mbid, Lasix 2024md, aldactione 59m35m.   He complains of intermittent RUQ pain, not meal related. Chronic in nature. Extensively evaluated years back and ultimately had cholecystectomy 08/2017. EGD 12/2019. Continue Dexilant. Also with umbilical hernia discomfort at times but states general surgery does not advise surgery at this time. Will update labs and abdominal u/s.    Plan: Will verify he complete Hep B vaccinations as well as Pneumococcal and Shingrix vaccines.  Consider updating vitamin D level and if low, then DEXA per [facility].  Continue substance abuse counseling.  Update labs and abdominal u/s.  Return ov in six months.

## 2021-02-14 NOTE — Patient Instructions (Addendum)
Continue Dexilant 60 mg daily Continue nadolol 40 mg daily Continue Xifaxan 500 mg twice daily Continue Aldactone 50 mg daily Continue furosemide 20 mg daily Complete ultrasound and labs.  We will be in touch with results as available. Continue with classes for history of alcohol abuse Return to the office in 6 months or call sooner if needed.

## 2021-02-15 ENCOUNTER — Ambulatory Visit: Payer: Medicare Other | Admitting: Nurse Practitioner

## 2021-02-16 ENCOUNTER — Telehealth: Payer: Self-pay | Admitting: Gastroenterology

## 2021-02-16 NOTE — Telephone Encounter (Signed)
Can we find out if patient ever had Hep B vaccinations? Can we also add Hep B surface Antibody and vitamin D 25 to his future labs?

## 2021-02-17 DIAGNOSIS — E1165 Type 2 diabetes mellitus with hyperglycemia: Secondary | ICD-10-CM | POA: Diagnosis not present

## 2021-02-19 ENCOUNTER — Other Ambulatory Visit (HOSPITAL_COMMUNITY)
Admission: RE | Admit: 2021-02-19 | Discharge: 2021-02-19 | Disposition: A | Discharge status: Home / Self Care | Payer: Medicare HMO | Source: Ambulatory Visit | Attending: Gastroenterology | Admitting: Gastroenterology

## 2021-02-19 ENCOUNTER — Ambulatory Visit (HOSPITAL_COMMUNITY)
Admission: RE | Admit: 2021-02-19 | Discharge: 2021-02-19 | Disposition: A | Discharge status: Home / Self Care | Payer: Medicare HMO | Source: Ambulatory Visit | Attending: Gastroenterology | Admitting: Gastroenterology

## 2021-02-19 ENCOUNTER — Other Ambulatory Visit: Payer: Self-pay

## 2021-02-19 DIAGNOSIS — R1011 Right upper quadrant pain: Secondary | ICD-10-CM | POA: Insufficient documentation

## 2021-02-19 DIAGNOSIS — K7460 Unspecified cirrhosis of liver: Secondary | ICD-10-CM | POA: Insufficient documentation

## 2021-02-19 DIAGNOSIS — K219 Gastro-esophageal reflux disease without esophagitis: Secondary | ICD-10-CM | POA: Diagnosis not present

## 2021-02-19 DIAGNOSIS — N219 Calculus of lower urinary tract, unspecified: Secondary | ICD-10-CM | POA: Insufficient documentation

## 2021-02-19 DIAGNOSIS — K7689 Other specified diseases of liver: Secondary | ICD-10-CM | POA: Diagnosis not present

## 2021-02-19 DIAGNOSIS — Z9049 Acquired absence of other specified parts of digestive tract: Secondary | ICD-10-CM | POA: Diagnosis not present

## 2021-02-19 LAB — CBC WITH DIFFERENTIAL/PLATELET
Abs Immature Granulocytes: 0.01 10*3/uL (ref 0.00–0.07)
Basophils Absolute: 0 10*3/uL (ref 0.0–0.1)
Basophils Relative: 1 %
Eosinophils Absolute: 0.2 10*3/uL (ref 0.0–0.5)
Eosinophils Relative: 3 %
HCT: 48.4 % (ref 39.0–52.0)
Hemoglobin: 15.9 g/dL (ref 13.0–17.0)
Immature Granulocytes: 0 %
Lymphocytes Relative: 35 %
Lymphs Abs: 2.1 10*3/uL (ref 0.7–4.0)
MCH: 30.1 pg (ref 26.0–34.0)
MCHC: 32.9 g/dL (ref 30.0–36.0)
MCV: 91.7 fL (ref 80.0–100.0)
Monocytes Absolute: 0.5 10*3/uL (ref 0.1–1.0)
Monocytes Relative: 8 %
Neutro Abs: 3.1 10*3/uL (ref 1.7–7.7)
Neutrophils Relative %: 53 %
Platelets: 153 10*3/uL (ref 150–400)
RBC: 5.28 MIL/uL (ref 4.22–5.81)
RDW: 13.4 % (ref 11.5–15.5)
WBC: 5.9 10*3/uL (ref 4.0–10.5)
nRBC: 0 % (ref 0.0–0.2)

## 2021-02-19 LAB — COMPREHENSIVE METABOLIC PANEL
ALT: 28 U/L (ref 0–44)
AST: 31 U/L (ref 15–41)
Albumin: 4.6 g/dL (ref 3.5–5.0)
Alkaline Phosphatase: 57 U/L (ref 38–126)
Anion gap: 8 (ref 5–15)
BUN: 15 mg/dL (ref 8–23)
CO2: 31 mmol/L (ref 22–32)
Calcium: 9.8 mg/dL (ref 8.9–10.3)
Chloride: 102 mmol/L (ref 98–111)
Creatinine, Ser: 0.95 mg/dL (ref 0.61–1.24)
GFR, Estimated: >60 mL/min (ref >60)
Glucose, Bld: 120 mg/dL — ABNORMAL HIGH (ref 70–99)
Potassium: 4.5 mmol/L (ref 3.5–5.1)
Sodium: 141 mmol/L (ref 135–145)
Total Bilirubin: 0.7 mg/dL (ref 0.3–1.2)
Total Protein: 8.3 g/dL — ABNORMAL HIGH (ref 6.5–8.1)

## 2021-02-19 LAB — PROTIME-INR
INR: 1 (ref 0.8–1.2)
Prothrombin Time: 13.6 seconds (ref 11.4–15.2)

## 2021-02-19 NOTE — Telephone Encounter (Signed)
Lmom for pt to call office back. 

## 2021-02-20 ENCOUNTER — Encounter: Payer: Self-pay | Admitting: *Deleted

## 2021-02-20 ENCOUNTER — Other Ambulatory Visit: Payer: Self-pay | Admitting: *Deleted

## 2021-02-20 DIAGNOSIS — K7460 Unspecified cirrhosis of liver: Secondary | ICD-10-CM

## 2021-02-20 LAB — AFP TUMOR MARKER: AFP, Serum, Tumor Marker: 1.8 ng/mL (ref 0.0–8.4)

## 2021-02-20 NOTE — Telephone Encounter (Signed)
Spoke to [facility] lab tech and she seen where the labs were added.

## 2021-02-20 NOTE — Telephone Encounter (Signed)
Letter sent to pt

## 2021-02-20 NOTE — Telephone Encounter (Signed)
Please make sure we call the lab and let them know we added the two labs. Thanks.

## 2021-02-20 NOTE — Telephone Encounter (Signed)
Spoke to pt. Asked about the Hep B vaccines. He stated he was unsure he thinks he may have. Informed him that more labs were required. Pt voiced understanding. He has labs drawn at [facility]. Labs entered into Epic.

## 2021-02-20 NOTE — Telephone Encounter (Signed)
Lmom for pt to call office back. 

## 2021-02-21 ENCOUNTER — Ambulatory Visit (INDEPENDENT_AMBULATORY_CARE_PROVIDER_SITE_OTHER): Payer: Medicare HMO | Admitting: Pharmacist

## 2021-02-21 DIAGNOSIS — Z794 Long term (current) use of insulin: Secondary | ICD-10-CM

## 2021-02-21 DIAGNOSIS — E1169 Type 2 diabetes mellitus with other specified complication: Secondary | ICD-10-CM

## 2021-02-21 NOTE — Patient Instructions (Signed)
Visit Information  PATIENT GOALS:  Goals Addressed               This Visit's Progress     Patient Stated     T2DM (pt-stated)        Current Barriers:  Unable to independently monitor therapeutic efficacy  Pharmacist Clinical Goal(s):  Over the next 90 days, patient will achieve adherence to monitoring guidelines and medication adherence to achieve therapeutic efficacy maintain control of T2DM as evidenced by GOAL A1C<7%  through collaboration with PharmD and provider.    Interventions: 1:1 collaboration with Sharion Balloon, FNP regarding development and update of comprehensive plan of care as evidenced by provider attestation and co-signature Inter-disciplinary care team collaboration (see longitudinal plan of care) Comprehensive medication review performed; medication list updated in electronic medical record  Diabetes: Uncontrolled (much improved); current treatment:TRESIBA 25 units, METFORMIN, FARXIGA;  [place] 2 CGM--patient doing well on libre system PATIENT WILL USE THE READER TO SCAN SENSOR (PHONE IS NOT COMPATIBLE) A1C 7.1%, GFR 94 Current glucose readings: fasting glucose: 90-120, post prandial glucose: <190 (a few have been >200, but based on diet) Denies hypoglycemic/hyperglycemic symptoms Discussed meal planning options and Plate method for healthy eating Avoid sugary drinks and desserts Incorporate balanced protein, non starchy veggies, 1 serving of carbohydrate with each meal Increase water intake Increase physical activity as able Current exercise: N/A Educated on [place] on [place], LIBRE 2 CGM   Patient Goals/Self-Care Activities Over the next 90 days, patient will:  - take medications as prescribed check glucose CONTINUOUSLY USING LIBRE CGM, document, and provide at future appointments  Follow Up Plan: Telephone follow up appointment with care management team member scheduled for:  MONTH          The patient verbalized understanding of instructions, educational materials, and care plan provided today and declined offer to receive copy of patient instructions, educational materials, and care plan.   Telephone follow up appointment with care management team member scheduled for: 2 months  Regina Eck, PharmD, BCPS Clinical Pharmacist, [place]  II Phone [phone]

## 2021-02-21 NOTE — Progress Notes (Signed)
Chronic Care Management Pharmacy Note  02/21/2021 Name:  Thomas Pratt MRN:  [id number] DOB:  August 30, 1960  Summary: diabetes management  Recommendations/Changes made from today's visit: Diabetes: Uncontrolled (much improved); current treatment:TRESIBA 25 units, METFORMIN, FARXIGA;  [place] 2 CGM--patient doing well on libre system PATIENT WILL USE THE READER TO SCAN SENSOR (PHONE IS NOT COMPATIBLE) A1C 7.1%, GFR 94 Current glucose readings: fasting glucose: 90-120, post prandial glucose: <190 (a few have been >200, but based on diet) Denies hypoglycemic/hyperglycemic symptoms Discussed meal planning options and Plate method for healthy eating Avoid sugary drinks and desserts Incorporate balanced protein, non starchy veggies, 1 serving of carbohydrate with each meal Increase water intake Increase physical activity as able Current exercise: N/A Educated on LIBRE, BLOOD SUGARS Counseled on DIABETES MEDICATIONS, LIBRE 2 CGM Follow Up Plan: Telephone follow up appointment with care management team member scheduled for:  MONTH    Subjective: Thomas Pratt is an 60 y.o. year old male who is a primary patient of Sharion Balloon, FNP.  The CCM team was consulted for assistance with disease management and care coordination needs.    Engaged with patient by telephone for follow up visit in response to provider referral for pharmacy case management and/or care coordination services.   Consent to Services:  The patient was given information about Chronic Care Management services, agreed to services, and gave verbal consent prior to initiation of services.  Please see initial visit note for detailed documentation.   Patient Care Team: Sharion Balloon, FNP as PCP - General (Family Medicine) Gala Romney, Cristopher Estimable, MD as Consulting Physician (Gastroenterology) Lavera Guise, [facility] (Pharmacist)  Recent office visits: GI on 02/19/21   Objective:  Lab Results   Component Value Date   CREATININE 0.95 02/19/2021   CREATININE 1.01 10/25/2020   CREATININE 0.99 08/23/2020    Lab Results  Component Value Date   HGBA1C 7.1 (H) 10/25/2020   Last diabetic Eye exam:  Lab Results  Component Value Date/Time   HMDIABEYEEXA No Retinopathy 08/14/2020 12:00 AM    Last diabetic Foot exam: No results found for: HMDIABFOOTEX      Component Value Date/Time   CHOL 181 05/20/2019 1452   TRIG 193 (H) 05/20/2019 1452   HDL 45 05/20/2019 1452   CHOLHDL 4.0 05/20/2019 1452   CHOLHDL 3.4 09/26/2017 1101   LDLCALC 103 (H) 05/20/2019 1452   LDLCALC 108 (H) 09/26/2017 1101    Hepatic Function Latest Ref Rng & Units 02/19/2021 08/23/2020 07/13/2020  Total Protein 6.5 - 8.1 g/dL 8.3(H) 8.7(H) 7.4  Albumin 3.5 - 5.0 g/dL 4.6 4.5 4.3  AST 15 - 41 U/L _0 ALT 0 - 44 U/L _1 Alk Phosphatase 38 - 126 U/L 57 58 75  Total Bilirubin 0.3 - 1.2 mg/dL 0.7 1.0 0.4  Bilirubin, Direct 0.00 - 0.40 mg/dL - - 0.14    Lab Results  Component Value Date/Time   TSH 1.690 05/20/2019 02:52 PM   TSH 0.901 11/08/2018 02:01 PM   TSH 1.640 05/18/2018 10:01 AM    CBC Latest Ref Rng & Units 02/19/2021 08/23/2020 02/16/2020  WBC 4.0 - 10.5 K/uL 5.9 7.7 6.4  Hemoglobin 13.0 - 17.0 g/dL 15.9 15.4 14.0  Hematocrit 39.0 - 52.0 % 48.4 46.9 42.6  Platelets 150 - 400 K/uL 153 243 172    Lab Results  Component Value Date/Time   VD25OH 33.9 05/18/2018 10:01 AM   VD25OH 13 (L) 09/26/2017 11:01 AM  Clinical ASCVD: Yes  The ASCVD Risk score Mikey Bussing DC Jr., et al., 2013) failed to calculate for the following reasons:   The patient has a prior MI or stroke diagnosis    Other: (CHADS2VASc if Afib, PHQ9 if depression, MMRC or CAT for COPD, ACT, DEXA)  Social History   Tobacco Use  Smoking Status Never  Smokeless Tobacco Never   BP Readings from Last 3 Encounters:  02/14/21 (!) 146/83  11/27/20 114/72  10/25/20 109/69   Pulse Readings from Last 3 Encounters:  02/14/21 66   11/27/20 62  10/25/20 61   Wt Readings from Last 3 Encounters:  02/14/21 183 lb 6.4 oz (83.2 kg)  11/27/20 185 lb 3.2 oz (84 kg)  11/07/20 180 lb (81.6 kg)    Assessment: Review of patient past medical history, allergies, medications, health status, including review of consultants reports, laboratory and other test data, was performed as part of comprehensive evaluation and provision of chronic care management services.   SDOH:  (Social Determinants of Health) assessments and interventions performed:    CCM Care Plan  No Known Allergies  Medications Reviewed Today     Reviewed by Westly Pam (Physician Assistant) on 02/14/21 at 1427  Med List Status: <None>   Medication Order Taking? Sig Documenting Provider Last Dose Status Informant  Accu-Chek Softclix Lancets lancets [id number] Yes Test BS 4 (four) times daily. Dx E11.69  Patient taking differently: 1 each by Other route 4 (four) times daily as needed. Test BS 4 (four) times daily. Dx E11.69   Sharion Balloon, FNP Taking Active Self  atorvastatin (LIPITOR) 10 MG tablet [id number] Yes TAKE 1 TABLET BY MOUTH  DAILY Dettinger, Fransisca Kaufmann, MD Taking Active   blood glucose meter kit and supplies [id number] Yes Dispense based on patient and insurance preference. Use up to four times daily as directed. (FOR ICD-10 E10.9, E11.9).  Patient taking differently: 1 each by Other route once. Dispense based on patient and insurance preference. Use up to four times daily as directed. (FOR ICD-10 E10.9, E11.9).   Sharion Balloon, FNP Taking Active Self  Blood Glucose Monitoring Suppl (ACCU-CHEK GUIDE) w/Device KIT [id number] Yes Test BS 4 (four) times daily. Dx E11.69 Sharion Balloon, FNP Taking Active Self  cetirizine (ZYRTEC) 10 MG tablet [id number] Yes Take 1 tablet by mouth once daily Evelina Dun A, FNP Taking Active   Continuous Blood Gluc Sensor (FREESTYLE LIBRE 2 SENSOR) MISC [id number] Yes Use to test blood sugars 6x daily as  directed Sharion Balloon, FNP Taking Active   dapagliflozin propanediol (FARXIGA) 10 MG TABS tablet [id number] Yes Take 1 tablet (10 mg total) by mouth daily before breakfast. Sharion Balloon, FNP Taking Active   dexlansoprazole (DEXILANT) 60 MG capsule [id number] Yes Take 1 capsule (60 mg total) by mouth daily. Sharion Balloon, FNP Taking Active   escitalopram (LEXAPRO) 10 MG tablet [id number] Yes Take 1 tablet (10 mg total) by mouth daily. Sharion Balloon, FNP Taking Active   fluticasone (FLONASE) 50 MCG/ACT nasal spray [id number] Yes Place 2 sprays into both nostrils daily. Sharion Balloon, FNP Taking Active   furosemide (LASIX) 20 MG tablet [id number] Yes Take 1 tablet (20 mg total) by mouth daily. Carlis Stable, NP Taking Active   gabapentin (NEURONTIN) 400 MG capsule [id number] Yes Take 1 capsule (400 mg total) by mouth 2 (two) times daily. Sharion Balloon, FNP Taking Active   glucose blood (ACCU-CHEK GUIDE) test strip [id number] Yes Test BS  4 (four) times daily. Dx E11.69  Patient taking differently: 1 each by Other route 4 (four) times daily as needed. Test BS 4 (four) times daily. Dx E11.69   Sharion Balloon, FNP Taking Active Self  insulin degludec (TRESIBA FLEXTOUCH) 100 UNIT/ML FlexTouch Pen [id number] Yes Inject 25 Units into the skin at bedtime. Sharion Balloon, FNP Taking Active   Insulin Pen Needle (B-D UF III MINI PEN NEEDLES) 31G X 5 MM MISC [id number] Yes USE WITH TRESIBA DAILY Evelina Dun A, FNP Taking Active   metFORMIN (GLUCOPHAGE-XR) 750 MG 24 hr tablet [id number] Yes TAKE 2 TABLETS BY MOUTH ONCE DAILY WITH BREAKFAST Hawks, Christy A, FNP Taking Active   nadolol (CORGARD) 40 MG tablet [id number] Yes Take 1 tablet by mouth once daily Annitta Needs, NP Taking Active   ondansetron (ZOFRAN) 4 MG tablet [id number] Yes Take 1 tablet (4 mg total) by mouth every 6 (six) hours as needed for nausea. Roxan Hockey, MD Taking Active   rifaximin (XIFAXAN) 550 MG TABS tablet [id number] Yes  Take 1 tablet (550 mg total) by mouth 2 (two) times daily. Carlis Stable, NP Taking Active   spironolactone (ALDACTONE) 50 MG tablet [id number] Yes Take 1 tablet by mouth once daily Sharion Balloon, FNP Taking Active   VITAMIN D PO [id number] Yes Take 1 capsule by mouth daily.  [provider] Taking Active Self            Patient Active Problem List   Diagnosis Date Noted   RUQ pain 02/14/2021   Diabetes mellitus ([place]) 10/25/2020   Esophageal varices ([place]) 08/17/2020   Portal hypertension ([place]) 08/17/2020   Gastroesophageal reflux disease 07/13/2020   Acute GI bleeding 12/23/2019   GI bleed 12/22/2019   Depression, major, single episode, mild ([place]) 05/20/2019   Dysphagia 03/01/2019   CVA (cerebral vascular accident) ([place]) 11/08/2018   History of ETOH abuse 02/25/2018   Primary biliary cholangitis ([place]) 11/05/2017   Hyperlipidemia associated with type 2 diabetes mellitus ([place]) 10/30/2017   Abnormal LFTs 09/16/2017   Calculus of gallbladder without cholecystitis without obstruction    Inguinal hernia 11/14/2016   Cirrhosis ([place]) 05/21/2016   Hypoalbuminemia 05/21/2016   Hyponatremia 05/21/2016   Hypokalemia 05/21/2016   HTN (hypertension) 37/48/2707   ALC (alcoholic liver cirrhosis) ([place]) 05/21/2016    Immunization History  Administered Date(s) Administered   Influenza,inj,Quad PF,6+ Mos 04/03/2018, 05/20/2019, 04/12/2020   Moderna Sars-Covid-2 Vaccination 11/17/2019, 12/15/2019   Pneumococcal Conjugate-13 09/26/2017   Pneumococcal Polysaccharide-23 03/03/2018   Tdap 09/26/2017   Zoster Recombinat (Shingrix) 11/27/2020    Conditions to be addressed/monitored: DMII  Care Plan : PHARMD MEDICATION MANAGEMENT  Updates made by Lavera Guise, [place] since 02/21/2021 12:00 AM     Problem: DISEASE PROGRESSION PREVENTION      Long-Range Goal: T2DM   Recent Progress: On track  Priority: High  Note:   Current Barriers:  Unable to independently monitor therapeutic  efficacy  Pharmacist Clinical Goal(s):  Over the next 90 days, patient will achieve adherence to monitoring guidelines and medication adherence to achieve therapeutic efficacy maintain control of T2DM as evidenced by GOAL A1C<7%  through collaboration with PharmD and provider.    Interventions: 1:1 collaboration with Sharion Balloon, FNP regarding development and update of comprehensive plan of care as evidenced by provider attestation and co-signature Inter-disciplinary care team collaboration (see longitudinal plan of care) Comprehensive medication review performed; medication list updated in electronic medical record  Diabetes: Uncontrolled (much improved); current treatment:TRESIBA  25 units, METFORMIN, FARXIGA;  EDUCATION PROVIDED FOR LIBRE 2 CGM--patient doing well on libre system PATIENT WILL USE THE READER TO SCAN SENSOR (PHONE IS NOT COMPATIBLE) A1C 7.1%, GFR 94 Current glucose readings: fasting glucose: 90-120, post prandial glucose: <190 (a few have been >200, but based on diet) Denies hypoglycemic/hyperglycemic symptoms Discussed meal planning options and Plate method for healthy eating Avoid sugary drinks and desserts Incorporate balanced protein, non starchy veggies, 1 serving of carbohydrate with each meal Increase water intake Increase physical activity as able Current exercise: N/A Educated on LIBRE, BLOOD SUGARS Counseled on DIABETES MEDICATIONS, LIBRE 2 CGM   Patient Goals/Self-Care Activities Over the next 90 days, patient will:  - take medications as prescribed check glucose CONTINUOUSLY USING LIBRE CGM, document, and provide at future appointments  Follow Up Plan: Telephone follow up appointment with care management team member scheduled for:  MONTH      Medication Assistance: None required.  Patient affirms current coverage meets needs.  Patient's preferred pharmacy is:  [facility] [address] [place] - [place]  [address] [place] [address] Phone: [phone] Fax: [phone]  [facility]  ([place] - [place] [address] [place] [address] Phone: [phone] Fax: [phone]   Follow Up:  Patient agrees to Care Plan and Follow-up.  Plan: Telephone follow up appointment with care management team member scheduled for:  2 months  Regina Eck, PharmD, BCPS Clinical Pharmacist, [place]  II Phone [phone]

## 2021-02-23 ENCOUNTER — Other Ambulatory Visit (HOSPITAL_COMMUNITY)
Admission: RE | Admit: 2021-02-23 | Discharge: 2021-02-23 | Disposition: A | Discharge status: Home / Self Care | Payer: Medicare HMO | Source: Ambulatory Visit | Attending: Gastroenterology | Admitting: Gastroenterology

## 2021-02-23 DIAGNOSIS — K7460 Unspecified cirrhosis of liver: Secondary | ICD-10-CM | POA: Diagnosis present

## 2021-02-23 DIAGNOSIS — Z1321 Encounter for screening for nutritional disorder: Secondary | ICD-10-CM | POA: Diagnosis not present

## 2021-02-23 LAB — VITAMIN D 25 HYDROXY (VIT D DEFICIENCY, FRACTURES): Vit D, 25-Hydroxy: 45.72 ng/mL (ref 30–100)

## 2021-02-24 LAB — HEPATITIS B SURFACE ANTIBODY, QUANTITATIVE: Hep B S AB Quant (Post): 4.1 m[IU]/mL — ABNORMAL LOW (ref >9.9)

## 2021-02-26 ENCOUNTER — Telehealth: Payer: Self-pay | Admitting: Gastroenterology

## 2021-02-26 NOTE — Telephone Encounter (Signed)
Pt called and left a vm, unable to make out what pt was calling for. I noticed there were labs that had not been resulted by Magda Paganini, pt may be calling about those. Was unable to reach pt. LMOM for pt to return my call.

## 2021-03-01 NOTE — Progress Notes (Signed)
Mailed pt prescription for hep b vaccines 3 part series.

## 2021-03-09 ENCOUNTER — Other Ambulatory Visit: Payer: Self-pay | Admitting: Family

## 2021-04-09 ENCOUNTER — Telehealth: Payer: Self-pay | Admitting: Family

## 2021-04-09 DIAGNOSIS — K219 Gastro-esophageal reflux disease without esophagitis: Secondary | ICD-10-CM

## 2021-04-09 DIAGNOSIS — E1169 Type 2 diabetes mellitus with other specified complication: Secondary | ICD-10-CM

## 2021-04-09 DIAGNOSIS — I10 Essential (primary) hypertension: Secondary | ICD-10-CM

## 2021-04-09 NOTE — Telephone Encounter (Signed)
Pt called stating that his insurance changed so he needs [place] to send all of his medicines to [facility] in [place].

## 2021-04-10 MED ORDER — TRESIBA FLEXTOUCH 100 UNIT/ML ~~LOC~~ SOPN: 25.0000 [IU] | PEN_INJECTOR | Freq: Every day | SUBCUTANEOUS | 0 refills | Status: DC

## 2021-04-10 MED ORDER — SPIRONOLACTONE 50 MG PO TABS: 50.0000 mg | ORAL_TABLET | Freq: Every day | ORAL | 0 refills | Status: DC

## 2021-04-10 MED ORDER — GABAPENTIN 400 MG PO CAPS: 400.0000 mg | ORAL_CAPSULE | Freq: Two times a day (BID) | ORAL | 0 refills | Status: DC

## 2021-04-10 MED ORDER — ATORVASTATIN CALCIUM 10 MG PO TABS: 10.0000 mg | ORAL_TABLET | Freq: Every day | ORAL | 2 refills | Status: DC

## 2021-04-10 MED ORDER — DEXLANSOPRAZOLE 60 MG PO CPDR: 1.0000 | DELAYED_RELEASE_CAPSULE | Freq: Every day | ORAL | 0 refills | Status: DC

## 2021-04-10 MED ORDER — METFORMIN HCL ER 750 MG PO TB24: ORAL_TABLET | ORAL | 0 refills | Status: DC

## 2021-04-10 NOTE — Telephone Encounter (Signed)
Pt aware refills sent to pharmacy 

## 2021-04-19 DIAGNOSIS — Z7984 Long term (current) use of oral hypoglycemic drugs: Secondary | ICD-10-CM | POA: Diagnosis not present

## 2021-04-19 DIAGNOSIS — Z794 Long term (current) use of insulin: Secondary | ICD-10-CM | POA: Diagnosis not present

## 2021-04-19 DIAGNOSIS — E1142 Type 2 diabetes mellitus with diabetic polyneuropathy: Secondary | ICD-10-CM | POA: Diagnosis not present

## 2021-04-19 DIAGNOSIS — J309 Allergic rhinitis, unspecified: Secondary | ICD-10-CM | POA: Diagnosis not present

## 2021-04-19 DIAGNOSIS — E785 Hyperlipidemia, unspecified: Secondary | ICD-10-CM | POA: Diagnosis not present

## 2021-04-19 DIAGNOSIS — I10 Essential (primary) hypertension: Secondary | ICD-10-CM | POA: Diagnosis not present

## 2021-04-19 DIAGNOSIS — R609 Edema, unspecified: Secondary | ICD-10-CM | POA: Diagnosis not present

## 2021-04-25 ENCOUNTER — Ambulatory Visit (INDEPENDENT_AMBULATORY_CARE_PROVIDER_SITE_OTHER): Payer: Medicare HMO | Admitting: Pharmacist

## 2021-04-25 DIAGNOSIS — E1169 Type 2 diabetes mellitus with other specified complication: Secondary | ICD-10-CM

## 2021-04-25 NOTE — Progress Notes (Signed)
Chronic Care Management Pharmacy Note  04/25/2021 Name:  Thomas Pratt MRN:  [id number] DOB:  1961-04-01  Summary: T2DM  Recommendations/Changes made from today's visit: Diabetes: Uncontrolled (much improved); current treatment: TRESIBA 26 units, METFORMIN, FARXIGA;  [place] 2 CGM--patient doing well on libre system PATIENT WILL USE THE READER TO SCAN SENSOR (PHONE IS NOT COMPATIBLE) A1C 7.1%, GFR 94 BG are running on the higher end of 100s per patient, several readings above 200 Will wait to adjust medications when patient comes in (consider GLP1 vs DPP4 pending patient preference) Denies changes in diet, etc Appt made with PCP for acute issues Current glucose readings: fasting glucose: 90-120, post prandial glucose: up to 200s Discussed meal planning options and Plate method for healthy eating EATS-EGGS/TOAST (B), MEAT &MASHED POTATOES/CORN (L)-->ONLY EATS TWICE DAILY Avoid sugary drinks and desserts Incorporate balanced protein, non starchy veggies, 1 serving of carbohydrate with each meal Increase water intake Increase physical activity as able Current exercise: N/A Educated on LIBRE, BLOOD SUGARS Counseled on DIABETES MEDICATIONS, LIBRE 2 CGM  Follow Up Plan: Telephone follow up appointment with care management team member scheduled for:  1 MONTH   Subjective: Thomas Pratt is an 60 y.o. year old male who is a primary patient of Sharion Balloon, FNP.  The CCM team was consulted for assistance with disease management and care coordination needs.    Engaged with patient by telephone for follow up visit in response to provider referral for pharmacy case management and/or care coordination services.   Consent to Services:  The patient was given information about Chronic Care Management services, agreed to services, and gave verbal consent prior to initiation of services.  Please see initial visit note for detailed documentation.   Patient  Care Team: Sharion Balloon, FNP as PCP - General (Family Medicine) Gala Romney, Cristopher Estimable, MD as Consulting Physician (Gastroenterology) Lavera Guise, [facility] (Pharmacist)  Objective:  Lab Results  Component Value Date   CREATININE 0.95 02/19/2021   CREATININE 1.01 10/25/2020   CREATININE 0.99 08/23/2020    Lab Results  Component Value Date   HGBA1C 7.1 (H) 10/25/2020   Last diabetic Eye exam:  Lab Results  Component Value Date/Time   HMDIABEYEEXA No Retinopathy 08/14/2020 12:00 AM    Last diabetic Foot exam: No results found for: HMDIABFOOTEX      Component Value Date/Time   CHOL 181 05/20/2019 1452   TRIG 193 (H) 05/20/2019 1452   HDL 45 05/20/2019 1452   CHOLHDL 4.0 05/20/2019 1452   CHOLHDL 3.4 09/26/2017 1101   LDLCALC 103 (H) 05/20/2019 1452   LDLCALC 108 (H) 09/26/2017 1101    Hepatic Function Latest Ref Rng & Units 02/19/2021 08/23/2020 07/13/2020  Total Protein 6.5 - 8.1 g/dL 8.3(H) 8.7(H) 7.4  Albumin 3.5 - 5.0 g/dL 4.6 4.5 4.3  AST 15 - 41 U/L _0 ALT 0 - 44 U/L _1 Alk Phosphatase 38 - 126 U/L 57 58 75  Total Bilirubin 0.3 - 1.2 mg/dL 0.7 1.0 0.4  Bilirubin, Direct 0.00 - 0.40 mg/dL - - 0.14    Lab Results  Component Value Date/Time   TSH 1.690 05/20/2019 02:52 PM   TSH 0.901 11/08/2018 02:01 PM   TSH 1.640 05/18/2018 10:01 AM    CBC Latest Ref Rng & Units 02/19/2021 08/23/2020 02/16/2020  WBC 4.0 - 10.5 K/uL 5.9 7.7 6.4  Hemoglobin 13.0 - 17.0 g/dL 15.9 15.4 14.0  Hematocrit 39.0 - 52.0 % 48.4  46.9 42.6  Platelets 150 - 400 K/uL 153 243 172    Lab Results  Component Value Date/Time   VD25OH 45.72 02/23/2021 10:23 AM   VD25OH 33.9 05/18/2018 10:01 AM    Clinical ASCVD: Yes  The ASCVD Risk score (Arnett DK, et al., 2019) failed to calculate for the following reasons:   The patient has a prior MI or stroke diagnosis    Other: (CHADS2VASc if Afib, PHQ9 if depression, MMRC or CAT for COPD, ACT, DEXA)  Social History   Tobacco Use  Smoking  Status Never  Smokeless Tobacco Never   BP Readings from Last 3 Encounters:  02/14/21 (!) 146/83  11/27/20 114/72  10/25/20 109/69   Pulse Readings from Last 3 Encounters:  02/14/21 66  11/27/20 62  10/25/20 61   Wt Readings from Last 3 Encounters:  02/14/21 183 lb 6.4 oz (83.2 kg)  11/27/20 185 lb 3.2 oz (84 kg)  11/07/20 180 lb (81.6 kg)    Assessment: Review of patient past medical history, allergies, medications, health status, including review of consultants reports, laboratory and other test data, was performed as part of comprehensive evaluation and provision of chronic care management services.   SDOH:  (Social Determinants of Health) assessments and interventions performed:    CCM Care Plan  No Known Allergies  Medications Reviewed Today     Reviewed by Lavera Guise, [facility] (Pharmacist) on 04/25/21 at 1351  Med List Status: <None>   Medication Order Taking? Sig Documenting Provider Last Dose Status Informant  Accu-Chek Softclix Lancets lancets [id number] No Test BS 4 (four) times daily. Dx E11.69  Patient taking differently: 1 each by Other route 4 (four) times daily as needed. Test BS 4 (four) times daily. Dx E11.69   Sharion Balloon, FNP Taking Active Self  atorvastatin (LIPITOR) 10 MG tablet [id number]  Take 1 tablet (10 mg total) by mouth daily. Sharion Balloon, FNP  Active   Patient taking differently:  Discontinued 04/25/21 1350 (Change in therapy) Blood Glucose Monitoring Suppl (ACCU-CHEK GUIDE) w/Device KIT [id number] No Test BS 4 (four) times daily. Dx E11.69 Sharion Balloon, FNP Taking Active Self  cetirizine (ZYRTEC) 10 MG tablet [id number] No Take 1 tablet by mouth once daily Evelina Dun A, FNP Taking Active   Continuous Blood Gluc Sensor (FREESTYLE LIBRE 2 SENSOR) MISC [id number] No Use to test blood sugars 6x daily as directed Sharion Balloon, FNP Taking Active   dexlansoprazole (DEXILANT) 60 MG capsule [id number]  Take 1 capsule (60 mg total) by mouth  daily. Evelina Dun A, FNP  Active   escitalopram (LEXAPRO) 10 MG tablet [id number] No Take 1 tablet (10 mg total) by mouth daily. Sharion Balloon, FNP Taking Active   FARXIGA 10 MG TABS tablet [id number]  TAKE 1 TABLET BY MOUTH ONCE DAILY BEFORE BREAKFAST Hawks, Christy A, FNP  Active   fluticasone (FLONASE) 50 MCG/ACT nasal spray [id number] No Place 2 sprays into both nostrils daily. Sharion Balloon, FNP Taking Active   furosemide (LASIX) 20 MG tablet [id number] No Take 1 tablet (20 mg total) by mouth daily. Carlis Stable, NP Taking Active   gabapentin (NEURONTIN) 400 MG capsule [id number]  Take 1 capsule (400 mg total) by mouth 2 (two) times daily. Evelina Dun A, FNP  Active   glucose blood (ACCU-CHEK GUIDE) test strip [id number] No Test BS 4 (four) times daily. Dx E11.69  Patient taking differently: 1 each by Other route 4 (four) times daily as needed. Test BS  4 (four) times daily. Dx E11.69   Sharion Balloon, FNP Taking Active Self  insulin degludec (TRESIBA FLEXTOUCH) 100 UNIT/ML FlexTouch Pen [id number]  Inject 25 Units into the skin at bedtime. Sharion Balloon, FNP  Active   Insulin Pen Needle (B-D UF III MINI PEN NEEDLES) 31G X 5 MM MISC [id number] No USE WITH TRESIBA DAILY Evelina Dun A, FNP Taking Active   metFORMIN (GLUCOPHAGE-XR) 750 MG 24 hr tablet [id number]  TAKE 2 TABLETS BY MOUTH ONCE DAILY WITH BREAKFAST Hawks, Christy A, FNP  Active   nadolol (CORGARD) 40 MG tablet [id number] No Take 1 tablet by mouth once daily Annitta Needs, NP Taking Active   ondansetron (ZOFRAN) 4 MG tablet [id number] No Take 1 tablet (4 mg total) by mouth every 6 (six) hours as needed for nausea. Roxan Hockey, MD Taking Active   rifaximin (XIFAXAN) 550 MG TABS tablet [id number] No Take 1 tablet (550 mg total) by mouth 2 (two) times daily. Carlis Stable, NP Taking Active   spironolactone (ALDACTONE) 50 MG tablet [id number]  Take 1 tablet (50 mg total) by mouth daily. Sharion Balloon, FNP  Active   VITAMIN D  PO [id number] No Take 1 capsule by mouth daily.  [provider] Taking Active Self            Patient Active Problem List   Diagnosis Date Noted   RUQ pain 02/14/2021   Diabetes mellitus ([place]) 10/25/2020   Esophageal varices ([place]) 08/17/2020   Portal hypertension ([place]) 08/17/2020   Gastroesophageal reflux disease 07/13/2020   Acute GI bleeding 12/23/2019   GI bleed 12/22/2019   Depression, major, single episode, mild ([place]) 05/20/2019   Dysphagia 03/01/2019   CVA (cerebral vascular accident) ([place]) 11/08/2018   History of ETOH abuse 02/25/2018   Primary biliary cholangitis ([place]) 11/05/2017   Hyperlipidemia associated with type 2 diabetes mellitus ([place]) 10/30/2017   Abnormal LFTs 09/16/2017   Calculus of gallbladder without cholecystitis without obstruction    Inguinal hernia 11/14/2016   Cirrhosis ([place]) 05/21/2016   Hypoalbuminemia 05/21/2016   Hyponatremia 05/21/2016   Hypokalemia 05/21/2016   HTN (hypertension) 38/46/6599   ALC (alcoholic liver cirrhosis) ([place]) 05/21/2016    Immunization History  Administered Date(s) Administered   Influenza,inj,Quad PF,6+ Mos 04/03/2018, 05/20/2019, 04/12/2020   Moderna Sars-Covid-2 Vaccination 11/17/2019, 12/15/2019   Pneumococcal Conjugate-13 09/26/2017   Pneumococcal Polysaccharide-23 03/03/2018   Tdap 09/26/2017   Zoster Recombinat (Shingrix) 11/27/2020    Conditions to be addressed/monitored: DMII  Care Plan : PHARMD MEDICATION MANAGEMENT  Updates made by Lavera Guise, [place] since 04/25/2021 12:00 AM     Problem: DISEASE PROGRESSION PREVENTION      Long-Range Goal: T2DM   Recent Progress: On track  Priority: High  Note:   Current Barriers:  Unable to independently monitor therapeutic efficacy  Pharmacist Clinical Goal(s):  Over the next 90 days, patient will achieve adherence to monitoring guidelines and medication adherence to achieve therapeutic efficacy maintain control of T2DM as evidenced by GOAL  A1C<7%  through collaboration with PharmD and provider.    Interventions: 1:1 collaboration with Sharion Balloon, FNP regarding development and update of comprehensive plan of care as evidenced by provider attestation and co-signature Inter-disciplinary care team collaboration (see longitudinal plan of care) Comprehensive medication review performed; medication list updated in electronic medical record  Diabetes: Uncontrolled (much improved); current treatment: TRESIBA 26 units, METFORMIN, FARXIGA;  [place] 2 CGM--patient doing well on libre system PATIENT WILL USE THE  READER TO SCAN SENSOR (PHONE IS NOT COMPATIBLE) A1C 7.1%, GFR 94 BG are running on the higher end of 100s per patient, several readings above 200 Will wait to adjust medications when patient comes in (consider GLP1 vs DPP4 pending patient preference) Denies changes in diet, etc Appt made with PCP for acute issues Current glucose readings: fasting glucose: 90-120, post prandial glucose: up to 200s Discussed meal planning options and Plate method for healthy eating EATS-EGGS/TOAST (B), MEAT &MASHED POTATOES/CORN (L)-->ONLY EATS TWICE DAILY Avoid sugary drinks and desserts Incorporate balanced protein, non starchy veggies, 1 serving of carbohydrate with each meal Increase water intake Increase physical activity as able Current exercise: N/A Educated on LIBRE, BLOOD SUGARS Counseled on DIABETES MEDICATIONS, LIBRE 2 CGM   Patient Goals/Self-Care Activities Over the next 90 days, patient will:  - take medications as prescribed check glucose CONTINUOUSLY USING LIBRE CGM, document, and provide at future appointments  Follow Up Plan: Telephone follow up appointment with care management team member scheduled for:  1 MONTH      Medication Assistance: None required.  Patient affirms current coverage meets needs. DUAL  Patient's preferred pharmacy is:  [place] [facility] [address], [place] - [address] [place] [address] Phone: [phone] Fax: [phone]  [facility]  ([place]) - [place], [place] [facility] [address] [place] [address] [place] [address] Phone: [phone] Fax: [phone]   Follow Up:  Patient agrees to Care Plan and Follow-up.  Plan: Telephone follow up appointment with care management team member scheduled for:  05/30/21  Regina Eck, PharmD, BCPS Clinical Pharmacist, [place]  II Phone [phone]

## 2021-04-25 NOTE — Patient Instructions (Signed)
Visit Information  PATIENT GOALS:  Goals Addressed               This Visit's Progress     Patient Stated     T2DM (pt-stated)        Current Barriers:  Unable to independently monitor therapeutic efficacy  Pharmacist Clinical Goal(s):  Over the next 90 days, patient will achieve adherence to monitoring guidelines and medication adherence to achieve therapeutic efficacy maintain control of T2DM as evidenced by GOAL A1C<7%  through collaboration with PharmD and provider.    Interventions: 1:1 collaboration with Sharion Balloon, FNP regarding development and update of comprehensive plan of care as evidenced by provider attestation and co-signature Inter-disciplinary care team collaboration (see longitudinal plan of care) Comprehensive medication review performed; medication list updated in electronic medical record  Diabetes: Uncontrolled (much improved); current treatment: TRESIBA 26 units, METFORMIN, FARXIGA;  Thomas Pratt 2 CGM--patient doing well on libre system PATIENT WILL USE THE READER TO SCAN SENSOR (PHONE IS NOT COMPATIBLE) A1C 7.1%, GFR 94 BG are running on the higher end of 100s per patient, several readings above 200 Will wait to adjust medications when patient comes in (consider GLP1 vs DPP4 pending patient preference) Denies changes in diet, etc Appt made with PCP for acute issues Current glucose readings: fasting glucose: 90-120, post prandial glucose: up to 200s Discussed meal planning options and Plate method for healthy eating EATS-EGGS/TOAST (B), MEAT &MASHED POTATOES/CORN (L)-->ONLY EATS TWICE DAILY Avoid sugary drinks and desserts Incorporate balanced protein, non starchy veggies, 1 serving of carbohydrate with each meal Increase water intake Increase physical activity as able Current exercise: N/A Educated on [place] on DIABETES MEDICATIONS, LIBRE 2 CGM   Patient Goals/Self-Care Activities Over the next 90  days, patient will:  - take medications as prescribed check glucose CONTINUOUSLY USING LIBRE CGM, document, and provide at future appointments  Follow Up Plan: Telephone follow up appointment with care management team member scheduled for:  1 MONTH         The patient verbalized understanding of instructions, educational materials, and care plan provided today and declined offer to receive copy of patient instructions, educational materials, and care plan.   Telephone follow up appointment with care management team member scheduled for: 05/29/21  Signature Regina Eck, PharmD, BCPS Clinical Pharmacist, [place]  II Phone [phone]

## 2021-04-27 ENCOUNTER — Ambulatory Visit: Payer: Medicare HMO | Admitting: Family

## 2021-05-01 ENCOUNTER — Encounter: Payer: Self-pay | Admitting: Family

## 2021-05-01 ENCOUNTER — Other Ambulatory Visit: Payer: Self-pay

## 2021-05-01 ENCOUNTER — Ambulatory Visit (INDEPENDENT_AMBULATORY_CARE_PROVIDER_SITE_OTHER): Payer: Medicare HMO | Admitting: Family

## 2021-05-01 VITALS — BP 102/69 | HR 64 | Temp 98.2°F | Resp 20 | Ht 68.0 in | Wt 179.0 lb

## 2021-05-01 DIAGNOSIS — E785 Hyperlipidemia, unspecified: Secondary | ICD-10-CM

## 2021-05-01 DIAGNOSIS — E1142 Type 2 diabetes mellitus with diabetic polyneuropathy: Secondary | ICD-10-CM | POA: Insufficient documentation

## 2021-05-01 DIAGNOSIS — Z23 Encounter for immunization: Secondary | ICD-10-CM | POA: Diagnosis not present

## 2021-05-01 DIAGNOSIS — I10 Essential (primary) hypertension: Secondary | ICD-10-CM | POA: Diagnosis not present

## 2021-05-01 DIAGNOSIS — E1169 Type 2 diabetes mellitus with other specified complication: Secondary | ICD-10-CM

## 2021-05-01 DIAGNOSIS — F320 Major depressive disorder, single episode, mild: Secondary | ICD-10-CM | POA: Diagnosis not present

## 2021-05-01 DIAGNOSIS — K219 Gastro-esophageal reflux disease without esophagitis: Secondary | ICD-10-CM | POA: Diagnosis not present

## 2021-05-01 LAB — BAYER DCA HB A1C WAIVED: HB A1C (BAYER DCA - WAIVED): 6.4 % — ABNORMAL HIGH (ref 4.8–5.6)

## 2021-05-01 MED ORDER — GABAPENTIN 400 MG PO CAPS: 400.0000 mg | ORAL_CAPSULE | Freq: Three times a day (TID) | ORAL | 0 refills | Status: DC

## 2021-05-01 NOTE — Patient Instructions (Signed)
Diabetes Mellitus and Nutrition, Adult When you have diabetes, or diabetes mellitus, it is very important to have healthy eating habits because your blood sugar (glucose) levels are greatly affected by what you eat and drink. Eating healthy foods in the right amounts, at about the same times every day, can help you:  Control your blood glucose.  Lower your risk of heart disease.  Improve your blood pressure.  Reach or maintain a healthy weight. What can affect my meal plan? Every person with diabetes is different, and each person has different needs for a meal plan. Your health care provider may recommend that you work with a dietitian to make a meal plan that is best for you. Your meal plan may vary depending on factors such as:  The calories you need.  The medicines you take.  Your weight.  Your blood glucose, blood pressure, and cholesterol levels.  Your activity level.  Other health conditions you have, such as heart or kidney disease. How do carbohydrates affect me? Carbohydrates, also called carbs, affect your blood glucose level more than any other type of food. Eating carbs naturally raises the amount of glucose in your blood. Carb counting is a method for keeping track of how many carbs you eat. Counting carbs is important to keep your blood glucose at a healthy level, especially if you use insulin or take certain oral diabetes medicines. It is important to know how many carbs you can safely have in each meal. This is different for every person. Your dietitian can help you calculate how many carbs you should have at each meal and for each snack. How does alcohol affect me? Alcohol can cause a sudden decrease in blood glucose (hypoglycemia), especially if you use insulin or take certain oral diabetes medicines. Hypoglycemia can be a life-threatening condition. Symptoms of hypoglycemia, such as sleepiness, dizziness, and confusion, are similar to symptoms of having too much  alcohol.  Do not drink alcohol if: ? Your health care provider tells you not to drink. ? You are pregnant, may be pregnant, or are planning to become pregnant.  If you drink alcohol: ? Do not drink on an empty stomach. ? Limit how much you use to:  0-1 drink a day for women.  0-2 drinks a day for men. ? Be aware of how much alcohol is in your drink. In the [place] one drink equals one 12 oz bottle of beer (355 mL), one 5 oz glass of wine (148 mL), or one 1 oz glass of hard liquor (44 mL). ? Keep yourself hydrated with water, diet soda, or unsweetened iced tea.  Keep in mind that regular soda, juice, and other mixers may contain a lot of sugar and must be counted as carbs. What are tips for following this plan? Reading food labels  Start by checking the serving size on the "Nutrition Facts" label of packaged foods and drinks. The amount of calories, carbs, fats, and other nutrients listed on the label is based on one serving of the item. Many items contain more than one serving per package.  Check the total grams (g) of carbs in one serving. You can calculate the number of servings of carbs in one serving by dividing the total carbs by 15. For example, if a food has 30 g of total carbs per serving, it would be equal to 2 servings of carbs.  Check the number of grams (g) of saturated fats and trans fats in one serving. Choose foods that have   a low amount or none of these fats.  Check the number of milligrams (mg) of salt (sodium) in one serving. Most people should limit total sodium intake to less than 2,300 mg per day.  Always check the nutrition information of foods labeled as "low-fat" or "nonfat." These foods may be higher in added sugar or refined carbs and should be avoided.  Talk to your dietitian to identify your daily goals for nutrients listed on the label. Shopping  Avoid buying canned, pre-made, or processed foods. These foods tend to be high in fat, sodium, and added  sugar.  Shop around the outside edge of the grocery store. This is where you will most often find fresh fruits and vegetables, bulk grains, fresh meats, and fresh dairy. Cooking  Use low-heat cooking methods, such as baking, instead of high-heat cooking methods like deep frying.  Cook using healthy oils, such as olive, canola, or sunflower oil.  Avoid cooking with butter, cream, or high-fat meats. Meal planning  Eat meals and snacks regularly, preferably at the same times every day. Avoid going long periods of time without eating.  Eat foods that are high in fiber, such as fresh fruits, vegetables, beans, and whole grains. Talk with your dietitian about how many servings of carbs you can eat at each meal.  Eat 4-6 oz (112-168 g) of lean protein each day, such as lean meat, chicken, fish, eggs, or tofu. One ounce (oz) of lean protein is equal to: ? 1 oz (28 g) of meat, chicken, or fish. ? 1 egg. ?  cup (62 g) of tofu.  Eat some foods each day that contain healthy fats, such as avocado, nuts, seeds, and fish.   What foods should I eat? Fruits Berries. Apples. Oranges. Peaches. Apricots. Plums. Grapes. Mango. Papaya. Pomegranate. Kiwi. Cherries. Vegetables Lettuce. Spinach. Leafy greens, including kale, chard, collard greens, and mustard greens. Beets. Cauliflower. Cabbage. Broccoli. Carrots. Green beans. Tomatoes. Peppers. Onions. Cucumbers. Brussels sprouts. Grains Whole grains, such as whole-wheat or whole-grain bread, crackers, tortillas, cereal, and pasta. Unsweetened oatmeal. Quinoa. Brown or wild rice. Meats and other proteins Seafood. Poultry without skin. Lean cuts of poultry and beef. Tofu. Nuts. Seeds. Dairy Low-fat or fat-free dairy products such as milk, yogurt, and cheese. The items listed above may not be a complete list of foods and beverages you can eat. Contact a dietitian for more information. What foods should I avoid? Fruits Fruits canned with  syrup. Vegetables Canned vegetables. Frozen vegetables with butter or cream sauce. Grains Refined white flour and flour products such as bread, pasta, snack foods, and cereals. Avoid all processed foods. Meats and other proteins Fatty cuts of meat. Poultry with skin. Breaded or fried meats. Processed meat. Avoid saturated fats. Dairy Full-fat yogurt, cheese, or milk. Beverages Sweetened drinks, such as soda or iced tea. The items listed above may not be a complete list of foods and beverages you should avoid. Contact a dietitian for more information. Questions to ask a health care provider  Do I need to meet with a diabetes educator?  Do I need to meet with a dietitian?  What number can I call if I have questions?  When are the best times to check my blood glucose? Where to find more information:  American Diabetes Association: diabetes.org  Academy of Nutrition and Dietetics: www.eatright.org  National Institute of Diabetes and Digestive and Kidney Diseases: www.niddk.nih.gov  Association of Diabetes Care and Education Specialists: www.diabeteseducator.org Summary  It is important to have healthy eating   habits because your blood sugar (glucose) levels are greatly affected by what you eat and drink.  A healthy meal plan will help you control your blood glucose and maintain a healthy lifestyle.  Your health care provider may recommend that you work with a dietitian to make a meal plan that is best for you.  Keep in mind that carbohydrates (carbs) and alcohol have immediate effects on your blood glucose levels. It is important to count carbs and to use alcohol carefully. This information is not intended to replace advice given to you by your health care provider. Make sure you discuss any questions you have with your health care provider. Document Revised: 06/15/2019 Document Reviewed: 06/15/2019 [employer]vier Patient Education  2021 [employer]  

## 2021-05-01 NOTE — Progress Notes (Signed)
Subjective:    Patient ID: Thomas Pratt, male    DOB: 01-30-1961, 60 y.o.   MRN: [id number]  Chief Complaint  Patient presents with   Medical Management of Chronic Issues    Not feeling good. Feet swelling   PT presents to the office today for chronic follow up. He is followed by our clinical pharmacists  for DM education.  He is followed by GI  for cirrhosis of liver. He has been sober since 03/2016 and needs to get counseling program to get on transplant list. PT is stable at this time.  He is complaining of bilateral foot and leg pain when walking. States it is a numbing pain of 6 out 10.  Hypertension This is a chronic problem. The current episode started more than 1 year ago. The problem has been resolved since onset. The problem is controlled. Associated symptoms include malaise/fatigue and peripheral edema. Pertinent negatives include no blurred vision or shortness of breath. Risk factors for coronary artery disease include dyslipidemia and diabetes mellitus. The current treatment provides moderate improvement.  Gastroesophageal Reflux He complains of belching and heartburn. This is a chronic problem. The current episode started more than 1 year ago. The problem occurs occasionally. The problem has been waxing and waning. Risk factors include obesity. He has tried a PPI for the symptoms. The treatment provided moderate relief.  Hyperlipidemia This is a chronic problem. The current episode started more than 1 year ago. The problem is controlled. Recent lipid tests were reviewed and are normal. Exacerbating diseases include obesity. Pertinent negatives include no shortness of breath. Current antihyperlipidemic treatment includes statins. The current treatment provides moderate improvement of lipids. Risk factors for coronary artery disease include dyslipidemia, diabetes mellitus, hypertension, male sex and a sedentary lifestyle.  Diabetes He presents for his follow-up diabetic visit.  He has type 2 diabetes mellitus. Associated symptoms include foot paresthesias. Pertinent negatives for diabetes include no blurred vision. Symptoms are stable. Diabetic complications include peripheral neuropathy. Risk factors for coronary artery disease include dyslipidemia, diabetes mellitus, male sex, hypertension and sedentary lifestyle. He is following a generally unhealthy diet. His overall blood glucose range is 130-140 mg/dl. An ACE inhibitor/angiotensin II receptor blocker is being taken. Eye exam is current.  Depression        This is a chronic problem.  The current episode started more than 1 year ago.   Associated symptoms include helplessness, hopelessness, irritable, restlessness and sad.  Past treatments include SSRIs - Selective serotonin reuptake inhibitors.    Review of Systems  Constitutional:  Positive for malaise/fatigue.  Eyes:  Negative for blurred vision.  Respiratory:  Negative for shortness of breath.   Gastrointestinal:  Positive for heartburn.  Psychiatric/Behavioral:  Positive for depression.   All other systems reviewed and are negative.     Objective:   Physical Exam Vitals reviewed.  Constitutional:      General: He is irritable. He is not in acute distress.    Appearance: He is well-developed. He is obese.  HENT:     Head: Normocephalic.     Right Ear: Tympanic membrane normal.     Left Ear: Tympanic membrane normal.  Eyes:     General:        Right eye: No discharge.        Left eye: No discharge.     Pupils: Pupils are equal, round, and reactive to light.  Neck:     Thyroid: No thyromegaly.  Cardiovascular:     Rate  and Rhythm: Normal rate and regular rhythm.     Heart sounds: Normal heart sounds. No murmur heard. Pulmonary:     Effort: Pulmonary effort is normal. No respiratory distress.     Breath sounds: Normal breath sounds. No wheezing.  Abdominal:     General: Bowel sounds are normal. There is no distension.     Palpations: Abdomen is  soft.     Tenderness: There is no abdominal tenderness.  Musculoskeletal:        General: No tenderness. Normal range of motion.     Cervical back: Normal range of motion and neck supple.  Skin:    General: Skin is warm and dry.     Findings: No erythema or rash.  Neurological:     Mental Status: He is alert and oriented to person, place, and time.     Cranial Nerves: No cranial nerve deficit.     Deep Tendon Reflexes: Reflexes are normal and symmetric.  Psychiatric:        Behavior: Behavior normal.        Thought Content: Thought content normal.        Judgment: Judgment normal.     BP 102/69   Pulse 64   Temp 98.2 F (36.8 C) (Temporal)   Resp 20   Ht 5' 8" (1.727 m)   Wt 179 lb (81.2 kg)   SpO2 96%   BMI 27.22 kg/m       Assessment & Plan:  Thomas Pratt comes in today with chief complaint of Medical Management of Chronic Issues (Not feeling good. Feet swelling)   Diagnosis and orders addressed:  1. Type 2 diabetes mellitus with other specified complication, without long-term current use of insulin (HCC) - Bayer DCA Hb A1c Waived - Microalbumin / creatinine urine ratio  2. Hyperlipidemia associated with type 2 diabetes mellitus ([place]) - Lipid panel  3. Primary hypertension  - CBC with Differential/Platelet - CMP14+EGFR  4. Gastroesophageal reflux disease, unspecified whether esophagitis present  5. Depression, major, single episode, mild ([place])  6. Diabetic polyneuropathy associated with type 2 diabetes mellitus (HCC) Will increase gabapentin to TID from BID - gabapentin (NEURONTIN) 400 MG capsule; Take 1 capsule (400 mg total) by mouth 3 (three) times daily.  Dispense: 180 capsule; Refill: 0   Labs pending Health Maintenance reviewed Diet and exercise encouraged  Follow up plan: 3 months    Evelina Dun, FNP

## 2021-05-02 LAB — CBC WITH DIFFERENTIAL/PLATELET
Basophils Absolute: 0 10*3/uL (ref 0.0–0.2)
Basos: 0 %
EOS (ABSOLUTE): 0.1 10*3/uL (ref 0.0–0.4)
Eos: 2 %
Hematocrit: 46.7 % (ref 37.5–51.0)
Hemoglobin: 15.6 g/dL (ref 13.0–17.7)
Immature Grans (Abs): 0 10*3/uL (ref 0.0–0.1)
Immature Granulocytes: 0 %
Lymphocytes Absolute: 2.9 10*3/uL (ref 0.7–3.1)
Lymphs: 42 %
MCH: 28.9 pg (ref 26.6–33.0)
MCHC: 33.4 g/dL (ref 31.5–35.7)
MCV: 87 fL (ref 79–97)
Monocytes Absolute: 0.5 10*3/uL (ref 0.1–0.9)
Monocytes: 7 %
Neutrophils Absolute: 3.3 10*3/uL (ref 1.4–7.0)
Neutrophils: 49 %
Platelets: 188 10*3/uL (ref 150–450)
RBC: 5.4 x10E6/uL (ref 4.14–5.80)
RDW: 14.6 % (ref 11.6–15.4)
WBC: 6.8 10*3/uL (ref 3.4–10.8)

## 2021-05-02 LAB — LIPID PANEL
Chol/HDL Ratio: 4.2 ratio (ref 0.0–5.0)
Cholesterol, Total: 135 mg/dL (ref 100–199)
HDL: 32 mg/dL — ABNORMAL LOW (ref >39)
LDL Chol Calc (NIH): 72 mg/dL (ref 0–99)
Triglycerides: 185 mg/dL — ABNORMAL HIGH (ref 0–149)
VLDL Cholesterol Cal: 31 mg/dL (ref 5–40)

## 2021-05-02 LAB — CMP14+EGFR
ALT: 23 IU/L (ref 0–44)
AST: 26 IU/L (ref 0–40)
Albumin/Globulin Ratio: 1.5 (ref 1.2–2.2)
Albumin: 4.7 g/dL (ref 3.8–4.8)
Alkaline Phosphatase: 58 IU/L (ref 44–121)
BUN/Creatinine Ratio: 18 (ref 10–24)
BUN: 19 mg/dL (ref 8–27)
Bilirubin Total: 0.3 mg/dL (ref 0.0–1.2)
CO2: 23 mmol/L (ref 20–29)
Calcium: 9.9 mg/dL (ref 8.6–10.2)
Chloride: 98 mmol/L (ref 96–106)
Creatinine, Ser: 1.03 mg/dL (ref 0.76–1.27)
Globulin, Total: 3.1 g/dL (ref 1.5–4.5)
Glucose: 186 mg/dL — ABNORMAL HIGH (ref 70–99)
Potassium: 5 mmol/L (ref 3.5–5.2)
Sodium: 137 mmol/L (ref 134–144)
Total Protein: 7.8 g/dL (ref 6.0–8.5)
eGFR: 83 mL/min/{1.73_m2} (ref >59)

## 2021-05-14 ENCOUNTER — Encounter: Payer: Self-pay | Admitting: Family Medicine

## 2021-05-14 ENCOUNTER — Ambulatory Visit (INDEPENDENT_AMBULATORY_CARE_PROVIDER_SITE_OTHER): Payer: Medicare HMO | Admitting: Family Medicine

## 2021-05-14 DIAGNOSIS — B3749 Other urogenital candidiasis: Secondary | ICD-10-CM

## 2021-05-14 MED ORDER — NYSTATIN 100000 UNIT/GM EX CREA
1.0000 | TOPICAL_CREAM | Freq: Two times a day (BID) | CUTANEOUS | 2 refills | Status: DC
Start: 2021-05-14 — End: 2022-02-18

## 2021-05-14 NOTE — Progress Notes (Signed)
Virtual Visit via telephone Note  I connected with [name] on 05/14/21 at 1417 by telephone and verified that I am speaking with the correct person using two identifiers. Gurkirat Basher is currently located at home and  patient  are currently with her during visit. The provider, Fransisca Kaufmann Brendin Situ, MD is located in their office at time of visit.  Call ended at 1423  I discussed the limitations, risks, security and privacy concerns of performing an evaluation and management service by telephone and the availability of in person appointments. I also discussed with the patient that there may be a patient responsible charge related to this service. The patient expressed understanding and agreed to proceed.   History and Present Illness: Patient is calling in for flaky rash around his penis and he had a cream.  He had the same thing about 5 months ago.  He says right now what it is it is a small pink spot on the end of his penis with a little bit of white flaky discharge around the head of the penis inside the foreskin.  He denies any fevers or chills or dysuria or black spots  1. Yeast dermatitis of penis     Outpatient Encounter Medications as of 05/14/2021  Medication Sig   nystatin cream (MYCOSTATIN) Apply 1 application topically 2 (two) times daily.   Accu-Chek Softclix Lancets lancets Test BS 4 (four) times daily. Dx E11.69 (Patient taking differently: 1 each by Other route 4 (four) times daily as needed. Test BS 4 (four) times daily. Dx E11.69)   atorvastatin (LIPITOR) 10 MG tablet Take 1 tablet (10 mg total) by mouth daily.   Blood Glucose Monitoring Suppl (ACCU-CHEK GUIDE) w/Device KIT Test BS 4 (four) times daily. Dx E11.69   cetirizine (ZYRTEC) 10 MG tablet Take 1 tablet by mouth once daily   Continuous Blood Gluc Sensor (FREESTYLE LIBRE 2 SENSOR) MISC Use to test blood sugars 6x daily as directed   dexlansoprazole (DEXILANT) 60 MG capsule Take 1 capsule (60 mg total)  by mouth daily.   escitalopram (LEXAPRO) 10 MG tablet Take 1 tablet (10 mg total) by mouth daily.   FARXIGA 10 MG TABS tablet TAKE 1 TABLET BY MOUTH ONCE DAILY BEFORE BREAKFAST   fluticasone (FLONASE) 50 MCG/ACT nasal spray Place 2 sprays into both nostrils daily.   furosemide (LASIX) 20 MG tablet Take 1 tablet (20 mg total) by mouth daily.   gabapentin (NEURONTIN) 400 MG capsule Take 1 capsule (400 mg total) by mouth 3 (three) times daily.   glucose blood (ACCU-CHEK GUIDE) test strip Test BS 4 (four) times daily. Dx E11.69 (Patient taking differently: 1 each by Other route 4 (four) times daily as needed. Test BS 4 (four) times daily. Dx E11.69)   insulin degludec (TRESIBA FLEXTOUCH) 100 UNIT/ML FlexTouch Pen Inject 25 Units into the skin at bedtime. (Patient taking differently: Inject 26 Units into the skin at bedtime.)   Insulin Pen Needle (B-D UF III MINI PEN NEEDLES) 31G X 5 MM MISC USE WITH TRESIBA DAILY   metFORMIN (GLUCOPHAGE-XR) 750 MG 24 hr tablet TAKE 2 TABLETS BY MOUTH ONCE DAILY WITH BREAKFAST   nadolol (CORGARD) 40 MG tablet Take 1 tablet by mouth once daily (Patient not taking: Reported on 05/01/2021)   rifaximin (XIFAXAN) 550 MG TABS tablet Take 1 tablet (550 mg total) by mouth 2 (two) times daily.   spironolactone (ALDACTONE) 50 MG tablet Take 1 tablet (50 mg total) by mouth daily.   VITAMIN D  PO Take 1 capsule by mouth daily.    No facility-administered encounter medications on file as of 05/14/2021.    Review of Systems  Constitutional:  Negative for chills and fever.  Respiratory:  Negative for shortness of breath and wheezing.   Cardiovascular:  Negative for chest pain and leg swelling.  Gastrointestinal:  Negative for abdominal pain.  Genitourinary:  Negative for dysuria, frequency, hematuria and urgency.  Musculoskeletal:  Negative for back pain and gait problem.  Skin:  Positive for rash. Negative for color change.  All other systems reviewed and are  negative.  Observations/Objective: Patient sounds comfortable and in no acute distress  Assessment and Plan: Problem List Items Addressed This Visit   None Visit Diagnoses     Yeast dermatitis of penis    -  Primary   Relevant Medications   nystatin cream (MYCOSTATIN)     Sounds like yeast dermatitis, has had this before, will send him prescription for him and gave instructions that if anything worsens he needs to get in right away because he is on Farxiga  Gave instructions to watch for signs of necrosis or worsening infection  Follow up plan: Return if symptoms worsen or fail to improve.     I discussed the assessment and treatment plan with the patient. The patient was provided an opportunity to ask questions and all were answered. The patient agreed with the plan and demonstrated an understanding of the instructions.   The patient was advised to call back or seek an in-person evaluation if the symptoms worsen or if the condition fails to improve as anticipated.  The above assessment and management plan was discussed with the patient. The patient verbalized understanding of and has agreed to the management plan. Patient is aware to call the clinic if symptoms persist or worsen. Patient is aware when to return to the clinic for a follow-up visit. Patient educated on when it is appropriate to go to the emergency department.    I provided 6 minutes of non-face-to-face time during this encounter.    Worthy Rancher, MD

## 2021-05-21 DIAGNOSIS — E1169 Type 2 diabetes mellitus with other specified complication: Secondary | ICD-10-CM

## 2021-05-30 ENCOUNTER — Telehealth: Payer: Medicare HMO

## 2021-06-01 ENCOUNTER — Ambulatory Visit: Payer: Medicare HMO | Admitting: Family

## 2021-06-06 ENCOUNTER — Encounter: Payer: Self-pay | Admitting: Family

## 2021-06-08 ENCOUNTER — Telehealth: Payer: Medicare HMO

## 2021-06-19 ENCOUNTER — Other Ambulatory Visit: Payer: Self-pay | Admitting: Family

## 2021-06-20 ENCOUNTER — Other Ambulatory Visit: Payer: Self-pay | Admitting: Family

## 2021-06-20 DIAGNOSIS — E1169 Type 2 diabetes mellitus with other specified complication: Secondary | ICD-10-CM

## 2021-06-20 DIAGNOSIS — E1165 Type 2 diabetes mellitus with hyperglycemia: Secondary | ICD-10-CM | POA: Diagnosis not present

## 2021-06-26 ENCOUNTER — Encounter: Payer: Self-pay | Admitting: Family

## 2021-06-26 ENCOUNTER — Ambulatory Visit (INDEPENDENT_AMBULATORY_CARE_PROVIDER_SITE_OTHER): Payer: Medicare Other | Admitting: Family

## 2021-06-26 VITALS — BP 115/66 | HR 65 | Temp 97.3°F | Ht 68.0 in | Wt 179.0 lb

## 2021-06-26 DIAGNOSIS — E785 Hyperlipidemia, unspecified: Secondary | ICD-10-CM

## 2021-06-26 DIAGNOSIS — E1142 Type 2 diabetes mellitus with diabetic polyneuropathy: Secondary | ICD-10-CM | POA: Diagnosis not present

## 2021-06-26 DIAGNOSIS — I10 Essential (primary) hypertension: Secondary | ICD-10-CM | POA: Diagnosis not present

## 2021-06-26 DIAGNOSIS — E1169 Type 2 diabetes mellitus with other specified complication: Secondary | ICD-10-CM | POA: Diagnosis not present

## 2021-06-26 DIAGNOSIS — K7030 Alcoholic cirrhosis of liver without ascites: Secondary | ICD-10-CM

## 2021-06-26 DIAGNOSIS — K219 Gastro-esophageal reflux disease without esophagitis: Secondary | ICD-10-CM | POA: Diagnosis not present

## 2021-06-26 DIAGNOSIS — F320 Major depressive disorder, single episode, mild: Secondary | ICD-10-CM

## 2021-06-26 DIAGNOSIS — Z794 Long term (current) use of insulin: Secondary | ICD-10-CM

## 2021-06-26 LAB — BAYER DCA HB A1C WAIVED: HB A1C (BAYER DCA - WAIVED): 6.8 % — ABNORMAL HIGH (ref 4.8–5.6)

## 2021-06-26 MED ORDER — GABAPENTIN 600 MG PO TABS: 600.0000 mg | ORAL_TABLET | Freq: Three times a day (TID) | ORAL | 1 refills | Status: DC

## 2021-06-26 NOTE — Progress Notes (Signed)
Subjective:    Patient ID: Thomas Pratt, male    DOB: [birth year], 60 y.o.   MRN: [id number]  Chief Complaint  Patient presents with   Medical Management of Chronic Issues   PT presents to the office today for chronic follow up. He is followed by our clinical pharmacists  for DM education.  He is followed by GI  for cirrhosis of liver. He has been sober since 03/2016 and needs to get counseling program to get on transplant list. PT is stable at this time.   He is complaining of bilateral foot and leg pain when walking. States it is a numbing pain of 9 out 10.  Diabetes He presents for his follow-up diabetic visit. He has type 2 diabetes mellitus. There are no hypoglycemic associated symptoms. Associated symptoms include foot paresthesias. Pertinent negatives for diabetes include no blurred vision. Symptoms are stable. Diabetic complications include peripheral neuropathy. Risk factors for coronary artery disease include dyslipidemia, diabetes mellitus, male sex, sedentary lifestyle and hypertension. He is following a generally unhealthy diet. His overall blood glucose range is >200 mg/dl.  Hypertension This is a chronic problem. The current episode started more than 1 year ago. The problem has been resolved since onset. The problem is controlled. Associated symptoms include malaise/fatigue. Pertinent negatives include no blurred vision, peripheral edema or shortness of breath. Risk factors for coronary artery disease include dyslipidemia, diabetes mellitus, obesity and male gender. The current treatment provides moderate improvement.  Gastroesophageal Reflux He complains of belching and heartburn. This is a chronic problem. The current episode started more than 1 year ago. The problem occurs occasionally. Risk factors include obesity. He has tried a PPI for the symptoms. The treatment provided moderate relief.  Hyperlipidemia This is a chronic problem. The current episode started more than 1  year ago. Exacerbating diseases include obesity. Pertinent negatives include no shortness of breath. Current antihyperlipidemic treatment includes statins. The current treatment provides moderate improvement of lipids. Risk factors for coronary artery disease include dyslipidemia, diabetes mellitus, hypertension, male sex, post-menopausal and a sedentary lifestyle.  Depression        This is a chronic problem.  The current episode started more than 1 year ago.   The onset quality is gradual.   Associated symptoms include hopelessness, restlessness and sad.  Associated symptoms include no helplessness.    Review of Systems  Constitutional:  Positive for malaise/fatigue.  Eyes:  Negative for blurred vision.  Respiratory:  Negative for shortness of breath.   Gastrointestinal:  Positive for heartburn.  Psychiatric/Behavioral:  Positive for depression.   All other systems reviewed and are negative.     Objective:   Physical Exam Vitals reviewed.  Constitutional:      General: He is not in acute distress.    Appearance: He is well-developed.  HENT:     Head: Normocephalic.     Right Ear: Tympanic membrane normal.     Left Ear: Tympanic membrane normal.  Eyes:     General:        Right eye: No discharge.        Left eye: No discharge.     Pupils: Pupils are equal, round, and reactive to light.  Neck:     Thyroid: No thyromegaly.  Cardiovascular:     Rate and Rhythm: Normal rate and regular rhythm.     Heart sounds: Normal heart sounds. No murmur heard. Pulmonary:     Effort: Pulmonary effort is normal. No respiratory distress.  Breath sounds: Normal breath sounds. No wheezing.  Abdominal:     General: Bowel sounds are normal. There is no distension.     Palpations: Abdomen is soft.     Tenderness: There is abdominal tenderness (RLQ).  Musculoskeletal:        General: No tenderness. Normal range of motion.     Cervical back: Normal range of motion and neck supple.  Skin:     General: Skin is warm and dry.     Findings: No erythema or rash.  Neurological:     Mental Status: He is alert and oriented to person, place, and time.     Cranial Nerves: No cranial nerve deficit.     Deep Tendon Reflexes: Reflexes are normal and symmetric.  Psychiatric:        Behavior: Behavior normal.        Thought Content: Thought content normal.        Judgment: Judgment normal.      BP 115/66   Pulse 65   Temp (!) 97.3 F (36.3 C) (Temporal)   Ht _0  (1.727 m)   Wt 179 lb (81.2 kg)   BMI 27.22 kg/m      Assessment & Plan:  Thomas Pratt comes in today with chief complaint of Medical Management of Chronic Issues   Diagnosis and orders addressed:  1. Primary hypertension - CMP14+EGFR - CBC with Differential/Platelet  2. Gastroesophageal reflux disease, unspecified whether esophagitis present - CMP14+EGFR - CBC with Differential/Platelet  3. Alcoholic cirrhosis of liver without ascites (HCC) - CMP14+EGFR - CBC with Differential/Platelet  4. Hyperlipidemia associated with type 2 diabetes mellitus (HCC) - CMP14+EGFR - CBC with Differential/Platelet  5. Type 2 diabetes mellitus with other specified complication, with long-term current use of insulin (HCC) - CMP14+EGFR - CBC with Differential/Platelet - Bayer DCA Hb A1c Waived - gabapentin (NEURONTIN) 600 MG tablet; Take 1 tablet (600 mg total) by mouth 3 (three) times daily.  Dispense: 90 tablet; Refill: 1  6. Diabetic polyneuropathy associated with type 2 diabetes mellitus (HCC) Will increase Gabapentin to 600 mg TID from 400 mg TID - CMP14+EGFR - CBC with Differential/Platelet - gabapentin (NEURONTIN) 600 MG tablet; Take 1 tablet (600 mg total) by mouth 3 (three) times daily.  Dispense: 90 tablet; Refill: 1  7. Depression, major, single episode, mild (HCC) - CMP14+EGFR - CBC with Differential/Platelet   Labs pending Health Maintenance reviewed Diet and exercise encouraged  Follow up  plan: 3 months    Evelina Dun, FNP

## 2021-06-26 NOTE — Patient Instructions (Signed)
Peripheral Neuropathy ?Peripheral neuropathy is a type of nerve damage. It affects nerves that carry signals between the spinal cord and the arms, legs, and the rest of the body (peripheral nerves). It does not affect nerves in the spinal cord or brain. In peripheral neuropathy, one nerve or a group of nerves may be damaged. Peripheral neuropathy is a broad category that includes many specific nerve disorders, like diabetic neuropathy, hereditary neuropathy, and carpal tunnel syndrome. ?What are the causes? ?This condition may be caused by: ?Diabetes. This is the most common cause of peripheral neuropathy. ?Nerve injury. ?Pressure or stress on a nerve that lasts a long time. ?Lack (deficiency) of B vitamins. This can result from alcoholism, poor diet, or a restricted diet. ?Infections. ?Autoimmune diseases, such as rheumatoid arthritis and systemic lupus erythematosus. ?Nerve diseases that are passed from parent to child (inherited). ?Some medicines, such as cancer medicines (chemotherapy). ?Poisonous (toxic) substances, such as lead and mercury. ?Too little blood flowing to the legs. ?Kidney disease. ?Thyroid disease. ?In some cases, the cause of this condition is not known. ?What are the signs or symptoms? ?Symptoms of this condition depend on which of your nerves is damaged. Common symptoms include: ?Loss of feeling (numbness) in the feet, hands, or both. ?Tingling in the feet, hands, or both. ?Burning pain. ?Very sensitive skin. ?Weakness. ?Not being able to move a part of the body (paralysis). ?Muscle twitching. ?Clumsiness or poor coordination. ?Loss of balance. ?Not being able to control your bladder. ?Feeling dizzy. ?Sexual problems. ?How is this diagnosed? ?Diagnosing and finding the cause of peripheral neuropathy can be difficult. Your health care provider will take your medical history and do a physical exam. A neurological exam will also be done. This involves checking things that are affected by your  brain, spinal cord, and nerves (nervous system). For example, your health care provider will check your reflexes, how you move, and what you can feel. ?You may have other tests, such as: ?Blood tests. ?Electromyogram (EMG) and nerve conduction tests. These tests check nerve function and how well the nerves are controlling the muscles. ?Imaging tests, such as CT scans or MRI to rule out other causes of your symptoms. ?Removing a small piece of nerve to be examined in a lab (nerve biopsy). ?Removing and examining a small amount of the fluid that surrounds the brain and spinal cord (lumbar puncture). ?How is this treated? ?Treatment for this condition may involve: ?Treating the underlying cause of the neuropathy, such as diabetes, kidney disease, or vitamin deficiencies. ?Stopping medicines that can cause neuropathy, such as chemotherapy. ?Medicine to help relieve pain. Medicines may include: ?Prescription or over-the-counter pain medicine. ?Antiseizure medicine. ?Antidepressants. ?Pain-relieving patches that are applied to painful areas of skin. ?Surgery to relieve pressure on a nerve or to destroy a nerve that is causing pain. ?Physical therapy to help improve movement and balance. ?Devices to help you move around (assistive devices). ?Follow these instructions at home: ?Medicines ?Take over-the-counter and prescription medicines only as told by your health care provider. Do not take any other medicines without first asking your health care provider. ?Do not drive or use heavy machinery while taking prescription pain medicine. ?Lifestyle ? ?Do not use any products that contain nicotine or tobacco, such as cigarettes and e-cigarettes. Smoking keeps blood from reaching damaged nerves. If you need help quitting, ask your health care provider. ?Avoid or limit alcohol. Too much alcohol can cause a vitamin B deficiency, and vitamin B is needed for healthy nerves. ?Eat a   healthy diet. This includes: ?Eating foods that are  high in fiber, such as fresh fruits and vegetables, whole grains, and beans. ?Limiting foods that are high in fat and processed sugars, such as fried or sweet foods. ?General instructions ? ?If you have diabetes, work closely with your health care provider to keep your blood sugar under control. ?If you have numbness in your feet: ?Check every day for signs of injury or infection. Watch for redness, warmth, and swelling. ?Wear padded socks and comfortable shoes. These help protect your feet. ?Develop a good support system. Living with peripheral neuropathy can be stressful. Consider talking with a mental health specialist or joining a support group. ?Use assistive devices and attend physical therapy as told by your health care provider. This may include using a walker or a cane. ?Keep all follow-up visits as told by your health care provider. This is important. ?Contact a health care provider if: ?You have new signs or symptoms of peripheral neuropathy. ?You are struggling emotionally from dealing with peripheral neuropathy. ?Your pain is not well-controlled. ?Get help right away if: ?You have an injury or infection that is not healing normally. ?You develop new weakness in an arm or leg. ?You have fallen or do so frequently. ?Summary ?Peripheral neuropathy is when the nerves in the arms, or legs are damaged, resulting in numbness, weakness, or pain. ?There are many causes of peripheral neuropathy, including diabetes, pinched nerves, vitamin deficiencies, autoimmune disease, and hereditary conditions. ?Diagnosing and finding the cause of peripheral neuropathy can be difficult. Your health care provider will take your medical history, do a physical exam, and do tests, including blood tests and nerve function tests. ?Treatment involves treating the underlying cause of the neuropathy and taking medicines to help control pain. Physical therapy and assistive devices may also help. ?This information is not intended to  replace advice given to you by your health care provider. Make sure you discuss any questions you have with your health care provider. ?Document Revised: 04/18/2020 Document Reviewed: 04/18/2020 ?[employer] Patient Education ? 2022 [employer] ? ?

## 2021-06-27 LAB — CBC WITH DIFFERENTIAL/PLATELET
Basophils Absolute: 0 10*3/uL (ref 0.0–0.2)
Basos: 0 %
EOS (ABSOLUTE): 0.2 10*3/uL (ref 0.0–0.4)
Eos: 2 %
Hematocrit: 44 % (ref 37.5–51.0)
Hemoglobin: 15.6 g/dL (ref 13.0–17.7)
Immature Grans (Abs): 0 10*3/uL (ref 0.0–0.1)
Immature Granulocytes: 0 %
Lymphocytes Absolute: 4.2 10*3/uL — ABNORMAL HIGH (ref 0.7–3.1)
Lymphs: 48 %
MCH: 31.6 pg (ref 26.6–33.0)
MCHC: 35.5 g/dL (ref 31.5–35.7)
MCV: 89 fL (ref 79–97)
Monocytes Absolute: 0.7 10*3/uL (ref 0.1–0.9)
Monocytes: 7 %
Neutrophils Absolute: 3.8 10*3/uL (ref 1.4–7.0)
Neutrophils: 43 %
Platelets: 206 10*3/uL (ref 150–450)
RBC: 4.94 x10E6/uL (ref 4.14–5.80)
RDW: 14.9 % (ref 11.6–15.4)
WBC: 8.8 10*3/uL (ref 3.4–10.8)

## 2021-06-27 LAB — CMP14+EGFR
ALT: 32 IU/L (ref 0–44)
AST: 25 IU/L (ref 0–40)
Albumin/Globulin Ratio: 1.5 (ref 1.2–2.2)
Albumin: 4.4 g/dL (ref 3.8–4.8)
Alkaline Phosphatase: 69 IU/L (ref 44–121)
BUN/Creatinine Ratio: 10 (ref 10–24)
BUN: 10 mg/dL (ref 8–27)
Bilirubin Total: 0.3 mg/dL (ref 0.0–1.2)
CO2: 28 mmol/L (ref 20–29)
Calcium: 9.7 mg/dL (ref 8.6–10.2)
Chloride: 94 mmol/L — ABNORMAL LOW (ref 96–106)
Creatinine, Ser: 1.03 mg/dL (ref 0.76–1.27)
Globulin, Total: 2.9 g/dL (ref 1.5–4.5)
Glucose: 190 mg/dL — ABNORMAL HIGH (ref 70–99)
Potassium: 4.1 mmol/L (ref 3.5–5.2)
Sodium: 137 mmol/L (ref 134–144)
Total Protein: 7.3 g/dL (ref 6.0–8.5)
eGFR: 83 mL/min/{1.73_m2} (ref >59)

## 2021-07-11 ENCOUNTER — Other Ambulatory Visit: Payer: Self-pay | Admitting: Gastroenterology

## 2021-07-11 DIAGNOSIS — I8510 Secondary esophageal varices without bleeding: Secondary | ICD-10-CM

## 2021-07-11 DIAGNOSIS — F1011 Alcohol abuse, in remission: Secondary | ICD-10-CM

## 2021-07-11 DIAGNOSIS — K766 Portal hypertension: Secondary | ICD-10-CM

## 2021-07-11 DIAGNOSIS — K7460 Unspecified cirrhosis of liver: Secondary | ICD-10-CM

## 2021-07-21 DIAGNOSIS — E1165 Type 2 diabetes mellitus with hyperglycemia: Secondary | ICD-10-CM | POA: Diagnosis not present

## 2021-07-24 LAB — HM DIABETES EYE EXAM

## 2021-08-01 ENCOUNTER — Telehealth: Payer: Medicare HMO

## 2021-08-01 NOTE — Telephone Encounter (Signed)
Will close encounter

## 2021-08-02 ENCOUNTER — Ambulatory Visit: Payer: Medicare HMO | Admitting: Family

## 2021-08-07 ENCOUNTER — Other Ambulatory Visit: Payer: Self-pay | Admitting: Family

## 2021-08-07 DIAGNOSIS — E1169 Type 2 diabetes mellitus with other specified complication: Secondary | ICD-10-CM

## 2021-08-15 ENCOUNTER — Other Ambulatory Visit: Payer: Self-pay | Admitting: Family

## 2021-08-15 DIAGNOSIS — J301 Allergic rhinitis due to pollen: Secondary | ICD-10-CM

## 2021-08-15 DIAGNOSIS — E1142 Type 2 diabetes mellitus with diabetic polyneuropathy: Secondary | ICD-10-CM

## 2021-08-15 DIAGNOSIS — Z794 Long term (current) use of insulin: Secondary | ICD-10-CM

## 2021-08-20 ENCOUNTER — Ambulatory Visit (INDEPENDENT_AMBULATORY_CARE_PROVIDER_SITE_OTHER): Payer: Medicare Other | Admitting: Gastroenterology

## 2021-08-20 ENCOUNTER — Other Ambulatory Visit: Payer: Self-pay

## 2021-08-20 ENCOUNTER — Encounter: Payer: Self-pay | Admitting: Gastroenterology

## 2021-08-20 VITALS — BP 120/79 | HR 70 | Temp 97.3°F | Ht 68.0 in | Wt 178.4 lb

## 2021-08-20 DIAGNOSIS — R1031 Right lower quadrant pain: Secondary | ICD-10-CM | POA: Diagnosis not present

## 2021-08-20 DIAGNOSIS — K7460 Unspecified cirrhosis of liver: Secondary | ICD-10-CM | POA: Diagnosis not present

## 2021-08-20 DIAGNOSIS — K219 Gastro-esophageal reflux disease without esophagitis: Secondary | ICD-10-CM

## 2021-08-20 DIAGNOSIS — R1012 Left upper quadrant pain: Secondary | ICD-10-CM | POA: Diagnosis not present

## 2021-08-20 NOTE — Progress Notes (Signed)
Primary Care Physician: Sharion Balloon, FNP  Primary Gastroenterologist:  Garfield Cornea, MD   Chief Complaint  Patient presents with   Cirrhosis    F/u. Stools about 3 times per day   Abdominal Pain    Right side    HPI: Thomas Pratt is a 61 y.o. male here for follow-up.  Patient last seen in July 2022.  History of cirrhosis presumably due to prior alcohol abuse (none since September 2017) however, he had positive AMA, AMA, ASMA (on repeat negative), elevated IgG, liver biopsy with granulomatous hepatitis.  Evaluated by [facility] liver transplant center.  Labs repeated and they reviewed liver biopsy slides and suspected cirrhosis secondary to alcohol use but did have hepatic sarcoidosis.  Pulmonary work-up negative for lung involvement.  Person was stopped by [facility] liver.  Advised to complete 6 months of substance abuse counseling and return if liver disease decompensates.  Colonoscopy up-to-date, 2018.  History of tubular adenoma, due for colonoscopy in August of this year.  In June 2021 he was hospitalized for acute GI bleed, CT abdomen pelvis was normal.  He was treated with octreotide and IV Protonix.  EGD updated on December 23, 2019 with an innocent appearing and fairly grade 2 esophageal varices mild portal hypertensive gastropathy, no evidence of bleeding from the upper GI tract and recommended he continue current medication.  He has been on nadolol 40 mg daily, Xifaxan 500 mg twice daily and low-dose diuretics.  He is due for labs and ultrasound for hepatoma screening.  Last ultrasound in August 2022 consistent with cirrhosis and no focal liver lesion.  His meld was 6.  Hepatitis B surface antibody showed lack of immunity.  We requested he received 3 shot series for hepatitis B vaccine, patient cannot remember whether he ever had this done in the past.  Labs for another provider completed June 26, 2021.  White blood cell count 8800, hemoglobin 15.6, platelets 206,000, glucose  198, BUN 10, creatinine 1.03, sodium 137, albumin 4.4, total bilirubin 0.3, alkaline phosphatase 69, AST 25, ALT 32.  Potassium 4.1.  Today: states he has been having increased RLQ pain for the past one week. Pain can be intense at times, 10/10. Mild pain all the time, throbs. Says it feels like before he had his right inguinal hernia repair. Pain at scar and just below. Hurts to move. Hurts more when urinates. No dysuria. No fever, vomiting. BMs 2-3 per day. No melena, brbpr. No heartburn. Also with chronic LUQ pain. Worse with meals but mild and can "handle this pain". Feet with some swelling. Feel spongy when first gets up to walk. States he completed Hep B vaccines at [facility] in [place]. Continue to go to etoh rehab, both AA and another place he set up at the direction of [facility].     Has been sober since 2017.    Current Outpatient Medications  Medication Sig Dispense Refill   Accu-Chek Softclix Lancets lancets Test BS 4 (four) times daily. Dx E11.69 (Patient taking differently: 1 each by Other route 4 (four) times daily as needed. Test BS 4 (four) times daily. Dx E11.69) 400 each 3   atorvastatin (LIPITOR) 10 MG tablet Take 1 tablet (10 mg total) by mouth daily. 90 tablet 2   Blood Glucose Monitoring Suppl (ACCU-CHEK GUIDE) w/Device KIT Test BS 4 (four) times daily. Dx E11.69 1 kit 0   cetirizine (ZYRTEC) 10 MG tablet Take 1 tablet by mouth once daily 90 tablet 1  Continuous Blood Gluc Sensor (FREESTYLE LIBRE 2 SENSOR) MISC Use to test blood sugars 6x daily as directed 2 each 3   dexlansoprazole (DEXILANT) 60 MG capsule Take 1 capsule (60 mg total) by mouth daily. 90 capsule 0   escitalopram (LEXAPRO) 10 MG tablet Take 1 tablet (10 mg total) by mouth daily. 90 tablet 0   FARXIGA 10 MG TABS tablet TAKE 1 TABLET BY MOUTH ONCE DAILY BEFORE BREAKFAST 90 tablet 1   fluticasone (FLONASE) 50 MCG/ACT nasal spray Use 2 spray(s) in each nostril once daily 16 g 5   furosemide (LASIX) 20 MG tablet  Take 1 tablet (20 mg total) by mouth daily. 90 tablet 3   gabapentin (NEURONTIN) 600 MG tablet TAKE 1 TABLET BY MOUTH THREE TIMES DAILY 90 tablet 1   glucose blood (ACCU-CHEK GUIDE) test strip Test BS 4 (four) times daily. Dx E11.69 (Patient taking differently: 1 each by Other route 4 (four) times daily as needed. Test BS 4 (four) times daily. Dx E11.69) 400 each 3   Insulin Pen Needle (B-D UF III MINI PEN NEEDLES) 31G X 5 MM MISC USE WITH TRESIBA DAILY 100 each 3   metFORMIN (GLUCOPHAGE-XR) 750 MG 24 hr tablet TAKE 2 TABLETS BY MOUTH ONCE DAILY WITH BREAKFAST 180 tablet 0   nadolol (CORGARD) 40 MG tablet Take 1 tablet by mouth once daily 90 tablet 3   nystatin cream (MYCOSTATIN) Apply 1 application topically 2 (two) times daily. 30 g 2   rifaximin (XIFAXAN) 550 MG TABS tablet Take 1 tablet (550 mg total) by mouth 2 (two) times daily. 180 tablet 3   spironolactone (ALDACTONE) 50 MG tablet Take 1 tablet (50 mg total) by mouth daily. 90 tablet 0   TRESIBA FLEXTOUCH 100 UNIT/ML FlexTouch Pen INJECT 25 UNITS SUBCUTANEOUSLY AT BEDTIME 15 mL 0   VITAMIN D PO Take 1 capsule by mouth daily.      No current facility-administered medications for this visit.    Allergies as of 08/20/2021   (No Known Allergies)   Past Medical History:  Diagnosis Date   Cirrhosis ([place])    likely ETOH use    Diabetes ([place])    Heat stroke 2016/17   Past Surgical History:  Procedure Laterality Date   CHOLECYSTECTOMY N/A 09/01/2017   Procedure: LAPAROSCOPIC CHOLECYSTECTOMY;  Surgeon: Aviva Signs, MD;  Location: [facility];  Service: General;  Laterality: N/A;   COLONOSCOPY WITH PROPOFOL N/A 03/06/2017   Dr. Gala Romney: tubular adenoma removed, next TCS 5 years if health permits.    ESOPHAGOGASTRODUODENOSCOPY (EGD) WITH PROPOFOL N/A 03/06/2017   Dr. Gala Romney: grade 1 esophageal varices, erosive esophagitis, portal gastropathy.    ESOPHAGOGASTRODUODENOSCOPY (EGD) WITH PROPOFOL N/A 05/03/2019   Procedure: ESOPHAGOGASTRODUODENOSCOPY  (EGD) WITH PROPOFOL;  Surgeon: Daneil Dolin, MD;  Location: [facility];  Service: Endoscopy;  Laterality: N/A;  10:30am   ESOPHAGOGASTRODUODENOSCOPY (EGD) WITH PROPOFOL N/A 12/23/2019   Procedure: ESOPHAGOGASTRODUODENOSCOPY (EGD) WITH PROPOFOL Possible esophageal variceal banding.;  Surgeon: Daneil Dolin, MD;  Location: [facility];  Service: Endoscopy;  Laterality: N/A;   HERNIA REPAIR Right    RIH   INGUINAL HERNIA REPAIR Right 07/11/2017   Procedure: HERNIA REPAIR INGUINAL ADULT WITH MESH;  Surgeon: Aviva Signs, MD;  Location: [facility];  Service: General;  Laterality: Right;  patient knows to arrive at 7:30   IR TRANSCATHETER BX  10/14/2017   IR US GUIDE VASC ACCESS RIGHT  10/14/2017   IR VENOGRAM HEPATIC WO HEMODYNAMIC EVALUATION  10/14/2017  MALONEY DILATION N/A 05/03/2019   Procedure: Venia Minks DILATION;  Surgeon: Daneil Dolin, MD;  Location: [facility];  Service: Endoscopy;  Laterality: N/A;   POLYPECTOMY  03/06/2017   Procedure: POLYPECTOMY;  Surgeon: Daneil Dolin, MD;  Location: [facility];  Service: Endoscopy;;  ascending colon;   Family History  Problem Relation Age of Onset   COPD Mother    Liver disease Neg Hx    Colon cancer Neg Hx    Colon polyps Neg Hx    Social History   Tobacco Use   Smoking status: Never   Smokeless tobacco: Never  Vaping Use   Vaping Use: Never used  Substance Use Topics   Alcohol use: No    Comment: Quit September 2017, heavy alcohol abuse previously   Drug use: No    ROS:  General: Negative for anorexia, weight loss, fever, chills, fatigue, weakness. ENT: Negative for hoarseness, difficulty swallowing , nasal congestion. CV: Negative for chest pain, angina, palpitations, dyspnea on exertion, +peripheral edema.  Respiratory: Negative for dyspnea at rest, dyspnea on exertion, cough, sputum, wheezing.  GI: See history of present illness. GU:  Negative for dysuria, hematuria, urinary incontinence, urinary frequency,  nocturnal urination.  Endo: Negative for unusual weight change.    Physical Examination:   BP 120/79    Pulse 70    Temp (!) 97.3 F (36.3 C)    Ht '5\' 8"'  (1.727 m)    Wt 178 lb 6.4 oz (80.9 kg)    BMI 27.13 kg/m   General: Well-nourished, well-developed in no acute distress.  Eyes: No icterus. Mouth: masked Lungs: Clear to auscultation bilaterally.  Heart: Regular rate and rhythm, no murmurs rubs or gallops.  Abdomen: Bowel sounds are normal,  nondistended, no hepatosplenomegaly or masses, no abdominal bruits. no rebound or guarding.  Tender in RLQ at site of incision from inguinal hernia repair. Tender below this area. No noted bulge. Diffuse mild tenderness throughout the abdomen but again more focal spot to left of the umbilicus. ?tiny umb hernia.  Extremities: trace bilateral pedal edema. No clubbing or deformities. Neuro: Alert and oriented x 4   Skin: Warm and dry, no jaundice.   Psych: Alert and cooperative, normal mood and affect.  Labs:  Lab Results  Component Value Date   CREATININE 1.03 06/26/2021   BUN 10 06/26/2021   NA 137 06/26/2021   K 4.1 06/26/2021   CL 94 (L) 06/26/2021   CO2 28 06/26/2021   Lab Results  Component Value Date   ALT 32 06/26/2021   AST 25 06/26/2021   ALKPHOS 69 06/26/2021   BILITOT 0.3 06/26/2021   Lab Results  Component Value Date   WBC 8.8 06/26/2021   HGB 15.6 06/26/2021   HCT 44.0 06/26/2021   MCV 89 06/26/2021   PLT 206 06/26/2021   Lab Results  Component Value Date   INR 1.0 02/19/2021   INR 1.0 08/23/2020   INR 1.1 02/16/2020     Imaging Studies: No results found.   Assessment:  Cirrhosis: With history of prior alcohol abuse but sober since 2017.  Granulomatous hepatitis on prior liver biopsy, positive ANA/AMA/IgG.  Complicated by portal hypertension, grade 2 esophageal varices.  Has been maintained on nadolol 40 mg daily, Xifaxan 500 mg twice daily, furosemide 20 mg daily, spironolactone 50 mg daily.  Overall has  been well compensated.  Has seen [facility] liver transplant center in the past advised to return if decompensation.  Patient reports going to  alcohol rehab meetings on a regular basis.  States he has completed hepatitis B vaccinations.  He is due for labs and hepatoma screening at this time.  Abdominal pain: He has history of chronic intermittent abdominal pain.  In the last 5 years he has had his gallbladder removed, right inguinal hernia repair.  Complains of severe pain at times in the right lower quadrant at site of his prior inguinal hernia repair (2018), worse over the past week.  Sometimes the pain is more bearable like today.  No fever.  Hurts with movement or trying to urinate.  Denies dysuria or hematuria.  Last seen by general surgery in early 2020 and at that time it was felt that no evidence of recurrent hernia or other issues.  He also has chronic left upper quadrant pain worse with meals but more tolerable.  Discussed with patient today, plan for CT abdomen pelvis. Need to exclude appendicitis, recurrent right inguinal hernia (less likely), symptoms may be secondary to adhesions. Will also screen for hepatoma at same time. If CT negative, will pursue surveillance colonoscopy next as he is due this year.  GERD: Doing well on PPI.  History of tubular adenoma of the colon: Due for colonoscopy this year.  We will arrange after work-up of right lower quadrant pain.   Plan: Labs and CT A/P with contrast as planned.  He will continue nadolol, spironolactone, furosemide, rifaximin, Dexilant as before. Colonoscopy in the next few months. Patient aware that if his symptoms worsen, develops fever, vomiting, he should go to the emergency department.  If he has no ride, this would be considered an emergency and 911 would be appropriate.

## 2021-08-20 NOTE — Patient Instructions (Addendum)
Please have labs done at the hospital at the same time the CT scan is done. CT scan of your abdomen as planned. If you pain worsens, you develop fever, vomiting, go the the ER. Continue your medications as before. You will be due for a colonoscopy later this year, we will consider once CT completed, especially if not findings to explain your pain.

## 2021-08-21 DIAGNOSIS — E1165 Type 2 diabetes mellitus with hyperglycemia: Secondary | ICD-10-CM | POA: Diagnosis not present

## 2021-08-23 ENCOUNTER — Other Ambulatory Visit (HOSPITAL_COMMUNITY)
Admission: RE | Admit: 2021-08-23 | Discharge: 2021-08-23 | Disposition: A | Discharge status: Home / Self Care | Payer: Medicare Other | Source: Ambulatory Visit | Attending: Gastroenterology | Admitting: Gastroenterology

## 2021-08-23 ENCOUNTER — Ambulatory Visit (HOSPITAL_COMMUNITY)
Admission: RE | Admit: 2021-08-23 | Discharge: 2021-08-23 | Disposition: A | Discharge status: Home / Self Care | Payer: Medicare Other | Source: Ambulatory Visit | Attending: Gastroenterology | Admitting: Gastroenterology

## 2021-08-23 ENCOUNTER — Other Ambulatory Visit: Payer: Self-pay

## 2021-08-23 DIAGNOSIS — R1031 Right lower quadrant pain: Secondary | ICD-10-CM | POA: Insufficient documentation

## 2021-08-23 DIAGNOSIS — K766 Portal hypertension: Secondary | ICD-10-CM | POA: Insufficient documentation

## 2021-08-23 DIAGNOSIS — K219 Gastro-esophageal reflux disease without esophagitis: Secondary | ICD-10-CM | POA: Insufficient documentation

## 2021-08-23 DIAGNOSIS — I700 Atherosclerosis of aorta: Secondary | ICD-10-CM | POA: Diagnosis not present

## 2021-08-23 DIAGNOSIS — K429 Umbilical hernia without obstruction or gangrene: Secondary | ICD-10-CM | POA: Diagnosis not present

## 2021-08-23 DIAGNOSIS — R1012 Left upper quadrant pain: Secondary | ICD-10-CM | POA: Insufficient documentation

## 2021-08-23 DIAGNOSIS — K7460 Unspecified cirrhosis of liver: Secondary | ICD-10-CM | POA: Insufficient documentation

## 2021-08-23 LAB — CBC WITH DIFFERENTIAL/PLATELET
Abs Immature Granulocytes: 0.02 10*3/uL (ref 0.00–0.07)
Basophils Absolute: 0 10*3/uL (ref 0.0–0.1)
Basophils Relative: 0 %
Eosinophils Absolute: 0.1 10*3/uL (ref 0.0–0.5)
Eosinophils Relative: 1 %
HCT: 50.4 % (ref 39.0–52.0)
Hemoglobin: 17.4 g/dL — ABNORMAL HIGH (ref 13.0–17.0)
Immature Granulocytes: 0 %
Lymphocytes Relative: 36 %
Lymphs Abs: 3 10*3/uL (ref 0.7–4.0)
MCH: 33.2 pg (ref 26.0–34.0)
MCHC: 34.5 g/dL (ref 30.0–36.0)
MCV: 96.2 fL (ref 80.0–100.0)
Monocytes Absolute: 0.6 10*3/uL (ref 0.1–1.0)
Monocytes Relative: 7 %
Neutro Abs: 4.7 10*3/uL (ref 1.7–7.7)
Neutrophils Relative %: 56 %
Platelets: 232 10*3/uL (ref 150–400)
RBC: 5.24 MIL/uL (ref 4.22–5.81)
RDW: 14.2 % (ref 11.5–15.5)
WBC: 8.4 10*3/uL (ref 4.0–10.5)
nRBC: 0 % (ref 0.0–0.2)

## 2021-08-23 LAB — COMPREHENSIVE METABOLIC PANEL
ALT: 27 U/L (ref 0–44)
AST: 26 U/L (ref 15–41)
Albumin: 4.7 g/dL (ref 3.5–5.0)
Alkaline Phosphatase: 59 U/L (ref 38–126)
Anion gap: 9 (ref 5–15)
BUN: 21 mg/dL (ref 8–23)
CO2: 29 mmol/L (ref 22–32)
Calcium: 10 mg/dL (ref 8.9–10.3)
Chloride: 95 mmol/L — ABNORMAL LOW (ref 98–111)
Creatinine, Ser: 1.09 mg/dL (ref 0.61–1.24)
GFR, Estimated: >60 mL/min (ref >60)
Glucose, Bld: 79 mg/dL (ref 70–99)
Potassium: 4.3 mmol/L (ref 3.5–5.1)
Sodium: 133 mmol/L — ABNORMAL LOW (ref 135–145)
Total Bilirubin: 1 mg/dL (ref 0.3–1.2)
Total Protein: 8.6 g/dL — ABNORMAL HIGH (ref 6.5–8.1)

## 2021-08-23 LAB — PROTIME-INR
INR: 1 (ref 0.8–1.2)
Prothrombin Time: 13.4 seconds (ref 11.4–15.2)

## 2021-08-23 LAB — POCT I-STAT CREATININE: Creatinine, Ser: 1.2 mg/dL (ref 0.61–1.24)

## 2021-08-23 LAB — LIPASE, BLOOD: Lipase: 57 U/L — ABNORMAL HIGH (ref 11–51)

## 2021-08-23 MED ORDER — IOHEXOL 300 MG/ML  SOLN
100.0000 mL | Freq: Once | INTRAMUSCULAR | Status: AC | PRN
  Administered 2021-08-23: 100 mL via INTRAVENOUS

## 2021-08-23 MED ORDER — IOHEXOL 9 MG/ML PO SOLN
ORAL | Status: AC
  Filled 2021-08-23: qty 1000

## 2021-08-24 LAB — AFP TUMOR MARKER: AFP, Serum, Tumor Marker: <1.8 ng/mL (ref 0.0–8.4)

## 2021-08-24 LAB — HEPATITIS B SURFACE ANTIBODY, QUANTITATIVE: Hep B S AB Quant (Post): 16.4 m[IU]/mL (ref >9.9)

## 2021-08-28 ENCOUNTER — Other Ambulatory Visit: Payer: Self-pay | Admitting: Family

## 2021-08-28 DIAGNOSIS — E1169 Type 2 diabetes mellitus with other specified complication: Secondary | ICD-10-CM

## 2021-09-03 ENCOUNTER — Telehealth: Payer: Self-pay | Admitting: Internal Medicine

## 2021-09-03 NOTE — Telephone Encounter (Signed)
Pt was following up on his results from 08/23/2021, [id number]

## 2021-09-04 NOTE — Telephone Encounter (Signed)
See results note. 

## 2021-09-11 ENCOUNTER — Other Ambulatory Visit: Payer: Self-pay | Admitting: Nurse Practitioner

## 2021-09-11 DIAGNOSIS — K7460 Unspecified cirrhosis of liver: Secondary | ICD-10-CM

## 2021-09-11 DIAGNOSIS — I8510 Secondary esophageal varices without bleeding: Secondary | ICD-10-CM

## 2021-09-11 DIAGNOSIS — F1011 Alcohol abuse, in remission: Secondary | ICD-10-CM

## 2021-09-11 DIAGNOSIS — K766 Portal hypertension: Secondary | ICD-10-CM

## 2021-09-21 DIAGNOSIS — E1165 Type 2 diabetes mellitus with hyperglycemia: Secondary | ICD-10-CM | POA: Diagnosis not present

## 2021-10-16 ENCOUNTER — Encounter: Payer: Self-pay | Admitting: Nurse Practitioner

## 2021-10-16 ENCOUNTER — Ambulatory Visit (INDEPENDENT_AMBULATORY_CARE_PROVIDER_SITE_OTHER): Payer: Medicare Other | Admitting: Nurse Practitioner

## 2021-10-16 VITALS — BP 105/70 | HR 62 | Temp 98.5°F | Ht 68.0 in | Wt 176.0 lb

## 2021-10-16 DIAGNOSIS — R110 Nausea: Secondary | ICD-10-CM | POA: Insufficient documentation

## 2021-10-16 DIAGNOSIS — M79604 Pain in right leg: Secondary | ICD-10-CM

## 2021-10-16 DIAGNOSIS — M79605 Pain in left leg: Secondary | ICD-10-CM

## 2021-10-16 DIAGNOSIS — I10 Essential (primary) hypertension: Secondary | ICD-10-CM

## 2021-10-16 DIAGNOSIS — R1084 Generalized abdominal pain: Secondary | ICD-10-CM | POA: Diagnosis not present

## 2021-10-16 MED ORDER — SPIRONOLACTONE 50 MG PO TABS: 50.0000 mg | ORAL_TABLET | Freq: Every day | ORAL | 0 refills | Status: DC

## 2021-10-16 MED ORDER — ONDANSETRON HCL 4 MG PO TABS: 4.0000 mg | ORAL_TABLET | Freq: Three times a day (TID) | ORAL | 0 refills | Status: DC | PRN

## 2021-10-16 MED ORDER — DICLOFENAC SODIUM 1 % EX GEL: 2.0000 g | Freq: Four times a day (QID) | CUTANEOUS | 0 refills | Status: DC

## 2021-10-16 NOTE — Patient Instructions (Addendum)
Peripheral Neuropathy ?Peripheral neuropathy is a type of nerve damage. It affects nerves that carry signals between the spinal cord and the arms, legs, and the rest of the body (peripheral nerves). It does not affect nerves in the spinal cord or brain. In peripheral neuropathy, one nerve or a group of nerves may be damaged. Peripheral neuropathy is a broad category that includes many specific nerve disorders, like diabetic neuropathy, hereditary neuropathy, and carpal tunnel syndrome. ?What are the causes? ?This condition may be caused by: ?Diabetes. This is the most common cause of peripheral neuropathy. ?Nerve injury. ?Pressure or stress on a nerve that lasts a long time. ?Lack (deficiency) of B vitamins. This can result from alcoholism, poor diet, or a restricted diet. ?Infections. ?Autoimmune diseases, such as rheumatoid arthritis and systemic lupus erythematosus. ?Nerve diseases that are passed from parent to child (inherited). ?Some medicines, such as cancer medicines (chemotherapy). ?Poisonous (toxic) substances, such as lead and mercury. ?Too little blood flowing to the legs. ?Kidney disease. ?Thyroid disease. ?In some cases, the cause of this condition is not known. ?What are the signs or symptoms? ?Symptoms of this condition depend on which of your nerves is damaged. Common symptoms include: ?Loss of feeling (numbness) in the feet, hands, or both. ?Tingling in the feet, hands, or both. ?Burning pain. ?Very sensitive skin. ?Weakness. ?Not being able to move a part of the body (paralysis). ?Muscle twitching. ?Clumsiness or poor coordination. ?Loss of balance. ?Not being able to control your bladder. ?Feeling dizzy. ?Sexual problems. ?How is this diagnosed? ?Diagnosing and finding the cause of peripheral neuropathy can be difficult. Your health care provider will take your medical history and do a physical exam. A neurological exam will also be done. This involves checking things that are affected by your  brain, spinal cord, and nerves (nervous system). For example, your health care provider will check your reflexes, how you move, and what you can feel. ?You may have other tests, such as: ?Blood tests. ?Electromyogram (EMG) and nerve conduction tests. These tests check nerve function and how well the nerves are controlling the muscles. ?Imaging tests, such as CT scans or MRI to rule out other causes of your symptoms. ?Removing a small piece of nerve to be examined in a lab (nerve biopsy). ?Removing and examining a small amount of the fluid that surrounds the brain and spinal cord (lumbar puncture). ?How is this treated? ?Treatment for this condition may involve: ?Treating the underlying cause of the neuropathy, such as diabetes, kidney disease, or vitamin deficiencies. ?Stopping medicines that can cause neuropathy, such as chemotherapy. ?Medicine to help relieve pain. Medicines may include: ?Prescription or over-the-counter pain medicine. ?Antiseizure medicine. ?Antidepressants. ?Pain-relieving patches that are applied to painful areas of skin. ?Surgery to relieve pressure on a nerve or to destroy a nerve that is causing pain. ?Physical therapy to help improve movement and balance. ?Devices to help you move around (assistive devices). ?Follow these instructions at home: ?Medicines ?Take over-the-counter and prescription medicines only as told by your health care provider. Do not take any other medicines without first asking your health care provider. ?Do not drive or use heavy machinery while taking prescription pain medicine. ?Lifestyle ? ?Do not use any products that contain nicotine or tobacco, such as cigarettes and e-cigarettes. Smoking keeps blood from reaching damaged nerves. If you need help quitting, ask your health care provider. ?Avoid or limit alcohol. Too much alcohol can cause a vitamin B deficiency, and vitamin B is needed for healthy nerves. ?Eat a   healthy diet. This includes: ?Eating foods that are  high in fiber, such as fresh fruits and vegetables, whole grains, and beans. ?Limiting foods that are high in fat and processed sugars, such as fried or sweet foods. ?General instructions ? ?If you have diabetes, work closely with your health care provider to keep your blood sugar under control. ?If you have numbness in your feet: ?Check every day for signs of injury or infection. Watch for redness, warmth, and swelling. ?Wear padded socks and comfortable shoes. These help protect your feet. ?Develop a good support system. Living with peripheral neuropathy can be stressful. Consider talking with a mental health specialist or joining a support group. ?Use assistive devices and attend physical therapy as told by your health care provider. This may include using a walker or a cane. ?Keep all follow-up visits as told by your health care provider. This is important. ?Contact a health care provider if: ?You have new signs or symptoms of peripheral neuropathy. ?You are struggling emotionally from dealing with peripheral neuropathy. ?Your pain is not well-controlled. ?Get help right away if: ?You have an injury or infection that is not healing normally. ?You develop new weakness in an arm or leg. ?You have fallen or do so frequently. ?Summary ?Peripheral neuropathy is when the nerves in the arms, or legs are damaged, resulting in numbness, weakness, or pain. ?There are many causes of peripheral neuropathy, including diabetes, pinched nerves, vitamin deficiencies, autoimmune disease, and hereditary conditions. ?Diagnosing and finding the cause of peripheral neuropathy can be difficult. Your health care provider will take your medical history, do a physical exam, and do tests, including blood tests and nerve function tests. ?Treatment involves treating the underlying cause of the neuropathy and taking medicines to help control pain. Physical therapy and assistive devices may also help. ?This information is not intended to  replace advice given to you by your health care provider. Make sure you discuss any questions you have with your health care provider. ?Document Revised: 04/18/2020 Document Reviewed: 04/18/2020 ?Elsevier Patient Education ? [place]. ?Leg Cramps ?Leg cramps occur when one or more muscles tighten and a person has no control over it (involuntary muscle contraction). Muscle cramps are most common in the calf muscles of the leg. They can occur during exercise or at rest. Leg cramps are painful, and they may last for a few seconds to a few minutes. Cramps may return several times before they finally stop. ?Usually, leg cramps are not caused by a serious medical problem. In many cases, the cause is not known. Some common causes include: ?Excessive physical effort (overexertion), such as during intense exercise. ?Doing the same motion over and over. ?Staying in a certain position for a long period of time. ?Improper preparation, form, or technique while doing a sport or an activity. ?Dehydration. ?Injury. ?Side effects of certain medicines. ?Abnormally low levels of minerals in your blood (electrolytes), especially potassium and calcium. This could result from: ?Pregnancy. ?Taking diuretic medicines. ?Follow these instructions at home: ?Eating and drinking ?Drink enough fluid to keep your urine pale yellow. Staying hydrated may help prevent cramps. ?Eat a healthy diet that includes plenty of nutrients to help your muscles function. A healthy diet includes fruits and vegetables, lean protein, whole grains, and low-fat or nonfat dairy products. ?Managing pain, stiffness, and swelling ?  ?Try massaging, stretching, and relaxing the affected muscle. Do this for several minutes at a time. ?If directed, put ice on areas that are sore or painful after a  cramp. To do this: ?Put ice in a plastic bag. ?Place a towel between your skin and the bag. ?Leave the ice on for 20 minutes, 2-3 times a day. ?Remove the ice if your  skin turns bright red. This is very important. If you cannot feel pain, heat, or cold, you have a greater risk of damage to the area. ?If directed, apply heat to muscles that are tense or tight. Do this befor

## 2021-10-16 NOTE — Assessment & Plan Note (Signed)
Refill medication sent to pharmacy.  Patient continue on same dose.  Hypertension is well controlled on current medication no changes necessary. ?

## 2021-10-16 NOTE — Progress Notes (Signed)
? ?Acute Office Visit ? ?Subjective:  ? ? Patient ID: Thomas Thomas, male    DOB: January 17, 1961, 61 y.o.   MRN: [id number] ? ?Chief Complaint  ?Patient presents with  ? Abdominal Pain  ?  Concern for bilateral foot pain  ? ? ?Leg Pain  ?The incident occurred 3 to 5 days ago. The incident occurred at home. There was no injury mechanism. The pain is present in the right foot and left foot. The quality of the pain is described as aching. The pain is at a severity of 4/10. Associated symptoms include tingling. He reports no foreign bodies present. He has tried nothing for the symptoms.  ?Abdominal Pain ?This is a new problem. The current episode started yesterday. The onset quality is sudden. The problem occurs constantly. The problem has been gradually worsening. The pain is located in the LLQ. The pain is at a severity of 9/10. The pain is severe. The quality of the pain is aching. Pain radiation: Left flank. Associated symptoms include nausea. Pertinent negatives include no anorexia, belching, fever or headaches. Nothing aggravates the pain. The pain is relieved by Nothing.  ? ?Past Medical History:  ?Diagnosis Date  ? Cirrhosis ([place])   ? likely ETOH use   ? Diabetes ([place])   ? Heat stroke 2016/17  ? ? ?Past Surgical History:  ?Procedure Laterality Date  ? CHOLECYSTECTOMY N/A 09/01/2017  ? Procedure: LAPAROSCOPIC CHOLECYSTECTOMY;  Surgeon: Aviva Signs, MD;  Location: [facility];  Service: General;  Laterality: N/A;  ? COLONOSCOPY WITH PROPOFOL N/A 03/06/2017  ? Dr. Gala Romney: tubular adenoma removed, next TCS 5 years if health permits.   ? ESOPHAGOGASTRODUODENOSCOPY (EGD) WITH PROPOFOL N/A 03/06/2017  ? Dr. Gala Romney: grade 1 esophageal varices, erosive esophagitis, portal gastropathy.   ? ESOPHAGOGASTRODUODENOSCOPY (EGD) WITH PROPOFOL N/A 05/03/2019  ? Procedure: ESOPHAGOGASTRODUODENOSCOPY (EGD) WITH PROPOFOL;  Surgeon: Daneil Dolin, MD;  Location: [facility];  Service: Endoscopy;  Laterality: N/A;  10:30am  ?  ESOPHAGOGASTRODUODENOSCOPY (EGD) WITH PROPOFOL N/A 12/23/2019  ? Procedure: ESOPHAGOGASTRODUODENOSCOPY (EGD) WITH PROPOFOL Possible esophageal variceal banding.;  Surgeon: Daneil Dolin, MD;  [facility]: [facility];  Service: Endoscopy;  Laterality: N/A;  ? HERNIA REPAIR Right   ? RIH  ? INGUINAL HERNIA REPAIR Right 07/11/2017  ? Procedure: HERNIA REPAIR INGUINAL ADULT WITH MESH;  Surgeon: Aviva Signs, MD;  Location: [facility];  Service: General;  Laterality: Right;  patient knows to arrive at 7:30  ? IR TRANSCATHETER BX  10/14/2017  ? IR US GUIDE VASC ACCESS RIGHT  10/14/2017  ? IR VENOGRAM HEPATIC WO HEMODYNAMIC EVALUATION  10/14/2017  ? MALONEY DILATION N/A 05/03/2019  ? Procedure: MALONEY DILATION;  Surgeon: Daneil Dolin, MD;  [facility]: [facility];  Service: Endoscopy;  Laterality: N/A;  ? POLYPECTOMY  03/06/2017  ? Procedure: POLYPECTOMY;  Surgeon: Daneil Dolin, MD;  Location: [facility];  Service: Endoscopy;;  ascending colon;  ? ? ?Family History  ?Problem Relation Age of Onset  ? COPD Mother   ? Liver disease Neg Hx   ? Colon cancer Neg Hx   ? Colon polyps Neg Hx   ? ? ?Social History  ? ?Socioeconomic History  ? Marital status: Divorced  ?  Spouse name: Not on file  ? Number of children: Not on file  ? Years of education: Not on file  ? Highest education level: Not on file  ?Occupational History  ? Occupation: unemployed  ?  Comment: disability  ?Tobacco Use  ?  Smoking status: Never  ? Smokeless tobacco: Never  ?Vaping Use  ? Vaping Use: Never used  ?Substance and Sexual Activity  ? Alcohol use: No  ?  Comment: Quit September 2017, heavy alcohol abuse previously  ? Drug use: No  ? Sexual activity: Not Currently  ?  Partners: Female  ?Other Topics Concern  ? Not on file  ?Social History Narrative  ? Lives in [place], [place] with his youngest daughter; 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   ? Drank alcohol for years, quit 05/07/16. Has been sober since then. Quit b/c of health/cirrhosis.   ? Divorced for over 15 years.  ? Dating sporadically.  ? Has 3 daughters and 1 son live locally.  ? Originally from [place], moved here b/c of wife.   ? Eats all foods.   ? Wear seatbelt.  ? Does not drive, does not have a license, lost due to DWI.   ? Brought by neighbor.   ? Does not go to church. Believes in God. Prays each night, per report.   ? ?Social Determinants of Health  ? ?Financial Resource Strain: Low Risk   ? Difficulty of Paying Living Expenses: Not hard at all  ?Food Insecurity: No Food Insecurity  ? Worried About Charity fundraiser in the Last Year: Never true  ? Ran Out of Food in the Last Year: Never true  ?Transportation Needs: No Transportation Needs  ? Lack of Transportation (Medical): No  ? Lack of Transportation (Non-Medical): No  ?Physical Activity: Sufficiently Active  ? Days of Exercise per Week: 7 days  ? Minutes of Exercise per Session: 60 min  ?Stress: No Stress Concern Present  ? Feeling of Stress : Only a little  ?Social Connections: Socially Isolated  ? Frequency of Communication with Friends and Family: More than three times a week  ? Frequency of Social Gatherings with Friends and Family: More than three times a week  ? Attends Religious Services: Never  ? Active Member of Clubs or Organizations: No  ? Attends Archivist Meetings: Never  ? Marital Status: Divorced  ?Intimate Partner Violence: Not At Risk  ? Fear of Current or Ex-Partner: No  ? Emotionally Abused: No  ? Physically Abused: No  ? Sexually Abused: No  ? ? ?Outpatient Medications Prior to Visit  ?Medication Sig Dispense Refill  ? Accu-Chek Softclix Lancets lancets Test BS 4 (four) times daily. Dx E11.69 (Patient taking differently: 1 each by Other route 4 (four) times daily as needed. Test BS 4 (four) times daily. Dx E11.69) 400 each 3  ? atorvastatin (LIPITOR) 10 MG tablet Take 1 tablet (10 mg total) by mouth daily. 90 tablet 2  ? Blood Glucose Monitoring Suppl (ACCU-CHEK GUIDE) w/Device KIT Test BS 4 (four)  times daily. Dx E11.69 1 kit 0  ? cetirizine (ZYRTEC) 10 MG tablet Take 1 tablet by mouth once daily 90 tablet 1  ? Continuous Blood Gluc Sensor (FREESTYLE LIBRE 2 SENSOR) MISC Use to test blood sugars 6x daily as directed 2 each 3  ? dexlansoprazole (DEXILANT) 60 MG capsule Take 1 capsule (60 mg total) by mouth daily. 90 capsule 0  ? escitalopram (LEXAPRO) 10 MG tablet Take 1 tablet (10 mg total) by mouth daily. 90 tablet 0  ? FARXIGA 10 MG TABS tablet TAKE 1 TABLET BY MOUTH ONCE DAILY BEFORE BREAKFAST 90 tablet 1  ? fluticasone (FLONASE) 50 MCG/ACT nasal spray Use 2 spray(s) in each nostril once daily 16  g 5  ? furosemide (LASIX) 20 MG tablet Take 1 tablet by mouth once daily 90 tablet 3  ? gabapentin (NEURONTIN) 600 MG tablet TAKE 1 TABLET BY MOUTH THREE TIMES DAILY 90 tablet 1  ? glucose blood (ACCU-CHEK GUIDE) test strip Test BS 4 (four) times daily. Dx E11.69 (Patient taking differently: 1 each by Other route 4 (four) times daily as needed. Test BS 4 (four) times daily. Dx E11.69) 400 each 3  ? Insulin Pen Needle (B-D UF III MINI PEN NEEDLES) 31G X 5 MM MISC USE WITH TRESIBA DAILY 100 each 3  ? metFORMIN (GLUCOPHAGE-XR) 750 MG 24 hr tablet TAKE 2 TABLETS BY MOUTH ONCE DAILY WITH BREAKFAST 180 tablet 0  ? nadolol (CORGARD) 40 MG tablet Take 1 tablet by mouth once daily 90 tablet 3  ? nystatin cream (MYCOSTATIN) Apply 1 application topically 2 (two) times daily. 30 g 2  ? rifaximin (XIFAXAN) 550 MG TABS tablet Take 1 tablet (550 mg total) by mouth 2 (two) times daily. 180 tablet 3  ? TRESIBA FLEXTOUCH 100 UNIT/ML FlexTouch Pen INJECT 25 UNITS SUBCUTANEOUSLY AT BEDTIME 15 mL 0  ? VITAMIN D PO Take 1 capsule by mouth daily.     ? spironolactone (ALDACTONE) 50 MG tablet Take 1 tablet (50 mg total) by mouth daily. 90 tablet 0  ? ?No facility-administered medications prior to visit.  ? ? ?No Known Allergies ? ?Review of Systems  ?Constitutional: Negative.  Negative for fever.  ?HENT: Negative.    ?Eyes: Negative.    ?Respiratory: Negative.    ?Cardiovascular:  Negative for leg swelling.  ?Gastrointestinal:  Positive for abdominal pain and nausea. Negative for anorexia.  ?Musculoskeletal:   ?     Bilateral lower leg pain  ?Ski

## 2021-10-19 ENCOUNTER — Other Ambulatory Visit: Payer: Self-pay | Admitting: Family

## 2021-10-19 DIAGNOSIS — E1169 Type 2 diabetes mellitus with other specified complication: Secondary | ICD-10-CM

## 2021-10-25 ENCOUNTER — Ambulatory Visit (INDEPENDENT_AMBULATORY_CARE_PROVIDER_SITE_OTHER): Payer: Medicare Other | Admitting: Family

## 2021-10-25 ENCOUNTER — Encounter: Payer: Self-pay | Admitting: Family

## 2021-10-25 VITALS — BP 104/62 | HR 64 | Temp 97.5°F | Ht 68.0 in | Wt 178.8 lb

## 2021-10-25 DIAGNOSIS — F1011 Alcohol abuse, in remission: Secondary | ICD-10-CM

## 2021-10-25 DIAGNOSIS — F320 Major depressive disorder, single episode, mild: Secondary | ICD-10-CM

## 2021-10-25 DIAGNOSIS — Z794 Long term (current) use of insulin: Secondary | ICD-10-CM

## 2021-10-25 DIAGNOSIS — K7030 Alcoholic cirrhosis of liver without ascites: Secondary | ICD-10-CM | POA: Diagnosis not present

## 2021-10-25 DIAGNOSIS — I10 Essential (primary) hypertension: Secondary | ICD-10-CM

## 2021-10-25 DIAGNOSIS — E785 Hyperlipidemia, unspecified: Secondary | ICD-10-CM | POA: Diagnosis not present

## 2021-10-25 DIAGNOSIS — E1169 Type 2 diabetes mellitus with other specified complication: Secondary | ICD-10-CM

## 2021-10-25 DIAGNOSIS — E1142 Type 2 diabetes mellitus with diabetic polyneuropathy: Secondary | ICD-10-CM

## 2021-10-25 DIAGNOSIS — K219 Gastro-esophageal reflux disease without esophagitis: Secondary | ICD-10-CM

## 2021-10-25 LAB — BAYER DCA HB A1C WAIVED: HB A1C (BAYER DCA - WAIVED): 6.2 % — ABNORMAL HIGH (ref 4.8–5.6)

## 2021-10-25 NOTE — Progress Notes (Addendum)
Subjective:    Patient ID: Thomas Pratt, male    DOB: 10-18-1960, 61 y.o.   MRN: [id number]  Chief Complaint  Patient presents with   Medical Management of Chronic Issues   PT presents to the office today for chronic follow up. He is followed by our clinical pharmacists  for DM education. He uses libre continuous glucose monitoring which has helped decrease his A1C.   He is followed by GI  for cirrhosis of liver. He has been sober since 03/2016.    He is complaining of bilateral foot and leg pain when walking. States it is a numbing pain of 9 out 10.  Hypertension This is a chronic problem. The current episode started more than 1 year ago. The problem has been resolved since onset. The problem is controlled. Associated symptoms include malaise/fatigue, peripheral edema and shortness of breath. Pertinent negatives include no blurred vision. Risk factors for coronary artery disease include dyslipidemia, diabetes mellitus, obesity and male gender. The current treatment provides moderate improvement. There is no history of CVA or heart failure.  Gastroesophageal Reflux He complains of belching, heartburn and a hoarse voice. This is a chronic problem. The current episode started more than 1 year ago. The problem occurs occasionally. Associated symptoms include fatigue. He has tried a PPI for the symptoms. The treatment provided moderate relief.  Hyperlipidemia This is a chronic problem. The current episode started more than 1 year ago. The problem is controlled. Recent lipid tests were reviewed and are normal. Exacerbating diseases include obesity. Associated symptoms include shortness of breath. Current antihyperlipidemic treatment includes statins. The current treatment provides moderate improvement of lipids. Risk factors for coronary artery disease include dyslipidemia, diabetes mellitus, male sex, hypertension and a sedentary lifestyle.  Diabetes He presents for his follow-up diabetic  visit. He has type 2 diabetes mellitus. There are no hypoglycemic associated symptoms. Associated symptoms include fatigue and foot paresthesias. Pertinent negatives for diabetes include no blurred vision. Symptoms are stable. Diabetic complications include peripheral neuropathy. Pertinent negatives for diabetic complications include no CVA or nephropathy. Risk factors for coronary artery disease include dyslipidemia, diabetes mellitus, male sex, hypertension and sedentary lifestyle. He is following a generally healthy diet. His overall blood glucose range is 110-130 mg/dl. Eye exam is current.  Depression        This is a chronic problem.  The current episode started more than 1 year ago.   The onset quality is gradual.   The problem occurs intermittently.  Associated symptoms include fatigue, helplessness, hopelessness, restlessness and sad.  Past treatments include SSRIs - Selective serotonin reuptake inhibitors.  Compliance with treatment is good.    Review of Systems  Constitutional:  Positive for fatigue and malaise/fatigue.  HENT:  Positive for hoarse voice.   Eyes:  Negative for blurred vision.  Respiratory:  Positive for shortness of breath.   Gastrointestinal:  Positive for heartburn.  Psychiatric/Behavioral:  Positive for depression.   All other systems reviewed and are negative.     Objective:   Physical Exam Vitals reviewed.  Constitutional:      General: He is not in acute distress.    Appearance: He is well-developed. He is obese.  HENT:     Head: Normocephalic.     Right Ear: Tympanic membrane normal.     Left Ear: Tympanic membrane normal.  Eyes:     General:        Right eye: No discharge.        Left eye: No  discharge.     Pupils: Pupils are equal, round, and reactive to light.  Neck:     Thyroid: No thyromegaly.  Cardiovascular:     Rate and Rhythm: Normal rate and regular rhythm.     Heart sounds: Normal heart sounds. No murmur heard. Pulmonary:     Effort:  Pulmonary effort is normal. No respiratory distress.     Breath sounds: Normal breath sounds. No wheezing.  Abdominal:     General: Bowel sounds are normal. There is no distension.     Palpations: Abdomen is soft.     Tenderness: There is abdominal tenderness (LLQ).  Musculoskeletal:        General: No tenderness. Normal range of motion.     Cervical back: Normal range of motion and neck supple.  Skin:    General: Skin is warm and dry.     Findings: No erythema or rash.  Neurological:     Mental Status: He is alert and oriented to person, place, and time.     Cranial Nerves: No cranial nerve deficit.     Deep Tendon Reflexes: Reflexes are normal and symmetric.  Psychiatric:        Behavior: Behavior normal.        Thought Content: Thought content normal.        Judgment: Judgment normal.      BP 104/62   Pulse 64   Temp (!) 97.5 F (36.4 C) (Temporal)   Ht '5\' 8"'  (1.727 m)   Wt 178 lb 12.8 oz (81.1 kg)   BMI 27.19 kg/m      Assessment & Plan:  Thien Berka comes in today with chief complaint of Medical Management of Chronic Issues   Diagnosis and orders addressed:  1. Primary hypertension - CMP14+EGFR - CBC with Differential/Platelet  2. Alcoholic cirrhosis of liver without ascites (HCC) - CMP14+EGFR - CBC with Differential/Platelet  3. Gastroesophageal reflux disease, unspecified whether esophagitis present - CMP14+EGFR - CBC with Differential/Platelet  4. Type 2 diabetes mellitus with other specified complication, with long-term current use of insulin (HCC) - Bayer DCA Hb A1c Waived - CMP14+EGFR - CBC with Differential/Platelet  5. Diabetic polyneuropathy associated with type 2 diabetes mellitus (HCC) - CMP14+EGFR - CBC with Differential/Platelet  6. Hyperlipidemia associated with type 2 diabetes mellitus (HCC) - CMP14+EGFR - CBC with Differential/Platelet - Lipid panel  7. History of ETOH abuse - CMP14+EGFR - CBC with  Differential/Platelet  8. Depression, major, single episode, mild (HCC) - CMP14+EGFR - CBC with Differential/Platelet   Labs pending Health Maintenance reviewed Diet and exercise encouraged  Follow up plan: 3 months and keep appt with GI   Evelina Dun, FNP

## 2021-10-25 NOTE — Patient Instructions (Signed)

## 2021-10-26 LAB — CMP14+EGFR
ALT: 38 IU/L (ref 0–44)
AST: 29 IU/L (ref 0–40)
Albumin/Globulin Ratio: 1.8 (ref 1.2–2.2)
Albumin: 4.6 g/dL (ref 3.8–4.8)
Alkaline Phosphatase: 61 IU/L (ref 44–121)
BUN/Creatinine Ratio: 10 (ref 10–24)
BUN: 10 mg/dL (ref 8–27)
Bilirubin Total: 0.6 mg/dL (ref 0.0–1.2)
CO2: 25 mmol/L (ref 20–29)
Calcium: 10 mg/dL (ref 8.6–10.2)
Chloride: 99 mmol/L (ref 96–106)
Creatinine, Ser: 1 mg/dL (ref 0.76–1.27)
Globulin, Total: 2.6 g/dL (ref 1.5–4.5)
Glucose: 128 mg/dL — ABNORMAL HIGH (ref 70–99)
Potassium: 4.9 mmol/L (ref 3.5–5.2)
Sodium: 140 mmol/L (ref 134–144)
Total Protein: 7.2 g/dL (ref 6.0–8.5)
eGFR: 86 mL/min/{1.73_m2} (ref >59)

## 2021-10-26 LAB — CBC WITH DIFFERENTIAL/PLATELET
Basophils Absolute: 0 10*3/uL (ref 0.0–0.2)
Basos: 0 %
EOS (ABSOLUTE): 0.1 10*3/uL (ref 0.0–0.4)
Eos: 2 %
Hematocrit: 46.2 % (ref 37.5–51.0)
Hemoglobin: 16.1 g/dL (ref 13.0–17.7)
Immature Grans (Abs): 0 10*3/uL (ref 0.0–0.1)
Immature Granulocytes: 0 %
Lymphocytes Absolute: 2.6 10*3/uL (ref 0.7–3.1)
Lymphs: 40 %
MCH: 32.9 pg (ref 26.6–33.0)
MCHC: 34.8 g/dL (ref 31.5–35.7)
MCV: 95 fL (ref 79–97)
Monocytes Absolute: 0.4 10*3/uL (ref 0.1–0.9)
Monocytes: 6 %
Neutrophils Absolute: 3.3 10*3/uL (ref 1.4–7.0)
Neutrophils: 52 %
Platelets: 195 10*3/uL (ref 150–450)
RBC: 4.89 x10E6/uL (ref 4.14–5.80)
RDW: 13.3 % (ref 11.6–15.4)
WBC: 6.4 10*3/uL (ref 3.4–10.8)

## 2021-10-26 LAB — LIPID PANEL
Chol/HDL Ratio: 3.5 ratio (ref 0.0–5.0)
Cholesterol, Total: 130 mg/dL (ref 100–199)
HDL: 37 mg/dL — ABNORMAL LOW (ref >39)
LDL Chol Calc (NIH): 66 mg/dL (ref 0–99)
Triglycerides: 156 mg/dL — ABNORMAL HIGH (ref 0–149)
VLDL Cholesterol Cal: 27 mg/dL (ref 5–40)

## 2021-11-01 ENCOUNTER — Ambulatory Visit: Payer: Medicare Other | Admitting: Pharmacist

## 2021-11-05 DIAGNOSIS — E119 Type 2 diabetes mellitus without complications: Secondary | ICD-10-CM | POA: Diagnosis not present

## 2021-11-06 ENCOUNTER — Ambulatory Visit (INDEPENDENT_AMBULATORY_CARE_PROVIDER_SITE_OTHER): Payer: Medicare Other | Admitting: Pharmacist

## 2021-11-06 DIAGNOSIS — E1169 Type 2 diabetes mellitus with other specified complication: Secondary | ICD-10-CM

## 2021-11-06 DIAGNOSIS — Z794 Long term (current) use of insulin: Secondary | ICD-10-CM

## 2021-11-06 NOTE — Patient Instructions (Addendum)
Visit Information ? ?Following are the goals we discussed today:  ?Diabetes: ?Controlled (much improved); current treatment: TRESIBA 26 units, METFORMIN, FARXIGA;  ?[place] 2 CGM--patient doing well on [place] system ?PATIENT WILL USE THE READER TO SCAN SENSOR (PHONE IS NOT COMPATIBLE) ?A1C 7.1%-->6.2%, GFR 94 ?BG are running on the higher end of 100s per patient, several readings above 200 ?Denies changes in diet, etc ? ?Current glucose readings: fasting glucose: 90-120, post prandial glucose: up to 200s ?Discussed meal planning options and Plate method for healthy eating ?EATS-EGGS/TOAST (B), MEAT &MASHED POTATOES/CORN (L)-->ONLY EATS TWICE DAILY ?Avoid sugary drinks and desserts ?Incorporate balanced protein, non starchy veggies, 1 serving of carbohydrate with each meal ?Increase water intake ?Increase physical activity as able ?Current exercise: N/A ?Educated on [place], [place] ?Counseled on DIABETES MEDICATIONS, LIBRE 2 CGM ? ?Plan: Telephone follow up appointment with care management team member scheduled for:  3 months ? ?Signature ?Regina Eck, PharmD, BCPS ?Clinical Pharmacist, [facility] ?[place]  [place] Phone [phone] ? ? ?Please call the care guide team at [phone] if you need to cancel or reschedule your appointment.  ? ?The patient verbalized understanding of instructions, educational materials, and care plan provided today and declined offer to receive copy of patient instructions, educational materials, and care plan.  ? ?

## 2021-11-06 NOTE — Progress Notes (Signed)
? ? ?Chronic Care Management ?Pharmacy Note ? ?11/06/2021 ?Name:  Thomas Pratt MRN:  [id number] DOB:  March 27, 1961 ? ?Summary: ?Diabetes: ?Controlled (much improved); current treatment: TRESIBA 26 units, METFORMIN, FARXIGA;  ?EDUCATION PROVIDED FOR LIBRE 2 CGM--patient doing well on [place] system ?PATIENT WILL USE THE READER TO SCAN SENSOR (PHONE IS NOT COMPATIBLE) ?A1C 7.1%-->6.2%, GFR 94 ?BG are running on the higher end of 100s per patient, several readings above 200 ?Denies changes in diet, etc ? ?Current glucose readings: fasting glucose: 90-120, post prandial glucose: up to 200s ?Discussed meal planning options and Plate method for healthy eating ?EATS-EGGS/TOAST (B), MEAT &MASHED POTATOES/CORN (L)-->ONLY EATS TWICE DAILY ?Avoid sugary drinks and desserts ?Incorporate balanced protein, non starchy veggies, 1 serving of carbohydrate with each meal ?Increase water intake ?Increase physical activity as able ?Current exercise: N/A ?Educated on [place], [place] ?Counseled on DIABETES MEDICATIONS, LIBRE 2 CGM ?  ?Subjective: ?Thomas Pratt is an 61 y.o. year old male who is a primary patient of Sharion Balloon, FNP.  The CCM team was consulted for assistance with disease management and care coordination needs.   ? ?Engaged with patient face to face for follow up visit in response to provider referral for pharmacy case management and/or care coordination services.  ? ?Consent to Services:  ?The patient was given information about Chronic Care Management services, agreed to services, and gave verbal consent prior to initiation of services.  Please see initial visit note for detailed documentation.  ? ?Patient Care Team: ?Sharion Balloon, FNP as PCP - General (Family Medicine) ?Rourk, Cristopher Estimable, MD as Consulting Physician (Gastroenterology) ?Lavera Guise, [facility] (Pharmacist) ?Celestia Khat, OD (Optometry) ? ?Objective: ? ?Lab Results  ?Component Value Date  ? CREATININE 1.00 10/25/2021  ? CREATININE 1.09  08/23/2021  ? CREATININE 1.20 08/23/2021  ? ? ?Lab Results  ?Component Value Date  ? HGBA1C 6.2 (H) 10/25/2021  ? ?Last diabetic Eye exam:  ?Lab Results  ?Component Value Date/Time  ? HMDIABEYEEXA No Retinopathy 01/31/2021 12:00 AM  ?  ?Last diabetic Foot exam: No results found for: HMDIABFOOTEX  ? ?   ?Component Value Date/Time  ? CHOL 130 10/25/2021 1032  ? TRIG 156 (H) 10/25/2021 1032  ? HDL 37 (L) 10/25/2021 1032  ? CHOLHDL 3.5 10/25/2021 1032  ? CHOLHDL 3.4 09/26/2017 1101  ? [place] 66 10/25/2021 1032  ? LDLCALC 108 (H) 09/26/2017 1101  ? ? ? ?  Latest Ref Rng & Units 10/25/2021  ? 10:32 AM 08/23/2021  ? 10:41 AM 06/26/2021  ? 11:05 AM  ?Hepatic Function  ?Total Protein 6.0 - 8.5 g/dL 7.2   8.6   7.3    ?Albumin 3.8 - 4.8 g/dL 4.6   4.7   4.4    ?AST 0 - 40 IU/L '29   26   25    ' ?ALT 0 - 44 IU/L 38   27   32    ?Alk Phosphatase 44 - 121 IU/L 61   59   69    ?Total Bilirubin 0.0 - 1.2 mg/dL 0.6   1.0   0.3    ? ? ?Lab Results  ?Component Value Date/Time  ? TSH 1.690 05/20/2019 02:52 PM  ? TSH 0.901 11/08/2018 02:01 PM  ? TSH 1.640 05/18/2018 10:01 AM  ? ? ? ?  Latest Ref Rng & Units 10/25/2021  ? 10:32 AM 08/23/2021  ? 10:41 AM 06/26/2021  ? 11:05 AM  ?CBC  ?WBC 3.4 - 10.8 x10E3/uL 6.4  8.4   8.8    ?Hemoglobin 13.0 - 17.7 g/dL 16.1   17.4   15.6    ?Hematocrit 37.5 - 51.0 % 46.2   50.4   44.0    ?Platelets 150 - 450 x10E3/uL 195   232   206    ? ? ?Lab Results  ?Component Value Date/Time  ? VD25OH 45.72 02/23/2021 10:23 AM  ? VD25OH 33.9 05/18/2018 10:01 AM  ? ? ?Clinical ASCVD: Yes  ?The ASCVD Risk score (Arnett DK, et al., 2019) failed to calculate for the following reasons: ?  The patient has a prior MI or stroke diagnosis   ? ?Other: (CHADS2VASc if Afib, PHQ9 if depression, MMRC or CAT for COPD, ACT, DEXA) ? ?Social History  ? ?Tobacco Use  ?Smoking Status Never  ?Smokeless Tobacco Never  ? ?BP Readings from Last 3 Encounters:  ?10/25/21 104/62  ?10/16/21 105/70  ?08/20/21 120/79  ? ?Pulse Readings from Last 3  Encounters:  ?10/25/21 64  ?10/16/21 62  ?08/20/21 70  ? ?Wt Readings from Last 3 Encounters:  ?10/25/21 178 lb 12.8 oz (81.1 kg)  ?10/16/21 176 lb (79.8 kg)  ?08/20/21 178 lb 6.4 oz (80.9 kg)  ? ? ?Assessment: Review of patient past medical history, allergies, medications, health status, including review of consultants reports, laboratory and other test data, was performed as part of comprehensive evaluation and provision of chronic care management services.  ? ?SDOH:  (Social Determinants of Health) assessments and interventions performed:  ? ? ?CCM Care Plan ? ?No Known Allergies ? ?Medications Reviewed Today   ? ? Reviewed by Lavera Guise, [facility] (Pharmacist) on 11/06/21 at 1312  Med List Status: <None>  ? ?Medication Order Taking? Sig Documenting Provider Last Dose Status Informant  ?Accu-Chek Softclix Lancets lancets [id number] No Test BS 4 (four) times daily. Dx E11.69  ?Patient taking differently: 1 each by Other route 4 (four) times daily as needed. Test BS 4 (four) times daily. Dx E11.69  ? Sharion Balloon, FNP Taking Active Self  ?atorvastatin (LIPITOR) 10 MG tablet [id number] No Take 1 tablet by mouth once daily Sharion Balloon, FNP Taking Active   ?Blood Glucose Monitoring Suppl (ACCU-CHEK GUIDE) w/Device KIT [id number] No Test BS 4 (four) times daily. Dx E11.69 Sharion Balloon, FNP Taking Active Self  ?cetirizine (ZYRTEC) 10 MG tablet [id number] No Take 1 tablet by mouth once daily Sharion Balloon, FNP Taking Active   ?Continuous Blood Gluc Sensor (FREESTYLE LIBRE 2 SENSOR) MISC [id number] No Use to test blood sugars 6x daily as directed Sharion Balloon, FNP Taking Active   ?dexlansoprazole (DEXILANT) 60 MG capsule [id number] No Take 1 capsule (60 mg total) by mouth daily. Sharion Balloon, FNP Taking Active   ?diclofenac Sodium (VOLTAREN) 1 % GEL [id number] No Apply 2 g topically 4 (four) times daily. Ivy Lynn, NP Taking Active   ?escitalopram (LEXAPRO) 10 MG tablet [id number] No Take 1 tablet  (10 mg total) by mouth daily. Sharion Balloon, FNP Taking Active   ?FARXIGA 10 MG TABS tablet [id number] No TAKE 1 TABLET BY MOUTH ONCE DAILY BEFORE BREAKFAST Hawks, Christy A, FNP Taking Active   ?fluticasone (FLONASE) 50 MCG/ACT nasal spray [id number] No Use 2 spray(s) in each nostril once daily Evelina Dun A, FNP Taking Active   ?furosemide (LASIX) 20 MG tablet [id number] No Take 1 tablet by mouth once daily Annitta Needs, NP Taking Active   ?gabapentin (NEURONTIN) 600 MG tablet [id number] No TAKE 1 TABLET  BY MOUTH THREE TIMES DAILY Sharion Balloon, FNP Taking Active   ?glucose blood (ACCU-CHEK GUIDE) test strip [id number] No Test BS 4 (four) times daily. Dx E11.69  ?Patient taking differently: 1 each by Other route 4 (four) times daily as needed. Test BS 4 (four) times daily. Dx E11.69  ? Sharion Balloon, FNP Taking Active Self  ?Insulin Pen Needle (B-D UF III MINI PEN NEEDLES) 31G X 5 MM MISC [id number] No USE WITH TRESIBA DAILY Evelina Dun A, FNP Taking Active   ?metFORMIN (GLUCOPHAGE-XR) 750 MG 24 hr tablet [id number] No TAKE 2 TABLETS BY MOUTH ONCE DAILY WITH BREAKFAST Hawks, Christy A, FNP Taking Active   ?nadolol (CORGARD) 40 MG tablet [id number] No Take 1 tablet by mouth once daily Annitta Needs, NP Taking Active   ?nystatin cream (MYCOSTATIN) [id number] No Apply 1 application topically 2 (two) times daily. Dettinger, Fransisca Kaufmann, MD Taking Active   ?ondansetron (ZOFRAN) 4 MG tablet [id number] No Take 1 tablet (4 mg total) by mouth every 8 (eight) hours as needed for nausea or vomiting. Ivy Lynn, NP Taking Active   ?rifaximin (XIFAXAN) 550 MG TABS tablet [id number] No Take 1 tablet (550 mg total) by mouth 2 (two) times daily. Carlis Stable, NP Taking Active   ?spironolactone (ALDACTONE) 50 MG tablet [id number] No Take 1 tablet (50 mg total) by mouth daily. Ivy Lynn, NP Taking Active   ?TRESIBA FLEXTOUCH 100 UNIT/ML FlexTouch Pen [id number] No INJECT 25 UNITS SUBCUTANEOUSLY AT BEDTIME Sharion Balloon, FNP Taking Active   ?VITAMIN D PO [id number] No Take 1 capsule by mouth daily.  [provider] Taking Active Self  ? ?  ?  ? ?  ? ? ?Patient Active Problem List  ? Diagnosis Date Noted  ? Nausea 0

## 2021-11-08 ENCOUNTER — Ambulatory Visit: Payer: Medicare Other

## 2021-11-14 ENCOUNTER — Ambulatory Visit (INDEPENDENT_AMBULATORY_CARE_PROVIDER_SITE_OTHER): Payer: Medicare Other

## 2021-11-14 VITALS — Ht 68.0 in | Wt 176.0 lb

## 2021-11-14 DIAGNOSIS — Z0000 Encounter for general adult medical examination without abnormal findings: Secondary | ICD-10-CM

## 2021-11-14 DIAGNOSIS — H9193 Unspecified hearing loss, bilateral: Secondary | ICD-10-CM

## 2021-11-14 DIAGNOSIS — Z0001 Encounter for general adult medical examination with abnormal findings: Secondary | ICD-10-CM | POA: Diagnosis not present

## 2021-11-14 NOTE — Patient Instructions (Addendum)
[name] , ?Thank you for taking time to come for your Medicare Wellness Visit. I appreciate your ongoing commitment to your health goals. Please review the following plan we discussed and let me know if I can assist you in the future.  ? ?Screening recommendations/referrals: ?Colonoscopy: Done 03/07/2017 Repeat in 5 years ? ?Recommended yearly ophthalmology/optometry visit for glaucoma screening and checkup ?Recommended yearly dental visit for hygiene and checkup ? ?Vaccinations: ?Influenza vaccine: Done 05/01/2021 Repeat annually ? ?Pneumococcal vaccine: Done 09/26/2017 and 03/15/2021 ?Tdap vaccine: Done 09/26/2017 Repeat in 10 years ? ?Shingles vaccine: Done 02/28/2021 and 11/27/2020   ?Covid-19: Done 12/06/2020, 12/15/2019 and 11/17/2019 ? ?Advanced directives: Advance directive discussed with you today. Even though you declined this today, please call our office should you change your mind, and we can give you the proper paperwork for you to fill out. ? ? ?Conditions/risks identified: Aim for 30 minutes of exercise or brisk walking, 6-8 glasses of water, and 5 servings of fruits and vegetables each day. ? ? ?Next appointment: Follow up in one year for your annual wellness visit 11/20/22 @ 11:15 AM. ? ?Preventive Care 40-64 Years, Male ?Preventive care refers to lifestyle choices and visits with your health care provider that can promote health and wellness. ?What does preventive care include? ?A yearly physical exam. This is also called an annual well check. ?Dental exams once or twice a year. ?Routine eye exams. Ask your health care provider how often you should have your eyes checked. ?Personal lifestyle choices, including: ?Daily care of your teeth and gums. ?Regular physical activity. ?Eating a healthy diet. ?Avoiding tobacco and drug use. ?Limiting alcohol use. ?Practicing safe sex. ?Taking low-dose aspirin every day starting at age 90. ?What happens during an annual well check? ?The services and screenings done  by your health care provider during your annual well check will depend on your age, overall health, lifestyle risk factors, and family history of disease. ?Counseling  ?Your health care provider may ask you questions about your: ?Alcohol use. ?Tobacco use. ?Drug use. ?Emotional well-being. ?Home and relationship well-being. ?Sexual activity. ?Eating habits. ?Work and work Statistician. ?Screening  ?You may have the following tests or measurements: ?Height, weight, and BMI. ?Blood pressure. ?Lipid and cholesterol levels. These may be checked every 5 years, or more frequently if you are over 35 years old. ?Skin check. ?Lung cancer screening. You may have this screening every year starting at age 49 if you have a 30-pack-year history of smoking and currently smoke or have quit within the past 15 years. ?Fecal occult blood test (FOBT) of the stool. You may have this test every year starting at age 60. ?Flexible sigmoidoscopy or colonoscopy. You may have a sigmoidoscopy every 5 years or a colonoscopy every 10 years starting at age 14. ?Prostate cancer screening. Recommendations will vary depending on your family history and other risks. ?Hepatitis C blood test. ?Hepatitis B blood test. ?Sexually transmitted disease (STD) testing. ?Diabetes screening. This is done by checking your blood sugar (glucose) after you have not eaten for a while (fasting). You may have this done every 1-3 years. ?Discuss your test results, treatment options, and if necessary, the need for more tests with your health care provider. ?Vaccines  ?Your health care provider may recommend certain vaccines, such as: ?Influenza vaccine. This is recommended every year. ?Tetanus, diphtheria, and acellular pertussis (Tdap, Td) vaccine. You may need a Td booster every 10 years. ?Zoster vaccine. You may need this after age 11. ?Pneumococcal 13-valent conjugate (PCV13)  vaccine. You may need this if you have certain conditions and have not been  vaccinated. ?Pneumococcal polysaccharide (PPSV23) vaccine. You may need one or two doses if you smoke cigarettes or if you have certain conditions. ?Talk to your health care provider about which screenings and vaccines you need and how often you need them. ?This information is not intended to replace advice given to you by your health care provider. Make sure you discuss any questions you have with your health care provider. ?Document Released: 08/04/2015 Document Revised: 03/27/2016 Document Reviewed: 05/09/2015 ?Elsevier Interactive Patient Education ? 2017 [employer]. ? ?Fall Prevention in the Home ?Falls can cause injuries. They can happen to people of all ages. There are many things you can do to make your home safe and to help prevent falls. ?What can I do on the outside of my home? ?Regularly fix the edges of walkways and driveways and fix any cracks. ?Remove anything that might make you trip as you walk through a door, such as a raised step or threshold. ?Trim any bushes or trees on the path to your home. ?Use bright outdoor lighting. ?Clear any walking paths of anything that might make someone trip, such as rocks or tools. ?Regularly check to see if handrails are loose or broken. Make sure that both sides of any steps have handrails. ?Any raised decks and porches should have guardrails on the edges. ?Have any leaves, snow, or ice cleared regularly. ?Use sand or salt on walking paths during winter. ?Clean up any spills in your garage right away. This includes oil or grease spills. ?What can I do in the bathroom? ?Use night lights. ?Install grab bars by the toilet and in the tub and shower. Do not use towel bars as grab bars. ?Use non-skid mats or decals in the tub or shower. ?If you need to sit down in the shower, use a plastic, non-slip stool. ?Keep the floor dry. Clean up any water that spills on the floor as soon as it happens. ?Remove soap buildup in the tub or shower regularly. ?Attach bath mats  securely with double-sided non-slip rug tape. ?Do not have throw rugs and other things on the floor that can make you trip. ?What can I do in the bedroom? ?Use night lights. ?Make sure that you have a light by your bed that is easy to reach. ?Do not use any sheets or blankets that are too big for your bed. They should not hang down onto the floor. ?Have a firm chair that has side arms. You can use this for support while you get dressed. ?Do not have throw rugs and other things on the floor that can make you trip. ?What can I do in the kitchen? ?Clean up any spills right away. ?Avoid walking on wet floors. ?Keep items that you use a lot in easy-to-reach places. ?If you need to reach something above you, use a strong step stool that has a grab bar. ?Keep electrical cords out of the way. ?Do not use floor polish or wax that makes floors slippery. If you must use wax, use non-skid floor wax. ?Do not have throw rugs and other things on the floor that can make you trip. ?What can I do with my stairs? ?Do not leave any items on the stairs. ?Make sure that there are handrails on both sides of the stairs and use them. Fix handrails that are broken or loose. Make sure that handrails are as long as the stairways. ?Check any carpeting  to make sure that it is firmly attached to the stairs. Fix any carpet that is loose or worn. ?Avoid having throw rugs at the top or bottom of the stairs. If you do have throw rugs, attach them to the floor with carpet tape. ?Make sure that you have a light switch at the top of the stairs and the bottom of the stairs. If you do not have them, ask someone to add them for you. ?What else can I do to help prevent falls? ?Wear shoes that: ?Do not have high heels. ?Have rubber bottoms. ?Are comfortable and fit you well. ?Are closed at the toe. Do not wear sandals. ?If you use a stepladder: ?Make sure that it is fully opened. Do not climb a closed stepladder. ?Make sure that both sides of the stepladder  are locked into place. ?Ask someone to hold it for you, if possible. ?Clearly mark and make sure that you can see: ?Any grab bars or handrails. ?First and last steps. ?Where the edge of each step is. ?Use tools that help yo

## 2021-11-14 NOTE — Progress Notes (Signed)
? ?Subjective:  ? Thomas Pratt is a 61 y.o. male who presents for Medicare Annual/Subsequent preventive examination. ?Virtual Visit via Telephone Note ? ?I connected with  Thomas Pratt on 11/14/21 at 11:15 AM EDT by telephone and verified that I am speaking with the correct person using two identifiers. ? ?Location: ?Patient: HOME ?Provider: [facility] ?Persons participating in the virtual visit: patient/Nurse Health Advisor ?  ?I discussed the limitations, risks, security and privacy concerns of performing an evaluation and management service by telephone and the availability of in person appointments. The patient expressed understanding and agreed to proceed. ? ?Interactive audio and video telecommunications were attempted between this nurse and patient, however failed, due to patient having technical difficulties OR patient did not have access to video capability.  We continued and completed visit with audio only. ? ?Some vital signs may be absent or patient reported.  ? ?Chriss Driver, LPN ? ?Review of Systems    ? ?Cardiac Risk Factors include: advanced age (>68mn, >>68women);hypertension;diabetes mellitus;dyslipidemia;male gender;sedentary lifestyle ? ?   ?Objective:  ?  ?Today's Vitals  ? 11/14/21 1111 11/14/21 1113  ?Weight: 176 lb (79.8 kg)   ?Height: '5\' 8"'  (1.727 m)   ?PainSc:  5   ? ?Body mass index is 26.76 kg/m?. ? ? ?  11/14/2021  ? 11:20 AM 11/07/2020  ? 11:02 AM 12/23/2019  ? 10:19 AM 12/22/2019  ?  8:37 PM 11/05/2019  ?  1:29 PM 05/03/2019  ?  8:35 AM 11/08/2018  ?  6:13 PM  ?Advanced Directives  ?Does Patient Have a Medical Advance Directive? No No No No No No   ?Would patient like information on creating a medical advance directive? No - Patient declined Yes (MAU/Ambulatory/Procedural Areas - Information given) No - Patient declined No - Patient declined No - Patient declined No - Patient declined No - Patient declined  ? ? ?Current Medications (verified) ?Outpatient Encounter Medications as  of 11/14/2021  ?Medication Sig  ? Accu-Chek Softclix Lancets lancets Test BS 4 (four) times daily. Dx E11.69 (Patient taking differently: 1 each by Other route 4 (four) times daily as needed. Test BS 4 (four) times daily. Dx E11.69)  ? atorvastatin (LIPITOR) 10 MG tablet Take 1 tablet by mouth once daily  ? Blood Glucose Monitoring Suppl (ACCU-CHEK GUIDE) w/Device KIT Test BS 4 (four) times daily. Dx E11.69  ? cetirizine (ZYRTEC) 10 MG tablet Take 1 tablet by mouth once daily  ? Continuous Blood Gluc Sensor (FREESTYLE LIBRE 2 SENSOR) MISC Use to test blood sugars 6x daily as directed  ? dexlansoprazole (DEXILANT) 60 MG capsule Take 1 capsule (60 mg total) by mouth daily.  ? diclofenac Sodium (VOLTAREN) 1 % GEL Apply 2 g topically 4 (four) times daily.  ? escitalopram (LEXAPRO) 10 MG tablet Take 1 tablet (10 mg total) by mouth daily.  ? FARXIGA 10 MG TABS tablet TAKE 1 TABLET BY MOUTH ONCE DAILY BEFORE BREAKFAST  ? fluticasone (FLONASE) 50 MCG/ACT nasal spray Use 2 spray(s) in each nostril once daily  ? furosemide (LASIX) 20 MG tablet Take 1 tablet by mouth once daily  ? gabapentin (NEURONTIN) 600 MG tablet TAKE 1 TABLET BY MOUTH THREE TIMES DAILY  ? glucose blood (ACCU-CHEK GUIDE) test strip Test BS 4 (four) times daily. Dx E11.69 (Patient taking differently: 1 each by Other route 4 (four) times daily as needed. Test BS 4 (four) times daily. Dx E11.69)  ? Insulin Pen Needle (B-D UF III MINI PEN NEEDLES) 31G X  5 MM MISC USE WITH TRESIBA DAILY  ? metFORMIN (GLUCOPHAGE-XR) 750 MG 24 hr tablet TAKE 2 TABLETS BY MOUTH ONCE DAILY WITH BREAKFAST  ? nadolol (CORGARD) 40 MG tablet Take 1 tablet by mouth once daily  ? nystatin cream (MYCOSTATIN) Apply 1 application topically 2 (two) times daily.  ? ondansetron (ZOFRAN) 4 MG tablet Take 1 tablet (4 mg total) by mouth every 8 (eight) hours as needed for nausea or vomiting.  ? rifaximin (XIFAXAN) 550 MG TABS tablet Take 1 tablet (550 mg total) by mouth 2 (two) times daily.  ?  spironolactone (ALDACTONE) 50 MG tablet Take 1 tablet (50 mg total) by mouth daily.  ? TRESIBA FLEXTOUCH 100 UNIT/ML FlexTouch Pen INJECT 25 UNITS SUBCUTANEOUSLY AT BEDTIME  ? VITAMIN D PO Take 1 capsule by mouth daily.   ? ?No facility-administered encounter medications on file as of 11/14/2021.  ? ? ?Allergies (verified) ?Patient has no known allergies.  ? ?History: ?Past Medical History:  ?Diagnosis Date  ? Cirrhosis ([place])   ? likely ETOH use   ? Diabetes ([place])   ? Heat stroke 2016/17  ? ?Past Surgical History:  ?Procedure Laterality Date  ? CHOLECYSTECTOMY N/A 09/01/2017  ? Procedure: LAPAROSCOPIC CHOLECYSTECTOMY;  Surgeon: Aviva Signs, MD;  Location: [facility];  Service: General;  Laterality: N/A;  ? COLONOSCOPY WITH PROPOFOL N/A 03/06/2017  ? Dr. Gala Romney: tubular adenoma removed, next TCS 5 years if health permits.   ? ESOPHAGOGASTRODUODENOSCOPY (EGD) WITH PROPOFOL N/A 03/06/2017  ? Dr. Gala Romney: grade 1 esophageal varices, erosive esophagitis, portal gastropathy.   ? ESOPHAGOGASTRODUODENOSCOPY (EGD) WITH PROPOFOL N/A 05/03/2019  ? Procedure: ESOPHAGOGASTRODUODENOSCOPY (EGD) WITH PROPOFOL;  Surgeon: Daneil Dolin, MD;  [facility];  Service: Endoscopy;  Laterality: N/A;  10:30am  ? ESOPHAGOGASTRODUODENOSCOPY (EGD) WITH PROPOFOL N/A 12/23/2019  ? Procedure: ESOPHAGOGASTRODUODENOSCOPY (EGD) WITH PROPOFOL Possible esophageal variceal banding.;  Surgeon: Daneil Dolin, MD;  [facility]: [facility];  Service: Endoscopy;  Laterality: N/A;  ? HERNIA REPAIR Right   ? RIH  ? INGUINAL HERNIA REPAIR Right 07/11/2017  ? Procedure: HERNIA REPAIR INGUINAL ADULT WITH MESH;  Surgeon: Aviva Signs, MD;  Location: [facility];  Service: General;  Laterality: Right;  patient knows to arrive at 7:30  ? IR TRANSCATHETER BX  10/14/2017  ? IR US GUIDE VASC ACCESS RIGHT  10/14/2017  ? IR VENOGRAM HEPATIC WO HEMODYNAMIC EVALUATION  10/14/2017  ? MALONEY DILATION N/A 05/03/2019  ? Procedure: MALONEY DILATION;  Surgeon: Daneil Dolin,  MD;  [facility];  Service: Endoscopy;  Laterality: N/A;  ? POLYPECTOMY  03/06/2017  ? Procedure: POLYPECTOMY;  Surgeon: Daneil Dolin, MD;  Location: [facility];  Service: Endoscopy;;  ascending colon;  ? ?Family History  ?Problem Relation Age of Onset  ? COPD Mother   ? Liver disease Neg Hx   ? Colon cancer Neg Hx   ? Colon polyps Neg Hx   ? ?Social History  ? ?Socioeconomic History  ? Marital status: Divorced  ?  Spouse name: Not on file  ? Number of children: Not on file  ? Years of education: Not on file  ? Highest education level: Not on file  ?Occupational History  ? Occupation: unemployed  ?  Comment: disability  ?Tobacco Use  ? Smoking status: Never  ? Smokeless tobacco: Never  ?Vaping Use  ? Vaping Use: Never used  ?Substance and Sexual Activity  ? Alcohol use: No  ?  Comment: Quit September 2017, heavy  alcohol abuse previously  ? Drug use: No  ? Sexual activity: Not Currently  ?  Partners: Female  ?Other Topics Concern  ? Not on file  ?Social History Narrative  ? Lives in [place], [place] with his youngest daughter; 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   ? Drank alcohol for years, quit 05/07/16. Has been sober since then. Quit b/c of health/cirrhosis.  ? Divorced for over 15 years.  ? Dating sporadically.  ? Has 3 daughters and 1 son live locally.  ? Originally from [place], moved here b/c of wife.   ? Eats all foods.   ? Wear seatbelt.  ? Does not drive, does not have a license, lost due to DWI.   ? Brought by neighbor.   ? Does not go to church. Believes in God. Prays each night, per report.   ? ?Social Determinants of Health  ? ?Financial Resource Strain: Low Risk   ? Difficulty of Paying Living Expenses: Not hard at all  ?Food Insecurity: No Food Insecurity  ? Worried About Charity fundraiser in the Last Year: Never true  ? Ran Out of Food in the Last Year: Never true  ?Transportation Needs: No Transportation Needs  ? Lack of Transportation (Medical): No  ? Lack of  Transportation (Non-Medical): No  ?Physical Activity: Sufficiently Active  ? Days of Exercise per Week: 5 days  ? Minutes of Exercise per Session: 30 min  ?Stress: No Stress Concern Present  ? Feeling of Stress : O

## 2021-11-16 ENCOUNTER — Other Ambulatory Visit: Payer: Self-pay | Admitting: Family

## 2021-11-16 DIAGNOSIS — E1169 Type 2 diabetes mellitus with other specified complication: Secondary | ICD-10-CM

## 2021-11-18 DIAGNOSIS — E785 Hyperlipidemia, unspecified: Secondary | ICD-10-CM

## 2021-11-18 DIAGNOSIS — E1169 Type 2 diabetes mellitus with other specified complication: Secondary | ICD-10-CM

## 2021-11-18 DIAGNOSIS — Z794 Long term (current) use of insulin: Secondary | ICD-10-CM

## 2021-11-21 ENCOUNTER — Ambulatory Visit (INDEPENDENT_AMBULATORY_CARE_PROVIDER_SITE_OTHER): Payer: Medicare Other | Admitting: Pharmacist

## 2021-11-21 DIAGNOSIS — E1169 Type 2 diabetes mellitus with other specified complication: Secondary | ICD-10-CM

## 2021-11-21 DIAGNOSIS — E119 Type 2 diabetes mellitus without complications: Secondary | ICD-10-CM

## 2021-11-23 ENCOUNTER — Other Ambulatory Visit: Payer: Self-pay | Admitting: Family

## 2021-11-23 ENCOUNTER — Other Ambulatory Visit: Payer: Self-pay | Admitting: Nurse Practitioner

## 2021-11-23 DIAGNOSIS — R110 Nausea: Secondary | ICD-10-CM

## 2021-11-23 DIAGNOSIS — E1169 Type 2 diabetes mellitus with other specified complication: Secondary | ICD-10-CM

## 2021-11-23 NOTE — Progress Notes (Signed)
? ? ?Chronic Care Management ?Pharmacy Note ? ?11/21/2021 ?Name:  Thomas Pratt MRN:  [id number] DOB:  [birth year] ? ?Summary: ?Diabetes: ?Controlled (much improved); current treatment: TRESIBA 25 units, METFORMIN, FARXIGA;  ?EDUCATION PROVIDED FOR LIBRE 2 CGM--patient doing well on [place] system ?PATIENT WILL USE THE READER TO SCAN SENSOR (PHONE IS NOT COMPATIBLE) ?A1C 7.1%-->6.2%, GFR 94 ?BG are running on the higher end of 100s per patient, several readings above 200 ?Denies changes in diet, etc ?Encouraged patient to eat 3 healthy plate meals per day/max 45-50 carbs each ?Incorporate exercise & more water into lifestyle ?Current glucose readings: fasting glucose: 90-120, post prandial glucose: up to 200s ?Discussed meal planning options and Plate method for healthy eating ?Avoid sugary drinks and desserts ?Incorporate balanced protein, non starchy veggies, 1 serving of carbohydrate with each meal ?Increase water intake ?Increase physical activity as able ?Current exercise: N/A ?Educated on [place], [place] ?Counseled on DIABETES MEDICATIONS, LIBRE 2 CGM ?Recommended consider GLP1 given liver/history of CVA & potentially use less insulin ?Subjective: ?Thomas Pratt is an 61 y.o. year old male who is a primary patient of Sharion Balloon, FNP.  The CCM team was consulted for assistance with disease management and care coordination needs.   ? ?Engaged with patient by telephone for follow up visit in response to provider referral for pharmacy case management and/or care coordination services.  ? ?Consent to Services:  ?The patient was given information about Chronic Care Management services, agreed to services, and gave verbal consent prior to initiation of services.  Please see initial visit note for detailed documentation.  ? ?Patient Care Team: ?Sharion Balloon, FNP as PCP - General (Family Medicine) ?Rourk, Cristopher Estimable, MD as Consulting Physician (Gastroenterology) ?Lavera Guise, [facility]  (Pharmacist) ?Celestia Khat, OD (Optometry) ? ?Objective: ? ?Lab Results  ?Component Value Date  ? CREATININE 1.00 10/25/2021  ? CREATININE 1.09 08/23/2021  ? CREATININE 1.20 08/23/2021  ? ? ?Lab Results  ?Component Value Date  ? HGBA1C 6.2 (H) 10/25/2021  ? ?Last diabetic Eye exam:  ?Lab Results  ?Component Value Date/Time  ? HMDIABEYEEXA No Retinopathy 01/31/2021 12:00 AM  ?  ?Last diabetic Foot exam: No results found for: HMDIABFOOTEX  ? ?   ?Component Value Date/Time  ? CHOL 130 10/25/2021 1032  ? TRIG 156 (H) 10/25/2021 1032  ? HDL 37 (L) 10/25/2021 1032  ? CHOLHDL 3.5 10/25/2021 1032  ? CHOLHDL 3.4 09/26/2017 1101  ? [place] 66 10/25/2021 1032  ? LDLCALC 108 (H) 09/26/2017 1101  ? ? ? ?  Latest Ref Rng & Units 10/25/2021  ? 10:32 AM 08/23/2021  ? 10:41 AM 06/26/2021  ? 11:05 AM  ?Hepatic Function  ?Total Protein 6.0 - 8.5 g/dL 7.2   8.6   7.3    ?Albumin 3.8 - 4.8 g/dL 4.6   4.7   4.4    ?AST 0 - 40 IU/L '29   26   25    ' ?ALT 0 - 44 IU/L 38   27   32    ?Alk Phosphatase 44 - 121 IU/L 61   59   69    ?Total Bilirubin 0.0 - 1.2 mg/dL 0.6   1.0   0.3    ? ? ?Lab Results  ?Component Value Date/Time  ? TSH 1.690 05/20/2019 02:52 PM  ? TSH 0.901 11/08/2018 02:01 PM  ? TSH 1.640 05/18/2018 10:01 AM  ? ? ? ?  Latest Ref Rng & Units 10/25/2021  ? 10:32 AM 08/23/2021  ?  10:41 AM 06/26/2021  ? 11:05 AM  ?CBC  ?WBC 3.4 - 10.8 x10E3/uL 6.4   8.4   8.8    ?Hemoglobin 13.0 - 17.7 g/dL 16.1   17.4   15.6    ?Hematocrit 37.5 - 51.0 % 46.2   50.4   44.0    ?Platelets 150 - 450 x10E3/uL 195   232   206    ? ? ?Lab Results  ?Component Value Date/Time  ? VD25OH 45.72 02/23/2021 10:23 AM  ? VD25OH 33.9 05/18/2018 10:01 AM  ? ? ?Clinical ASCVD: Yes  ?The ASCVD Risk score (Arnett DK, et al., 2019) failed to calculate for the following reasons: ?  The patient has a prior MI or stroke diagnosis   ? ?Other: (CHADS2VASc if Afib, PHQ9 if depression, MMRC or CAT for COPD, ACT, DEXA) ? ?Social History  ? ?Tobacco Use  ?Smoking Status Never   ?Smokeless Tobacco Never  ? ?BP Readings from Last 3 Encounters:  ?10/25/21 104/62  ?10/16/21 105/70  ?08/20/21 120/79  ? ?Pulse Readings from Last 3 Encounters:  ?10/25/21 64  ?10/16/21 62  ?08/20/21 70  ? ?Wt Readings from Last 3 Encounters:  ?11/14/21 176 lb (79.8 kg)  ?10/25/21 178 lb 12.8 oz (81.1 kg)  ?10/16/21 176 lb (79.8 kg)  ? ? ?Assessment: Review of patient past medical history, allergies, medications, health status, including review of consultants reports, laboratory and other test data, was performed as part of comprehensive evaluation and provision of chronic care management services.  ? ?SDOH:  (Social Determinants of Health) assessments and interventions performed:  ? ? ?CCM Care Plan ? ?No Known Allergies ? ?Medications Reviewed Today   ? ? Reviewed by Lavera Guise, [facility] (Pharmacist) on 11/23/21 at 1008  Med List Status: <None>  ? ?Medication Order Taking? Sig Documenting Provider Last Dose Status Informant  ?Accu-Chek Softclix Lancets lancets [id number] No Test BS 4 (four) times daily. Dx E11.69  ?Patient taking differently: 1 each by Other route 4 (four) times daily as needed. Test BS 4 (four) times daily. Dx E11.69  ? Sharion Balloon, FNP Taking Active Self  ?atorvastatin (LIPITOR) 10 MG tablet [id number] No Take 1 tablet by mouth once daily Sharion Balloon, FNP Taking Active   ?Blood Glucose Monitoring Suppl (ACCU-CHEK GUIDE) w/Device KIT [id number] No Test BS 4 (four) times daily. Dx E11.69 Sharion Balloon, FNP Taking Active Self  ?cetirizine (ZYRTEC) 10 MG tablet [id number] No Take 1 tablet by mouth once daily Sharion Balloon, FNP Taking Active   ?Continuous Blood Gluc Sensor (FREESTYLE LIBRE 2 SENSOR) MISC [id number] No Use to test blood sugars 6x daily as directed Sharion Balloon, FNP Taking Active   ?dexlansoprazole (DEXILANT) 60 MG capsule [id number] No Take 1 capsule (60 mg total) by mouth daily. Sharion Balloon, FNP Taking Active   ?diclofenac Sodium (VOLTAREN) 1 % GEL [id number] No  Apply 2 g topically 4 (four) times daily. Ivy Lynn, NP Taking Active   ?escitalopram (LEXAPRO) 10 MG tablet [id number] No Take 1 tablet (10 mg total) by mouth daily. Sharion Balloon, FNP Taking Active   ?FARXIGA 10 MG TABS tablet [id number] No TAKE 1 TABLET BY MOUTH ONCE DAILY BEFORE BREAKFAST Hawks, Christy A, FNP Taking Active   ?fluticasone (FLONASE) 50 MCG/ACT nasal spray [id number] No Use 2 spray(s) in each nostril once daily Evelina Dun A, FNP Taking Active   ?furosemide (LASIX) 20 MG tablet [id number] No Take 1 tablet by mouth once daily Roseanne Kaufman  W, NP Taking Active   ?gabapentin (NEURONTIN) 600 MG tablet [id number] No TAKE 1 TABLET BY MOUTH THREE TIMES DAILY Hawks, Christy A, FNP Taking Active   ?glucose blood (ACCU-CHEK GUIDE) test strip [id number] No Test BS 4 (four) times daily. Dx E11.69  ?Patient taking differently: 1 each by Other route 4 (four) times daily as needed. Test BS 4 (four) times daily. Dx E11.69  ? Sharion Balloon, FNP Taking Active Self  ?Insulin Pen Needle (B-D UF III MINI PEN NEEDLES) 31G X 5 MM MISC [id number] No USE WITH TRESIBA DAILY Evelina Dun A, FNP Taking Active   ?metFORMIN (GLUCOPHAGE-XR) 750 MG 24 hr tablet [id number] No TAKE 2 TABLETS BY MOUTH ONCE DAILY WITH BREAKFAST Hawks, Christy A, FNP Taking Active   ?nadolol (CORGARD) 40 MG tablet [id number] No Take 1 tablet by mouth once daily Annitta Needs, NP Taking Active   ?nystatin cream (MYCOSTATIN) [id number] No Apply 1 application topically 2 (two) times daily. Dettinger, Fransisca Kaufmann, MD Taking Active   ?ondansetron (ZOFRAN) 4 MG tablet [id number] No Take 1 tablet (4 mg total) by mouth every 8 (eight) hours as needed for nausea or vomiting. Ivy Lynn, NP Taking Active   ?rifaximin (XIFAXAN) 550 MG TABS tablet [id number] No Take 1 tablet (550 mg total) by mouth 2 (two) times daily. Carlis Stable, NP Taking Active   ?spironolactone (ALDACTONE) 50 MG tablet [id number] No Take 1 tablet (50 mg total) by mouth daily. Ivy Lynn, NP Taking Active   ?TRESIBA FLEXTOUCH 100 UNIT/ML FlexTouch Pen [id number]  INJECT 25 UNITS SUBCUTANEOUSLY AT BEDTIME Sharion Balloon, FNP  Active   ?VITAMIN D PO [id number] No Take 1 capsule by mouth daily.  Provider, H

## 2021-11-23 NOTE — Patient Instructions (Signed)
Visit Information ? ?Following are the goals we discussed today:  ?Current Barriers:  ?Unable to independently monitor therapeutic efficacy ? ?Pharmacist Clinical Goal(s):  ?Over the next 90 days, patient will achieve adherence to monitoring guidelines and medication adherence to achieve therapeutic efficacy ?maintain control of T2DM as evidenced by GOAL A1C<7%  through collaboration with PharmD and provider.  ? ? ?Interventions: ?1:1 collaboration with Sharion Balloon, FNP regarding development and update of comprehensive plan of care as evidenced by provider attestation and co-signature ?Inter-disciplinary care team collaboration (see longitudinal plan of care) ?Comprehensive medication review performed; medication list updated in electronic medical record ? ?Diabetes: ?Controlled (much improved); current treatment: TRESIBA 26 units, METFORMIN, FARXIGA;  ?EDUCATION PROVIDED FOR LIBRE 2 CGM--patient doing well on [place] system ?PATIENT WILL USE THE READER TO SCAN SENSOR (PHONE IS NOT COMPATIBLE) ?A1C 7.1%-->6.2%, GFR 94 ?BG are running on the higher end of 100s per patient, several readings above 200 ?Denies changes in diet, etc ?Encouraged patient to eat 3 healthy plate meals per day/max 45-50 carbs each ?Incorporate exercise & more water into lifestyle ?Current glucose readings: fasting glucose: 90-120, post prandial glucose: up to 200s ?Discussed meal planning options and Plate method for healthy eating ?Avoid sugary drinks and desserts ?Incorporate balanced protein, non starchy veggies, 1 serving of carbohydrate with each meal ?Increase water intake ?Increase physical activity as able ?Current exercise: N/A ?Educated on [place], [place] ?Counseled on DIABETES MEDICATIONS, LIBRE 2 CGM ?Recommended consider GLP1 given liver history & potentially use less insulin ? ? ?Patient Goals/Self-Care Activities ?Over the next 90 days, patient will:  ?- take medications as prescribed ?check glucose CONTINUOUSLY USING  LIBRE CGM, document, and provide at future appointments ? ?Follow Up Plan: Telephone follow up appointment with care management team member scheduled for:  1 MONTH  ? ?Plan: Telephone follow up appointment with care management team member scheduled for:  1 MONTH ? ?Signature. ?Regina Eck, PharmD, BCPS ?Clinical Pharmacist, [facility] ?[place]  [place] Phone [phone] ? ? ?Please call the care guide team at [phone] if you need to cancel or reschedule your appointment.  ? ?The patient verbalized understanding of instructions, educational materials, and care plan provided today and declined offer to receive copy of patient instructions, educational materials, and care plan.  ? ?

## 2021-12-06 DIAGNOSIS — E119 Type 2 diabetes mellitus without complications: Secondary | ICD-10-CM | POA: Diagnosis not present

## 2021-12-19 DIAGNOSIS — E1169 Type 2 diabetes mellitus with other specified complication: Secondary | ICD-10-CM

## 2021-12-19 DIAGNOSIS — E119 Type 2 diabetes mellitus without complications: Secondary | ICD-10-CM

## 2021-12-19 DIAGNOSIS — E785 Hyperlipidemia, unspecified: Secondary | ICD-10-CM

## 2021-12-21 ENCOUNTER — Other Ambulatory Visit: Payer: Self-pay | Admitting: Family

## 2021-12-26 ENCOUNTER — Other Ambulatory Visit: Payer: Self-pay | Admitting: Family

## 2022-01-06 ENCOUNTER — Other Ambulatory Visit: Payer: Self-pay | Admitting: Family

## 2022-01-06 DIAGNOSIS — E1169 Type 2 diabetes mellitus with other specified complication: Secondary | ICD-10-CM

## 2022-01-07 DIAGNOSIS — E119 Type 2 diabetes mellitus without complications: Secondary | ICD-10-CM | POA: Diagnosis not present

## 2022-01-10 ENCOUNTER — Other Ambulatory Visit: Payer: Self-pay | Admitting: Nurse Practitioner

## 2022-01-10 DIAGNOSIS — I10 Essential (primary) hypertension: Secondary | ICD-10-CM

## 2022-01-24 ENCOUNTER — Encounter: Payer: Self-pay | Admitting: Family

## 2022-01-24 ENCOUNTER — Ambulatory Visit (INDEPENDENT_AMBULATORY_CARE_PROVIDER_SITE_OTHER): Payer: Medicare Other | Admitting: Family

## 2022-01-24 VITALS — BP 104/63 | HR 64 | Temp 98.0°F | Ht 68.0 in | Wt 174.0 lb

## 2022-01-24 DIAGNOSIS — K219 Gastro-esophageal reflux disease without esophagitis: Secondary | ICD-10-CM

## 2022-01-24 DIAGNOSIS — E1142 Type 2 diabetes mellitus with diabetic polyneuropathy: Secondary | ICD-10-CM | POA: Diagnosis not present

## 2022-01-24 DIAGNOSIS — J301 Allergic rhinitis due to pollen: Secondary | ICD-10-CM

## 2022-01-24 DIAGNOSIS — E1169 Type 2 diabetes mellitus with other specified complication: Secondary | ICD-10-CM

## 2022-01-24 DIAGNOSIS — K7460 Unspecified cirrhosis of liver: Secondary | ICD-10-CM | POA: Diagnosis not present

## 2022-01-24 DIAGNOSIS — F320 Major depressive disorder, single episode, mild: Secondary | ICD-10-CM

## 2022-01-24 DIAGNOSIS — E785 Hyperlipidemia, unspecified: Secondary | ICD-10-CM | POA: Diagnosis not present

## 2022-01-24 DIAGNOSIS — I10 Essential (primary) hypertension: Secondary | ICD-10-CM

## 2022-01-24 DIAGNOSIS — Z794 Long term (current) use of insulin: Secondary | ICD-10-CM | POA: Diagnosis not present

## 2022-01-24 LAB — BAYER DCA HB A1C WAIVED: HB A1C (BAYER DCA - WAIVED): 6.3 % — ABNORMAL HIGH (ref 4.8–5.6)

## 2022-01-24 MED ORDER — ATORVASTATIN CALCIUM 10 MG PO TABS: 10.0000 mg | ORAL_TABLET | Freq: Every day | ORAL | 1 refills | Status: DC

## 2022-01-24 MED ORDER — BD PEN NEEDLE MINI U/F 31G X 5 MM MISC: 3 refills | Status: AC

## 2022-01-24 MED ORDER — TRESIBA FLEXTOUCH 100 UNIT/ML ~~LOC~~ SOPN: PEN_INJECTOR | SUBCUTANEOUS | 1 refills | Status: DC

## 2022-01-24 MED ORDER — ACCU-CHEK GUIDE VI STRP: ORAL_STRIP | 3 refills | Status: DC

## 2022-01-24 MED ORDER — ESCITALOPRAM OXALATE 10 MG PO TABS: 10.0000 mg | ORAL_TABLET | Freq: Every day | ORAL | 1 refills | Status: DC

## 2022-01-24 MED ORDER — METFORMIN HCL ER 750 MG PO TB24: ORAL_TABLET | ORAL | 1 refills | Status: DC

## 2022-01-24 MED ORDER — FREESTYLE LIBRE 2 SENSOR MISC: 3 refills | Status: DC

## 2022-01-24 MED ORDER — SPIRONOLACTONE 50 MG PO TABS: 50.0000 mg | ORAL_TABLET | Freq: Every day | ORAL | 1 refills | Status: DC

## 2022-01-24 MED ORDER — CETIRIZINE HCL 10 MG PO TABS: 10.0000 mg | ORAL_TABLET | Freq: Every day | ORAL | 1 refills | Status: DC

## 2022-01-24 MED ORDER — DAPAGLIFLOZIN PROPANEDIOL 10 MG PO TABS: 10.0000 mg | ORAL_TABLET | Freq: Every day | ORAL | 1 refills | Status: DC

## 2022-01-24 MED ORDER — GABAPENTIN 800 MG PO TABS: 800.0000 mg | ORAL_TABLET | Freq: Three times a day (TID) | ORAL | 1 refills | Status: DC

## 2022-01-24 MED ORDER — DEXLANSOPRAZOLE 60 MG PO CPDR: 1.0000 | DELAYED_RELEASE_CAPSULE | Freq: Every day | ORAL | 1 refills | Status: DC

## 2022-01-24 NOTE — Progress Notes (Signed)
Subjective:    Patient ID: Thomas Pratt, male    DOB: Nov 07, 1960, 61 y.o.   MRN: [id number]  Chief Complaint  Patient presents with   Medical Management of Chronic Issues   Diabetes   Headache   Edema    Both feet and are sore   PT presents to the office today for chronic follow up. He is followed by our clinical pharmacists  for DM education. He uses libre continuous glucose monitoring which has helped decrease his A1C.    He is followed by GI  for cirrhosis of liver. He has been sober since 03/2016.    He is complaining of bilateral foot and leg pain when walking. States it is a numbing pain of 10 out 10  Diabetes He presents for his follow-up diabetic visit. He has type 2 diabetes mellitus. There are no hypoglycemic associated symptoms. Associated symptoms include blurred vision, fatigue and foot paresthesias. Symptoms are stable. Diabetic complications include a CVA and heart disease. Risk factors for coronary artery disease include dyslipidemia, diabetes mellitus, male sex, hypertension and sedentary lifestyle. He is following a generally healthy diet. His overall blood glucose range is 130-140 mg/dl. Eye exam is not current.  Headache  Associated symptoms include blurred vision. His past medical history is significant for hypertension and obesity.  Hyperlipidemia This is a chronic problem. The current episode started more than 1 year ago. The problem is controlled. Recent lipid tests were reviewed and are normal. Exacerbating diseases include obesity. Pertinent negatives include no shortness of breath. Current antihyperlipidemic treatment includes statins. The current treatment provides moderate improvement of lipids. Risk factors for coronary artery disease include dyslipidemia, diabetes mellitus, male sex, hypertension, a sedentary lifestyle and post-menopausal.  Hypertension This is a chronic problem. The current episode started more than 1 year ago. The problem has been  resolved since onset. The problem is controlled. Associated symptoms include blurred vision, malaise/fatigue and peripheral edema. Pertinent negatives include no shortness of breath. Risk factors for coronary artery disease include dyslipidemia, diabetes mellitus, obesity, male gender and sedentary lifestyle. The current treatment provides moderate improvement. Hypertensive end-organ damage includes CVA.  Depression        This is a chronic problem.  The current episode started more than 1 year ago.   The onset quality is gradual.   The problem occurs intermittently.  Associated symptoms include fatigue, helplessness, hopelessness, restlessness and sad.  Past treatments include SSRIs - Selective serotonin reuptake inhibitors.     Review of Systems  Constitutional:  Positive for fatigue and malaise/fatigue.  Eyes:  Positive for blurred vision.  Respiratory:  Negative for shortness of breath.   Psychiatric/Behavioral:  Positive for depression.   All other systems reviewed and are negative.      Objective:   Physical Exam Vitals reviewed.  Constitutional:      General: He is not in acute distress.    Appearance: He is well-developed. He is obese.  HENT:     Head: Normocephalic.     Right Ear: External ear normal.     Left Ear: External ear normal.  Eyes:     General:        Right eye: No discharge.        Left eye: No discharge.     Pupils: Pupils are equal, round, and reactive to light.  Neck:     Thyroid: No thyromegaly.  Cardiovascular:     Rate and Rhythm: Normal rate and regular rhythm.  Heart sounds: Normal heart sounds. No murmur heard. Pulmonary:     Effort: Pulmonary effort is normal. No respiratory distress.     Breath sounds: Normal breath sounds. No wheezing.  Abdominal:     General: Bowel sounds are normal. There is no distension.     Palpations: Abdomen is soft.     Tenderness: There is no abdominal tenderness.  Musculoskeletal:        General: No tenderness.  Normal range of motion.     Cervical back: Normal range of motion and neck supple.  Skin:    General: Skin is warm and dry.     Findings: No erythema or rash.  Neurological:     Mental Status: He is alert and oriented to person, place, and time.     Cranial Nerves: No cranial nerve deficit.     Deep Tendon Reflexes: Reflexes are normal and symmetric.  Psychiatric:        Behavior: Behavior normal.        Thought Content: Thought content normal.        Judgment: Judgment normal.        BP 104/63   Pulse 64   Temp 98 F (36.7 C)   Ht $R'5\' 8"'QR$  (1.727 m)   Wt 174 lb (78.9 kg)   SpO2 96%   BMI 26.46 kg/m   Assessment & Plan:  Thomas Pratt comes in today with chief complaint of Medical Management of Chronic Issues, Diabetes, Headache, and Edema (Both feet and are sore)   Diagnosis and orders addressed:  1. Type 2 diabetes mellitus with other specified complication, without long-term current use of insulin (HCC) - Bayer DCA Hb A1c Waived - atorvastatin (LIPITOR) 10 MG tablet; Take 1 tablet (10 mg total) by mouth daily.  Dispense: 90 tablet; Refill: 1 - insulin degludec (TRESIBA FLEXTOUCH) 100 UNIT/ML FlexTouch Pen; INJECT 25 UNITS SUBCUTANEOUSLY AT BEDTIME  Dispense: 15 mL; Refill: 1 - CMP14+EGFR - Microalbumin / creatinine urine ratio  2. Hyperlipidemia associated with type 2 diabetes mellitus (HCC) - atorvastatin (LIPITOR) 10 MG tablet; Take 1 tablet (10 mg total) by mouth daily.  Dispense: 90 tablet; Refill: 1 - CMP14+EGFR  3. Allergic rhinitis due to pollen, unspecified seasonality - cetirizine (ZYRTEC) 10 MG tablet; Take 1 tablet (10 mg total) by mouth daily.  Dispense: 90 tablet; Refill: 1 - CMP14+EGFR  4. Gastroesophageal reflux disease, unspecified whether esophagitis present - dexlansoprazole (DEXILANT) 60 MG capsule; Take 1 capsule (60 mg total) by mouth daily.  Dispense: 90 capsule; Refill: 1 - CMP14+EGFR  5. Depression, major, single episode, mild (HCC) -  escitalopram (LEXAPRO) 10 MG tablet; Take 1 tablet (10 mg total) by mouth daily.  Dispense: 90 tablet; Refill: 1 - CMP14+EGFR  6. Type 2 diabetes mellitus with other specified complication, with long-term current use of insulin (HCC) - CMP14+EGFR  7. Diabetic polyneuropathy associated with type 2 diabetes mellitus (HCC) Will increase Gabapentin to 800 mg from 600 mg TID - gabapentin (NEURONTIN) 800 MG tablet; Take 1 tablet (800 mg total) by mouth 3 (three) times daily.  Dispense: 270 tablet; Refill: 1 - CMP14+EGFR  8. Type 2 diabetes mellitus with other specified complication, unspecified whether long term insulin use (HCC) - Insulin Pen Needle (B-D UF III MINI PEN NEEDLES) 31G X 5 MM MISC; USE WITH TRESIBA DAILY  Dispense: 100 each; Refill: 3 - metFORMIN (GLUCOPHAGE-XR) 750 MG 24 hr tablet; Take 2 tablets by mouth once daily with breakfast  Dispense: 180 tablet; Refill: 1 -  insulin degludec (TRESIBA FLEXTOUCH) 100 UNIT/ML FlexTouch Pen; INJECT 25 UNITS SUBCUTANEOUSLY AT BEDTIME  Dispense: 15 mL; Refill: 1 - CMP14+EGFR  9. Essential hypertension - spironolactone (ALDACTONE) 50 MG tablet; Take 1 tablet (50 mg total) by mouth daily.  Dispense: 90 tablet; Refill: 1 - CMP14+EGFR  10. Primary hypertension - CMP14+EGFR  11. Cirrhosis of liver without ascites, unspecified hepatic cirrhosis type ([place])  - CMP14+EGFR   Labs pending Health Maintenance reviewed Diet and exercise encouraged  Follow up plan: 3 months    Evelina Dun, FNP

## 2022-01-24 NOTE — Patient Instructions (Signed)
Neuropathic Pain Neuropathic pain is pain caused by damage to the nerves that are responsible for certain sensations in your body (sensory nerves). Neuropathic pain can make you more sensitive to pain. Even a minor sensation can feel very painful. This is usually a long-term (chronic) condition that can be difficult to treat. The type of pain differs from person to person. It may: Start suddenly (acute), or it may develop slowly and become chronic. Come and go as damaged nerves heal, or it may stay at the same level for years. Cause emotional distress, loss of sleep, and a lower quality of life. What are the causes? The most common cause of this condition is diabetes. Many other diseases and conditions can also cause neuropathic pain. Causes of neuropathic pain can be classified as: Toxic. This is caused by medicines and chemicals. The most common causes of toxic neuropathic pain is damage from medicines that kill cancer cells (chemotherapy) or alcohol abuse. Metabolic. This can be caused by: Diabetes. Lack of vitamins like B12. Traumatic. Any injury that cuts, crushes, or stretches a nerve can cause damage and pain. Compression-related. If a sensory nerve gets trapped or compressed for a long period of time, the blood supply to the nerve can be cut off. Vascular. Many blood vessel diseases can cause neuropathic pain by decreasing blood supply and oxygen to nerves. Autoimmune. This type of pain results from diseases in which the body's defense system (immune system) mistakenly attacks sensory nerves. Examples of autoimmune diseases that can cause neuropathic pain include lupus and multiple sclerosis. Infectious. Many types of viral infections can damage sensory nerves and cause pain. Shingles infection is a common cause of this type of pain. Inherited. Neuropathic pain can be a symptom of many diseases that are passed down through families (genetic). What increases the risk? You are more likely to  develop this condition if: You have diabetes. You smoke. You drink too much alcohol. You are taking certain medicines, including chemotherapy or medicines that treat immune system disorders. What are the signs or symptoms? The main symptom is pain. Neuropathic pain is often described as: Burning. Shock-like. Stinging. Hot or cold. Itching. How is this diagnosed? No single test can diagnose neuropathic pain. It is diagnosed based on: A physical exam and your symptoms. Your health care provider will ask you about your pain. You may be asked to use a pain scale to describe how bad your pain is. Tests. These may be done to see if you have a cause and location of any nerve damage. They include: Nerve conduction studies and electromyography to test how well nerve signals travel through your nerves and muscles (electrodiagnostic testing). Skin biopsy to evaluate for small fiber neuropathy. Imaging studies, such as: X-rays. CT scan. MRI. How is this treated? Treatment for neuropathic pain may change over time. You may need to try different treatment options or a combination of treatments. Some options include: Treating the underlying cause of the neuropathy, such as diabetes, kidney disease, or vitamin deficiencies. Stopping medicines that can cause neuropathy, such as chemotherapy. Medicine to relieve pain. Medicines may include: Prescription or over-the-counter pain medicine. Anti-seizure medicine. Antidepressant medicines. Pain-relieving patches or creams that are applied to painful areas of skin. A medicine to numb the area (local anesthetic), which can be injected as a nerve block. Transcutaneous nerve stimulation. This uses electrical currents to block painful nerve signals. The treatment is painless. Alternative treatments, such as: Acupuncture. Meditation. Massage. Occupational or physical therapy. Pain management programs. Counseling. Follow   these instructions at  home: Medicines  Take over-the-counter and prescription medicines only as told by your health care provider. Ask your health care provider if the medicine prescribed to you: Requires you to avoid driving or using machinery. Can cause constipation. You may need to take these actions to prevent or treat constipation: Drink enough fluid to keep your urine pale yellow. Take over-the-counter or prescription medicines. Eat foods that are high in fiber, such as beans, whole grains, and fresh fruits and vegetables. Limit foods that are high in fat and processed sugars, such as fried or sweet foods. Lifestyle  Have a good support system at home. Consider joining a chronic pain support group. Do not use any products that contain nicotine or tobacco. These products include cigarettes, chewing tobacco, and vaping devices, such as e-cigarettes. If you need help quitting, ask your health care provider. Do not drink alcohol. General instructions Learn as much as you can about your condition. Work closely with all your health care providers to find the treatment plan that works best for you. Ask your health care provider what activities are safe for you. Keep all follow-up visits. This is important. Contact a health care provider if: Your pain treatments are not working. You are having side effects from your medicines. You are struggling with tiredness (fatigue), mood changes, depression, or anxiety. Get help right away if: You have thoughts of hurting yourself. Get help right away if you feel like you may hurt yourself or others, or have thoughts about taking your own life. Go to your nearest emergency room or: Call 911. Call the National Suicide Prevention Lifeline at 1-800-273-8255 or 988. This is open 24 hours a day. Text the Crisis Text Line at 741741. Summary Neuropathic pain is pain caused by damage to the nerves that are responsible for certain sensations in your body (sensory  nerves). Neuropathic pain may come and go as damaged nerves heal, or it may stay at the same level for years. Neuropathic pain is usually a long-term condition that can be difficult to treat. Consider joining a chronic pain support group. This information is not intended to replace advice given to you by your health care provider. Make sure you discuss any questions you have with your health care provider. Document Revised: 03/05/2021 Document Reviewed: 03/05/2021 Elsevier Patient Education  2023 Elsevier Inc.  

## 2022-01-25 LAB — MICROALBUMIN / CREATININE URINE RATIO
Creatinine, Urine: 53.8 mg/dL
Microalb/Creat Ratio: <6 mg/g creat (ref 0–29)
Microalbumin, Urine: <3 ug/mL

## 2022-01-25 LAB — CMP14+EGFR
ALT: 43 IU/L (ref 0–44)
AST: 36 IU/L (ref 0–40)
Albumin/Globulin Ratio: 1.7 (ref 1.2–2.2)
Albumin: 4.7 g/dL (ref 3.8–4.8)
Alkaline Phosphatase: 63 IU/L (ref 44–121)
BUN/Creatinine Ratio: 13 (ref 10–24)
BUN: 12 mg/dL (ref 8–27)
Bilirubin Total: 0.7 mg/dL (ref 0.0–1.2)
CO2: 25 mmol/L (ref 20–29)
Calcium: 10.3 mg/dL — ABNORMAL HIGH (ref 8.6–10.2)
Chloride: 99 mmol/L (ref 96–106)
Creatinine, Ser: 0.91 mg/dL (ref 0.76–1.27)
Globulin, Total: 2.7 g/dL (ref 1.5–4.5)
Glucose: 110 mg/dL — ABNORMAL HIGH (ref 70–99)
Potassium: 4.9 mmol/L (ref 3.5–5.2)
Sodium: 137 mmol/L (ref 134–144)
Total Protein: 7.4 g/dL (ref 6.0–8.5)
eGFR: 95 mL/min/{1.73_m2} (ref >59)

## 2022-01-31 ENCOUNTER — Encounter: Payer: Self-pay | Admitting: *Deleted

## 2022-02-07 DIAGNOSIS — E119 Type 2 diabetes mellitus without complications: Secondary | ICD-10-CM | POA: Diagnosis not present

## 2022-02-18 ENCOUNTER — Other Ambulatory Visit (HOSPITAL_COMMUNITY)
Admission: RE | Admit: 2022-02-18 | Discharge: 2022-02-18 | Disposition: A | Discharge status: Home / Self Care | Payer: Medicare Other | Source: Ambulatory Visit | Attending: Gastroenterology | Admitting: Gastroenterology

## 2022-02-18 ENCOUNTER — Ambulatory Visit (INDEPENDENT_AMBULATORY_CARE_PROVIDER_SITE_OTHER): Payer: Medicare Other | Admitting: Gastroenterology

## 2022-02-18 ENCOUNTER — Other Ambulatory Visit: Payer: Self-pay

## 2022-02-18 ENCOUNTER — Encounter: Payer: Self-pay | Admitting: Gastroenterology

## 2022-02-18 VITALS — BP 109/70 | HR 55 | Temp 97.7°F | Ht 68.0 in | Wt 170.2 lb

## 2022-02-18 DIAGNOSIS — R634 Abnormal weight loss: Secondary | ICD-10-CM

## 2022-02-18 DIAGNOSIS — K7030 Alcoholic cirrhosis of liver without ascites: Secondary | ICD-10-CM | POA: Insufficient documentation

## 2022-02-18 DIAGNOSIS — R1032 Left lower quadrant pain: Secondary | ICD-10-CM | POA: Diagnosis not present

## 2022-02-18 DIAGNOSIS — R5383 Other fatigue: Secondary | ICD-10-CM

## 2022-02-18 DIAGNOSIS — R350 Frequency of micturition: Secondary | ICD-10-CM | POA: Diagnosis not present

## 2022-02-18 DIAGNOSIS — K219 Gastro-esophageal reflux disease without esophagitis: Secondary | ICD-10-CM | POA: Insufficient documentation

## 2022-02-18 LAB — LIPASE, BLOOD: Lipase: 41 U/L (ref 11–51)

## 2022-02-18 LAB — CBC WITH DIFFERENTIAL/PLATELET
Abs Immature Granulocytes: 0.01 10*3/uL (ref 0.00–0.07)
Basophils Absolute: 0 10*3/uL (ref 0.0–0.1)
Basophils Relative: 0 %
Eosinophils Absolute: 0.1 10*3/uL (ref 0.0–0.5)
Eosinophils Relative: 2 %
HCT: 50.4 % (ref 39.0–52.0)
Hemoglobin: 17.8 g/dL — ABNORMAL HIGH (ref 13.0–17.0)
Immature Granulocytes: 0 %
Lymphocytes Relative: 38 %
Lymphs Abs: 2.9 10*3/uL (ref 0.7–4.0)
MCH: 34 pg (ref 26.0–34.0)
MCHC: 35.3 g/dL (ref 30.0–36.0)
MCV: 96.2 fL (ref 80.0–100.0)
Monocytes Absolute: 0.4 10*3/uL (ref 0.1–1.0)
Monocytes Relative: 6 %
Neutro Abs: 4 10*3/uL (ref 1.7–7.7)
Neutrophils Relative %: 54 %
Platelets: 174 10*3/uL (ref 150–400)
RBC: 5.24 MIL/uL (ref 4.22–5.81)
RDW: 12.3 % (ref 11.5–15.5)
WBC: 7.5 10*3/uL (ref 4.0–10.5)
nRBC: 0 % (ref 0.0–0.2)

## 2022-02-18 LAB — BILIRUBIN, DIRECT: Bilirubin, Direct: 0.3 mg/dL — ABNORMAL HIGH (ref 0.0–0.2)

## 2022-02-18 LAB — URINALYSIS, ROUTINE W REFLEX MICROSCOPIC
Bacteria, UA: NONE SEEN
Bilirubin Urine: NEGATIVE
Glucose, UA: >=500 mg/dL — AB
Hgb urine dipstick: NEGATIVE
Ketones, ur: NEGATIVE mg/dL
Leukocytes,Ua: NEGATIVE
Nitrite: NEGATIVE
Protein, ur: NEGATIVE mg/dL
Specific Gravity, Urine: 1.02 (ref 1.005–1.030)
pH: 5 (ref 5.0–8.0)

## 2022-02-18 LAB — COMPREHENSIVE METABOLIC PANEL
ALT: 41 U/L (ref 0–44)
AST: 35 U/L (ref 15–41)
Albumin: 5 g/dL (ref 3.5–5.0)
Alkaline Phosphatase: 52 U/L (ref 38–126)
Anion gap: 10 (ref 5–15)
BUN: 16 mg/dL (ref 8–23)
CO2: 28 mmol/L (ref 22–32)
Calcium: 9.9 mg/dL (ref 8.9–10.3)
Chloride: 97 mmol/L — ABNORMAL LOW (ref 98–111)
Creatinine, Ser: 1.25 mg/dL — ABNORMAL HIGH (ref 0.61–1.24)
GFR, Estimated: >60 mL/min (ref >60)
Glucose, Bld: 136 mg/dL — ABNORMAL HIGH (ref 70–99)
Potassium: 4.2 mmol/L (ref 3.5–5.1)
Sodium: 135 mmol/L (ref 135–145)
Total Bilirubin: 1.7 mg/dL — ABNORMAL HIGH (ref 0.3–1.2)
Total Protein: 9 g/dL — ABNORMAL HIGH (ref 6.5–8.1)

## 2022-02-18 LAB — LIPID PANEL
Cholesterol: 162 mg/dL (ref 0–200)
HDL: 40 mg/dL — ABNORMAL LOW (ref >40)
LDL Cholesterol: 105 mg/dL — ABNORMAL HIGH (ref 0–99)
Total CHOL/HDL Ratio: 4.1 RATIO
Triglycerides: 86 mg/dL (ref <150)
VLDL: 17 mg/dL (ref 0–40)

## 2022-02-18 LAB — TSH: TSH: 1.786 u[IU]/mL (ref 0.350–4.500)

## 2022-02-18 LAB — T4, FREE: Free T4: 0.83 ng/dL (ref 0.61–1.12)

## 2022-02-18 LAB — PROTIME-INR
INR: 1.1 (ref 0.8–1.2)
Prothrombin Time: 13.7 seconds (ref 11.4–15.2)

## 2022-02-18 NOTE — Patient Instructions (Signed)
Please have labs and urine test done at [facility]. We will be in touch with results.  Once we sort out your abdominal pain, we will work towards updating liver imaging and colonoscopy.

## 2022-02-18 NOTE — Progress Notes (Signed)
GI Office Note    Referring Provider: Sharion Balloon, FNP Primary Care Physician:  Thomas Balloon, FNP  Primary Gastroenterologist: Thomas Cornea, MD   Chief Complaint   Chief Complaint  Patient presents with   Abdominal Pain    Left lower down near groin   Colonoscopy    Last colonoscopy was 03/07/2017 with Dr. Gala Pratt.     History of Present Illness   Thomas Pratt is a 61 y.o. male presenting today for follow-up.  Last seen in January.  He has a history of cirrhosis presumably due to prior alcohol abuse (sober since 2017) however, he had positive AMA, AMA, ASMA (on repeat negative), elevated IgG, liver biopsy with granulomatous hepatitis. [facility]uated by [facility].  Labs repeated and they reviewed liver biopsy slides and suspected cirrhosis secondary to alcohol use but did have hepatic sarcoidosis.  Pulmonary work-up negative for lung involvement.  Thomas Pratt was stopped by [facility] liver.  Advised to complete 6 months of substance abuse counseling and return if liver disease decompensates.  Due for surveillance colonoscopy for history of tubular adenomas, last colonoscopy in 2018.  History of upper GI bleed in June 2021, EGD showed innocent appearing and barely grade 2 esophageal varices, mild portal hypertensive gastropathy, no evidence of bleeding from upper GI tract.  He has been on nadolol 40 mg daily.  Also on Xifaxan 550 mg twice daily and low-dose diuretics.  Today: He is due for hepatoma screening and updated labs.  Historically his MELD sodium has been low, 7 back in February.  He completed hepatitis B vaccinations with documented immunity.  Chronically deals with abdominal pain, particularly left upper quadrant and right lower quadrant.  Chronic foot pain, increased gabapentin to 800 mg 3 times daily recently.  After last office visit he completed CT abdomen and pelvis with contrast to evaluate right lower quadrant pain.  There was no evidence of appendicitis  or recurrent inguinal hernia.  He had stable small umbilical hernia containing fat only.  He presents now with 3-day history of left lower quadrant pain, radiates into the groin.  Associated with feeling fatigued, some nausea but no vomiting.  No fever.  Noted increased urinary frequency but no burning with urination.  No heartburn.  Has chronic left upper quadrant and right lower quadrant pain are less of a nuisance.  He is lost 8 more pounds in the past 3 months (total of 15 pounds in the past year), has had to move his belt buckle 2 more spots to make belt tighter.  Complains of sleeping all day long, usually gets up around 2:30 in the morning and cannot go back to sleep.  Intermittent occasional dizziness.  Occasionally feels like his hands do not have any strength.  Denies confusion.    Labs from July 2023: Total bilirubin 0.7, alkaline phosphatase 63, AST 36, ALT 43, albumin 4.7, A1c 6.3 Last calculated MELD sodium from February 2023 was 7.    Medications   Current Outpatient Medications  Medication Sig Dispense Refill   Accu-Chek Softclix Lancets lancets Test BS 4 (four) times daily. Dx E11.69 (Patient taking differently: 1 each by Other route 4 (four) times daily as needed. Test BS 4 (four) times daily. Dx E11.69) 400 each 3   atorvastatin (LIPITOR) 10 MG tablet Take 1 tablet (10 mg total) by mouth daily. 90 tablet 1   Blood Glucose Monitoring Suppl (ACCU-CHEK GUIDE) w/Device KIT Test BS 4 (four) times daily. Dx E11.69 1 kit  0   cetirizine (ZYRTEC) 10 MG tablet Take 1 tablet (10 mg total) by mouth daily. 90 tablet 1   Continuous Blood Gluc Sensor (FREESTYLE LIBRE 2 SENSOR) MISC Use to test blood sugars 6x daily as directed 2 each 3   dapagliflozin propanediol (FARXIGA) 10 MG TABS tablet Take 1 tablet (10 mg total) by mouth daily before breakfast. 90 tablet 1   dexlansoprazole (DEXILANT) 60 MG capsule Take 1 capsule (60 mg total) by mouth daily. 90 capsule 1   diclofenac Sodium (VOLTAREN) 1  % GEL Apply 2 g topically 4 (four) times daily. 100 g 0   escitalopram (LEXAPRO) 10 MG tablet Take 1 tablet (10 mg total) by mouth daily. 90 tablet 1   fluticasone (FLONASE) 50 MCG/ACT nasal spray Use 2 spray(s) in each nostril once daily 16 g 5   furosemide (LASIX) 20 MG tablet Take 1 tablet by mouth once daily 90 tablet 3   gabapentin (NEURONTIN) 800 MG tablet Take 1 tablet (800 mg total) by mouth 3 (three) times daily. 270 tablet 1   glucose blood (ACCU-CHEK GUIDE) test strip Test BS 4 (four) times daily. Dx E11.69 400 each 3   insulin degludec (TRESIBA FLEXTOUCH) 100 UNIT/ML FlexTouch Pen INJECT 25 UNITS SUBCUTANEOUSLY AT BEDTIME 15 mL 1   Insulin Pen Needle (B-D UF III MINI PEN NEEDLES) 31G X 5 MM MISC USE WITH TRESIBA DAILY 100 each 3   metFORMIN (GLUCOPHAGE-XR) 750 MG 24 hr tablet Take 2 tablets by mouth once daily with breakfast 180 tablet 1   nadolol (CORGARD) 40 MG tablet Take 1 tablet by mouth once daily 90 tablet 3   rifaximin (XIFAXAN) 550 MG TABS tablet Take 1 tablet (550 mg total) by mouth 2 (two) times daily. 180 tablet 3   spironolactone (ALDACTONE) 50 MG tablet Take 1 tablet (50 mg total) by mouth daily. 90 tablet 1   VITAMIN D PO Take 1 capsule by mouth daily.      No current facility-administered medications for this visit.    Allergies   Allergies as of 02/18/2022   (No Known Allergies)    Past Medical History   Past Medical History:  Diagnosis Date   Cirrhosis ([place])    likely ETOH use    Diabetes ([place])    Heat stroke 2016/17    Past Surgical History   Past Surgical History:  Procedure Laterality Date   CHOLECYSTECTOMY N/A 09/01/2017   Procedure: LAPAROSCOPIC CHOLECYSTECTOMY;  Surgeon: Thomas Signs, MD;  Location: [facility];  Service: General;  Laterality: N/A;   COLONOSCOPY WITH PROPOFOL N/A 03/06/2017   Dr. Gala Pratt: tubular adenoma removed, next TCS 5 years if health permits.    ESOPHAGOGASTRODUODENOSCOPY (EGD) WITH PROPOFOL N/A 03/06/2017   Dr. Gala Pratt: grade  1 esophageal varices, erosive esophagitis, portal gastropathy.    ESOPHAGOGASTRODUODENOSCOPY (EGD) WITH PROPOFOL N/A 05/03/2019   Procedure: ESOPHAGOGASTRODUODENOSCOPY (EGD) WITH PROPOFOL;  Surgeon: Daneil Dolin, MD;  Location: [facility];  Service: Endoscopy;  Laterality: N/A;  10:30am   ESOPHAGOGASTRODUODENOSCOPY (EGD) WITH PROPOFOL N/A 12/23/2019   Procedure: ESOPHAGOGASTRODUODENOSCOPY (EGD) WITH PROPOFOL Possible esophageal variceal banding.;  Surgeon: Daneil Dolin, MD;  [facility]: [facility];  Service: Endoscopy;  Laterality: N/A;   HERNIA REPAIR Right    RIH   INGUINAL HERNIA REPAIR Right 07/11/2017   Procedure: HERNIA REPAIR INGUINAL ADULT WITH MESH;  Surgeon: Thomas Signs, MD;  Location: [facility];  Service: General;  Laterality: Right;  patient knows to arrive at 7:30   IR  TRANSCATHETER BX  10/14/2017   IR US GUIDE VASC ACCESS RIGHT  10/14/2017   IR VENOGRAM HEPATIC WO HEMODYNAMIC EVALUATION  10/14/2017   MALONEY DILATION N/A 05/03/2019   Procedure: Venia Minks DILATION;  Surgeon: Daneil Dolin, MD;  Location: [facility];  Service: Endoscopy;  Laterality: N/A;   POLYPECTOMY  03/06/2017   Procedure: POLYPECTOMY;  Surgeon: Daneil Dolin, MD;  Location: [facility];  Service: Endoscopy;;  ascending colon;    Past Family History   Family History  Problem Relation Age of Onset   COPD Mother    Liver disease Neg Hx    Colon cancer Neg Hx    Colon polyps Neg Hx     Past Social History   Social History   Socioeconomic History   Marital status: Divorced    Spouse name: Not on file   Number of children: Not on file   Years of education: Not on file   Highest education level: Not on file  Occupational History   Occupation: unemployed    Comment: disability  Tobacco Use   Smoking status: Never   Smokeless tobacco: Never  Vaping Use   Vaping Use: Never used  Substance and Sexual Activity   Alcohol use: No    Comment: Quit September 2017, heavy alcohol abuse  previously   Drug use: No   Sexual activity: Not Currently    Partners: Female  Other Topics Concern   Not on file  Social History Narrative   Lives in [place], [place] with his youngest daughter; 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    Drank alcohol for years, quit 05/07/16. Has been sober since then. Quit b/c of health/cirrhosis.   Divorced for over 15 years.   Dating sporadically.   Has 3 daughters and 1 son live locally.   Originally from [place], moved here b/c of wife.    Eats all foods.    Wear seatbelt.   Does not drive, does not have a license, lost due to DWI.    Brought by neighbor.    Does not go to church. Believes in God. Prays each night, per report.    Social Determinants of Health   Financial Resource Strain: Low Risk  (11/14/2021)   Overall Financial Resource Strain (CARDIA)    Difficulty of Paying Living Expenses: Not hard at all  Food Insecurity: No Food Insecurity (11/14/2021)   Hunger Vital Sign    Worried About Running Out of Food in the Last Year: Never true    Ran Out of Food in the Last Year: Never true  Transportation Needs: No Transportation Needs (11/14/2021)   PRAPARE - Hydrologist (Medical): No    Lack of Transportation (Non-Medical): No  Physical Activity: Sufficiently Active (11/14/2021)   Exercise Vital Sign    Days of Exercise per Week: 5 days    Minutes of Exercise per Session: 30 min  Stress: No Stress Concern Present (11/14/2021)   [place]    Feeling of Stress : Only a little  Social Connections: Socially Isolated (11/14/2021)   Social Connection and Isolation Panel [NHANES]    Frequency of Communication with Friends and Family: More than three times a week    Frequency of Social Gatherings with Friends and Family: More than three times a week    Attends Religious Services: Never    Marine scientist or Organizations: No  Attends Archivist Meetings: Never    Marital Status: Divorced  Human resources officer Violence: Not At Risk (11/14/2021)   Humiliation, Afraid, Rape, and Kick questionnaire    Fear of Current or Ex-Partner: No    Emotionally Abused: No    Physically Abused: No    Sexually Abused: No    Review of Systems   General: Negative for anorexia,  fever, chills, weakness. See hpi Eyes: Negative for vision changes.  ENT: Negative for hoarseness, difficulty swallowing , nasal congestion. CV: Negative for chest pain, angina, palpitations, dyspnea on exertion, +peripheral edema.  Respiratory: Negative for dyspnea at rest, dyspnea on exertion, cough, sputum, wheezing.  GI: See history of present illness. GU:  Negative for dysuria, hematuria, urinary incontinence, nocturnal urination. See hpi MS: Negative for joint pain, low back pain. Foot pain bilaterally. Derm: Negative for rash or itching.  Neuro: Negative for weakness, abnormal sensation, seizure, frequent headaches, memory loss,  confusion.  Psych: Negative for anxiety, depression, suicidal ideation, hallucinations.  Endo: Negative for unusual weight change.  Heme: Negative for bruising or bleeding. Allergy: Negative for rash or hives.  Physical Exam   BP 109/70 (BP Location: Left Arm, Patient Position: Sitting, Cuff Size: Normal)   Pulse (!) 55   Temp 97.7 F (36.5 C) (Temporal)   Ht '5\' 8"'  (1.727 m)   Wt 170 lb 3.2 oz (77.2 kg)   SpO2 97%   BMI 25.88 kg/m    General: Well-nourished, well-developed in no acute distress. Appears to feel unwell.  Head: Normocephalic, atraumatic.   Eyes: Conjunctiva pink, no icterus. Mouth: Oropharyngeal mucosa moist and pink , no lesions erythema or exudate. Neck: Supple without thyromegaly, masses, or lymphadenopathy.  Lungs: Clear to auscultation bilaterally.  Heart: Regular rate and rhythm, no murmurs rubs or gallops.  Abdomen: Bowel sounds are normal, nondistended, no hepatosplenomegaly or  masses,  no abdominal bruits, no rebound or guarding.  Small umb hernia nontender, easily reducible. Llq tenderness. Rectal: not performed Extremities: trace bilateral lower extremity edema. No clubbing or deformities.  Neuro: Alert and oriented x 4 , grossly normal neurologically.  Skin: Warm and dry, no rash or jaundice.   Psych: Alert and cooperative, normal mood and affect.  Labs    See hpi  Imaging Studies   No results found.  Assessment   Cirrhosis: With prior alcohol abuse, sober since 2017.  Granulomatous hepatitis on prior liver biopsy, positive ANA/AMA/IgG previously.  Has been seen at [facility] as outlined above.  He is immune to hepatitis B, completed vaccinations.  Has been maintained on nadolol 40 mg daily (rate to esophageal varices), Xifaxan 550 mg twice daily, furosemide 20 mg daily, spironolactone 50 mg daily.  Has overall been well compensated.  He reports ongoing alcohol rehab meetings.  He is due for labs and hepatoma screening.  Abdominal pain: Chronic intermittent abdominal pain over the years.  In the past 5 years he has had his gallbladder removed as well as right inguinal hernia repair.  Last visit he had recurrent right lower quadrant pain which was evaluated by CT as outlined.  Today complains mostly of 3-day history of left lower quadrant pain, unrelated to bowels.  Some increased urinary frequency.  No fever.  No abnormality noted on exam such as hernia.  He is due for surveillance colonoscopy at this time but need to work-up abdominal pain first.  History of tubular adenomas: Due for colonoscopy at this time pending labs and ultrasound.  GERD: Well-controlled.   PLAN  Labs, urine.   Continue current medications.   Once his abdominal pain has been evaluated, we will work towards colonoscopy and hepatoma screening.     Laureen Ochs. Bobby Rumpf, [place], [facility]

## 2022-02-19 LAB — AFP TUMOR MARKER: AFP, Serum, Tumor Marker: 2.1 ng/mL (ref 0.0–8.4)

## 2022-02-20 LAB — URINE CULTURE: Culture: NO GROWTH

## 2022-02-21 ENCOUNTER — Other Ambulatory Visit: Payer: Self-pay

## 2022-02-21 DIAGNOSIS — K7030 Alcoholic cirrhosis of liver without ascites: Secondary | ICD-10-CM

## 2022-02-21 DIAGNOSIS — R1032 Left lower quadrant pain: Secondary | ICD-10-CM

## 2022-02-25 ENCOUNTER — Telehealth: Payer: Self-pay | Admitting: Internal Medicine

## 2022-02-25 NOTE — Telephone Encounter (Signed)
Pt returning call/ [phone]

## 2022-02-25 NOTE — Telephone Encounter (Signed)
Questions for the nurse about labs. [phone]

## 2022-02-25 NOTE — Telephone Encounter (Signed)
Lmom for pt to return my call.  

## 2022-02-25 NOTE — Telephone Encounter (Signed)
Call unable to be completed as dialed.

## 2022-02-26 ENCOUNTER — Other Ambulatory Visit (HOSPITAL_COMMUNITY)
Admission: RE | Admit: 2022-02-26 | Discharge: 2022-02-26 | Disposition: A | Discharge status: Home / Self Care | Payer: Medicare Other | Source: Ambulatory Visit | Attending: Gastroenterology | Admitting: Gastroenterology

## 2022-02-26 DIAGNOSIS — K7030 Alcoholic cirrhosis of liver without ascites: Secondary | ICD-10-CM | POA: Insufficient documentation

## 2022-02-26 DIAGNOSIS — R1032 Left lower quadrant pain: Secondary | ICD-10-CM | POA: Insufficient documentation

## 2022-02-26 LAB — COMPREHENSIVE METABOLIC PANEL
ALT: 41 U/L (ref 0–44)
AST: 36 U/L (ref 15–41)
Albumin: 4.2 g/dL (ref 3.5–5.0)
Alkaline Phosphatase: 38 U/L (ref 38–126)
Anion gap: 7 (ref 5–15)
BUN: 10 mg/dL (ref 8–23)
CO2: 28 mmol/L (ref 22–32)
Calcium: 9.7 mg/dL (ref 8.9–10.3)
Chloride: 101 mmol/L (ref 98–111)
Creatinine, Ser: 0.85 mg/dL (ref 0.61–1.24)
GFR, Estimated: >60 mL/min (ref >60)
Glucose, Bld: 146 mg/dL — ABNORMAL HIGH (ref 70–99)
Potassium: 4.7 mmol/L (ref 3.5–5.1)
Sodium: 136 mmol/L (ref 135–145)
Total Bilirubin: 0.8 mg/dL (ref 0.3–1.2)
Total Protein: 7.6 g/dL (ref 6.5–8.1)

## 2022-02-26 NOTE — Telephone Encounter (Signed)
Number not in service.

## 2022-04-01 ENCOUNTER — Telehealth: Payer: Self-pay | Admitting: Gastroenterology

## 2022-04-01 NOTE — Telephone Encounter (Signed)
He needs to be reached at the mobile number

## 2022-04-01 NOTE — Telephone Encounter (Signed)
LSL asked that patient be scheduled for a virtual visit.  I have tried to call multiple times at [phone] but it says "call can't be completed as dialed".

## 2022-04-01 NOTE — Telephone Encounter (Signed)
Can we send him a letter?

## 2022-04-03 DIAGNOSIS — E119 Type 2 diabetes mellitus without complications: Secondary | ICD-10-CM | POA: Diagnosis not present

## 2022-04-14 NOTE — Progress Notes (Signed)
GI Office Note    Referring Provider: Sharion Balloon, FNP Primary Care Physician:  Sharion Balloon, FNP  Primary Gastroenterologist:  Chief Complaint   Chief Complaint  Patient presents with   Follow-up    Still having abdominal pain    History of Present Illness   Thomas Pratt is a 61 y.o. male presenting today for follow up abdominal pain. He was last seen in 01/2022. He has history cirrhosis presumably due to prior alcohol abuse (sober since 2017) however, he had positive AMA, AMA, ASMA (on repeat negative), elevated IgG, liver biopsy with granulomatous hepatitis. Evaluated by [facility] liver transplant center.  Labs repeated and they reviewed liver biopsy slides and suspected cirrhosis secondary to alcohol use but did have hepatic sarcoidosis.  Pulmonary work-up negative for lung involvement.  Thomas Pratt was stopped by [facility] liver.  Advised to complete 6 months of substance abuse counseling and return if liver disease decompensates.  History of upper GI bleed in June 2021, EGD showed innocent appearing and barely grade 2 esophageal varices, mild portal hypertensive gastropathy, no evidence of bleeding from upper GI tract.  He has been on nadolol 40 mg daily.  Also on Xifaxan 550 mg twice daily and low-dose diuretics.  He is due for surveillance colonoscopy at this time for h/o tubular adenomas.   Labs from 01/2022: MELD Na of 15, up from 7 in 08/2021. Creatinine and total bilirubin up from baseline in setting of some dehydration. Repeat creatinine and total bili returned to baseline upon recheck.  Today: He continues to have a lot of issues with abdominal pain.  Chronically we have seen him for quite some time for left upper quadrant and right lower quadrant pain.  He completed imaging in February via CT abdomen pelvis with contrast.  He had stable small umbilical hernia containing fat only, no evidence of appendicitis or recurrent inguinal hernia.  Stable findings of hepatic cirrhosis  and portal venous hypertension.  Past 5 years he has had gallbladder removed as well as right inguinal hernia repair.  BM 4-5 per day. Soft stool. No melena, brbpr. Has to urinate more frequently.  Urine culture unremarkable last month.  After eats, then hurts in umbilical area, then chill moves up to the epigastric region, and then burps/sneezes.  This symptom is new.  Denies any change in medications.  Complains of fatigue.  Denies dysuria.  No fever.  Has abdominal pain every day, especially in the mornings.  Later in the afternoons, abdominal pain is not as noticeable.  Not as noticeable when he is active and moving around.  Pain mostly in the periumbilical region at this time.  Denies heartburn.  Has some dysphagia at times, after food passes he has discomfort in that area.  No melena or rectal bleeding.   Medications   Current Outpatient Medications  Medication Sig Dispense Refill   Accu-Chek Softclix Lancets lancets Test BS 4 (four) times daily. Dx E11.69 (Patient taking differently: 1 each by Other route 4 (four) times daily as needed. Test BS 4 (four) times daily. Dx E11.69) 400 each 3   atorvastatin (LIPITOR) 10 MG tablet Take 1 tablet (10 mg total) by mouth daily. 90 tablet 1   Blood Glucose Monitoring Suppl (ACCU-CHEK GUIDE) w/Device KIT Test BS 4 (four) times daily. Dx E11.69 1 kit 0   cetirizine (ZYRTEC) 10 MG tablet Take 1 tablet (10 mg total) by mouth daily. 90 tablet 1   Continuous Blood Gluc Sensor (FREESTYLE LIBRE 2  SENSOR) MISC Use to test blood sugars 6x daily as directed 2 each 3   dapagliflozin propanediol (FARXIGA) 10 MG TABS tablet Take 1 tablet (10 mg total) by mouth daily before breakfast. 90 tablet 1   dexlansoprazole (DEXILANT) 60 MG capsule Take 1 capsule (60 mg total) by mouth daily. 90 capsule 1   diclofenac Sodium (VOLTAREN) 1 % GEL Apply 2 g topically 4 (four) times daily. 100 g 0   escitalopram (LEXAPRO) 10 MG tablet Take 1 tablet (10 mg total) by mouth daily. 90  tablet 1   fluticasone (FLONASE) 50 MCG/ACT nasal spray Use 2 spray(s) in each nostril once daily 16 g 5   furosemide (LASIX) 20 MG tablet Take 1 tablet by mouth once daily 90 tablet 3   gabapentin (NEURONTIN) 800 MG tablet Take 1 tablet (800 mg total) by mouth 3 (three) times daily. 270 tablet 1   glucose blood (ACCU-CHEK GUIDE) test strip Test BS 4 (four) times daily. Dx E11.69 400 each 3   insulin degludec (TRESIBA FLEXTOUCH) 100 UNIT/ML FlexTouch Pen INJECT 25 UNITS SUBCUTANEOUSLY AT BEDTIME 15 mL 1   Insulin Pen Needle (B-D UF III MINI PEN NEEDLES) 31G X 5 MM MISC USE WITH TRESIBA DAILY 100 each 3   metFORMIN (GLUCOPHAGE-XR) 750 MG 24 hr tablet Take 2 tablets by mouth once daily with breakfast 180 tablet 1   nadolol (CORGARD) 40 MG tablet Take 1 tablet by mouth once daily 90 tablet 3   rifaximin (XIFAXAN) 550 MG TABS tablet Take 1 tablet (550 mg total) by mouth 2 (two) times daily. 180 tablet 3   spironolactone (ALDACTONE) 50 MG tablet Take 1 tablet (50 mg total) by mouth daily. 90 tablet 1   VITAMIN D PO Take 1 capsule by mouth daily.      No current facility-administered medications for this visit.    Allergies   Allergies as of 04/15/2022   (No Known Allergies)     Past Medical History   Past Medical History:  Diagnosis Date   Cirrhosis ([place])    likely ETOH use    Diabetes ([place])    Heat stroke 2016/17    Past Surgical History   Past Surgical History:  Procedure Laterality Date   CHOLECYSTECTOMY N/A 09/01/2017   Procedure: LAPAROSCOPIC CHOLECYSTECTOMY;  Surgeon: Aviva Signs, MD;  Location: [facility];  Service: General;  Laterality: N/A;   COLONOSCOPY WITH PROPOFOL N/A 03/06/2017   Dr. Gala Romney: tubular adenoma removed, next TCS 5 years if health permits.    ESOPHAGOGASTRODUODENOSCOPY (EGD) WITH PROPOFOL N/A 03/06/2017   Dr. Gala Romney: grade 1 esophageal varices, erosive esophagitis, portal gastropathy.    ESOPHAGOGASTRODUODENOSCOPY (EGD) WITH PROPOFOL N/A 05/03/2019    Procedure: ESOPHAGOGASTRODUODENOSCOPY (EGD) WITH PROPOFOL;  Surgeon: Daneil Dolin, MD;  Location: [facility];  Service: Endoscopy;  Laterality: N/A;  10:30am   ESOPHAGOGASTRODUODENOSCOPY (EGD) WITH PROPOFOL N/A 12/23/2019   Procedure: ESOPHAGOGASTRODUODENOSCOPY (EGD) WITH PROPOFOL Possible esophageal variceal banding.;  Surgeon: Daneil Dolin, MD;  Location: [facility];  Service: Endoscopy;  Laterality: N/A;   HERNIA REPAIR Right    RIH   INGUINAL HERNIA REPAIR Right 07/11/2017   Procedure: HERNIA REPAIR INGUINAL ADULT WITH MESH;  Surgeon: Aviva Signs, MD;  Location: [facility];  Service: General;  Laterality: Right;  patient knows to arrive at 7:30   IR TRANSCATHETER BX  10/14/2017   IR US GUIDE VASC ACCESS RIGHT  10/14/2017   IR VENOGRAM HEPATIC WO HEMODYNAMIC EVALUATION  10/14/2017   MALONEY DILATION  N/A 05/03/2019   Procedure: Venia Minks DILATION;  Surgeon: Daneil Dolin, MD;  Location: [facility];  Service: Endoscopy;  Laterality: N/A;   POLYPECTOMY  03/06/2017   Procedure: POLYPECTOMY;  Surgeon: Daneil Dolin, MD;  Location: [facility];  Service: Endoscopy;;  ascending colon;    Past Family History   Family History  Problem Relation Age of Onset   COPD Mother    Liver disease Neg Hx    Colon cancer Neg Hx    Colon polyps Neg Hx     Past Social History   Social History   Socioeconomic History   Marital status: Divorced    Spouse name: Not on file   Number of children: Not on file   Years of education: Not on file   Highest education level: Not on file  Occupational History   Occupation: unemployed    Comment: disability  Tobacco Use   Smoking status: Never   Smokeless tobacco: Never  Vaping Use   Vaping Use: Never used  Substance and Sexual Activity   Alcohol use: No    Comment: Quit September 2017, heavy alcohol abuse previously   Drug use: No   Sexual activity: Not Currently    Partners: Female  Other Topics Concern   Not on file  Social History  Narrative   Lives in [place], [place] with his youngest daughter; 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    Drank alcohol for years, quit 05/07/16. Has been sober since then. Quit b/c of health/cirrhosis.   Divorced for over 15 years.   Dating sporadically.   Has 3 daughters and 1 son live locally.   Originally from [place], moved here b/c of wife.    Eats all foods.    Wear seatbelt.   Does not drive, does not have a license, lost due to DWI.    Brought by neighbor.    Does not go to church. Believes in God. Prays each night, per report.    Social Determinants of Health   Financial Resource Strain: Low Risk  (11/14/2021)   Overall Financial Resource Strain (CARDIA)    Difficulty of Paying Living Expenses: Not hard at all  Food Insecurity: No Food Insecurity (11/14/2021)   Hunger Vital Sign    Worried About Running Out of Food in the Last Year: Never true    Ran Out of Food in the Last Year: Never true  Transportation Needs: No Transportation Needs (11/14/2021)   PRAPARE - Hydrologist (Medical): No    Lack of Transportation (Non-Medical): No  Physical Activity: Sufficiently Active (11/14/2021)   Exercise Vital Sign    Days of Exercise per Week: 5 days    Minutes of Exercise per Session: 30 min  Stress: No Stress Concern Present (11/14/2021)   [place]    Feeling of Stress : Only a little  Social Connections: Socially Isolated (11/14/2021)   Social Connection and Isolation Panel [NHANES]    Frequency of Communication with Friends and Family: More than three times a week    Frequency of Social Gatherings with Friends and Family: More than three times a week    Attends Religious Services: Never    Marine scientist or Organizations: No    Attends Archivist Meetings: Never    Marital Status: Divorced  Human resources officer Violence: Not At Risk (11/14/2021)   Humiliation,  Afraid, Rape, and Kick  questionnaire    Fear of Current or Ex-Partner: No    Emotionally Abused: No    Physically Abused: No    Sexually Abused: No    Review of Systems   General: Negative for anorexia, weight loss, fever, chills, positive fatigue, weakness.  Over the past year he has lost about 15 pounds, weight stable for the past 2 months. ENT: Negative for hoarseness, difficulty swallowing , nasal congestion. CV: Negative for chest pain, angina, palpitations, dyspnea on exertion, peripheral edema.  Respiratory: Negative for dyspnea at rest, dyspnea on exertion, cough, sputum, wheezing.  GI: See history of present illness. GU:  Negative for dysuria, hematuria, urinary incontinence, urinary frequency, nocturnal urination.  Endo: Negative for unusual weight change.     Physical Exam   BP 106/67 (BP Location: Right Arm, Patient Position: Sitting, Cuff Size: Normal)   Pulse (!) 56   Temp (!) 97.1 F (36.2 C) (Temporal)   Ht '5\' 8"'  (1.727 m)   Wt 170 lb 6.4 oz (77.3 kg)   SpO2 98%   BMI 25.91 kg/m    General: Well-nourished, well-developed in no acute distress.  Eyes: No icterus. Mouth: Oropharyngeal mucosa moist and pink, no lesions erythema or exudate. Lungs: Clear to auscultation bilaterally.  Heart: Regular rate and rhythm, no murmurs rubs or gallops.  Abdomen: Bowel sounds are normal, nondistended, no hepatosplenomegaly or masses,  no abdominal bruits, no rebound or guarding.  Mild tenderness below the umbilicus, no rebound or guarding.  Small umbilical hernia, easily reducible. Rectal: Not performed Extremities: No lower extremity edema. No clubbing or deformities. Neuro: Alert and oriented x 4   Skin: Warm and dry, no jaundice.   Psych: Alert and cooperative, normal mood and affect.  Labs   Lab Results  Component Value Date   CREATININE 0.85 02/26/2022   BUN 10 02/26/2022   NA 136 02/26/2022   K 4.7 02/26/2022   CL 101 02/26/2022   CO2 28 02/26/2022   Lab  Results  Component Value Date   ALT 41 02/26/2022   AST 36 02/26/2022   ALKPHOS 38 02/26/2022   BILITOT 0.8 02/26/2022   Lab Results  Component Value Date   WBC 7.5 02/18/2022   HGB 17.8 (H) 02/18/2022   HCT 50.4 02/18/2022   MCV 96.2 02/18/2022   PLT 174 02/18/2022   Lab Results  Component Value Date   INR 1.1 02/18/2022   INR 1.0 08/23/2021   INR 1.0 02/19/2021    Imaging Studies   No results found.  Assessment   Abdominal pain: chronic abdominal pain over the years.  In the past 5 years he has had his gallbladder removed as well as right inguinal hernia repair.  Due to right lower quadrant pain earlier this year, he completed CT in February as outlined above.  Last visit he had mostly left lower quadrant pain.  Today complains of pain below umbilicus and radiates upward into the epigastric region.  Has urinary frequency in the setting of diuretics.  Urine culture was negative last month.  States he feels like he empties his bladder adequately.  Discussed at length with patient today, his abdominal pain has evolved again, etiology unclear.  Abdominal exam overall benign.  At this time would hold off on repeat CT.  Proceed with colonoscopy and upper endoscopy.  History of tubular adenomatous colon due for colonoscopy at this time.  GERD: Typical symptoms well controlled.  Complains of some solid food dysphagia.  Frequent stools: Possibly medication related.  Denies diarrhea.  Colonoscopy is planned.  Cirrhosis: Has been well compensated.  MELD sodium of 15 in July, in the setting of bump in creatinine and total bilirubin.  Fortunately repeat labs showed normalized.    PLAN   Due for hepatoma screening via ultrasound. Colonoscopy and upper endoscopy in the near future.  Possible esophageal variceal banding if needed.  If no esophageal varices, may consider esophageal dilation for history of dysphagia.  ASA 3.  Patient prefers to have procedures as soon as possible, ok with  Dr. Abbey Chatters completing if needed due to scheduling restraints.  I have discussed the risks, alternatives, benefits with regards to but not limited to the risk of reaction to medication, bleeding, infection, perforation and the patient is agreeable to proceed. Written consent to be obtained.    Laureen Ochs. Bobby Rumpf, [place], [facility]

## 2022-04-14 NOTE — H&P (View-Only) (Signed)
GI Office Note    Referring Provider: Sharion Balloon, FNP Primary Care Physician:  Sharion Balloon, FNP  Primary Gastroenterologist:  Chief Complaint   Chief Complaint  Patient presents with   Follow-up    Still having abdominal pain    History of Present Illness   Thomas Pratt is a 61 y.o. male presenting today for follow up abdominal pain. He was last seen in 01/2022. He has history cirrhosis presumably due to prior alcohol abuse (sober since 2017) however, he had positive AMA, AMA, ASMA (on repeat negative), elevated IgG, liver biopsy with granulomatous hepatitis. Evaluated by [facility].  Labs repeated and they reviewed liver biopsy slides and suspected cirrhosis secondary to alcohol use but did have hepatic sarcoidosis.  Pulmonary work-up negative for lung involvement.  [name] was stopped by [facility] liver.  Advised to complete 6 months of substance abuse counseling and return if liver disease decompensates.  History of upper GI bleed in June 2021, EGD showed innocent appearing and barely grade 2 esophageal varices, mild portal hypertensive gastropathy, no evidence of bleeding from upper GI tract.  He has been on nadolol 40 mg daily.  Also on Xifaxan 550 mg twice daily and low-dose diuretics.  He is due for surveillance colonoscopy at this time for h/o tubular adenomas.   Labs from 01/2022: MELD Na of 15, up from 7 in 08/2021. Creatinine and total bilirubin up from baseline in setting of some dehydration. Repeat creatinine and total bili returned to baseline upon recheck.  Today: He continues to have a lot of issues with abdominal pain.  Chronically we have seen him for quite some time for left upper quadrant and right lower quadrant pain.  He completed imaging in February via CT abdomen pelvis with contrast.  He had stable small umbilical hernia containing fat only, no evidence of appendicitis or recurrent inguinal hernia.  Stable findings of hepatic cirrhosis  and portal venous hypertension.  Past 5 years he has had gallbladder removed as well as right inguinal hernia repair.  BM 4-5 per day. Soft stool. No melena, brbpr. Has to urinate more frequently.  Urine culture unremarkable last month.  After eats, then hurts in umbilical area, then chill moves up to the epigastric region, and then burps/sneezes.  This symptom is new.  Denies any change in medications.  Complains of fatigue.  Denies dysuria.  No fever.  Has abdominal pain every day, especially in the mornings.  Later in the afternoons, abdominal pain is not as noticeable.  Not as noticeable when he is active and moving around.  Pain mostly in the periumbilical region at this time.  Denies heartburn.  Has some dysphagia at times, after food passes he has discomfort in that area.  No melena or rectal bleeding.   Medications   Current Outpatient Medications  Medication Sig Dispense Refill   Accu-Chek Softclix Lancets lancets Test BS 4 (four) times daily. Dx E11.69 (Patient taking differently: 1 each by Other route 4 (four) times daily as needed. Test BS 4 (four) times daily. Dx E11.69) 400 each 3   atorvastatin (LIPITOR) 10 MG tablet Take 1 tablet (10 mg total) by mouth daily. 90 tablet 1   Blood Glucose Monitoring Suppl (ACCU-CHEK GUIDE) w/Device KIT Test BS 4 (four) times daily. Dx E11.69 1 kit 0   cetirizine (ZYRTEC) 10 MG tablet Take 1 tablet (10 mg total) by mouth daily. 90 tablet 1   Continuous Blood Gluc Sensor (FREESTYLE LIBRE 2  SENSOR) MISC Use to test blood sugars 6x daily as directed 2 each 3   dapagliflozin propanediol (FARXIGA) 10 MG TABS tablet Take 1 tablet (10 mg total) by mouth daily before breakfast. 90 tablet 1   dexlansoprazole (DEXILANT) 60 MG capsule Take 1 capsule (60 mg total) by mouth daily. 90 capsule 1   diclofenac Sodium (VOLTAREN) 1 % GEL Apply 2 g topically 4 (four) times daily. 100 g 0   escitalopram (LEXAPRO) 10 MG tablet Take 1 tablet (10 mg total) by mouth daily. 90  tablet 1   fluticasone (FLONASE) 50 MCG/ACT nasal spray Use 2 spray(s) in each nostril once daily 16 g 5   furosemide (LASIX) 20 MG tablet Take 1 tablet by mouth once daily 90 tablet 3   gabapentin (NEURONTIN) 800 MG tablet Take 1 tablet (800 mg total) by mouth 3 (three) times daily. 270 tablet 1   glucose blood (ACCU-CHEK GUIDE) test strip Test BS 4 (four) times daily. Dx E11.69 400 each 3   insulin degludec (TRESIBA FLEXTOUCH) 100 UNIT/ML FlexTouch Pen INJECT 25 UNITS SUBCUTANEOUSLY AT BEDTIME 15 mL 1   Insulin Pen Needle (B-D UF III MINI PEN NEEDLES) 31G X 5 MM MISC USE WITH TRESIBA DAILY 100 each 3   metFORMIN (GLUCOPHAGE-XR) 750 MG 24 hr tablet Take 2 tablets by mouth once daily with breakfast 180 tablet 1   nadolol (CORGARD) 40 MG tablet Take 1 tablet by mouth once daily 90 tablet 3   rifaximin (XIFAXAN) 550 MG TABS tablet Take 1 tablet (550 mg total) by mouth 2 (two) times daily. 180 tablet 3   spironolactone (ALDACTONE) 50 MG tablet Take 1 tablet (50 mg total) by mouth daily. 90 tablet 1   VITAMIN D PO Take 1 capsule by mouth daily.      No current facility-administered medications for this visit.    Allergies   Allergies as of 04/15/2022   (No Known Allergies)     Past Medical History   Past Medical History:  Diagnosis Date   Cirrhosis ([place])    likely ETOH use    Diabetes ([place])    Heat stroke 2016/17    Past Surgical History   Past Surgical History:  Procedure Laterality Date   CHOLECYSTECTOMY N/A 09/01/2017   Procedure: LAPAROSCOPIC CHOLECYSTECTOMY;  Surgeon: Aviva Signs, MD;  Location: [facility];  Service: General;  Laterality: N/A;   COLONOSCOPY WITH PROPOFOL N/A 03/06/2017   Dr. Gala Romney: tubular adenoma removed, next TCS 5 years if health permits.    ESOPHAGOGASTRODUODENOSCOPY (EGD) WITH PROPOFOL N/A 03/06/2017   Dr. Gala Romney: grade 1 esophageal varices, erosive esophagitis, portal gastropathy.    ESOPHAGOGASTRODUODENOSCOPY (EGD) WITH PROPOFOL N/A 05/03/2019    Procedure: ESOPHAGOGASTRODUODENOSCOPY (EGD) WITH PROPOFOL;  Surgeon: Daneil Dolin, MD;  Location: [facility];  Service: Endoscopy;  Laterality: N/A;  10:30am   ESOPHAGOGASTRODUODENOSCOPY (EGD) WITH PROPOFOL N/A 12/23/2019   Procedure: ESOPHAGOGASTRODUODENOSCOPY (EGD) WITH PROPOFOL Possible esophageal variceal banding.;  Surgeon: Daneil Dolin, MD;  Location: [facility];  Service: Endoscopy;  Laterality: N/A;   HERNIA REPAIR Right    RIH   INGUINAL HERNIA REPAIR Right 07/11/2017   Procedure: HERNIA REPAIR INGUINAL ADULT WITH MESH;  Surgeon: Aviva Signs, MD;  Location: [facility];  Service: General;  Laterality: Right;  patient knows to arrive at 7:30   IR TRANSCATHETER BX  10/14/2017   IR US GUIDE VASC ACCESS RIGHT  10/14/2017   IR VENOGRAM HEPATIC WO HEMODYNAMIC EVALUATION  10/14/2017   MALONEY DILATION  N/A 05/03/2019   Procedure: Venia Minks DILATION;  Surgeon: Daneil Dolin, MD;  Location: [facility];  Service: Endoscopy;  Laterality: N/A;   POLYPECTOMY  03/06/2017   Procedure: POLYPECTOMY;  Surgeon: Daneil Dolin, MD;  Location: [facility];  Service: Endoscopy;;  ascending colon;    Past Family History   Family History  Problem Relation Age of Onset   COPD Mother    Liver disease Neg Hx    Colon cancer Neg Hx    Colon polyps Neg Hx     Past Social History   Social History   Socioeconomic History   Marital status: Divorced    Spouse name: Not on file   Number of children: Not on file   Years of education: Not on file   Highest education level: Not on file  Occupational History   Occupation: unemployed    Comment: disability  Tobacco Use   Smoking status: Never   Smokeless tobacco: Never  Vaping Use   Vaping Use: Never used  Substance and Sexual Activity   Alcohol use: No    Comment: Quit September 2017, heavy alcohol abuse previously   Drug use: No   Sexual activity: Not Currently    Partners: Female  Other Topics Concern   Not on file  Social History  Narrative   Lives in [place], [place] with his youngest daughter; 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    Drank alcohol for years, quit 05/07/16. Has been sober since then. Quit b/c of health/cirrhosis.   Divorced for over 15 years.   Dating sporadically.   Has 3 daughters and 1 son live locally.   Originally from [place], moved here b/c of wife.    Eats all foods.    Wear seatbelt.   Does not drive, does not have a license, lost due to DWI.    Brought by neighbor.    Does not go to church. Believes in God. Prays each night, per report.    Social Determinants of Health   Financial Resource Strain: Low Risk  (11/14/2021)   Overall Financial Resource Strain (CARDIA)    Difficulty of Paying Living Expenses: Not hard at all  Food Insecurity: No Food Insecurity (11/14/2021)   Hunger Vital Sign    Worried About Running Out of Food in the Last Year: Never true    Ran Out of Food in the Last Year: Never true  Transportation Needs: No Transportation Needs (11/14/2021)   PRAPARE - Hydrologist (Medical): No    Lack of Transportation (Non-Medical): No  Physical Activity: Sufficiently Active (11/14/2021)   Exercise Vital Sign    Days of Exercise per Week: 5 days    Minutes of Exercise per Session: 30 min  Stress: No Stress Concern Present (11/14/2021)   [place]    Feeling of Stress : Only a little  Social Connections: Socially Isolated (11/14/2021)   Social Connection and Isolation Panel [NHANES]    Frequency of Communication with Friends and Family: More than three times a week    Frequency of Social Gatherings with Friends and Family: More than three times a week    Attends Religious Services: Never    Marine scientist or Organizations: No    Attends Archivist Meetings: Never    Marital Status: Divorced  Human resources officer Violence: Not At Risk (11/14/2021)   Humiliation,  Afraid, Rape, and Kick  questionnaire    Fear of Current or Ex-Partner: No    Emotionally Abused: No    Physically Abused: No    Sexually Abused: No    Review of Systems   General: Negative for anorexia, weight loss, fever, chills, positive fatigue, weakness.  Over the past year he has lost about 15 pounds, weight stable for the past 2 months. ENT: Negative for hoarseness, difficulty swallowing , nasal congestion. CV: Negative for chest pain, angina, palpitations, dyspnea on exertion, peripheral edema.  Respiratory: Negative for dyspnea at rest, dyspnea on exertion, cough, sputum, wheezing.  GI: See history of present illness. GU:  Negative for dysuria, hematuria, urinary incontinence, urinary frequency, nocturnal urination.  Endo: Negative for unusual weight change.     Physical Exam   BP 106/67 (BP Location: Right Arm, Patient Position: Sitting, Cuff Size: Normal)   Pulse (!) 56   Temp (!) 97.1 F (36.2 C) (Temporal)   Ht '5\' 8"'  (1.727 m)   Wt 170 lb 6.4 oz (77.3 kg)   SpO2 98%   BMI 25.91 kg/m    General: Well-nourished, well-developed in no acute distress.  Eyes: No icterus. Mouth: Oropharyngeal mucosa moist and pink, no lesions erythema or exudate. Lungs: Clear to auscultation bilaterally.  Heart: Regular rate and rhythm, no murmurs rubs or gallops.  Abdomen: Bowel sounds are normal, nondistended, no hepatosplenomegaly or masses,  no abdominal bruits, no rebound or guarding.  Mild tenderness below the umbilicus, no rebound or guarding.  Small umbilical hernia, easily reducible. Rectal: Not performed Extremities: No lower extremity edema. No clubbing or deformities. Neuro: Alert and oriented x 4   Skin: Warm and dry, no jaundice.   Psych: Alert and cooperative, normal mood and affect.  Labs   Lab Results  Component Value Date   CREATININE 0.85 02/26/2022   BUN 10 02/26/2022   NA 136 02/26/2022   K 4.7 02/26/2022   CL 101 02/26/2022   CO2 28 02/26/2022   Lab  Results  Component Value Date   ALT 41 02/26/2022   AST 36 02/26/2022   ALKPHOS 38 02/26/2022   BILITOT 0.8 02/26/2022   Lab Results  Component Value Date   WBC 7.5 02/18/2022   HGB 17.8 (H) 02/18/2022   HCT 50.4 02/18/2022   MCV 96.2 02/18/2022   PLT 174 02/18/2022   Lab Results  Component Value Date   INR 1.1 02/18/2022   INR 1.0 08/23/2021   INR 1.0 02/19/2021    Imaging Studies   No results found.  Assessment   Abdominal pain: chronic abdominal pain over the years.  In the past 5 years he has had his gallbladder removed as well as right inguinal hernia repair.  Due to right lower quadrant pain earlier this year, he completed CT in February as outlined above.  Last visit he had mostly left lower quadrant pain.  Today complains of pain below umbilicus and radiates upward into the epigastric region.  Has urinary frequency in the setting of diuretics.  Urine culture was negative last month.  States he feels like he empties his bladder adequately.  Discussed at length with patient today, his abdominal pain has evolved again, etiology unclear.  Abdominal exam overall benign.  At this time would hold off on repeat CT.  Proceed with colonoscopy and upper endoscopy.  History of tubular adenomatous colon due for colonoscopy at this time.  GERD: Typical symptoms well controlled.  Complains of some solid food dysphagia.  Frequent stools: Possibly medication related.  Denies diarrhea.  Colonoscopy is planned.  Cirrhosis: Has been well compensated.  MELD sodium of 15 in July, in the setting of bump in creatinine and total bilirubin.  Fortunately repeat labs showed normalized.    PLAN   Due for hepatoma screening via ultrasound. Colonoscopy and upper endoscopy in the near future.  Possible esophageal variceal banding if needed.  If no esophageal varices, may consider esophageal dilation for history of dysphagia.  ASA 3.  Patient prefers to have procedures as soon as possible, ok with  Dr. Abbey Chatters completing if needed due to scheduling restraints.  I have discussed the risks, alternatives, benefits with regards to but not limited to the risk of reaction to medication, bleeding, infection, perforation and the patient is agreeable to proceed. Written consent to be obtained.    Laureen Ochs. Bobby Rumpf, [place], [facility]

## 2022-04-15 ENCOUNTER — Telehealth: Payer: Self-pay | Admitting: Gastroenterology

## 2022-04-15 ENCOUNTER — Telehealth: Payer: Self-pay | Admitting: *Deleted

## 2022-04-15 ENCOUNTER — Encounter: Payer: Self-pay | Admitting: *Deleted

## 2022-04-15 ENCOUNTER — Encounter: Payer: Self-pay | Admitting: Gastroenterology

## 2022-04-15 ENCOUNTER — Ambulatory Visit (INDEPENDENT_AMBULATORY_CARE_PROVIDER_SITE_OTHER): Payer: Medicare Other | Admitting: Gastroenterology

## 2022-04-15 VITALS — BP 106/67 | HR 56 | Temp 97.1°F | Ht 68.0 in | Wt 170.4 lb

## 2022-04-15 DIAGNOSIS — R1310 Dysphagia, unspecified: Secondary | ICD-10-CM

## 2022-04-15 DIAGNOSIS — R109 Unspecified abdominal pain: Secondary | ICD-10-CM | POA: Insufficient documentation

## 2022-04-15 DIAGNOSIS — K7460 Unspecified cirrhosis of liver: Secondary | ICD-10-CM

## 2022-04-15 DIAGNOSIS — K529 Noninfective gastroenteritis and colitis, unspecified: Secondary | ICD-10-CM

## 2022-04-15 DIAGNOSIS — Z86010 Personal history of colonic polyps: Secondary | ICD-10-CM

## 2022-04-15 DIAGNOSIS — R1030 Lower abdominal pain, unspecified: Secondary | ICD-10-CM | POA: Diagnosis not present

## 2022-04-15 NOTE — Addendum Note (Signed)
Addended by: Cheron Every on: 04/15/2022 02:12 PM   Modules accepted: Orders

## 2022-04-15 NOTE — Telephone Encounter (Signed)
PA approved via [facility] website. Auth# [id number], DOS: Apr 23, 2022 - Jul 21, 2022   Also called pt, LMOVM with pre-op appt details

## 2022-04-15 NOTE — Telephone Encounter (Signed)
[place] pt, left detailed VM on named VM for [place] appt details

## 2022-04-15 NOTE — Patient Instructions (Signed)
Colonoscopy and upper endoscopy to be scheduled with Dr. Abbey Chatters. If you have any varices (blood vessels in the esophagus) he will likely band them at same time. If no significant varices, he may stretch your esophagus to help with swallowing.

## 2022-04-15 NOTE — Telephone Encounter (Signed)
Pt called and aware of appt details for [place] and pre-op appt.

## 2022-04-15 NOTE — Telephone Encounter (Signed)
Patient is due for hepatoma screening. Please arrange for RUQ U/S. Seen in the office today.

## 2022-04-16 NOTE — Patient Instructions (Signed)
Your procedure is scheduled on: 04/23/2022  Report to [facility] at 8:45    AM.  Call this number if you have problems the morning of surgery: [phone]   Remember:              Follow Directions on the letter you received from Your Physician's office regarding the Bowel Prep              No Smoking the day of Procedure :   Take these medicines the morning of surgery with A SIP OF WATER: Dexilant, Zyrtec, Lexapro, Gabapentin, and Nadolol   Do not wear jewelry, make-up or nail polish.    Do not bring valuables to the hospital.  Contacts, dentures or bridgework may not be worn into surgery.  .   Patients discharged the day of surgery will not be allowed to drive home.     Colonoscopy, Adult, Care After This sheet gives you information about how to care for yourself after your procedure. Your health care provider may also give you more specific instructions. If you have problems or questions, contact your health care provider. What can I expect after the procedure? After the procedure, it is common to have: A small amount of blood in your stool for 24 hours after the procedure. Some gas. Mild abdominal cramping or bloating.  Follow these instructions at home: General instructions  For the first 24 hours after the procedure: Do not drive or use machinery. Do not sign important documents. Do not drink alcohol. Do your regular daily activities at a slower pace than normal. Eat soft, easy-to-digest foods. Rest often. Take over-the-counter or prescription medicines only as told by your health care provider. It is up to you to get the results of your procedure. Ask your health care provider, or the department performing the procedure, when your results will be ready. Relieving cramping and bloating Try walking around when you have cramps or feel bloated. Apply heat to your abdomen as told by your health care provider. Use a heat source that your health care  provider recommends, such as a moist heat pack or a heating pad. Place a towel between your skin and the heat source. Leave the heat on for 20-30 minutes. Remove the heat if your skin turns bright red. This is especially important if you are unable to feel pain, heat, or cold. You may have a greater risk of getting burned. Eating and drinking Drink enough fluid to keep your urine clear or pale yellow. Resume your normal diet as instructed by your health care provider. Avoid heavy or fried foods that are hard to digest. Avoid drinking alcohol for as long as instructed by your health care provider. Contact a health care provider if: You have blood in your stool 2-3 days after the procedure. Get help right away if: You have more than a small spotting of blood in your stool. You pass large blood clots in your stool. Your abdomen is swollen. You have nausea or vomiting. You have a fever. You have increasing abdominal pain that is not relieved with medicine. This information is not intended to replace advice given to you by your health care provider. Make sure you discuss any questions you have with your health care provider. Document Released: 02/20/2004 Document Revised: 04/01/2016 Document Reviewed: 09/19/2015 Elsevier Interactive Patient Education  2018 [facility], Adult, Care After After the procedure, it is common to have a sore throat. It is also common  to have: Mild stomach pain or discomfort. Bloating. Nausea. Follow these instructions at home: The instructions below may help you care for yourself at home. Your health care provider may give you more instructions. If you have questions, ask your health care provider. If you were given a sedative during the procedure, it can affect you for several hours. Do not drive or operate machinery until your health care provider says that it is safe. If you will be going home right after the procedure, plan to have a  responsible adult: Take you home from the hospital or clinic. You will not be allowed to drive. Care for you for the time you are told. Follow instructions from your health care provider about what you may eat and drink. Return to your normal activities as told by your health care provider. Ask your health care provider what activities are safe for you. Take over-the-counter and prescription medicines only as told by your health care provider. Contact a health care provider if you: Have a sore throat that lasts longer than one day. Have trouble swallowing. Have a fever. Get help right away if you: Vomit blood or your vomit looks like coffee grounds. Have bloody, black, or tarry stools. Have a very bad sore throat or you cannot swallow. Have difficulty breathing or very bad pain in your chest or abdomen. These symptoms may be an emergency. Get help right away. Call 911. Do not wait to see if the symptoms will go away. Do not drive yourself to the hospital. Summary After the procedure, it is common to have a sore throat, mild stomach discomfort, bloating, and nausea. If you were given a sedative during the procedure, it can affect you for several hours. Do not drive until your health care provider says that it is safe. Follow instructions from your health care provider about what you may eat and drink. Return to your normal activities as told by your health care provider. This information is not intended to replace advice given to you by your health care provider. Make sure you discuss any questions you have with your health care provider. Document Revised: 10/17/2021 Document Reviewed: 10/17/2021 Elsevier Patient Education  [place]. Esophageal Dilatation Esophageal dilatation, also called esophageal dilation, is a procedure to widen or open a blocked or narrowed part of the esophagus. The esophagus is the part of the body that moves food and liquid from the mouth to the stomach.  You may need this procedure if: You have a buildup of scar tissue in your esophagus that makes it difficult, painful, or impossible to swallow. This can be caused by gastroesophageal reflux disease (GERD). You have cancer of the esophagus. There is a problem with how food moves through your esophagus. In some cases, you may need this procedure repeated at a later time to dilate the esophagus gradually. Tell a health care provider about: Any allergies you have. All medicines you are taking, including vitamins, herbs, eye drops, creams, and over-the-counter medicines. Any problems you or family members have had with anesthetic medicines. Any blood disorders you have. Any surgeries you have had. Any medical conditions you have. Any antibiotic medicines you are required to take before dental procedures. Whether you are pregnant or may be pregnant. What are the risks? Generally, this is a safe procedure. However, problems may occur, including: Bleeding due to a tear in the lining of the esophagus. A hole, or perforation, in the esophagus. What happens before the procedure? Ask your health care  provider about: Changing or stopping your regular medicines. This is especially important if you are taking diabetes medicines or blood thinners. Taking medicines such as aspirin and ibuprofen. These medicines can thin your blood. Do not take these medicines unless your health care provider tells you to take them. Taking over-the-counter medicines, vitamins, herbs, and supplements. Follow instructions from your health care provider about eating or drinking restrictions. Plan to have a responsible adult take you home from the hospital or clinic. Plan to have a responsible adult care for you for the time you are told after you leave the hospital or clinic. This is important. What happens during the procedure? You may be given a medicine to help you relax (sedative). A numbing medicine may be sprayed into  the back of your throat, or you may gargle the medicine. Your health care provider may perform the dilatation using various surgical instruments, such as: Simple dilators. This instrument is carefully placed in the esophagus to stretch it. Guided wire bougies. This involves using an endoscope to insert a wire into the esophagus. A dilator is passed over this wire to enlarge the esophagus. Then the wire is removed. Balloon dilators. An endoscope with a small balloon is inserted into the esophagus. The balloon is inflated to stretch the esophagus and open it up. The procedure may vary among health care providers and hospitals. What can I expect after the procedure? Your blood pressure, heart rate, breathing rate, and blood oxygen level will be monitored until you leave the hospital or clinic. Your throat may feel slightly sore and numb. This will get better over time. You will not be allowed to eat or drink until your throat is no longer numb. When you are able to drink, urinate, and sit on the edge of the bed without nausea or dizziness, you may be able to return home. Follow these instructions at home: Take over-the-counter and prescription medicines only as told by your health care provider. If you were given a sedative during the procedure, it can affect you for several hours. Do not drive or operate machinery until your health care provider says that it is safe. Plan to have a responsible adult care for you for the time you are told. This is important. Follow instructions from your health care provider about any eating or drinking restrictions. Do not use any products that contain nicotine or tobacco, such as cigarettes, e-cigarettes, and chewing tobacco. If you need help quitting, ask your health care provider. Keep all follow-up visits. This is important. Contact a health care provider if: You have a fever. You have pain that is not relieved by medicine. Get help right away if: You have  chest pain. You have trouble breathing. You have trouble swallowing. You vomit blood. You have black, tarry, or bloody stools. These symptoms may represent a serious problem that is an emergency. Do not wait to see if the symptoms will go away. Get medical help right away. Call your local emergency services (911 in the [place] Do not drive yourself to the hospital. Summary Esophageal dilatation, also called esophageal dilation, is a procedure to widen or open a blocked or narrowed part of the esophagus. Plan to have a responsible adult take you home from the hospital or clinic. For this procedure, a numbing medicine may be sprayed into the back of your throat, or you may gargle the medicine. Do not drive or operate machinery until your health care provider says that it is safe. This information is  not intended to replace advice given to you by your health care provider. Make sure you discuss any questions you have with your health care provider. Document Revised: 11/24/2019 Document Reviewed: 11/24/2019 Elsevier Patient Education  [place].

## 2022-04-18 ENCOUNTER — Encounter (HOSPITAL_COMMUNITY)
Admission: RE | Admit: 2022-04-18 | Discharge: 2022-04-18 | Disposition: A | Discharge status: Home / Self Care | Payer: Medicare Other | Source: Ambulatory Visit | Attending: Internal Medicine | Admitting: Internal Medicine

## 2022-04-18 ENCOUNTER — Encounter (HOSPITAL_COMMUNITY): Payer: Self-pay

## 2022-04-18 VITALS — BP 107/80 | HR 58 | Temp 97.1°F | Resp 18 | Ht 68.0 in | Wt 170.4 lb

## 2022-04-18 DIAGNOSIS — E1169 Type 2 diabetes mellitus with other specified complication: Secondary | ICD-10-CM | POA: Diagnosis not present

## 2022-04-18 DIAGNOSIS — K7030 Alcoholic cirrhosis of liver without ascites: Secondary | ICD-10-CM | POA: Diagnosis not present

## 2022-04-18 DIAGNOSIS — Z794 Long term (current) use of insulin: Secondary | ICD-10-CM | POA: Diagnosis not present

## 2022-04-18 DIAGNOSIS — Z01818 Encounter for other preprocedural examination: Secondary | ICD-10-CM | POA: Diagnosis not present

## 2022-04-18 HISTORY — DX: Depression, unspecified: F32.A

## 2022-04-18 HISTORY — DX: Anxiety disorder, unspecified: F41.9

## 2022-04-18 LAB — CBC WITH DIFFERENTIAL/PLATELET
Abs Immature Granulocytes: 0.01 10*3/uL (ref 0.00–0.07)
Basophils Absolute: 0 10*3/uL (ref 0.0–0.1)
Basophils Relative: 1 %
Eosinophils Absolute: 0.2 10*3/uL (ref 0.0–0.5)
Eosinophils Relative: 2 %
HCT: 49.1 % (ref 39.0–52.0)
Hemoglobin: 17.3 g/dL — ABNORMAL HIGH (ref 13.0–17.0)
Immature Granulocytes: 0 %
Lymphocytes Relative: 42 %
Lymphs Abs: 2.8 10*3/uL (ref 0.7–4.0)
MCH: 33.9 pg (ref 26.0–34.0)
MCHC: 35.2 g/dL (ref 30.0–36.0)
MCV: 96.3 fL (ref 80.0–100.0)
Monocytes Absolute: 0.4 10*3/uL (ref 0.1–1.0)
Monocytes Relative: 7 %
Neutro Abs: 3.3 10*3/uL (ref 1.7–7.7)
Neutrophils Relative %: 48 %
Platelets: 180 10*3/uL (ref 150–400)
RBC: 5.1 MIL/uL (ref 4.22–5.81)
RDW: 12.5 % (ref 11.5–15.5)
WBC: 6.7 10*3/uL (ref 4.0–10.5)
nRBC: 0 % (ref 0.0–0.2)

## 2022-04-18 LAB — COMPREHENSIVE METABOLIC PANEL
ALT: 41 U/L (ref 0–44)
AST: 30 U/L (ref 15–41)
Albumin: 4.5 g/dL (ref 3.5–5.0)
Alkaline Phosphatase: 46 U/L (ref 38–126)
Anion gap: 10 (ref 5–15)
BUN: 14 mg/dL (ref 8–23)
CO2: 28 mmol/L (ref 22–32)
Calcium: 10 mg/dL (ref 8.9–10.3)
Chloride: 99 mmol/L (ref 98–111)
Creatinine, Ser: 1.15 mg/dL (ref 0.61–1.24)
GFR, Estimated: >60 mL/min (ref >60)
Glucose, Bld: 149 mg/dL — ABNORMAL HIGH (ref 70–99)
Potassium: 4.7 mmol/L (ref 3.5–5.1)
Sodium: 137 mmol/L (ref 135–145)
Total Bilirubin: 1.2 mg/dL (ref 0.3–1.2)
Total Protein: 7.9 g/dL (ref 6.5–8.1)

## 2022-04-18 LAB — PROTIME-INR
INR: 1 (ref 0.8–1.2)
Prothrombin Time: 13.2 seconds (ref 11.4–15.2)

## 2022-04-23 ENCOUNTER — Ambulatory Visit (HOSPITAL_BASED_OUTPATIENT_CLINIC_OR_DEPARTMENT_OTHER): Payer: Medicare Other | Admitting: Anesthesiology

## 2022-04-23 ENCOUNTER — Ambulatory Visit (HOSPITAL_COMMUNITY): Payer: Medicare Other | Admitting: Anesthesiology

## 2022-04-23 ENCOUNTER — Encounter (HOSPITAL_COMMUNITY)
Admission: RE | Disposition: A | Discharge status: Home / Self Care | Payer: Self-pay | Source: Home / Self Care | Attending: Internal Medicine

## 2022-04-23 ENCOUNTER — Other Ambulatory Visit: Payer: Self-pay

## 2022-04-23 ENCOUNTER — Ambulatory Visit (HOSPITAL_COMMUNITY)
Admission: RE | Admit: 2022-04-23 | Discharge: 2022-04-23 | Disposition: A | Discharge status: Home / Self Care | Payer: Medicare Other | Attending: Internal Medicine | Admitting: Internal Medicine

## 2022-04-23 ENCOUNTER — Encounter (HOSPITAL_COMMUNITY): Payer: Self-pay

## 2022-04-23 DIAGNOSIS — D123 Benign neoplasm of transverse colon: Secondary | ICD-10-CM | POA: Insufficient documentation

## 2022-04-23 DIAGNOSIS — F419 Anxiety disorder, unspecified: Secondary | ICD-10-CM | POA: Diagnosis not present

## 2022-04-23 DIAGNOSIS — K648 Other hemorrhoids: Secondary | ICD-10-CM | POA: Diagnosis not present

## 2022-04-23 DIAGNOSIS — K449 Diaphragmatic hernia without obstruction or gangrene: Secondary | ICD-10-CM | POA: Diagnosis not present

## 2022-04-23 DIAGNOSIS — K7460 Unspecified cirrhosis of liver: Secondary | ICD-10-CM | POA: Diagnosis not present

## 2022-04-23 DIAGNOSIS — Z9049 Acquired absence of other specified parts of digestive tract: Secondary | ICD-10-CM | POA: Insufficient documentation

## 2022-04-23 DIAGNOSIS — Z79899 Other long term (current) drug therapy: Secondary | ICD-10-CM | POA: Diagnosis not present

## 2022-04-23 DIAGNOSIS — F32A Depression, unspecified: Secondary | ICD-10-CM | POA: Diagnosis not present

## 2022-04-23 DIAGNOSIS — I10 Essential (primary) hypertension: Secondary | ICD-10-CM | POA: Insufficient documentation

## 2022-04-23 DIAGNOSIS — K753 Granulomatous hepatitis, not elsewhere classified: Secondary | ICD-10-CM | POA: Diagnosis not present

## 2022-04-23 DIAGNOSIS — Z794 Long term (current) use of insulin: Secondary | ICD-10-CM | POA: Diagnosis not present

## 2022-04-23 DIAGNOSIS — R1032 Left lower quadrant pain: Secondary | ICD-10-CM

## 2022-04-23 DIAGNOSIS — Z7984 Long term (current) use of oral hypoglycemic drugs: Secondary | ICD-10-CM | POA: Insufficient documentation

## 2022-04-23 DIAGNOSIS — Z09 Encounter for follow-up examination after completed treatment for conditions other than malignant neoplasm: Secondary | ICD-10-CM | POA: Diagnosis not present

## 2022-04-23 DIAGNOSIS — I8510 Secondary esophageal varices without bleeding: Secondary | ICD-10-CM | POA: Insufficient documentation

## 2022-04-23 DIAGNOSIS — E119 Type 2 diabetes mellitus without complications: Secondary | ICD-10-CM | POA: Insufficient documentation

## 2022-04-23 DIAGNOSIS — K2970 Gastritis, unspecified, without bleeding: Secondary | ICD-10-CM | POA: Diagnosis not present

## 2022-04-23 DIAGNOSIS — Z1211 Encounter for screening for malignant neoplasm of colon: Secondary | ICD-10-CM | POA: Insufficient documentation

## 2022-04-23 DIAGNOSIS — K219 Gastro-esophageal reflux disease without esophagitis: Secondary | ICD-10-CM | POA: Insufficient documentation

## 2022-04-23 DIAGNOSIS — K429 Umbilical hernia without obstruction or gangrene: Secondary | ICD-10-CM | POA: Diagnosis not present

## 2022-04-23 DIAGNOSIS — R1012 Left upper quadrant pain: Secondary | ICD-10-CM

## 2022-04-23 DIAGNOSIS — R1310 Dysphagia, unspecified: Secondary | ICD-10-CM

## 2022-04-23 DIAGNOSIS — Z8673 Personal history of transient ischemic attack (TIA), and cerebral infarction without residual deficits: Secondary | ICD-10-CM | POA: Insufficient documentation

## 2022-04-23 DIAGNOSIS — R109 Unspecified abdominal pain: Secondary | ICD-10-CM

## 2022-04-23 DIAGNOSIS — K529 Noninfective gastroenteritis and colitis, unspecified: Secondary | ICD-10-CM

## 2022-04-23 DIAGNOSIS — Z86010 Personal history of colonic polyps: Secondary | ICD-10-CM | POA: Diagnosis not present

## 2022-04-23 DIAGNOSIS — I8500 Esophageal varices without bleeding: Secondary | ICD-10-CM

## 2022-04-23 HISTORY — PX: COLONOSCOPY WITH PROPOFOL: SHX5780

## 2022-04-23 HISTORY — PX: POLYPECTOMY: SHX149

## 2022-04-23 HISTORY — PX: ESOPHAGOGASTRODUODENOSCOPY (EGD) WITH PROPOFOL: SHX5813

## 2022-04-23 LAB — GLUCOSE, CAPILLARY: Glucose-Capillary: 90 mg/dL (ref 70–99)

## 2022-04-23 SURGERY — COLONOSCOPY WITH PROPOFOL
Anesthesia: General

## 2022-04-23 MED ORDER — PROPOFOL 500 MG/50ML IV EMUL
INTRAVENOUS | Status: DC | PRN
  Administered 2022-04-23: 200 ug/kg/min via INTRAVENOUS

## 2022-04-23 MED ORDER — LIDOCAINE HCL (CARDIAC) PF 100 MG/5ML IV SOSY
PREFILLED_SYRINGE | INTRAVENOUS | Status: DC | PRN
  Administered 2022-04-23: 50 mg via INTRATRACHEAL

## 2022-04-23 MED ORDER — PROPOFOL 10 MG/ML IV BOLUS
INTRAVENOUS | Status: DC | PRN
  Administered 2022-04-23: 80 mg via INTRAVENOUS

## 2022-04-23 MED ORDER — LACTATED RINGERS IV SOLN
INTRAVENOUS | Status: DC
Start: 2022-04-23 — End: 2022-04-23

## 2022-04-23 NOTE — Anesthesia Postprocedure Evaluation (Signed)
Anesthesia Post Note  Patient: Thomas Pratt  Procedure(s) Performed: COLONOSCOPY WITH PROPOFOL ESOPHAGOGASTRODUODENOSCOPY (EGD) WITH PROPOFOL POLYPECTOMY INTESTINAL  Patient location during evaluation: Phase II Anesthesia Type: General Level of consciousness: awake and alert and oriented Pain management: pain level controlled Vital Signs Assessment: post-procedure vital signs reviewed and stable Respiratory status: spontaneous breathing, nonlabored ventilation and respiratory function stable Cardiovascular status: blood pressure returned to baseline and stable Postop Assessment: no apparent nausea or vomiting Anesthetic complications: no   No notable events documented.   Last Vitals:  Vitals:   04/23/22 1110 04/23/22 1115  BP: (!) 97/59 103/61  Pulse: 63   Resp: 14   Temp: 36.5 C   SpO2: 96%     Last Pain:  Vitals:   04/23/22 1117  TempSrc:   PainSc: 0-No pain                 Raif Chachere C Colbin Jovel

## 2022-04-23 NOTE — Transfer of Care (Signed)
Immediate Anesthesia Transfer of Care Note  Patient: Thomas Pratt  Procedure(s) Performed: COLONOSCOPY WITH PROPOFOL ESOPHAGOGASTRODUODENOSCOPY (EGD) WITH PROPOFOL POLYPECTOMY INTESTINAL  Patient Location: Short Stay  Anesthesia Type:General  Level of Consciousness: awake, alert , oriented and patient cooperative  Airway & Oxygen Therapy: Patient Spontanous Breathing  Post-op Assessment: Report given to RN, Post -op Vital signs reviewed and stable and Patient moving all extremities  Post vital signs: Reviewed and stable  Last Vitals:  Vitals Value Taken Time  BP    Temp    Pulse    Resp    SpO2      Last Pain:  Vitals:   04/23/22 1040  TempSrc:   PainSc: 0-No pain         Complications: No notable events documented.

## 2022-04-23 NOTE — Interval H&P Note (Signed)
History and Physical Interval Note:  04/23/2022 9:34 AM  Thomas Pratt  has presented today for surgery, with the diagnosis of cirrhosis, histoyr esophageal varices, dysphagia, lower abdominal pain, history colon polyps, frequent stools.  The various methods of treatment have been discussed with the patient and family. After consideration of risks, benefits and other options for treatment, the patient has consented to  Procedure(s) with comments: COLONOSCOPY WITH PROPOFOL (N/A) - 10:30am, asa 3 ESOPHAGOGASTRODUODENOSCOPY (EGD) WITH PROPOFOL (N/A) BALLOON DILATION (N/A) ESOPHAGEAL BANDING (N/A) as a surgical intervention.  The patient's history has been reviewed, patient examined, no change in status, stable for surgery.  I have reviewed the patient's chart and labs.  Questions were answered to the patient's satisfaction.     [name]

## 2022-04-23 NOTE — Op Note (Signed)
[facility] Patient Name: Thomas Pratt Procedure Date: 04/23/2022 10:31 AM MRN: [id number] Date of Birth: 17-Apr-1961 Attending MD: Elon Alas. Abbey Chatters DO CSN: [id number] Age: 61 Admit Type: Outpatient Procedure:                Upper GI endoscopy Indications:              Follow-up of esophageal varices Providers:                Elon Alas. Abbey Chatters, DO, [place] Page, [employer],                            RN, [employer]. Risa Grill, Technician, Bethel Born, Merchant navy officer Referring MD:              Medicines:                See the Anesthesia note for documentation of the                            administered medications Complications:            No immediate complications. Estimated Blood Loss:     Estimated blood loss: none. Procedure:                Pre-Anesthesia Assessment:                           - The anesthesia plan was to use monitored                            anesthesia care (MAC).                           After obtaining informed consent, the endoscope was                            passed under direct vision. Throughout the                            procedure, the patient's blood pressure, pulse, and                            oxygen saturations were monitored continuously. The                            [id number]-[id number] ([id number]) scope was introduced through the                            mouth, and advanced to the second part of duodenum.                            The upper GI endoscopy was accomplished without                            difficulty. The patient  tolerated the procedure                            well. Scope In: 10:44:07 AM Scope Out: 10:46:57 AM Total Procedure Duration: 0 hours 2 minutes 50 seconds  Findings:      Grade I varices were found in the lower third of the esophagus.      Patchy mild inflammation characterized by erythema was found in the       gastric body and in the gastric antrum.      The  duodenal bulb, first portion of the duodenum and second portion of       the duodenum were normal.      A small hiatal hernia was present. Impression:               - Grade I esophageal varices.                           - Gastritis.                           - Normal duodenal bulb, first portion of the                            duodenum and second portion of the duodenum.                           - Small hiatal hernia.                           - No specimens collected. Moderate Sedation:      Per Anesthesia Care Recommendation:           - Patient has a contact number available for                            emergencies. The signs and symptoms of potential                            delayed complications were discussed with the                            patient. Return to normal activities tomorrow.                            Written discharge instructions were provided to the                            patient.                           - Resume previous diet.                           - Continue present medications.                           - Repeat upper endoscopy in 2 years for  surveillance.                           - Return to GI clinic in 6 months. Procedure Code(s):        --- Professional ---                           [phone], Esophagogastroduodenoscopy, flexible,                            transoral; diagnostic, including collection of                            specimen(s) by brushing or washing, when performed                            (separate procedure) Diagnosis Code(s):        --- Professional ---                           I85.00, Esophageal varices without bleeding                           K29.70, Gastritis, unspecified, without bleeding CPT copyright 2019 American Medical Association. All rights reserved. The codes documented in this report are preliminary and upon coder review may  be revised to meet current compliance  requirements. Elon Alas. Abbey Chatters, DO Sylvester Scott Vanderveer, DO 04/23/2022 10:50:06 AM This report has been signed electronically. Number of Addenda: 0

## 2022-04-23 NOTE — Op Note (Signed)
[facility] Patient Name: Thomas Pratt Procedure Date: 04/23/2022 10:30 AM MRN: [id number] Date of Birth: Oct 17, 1960 Attending MD: Elon Alas. Abbey Chatters DO CSN: [id number] Age: 61 Admit Type: Outpatient Procedure:                Colonoscopy Indications:              Surveillance: Personal history of adenomatous                            polyps on last colonoscopy 5 years ago Providers:                Elon Alas. Abbey Chatters, DO, Dwale Page, [employer],                            RN, [employer]. Risa Grill, Technician, Bethel Born, Merchant navy officer Referring MD:              Medicines:                See the Anesthesia note for documentation of the                            administered medications Complications:            No immediate complications. Estimated Blood Loss:     Estimated blood loss was minimal. Procedure:                Pre-Anesthesia Assessment:                           - The anesthesia plan was to use monitored                            anesthesia care (MAC).                           After obtaining informed consent, the colonoscope                            was passed under direct vision. Throughout the                            procedure, the patient's blood pressure, pulse, and                            oxygen saturations were monitored continuously. The                            [id number]-HQ190L ([id number]) scope was introduced through                            the anus and advanced to the the cecum, identified                            by appendiceal orifice and  ileocecal valve. The                            colonoscopy was performed without difficulty. The                            patient tolerated the procedure well. The quality                            of the bowel preparation was evaluated using the                            BBPS [facility] Bowel Preparation Scale) with scores                            of: Right  Colon = 2 (minor amount of residual                            staining, small fragments of stool and/or opaque                            liquid, but mucosa seen well), Transverse Colon = 2                            (minor amount of residual staining, small fragments                            of stool and/or opaque liquid, but mucosa seen                            well) and Left Colon = 2 (minor amount of residual                            staining, small fragments of stool and/or opaque                            liquid, but mucosa seen well). The total BBPS score                            equals 6. The quality of the bowel preparation was                            fair. Scope In: 10:51:04 AM Scope Out: 11:05:14 AM Scope Withdrawal Time: 0 hours 10 minutes 57 seconds  Total Procedure Duration: 0 hours 14 minutes 10 seconds  Findings:      The perianal and digital rectal examinations were normal.      Non-bleeding internal hemorrhoids were found during endoscopy.      Four sessile polyps were found in the transverse colon. The polyps were       4 to 8 mm in size. These polyps were removed with a cold snare.       Resection and retrieval were complete.      The exam was otherwise without abnormality. Impression:               -  Preparation of the colon was fair.                           - Non-bleeding internal hemorrhoids.                           - Four 4 to 8 mm polyps in the transverse colon,                            removed with a cold snare. Resected and retrieved.                           - The examination was otherwise normal. Moderate Sedation:      Per Anesthesia Care Recommendation:           - Patient has a contact number available for                            emergencies. The signs and symptoms of potential                            delayed complications were discussed with the                            patient. Return to normal activities tomorrow.                             Written discharge instructions were provided to the                            patient.                           - Resume previous diet.                           - Continue present medications.                           - Await pathology results.                           - Repeat colonoscopy in 5 years for surveillance.                           - Return to GI clinic in 6 months. Procedure Code(s):        --- Professional ---                           [id number], Colonoscopy, flexible; with removal of                            tumor(s), polyp(s), or other lesion(s) by snare                            technique Diagnosis Code(s):        ---  Professional ---                           Z86.010, Personal history of colonic polyps                           K64.8, Other hemorrhoids                           K63.5, Polyp of colon CPT copyright 2019 American Medical Association. All rights reserved. The codes documented in this report are preliminary and upon coder review may  be revised to meet current compliance requirements. Elon Alas. Abbey Chatters, DO Hopewell Concho, DO 04/23/2022 11:07:45 AM This report has been signed electronically. Number of Addenda: 0

## 2022-04-23 NOTE — Discharge Instructions (Addendum)
EGD Discharge instructions Please read the instructions outlined below and refer to this sheet in the next few weeks. These discharge instructions provide you with general information on caring for yourself after you leave the hospital. Your doctor may also give you specific instructions. While your treatment has been planned according to the most current medical practices available, unavoidable complications occasionally occur. If you have any problems or questions after discharge, please call your doctor. ACTIVITY You may resume your regular activity but move at a slower pace for the next 24 hours.  Take frequent rest periods for the next 24 hours.  Walking will help expel (get rid of) the air and reduce the bloated feeling in your abdomen.  No driving for 24 hours (because of the anesthesia (medicine) used during the test).  You may shower.  Do not sign any important legal documents or operate any machinery for 24 hours (because of the anesthesia used during the test).  NUTRITION Drink plenty of fluids.  You may resume your normal diet.  Begin with a light meal and progress to your normal diet.  Avoid alcoholic beverages for 24 hours or as instructed by your caregiver.  MEDICATIONS You may resume your normal medications unless your caregiver tells you otherwise.  WHAT YOU CAN EXPECT TODAY You may experience abdominal discomfort such as a feeling of fullness or "gas" pains.  FOLLOW-UP Your doctor will discuss the results of your test with you.  SEEK IMMEDIATE MEDICAL ATTENTION IF ANY OF THE FOLLOWING OCCUR: Excessive nausea (feeling sick to your stomach) and/or vomiting.  Severe abdominal pain and distention (swelling).  Trouble swallowing.  Temperature over 101 F (37.8 C).  Rectal bleeding or vomiting of blood.    Colonoscopy Discharge Instructions  Read the instructions outlined below and refer to this sheet in the next few weeks. These discharge instructions provide you with  general information on caring for yourself after you leave the hospital. Your doctor may also give you specific instructions. While your treatment has been planned according to the most current medical practices available, unavoidable complications occasionally occur.   ACTIVITY You may resume your regular activity, but move at a slower pace for the next 24 hours.  Take frequent rest periods for the next 24 hours.  Walking will help get rid of the air and reduce the bloated feeling in your belly (abdomen).  No driving for 24 hours (because of the medicine (anesthesia) used during the test).   Do not sign any important legal documents or operate any machinery for 24 hours (because of the anesthesia used during the test).  NUTRITION Drink plenty of fluids.  You may resume your normal diet as instructed by your doctor.  Begin with a light meal and progress to your normal diet. Heavy or fried foods are harder to digest and may make you feel sick to your stomach (nauseated).  Avoid alcoholic beverages for 24 hours or as instructed.  MEDICATIONS You may resume your normal medications unless your doctor tells you otherwise.  WHAT YOU CAN EXPECT TODAY Some feelings of bloating in the abdomen.  Passage of more gas than usual.  Spotting of blood in your stool or on the toilet paper.  IF YOU HAD POLYPS REMOVED DURING THE COLONOSCOPY: No aspirin products for 7 days or as instructed.  No alcohol for 7 days or as instructed.  Eat a soft diet for the next 24 hours.  FINDING OUT THE RESULTS OF YOUR TEST Not all test results are available  during your visit. If your test results are not back during the visit, make an appointment with your caregiver to find out the results. Do not assume everything is normal if you have not heard from your caregiver or the medical facility. It is important for you to follow up on all of your test results.  SEEK IMMEDIATE MEDICAL ATTENTION IF: You have more than a spotting of  blood in your stool.  Your belly is swollen (abdominal distention).  You are nauseated or vomiting.  You have a temperature over 101.  You have abdominal pain or discomfort that is severe or gets worse throughout the day.   Your EGD showed very small esophageal varices.  I did not need to treat these today.  Continue on nadolol.  Repeat EGD in 2 years.  Your stomach and small bowel otherwise appeared normal.  Your colonoscopy revealed 4 polyp(s) which I removed successfully. Await pathology results, my office will contact you. I recommend repeating colonoscopy in 5 years for surveillance purposes.   Follow-up in GI clinic in 6 months.  I hope you have a great rest of your week!  Elon Alas. Abbey Chatters, D.O. Gastroenterology and Hepatology [facility]

## 2022-04-23 NOTE — Anesthesia Preprocedure Evaluation (Signed)
Anesthesia Evaluation  Patient identified by MRN, date of birth, ID band Patient awake    Reviewed: Allergy & Precautions, NPO status , Patient's Chart, lab work & pertinent test results, reviewed documented beta blocker date and time   Airway Mallampati: II  TM Distance: >3 FB Neck ROM: Full    Dental  (+) Dental Advisory Given, Missing, Poor Dentition   Pulmonary    Pulmonary exam normal breath sounds clear to auscultation       Cardiovascular Exercise Tolerance: Good hypertension, Pt. on medications and Pt. on home beta blockers Normal cardiovascular exam Rhythm:Regular Rate:Normal     Neuro/Psych PSYCHIATRIC DISORDERS Anxiety Depression  Neuromuscular disease CVA, Residual Symptoms    GI/Hepatic GERD  Medicated and Controlled,(+) Cirrhosis   Esophageal Varices  substance abuse  alcohol use, Hepatitis -  Endo/Other  diabetes, Well Controlled, Type 2, Oral Hypoglycemic Agents, Insulin Dependent  Renal/GU      Musculoskeletal negative musculoskeletal ROS (+)   Abdominal   Peds  Hematology negative hematology ROS (+)   Anesthesia Other Findings   Reproductive/Obstetrics negative OB ROS                            Anesthesia Physical Anesthesia Plan  ASA: 3  Anesthesia Plan: General   Post-op Pain Management: Minimal or no pain anticipated   Induction: Intravenous  PONV Risk Score and Plan: Propofol infusion  Airway Management Planned: Nasal Cannula and Natural Airway  Additional Equipment:   Intra-op Plan:   Post-operative Plan:   Informed Consent: I have reviewed the patients History and Physical, chart, labs and discussed the procedure including the risks, benefits and alternatives for the proposed anesthesia with the patient or authorized representative who has indicated his/her understanding and acceptance.     Dental advisory given  Plan Discussed with: CRNA and  Surgeon  Anesthesia Plan Comments:         Anesthesia Quick Evaluation

## 2022-04-24 LAB — SURGICAL PATHOLOGY

## 2022-04-25 ENCOUNTER — Ambulatory Visit (HOSPITAL_COMMUNITY)
Admission: RE | Admit: 2022-04-25 | Discharge: 2022-04-25 | Disposition: A | Discharge status: Home / Self Care | Payer: Medicare Other | Source: Ambulatory Visit | Attending: Gastroenterology | Admitting: Gastroenterology

## 2022-04-25 DIAGNOSIS — K7460 Unspecified cirrhosis of liver: Secondary | ICD-10-CM | POA: Insufficient documentation

## 2022-04-26 ENCOUNTER — Ambulatory Visit: Payer: Medicare Other | Admitting: Family

## 2022-04-29 ENCOUNTER — Encounter (HOSPITAL_COMMUNITY): Payer: Self-pay | Admitting: Internal Medicine

## 2022-04-30 ENCOUNTER — Encounter: Payer: Self-pay | Admitting: Family

## 2022-04-30 ENCOUNTER — Ambulatory Visit (INDEPENDENT_AMBULATORY_CARE_PROVIDER_SITE_OTHER): Payer: Medicare Other | Admitting: Family

## 2022-04-30 VITALS — BP 105/66 | HR 56 | Temp 97.2°F | Ht 68.0 in | Wt 169.0 lb

## 2022-04-30 DIAGNOSIS — E785 Hyperlipidemia, unspecified: Secondary | ICD-10-CM

## 2022-04-30 DIAGNOSIS — Z794 Long term (current) use of insulin: Secondary | ICD-10-CM | POA: Diagnosis not present

## 2022-04-30 DIAGNOSIS — K219 Gastro-esophageal reflux disease without esophagitis: Secondary | ICD-10-CM

## 2022-04-30 DIAGNOSIS — E1169 Type 2 diabetes mellitus with other specified complication: Secondary | ICD-10-CM | POA: Diagnosis not present

## 2022-04-30 DIAGNOSIS — K7030 Alcoholic cirrhosis of liver without ascites: Secondary | ICD-10-CM | POA: Diagnosis not present

## 2022-04-30 DIAGNOSIS — E1142 Type 2 diabetes mellitus with diabetic polyneuropathy: Secondary | ICD-10-CM

## 2022-04-30 DIAGNOSIS — I10 Essential (primary) hypertension: Secondary | ICD-10-CM

## 2022-04-30 DIAGNOSIS — F1011 Alcohol abuse, in remission: Secondary | ICD-10-CM

## 2022-04-30 DIAGNOSIS — F320 Major depressive disorder, single episode, mild: Secondary | ICD-10-CM

## 2022-04-30 DIAGNOSIS — Z23 Encounter for immunization: Secondary | ICD-10-CM | POA: Diagnosis not present

## 2022-04-30 LAB — BAYER DCA HB A1C WAIVED: HB A1C (BAYER DCA - WAIVED): 6.6 % — ABNORMAL HIGH (ref 4.8–5.6)

## 2022-04-30 NOTE — Patient Instructions (Signed)
Health Maintenance, Male Adopting a healthy lifestyle and getting preventive care are important in promoting health and wellness. Ask your health care provider about: The right schedule for you to have regular tests and exams. Things you can do on your own to prevent diseases and keep yourself healthy. What should I know about diet, weight, and exercise? Eat a healthy diet  Eat a diet that includes plenty of vegetables, fruits, low-fat dairy products, and lean protein. Do not eat a lot of foods that are high in solid fats, added sugars, or sodium. Maintain a healthy weight Body mass index (BMI) is a measurement that can be used to identify possible weight problems. It estimates body fat based on height and weight. Your health care provider can help determine your BMI and help you achieve or maintain a healthy weight. Get regular exercise Get regular exercise. This is one of the most important things you can do for your health. Most adults should: Exercise for at least 150 minutes each week. The exercise should increase your heart rate and make you sweat (moderate-intensity exercise). Do strengthening exercises at least twice a week. This is in addition to the moderate-intensity exercise. Spend less time sitting. Even light physical activity can be beneficial. Watch cholesterol and blood lipids Have your blood tested for lipids and cholesterol at 62 years of age, then have this test every 5 years. You may need to have your cholesterol levels checked more often if: Your lipid or cholesterol levels are high. You are older than 62 years of age. You are at high risk for heart disease. What should I know about cancer screening? Many types of cancers can be detected early and may often be prevented. Depending on your health history and family history, you may need to have cancer screening at various ages. This may include screening for: Colorectal cancer. Prostate cancer. Skin cancer. Lung  cancer. What should I know about heart disease, diabetes, and high blood pressure? Blood pressure and heart disease High blood pressure causes heart disease and increases the risk of stroke. This is more likely to develop in people who have high blood pressure readings or are overweight. Talk with your health care provider about your target blood pressure readings. Have your blood pressure checked: Every 3-5 years if you are 18-39 years of age. Every year if you are 40 years old or older. If you are between the ages of 65 and 75 and are a current or former smoker, ask your health care provider if you should have a one-time screening for abdominal aortic aneurysm (AAA). Diabetes Have regular diabetes screenings. This checks your fasting blood sugar level. Have the screening done: Once every three years after age 45 if you are at a normal weight and have a low risk for diabetes. More often and at a younger age if you are overweight or have a high risk for diabetes. What should I know about preventing infection? Hepatitis B If you have a higher risk for hepatitis B, you should be screened for this virus. Talk with your health care provider to find out if you are at risk for hepatitis B infection. Hepatitis C Blood testing is recommended for: Everyone born from 1945 through 1965. Anyone with known risk factors for hepatitis C. Sexually transmitted infections (STIs) You should be screened each year for STIs, including gonorrhea and chlamydia, if: You are sexually active and are younger than 62 years of age. You are older than 62 years of age and your   health care provider tells you that you are at risk for this type of infection. Your sexual activity has changed since you were last screened, and you are at increased risk for chlamydia or gonorrhea. Ask your health care provider if you are at risk. Ask your health care provider about whether you are at high risk for HIV. Your health care provider  may recommend a prescription medicine to help prevent HIV infection. If you choose to take medicine to prevent HIV, you should first get tested for HIV. You should then be tested every 3 months for as long as you are taking the medicine. Follow these instructions at home: Alcohol use Do not drink alcohol if your health care provider tells you not to drink. If you drink alcohol: Limit how much you have to 0-2 drinks a day. Know how much alcohol is in your drink. In the [place] one drink equals one 12 oz bottle of beer (355 mL), one 5 oz glass of wine (148 mL), or one 1 oz glass of hard liquor (44 mL). Lifestyle Do not use any products that contain nicotine or tobacco. These products include cigarettes, chewing tobacco, and vaping devices, such as e-cigarettes. If you need help quitting, ask your health care provider. Do not use street drugs. Do not share needles. Ask your health care provider for help if you need support or information about quitting drugs. General instructions Schedule regular health, dental, and eye exams. Stay current with your vaccines. Tell your health care provider if: You often feel depressed. You have ever been abused or do not feel safe at home. Summary Adopting a healthy lifestyle and getting preventive care are important in promoting health and wellness. Follow your health care provider's instructions about healthy diet, exercising, and getting tested or screened for diseases. Follow your health care provider's instructions on monitoring your cholesterol and blood pressure. This information is not intended to replace advice given to you by your health care provider. Make sure you discuss any questions you have with your health care provider. Document Revised: 11/27/2020 Document Reviewed: 11/27/2020 Elsevier Patient Education  2023 Elsevier Inc.  

## 2022-04-30 NOTE — Progress Notes (Signed)
Subjective:    Patient ID: Thomas Pratt, male    DOB: [birth year], 61 y.o.   MRN: [id number]  Chief Complaint  Patient presents with   Medical Management of Chronic Issues   PT presents to the office today for chronic follow up. He is followed by our clinical pharmacists  for DM education. He uses libre continuous glucose monitoring which has helped decrease his A1C.    He is followed by GI  for cirrhosis of liver. He has been sober since 03/2016. He had a colonoscopy and EGD on 04/23/22.   He is complaining of bilateral foot and leg pain when walking. States it is a numbing pain of   7 out 10 when walking.   Diabetes He presents for his follow-up diabetic visit. He has type 2 diabetes mellitus. Associated symptoms include foot paresthesias. Pertinent negatives for diabetes include no blurred vision. Symptoms are stable. Diabetic complications include peripheral neuropathy. Risk factors for coronary artery disease include dyslipidemia, diabetes mellitus, male sex, hypertension and sedentary lifestyle. He is following a generally healthy diet. His overall blood glucose range is 110-130 mg/dl. Eye exam is not current.  Hypertension This is a chronic problem. The problem has been resolved since onset. Associated symptoms include malaise/fatigue. Pertinent negatives include no blurred vision, peripheral edema or shortness of breath. Risk factors for coronary artery disease include dyslipidemia, diabetes mellitus, male gender and sedentary lifestyle. The current treatment provides moderate improvement. There is no history of heart failure.  Gastroesophageal Reflux He complains of belching and heartburn. This is a chronic problem. The current episode started more than 1 year ago. The problem occurs occasionally. He has tried a PPI for the symptoms. The treatment provided moderate relief.  Hyperlipidemia This is a chronic problem. The current episode started more than 1 year ago. The problem is  uncontrolled. Recent lipid tests were reviewed and are high. Pertinent negatives include no shortness of breath. Current antihyperlipidemic treatment includes statins. The current treatment provides moderate improvement of lipids. Risk factors for coronary artery disease include dyslipidemia, diabetes mellitus, hypertension and a sedentary lifestyle.  Depression        This is a chronic problem.  The current episode started more than 1 year ago.   The problem occurs intermittently.  Associated symptoms include no helplessness, no hopelessness and not sad.     Review of Systems  Constitutional:  Positive for malaise/fatigue.  Eyes:  Negative for blurred vision.  Respiratory:  Negative for shortness of breath.   Gastrointestinal:  Positive for heartburn.  Psychiatric/Behavioral:  Positive for depression.   All other systems reviewed and are negative.      Objective:   Physical Exam Vitals reviewed.  Constitutional:      General: He is not in acute distress.    Appearance: He is well-developed.  HENT:     Head: Normocephalic.     Right Ear: Tympanic membrane normal.     Left Ear: Tympanic membrane normal.  Eyes:     General:        Right eye: No discharge.        Left eye: No discharge.     Pupils: Pupils are equal, round, and reactive to light.  Neck:     Thyroid: No thyromegaly.  Cardiovascular:     Rate and Rhythm: Normal rate and regular rhythm.     Heart sounds: Normal heart sounds. No murmur heard. Pulmonary:     Effort: Pulmonary effort is normal. No respiratory distress.  Breath sounds: Normal breath sounds. No wheezing.  Abdominal:     General: Bowel sounds are normal. There is no distension.     Palpations: Abdomen is soft.     Tenderness: There is abdominal tenderness (mild lower abdominal tenderness).  Musculoskeletal:        General: No tenderness. Normal range of motion.     Cervical back: Normal range of motion and neck supple.  Skin:    General: Skin is  warm and dry.     Findings: No erythema or rash.  Neurological:     Mental Status: He is alert and oriented to person, place, and time.     Cranial Nerves: No cranial nerve deficit.     Deep Tendon Reflexes: Reflexes are normal and symmetric.  Psychiatric:        Behavior: Behavior normal.        Thought Content: Thought content normal.        Judgment: Judgment normal.       BP 105/66   Pulse (!) 56   Temp (!) 97.2 F (36.2 C) (Temporal)   Ht '5\' 8"'$  (1.727 m)   Wt 169 lb (76.7 kg)   BMI 25.70 kg/m      Assessment & Plan:  Thomas Pratt comes in today with chief complaint of Medical Management of Chronic Issues   Diagnosis and orders addressed:  1. Primary hypertension  2. Alcoholic cirrhosis of liver without ascites ([place])  3. Gastroesophageal reflux disease, unspecified whether esophagitis present  4. Diabetic polyneuropathy associated with type 2 diabetes mellitus ([place])  5. Type 2 diabetes mellitus with other specified complication, with long-term current use of insulin ([facility]) - Bayer DCA Hb A1c Waived  6. Hyperlipidemia associated with type 2 diabetes mellitus ([place])  7. Depression, major, single episode, mild ([place])   8. History of ETOH abuse   Labs pending Health Maintenance reviewed Diet and exercise encouraged  Follow up plan: 6 months    Evelina Dun, FNP

## 2022-05-01 ENCOUNTER — Telehealth: Payer: Self-pay | Admitting: Family

## 2022-05-04 DIAGNOSIS — E119 Type 2 diabetes mellitus without complications: Secondary | ICD-10-CM | POA: Diagnosis not present

## 2022-05-08 NOTE — Telephone Encounter (Signed)
Returning call no answer been 7 days with no return call will close encounter

## 2022-06-04 DIAGNOSIS — E119 Type 2 diabetes mellitus without complications: Secondary | ICD-10-CM | POA: Diagnosis not present

## 2022-06-18 ENCOUNTER — Other Ambulatory Visit: Payer: Self-pay | Admitting: Family

## 2022-06-18 DIAGNOSIS — E1169 Type 2 diabetes mellitus with other specified complication: Secondary | ICD-10-CM

## 2022-06-21 ENCOUNTER — Ambulatory Visit (INDEPENDENT_AMBULATORY_CARE_PROVIDER_SITE_OTHER): Payer: Medicare Other | Admitting: Family

## 2022-06-21 ENCOUNTER — Encounter: Payer: Self-pay | Admitting: Family

## 2022-06-21 VITALS — BP 119/77 | HR 56 | Temp 97.4°F | Ht 68.0 in | Wt 169.0 lb

## 2022-06-21 DIAGNOSIS — B3742 Candidal balanitis: Secondary | ICD-10-CM

## 2022-06-21 MED ORDER — NYSTATIN 100000 UNIT/GM EX POWD: 1.0000 | Freq: Three times a day (TID) | CUTANEOUS | 2 refills | Status: DC

## 2022-06-21 MED ORDER — NYSTATIN 100000 UNIT/GM EX CREA: 1.0000 | TOPICAL_CREAM | Freq: Two times a day (BID) | CUTANEOUS | 2 refills | Status: DC

## 2022-06-21 MED ORDER — FLUCONAZOLE 150 MG PO TABS: 150.0000 mg | ORAL_TABLET | ORAL | 0 refills | Status: DC | PRN

## 2022-06-21 NOTE — Patient Instructions (Signed)
Skin Yeast Infection  A skin yeast infection is a condition in which there is an overgrowth of yeast (Candida) that normally lives on the skin. This condition usually occurs in areas of the skin that are constantly warm and moist, such as the skin under the breasts or armpits, or in the groin and other body folds. What are the causes? This condition is caused by a change in the normal balance of the yeast that live on the skin. What increases the risk? You are more likely to develop this condition if you: Are obese. Are pregnant. Are 62 years of age or older. Wear tight clothing. Have any of the following conditions: Diabetes. Malnutrition. A weak body defense system (immune system). Take medicines such as: Birth control pills. Antibiotics. Steroid medicines. What are the signs or symptoms? The most common symptom of this condition is itchiness in the affected area. Other symptoms include: A red, swollen area of the skin. Bumps on the skin. How is this diagnosed? This condition is diagnosed with a medical history and physical exam. Your health care provider may check for yeast by taking scrapings of the skin to be viewed under a microscope. How is this treated? This condition is treated with medicine. Medicines may be prescribed or available over the counter. The medicines may be: Taken by mouth (orally). Applied as a cream or powder to your skin. Follow these instructions at home:  Take or apply over-the-counter and prescription medicines only as told by your health care provider. Maintain a healthy weight. If you need help losing weight, talk with your health care provider. Keep your skin clean and dry. Wear loose-fitting clothing. If you have diabetes, keep your blood sugar under control. Keep all follow-up visits. This is important. Contact a health care provider if: Your symptoms go away and then come back. Your symptoms do not get better with treatment. Your symptoms get  worse. Your rash spreads. You have a fever or chills. You have new symptoms. You have new warmth or redness of your skin. Your rash is painful or bleeding. Summary A skin yeast infection is a condition in which there is an overgrowth of yeast (Candida) that normally lives on the skin. Take or apply over-the-counter and prescription medicines only as told by your health care provider. Keep your skin clean and dry. Contact a health care provider if your symptoms do not get better with treatment. This information is not intended to replace advice given to you by your health care provider. Make sure you discuss any questions you have with your health care provider. Document Revised: 09/26/2020 Document Reviewed: 09/26/2020 Elsevier Patient Education  2023 Elsevier Inc.  

## 2022-06-21 NOTE — Progress Notes (Signed)
Subjective:    Patient ID: Thomas Pratt, male    DOB: [birth year], 61 y.o.   MRN: [id number]  Chief Complaint  Patient presents with   Personal Problem   Pt presents to the office today for a rash on his penis that started a couple of weeks ago. States he has had this before and used cream and helped. Unsure of the name of the medication.  Rash This is a new problem. The current episode started 1 to 4 weeks ago. The problem has been gradually worsening since onset. The affected locations include the groin. The rash is characterized by itchiness and redness. He was exposed to nothing. Treatments tried: alcohol. The treatment provided no relief.      Review of Systems  Skin:  Positive for rash.  All other systems reviewed and are negative.      Objective:   Physical Exam Vitals reviewed.  Constitutional:      General: He is not in acute distress.    Appearance: He is well-developed.  HENT:     Head: Normocephalic.  Eyes:     General:        Right eye: No discharge.        Left eye: No discharge.     Pupils: Pupils are equal, round, and reactive to light.  Neck:     Thyroid: No thyromegaly.  Cardiovascular:     Rate and Rhythm: Normal rate and regular rhythm.     Heart sounds: Normal heart sounds. No murmur heard. Pulmonary:     Effort: Pulmonary effort is normal. No respiratory distress.     Breath sounds: Normal breath sounds. No wheezing.  Abdominal:     General: Bowel sounds are normal. There is no distension.     Palpations: Abdomen is soft.     Tenderness: There is no abdominal tenderness.  Genitourinary:      Comments: Erythemas, white discharge Musculoskeletal:        General: No tenderness. Normal range of motion.     Cervical back: Normal range of motion and neck supple.  Skin:    General: Skin is warm and dry.     Findings: No erythema or rash.  Neurological:     Mental Status: He is alert and oriented to person, place, and time.     Cranial  Nerves: No cranial nerve deficit.     Deep Tendon Reflexes: Reflexes are normal and symmetric.  Psychiatric:        Behavior: Behavior normal.        Thought Content: Thought content normal.        Judgment: Judgment normal.       BP 119/77   Pulse (!) 56   Temp (!) 97.4 F (36.3 C) (Temporal)   Ht '5\' 8"'$  (1.727 m)   Wt 169 lb (76.7 kg)   BMI 25.70 kg/m      Assessment & Plan:  Makye Radle comes in today with chief complaint of Personal Problem   Diagnosis and orders addressed:  1. Candidiasis of penis Keep clean and dry After urinationF wipe head of penis with wipe, if continues may need to take off of [place] Follow up if symptoms worsen or do not improve  - fluconazole (DIFLUCAN) 150 MG tablet; Take 1 tablet (150 mg total) by mouth every three (3) days as needed.  Dispense: 3 tablet; Refill: 0 - nystatin (MYCOSTATIN/NYSTOP) powder; Apply 1 Application topically 3 (three) times daily.  Dispense: 60 g; Refill: 2 -  nystatin cream (MYCOSTATIN); Apply 1 Application topically 2 (two) times daily.  Dispense: 60 g; Refill: 2   Evelina Dun, FNP

## 2022-07-10 ENCOUNTER — Other Ambulatory Visit: Payer: Self-pay | Admitting: Gastroenterology

## 2022-07-10 DIAGNOSIS — I8510 Secondary esophageal varices without bleeding: Secondary | ICD-10-CM

## 2022-07-10 DIAGNOSIS — K766 Portal hypertension: Secondary | ICD-10-CM

## 2022-07-10 DIAGNOSIS — K7460 Unspecified cirrhosis of liver: Secondary | ICD-10-CM

## 2022-07-10 DIAGNOSIS — F1011 Alcohol abuse, in remission: Secondary | ICD-10-CM

## 2022-07-12 ENCOUNTER — Telehealth (INDEPENDENT_AMBULATORY_CARE_PROVIDER_SITE_OTHER): Payer: Medicare Other | Admitting: Family

## 2022-07-12 ENCOUNTER — Encounter: Payer: Self-pay | Admitting: Family

## 2022-07-12 DIAGNOSIS — R6889 Other general symptoms and signs: Secondary | ICD-10-CM

## 2022-07-12 MED ORDER — OSELTAMIVIR PHOSPHATE 75 MG PO CAPS: 75.0000 mg | ORAL_CAPSULE | Freq: Two times a day (BID) | ORAL | 0 refills | Status: DC

## 2022-07-12 MED ORDER — BENZONATATE 200 MG PO CAPS: 200.0000 mg | ORAL_CAPSULE | Freq: Three times a day (TID) | ORAL | 1 refills | Status: DC | PRN

## 2022-07-12 NOTE — Progress Notes (Signed)
Virtual Visit Consent   Thomas Pratt, you are scheduled for a virtual visit with a Thomas Pratt provider today. Just as with appointments in the office, your consent must be obtained to participate. Your consent will be active for this visit and any virtual visit you may have with one of our providers in the next 365 days. If you have a MyChart account, a copy of this consent can be sent to you electronically.  As this is a virtual visit, video technology does not allow for your provider to perform a traditional examination. This may limit your provider's ability to fully assess your condition. If your provider identifies any concerns that need to be evaluated in person or the need to arrange testing (such as labs, EKG, etc.), we will make arrangements to do so. Although advances in technology are sophisticated, we cannot ensure that it will always work on either your end or our end. If the connection with a video visit is poor, the visit may have to be switched to a telephone visit. With either a video or telephone visit, we are not always able to ensure that we have a secure connection.  By engaging in this virtual visit, you consent to the provision of healthcare and authorize for your insurance to be billed (if applicable) for the services provided during this visit. Depending on your insurance coverage, you may receive a charge related to this service.  I need to obtain your verbal consent now. Are you willing to proceed with your visit today? [name] has provided verbal consent on 07/12/2022 for a virtual visit (video or telephone). Christy Hawks, FNP  Date: 07/12/2022 12:32 PM  Virtual Visit via Video Note   I, Christy Hawks, connected with  Thomas Pratt  ([id number], 01/19/1961) on 07/12/22 at 11:55 AM EST by a video-enabled telemedicine application and verified that I am speaking with the correct person using two identifiers.  Location: Patient: Virtual Visit Location  Patient: Home Provider: Virtual Visit Location Provider: Home Office   I discussed the limitations of evaluation and management by telemedicine and the availability of in person appointments. The patient expressed understanding and agreed to proceed.    History of Present Illness: Thomas Pratt is a 61 y.o. who identifies as a male who was assigned male at birth, and is being seen today for flu like symptoms.  HPI: Cough This is a new problem. The current episode started yesterday. The problem has been gradually worsening. The problem occurs every few minutes. The cough is Non-productive. Associated symptoms include chills, ear congestion, a fever, headaches, myalgias, nasal congestion, rhinorrhea and shortness of breath. Pertinent negatives include no ear pain, sore throat or wheezing. He has tried OTC cough suppressant for the symptoms.    Problems:  Patient Active Problem List   Diagnosis Date Noted   H/O adenomatous polyp of colon 04/15/2022   Fatigue 02/18/2022   Nausea 10/16/2021   Diabetic polyneuropathy associated with type 2 diabetes mellitus (HCC) 05/01/2021   Diabetes mellitus (HCC) 10/25/2020   Esophageal varices (HCC) 08/17/2020   Portal hypertension (HCC) 08/17/2020   Gastroesophageal reflux disease 07/13/2020   GI bleed 12/22/2019   Depression, major, single episode, mild (HCC) 05/20/2019   Dysphagia 03/01/2019   CVA (cerebral vascular accident) (HCC) 11/08/2018   History of ETOH abuse 02/25/2018   Primary biliary cholangitis (HCC) 11/05/2017   Hyperlipidemia associated with type 2 diabetes mellitus (HCC) 10/30/2017   Abnormal LFTs 09/16/2017   Calculus of gallbladder without cholecystitis without obstruction      Inguinal hernia 11/14/2016   Cirrhosis ([place]) 05/21/2016   Hypoalbuminemia 05/21/2016   Hyponatremia 05/21/2016   Hypokalemia 05/21/2016   HTN (hypertension) 37/85/8850   ALC (alcoholic liver cirrhosis) ([place]) 05/21/2016    Allergies: No Known  Allergies Medications:  Current Outpatient Medications:    benzonatate (TESSALON) 200 MG capsule, Take 1 capsule (200 mg total) by mouth 3 (three) times daily as needed., Disp: 30 capsule, Rfl: 1   oseltamivir (TAMIFLU) 75 MG capsule, Take 1 capsule (75 mg total) by mouth 2 (two) times daily., Disp: 10 capsule, Rfl: 0   Accu-Chek Softclix Lancets lancets, Test BS 4 (four) times daily. Dx E11.69 (Patient taking differently: 1 each by Other route 4 (four) times daily as needed. Test BS 4 (four) times daily. Dx E11.69), Disp: 400 each, Rfl: 3   atorvastatin (LIPITOR) 10 MG tablet, Take 1 tablet (10 mg total) by mouth daily., Disp: 90 tablet, Rfl: 1   Blood Glucose Monitoring Suppl (ACCU-CHEK GUIDE) w/Device KIT, Test BS 4 (four) times daily. Dx E11.69, Disp: 1 kit, Rfl: 0   cetirizine (ZYRTEC) 10 MG tablet, Take 1 tablet (10 mg total) by mouth daily., Disp: 90 tablet, Rfl: 1   Continuous Blood Gluc Sensor (FREESTYLE LIBRE 2 SENSOR) MISC, Use to test blood sugars 6x daily as directed, Disp: 2 each, Rfl: 3   dapagliflozin propanediol (FARXIGA) 10 MG TABS tablet, Take 1 tablet (10 mg total) by mouth daily before breakfast., Disp: 90 tablet, Rfl: 1   dexlansoprazole (DEXILANT) 60 MG capsule, Take 1 capsule (60 mg total) by mouth daily., Disp: 90 capsule, Rfl: 1   diclofenac Sodium (VOLTAREN) 1 % GEL, Apply 2 g topically 4 (four) times daily., Disp: 100 g, Rfl: 0   escitalopram (LEXAPRO) 10 MG tablet, Take 1 tablet (10 mg total) by mouth daily., Disp: 90 tablet, Rfl: 1   fluconazole (DIFLUCAN) 150 MG tablet, Take 1 tablet (150 mg total) by mouth every three (3) days as needed., Disp: 3 tablet, Rfl: 0   fluticasone (FLONASE) 50 MCG/ACT nasal spray, Use 2 spray(s) in each nostril once daily (Patient taking differently: Place 2 sprays into both nostrils daily as needed for allergies.), Disp: 16 g, Rfl: 5   furosemide (LASIX) 20 MG tablet, Take 1 tablet by mouth once daily, Disp: 90 tablet, Rfl: 3   gabapentin  (NEURONTIN) 800 MG tablet, Take 1 tablet (800 mg total) by mouth 3 (three) times daily., Disp: 270 tablet, Rfl: 1   glucose blood (ACCU-CHEK GUIDE) test strip, Test BS 4 (four) times daily. Dx E11.69, Disp: 400 each, Rfl: 3   Insulin Pen Needle (B-D UF III MINI PEN NEEDLES) 31G X 5 MM MISC, USE WITH TRESIBA DAILY, Disp: 100 each, Rfl: 3   metFORMIN (GLUCOPHAGE-XR) 750 MG 24 hr tablet, Take 2 tablets by mouth once daily with breakfast, Disp: 180 tablet, Rfl: 1   nadolol (CORGARD) 40 MG tablet, Take 1 tablet by mouth once daily, Disp: 90 tablet, Rfl: 3   nystatin (MYCOSTATIN/NYSTOP) powder, Apply 1 Application topically 3 (three) times daily., Disp: 60 g, Rfl: 2   nystatin cream (MYCOSTATIN), Apply 1 Application topically 2 (two) times daily., Disp: 60 g, Rfl: 2   rifaximin (XIFAXAN) 550 MG TABS tablet, Take 1 tablet (550 mg total) by mouth 2 (two) times daily. (Patient taking differently: Take 550 mg by mouth daily.), Disp: 180 tablet, Rfl: 3   spironolactone (ALDACTONE) 50 MG tablet, Take 1 tablet (50 mg total) by mouth daily., Disp: 90 tablet, Rfl: 1  TRESIBA FLEXTOUCH 100 UNIT/ML FlexTouch Pen, INJECT 25 UNITS SUBCUTANEOUSLY AT BEDTIME, Disp: 15 mL, Rfl: 0   VITAMIN D PO, Take 1 capsule by mouth daily. , Disp: , Rfl:   Observations/Objective: Patient is well-developed, well-nourished in no acute distress.  Resting comfortably  at home.  Head is normocephalic, atraumatic.  No labored breathing.  Speech is clear and coherent with logical content.  Patient is alert and oriented at baseline.  Nasal congestion   Assessment and Plan: 1. Flu-like symptoms - oseltamivir (TAMIFLU) 75 MG capsule; Take 1 capsule (75 mg total) by mouth 2 (two) times daily.  Dispense: 10 capsule; Refill: 0 - benzonatate (TESSALON) 200 MG capsule; Take 1 capsule (200 mg total) by mouth 3 (three) times daily as needed.  Dispense: 30 capsule; Refill: 1  Force fluids Rest Alternate tylenol and motrin  Droplet  precautions  Follow up if symptoms worsen or do not improve   Follow Up Instructions: I discussed the assessment and treatment plan with the patient. The patient was provided an opportunity to ask questions and all were answered. The patient agreed with the plan and demonstrated an understanding of the instructions.  A copy of instructions were sent to the patient via MyChart unless otherwise noted below.    The patient was advised to call back or seek an in-person evaluation if the symptoms worsen or if the condition fails to improve as anticipated.  Time:  I spent 12 minutes with the patient via telehealth technology discussing the above problems/concerns.    Christy Hawks, FNP  

## 2022-07-25 LAB — HM DIABETES EYE EXAM

## 2022-08-05 ENCOUNTER — Other Ambulatory Visit: Payer: Self-pay | Admitting: Family

## 2022-08-05 DIAGNOSIS — E1169 Type 2 diabetes mellitus with other specified complication: Secondary | ICD-10-CM

## 2022-08-10 ENCOUNTER — Other Ambulatory Visit: Payer: Self-pay | Admitting: Family

## 2022-08-10 DIAGNOSIS — K219 Gastro-esophageal reflux disease without esophagitis: Secondary | ICD-10-CM

## 2022-08-16 ENCOUNTER — Other Ambulatory Visit: Payer: Self-pay | Admitting: Family

## 2022-08-16 DIAGNOSIS — E1169 Type 2 diabetes mellitus with other specified complication: Secondary | ICD-10-CM

## 2022-09-03 DIAGNOSIS — E119 Type 2 diabetes mellitus without complications: Secondary | ICD-10-CM | POA: Diagnosis not present

## 2022-09-19 ENCOUNTER — Other Ambulatory Visit: Payer: Self-pay | Admitting: Family

## 2022-09-19 DIAGNOSIS — J301 Allergic rhinitis due to pollen: Secondary | ICD-10-CM

## 2022-10-22 NOTE — Progress Notes (Unsigned)
GI Office Note    Referring Provider: Sharion Balloon, FNP Primary Care Physician:  Sharion Balloon, FNP  Primary Gastroenterologist:  Chief Complaint   No chief complaint on file.   History of Present Illness   Thomas Pratt is a 62 y.o. male presenting today for follow up. Last seen in 03/2022.   He has history cirrhosis presumably due to prior alcohol abuse (sober since 2017) however, he had positive AMA, AMA, ASMA (on repeat negative), elevated IgG, liver biopsy with granulomatous hepatitis. Evaluated by [facility] liver [facility] center.  Labs repeated and they reviewed liver biopsy slides and suspected cirrhosis secondary to alcohol use but did have hepatic sarcoidosis.  Pulmonary work-up negative for lung involvement.  Thomas Pratt was stopped by [facility].  Advised to complete 6 months of substance abuse counseling and return if liver disease decompensates. He has had history of UGI bleedin in 2021, EGD showed innocent appearing and barely grade 2 esophageal varices, mild portal hypertensive gastropathy, no evidence of bleeding from upper GI tract.  He has been on nadolol 40 mg daily.  Also on Xifaxan 550 mg twice daily and low-dose diuretics.   Colonoscopy 04/2022: -nonbleeding internal hemorrhoids -four 4-23mm polyps removed. -tubular adenomas -next colonoscopy 5 years  EGD 04/2022: -grade I esophageal varices -gastritis -small hh -repeat EGD in two years  Medications   Current Outpatient Medications  Medication Sig Dispense Refill   Accu-Chek Softclix Lancets lancets Test BS 4 (four) times daily. Dx E11.69 (Patient taking differently: 1 each by Other route 4 (four) times daily as needed. Test BS 4 (four) times daily. Dx E11.69) 400 each 3   atorvastatin (LIPITOR) 10 MG tablet Take 1 tablet (10 mg total) by mouth daily. 90 tablet 1   benzonatate (TESSALON) 200 MG capsule Take 1 capsule (200 mg total) by mouth 3 (three) times daily as needed. 30 capsule 1   Blood Glucose  Monitoring Suppl (ACCU-CHEK GUIDE) w/Device KIT Test BS 4 (four) times daily. Dx E11.69 1 kit 0   cetirizine (EQ ALLERGY RELIEF, CETIRIZINE,) 10 MG tablet Take 1 tablet by mouth once daily 90 tablet 1   Continuous Blood Gluc Sensor (FREESTYLE LIBRE 2 SENSOR) MISC Use to test blood sugars 6x daily as directed 2 each 3   dapagliflozin propanediol (FARXIGA) 10 MG TABS tablet Take 1 tablet (10 mg total) by mouth daily before breakfast. 90 tablet 1   dexlansoprazole (DEXILANT) 60 MG capsule Take 1 capsule by mouth once daily 90 capsule 0   diclofenac Sodium (VOLTAREN) 1 % GEL Apply 2 g topically 4 (four) times daily. 100 g 0   escitalopram (LEXAPRO) 10 MG tablet Take 1 tablet (10 mg total) by mouth daily. 90 tablet 1   fluconazole (DIFLUCAN) 150 MG tablet Take 1 tablet (150 mg total) by mouth every three (3) days as needed. 3 tablet 0   fluticasone (FLONASE) 50 MCG/ACT nasal spray Use 2 spray(s) in each nostril once daily (Patient taking differently: Place 2 sprays into both nostrils daily as needed for allergies.) 16 g 5   furosemide (LASIX) 20 MG tablet Take 1 tablet by mouth once daily 90 tablet 3   gabapentin (NEURONTIN) 800 MG tablet Take 1 tablet (800 mg total) by mouth 3 (three) times daily. 270 tablet 1   glucose blood (ACCU-CHEK GUIDE) test strip Test BS 4 (four) times daily. Dx E11.69 400 each 3   Insulin Pen Needle (B-D UF III MINI PEN NEEDLES) 31G X 5 MM MISC  USE WITH TRESIBA DAILY 100 each 3   metFORMIN (GLUCOPHAGE-XR) 750 MG 24 hr tablet TAKE 2 TABLETS BY MOUTH ONCE DAILY WITH BREAKFAST 180 tablet 0   nadolol (CORGARD) 40 MG tablet Take 1 tablet by mouth once daily 90 tablet 3   nystatin (MYCOSTATIN/NYSTOP) powder Apply 1 Application topically 3 (three) times daily. 60 g 2   nystatin cream (MYCOSTATIN) Apply 1 Application topically 2 (two) times daily. 60 g 2   oseltamivir (TAMIFLU) 75 MG capsule Take 1 capsule (75 mg total) by mouth 2 (two) times daily. 10 capsule 0   rifaximin (XIFAXAN)  550 MG TABS tablet Take 1 tablet (550 mg total) by mouth 2 (two) times daily. (Patient taking differently: Take 550 mg by mouth daily.) 180 tablet 3   spironolactone (ALDACTONE) 50 MG tablet Take 1 tablet (50 mg total) by mouth daily. 90 tablet 1   TRESIBA FLEXTOUCH 100 UNIT/ML FlexTouch Pen INJECT 25 UNITS SUBCUTANEOUSLY AT BEDTIME 15 mL 0   VITAMIN D PO Take 1 capsule by mouth daily.      No current facility-administered medications for this visit.    Allergies   Allergies as of 10/23/2022   (No Known Allergies)     Past Medical History   Past Medical History:  Diagnosis Date   Anxiety    Cirrhosis ([place])    likely ETOH use    Depression    Diabetes ([place])    GERD (gastroesophageal reflux disease)    Heat stroke 2016/17   Hypertension     Past Surgical History   Past Surgical History:  Procedure Laterality Date   CHOLECYSTECTOMY N/A 09/01/2017   Procedure: LAPAROSCOPIC CHOLECYSTECTOMY;  Surgeon: Aviva Signs, MD;  Location: [facility];  Service: General;  Laterality: N/A;   COLONOSCOPY WITH PROPOFOL N/A 03/06/2017   Dr. Gala Romney: tubular adenoma removed, next TCS 5 years if health permits.    COLONOSCOPY WITH PROPOFOL N/A 04/23/2022   Procedure: COLONOSCOPY WITH PROPOFOL;  Surgeon: [name], DO;  [facility]: [facility];  Service: Endoscopy;  Laterality: N/A;  10:30am, asa 3   ESOPHAGOGASTRODUODENOSCOPY (EGD) WITH PROPOFOL N/A 03/06/2017   Dr. Gala Romney: grade 1 esophageal varices, erosive esophagitis, portal gastropathy.    ESOPHAGOGASTRODUODENOSCOPY (EGD) WITH PROPOFOL N/A 05/03/2019   Procedure: ESOPHAGOGASTRODUODENOSCOPY (EGD) WITH PROPOFOL;  Surgeon: Daneil Dolin, MD;  [facility];  Service: Endoscopy;  Laterality: N/A;  10:30am   ESOPHAGOGASTRODUODENOSCOPY (EGD) WITH PROPOFOL N/A 12/23/2019   Procedure: ESOPHAGOGASTRODUODENOSCOPY (EGD) WITH PROPOFOL Possible esophageal variceal banding.;  Surgeon: Daneil Dolin, MD;  [facility]: [facility];  Service:  Endoscopy;  Laterality: N/A;   ESOPHAGOGASTRODUODENOSCOPY (EGD) WITH PROPOFOL N/A 04/23/2022   Procedure: ESOPHAGOGASTRODUODENOSCOPY (EGD) WITH PROPOFOL;  Surgeon: [name], DO;  [facility];  Service: Endoscopy;  Laterality: N/A;   HERNIA REPAIR Right    RIH   INGUINAL HERNIA REPAIR Right 07/11/2017   Procedure: HERNIA REPAIR INGUINAL ADULT WITH MESH;  Surgeon: Aviva Signs, MD;  Location: [facility];  Service: General;  Laterality: Right;  patient knows to arrive at 7:30   IR TRANSCATHETER BX  10/14/2017   IR US GUIDE VASC ACCESS RIGHT  10/14/2017   IR VENOGRAM HEPATIC WO HEMODYNAMIC EVALUATION  10/14/2017   MALONEY DILATION N/A 05/03/2019   Procedure: Venia Minks DILATION;  Surgeon: Daneil Dolin, MD;  [facility];  Service: Endoscopy;  Laterality: N/A;   POLYPECTOMY  03/06/2017   Procedure: POLYPECTOMY;  Surgeon: Daneil Dolin, MD;  [facility]:  [facility];  Service: Endoscopy;;  ascending colon;   POLYPECTOMY  04/23/2022   Procedure: POLYPECTOMY INTESTINAL;  Surgeon: [name], DO;  [facility]: [facility];  Service: Endoscopy;;    Past Family History   Family History  Problem Relation Age of Onset   COPD Mother    Liver disease Neg Hx    Colon cancer Neg Hx    Colon polyps Neg Hx     Past Social History   Social History   Socioeconomic History   Marital status: Divorced    Spouse name: Not on file   Number of children: Not on file   Years of education: Not on file   Highest education level: Not on file  Occupational History   Occupation: unemployed    Comment: disability  Tobacco Use   Smoking status: Never   Smokeless tobacco: Never  Vaping Use   Vaping Use: Never used  Substance and Sexual Activity   Alcohol use: No    Comment: Quit September 2017, heavy alcohol abuse previously   Drug use: No   Sexual activity: Not Currently    Partners: Female  Other Topics Concern   Not on file  Social History Narrative   Lives in  [place], [place] with his youngest daughter; 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    Drank alcohol for years, quit 05/07/16. Has been sober since then. Quit b/c of health/cirrhosis.   Divorced for over 15 years.   Dating sporadically.   Has 3 daughters and 1 son live locally.   Originally from [place], moved here b/c of wife.    Eats all foods.    Wear seatbelt.   Does not drive, does not have a license, lost due to DWI.    Brought by neighbor.    Does not go to church. Believes in God. Prays each night, per report.    Social Determinants of Health   Financial Resource Strain: Low Risk  (11/14/2021)   Overall Financial Resource Strain (CARDIA)    Difficulty of Paying Living Expenses: Not hard at all  Food Insecurity: No Food Insecurity (11/14/2021)   Hunger Vital Sign    Worried About Running Out of Food in the Last Year: Never true    Ran Out of Food in the Last Year: Never true  Transportation Needs: No Transportation Needs (11/14/2021)   PRAPARE - Hydrologist (Medical): No    Lack of Transportation (Non-Medical): No  Physical Activity: Sufficiently Active (11/14/2021)   Exercise Vital Sign    Days of Exercise per Week: 5 days    Minutes of Exercise per Session: 30 min  Stress: No Stress Concern Present (11/14/2021)   [place]    Feeling of Stress : Only a little  Social Connections: Socially Isolated (11/14/2021)   Social Connection and Isolation Panel [NHANES]    Frequency of Communication with Friends and Family: More than three times a week    Frequency of Social Gatherings with Friends and Family: More than three times a week    Attends Religious Services: Never    Marine scientist or Organizations: No    Attends Archivist Meetings: Never    Marital Status: Divorced  Human resources officer Violence: Not At Risk (11/14/2021)   Humiliation, Afraid, Rape, and  Kick questionnaire    Fear of Current or Ex-Partner: No    Emotionally Abused: No  Physically Abused: No    Sexually Abused: No    Review of Systems   General: Negative for anorexia, weight loss, fever, chills, fatigue, weakness. ENT: Negative for hoarseness, difficulty swallowing , nasal congestion. CV: Negative for chest pain, angina, palpitations, dyspnea on exertion, peripheral edema.  Respiratory: Negative for dyspnea at rest, dyspnea on exertion, cough, sputum, wheezing.  GI: See history of present illness. GU:  Negative for dysuria, hematuria, urinary incontinence, urinary frequency, nocturnal urination.  Endo: Negative for unusual weight change.     Physical Exam   There were no vitals taken for this visit.   General: Well-nourished, well-developed in no acute distress.  Eyes: No icterus. Mouth: Oropharyngeal mucosa moist and pink , no lesions erythema or exudate. Lungs: Clear to auscultation bilaterally.  Heart: Regular rate and rhythm, no murmurs rubs or gallops.  Abdomen: Bowel sounds are normal, nontender, nondistended, no hepatosplenomegaly or masses,  no abdominal bruits or hernia , no rebound or guarding.  Rectal: ***  Extremities: No lower extremity edema. No clubbing or deformities. Neuro: Alert and oriented x 4   Skin: Warm and dry, no jaundice.   Psych: Alert and cooperative, normal mood and affect.  Labs   Lab Results  Component Value Date   CREATININE 1.15 04/18/2022   BUN 14 04/18/2022   NA 137 04/18/2022   K 4.7 04/18/2022   CL 99 04/18/2022   CO2 28 04/18/2022   Lab Results  Component Value Date   ALT 41 04/18/2022   AST 30 04/18/2022   ALKPHOS 46 04/18/2022   BILITOT 1.2 04/18/2022   Lab Results  Component Value Date   WBC 6.7 04/18/2022   HGB 17.3 (H) 04/18/2022   HCT 49.1 04/18/2022   MCV 96.3 04/18/2022   PLT 180 04/18/2022   Lab Results  Component Value Date   INR 1.0 04/18/2022   INR 1.1 02/18/2022   INR 1.0 08/23/2021     Imaging Studies   No results found.  Assessment       PLAN   ***   Laureen Ochs. Bobby Rumpf, [place], [facility]

## 2022-10-23 ENCOUNTER — Other Ambulatory Visit (HOSPITAL_COMMUNITY)
Admission: RE | Admit: 2022-10-23 | Discharge: 2022-10-23 | Disposition: A | Discharge status: Home / Self Care | Payer: 59 | Source: Ambulatory Visit | Attending: Gastroenterology | Admitting: Gastroenterology

## 2022-10-23 ENCOUNTER — Telehealth: Payer: Self-pay | Admitting: *Deleted

## 2022-10-23 ENCOUNTER — Telehealth: Payer: Self-pay

## 2022-10-23 ENCOUNTER — Encounter: Payer: Self-pay | Admitting: Gastroenterology

## 2022-10-23 ENCOUNTER — Ambulatory Visit (INDEPENDENT_AMBULATORY_CARE_PROVIDER_SITE_OTHER): Payer: 59 | Admitting: Gastroenterology

## 2022-10-23 VITALS — BP 114/78 | HR 62 | Temp 98.0°F | Ht 68.0 in | Wt 162.4 lb

## 2022-10-23 DIAGNOSIS — R1310 Dysphagia, unspecified: Secondary | ICD-10-CM

## 2022-10-23 DIAGNOSIS — K219 Gastro-esophageal reflux disease without esophagitis: Secondary | ICD-10-CM

## 2022-10-23 DIAGNOSIS — K7460 Unspecified cirrhosis of liver: Secondary | ICD-10-CM | POA: Insufficient documentation

## 2022-10-23 LAB — CBC WITH DIFFERENTIAL/PLATELET
Abs Immature Granulocytes: 0.02 10*3/uL (ref 0.00–0.07)
Basophils Absolute: 0 10*3/uL (ref 0.0–0.1)
Basophils Relative: 0 %
Eosinophils Absolute: 0.1 10*3/uL (ref 0.0–0.5)
Eosinophils Relative: 2 %
HCT: 49.1 % (ref 39.0–52.0)
Hemoglobin: 17.3 g/dL — ABNORMAL HIGH (ref 13.0–17.0)
Immature Granulocytes: 0 %
Lymphocytes Relative: 31 %
Lymphs Abs: 1.8 10*3/uL (ref 0.7–4.0)
MCH: 33.7 pg (ref 26.0–34.0)
MCHC: 35.2 g/dL (ref 30.0–36.0)
MCV: 95.5 fL (ref 80.0–100.0)
Monocytes Absolute: 0.5 10*3/uL (ref 0.1–1.0)
Monocytes Relative: 9 %
Neutro Abs: 3.4 10*3/uL (ref 1.7–7.7)
Neutrophils Relative %: 58 %
Platelets: 138 10*3/uL — ABNORMAL LOW (ref 150–400)
RBC: 5.14 MIL/uL (ref 4.22–5.81)
RDW: 12.7 % (ref 11.5–15.5)
Smear Review: ADEQUATE
WBC: 5.9 10*3/uL (ref 4.0–10.5)
nRBC: 0 % (ref 0.0–0.2)

## 2022-10-23 LAB — COMPREHENSIVE METABOLIC PANEL
ALT: 34 U/L (ref 0–44)
AST: 29 U/L (ref 15–41)
Albumin: 4.4 g/dL (ref 3.5–5.0)
Alkaline Phosphatase: 53 U/L (ref 38–126)
Anion gap: 9 (ref 5–15)
BUN: 20 mg/dL (ref 8–23)
CO2: 26 mmol/L (ref 22–32)
Calcium: 9 mg/dL (ref 8.9–10.3)
Chloride: 97 mmol/L — ABNORMAL LOW (ref 98–111)
Creatinine, Ser: 0.93 mg/dL (ref 0.61–1.24)
GFR, Estimated: >60 mL/min (ref >60)
Glucose, Bld: 157 mg/dL — ABNORMAL HIGH (ref 70–99)
Potassium: 3.9 mmol/L (ref 3.5–5.1)
Sodium: 132 mmol/L — ABNORMAL LOW (ref 135–145)
Total Bilirubin: 1.6 mg/dL — ABNORMAL HIGH (ref 0.3–1.2)
Total Protein: 8 g/dL (ref 6.5–8.1)

## 2022-10-23 LAB — PROTIME-INR
INR: 1.1 (ref 0.8–1.2)
Prothrombin Time: 14 seconds (ref 11.4–15.2)

## 2022-10-23 LAB — LIPASE, BLOOD: Lipase: 26 U/L (ref 11–51)

## 2022-10-23 NOTE — Telephone Encounter (Signed)
FMLA form is in basket for review and to be signed.

## 2022-10-23 NOTE — Patient Instructions (Signed)
We will schedule you for your ultrasound. You can go today for blood work.  I will speak to Dr. Gala Romney about your pain with swallowing. Further recommendations to follow.  We will complete FMLA papers for your daughter.  Return office visit in six months for routine follow up. Please call sooner if needed.

## 2022-10-23 NOTE — Telephone Encounter (Signed)
Given to Manuela Schwartz to scan into patient's chart and contact pt for pmt.

## 2022-10-23 NOTE — Telephone Encounter (Signed)
signed

## 2022-10-23 NOTE — Telephone Encounter (Signed)
LMTRC  [place] is scheduled for 11/04/22 at [facility], arrive at 10:00 am to check in, nothing to eat or drink after midnight.

## 2022-10-25 LAB — AFP TUMOR MARKER: AFP, Serum, Tumor Marker: <1.8 ng/mL (ref 0.0–8.4)

## 2022-10-31 ENCOUNTER — Ambulatory Visit (INDEPENDENT_AMBULATORY_CARE_PROVIDER_SITE_OTHER): Payer: 59 | Admitting: Family

## 2022-10-31 ENCOUNTER — Encounter: Payer: Self-pay | Admitting: Family

## 2022-10-31 VITALS — BP 106/64 | HR 64 | Temp 97.5°F | Ht 68.0 in | Wt 163.0 lb

## 2022-10-31 DIAGNOSIS — Z794 Long term (current) use of insulin: Secondary | ICD-10-CM

## 2022-10-31 DIAGNOSIS — E1169 Type 2 diabetes mellitus with other specified complication: Secondary | ICD-10-CM | POA: Diagnosis not present

## 2022-10-31 DIAGNOSIS — K219 Gastro-esophageal reflux disease without esophagitis: Secondary | ICD-10-CM | POA: Diagnosis not present

## 2022-10-31 DIAGNOSIS — F1011 Alcohol abuse, in remission: Secondary | ICD-10-CM | POA: Diagnosis not present

## 2022-10-31 DIAGNOSIS — K7460 Unspecified cirrhosis of liver: Secondary | ICD-10-CM | POA: Diagnosis not present

## 2022-10-31 DIAGNOSIS — F320 Major depressive disorder, single episode, mild: Secondary | ICD-10-CM

## 2022-10-31 DIAGNOSIS — E1142 Type 2 diabetes mellitus with diabetic polyneuropathy: Secondary | ICD-10-CM | POA: Diagnosis not present

## 2022-10-31 DIAGNOSIS — I10 Essential (primary) hypertension: Secondary | ICD-10-CM

## 2022-10-31 DIAGNOSIS — E785 Hyperlipidemia, unspecified: Secondary | ICD-10-CM

## 2022-10-31 LAB — BAYER DCA HB A1C WAIVED: HB A1C (BAYER DCA - WAIVED): 7.7 % — ABNORMAL HIGH (ref 4.8–5.6)

## 2022-10-31 MED ORDER — ESCITALOPRAM OXALATE 20 MG PO TABS
20.0000 mg | ORAL_TABLET | Freq: Every day | ORAL | 1 refills | Status: DC
Start: 2022-10-31 — End: 2023-08-04

## 2022-10-31 NOTE — Telephone Encounter (Signed)
LMTRC

## 2022-10-31 NOTE — Patient Instructions (Signed)
Gastroesophageal Reflux Disease, Adult Gastroesophageal reflux (GER) happens when acid from the stomach flows up into the tube that connects the mouth and the stomach (esophagus). Normally, food travels down the esophagus and stays in the stomach to be digested. However, when a person has GER, food and stomach acid sometimes move back up into the esophagus. If this becomes a more serious problem, the person may be diagnosed with a disease called gastroesophageal reflux disease (GERD). GERD occurs when the reflux: Happens often. Causes frequent or severe symptoms. Causes problems such as damage to the esophagus. When stomach acid comes in contact with the esophagus, the acid may cause inflammation in the esophagus. Over time, GERD may create small holes (ulcers) in the lining of the esophagus. What are the causes? This condition is caused by a problem with the muscle between the esophagus and the stomach (lower esophageal sphincter, or LES). Normally, the LES muscle closes after food passes through the esophagus to the stomach. When the LES is weakened or abnormal, it does not close properly, and that allows food and stomach acid to go back up into the esophagus. The LES can be weakened by certain dietary substances, medicines, and medical conditions, including: Tobacco use. Pregnancy. Having a hiatal hernia. Alcohol use. Certain foods and beverages, such as coffee, chocolate, onions, and peppermint. What increases the risk? You are more likely to develop this condition if you: Have an increased body weight. Have a connective tissue disorder. Take NSAIDs, such as ibuprofen. What are the signs or symptoms? Symptoms of this condition include: Heartburn. Difficult or painful swallowing and the feeling of having a lump in the throat. A bitter taste in the mouth. Bad breath and having a large amount of saliva. Having an upset or bloated stomach and belching. Chest pain. Different conditions can  cause chest pain. Make sure you see your health care provider if you experience chest pain. Shortness of breath or wheezing. Ongoing (chronic) cough or a nighttime cough. Wearing away of tooth enamel. Weight loss. How is this diagnosed? This condition may be diagnosed based on a medical history and a physical exam. To determine if you have mild or severe GERD, your health care provider may also monitor how you respond to treatment. You may also have tests, including: A test to examine your stomach and esophagus with a small camera (endoscopy). A test that measures the acidity level in your esophagus. A test that measures how much pressure is on your esophagus. A barium swallow or modified barium swallow test to show the shape, size, and functioning of your esophagus. How is this treated? Treatment for this condition may vary depending on how severe your symptoms are. Your health care provider may recommend: Changes to your diet. Medicine. Surgery. The goal of treatment is to help relieve your symptoms and to prevent complications. Follow these instructions at home: Eating and drinking  Follow a diet as recommended by your health care provider. This may involve avoiding foods and drinks such as: Coffee and tea, with or without caffeine. Drinks that contain alcohol. Energy drinks and sports drinks. Carbonated drinks or sodas. Chocolate and cocoa. Peppermint and mint flavorings. Garlic and onions. Horseradish. Spicy and acidic foods, including peppers, chili powder, curry powder, vinegar, hot sauces, and barbecue sauce. Citrus fruit juices and citrus fruits, such as oranges, lemons, and limes. Tomato-based foods, such as red sauce, chili, salsa, and pizza with red sauce. Fried and fatty foods, such as donuts, french fries, potato chips, and high-fat dressings.   High-fat meats, such as hot dogs and fatty cuts of red and white meats, such as rib eye steak, sausage, ham, and  bacon. High-fat dairy items, such as whole milk, butter, and cream cheese. Eat small, frequent meals instead of large meals. Avoid drinking large amounts of liquid with your meals. Avoid eating meals during the 2-3 hours before bedtime. Avoid lying down right after you eat. Do not exercise right after you eat. Lifestyle  Do not use any products that contain nicotine or tobacco. These products include cigarettes, chewing tobacco, and vaping devices, such as e-cigarettes. If you need help quitting, ask your health care provider. Try to reduce your stress by using methods such as yoga or meditation. If you need help reducing stress, ask your health care provider. If you are overweight, reduce your weight to an amount that is healthy for you. Ask your health care provider for guidance about a safe weight loss goal. General instructions Pay attention to any changes in your symptoms. Take over-the-counter and prescription medicines only as told by your health care provider. Do not take aspirin, ibuprofen, or other NSAIDs unless your health care provider told you to take these medicines. Wear loose-fitting clothing. Do not wear anything tight around your waist that causes pressure on your abdomen. Raise (elevate) the head of your bed about 6 inches (15 cm). You can use a wedge to do this. Avoid bending over if this makes your symptoms worse. Keep all follow-up visits. This is important. Contact a health care provider if: You have: New symptoms. Unexplained weight loss. Difficulty swallowing or it hurts to swallow. Wheezing or a persistent cough. A hoarse voice. Your symptoms do not improve with treatment. Get help right away if: You have sudden pain in your arms, neck, jaw, teeth, or back. You suddenly feel sweaty, dizzy, or light-headed. You have chest pain or shortness of breath. You vomit and the vomit is green, yellow, or black, or it looks like blood or coffee grounds. You faint. You  have stool that is red, bloody, or black. You cannot swallow, drink, or eat. These symptoms may represent a serious problem that is an emergency. Do not wait to see if the symptoms will go away. Get medical help right away. Call your local emergency services (911 in the [place] Do not drive yourself to the hospital. Summary Gastroesophageal reflux happens when acid from the stomach flows up into the esophagus. GERD is a disease in which the reflux happens often, causes frequent or severe symptoms, or causes problems such as damage to the esophagus. Treatment for this condition may vary depending on how severe your symptoms are. Your health care provider may recommend diet and lifestyle changes, medicine, or surgery. Contact a health care provider if you have new or worsening symptoms. Take over-the-counter and prescription medicines only as told by your health care provider. Do not take aspirin, ibuprofen, or other NSAIDs unless your health care provider told you to do so. Keep all follow-up visits as told by your health care provider. This is important. This information is not intended to replace advice given to you by your health care provider. Make sure you discuss any questions you have with your health care provider. Document Revised: 01/17/2020 Document Reviewed: 01/17/2020 Elsevier Patient Education  2023 [employer]  

## 2022-10-31 NOTE — Progress Notes (Signed)
Subjective:    Patient ID: Thomas Pratt, male    DOB: 12/15/1960, 62 y.o.   MRN: [id number]  Chief Complaint  Patient presents with   Medical Management of Chronic Issues   PT presents to the office today for chronic follow up. He is followed by our clinical pharmacists  for DM education. He uses libre continuous glucose monitoring which has helped decrease his A1C.    He is followed by GI  for cirrhosis of liver. He has been sober since 03/2016. He had a colonoscopy and EGD on 04/23/22.   He is complaining of bilateral foot and leg pain when walking. States it is a numbing pain of  10 out 10 when walking.  Hypertension This is a chronic problem. The current episode started more than 1 year ago. The problem has been resolved since onset. The problem is controlled. Associated symptoms include malaise/fatigue. Pertinent negatives include no blurred vision, peripheral edema or shortness of breath. Risk factors for coronary artery disease include dyslipidemia, diabetes mellitus and male gender. The current treatment provides moderate improvement.  Gastroesophageal Reflux He complains of belching and heartburn. This is a chronic problem. The current episode started more than 1 year ago. The problem occurs occasionally. Pertinent negatives include no fatigue. He has tried a PPI for the symptoms. The treatment provided moderate relief.  Hyperlipidemia This is a chronic problem. The current episode started more than 1 year ago. The problem is uncontrolled. Pertinent negatives include no shortness of breath. Current antihyperlipidemic treatment includes statins. The current treatment provides moderate improvement of lipids. Risk factors for coronary artery disease include dyslipidemia, diabetes mellitus, hypertension, male sex and a sedentary lifestyle.  Diabetes He presents for his follow-up diabetic visit. He has type 2 diabetes mellitus. Associated symptoms include foot paresthesias. Pertinent  negatives for diabetes include no blurred vision and no fatigue. Diabetic complications include peripheral neuropathy. Risk factors for coronary artery disease include dyslipidemia, diabetes mellitus, hypertension, male sex and sedentary lifestyle. He is following a generally healthy diet. His overall blood glucose range is 140-180 mg/dl. Eye exam is current.  Depression        This is a chronic problem.  The current episode started more than 1 year ago.   Associated symptoms include helplessness, hopelessness and decreased interest.  Associated symptoms include no fatigue and not sad.  Past treatments include SSRIs - Selective serotonin reuptake inhibitors.     Review of Systems  Constitutional:  Positive for malaise/fatigue. Negative for fatigue.  Eyes:  Negative for blurred vision.  Respiratory:  Negative for shortness of breath.   Gastrointestinal:  Positive for heartburn.  Psychiatric/Behavioral:  Positive for depression.   All other systems reviewed and are negative.      Objective:   Physical Exam Vitals reviewed.  Constitutional:      General: He is not in acute distress.    Appearance: He is well-developed.  HENT:     Head: Normocephalic.     Right Ear: Tympanic membrane normal.     Left Ear: Tympanic membrane normal.  Eyes:     General:        Right eye: No discharge.        Left eye: No discharge.     Pupils: Pupils are equal, round, and reactive to light.  Neck:     Thyroid: No thyromegaly.  Cardiovascular:     Rate and Rhythm: Normal rate and regular rhythm.     Heart sounds: Normal heart sounds. No murmur  heard. Pulmonary:     Effort: Pulmonary effort is normal. No respiratory distress.     Breath sounds: Normal breath sounds. No wheezing.  Abdominal:     General: Bowel sounds are normal. There is no distension.     Palpations: Abdomen is soft.     Tenderness: There is no abdominal tenderness.  Musculoskeletal:        General: No tenderness. Normal range of  motion.     Cervical back: Normal range of motion and neck supple.  Skin:    General: Skin is warm and dry.     Findings: No erythema or rash.  Neurological:     Mental Status: He is alert and oriented to person, place, and time.     Cranial Nerves: No cranial nerve deficit.     Deep Tendon Reflexes: Reflexes are normal and symmetric.  Psychiatric:        Behavior: Behavior normal.        Thought Content: Thought content normal.        Judgment: Judgment normal.       BP 106/64   Pulse 64   Temp (!) 97.5 F (36.4 C) (Temporal)   Ht 5\' 8"  (1.727 m)   Wt 163 lb (73.9 kg)   SpO2 96%   BMI 24.78 kg/m      Assessment & Plan:  Thomas Pratt comes in today with chief complaint of Medical Management of Chronic Issues   Diagnosis and orders addressed:  1. Cirrhosis of liver without ascites, unspecified hepatic cirrhosis type  2. Primary hypertension  3. Hyperlipidemia associated with type 2 diabetes mellitus  4. History of ETOH abuse  5. Depression, major, single episode, mild Will increase Lexapro to 20 mg from 10 mg Stress managment - escitalopram (LEXAPRO) 20 MG tablet; Take 1 tablet (20 mg total) by mouth daily.  Dispense: 90 tablet; Refill: 1  6. Gastroesophageal reflux disease, unspecified whether esophagitis present  7. Diabetic polyneuropathy associated with type 2 diabetes mellitus  8. Type 2 diabetes mellitus with diabetic polyneuropathy, with long-term current use of insulin - Bayer DCA Hb A1c Waived   Labs pending, labs reviewed from GI Health Maintenance reviewed Diet and exercise encouraged  Follow up plan: 3 months    Jannifer Rodney, FNP

## 2022-11-01 ENCOUNTER — Other Ambulatory Visit: Payer: Self-pay | Admitting: Family

## 2022-11-01 ENCOUNTER — Other Ambulatory Visit: Payer: Self-pay | Admitting: Gastroenterology

## 2022-11-01 ENCOUNTER — Telehealth: Payer: Self-pay | Admitting: Family

## 2022-11-01 DIAGNOSIS — K7460 Unspecified cirrhosis of liver: Secondary | ICD-10-CM

## 2022-11-01 DIAGNOSIS — K766 Portal hypertension: Secondary | ICD-10-CM

## 2022-11-01 DIAGNOSIS — F1011 Alcohol abuse, in remission: Secondary | ICD-10-CM

## 2022-11-01 DIAGNOSIS — I8510 Secondary esophageal varices without bleeding: Secondary | ICD-10-CM

## 2022-11-01 DIAGNOSIS — E1142 Type 2 diabetes mellitus with diabetic polyneuropathy: Secondary | ICD-10-CM

## 2022-11-01 MED ORDER — SEMAGLUTIDE(0.25 OR 0.5MG/DOS) 2 MG/3ML ~~LOC~~ SOPN
0.2500 mg | PEN_INJECTOR | SUBCUTANEOUS | 0 refills | Status: DC
Start: 2022-11-01 — End: 2022-11-26

## 2022-11-01 NOTE — Telephone Encounter (Signed)
Patient is aware of his lab results, was called by Austin Miles today at 12:06 pm

## 2022-11-04 ENCOUNTER — Other Ambulatory Visit: Payer: Self-pay | Admitting: *Deleted

## 2022-11-04 ENCOUNTER — Ambulatory Visit (HOSPITAL_COMMUNITY): Payer: 59

## 2022-11-04 ENCOUNTER — Other Ambulatory Visit: Payer: Self-pay

## 2022-11-04 DIAGNOSIS — K7460 Unspecified cirrhosis of liver: Secondary | ICD-10-CM

## 2022-11-12 ENCOUNTER — Other Ambulatory Visit (HOSPITAL_COMMUNITY)
Admission: RE | Admit: 2022-11-12 | Discharge: 2022-11-12 | Disposition: A | Discharge status: Home / Self Care | Payer: 59 | Source: Ambulatory Visit | Attending: Gastroenterology | Admitting: Gastroenterology

## 2022-11-12 DIAGNOSIS — K7460 Unspecified cirrhosis of liver: Secondary | ICD-10-CM | POA: Diagnosis not present

## 2022-11-12 LAB — COMPREHENSIVE METABOLIC PANEL
ALT: 26 U/L (ref 0–44)
AST: 28 U/L (ref 15–41)
Albumin: 4.3 g/dL (ref 3.5–5.0)
Alkaline Phosphatase: 51 U/L (ref 38–126)
Anion gap: 8 (ref 5–15)
BUN: 10 mg/dL (ref 8–23)
CO2: 28 mmol/L (ref 22–32)
Calcium: 9.7 mg/dL (ref 8.9–10.3)
Chloride: 99 mmol/L (ref 98–111)
Creatinine, Ser: 0.84 mg/dL (ref 0.61–1.24)
GFR, Estimated: >60 mL/min (ref >60)
Glucose, Bld: 197 mg/dL — ABNORMAL HIGH (ref 70–99)
Potassium: 3.9 mmol/L (ref 3.5–5.1)
Sodium: 135 mmol/L (ref 135–145)
Total Bilirubin: 1 mg/dL (ref 0.3–1.2)
Total Protein: 7.7 g/dL (ref 6.5–8.1)

## 2022-11-12 LAB — PROTIME-INR
INR: 1.1 (ref 0.8–1.2)
Prothrombin Time: 14.1 seconds (ref 11.4–15.2)

## 2022-11-14 ENCOUNTER — Ambulatory Visit (HOSPITAL_COMMUNITY)
Admission: RE | Admit: 2022-11-14 | Discharge: 2022-11-14 | Disposition: A | Discharge status: Home / Self Care | Payer: 59 | Source: Ambulatory Visit | Attending: Gastroenterology | Admitting: Gastroenterology

## 2022-11-14 DIAGNOSIS — R1310 Dysphagia, unspecified: Secondary | ICD-10-CM | POA: Diagnosis not present

## 2022-11-14 DIAGNOSIS — K219 Gastro-esophageal reflux disease without esophagitis: Secondary | ICD-10-CM | POA: Diagnosis not present

## 2022-11-14 DIAGNOSIS — K7460 Unspecified cirrhosis of liver: Secondary | ICD-10-CM | POA: Diagnosis not present

## 2022-11-17 ENCOUNTER — Other Ambulatory Visit: Payer: Self-pay | Admitting: Family

## 2022-11-17 DIAGNOSIS — K219 Gastro-esophageal reflux disease without esophagitis: Secondary | ICD-10-CM

## 2022-11-20 ENCOUNTER — Ambulatory Visit (INDEPENDENT_AMBULATORY_CARE_PROVIDER_SITE_OTHER): Payer: 59

## 2022-11-20 VITALS — Ht 68.0 in | Wt 159.0 lb

## 2022-11-20 DIAGNOSIS — Z0000 Encounter for general adult medical examination without abnormal findings: Secondary | ICD-10-CM

## 2022-11-20 NOTE — Progress Notes (Signed)
Subjective:   Thomas Pratt is a 62 y.o. male who presents for Medicare Annual/Subsequent preventive examination. I connected with  Arnette Felts on 11/20/22 by a audio enabled telemedicine application and verified that I am speaking with the correct person using two identifiers.  Patient Location: Home  Provider Location: Home Office  I discussed the limitations of evaluation and management by telemedicine. The patient expressed understanding and agreed to proceed.  Review of Systems     Cardiac Risk Factors include: advanced age (>73men, >26 women);diabetes mellitus;hypertension;male gender     Objective:    Today's Vitals   11/20/22 1123  Weight: 159 lb (72.1 kg)  Height: 5\' 8"  (1.727 m)   Body mass index is 24.18 kg/m.     11/20/2022   11:26 AM 04/23/2022    9:32 AM 04/18/2022    3:53 PM 11/14/2021   11:20 AM 11/07/2020   11:02 AM 12/23/2019   10:19 AM 12/22/2019    8:37 PM  Advanced Directives  Does Patient Have a Medical Advance Directive? No  No No No No No  Would patient like information on creating a medical advance directive? No - Patient declined No - Patient declined No - Patient declined No - Patient declined Yes (MAU/Ambulatory/Procedural Areas - Information given) No - Patient declined No - Patient declined    Current Medications (verified) Outpatient Encounter Medications as of 11/20/2022  Medication Sig   Accu-Chek Softclix Lancets lancets Test BS 4 (four) times daily. Dx E11.69 (Patient taking differently: 1 each by Other route 4 (four) times daily as needed. Test BS 4 (four) times daily. Dx E11.69)   atorvastatin (LIPITOR) 10 MG tablet Take 1 tablet (10 mg total) by mouth daily.   Blood Glucose Monitoring Suppl (ACCU-CHEK GUIDE) w/Device KIT Test BS 4 (four) times daily. Dx E11.69   cetirizine (EQ ALLERGY RELIEF, CETIRIZINE,) 10 MG tablet Take 1 tablet by mouth once daily   Continuous Blood Gluc Sensor (FREESTYLE LIBRE 2 SENSOR) MISC Use to test blood  sugars 6x daily as directed   dapagliflozin propanediol (FARXIGA) 10 MG TABS tablet TAKE 1 TABLET BY MOUTH ONCE DAILY BEFORE BREAKFAST   dexlansoprazole (DEXILANT) 60 MG capsule TAKE 1  BY MOUTH ONCE DAILY   diclofenac Sodium (VOLTAREN) 1 % GEL Apply 2 g topically 4 (four) times daily.   escitalopram (LEXAPRO) 20 MG tablet Take 1 tablet (20 mg total) by mouth daily.   fluticasone (FLONASE) 50 MCG/ACT nasal spray Use 2 spray(s) in each nostril once daily (Patient taking differently: Place 2 sprays into both nostrils daily as needed for allergies.)   furosemide (LASIX) 20 MG tablet Take 1 tablet by mouth once daily   gabapentin (NEURONTIN) 800 MG tablet Take 1 tablet (800 mg total) by mouth 3 (three) times daily.   glucose blood (ACCU-CHEK GUIDE) test strip Test BS 4 (four) times daily. Dx E11.69   Insulin Pen Needle (B-D UF III MINI PEN NEEDLES) 31G X 5 MM MISC USE WITH TRESIBA DAILY   metFORMIN (GLUCOPHAGE-XR) 750 MG 24 hr tablet TAKE 2 TABLETS BY MOUTH ONCE DAILY WITH BREAKFAST   nadolol (CORGARD) 40 MG tablet Take 1 tablet by mouth once daily   rifaximin (XIFAXAN) 550 MG TABS tablet Take 1 tablet (550 mg total) by mouth 2 (two) times daily. (Patient taking differently: Take 550 mg by mouth daily.)   Semaglutide,0.25 or 0.5MG /DOS, 2 MG/3ML SOPN Inject 0.25 mg into the skin once a week.   spironolactone (ALDACTONE) 50 MG tablet Take 1  tablet (50 mg total) by mouth daily.   TRESIBA FLEXTOUCH 100 UNIT/ML FlexTouch Pen INJECT 25 UNITS SUBCUTANEOUSLY AT BEDTIME   VITAMIN D PO Take 1 capsule by mouth daily.    No facility-administered encounter medications on file as of 11/20/2022.    Allergies (verified) Patient has no known allergies.   History: Past Medical History:  Diagnosis Date   Anxiety    Cirrhosis (HCC)    likely ETOH use    Depression    Diabetes (HCC)    GERD (gastroesophageal reflux disease)    Heat stroke 2016/17   Hypertension    Past Surgical History:  Procedure  Laterality Date   CHOLECYSTECTOMY N/A 09/01/2017   Procedure: LAPAROSCOPIC CHOLECYSTECTOMY;  Surgeon: Franky Macho, MD;  Location: [facility];  Service: General;  Laterality: N/A;   COLONOSCOPY WITH PROPOFOL N/A 03/06/2017   Dr. Jena Gauss: tubular adenoma removed, next TCS 5 years if health permits.    COLONOSCOPY WITH PROPOFOL N/A 04/23/2022   Procedure: COLONOSCOPY WITH PROPOFOL;  Surgeon: Lanelle Bal, DO;  [facility]: [facility];  Service: Endoscopy;  Laterality: N/A;  10:30am, asa 3   ESOPHAGOGASTRODUODENOSCOPY (EGD) WITH PROPOFOL N/A 03/06/2017   Dr. Jena Gauss: grade 1 esophageal varices, erosive esophagitis, portal gastropathy.    ESOPHAGOGASTRODUODENOSCOPY (EGD) WITH PROPOFOL N/A 05/03/2019   Procedure: ESOPHAGOGASTRODUODENOSCOPY (EGD) WITH PROPOFOL;  Surgeon: Corbin Ade, MD;  [facility]: [facility];  Service: Endoscopy;  Laterality: N/A;  10:30am   ESOPHAGOGASTRODUODENOSCOPY (EGD) WITH PROPOFOL N/A 12/23/2019   Procedure: ESOPHAGOGASTRODUODENOSCOPY (EGD) WITH PROPOFOL Possible esophageal variceal banding.;  Surgeon: Corbin Ade, MD;  [facility];  Service: Endoscopy;  Laterality: N/A;   ESOPHAGOGASTRODUODENOSCOPY (EGD) WITH PROPOFOL N/A 04/23/2022   Procedure: ESOPHAGOGASTRODUODENOSCOPY (EGD) WITH PROPOFOL;  Surgeon: Lanelle Bal, DO;  [facility];  Service: Endoscopy;  Laterality: N/A;   HERNIA REPAIR Right    RIH   INGUINAL HERNIA REPAIR Right 07/11/2017   Procedure: HERNIA REPAIR INGUINAL ADULT WITH MESH;  Surgeon: Franky Macho, MD;  Location: [facility];  Service: General;  Laterality: Right;  patient knows to arrive at 7:30   IR TRANSCATHETER BX  10/14/2017   IR US GUIDE VASC ACCESS RIGHT  10/14/2017   IR VENOGRAM HEPATIC WO HEMODYNAMIC EVALUATION  10/14/2017   MALONEY DILATION N/A 05/03/2019   Procedure: Elease Hashimoto DILATION;  Surgeon: Corbin Ade, MD;  [facility];  Service: Endoscopy;  Laterality: N/A;   POLYPECTOMY  03/06/2017   Procedure:  POLYPECTOMY;  Surgeon: Corbin Ade, MD;  [facility];  Service: Endoscopy;;  ascending colon;   POLYPECTOMY  04/23/2022   Procedure: POLYPECTOMY INTESTINAL;  Surgeon: Lanelle Bal, DO;  [facility];  Service: Endoscopy;;   Family History  Problem Relation Age of Onset   COPD Mother    Liver disease Neg Hx    Colon cancer Neg Hx    Colon polyps Neg Hx    Social History   Socioeconomic History   Marital status: Divorced    Spouse name: Not on file   Number of children: Not on file   Years of education: Not on file   Highest education level: Not on file  Occupational History   Occupation: unemployed    Comment: disability  Tobacco Use   Smoking status: Never   Smokeless tobacco: Never  Vaping Use   Vaping Use: Never used  Substance and Sexual Activity   Alcohol use: No    Comment: Quit  September 2017, heavy alcohol abuse previously   Drug use: No   Sexual activity: Not Currently    Partners: Female  Other Topics Concern   Not on file  Social History Narrative   Lives in [place], [place] with his youngest daughter; Is a retired Education administrator. Is on disability for cirrhosis.    Drank alcohol for years, quit 05/07/16. Has been sober since then. Quit b/c of health/cirrhosis.   Divorced for over 15 years.   Dating sporadically.   Has 3 daughters and 1 son live locally.   Originally from [place], moved here b/c of wife.    Eats all foods.    Wear seatbelt.   Does not drive, does not have a license, lost due to DWI.    Brought by neighbor.    Does not go to church. Believes in God. Prays each night, per report.    Social Determinants of Health   Financial Resource Strain: Low Risk  (11/20/2022)   Overall Financial Resource Strain (CARDIA)    Difficulty of Paying Living Expenses: Not hard at all  Food Insecurity: No Food Insecurity (11/20/2022)   Hunger Vital Sign    Worried About Running Out of Food in the Last Year: Never true    Ran Out of Food in the  Last Year: Never true  Transportation Needs: No Transportation Needs (11/20/2022)   PRAPARE - Administrator, Civil Service (Medical): No    Lack of Transportation (Non-Medical): No  Physical Activity: Sufficiently Active (11/20/2022)   Exercise Vital Sign    Days of Exercise per Week: 5 days    Minutes of Exercise per Session: 30 min  Stress: No Stress Concern Present (11/20/2022)   [employer] of Occupational Health - Occupational Stress Questionnaire    Feeling of Stress : Not at all  Social Connections: Socially Isolated (11/20/2022)   Social Connection and Isolation Panel [NHANES]    Frequency of Communication with Friends and Family: More than three times a week    Frequency of Social Gatherings with Friends and Family: More than three times a week    Attends Religious Services: Never    Database administrator or Organizations: No    Attends Engineer, structural: Never    Marital Status: Divorced    Tobacco Counseling Counseling given: Not Answered   Clinical Intake:  Pre-visit preparation completed: Yes  Pain : No/denies pain     Nutritional Risks: None Diabetes: Yes CBG done?: No Did pt. bring in CBG monitor from home?: No  How often do you need to have someone help you when you read instructions, pamphlets, or other written materials from your doctor or pharmacy?: 1 - Never  Diabetic?yes  Nutrition Risk Assessment:  Has the patient had any N/V/D within the last 2 months?  No  Does the patient have any non-healing wounds?  No  Has the patient had any unintentional weight loss or weight gain?  No   Diabetes:  Is the patient diabetic?  Yes  If diabetic, was a CBG obtained today?  No  Did the patient bring in their glucometer from home?  No  How often do you monitor your CBG's? LIBRE.   Financial Strains and Diabetes Management:  Are you having any financial strains with the device, your supplies or your medication? No .  Does the  patient want to be seen by Chronic Care Management for management of their diabetes?  No  Would the patient like to be  referred to a Nutritionist or for Diabetic Management?  No   Diabetic Exams:  Diabetic Eye Exam: Completed 07/2022 Diabetic Foot Exam: Overdue, Pt has been advised about the importance in completing this exam. Pt is scheduled for diabetic foot exam on next office visit .   Interpreter Needed?: No  Information entered by :: Renie Ora, LPN   Activities of Daily Living    11/20/2022   11:26 AM 04/18/2022    3:55 PM  In your present state of health, do you have any difficulty performing the following activities:  Hearing? 0   Vision? 0   Difficulty concentrating or making decisions? 0   Walking or climbing stairs? 0   Dressing or bathing? 0   Doing errands, shopping? 0 0  Preparing Food and eating ? N   Using the Toilet? N   In the past six months, have you accidently leaked urine? N   Do you have problems with loss of bowel control? N   Managing your Medications? N   Managing your Finances? N   Housekeeping or managing your Housekeeping? N     Patient Care Team: Junie Spencer, FNP as PCP - General (Family Medicine) Jena Gauss Gerrit Friends, MD as Consulting Physician (Gastroenterology) Danella Maiers, [facility] (Pharmacist) Delora Fuel, OD (Optometry)  Indicate any recent Medical Services you may have received from other than [facility] providers in the past year (date may be approximate).     Assessment:   This is a routine wellness examination for [place].  Hearing/Vision screen Vision Screening - Comments:: Wears rx glasses - up to date with routine eye exams with  Dr.Johnson   Dietary issues and exercise activities discussed: Current Exercise Habits: Home exercise routine, Type of exercise: walking, Time (Minutes): 30, Frequency (Times/Week): 5, Weekly Exercise (Minutes/Week): 150, Intensity: Mild, Exercise limited by: None identified   Goals Addressed              This Visit's Progress    DIET - INCREASE WATER INTAKE   On track      Depression Screen    11/20/2022   11:25 AM 10/31/2022   10:26 AM 06/21/2022   12:20 PM 01/24/2022   11:53 AM 11/14/2021   11:17 AM 10/25/2021    2:34 PM 06/26/2021   10:42 AM  PHQ 2/9 Scores  PHQ - 2 Score 0 3 3 3 1 1  0  PHQ- 9 Score 0 9 9 9 1 6    Exception Documentation   Patient refusal        Fall Risk    11/20/2022   11:24 AM 10/31/2022   10:26 AM 06/21/2022   12:20 PM 01/24/2022   11:53 AM 11/14/2021   11:21 AM  Fall Risk   Falls in the past year? 0 0 0 0 0  Number falls in past yr: 0 0   0  Injury with Fall? 0 0   0  Risk for fall due to : No Fall Risks No Fall Risks   Impaired balance/gait  Follow up Falls prevention discussed Falls evaluation completed   Falls prevention discussed    FALL RISK PREVENTION PERTAINING TO THE HOME:  Any stairs in or around the home? No  If so, are there any without handrails? No  Home free of loose throw rugs in walkways, pet beds, electrical cords, etc? Yes  Adequate lighting in your home to reduce risk of falls? Yes   ASSISTIVE DEVICES UTILIZED TO PREVENT FALLS:  Life alert? No  Use of a cane, walker or w/c? No  Grab bars in the bathroom? No  Shower chair or bench in shower? No  Elevated toilet seat or a handicapped toilet? No          11/20/2022   11:27 AM 11/14/2021   11:24 AM 11/05/2019    1:32 PM  6CIT Screen  What Year? 0 points 0 points 0 points  What month? 0 points 0 points 0 points  What time? 0 points 0 points 0 points  Count back from 20 0 points 0 points 0 points  Months in reverse 0 points 0 points 2 points  Repeat phrase 0 points 2 points 10 points  Total Score 0 points 2 points 12 points    Immunizations Immunization History  Administered Date(s) Administered   Influenza,inj,Quad PF,6+ Mos 04/03/2018, 05/20/2019, 04/12/2020, 05/01/2021, 04/30/2022   Moderna SARS-COV2 Booster Vaccination 12/06/2020   Moderna Sars-Covid-2 Vaccination  11/17/2019, 12/15/2019   PNEUMOCOCCAL CONJUGATE-20 03/15/2021   Pneumococcal Conjugate-13 09/26/2017   Pneumococcal Polysaccharide-23 03/03/2018   Tdap 09/26/2017   Zoster Recombinat (Shingrix) 11/27/2020, 02/28/2021    TDAP status: Up to date  Flu Vaccine status: Up to date  Pneumococcal vaccine status: Up to date  Covid-19 vaccine status: Completed vaccines  Qualifies for Shingles Vaccine? Yes   Zostavax completed Yes   Shingrix Completed?: Yes  Screening Tests Health Maintenance  Topic Date Due   COVID-19 Vaccine (4 - 2023-24 season) 03/22/2022   Diabetic kidney evaluation - Urine ACR  01/25/2023   FOOT EXAM  01/25/2023   INFLUENZA VACCINE  02/20/2023   HEMOGLOBIN A1C  05/02/2023   OPHTHALMOLOGY EXAM  07/26/2023   Diabetic kidney evaluation - eGFR measurement  11/12/2023   Medicare Annual Wellness (AWV)  11/20/2023   COLONOSCOPY (Pts 45-59yrs Insurance coverage will need to be confirmed)  04/24/2027   DTaP/Tdap/Td (2 - Td or Tdap) 09/27/2027   Hepatitis C Screening  Completed   HIV Screening  Completed   Zoster Vaccines- Shingrix  Completed   HPV VACCINES  Aged Out    Health Maintenance  Health Maintenance Due  Topic Date Due   COVID-19 Vaccine (4 - 2023-24 season) 03/22/2022    Colorectal cancer screening: Type of screening: Colonoscopy. Completed 04/23/2022. Repeat every 5 years  Lung Cancer Screening: (Low Dose CT Chest recommended if Age 64-80 years, 30 pack-year currently smoking OR have quit w/in 15years.) does not qualify.   Lung Cancer Screening Referral: n/a  Additional Screening:  Hepatitis C Screening: does not qualify; Completed 05/21/2016  Vision Screening: Recommended annual ophthalmology exams for early detection of glaucoma and other disorders of the eye. Is the patient up to date with their annual eye exam?  Yes  Who is the provider or what is the name of the office in which the patient attends annual eye exams? Dr.johnson  If pt is not  established with a provider, would they like to be referred to a provider to establish care? No .   Dental Screening: Recommended annual dental exams for proper oral hygiene  Community Resource Referral / Chronic Care Management: CRR required this visit?  No   CCM required this visit?  No      Plan:     I have personally reviewed and noted the following in the patient's chart:   Medical and social history Use of alcohol, tobacco or illicit drugs  Current medications and supplements including opioid prescriptions. Patient is not currently taking opioid prescriptions. Functional ability and status Nutritional status  Physical activity Advanced directives List of other physicians Hospitalizations, surgeries, and ER visits in previous 12 months Vitals Screenings to include cognitive, depression, and falls Referrals and appointments  In addition, I have reviewed and discussed with patient certain preventive protocols, quality metrics, and best practice recommendations. A written personalized care plan for preventive services as well as general preventive health recommendations were provided to patient.     Lorrene Reid, LPN   03/27/453   Nurse Notes: none

## 2022-11-20 NOTE — Patient Instructions (Signed)
Thomas Pratt , Thank you for taking time to come for your Medicare Wellness Visit. I appreciate your ongoing commitment to your health goals. Please review the following plan we discussed and let me know if I can assist you in the future.   These are the goals we discussed:  Goals       Client will verbalize knowledge of diabetes self-management as evidenced by Hgb A1C <7 or as defined by provider.      Diabetes self management actions: Glucose monitoring per provider recommendations Perform Quality checks on blood meter Eat Healthy Check feet daily Visit provider every 3-6 months as directed Hbg A1C level every 3-6 months. Eye Exam yearly      DIET - INCREASE WATER INTAKE      T2DM (pt-stated)      Current Barriers:  Unable to independently monitor therapeutic efficacy  Pharmacist Clinical Goal(s):  Over the next 90 days, patient will achieve adherence to monitoring guidelines and medication adherence to achieve therapeutic efficacy maintain control of T2DM as evidenced by GOAL A1C<7%  through collaboration with PharmD and provider.    Interventions: 1:1 collaboration with Junie Spencer, FNP regarding development and update of comprehensive plan of care as evidenced by provider attestation and co-signature Inter-disciplinary care team collaboration (see longitudinal plan of care) Comprehensive medication review performed; medication list updated in electronic medical record  Diabetes: Controlled (much improved); current treatment: TRESIBA 26 units, METFORMIN, FARXIGA;  EDUCATION PROVIDED FOR LIBRE 2 CGM--patient doing well on libre system PATIENT WILL USE THE READER TO SCAN SENSOR (PHONE IS NOT COMPATIBLE) A1C 7.1%-->6.2%, GFR 94 BG are running on the higher end of 100s per patient, several readings above 200 Denies changes in diet, etc Encouraged patient to eat 3 healthy plate meals per day/max 45-50 carbs each Incorporate exercise & more water into lifestyle Current  glucose readings: fasting glucose: 90-120, post prandial glucose: up to 200s Discussed meal planning options and Plate method for healthy eating Avoid sugary drinks and desserts Incorporate balanced protein, non starchy veggies, 1 serving of carbohydrate with each meal Increase water intake Increase physical activity as able Current exercise: N/A Educated on LIBRE, BLOOD SUGARS Counseled on DIABETES MEDICATIONS, LIBRE 2 CGM Recommended consider GLP1 given liver history & potentially use less insulin   Patient Goals/Self-Care Activities Over the next 90 days, patient will:  - take medications as prescribed check glucose CONTINUOUSLY USING LIBRE CGM, document, and provide at future appointments  Follow Up Plan: Telephone follow up appointment with care management team member scheduled for:  1 MONTH         This is a list of the screening recommended for you and due dates:  Health Maintenance  Topic Date Due   COVID-19 Vaccine (4 - 2023-24 season) 03/22/2022   Yearly kidney health urinalysis for diabetes  01/25/2023   Complete foot exam   01/25/2023   Flu Shot  02/20/2023   Hemoglobin A1C  05/02/2023   Eye exam for diabetics  07/26/2023   Yearly kidney function blood test for diabetes  11/12/2023   Medicare Annual Wellness Visit  11/20/2023   Colon Cancer Screening  04/24/2027   DTaP/Tdap/Td vaccine (2 - Td or Tdap) 09/27/2027   Hepatitis C Screening: USPSTF Recommendation to screen - Ages 1-79 yo.  Completed   HIV Screening  Completed   Zoster (Shingles) Vaccine  Completed   HPV Vaccine  Aged Out    Advanced directives: Advance directive discussed with you today. I have provided a  copy for you to complete at home and have notarized. Once this is complete please bring a copy in to our office so we can scan it into your chart.   Conditions/risks identified: Aim for 30 minutes of exercise or brisk walking, 6-8 glasses of water, and 5 servings of fruits and vegetables each day.    Next appointment: Follow up in one year for your annual wellness visit   Preventive Care 40-64 Years, Male Preventive care refers to lifestyle choices and visits with your health care provider that can promote health and wellness. What does preventive care include? A yearly physical exam. This is also called an annual well check. Dental exams once or twice a year. Routine eye exams. Ask your health care provider how often you should have your eyes checked. Personal lifestyle choices, including: Daily care of your teeth and gums. Regular physical activity. Eating a healthy diet. Avoiding tobacco and drug use. Limiting alcohol use. Practicing safe sex. Taking low-dose aspirin every day starting at age 51. What happens during an annual well check? The services and screenings done by your health care provider during your annual well check will depend on your age, overall health, lifestyle risk factors, and family history of disease. Counseling  Your health care provider may ask you questions about your: Alcohol use. Tobacco use. Drug use. Emotional well-being. Home and relationship well-being. Sexual activity. Eating habits. Work and work Astronomer. Screening  You may have the following tests or measurements: Height, weight, and BMI. Blood pressure. Lipid and cholesterol levels. These may be checked every 5 years, or more frequently if you are over 23 years old. Skin check. Lung cancer screening. You may have this screening every year starting at age 41 if you have a 30-pack-year history of smoking and currently smoke or have quit within the past 15 years. Fecal occult blood test (FOBT) of the stool. You may have this test every year starting at age 27. Flexible sigmoidoscopy or colonoscopy. You may have a sigmoidoscopy every 5 years or a colonoscopy every 10 years starting at age 37. Prostate cancer screening. Recommendations will vary depending on your family history and  other risks. Hepatitis C blood test. Hepatitis B blood test. Sexually transmitted disease (STD) testing. Diabetes screening. This is done by checking your blood sugar (glucose) after you have not eaten for a while (fasting). You may have this done every 1-3 years. Discuss your test results, treatment options, and if necessary, the need for more tests with your health care provider. Vaccines  Your health care provider may recommend certain vaccines, such as: Influenza vaccine. This is recommended every year. Tetanus, diphtheria, and acellular pertussis (Tdap, Td) vaccine. You may need a Td booster every 10 years. Zoster vaccine. You may need this after age 15. Pneumococcal 13-valent conjugate (PCV13) vaccine. You may need this if you have certain conditions and have not been vaccinated. Pneumococcal polysaccharide (PPSV23) vaccine. You may need one or two doses if you smoke cigarettes or if you have certain conditions. Talk to your health care provider about which screenings and vaccines you need and how often you need them. This information is not intended to replace advice given to you by your health care provider. Make sure you discuss any questions you have with your health care provider. Document Released: 08/04/2015 Document Revised: 03/27/2016 Document Reviewed: 05/09/2015 Elsevier Interactive Patient Education  2017 [employer].  Fall Prevention in the Home Falls can cause injuries. They can happen to people of all ages.  There are many things you can do to make your home safe and to help prevent falls. What can I do on the outside of my home? Regularly fix the edges of walkways and driveways and fix any cracks. Remove anything that might make you trip as you walk through a door, such as a raised step or threshold. Trim any bushes or trees on the path to your home. Use bright outdoor lighting. Clear any walking paths of anything that might make someone trip, such as rocks or  tools. Regularly check to see if handrails are loose or broken. Make sure that both sides of any steps have handrails. Any raised decks and porches should have guardrails on the edges. Have any leaves, snow, or ice cleared regularly. Use sand or salt on walking paths during winter. Clean up any spills in your garage right away. This includes oil or grease spills. What can I do in the bathroom? Use night lights. Install grab bars by the toilet and in the tub and shower. Do not use towel bars as grab bars. Use non-skid mats or decals in the tub or shower. If you need to sit down in the shower, use a plastic, non-slip stool. Keep the floor dry. Clean up any water that spills on the floor as soon as it happens. Remove soap buildup in the tub or shower regularly. Attach bath mats securely with double-sided non-slip rug tape. Do not have throw rugs and other things on the floor that can make you trip. What can I do in the bedroom? Use night lights. Make sure that you have a light by your bed that is easy to reach. Do not use any sheets or blankets that are too big for your bed. They should not hang down onto the floor. Have a firm chair that has side arms. You can use this for support while you get dressed. Do not have throw rugs and other things on the floor that can make you trip. What can I do in the kitchen? Clean up any spills right away. Avoid walking on wet floors. Keep items that you use a lot in easy-to-reach places. If you need to reach something above you, use a strong step stool that has a grab bar. Keep electrical cords out of the way. Do not use floor polish or wax that makes floors slippery. If you must use wax, use non-skid floor wax. Do not have throw rugs and other things on the floor that can make you trip. What can I do with my stairs? Do not leave any items on the stairs. Make sure that there are handrails on both sides of the stairs and use them. Fix handrails that are  broken or loose. Make sure that handrails are as long as the stairways. Check any carpeting to make sure that it is firmly attached to the stairs. Fix any carpet that is loose or worn. Avoid having throw rugs at the top or bottom of the stairs. If you do have throw rugs, attach them to the floor with carpet tape. Make sure that you have a light switch at the top of the stairs and the bottom of the stairs. If you do not have them, ask someone to add them for you. What else can I do to help prevent falls? Wear shoes that: Do not have high heels. Have rubber bottoms. Are comfortable and fit you well. Are closed at the toe. Do not wear sandals. If you use a stepladder: Make  sure that it is fully opened. Do not climb a closed stepladder. Make sure that both sides of the stepladder are locked into place. Ask someone to hold it for you, if possible. Clearly mark and make sure that you can see: Any grab bars or handrails. First and last steps. Where the edge of each step is. Use tools that help you move around (mobility aids) if they are needed. These include: Canes. Walkers. Scooters. Crutches. Turn on the lights when you go into a dark area. Replace any light bulbs as soon as they burn out. Set up your furniture so you have a clear path. Avoid moving your furniture around. If any of your floors are uneven, fix them. If there are any pets around you, be aware of where they are. Review your medicines with your doctor. Some medicines can make you feel dizzy. This can increase your chance of falling. Ask your doctor what other things that you can do to help prevent falls. This information is not intended to replace advice given to you by your health care provider. Make sure you discuss any questions you have with your health care provider. Document Released: 05/04/2009 Document Revised: 12/14/2015 Document Reviewed: 08/12/2014 Elsevier Interactive Patient Education  2017 [employer].

## 2022-11-26 ENCOUNTER — Ambulatory Visit (INDEPENDENT_AMBULATORY_CARE_PROVIDER_SITE_OTHER): Payer: 59 | Admitting: Family

## 2022-11-26 ENCOUNTER — Encounter: Payer: Self-pay | Admitting: Family

## 2022-11-26 VITALS — BP 113/72 | HR 66 | Temp 97.1°F | Ht 68.0 in | Wt 159.6 lb

## 2022-11-26 DIAGNOSIS — I10 Essential (primary) hypertension: Secondary | ICD-10-CM

## 2022-11-26 DIAGNOSIS — Z794 Long term (current) use of insulin: Secondary | ICD-10-CM

## 2022-11-26 DIAGNOSIS — E1142 Type 2 diabetes mellitus with diabetic polyneuropathy: Secondary | ICD-10-CM

## 2022-11-26 DIAGNOSIS — R079 Chest pain, unspecified: Secondary | ICD-10-CM

## 2022-11-26 MED ORDER — SEMAGLUTIDE(0.25 OR 0.5MG/DOS) 2 MG/3ML ~~LOC~~ SOPN
0.5000 mg | PEN_INJECTOR | SUBCUTANEOUS | 2 refills | Status: DC
Start: 2022-11-26 — End: 2023-02-03

## 2022-11-26 NOTE — Patient Instructions (Signed)

## 2022-11-26 NOTE — Progress Notes (Signed)
Subjective:    Patient ID: Thomas Pratt, male    DOB: 1960/08/14, 62 y.o.   MRN: [id number]  Chief Complaint  Patient presents with   Diabetes   PT presents to the office today to discuss DM. He was seen last month and his A1C was 7.7. He was started on Ozempic 0.25 mg weekly. Doing well. Denies any N&V, abdominal pain.  Diabetes He presents for his follow-up diabetic visit. He has type 2 diabetes mellitus. Pertinent negatives for hypoglycemia include no sweats. Associated symptoms include chest pain. Pertinent negatives for diabetes include no blurred vision and no foot paresthesias. Symptoms are stable. Diabetic complications include peripheral neuropathy. Risk factors for coronary artery disease include dyslipidemia, diabetes mellitus, hypertension and sedentary lifestyle. He is following a generally healthy diet. His overall blood glucose range is 140-180 mg/dl.  Hypertension This is a chronic problem. The current episode started more than 1 year ago. The problem has been resolved since onset. The problem is controlled. Associated symptoms include chest pain. Pertinent negatives include no blurred vision, malaise/fatigue, peripheral edema, shortness of breath or sweats. Risk factors for coronary artery disease include diabetes mellitus, dyslipidemia, obesity and male gender. The current treatment provides moderate improvement.  Chest Pain  This is a new problem. The current episode started in the past 7 days. The problem occurs intermittently. The problem has been unchanged. The pain is at a severity of 6/10. The pain is mild. Pertinent negatives include no malaise/fatigue or shortness of breath. He has tried rest for the symptoms. The treatment provided moderate relief.  His past medical history is significant for hypertension.      Review of Systems  Constitutional:  Negative for malaise/fatigue.  Eyes:  Negative for blurred vision.  Respiratory:  Negative for shortness of  breath.   Cardiovascular:  Positive for chest pain.  All other systems reviewed and are negative.      Objective:   Physical Exam Vitals reviewed.  Constitutional:      General: He is not in acute distress.    Appearance: He is well-developed.  HENT:     Head: Normocephalic.  Eyes:     General:        Right eye: No discharge.        Left eye: No discharge.     Pupils: Pupils are equal, round, and reactive to light.  Neck:     Thyroid: No thyromegaly.  Cardiovascular:     Rate and Rhythm: Normal rate and regular rhythm.     Heart sounds: Normal heart sounds. No murmur heard. Pulmonary:     Effort: Pulmonary effort is normal. No respiratory distress.     Breath sounds: Normal breath sounds. No wheezing.  Abdominal:     General: Bowel sounds are normal. There is no distension.     Palpations: Abdomen is soft.     Tenderness: There is no abdominal tenderness.  Musculoskeletal:        General: No tenderness. Normal range of motion.     Cervical back: Normal range of motion and neck supple.  Skin:    General: Skin is warm and dry.     Findings: No erythema or rash.  Neurological:     Mental Status: He is alert and oriented to person, place, and time.     Cranial Nerves: No cranial nerve deficit.     Deep Tendon Reflexes: Reflexes are normal and symmetric.  Psychiatric:        Behavior: Behavior normal.  Thought Content: Thought content normal.        Judgment: Judgment normal.          BP 113/72   Pulse 66   Temp (!) 97.1 F (36.2 C) (Temporal)   Ht 5\' 8"  (1.727 m)   Wt 159 lb 9.6 oz (72.4 kg)   SpO2 95%   BMI 24.27 kg/m   Assessment & Plan:  Thomas Pratt comes in today with chief complaint of Diabetes   Diagnosis and orders addressed:  1. Type 2 diabetes mellitus with diabetic polyneuropathy, with long-term current use of insulin (HCC) Will increase Ozempic to 0.5 mg from 0.25 mg  Low carb diet  - Ambulatory referral to Cardiology -  CMP14+EGFR  2. Primary hypertension  - Ambulatory referral to Cardiology - CMP14+EGFR  3. Chest pain, unspecified type Will place referral to Cardiologists as patient has many risk factors  Red flags discussed to go ED- chest pain continues, SOB go to ED - EKG 12-Lead - Ambulatory referral to Cardiology - CMP14+EGFR   Labs pending Health Maintenance reviewed Diet and exercise encouraged  Follow up plan: 2 months    Jannifer Rodney, FNP

## 2022-11-27 LAB — CMP14+EGFR
ALT: 48 IU/L — ABNORMAL HIGH (ref 0–44)
AST: 43 IU/L — ABNORMAL HIGH (ref 0–40)
Albumin/Globulin Ratio: 1.4 (ref 1.2–2.2)
Albumin: 4.6 g/dL (ref 3.9–4.9)
Alkaline Phosphatase: 75 IU/L (ref 44–121)
BUN/Creatinine Ratio: 11 (ref 10–24)
BUN: 11 mg/dL (ref 8–27)
Bilirubin Total: 0.7 mg/dL (ref 0.0–1.2)
CO2: 23 mmol/L (ref 20–29)
Calcium: 10 mg/dL (ref 8.6–10.2)
Chloride: 98 mmol/L (ref 96–106)
Creatinine, Ser: 1.03 mg/dL (ref 0.76–1.27)
Globulin, Total: 3.2 g/dL (ref 1.5–4.5)
Glucose: 154 mg/dL — ABNORMAL HIGH (ref 70–99)
Potassium: 5.1 mmol/L (ref 3.5–5.2)
Sodium: 138 mmol/L (ref 134–144)
Total Protein: 7.8 g/dL (ref 6.0–8.5)
eGFR: 82 mL/min/{1.73_m2} (ref >59)

## 2022-12-16 ENCOUNTER — Other Ambulatory Visit: Payer: Self-pay | Admitting: Family

## 2022-12-16 DIAGNOSIS — E1169 Type 2 diabetes mellitus with other specified complication: Secondary | ICD-10-CM

## 2023-01-18 ENCOUNTER — Other Ambulatory Visit: Payer: Self-pay | Admitting: Family

## 2023-01-18 DIAGNOSIS — I10 Essential (primary) hypertension: Secondary | ICD-10-CM

## 2023-01-26 ENCOUNTER — Other Ambulatory Visit: Payer: Self-pay | Admitting: Family

## 2023-01-30 ENCOUNTER — Ambulatory Visit: Payer: 59 | Admitting: Family

## 2023-02-03 ENCOUNTER — Ambulatory Visit (INDEPENDENT_AMBULATORY_CARE_PROVIDER_SITE_OTHER): Payer: Medicare Other | Admitting: Family

## 2023-02-03 ENCOUNTER — Encounter: Payer: Self-pay | Admitting: Family

## 2023-02-03 VITALS — BP 111/69 | HR 64 | Temp 97.4°F | Ht 68.0 in | Wt 160.0 lb

## 2023-02-03 DIAGNOSIS — K219 Gastro-esophageal reflux disease without esophagitis: Secondary | ICD-10-CM

## 2023-02-03 DIAGNOSIS — K7030 Alcoholic cirrhosis of liver without ascites: Secondary | ICD-10-CM

## 2023-02-03 DIAGNOSIS — I10 Essential (primary) hypertension: Secondary | ICD-10-CM | POA: Diagnosis not present

## 2023-02-03 DIAGNOSIS — E1169 Type 2 diabetes mellitus with other specified complication: Secondary | ICD-10-CM | POA: Diagnosis not present

## 2023-02-03 DIAGNOSIS — Z0001 Encounter for general adult medical examination with abnormal findings: Secondary | ICD-10-CM

## 2023-02-03 DIAGNOSIS — Z794 Long term (current) use of insulin: Secondary | ICD-10-CM | POA: Diagnosis not present

## 2023-02-03 DIAGNOSIS — E785 Hyperlipidemia, unspecified: Secondary | ICD-10-CM | POA: Diagnosis not present

## 2023-02-03 DIAGNOSIS — Z0000 Encounter for general adult medical examination without abnormal findings: Secondary | ICD-10-CM

## 2023-02-03 DIAGNOSIS — F320 Major depressive disorder, single episode, mild: Secondary | ICD-10-CM | POA: Diagnosis not present

## 2023-02-03 DIAGNOSIS — E1142 Type 2 diabetes mellitus with diabetic polyneuropathy: Secondary | ICD-10-CM | POA: Diagnosis not present

## 2023-02-03 LAB — BAYER DCA HB A1C WAIVED: HB A1C (BAYER DCA - WAIVED): 6.5 % — ABNORMAL HIGH (ref 4.8–5.6)

## 2023-02-03 MED ORDER — SEMAGLUTIDE(0.25 OR 0.5MG/DOS) 2 MG/3ML ~~LOC~~ SOPN
0.5000 mg | PEN_INJECTOR | SUBCUTANEOUS | 2 refills | Status: DC
Start: 2023-02-03 — End: 2023-03-06

## 2023-02-03 NOTE — Progress Notes (Signed)
Subjective:    Patient ID: Thomas Pratt, male    DOB: 1961/02/06, 62 y.o.   MRN: [id number]  Chief Complaint  Patient presents with   Medical Management of Chronic Issues   PT presents to the office today for CPE and chronic follow up. He is followed by our clinical pharmacists  for DM education. He uses libre continuous glucose monitoring which has helped decrease his A1C.    He is followed by GI  for cirrhosis of liver. He has been sober since 03/2016. He had a colonoscopy and EGD on 04/23/22.   He is complaining of bilateral numbing pain in his foot and leg that is 8 out 10 when walking. Has diabetic neuropathy and takes gabapentin.  Hypertension This is a chronic problem. The current episode started more than 1 year ago. The problem has been resolved since onset. The problem is controlled. Associated symptoms include blurred vision and malaise/fatigue. Pertinent negatives include no peripheral edema or shortness of breath. Risk factors for coronary artery disease include dyslipidemia, diabetes mellitus, male gender and sedentary lifestyle. The current treatment provides moderate improvement.  Gastroesophageal Reflux He complains of belching and heartburn. This is a chronic problem. The current episode started more than 1 year ago. The problem occurs occasionally. He has tried a PPI for the symptoms. The treatment provided moderate relief.  Hyperlipidemia This is a chronic problem. The current episode started more than 1 year ago. The problem is controlled. Recent lipid tests were reviewed and are normal. Pertinent negatives include no shortness of breath. Current antihyperlipidemic treatment includes statins. The current treatment provides moderate improvement of lipids. Risk factors for coronary artery disease include diabetes mellitus, dyslipidemia, hypertension, male sex and a sedentary lifestyle.  Diabetes He presents for his follow-up diabetic visit. He has type 2 diabetes  mellitus. Associated symptoms include blurred vision and foot paresthesias. Diabetic complications include peripheral neuropathy. Risk factors for coronary artery disease include diabetes mellitus, dyslipidemia, hypertension, male sex and sedentary lifestyle. His overall blood glucose range is 140-180 mg/dl. Eye exam is not current.  Depression        This is a chronic problem.  The current episode started more than 1 year ago.   The problem occurs intermittently.  Associated symptoms include helplessness, hopelessness and sad.  Past treatments include SSRIs - Selective serotonin reuptake inhibitors.     Review of Systems  Constitutional:  Positive for malaise/fatigue.  Eyes:  Positive for blurred vision.  Respiratory:  Negative for shortness of breath.   Gastrointestinal:  Positive for heartburn.  Psychiatric/Behavioral:  Positive for depression.   All other systems reviewed and are negative.      Family History  Problem Relation Age of Onset   COPD Mother    Liver disease Neg Hx    Colon cancer Neg Hx    Colon polyps Neg Hx    Social History   Socioeconomic History   Marital status: Divorced    Spouse name: Not on file   Number of children: Not on file   Years of education: Not on file   Highest education level: Not on file  Occupational History   Occupation: unemployed    Comment: disability  Tobacco Use   Smoking status: Never   Smokeless tobacco: Never  Vaping Use   Vaping status: Never Used  Substance and Sexual Activity   Alcohol use: No    Comment: Quit September 2017, heavy alcohol abuse previously   Drug use: No   Sexual activity:  Not Currently    Partners: Female  Other Topics Concern   Not on file  Social History Narrative   Lives in [place], [place] with his youngest daughter; Is a retired Education administrator. Is on disability for cirrhosis.    Drank alcohol for years, quit 05/07/16. Has been sober since then. Quit b/c of health/cirrhosis.   Divorced for over 15 years.    Dating sporadically.   Has 3 daughters and 1 son live locally.   Originally from [place], moved here b/c of wife.    Eats all foods.    Wear seatbelt.   Does not drive, does not have a license, lost due to DWI.    Brought by neighbor.    Does not go to church. Believes in God. Prays each night, per report.    Social Determinants of Health   Financial Resource Strain: Low Risk  (11/20/2022)   Overall Financial Resource Strain (CARDIA)    Difficulty of Paying Living Expenses: Not hard at all  Food Insecurity: No Food Insecurity (11/20/2022)   Hunger Vital Sign    Worried About Running Out of Food in the Last Year: Never true    Ran Out of Food in the Last Year: Never true  Transportation Needs: No Transportation Needs (11/20/2022)   PRAPARE - Administrator, Civil Service (Medical): No    Lack of Transportation (Non-Medical): No  Physical Activity: Sufficiently Active (11/20/2022)   Exercise Vital Sign    Days of Exercise per Week: 5 days    Minutes of Exercise per Session: 30 min  Stress: No Stress Concern Present (11/20/2022)   [employer] of Occupational Health - Occupational Stress Questionnaire    Feeling of Stress : Not at all  Social Connections: Socially Isolated (11/20/2022)   Social Connection and Isolation Panel [NHANES]    Frequency of Communication with Friends and Family: More than three times a week    Frequency of Social Gatherings with Friends and Family: More than three times a week    Attends Religious Services: Never    Database administrator or Organizations: No    Attends Banker Meetings: Never    Marital Status: Divorced    Objective:   Physical Exam Vitals reviewed.  Constitutional:      General: He is not in acute distress.    Appearance: He is well-developed.  HENT:     Head: Normocephalic.     Right Ear: Tympanic membrane normal.     Left Ear: Tympanic membrane normal.  Eyes:     General:        Right eye: No discharge.         Left eye: No discharge.     Pupils: Pupils are equal, round, and reactive to light.  Neck:     Thyroid: No thyromegaly.  Cardiovascular:     Rate and Rhythm: Normal rate and regular rhythm.     Heart sounds: Normal heart sounds. No murmur heard. Pulmonary:     Effort: Pulmonary effort is normal. No respiratory distress.     Breath sounds: Normal breath sounds. No wheezing.  Abdominal:     General: Bowel sounds are normal. There is no distension.     Palpations: Abdomen is soft.     Tenderness: There is no abdominal tenderness.  Musculoskeletal:        General: No tenderness. Normal range of motion.     Cervical back: Normal range of motion and neck supple.  Skin:  General: Skin is warm and dry.     Findings: No erythema or rash.  Neurological:     Mental Status: He is alert and oriented to person, place, and time.     Cranial Nerves: No cranial nerve deficit.     Deep Tendon Reflexes: Reflexes are normal and symmetric.  Psychiatric:        Behavior: Behavior normal.        Thought Content: Thought content normal.        Judgment: Judgment normal.          BP 111/69   Pulse 64   Temp (!) 97.4 F (36.3 C) (Temporal)   Ht 5\' 8"  (1.727 m)   Wt 160 lb (72.6 kg)   SpO2 94%   BMI 24.33 kg/m   Assessment & Plan:  Garlen Reinig comes in today with chief complaint of Medical Management of Chronic Issues   Diagnosis and orders addressed:  1. Alcoholic cirrhosis of liver without ascites (HCC) - CMP14+EGFR - CBC with Differential/Platelet  2. Depression, major, single episode, mild (HCC) - CMP14+EGFR - CBC with Differential/Platelet  3. Type 2 diabetes mellitus with diabetic polyneuropathy, with long-term current use of insulin (HCC) -Increase Ozempic to 0.5 mg  Low carb diet  - Semaglutide,0.25 or 0.5MG /DOS, 2 MG/3ML SOPN; Inject 0.5 mg into the skin once a week.  Dispense: 3 mL; Refill: 2 - Bayer DCA Hb A1c Waived - CMP14+EGFR - CBC with  Differential/Platelet  4. Gastroesophageal reflux disease, unspecified whether esophagitis present - CMP14+EGFR - CBC with Differential/Platelet  5. Primary hypertension - CMP14+EGFR - CBC with Differential/Platelet  6. Hyperlipidemia associated with type 2 diabetes mellitus (HCC) - CMP14+EGFR - CBC with Differential/Platelet - Lipid panel  7. Annual physical exam - CMP14+EGFR - CBC with Differential/Platelet - Lipid panel - PSA, total and free - TSH   Labs pending Continue current medications  Will increase Ozempic to 0.5 mg from 0.25 mg, will continue [place]. May need to decrease if becomes hypoglycemic. Health Maintenance reviewed Diet and exercise encouraged  Follow up plan: 1 month to recheck DM after increase Ozempic   Jannifer Rodney, FNP

## 2023-02-03 NOTE — Patient Instructions (Signed)

## 2023-02-04 LAB — CBC WITH DIFFERENTIAL/PLATELET
Basophils Absolute: 0 10*3/uL (ref 0.0–0.2)
Basos: 1 %
EOS (ABSOLUTE): 0.2 10*3/uL (ref 0.0–0.4)
Eos: 3 %
Hematocrit: 45.2 % (ref 37.5–51.0)
Hemoglobin: 15.6 g/dL (ref 13.0–17.7)
Immature Grans (Abs): 0 10*3/uL (ref 0.0–0.1)
Immature Granulocytes: 0 %
Lymphocytes Absolute: 2.7 10*3/uL (ref 0.7–3.1)
Lymphs: 43 %
MCH: 33.1 pg — ABNORMAL HIGH (ref 26.6–33.0)
MCHC: 34.5 g/dL (ref 31.5–35.7)
MCV: 96 fL (ref 79–97)
Monocytes Absolute: 0.4 10*3/uL (ref 0.1–0.9)
Monocytes: 6 %
Neutrophils Absolute: 2.8 10*3/uL (ref 1.4–7.0)
Neutrophils: 47 %
Platelets: 179 10*3/uL (ref 150–450)
RBC: 4.71 x10E6/uL (ref 4.14–5.80)
RDW: 13 % (ref 11.6–15.4)
WBC: 6.1 10*3/uL (ref 3.4–10.8)

## 2023-02-04 LAB — LIPID PANEL
Chol/HDL Ratio: 3.5 ratio (ref 0.0–5.0)
Cholesterol, Total: 139 mg/dL (ref 100–199)
HDL: 40 mg/dL (ref >39)
LDL Chol Calc (NIH): 73 mg/dL (ref 0–99)
Triglycerides: 151 mg/dL — ABNORMAL HIGH (ref 0–149)
VLDL Cholesterol Cal: 26 mg/dL (ref 5–40)

## 2023-02-04 LAB — CMP14+EGFR
ALT: 21 IU/L (ref 0–44)
AST: 22 IU/L (ref 0–40)
Albumin: 4.5 g/dL (ref 3.9–4.9)
Alkaline Phosphatase: 48 IU/L (ref 44–121)
BUN/Creatinine Ratio: 16 (ref 10–24)
BUN: 14 mg/dL (ref 8–27)
Bilirubin Total: 0.4 mg/dL (ref 0.0–1.2)
CO2: 25 mmol/L (ref 20–29)
Calcium: 10.1 mg/dL (ref 8.6–10.2)
Chloride: 101 mmol/L (ref 96–106)
Creatinine, Ser: 0.89 mg/dL (ref 0.76–1.27)
Globulin, Total: 2.5 g/dL (ref 1.5–4.5)
Glucose: 104 mg/dL — ABNORMAL HIGH (ref 70–99)
Potassium: 4.8 mmol/L (ref 3.5–5.2)
Sodium: 138 mmol/L (ref 134–144)
Total Protein: 7 g/dL (ref 6.0–8.5)
eGFR: 96 mL/min/{1.73_m2} (ref >59)

## 2023-02-04 LAB — PSA, TOTAL AND FREE
PSA, Free Pct: 28.3 %
PSA, Free: 0.17 ng/mL
Prostate Specific Ag, Serum: 0.6 ng/mL (ref 0.0–4.0)

## 2023-02-04 LAB — TSH: TSH: 1.44 u[IU]/mL (ref 0.450–4.500)

## 2023-02-13 DIAGNOSIS — K7460 Unspecified cirrhosis of liver: Secondary | ICD-10-CM | POA: Diagnosis not present

## 2023-02-14 ENCOUNTER — Other Ambulatory Visit: Payer: Self-pay | Admitting: Family

## 2023-02-14 DIAGNOSIS — K219 Gastro-esophageal reflux disease without esophagitis: Secondary | ICD-10-CM

## 2023-03-01 ENCOUNTER — Telehealth: Payer: Self-pay | Admitting: Gastroenterology

## 2023-03-01 NOTE — Telephone Encounter (Signed)
Reviewed liver transplant ov note. MELD now improved. No need for patient to follow up with them unless develops decompensation. He will need ongoing follow up here.   Please arrange a follow up with Dr. Jena Gauss in 04/2023 for cirrhosis, weight loss.

## 2023-03-02 ENCOUNTER — Other Ambulatory Visit: Payer: Self-pay | Admitting: Family

## 2023-03-06 ENCOUNTER — Ambulatory Visit (INDEPENDENT_AMBULATORY_CARE_PROVIDER_SITE_OTHER): Payer: Medicare Other | Admitting: Family

## 2023-03-06 ENCOUNTER — Encounter: Payer: Self-pay | Admitting: Family

## 2023-03-06 VITALS — BP 108/70 | HR 69 | Temp 97.7°F | Ht 68.0 in | Wt 160.4 lb

## 2023-03-06 DIAGNOSIS — Z794 Long term (current) use of insulin: Secondary | ICD-10-CM

## 2023-03-06 DIAGNOSIS — I10 Essential (primary) hypertension: Secondary | ICD-10-CM | POA: Diagnosis not present

## 2023-03-06 DIAGNOSIS — E1142 Type 2 diabetes mellitus with diabetic polyneuropathy: Secondary | ICD-10-CM

## 2023-03-06 MED ORDER — SEMAGLUTIDE (1 MG/DOSE) 4 MG/3ML ~~LOC~~ SOPN
1.0000 mg | PEN_INJECTOR | SUBCUTANEOUS | 2 refills | Status: AC
Start: 2023-03-06 — End: ?

## 2023-03-06 MED ORDER — ASPIRIN 81 MG PO TBEC: 81.0000 mg | DELAYED_RELEASE_TABLET | Freq: Every day | ORAL | 1 refills | Status: AC

## 2023-03-06 NOTE — Progress Notes (Signed)
Subjective:    Patient ID: Thomas Pratt, male    DOB: [birth year], 62 y.o.   MRN: [id number]  No chief complaint on file.  Pt presents to the office today to follow up on Ozempic. He was started on Ozempic 0.25 mg and then increased to 0.5 mg last week. Denies any N&V.      03/06/2023    4:03 PM 02/03/2023    3:20 PM 11/26/2022   10:27 AM  Last 3 Weights  Weight (lbs) 160 lb 6.4 oz 160 lb 159 lb 9.6 oz  Weight (kg) 72.757 kg 72.576 kg 72.394 kg     Diabetes He presents for his follow-up diabetic visit. He has type 2 diabetes mellitus. Associated symptoms include blurred vision and foot paresthesias. Risk factors for coronary artery disease include dyslipidemia, diabetes mellitus, sedentary lifestyle, male sex and hypertension. He is following a generally unhealthy diet. His overall blood glucose range is 140-180 mg/dl.  Hypertension This is a chronic problem. The current episode started more than 1 year ago. The problem has been resolved since onset. The problem is controlled. Associated symptoms include blurred vision and malaise/fatigue. Pertinent negatives include no peripheral edema or shortness of breath. Risk factors for coronary artery disease include dyslipidemia, diabetes mellitus and obesity.      Review of Systems  Constitutional:  Positive for malaise/fatigue.  Eyes:  Positive for blurred vision.  Respiratory:  Negative for shortness of breath.   All other systems reviewed and are negative.      Objective:   Physical Exam Vitals reviewed.  Constitutional:      General: He is not in acute distress.    Appearance: He is well-developed.  HENT:     Head: Normocephalic.     Right Ear: Tympanic membrane normal.     Left Ear: Tympanic membrane normal.  Eyes:     General:        Right eye: No discharge.        Left eye: No discharge.     Pupils: Pupils are equal, round, and reactive to light.  Neck:     Thyroid: No thyromegaly.  Cardiovascular:     Rate and  Rhythm: Normal rate and regular rhythm.     Heart sounds: Normal heart sounds. No murmur heard. Pulmonary:     Effort: Pulmonary effort is normal. No respiratory distress.     Breath sounds: Normal breath sounds. No wheezing.  Abdominal:     General: Bowel sounds are normal. There is no distension.     Palpations: Abdomen is soft.     Tenderness: There is no abdominal tenderness.  Musculoskeletal:        General: No tenderness. Normal range of motion.     Cervical back: Normal range of motion and neck supple.  Skin:    General: Skin is warm and dry.     Findings: No erythema or rash.  Neurological:     Mental Status: He is alert and oriented to person, place, and time.     Cranial Nerves: No cranial nerve deficit.     Deep Tendon Reflexes: Reflexes are normal and symmetric.  Psychiatric:        Behavior: Behavior normal.        Thought Content: Thought content normal.        Judgment: Judgment normal.       BP 108/70   Pulse 69   Temp 97.7 F (36.5 C) (Temporal)   Ht 5\' 8"  (1.727 m)  Wt 160 lb 6.4 oz (72.8 kg)   SpO2 97%   BMI 24.39 kg/m      Assessment & Plan:  Thomas Pratt comes in today with chief complaint of Diabetes and body pain   Diagnosis and orders addressed:  1. Primary hypertension  2. Type 2 diabetes mellitus with diabetic polyneuropathy, with long-term current use of insulin (HCC) Will increase Ozempic 1 mg next month Low carb diet  Follow up in 2 months  - Semaglutide, 1 MG/DOSE, 4 MG/3ML SOPN; Inject 1 mg as directed once a week.  Dispense: 3 mL; Refill: 2 - Microalbumin / creatinine urine ratio   Labs pending Health Maintenance reviewed Diet and exercise encouraged  Follow up plan: 2 months   Jannifer Rodney, FNP

## 2023-03-06 NOTE — Patient Instructions (Signed)

## 2023-03-07 LAB — MICROALBUMIN / CREATININE URINE RATIO
Creatinine, Urine: 51.5 mg/dL
Microalb/Creat Ratio: <6 mg/g{creat} (ref 0–29)
Microalbumin, Urine: <3 ug/mL

## 2023-03-12 ENCOUNTER — Other Ambulatory Visit: Payer: Self-pay | Admitting: Family

## 2023-03-12 DIAGNOSIS — J301 Allergic rhinitis due to pollen: Secondary | ICD-10-CM

## 2023-03-20 ENCOUNTER — Encounter: Payer: Self-pay | Admitting: Gastroenterology

## 2023-03-31 ENCOUNTER — Other Ambulatory Visit: Payer: Self-pay | Admitting: Family

## 2023-03-31 DIAGNOSIS — E1169 Type 2 diabetes mellitus with other specified complication: Secondary | ICD-10-CM

## 2023-04-17 ENCOUNTER — Other Ambulatory Visit: Payer: Self-pay | Admitting: Gastroenterology

## 2023-04-17 DIAGNOSIS — K7460 Unspecified cirrhosis of liver: Secondary | ICD-10-CM

## 2023-04-17 DIAGNOSIS — K766 Portal hypertension: Secondary | ICD-10-CM

## 2023-04-17 DIAGNOSIS — I8510 Secondary esophageal varices without bleeding: Secondary | ICD-10-CM

## 2023-04-17 DIAGNOSIS — F1011 Alcohol abuse, in remission: Secondary | ICD-10-CM

## 2023-04-18 ENCOUNTER — Other Ambulatory Visit (HOSPITAL_COMMUNITY)
Admission: RE | Admit: 2023-04-18 | Discharge: 2023-04-18 | Disposition: A | Discharge status: Home / Self Care | Payer: Medicare Other | Source: Ambulatory Visit | Attending: Gastroenterology | Admitting: Gastroenterology

## 2023-04-18 ENCOUNTER — Encounter: Payer: Self-pay | Admitting: Gastroenterology

## 2023-04-18 ENCOUNTER — Ambulatory Visit (INDEPENDENT_AMBULATORY_CARE_PROVIDER_SITE_OTHER): Payer: Medicare Other | Admitting: Gastroenterology

## 2023-04-18 VITALS — BP 115/72 | HR 68 | Temp 98.7°F | Ht 68.0 in | Wt 162.6 lb

## 2023-04-18 DIAGNOSIS — G8929 Other chronic pain: Secondary | ICD-10-CM | POA: Insufficient documentation

## 2023-04-18 DIAGNOSIS — R109 Unspecified abdominal pain: Secondary | ICD-10-CM | POA: Diagnosis not present

## 2023-04-18 DIAGNOSIS — K7030 Alcoholic cirrhosis of liver without ascites: Secondary | ICD-10-CM | POA: Diagnosis present

## 2023-04-18 LAB — CBC WITH DIFFERENTIAL/PLATELET
Abs Immature Granulocytes: 0.01 10*3/uL (ref 0.00–0.07)
Basophils Absolute: 0 10*3/uL (ref 0.0–0.1)
Basophils Relative: 0 %
Eosinophils Absolute: 0.2 10*3/uL (ref 0.0–0.5)
Eosinophils Relative: 3 %
HCT: 49 % (ref 39.0–52.0)
Hemoglobin: 17.2 g/dL — ABNORMAL HIGH (ref 13.0–17.0)
Immature Granulocytes: 0 %
Lymphocytes Relative: 34 %
Lymphs Abs: 1.8 10*3/uL (ref 0.7–4.0)
MCH: 33.9 pg (ref 26.0–34.0)
MCHC: 35.1 g/dL (ref 30.0–36.0)
MCV: 96.5 fL (ref 80.0–100.0)
Monocytes Absolute: 0.3 10*3/uL (ref 0.1–1.0)
Monocytes Relative: 6 %
Neutro Abs: 3 10*3/uL (ref 1.7–7.7)
Neutrophils Relative %: 57 %
Platelets: 134 10*3/uL — ABNORMAL LOW (ref 150–400)
RBC: 5.08 MIL/uL (ref 4.22–5.81)
RDW: 13 % (ref 11.5–15.5)
WBC: 5.3 10*3/uL (ref 4.0–10.5)
nRBC: 0 % (ref 0.0–0.2)

## 2023-04-18 LAB — VITAMIN D 25 HYDROXY (VIT D DEFICIENCY, FRACTURES): Vit D, 25-Hydroxy: 34.27 ng/mL (ref 30–100)

## 2023-04-18 LAB — COMPREHENSIVE METABOLIC PANEL
ALT: 27 U/L (ref 0–44)
AST: 27 U/L (ref 15–41)
Albumin: 4.7 g/dL (ref 3.5–5.0)
Alkaline Phosphatase: 49 U/L (ref 38–126)
Anion gap: 11 (ref 5–15)
BUN: 13 mg/dL (ref 8–23)
CO2: 26 mmol/L (ref 22–32)
Calcium: 10.3 mg/dL (ref 8.9–10.3)
Chloride: 98 mmol/L (ref 98–111)
Creatinine, Ser: 0.96 mg/dL (ref 0.61–1.24)
GFR, Estimated: >60 mL/min (ref >60)
Glucose, Bld: 109 mg/dL — ABNORMAL HIGH (ref 70–99)
Potassium: 4.8 mmol/L (ref 3.5–5.1)
Sodium: 135 mmol/L (ref 135–145)
Total Bilirubin: 1.1 mg/dL (ref 0.3–1.2)
Total Protein: 8.2 g/dL — ABNORMAL HIGH (ref 6.5–8.1)

## 2023-04-18 LAB — PROTIME-INR
INR: 1 (ref 0.8–1.2)
Prothrombin Time: 13.6 s (ref 11.4–15.2)

## 2023-04-18 NOTE — Progress Notes (Signed)
GI Office Note    Referring Provider: Junie Spencer, FNP Primary Care Physician:  Junie Spencer, FNP  Primary Gastroenterologist:Michael Jena Gauss, MD   Chief Complaint   Chief Complaint  Patient presents with   Follow-up    States that his stomach has been hurting    History of Present Illness   Thomas Pratt is a 62 y.o. male presenting today for follow up. Last seen 10/2022.   He has history cirrhosis presumably due to prior alcohol abuse (sober since 2017) however, he had positive AMA, AMA, ASMA (on repeat negative), elevated IgG, liver biopsy with granulomatous hepatitis. Evaluated by [facility] liver transplant center.  Labs repeated and they reviewed liver biopsy slides and suspected cirrhosis secondary to alcohol use but did have hepatic sarcoidosis.  Pulmonary work-up negative for lung involvement.  Delma Freeze was stopped by [facility] liver.  Advised to complete 6 months of substance abuse counseling and return if liver disease decompensates. He has had history of UGI bleeding in 2021, EGD showed innocent appearing and barely grade 2 esophageal varices, mild portal hypertensive gastropathy, no evidence of bleeding from upper GI tract.  He has been on nadolol 40 mg daily.  Also on Xifaxan 550 mg twice daily (patient reports taking once daily) and low-dose diuretics.   History of chronic abdominal pain. Left upper quadrant and right lower quadrant abdominal pain. Completed imaging in February 2023 via CT abdomen pelvis with contrast. He had stable small umbilical hernia containing fat only, no evidence of appendicitis or recurrent inguinal hernia. Stable findings of hepatic cirrhosis and portal venous hypertension. In the past several years he has had his gallbladder removed as well as right inguinal hernia repair. Completed EGD and colonoscopy 04/2022 with several tubular adenomas removed, grade 1 esophageal varices, gastritis noted. He had a small hiatal hernia.   After last ov, his MELD  Na was up from 7 to 16 largely due to low sodium and bump in total bilirubin we held diuretics for one week and repeated labs.  MELD Na down to 12. We advised holding diuretics but ok to resume lasix 20mg  and aldactone 50mg  daily if swelling returning. We referred back to [facility] liver for reevaluation.  Seen at [facility] liver 01/2023. Due to improved MELD on latest labs, recommended to continue to follow MELD and PETH locally, continue diuretics, continue AFP/Ultrasound every six months, check vitamin D level and if abnormal get Dexa.   Overall doing well. He continues to have stable intermittent chronic abdominal pain. Pain typically located near umbilicus and upper abdomen. Can last for a day at a time, occurs twice per week. No specific modifying factors. No n/v. BM regular. No melena, brbpr. No heartburn. Weight stable. No lower extremity edema.  Recently started ASA 81 mg daily. Started semaglutide, currently on 1mg  SQ weekly.        Colonoscopy 04/2022: -nonbleeding internal hemorrhoids -four 4-48mm polyps removed. -tubular adenomas -next colonoscopy 5 years   EGD 04/2022: -grade I esophageal varices -gastritis -small hh -repeat EGD in two years  Medications   Current Outpatient Medications  Medication Sig Dispense Refill   Accu-Chek Softclix Lancets lancets Test BS 4 (four) times daily. Dx E11.69 (Patient taking differently: 1 each by Other route 4 (four) times daily as needed. Test BS 4 (four) times daily. Dx E11.69) 400 each 3   aspirin EC 81 MG tablet Take 1 tablet (81 mg total) by mouth daily. Swallow whole. 90 tablet 1   atorvastatin (LIPITOR) 10  MG tablet Take 1 tablet by mouth once daily 90 tablet 1   Blood Glucose Monitoring Suppl (ACCU-CHEK GUIDE) w/Device KIT Test BS 4 (four) times daily. Dx E11.69 1 kit 0   cetirizine (ZYRTEC) 10 MG tablet Take 1 tablet by mouth once daily 90 tablet 3   Continuous Blood Gluc Sensor (FREESTYLE LIBRE 2 SENSOR) MISC Use to test blood sugars 6x  daily as directed 2 each 3   dexlansoprazole (DEXILANT) 60 MG capsule Take 1 capsule by mouth once daily 90 capsule 0   diclofenac Sodium (VOLTAREN) 1 % GEL Apply 2 g topically 4 (four) times daily. 100 g 0   escitalopram (LEXAPRO) 20 MG tablet Take 1 tablet (20 mg total) by mouth daily. 90 tablet 1   FARXIGA 10 MG TABS tablet TAKE 1 TABLET BY MOUTH ONCE DAILY BEFORE BREAKFAST 90 tablet 0   fluticasone (FLONASE) 50 MCG/ACT nasal spray Use 2 spray(s) in each nostril once daily (Patient taking differently: Place 2 sprays into both nostrils daily as needed for allergies.) 16 g 5   furosemide (LASIX) 20 MG tablet Take 1 tablet by mouth once daily 90 tablet 0   gabapentin (NEURONTIN) 800 MG tablet Take 1 tablet (800 mg total) by mouth 3 (three) times daily. 270 tablet 1   glucose blood (ACCU-CHEK GUIDE) test strip Test BS 4 (four) times daily. Dx E11.69 400 each 3   Insulin Pen Needle (B-D UF III MINI PEN NEEDLES) 31G X 5 MM MISC USE WITH TRESIBA DAILY 100 each 3   metFORMIN (GLUCOPHAGE-XR) 750 MG 24 hr tablet TAKE 2 TABLETS BY MOUTH ONCE DAILY WITH BREAKFAST 180 tablet 0   nadolol (CORGARD) 40 MG tablet Take 1 tablet by mouth once daily 90 tablet 3   nystatin (MYCOSTATIN/NYSTOP) powder SMARTSIG:Packet(s) Topical 3 Times Daily     nystatin cream (MYCOSTATIN) Apply topically 2 (two) times daily.     rifaximin (XIFAXAN) 550 MG TABS tablet Take 1 tablet (550 mg total) by mouth 2 (two) times daily. (Patient taking differently: Take 550 mg by mouth daily.) 180 tablet 3   Semaglutide, 1 MG/DOSE, 4 MG/3ML SOPN Inject 1 mg as directed once a week. 3 mL 2   spironolactone (ALDACTONE) 50 MG tablet Take 1 tablet by mouth once daily 90 tablet 0   TRESIBA FLEXTOUCH 100 UNIT/ML FlexTouch Pen INJECT 25 UNITS SUBCUTANEOUSLY AT BEDTIME 15 mL 0   VITAMIN D PO Take 1 capsule by mouth daily.      No current facility-administered medications for this visit.    Allergies   Allergies as of 04/18/2023   (No Known  Allergies)     Review of Systems   General: Negative for anorexia, weight loss, fever, chills, fatigue, weakness. ENT: Negative for hoarseness, difficulty swallowing , nasal congestion. CV: Negative for chest pain, angina, palpitations, dyspnea on exertion, peripheral edema.  Respiratory: Negative for dyspnea at rest, dyspnea on exertion, cough, sputum, wheezing.  GI: See history of present illness. GU:  Negative for dysuria, hematuria, urinary incontinence, urinary frequency, nocturnal urination.  Endo: Negative for unusual weight change.     Physical Exam   BP 115/72 (BP Location: Right Arm, Patient Position: Sitting, Cuff Size: Normal)   Pulse 68   Temp 98.7 F (37.1 C) (Oral)   Ht 5\' 8"  (1.727 m)   Wt 162 lb 9.6 oz (73.8 kg)   SpO2 98%   BMI 24.72 kg/m    General: Well-nourished, well-developed in no acute distress.  Eyes: No icterus. Mouth:  Oropharyngeal mucosa moist and pink   Lungs: Clear to auscultation bilaterally.  Heart: Regular rate and rhythm, no murmurs rubs or gallops.  Abdomen: Bowel sounds are normal, nontender, nondistended, no hepatosplenomegaly or masses,  no abdominal bruits, no rebound or guarding. Tiny umb hernia Rectal: not performed Extremities: No lower extremity edema. No clubbing or deformities. Neuro: Alert and oriented x 4   Skin: Warm and dry, no jaundice.   Psych: Alert and cooperative, normal mood and affect.  Labs   01/2023: TSH 1.440, CBC normal, cre 0.89, LFTs normal, A1C 6.5  Imaging Studies   No results found.  Assessment   *Alcohol related cirrhosis  *Chronic abdominal pain  Seen at [facility] liver 01/2023 after decline in MELD. Fortunately his MELD improved and he has no need for immediate follow up with [facility]. We will continue to follow his labs, hepatoma screening. Will recheck vitamin D and PETH per [facility] recommendations. Patient aware. He has been sober since 2017.  Chronic intermittent abdominal pain unclear etiology. Extensive  work up. Stable.   PLAN   Complete ultrasound and labs. Continue current medication (Dexilant, furosemide, nadolol, rifaximin, vit D, spironolactone). Ov in six months with Lewie Loron, NP (patient would like to see her next time, followed patient in past)   Leanna Battles. Melvyn Neth, MHS, PA-C [facility]

## 2023-04-18 NOTE — Patient Instructions (Signed)
Complete ultrasound and labs. We will be in touch with results as available. Continue current medications that we have you on, Dexilant, furosemide, nadolol, rifaximin, vitamin D, spironolactone.  Return office visit in six months.

## 2023-04-19 LAB — AFP TUMOR MARKER: AFP, Serum, Tumor Marker: 2 ng/mL (ref 0.0–8.4)

## 2023-04-22 ENCOUNTER — Ambulatory Visit (HOSPITAL_COMMUNITY): Payer: Medicare Other

## 2023-04-22 LAB — MISC LABCORP TEST (SEND OUT): Labcorp test code: 791584

## 2023-04-23 ENCOUNTER — Encounter: Payer: Self-pay | Admitting: *Deleted

## 2023-05-01 ENCOUNTER — Ambulatory Visit (HOSPITAL_COMMUNITY): Admission: RE | Admit: 2023-05-01 | Payer: Medicare Other | Source: Ambulatory Visit

## 2023-05-01 NOTE — Telephone Encounter (Signed)
Patient called in. He has been having issues with his phone not getting calls or messages. I provided CS # to reschedule his [place].

## 2023-05-05 ENCOUNTER — Ambulatory Visit (HOSPITAL_COMMUNITY): Payer: Medicare Other

## 2023-05-06 ENCOUNTER — Ambulatory Visit (INDEPENDENT_AMBULATORY_CARE_PROVIDER_SITE_OTHER): Payer: Medicare Other | Admitting: Family

## 2023-05-06 ENCOUNTER — Encounter: Payer: Self-pay | Admitting: Family

## 2023-05-06 VITALS — BP 115/76 | HR 69 | Temp 97.7°F | Ht 68.0 in | Wt 166.0 lb

## 2023-05-06 DIAGNOSIS — Z23 Encounter for immunization: Secondary | ICD-10-CM | POA: Diagnosis not present

## 2023-05-06 DIAGNOSIS — I10 Essential (primary) hypertension: Secondary | ICD-10-CM

## 2023-05-06 DIAGNOSIS — E1142 Type 2 diabetes mellitus with diabetic polyneuropathy: Secondary | ICD-10-CM | POA: Diagnosis not present

## 2023-05-06 DIAGNOSIS — K7030 Alcoholic cirrhosis of liver without ascites: Secondary | ICD-10-CM

## 2023-05-06 DIAGNOSIS — E785 Hyperlipidemia, unspecified: Secondary | ICD-10-CM | POA: Diagnosis not present

## 2023-05-06 DIAGNOSIS — E1169 Type 2 diabetes mellitus with other specified complication: Secondary | ICD-10-CM | POA: Diagnosis not present

## 2023-05-06 DIAGNOSIS — F320 Major depressive disorder, single episode, mild: Secondary | ICD-10-CM

## 2023-05-06 DIAGNOSIS — Z794 Long term (current) use of insulin: Secondary | ICD-10-CM | POA: Diagnosis not present

## 2023-05-06 DIAGNOSIS — K219 Gastro-esophageal reflux disease without esophagitis: Secondary | ICD-10-CM | POA: Diagnosis not present

## 2023-05-06 DIAGNOSIS — F1011 Alcohol abuse, in remission: Secondary | ICD-10-CM

## 2023-05-06 LAB — BAYER DCA HB A1C WAIVED: HB A1C (BAYER DCA - WAIVED): 6.5 % — ABNORMAL HIGH (ref 4.8–5.6)

## 2023-05-06 NOTE — Patient Instructions (Signed)
Diabetic Neuropathy Diabetic neuropathy refers to nerve damage that is caused by diabetes. Over time, people with diabetes can develop nerve damage throughout the body. There are several types of diabetic neuropathy: Peripheral neuropathy. This is the most common type of diabetic neuropathy. It damages the nerves that carry signals between the spinal cord and other parts of the body (peripheral nerves). This usually affects nerves in the feet, legs, hands, and arms. Autonomic neuropathy. This type causes damage to nerves that control involuntary functions (autonomic nerves). Involuntary functions are functions of the body that you do not control. They include heartbeat, body temperature, blood pressure, urination, digestion, sweating, sexual function, or response to changes in blood glucose. Focal neuropathy. This type of nerve damage affects one area of the body, such as an arm, a leg, or the face. The injury may involve one nerve or a small group of nerves. Focal neuropathy can be painful and unpredictable. It occurs most often in older adults with diabetes. This often develops suddenly, but usually improves over time and does not cause long-term problems. Proximal neuropathy. This type of nerve damage affects the nerves of the thighs, hips, buttocks, or legs. It causes severe pain, weakness, and muscle death (atrophy), usually in the thigh muscles. It is more common among older men and people who have type 2 diabetes. The length of recovery time may vary. What are the causes? Peripheral, autonomic, and focal neuropathies are caused by diabetes that is not well controlled with treatment. The cause of proximal neuropathy is not known, but it may be caused by inflammation related to uncontrolled blood glucose levels. What are the signs or symptoms? Peripheral neuropathy Peripheral neuropathy develops slowly over time. When the nerves of the feet and legs no longer work, you may experience: Burning,  stabbing, or aching pain in the legs or feet. Pain or cramping in the legs or feet. Loss of feeling (numbness) and inability to feel pressure or pain in the feet. This can lead to: Thick calluses or sores on areas of constant pressure. Ulcers. Reduced ability to feel temperature changes. Foot deformities. Muscle weakness. Loss of balance or coordination. Autonomic neuropathy The symptoms of autonomic neuropathy vary depending on which nerves are affected. Symptoms may include: Problems with digestion, such as: Nausea or vomiting. Poor appetite. Bloating. Diarrhea or constipation. Trouble swallowing. Losing weight without trying to. Problems with the heart, blood, and lungs, such as: Dizziness, especially when standing up. Fainting. Shortness of breath. Irregular heartbeat. Bladder problems, such as: Trouble starting or stopping urination. Leaking urine. Trouble emptying the bladder. Urinary tract infections (UTIs). Problems with other body functions, such as: Sweat. You may sweat too much or too little. Temperature. You might get hot easily. Or, you might feel cold more than usual. Sexual function. Men may not be able to get or maintain an erection. Women may have vaginal dryness and difficulty with arousal. Focal neuropathy Symptoms affect only one area of the body. Common symptoms include: Numbness. Tingling. Burning pain. Prickling feeling. Very sensitive skin. Weakness. Inability to move (paralysis). Muscle twitching. Muscles getting smaller (wasting). Poor coordination. Double or blurred vision. Proximal neuropathy Sudden, severe pain in the hip, thigh, or buttocks. Pain may spread from the back into the legs (sciatica). Pain and numbness in the arms and legs. Tingling. Loss of bladder control or bowel control. Weakness and wasting of thigh muscles. Difficulty getting up from a seated position. Abdominal swelling. Unexplained weight loss. How is this  diagnosed? Diagnosis varies depending on the type  of neuropathy your health care provider suspects. Peripheral neuropathy Your health care provider will do a neurologic exam. This exam checks your reflexes, how you move, and what you can feel. You may have other tests, such as: Blood tests. Tests of the fluid that surrounds the spinal cord (lumbar puncture). CT scan. MRI. Checking the nerves that control muscles (electromyogram, or EMG). Checking how quickly signals pass through your nerves (nerve conduction study). Checking a small piece of a nerve using a microscope (biopsy). Autonomic neuropathy You may have tests, such as: Tests to measure your blood pressure and heart rate. You may be secured to an exam table that moves you from a lying position to an upright position (table tilt test). Breathing tests to check your lungs. Tests to check how food moves through the digestive system (gastric emptying tests). Blood, sweat, or urine tests. Ultrasound of your bladder. Spinal fluid tests. Focal neuropathy This condition may be diagnosed with: A neurologic exam. CT scan. MRI. EMG. Nerve conduction study. Proximal neuropathy There is no test to diagnose this type of neuropathy. You may have tests to rule out other possible causes of this type of neuropathy. Tests may include: X-rays of your spine and lumbar region. Lumbar puncture. MRI. How is this treated? The goal of treatment is to keep nerve damage from getting worse. Treatment may include: Following your diabetes management plan. This will help keep your blood glucose level and your A1C level within your target range. This is the most important treatment. Using prescription pain medicine. Follow these instructions at home: Diabetes management Follow your diabetes management plan as told by your health care provider. Check your blood glucose levels. Keep your blood glucose in your target range. Have your A1C level checked at  least two times a year, or as often as told. Take over the counter and prescription medicines only as told by your health care provider. This includes insulin and diabetes medicine.  Lifestyle  Do not use any products that contain nicotine or tobacco, such as cigarettes, e-cigarettes, and chewing tobacco. If you need help quitting, ask your health care provider. Be physically active every day. Include strength training and balance exercises. Follow a healthy meal plan. Work with your health care provider to manage your blood pressure. General instructions Ask your health care provider if the medicine prescribed to you requires you to avoid driving or using machinery. Check your skin and feet every day for cuts, bruises, redness, blisters, or sores. Keep all follow-up visits. This is important. Contact a health care provider if: You have burning, stabbing, or aching pain in your legs or feet. You are unable to feel pressure or pain in your feet. You develop problems with digestion, such as: Nausea. Vomiting. Bloating. Constipation. Diarrhea. Abdominal pain. You have difficulty with urination, such as: Inability to control when you urinate (incontinence). Inability to completely empty the bladder (retention). You feel as if your heart is racing (palpitations). You feel dizzy, weak, or faint when you stand up. Get help right away if: You cannot urinate. You have sudden weakness or loss of coordination. You have trouble speaking. You have pain or pressure in your chest. You have an irregular heartbeat. You have sudden inability to move a part of your body. These symptoms may represent a serious problem that is an emergency. Do not wait to see if the symptoms will go away. Get medical help right away. Call your local emergency services (911 in the [place] Do not drive yourself to  the hospital. Summary Diabetic neuropathy is nerve damage that is caused by diabetes. It can cause numbness  and pain in the arms, legs, digestive tract, heart, and other body systems. This condition is treated by keeping your blood glucose level and your A1C level within your target range. This can help prevent neuropathy from getting worse. Check your skin and feet every day for cuts, bruises, redness, blisters, or sores. Do not use any products that contain nicotine or tobacco, such as cigarettes, e-cigarettes, and chewing tobacco. This information is not intended to replace advice given to you by your health care provider. Make sure you discuss any questions you have with your health care provider. Document Revised: 11/18/2019 Document Reviewed: 11/18/2019 Elsevier Patient Education  2024 [employer].

## 2023-05-06 NOTE — Progress Notes (Signed)
Subjective:    Patient ID: Thomas Pratt, male    DOB: 12/12/1960, 62 y.o.   MRN: [id number]  Chief Complaint  Patient presents with   Medical Management of Chronic Issues    Weak and bone pain all over    PT presents to the office today for chronic follow up. He is followed by our clinical pharmacists  for DM education. He uses libre continuous glucose monitoring which has helped decrease his A1C.    He is followed by GI  for cirrhosis of liver. He has been sober since 03/2016. He had a colonoscopy and EGD on 04/23/22.   He is complaining of bilateral numbing pain in his foot and leg that is 9 out 10 when walking. Has diabetic neuropathy and takes gabapentin.   He is currently taking Ozempic 1 mg. Doing well and denies any nausea or vomiting.      05/06/2023    3:46 PM 04/18/2023    9:15 AM 03/06/2023    4:03 PM  Last 3 Weights  Weight (lbs) 166 lb 162 lb 9.6 oz 160 lb 6.4 oz  Weight (kg) 75.297 kg 73.755 kg 72.757 kg     Hypertension This is a chronic problem. The current episode started more than 1 year ago. The problem has been resolved since onset. Associated symptoms include blurred vision and malaise/fatigue. Pertinent negatives include no peripheral edema, PND or shortness of breath. Risk factors for coronary artery disease include diabetes mellitus, dyslipidemia, male gender and sedentary lifestyle. The current treatment provides moderate improvement.  Gastroesophageal Reflux He complains of belching and heartburn. This is a chronic problem. The problem occurs rarely. Associated symptoms include fatigue. He has tried a PPI for the symptoms. The treatment provided moderate relief.  Hyperlipidemia This is a chronic problem. The current episode started more than 1 year ago. The problem is controlled. Recent lipid tests were reviewed and are normal. Pertinent negatives include no shortness of breath. Current antihyperlipidemic treatment includes statins. The current  treatment provides moderate improvement of lipids. Risk factors for coronary artery disease include dyslipidemia, diabetes mellitus, hypertension and a sedentary lifestyle.  Diabetes He presents for his follow-up diabetic visit. He has type 2 diabetes mellitus. Associated symptoms include blurred vision, fatigue and foot paresthesias. Diabetic complications include peripheral neuropathy. Risk factors for coronary artery disease include dyslipidemia, diabetes mellitus, male sex, hypertension and sedentary lifestyle. He is following a generally healthy diet. His overall blood glucose range is 140-180 mg/dl. Eye exam is not current.  Depression        This is a chronic problem.  The current episode started more than 1 year ago.   Associated symptoms include fatigue, helplessness and hopelessness.  Associated symptoms include not sad.  Past treatments include SSRIs - Selective serotonin reuptake inhibitors.     Review of Systems  Constitutional:  Positive for fatigue and malaise/fatigue.  Eyes:  Positive for blurred vision.  Respiratory:  Negative for shortness of breath.   Cardiovascular:  Negative for PND.  Gastrointestinal:  Positive for heartburn.  Psychiatric/Behavioral:  Positive for depression.   All other systems reviewed and are negative.      Objective:   Physical Exam Vitals reviewed.  Constitutional:      General: He is not in acute distress.    Appearance: He is well-developed.  HENT:     Head: Normocephalic.     Right Ear: Tympanic membrane normal.     Left Ear: Tympanic membrane normal.  Eyes:  General:        Right eye: No discharge.        Left eye: No discharge.     Pupils: Pupils are equal, round, and reactive to light.  Neck:     Thyroid: No thyromegaly.  Cardiovascular:     Rate and Rhythm: Normal rate and regular rhythm.     Heart sounds: Normal heart sounds. No murmur heard. Pulmonary:     Effort: Pulmonary effort is normal. No respiratory distress.      Breath sounds: Normal breath sounds. No wheezing.  Abdominal:     General: Bowel sounds are normal. There is no distension.     Palpations: Abdomen is soft.     Tenderness: There is no abdominal tenderness.  Musculoskeletal:        General: No tenderness. Normal range of motion.     Cervical back: Normal range of motion and neck supple.  Skin:    General: Skin is warm and dry.     Findings: No erythema or rash.  Neurological:     Mental Status: He is alert and oriented to person, place, and time.     Cranial Nerves: No cranial nerve deficit.     Deep Tendon Reflexes: Reflexes are normal and symmetric.  Psychiatric:        Behavior: Behavior normal.        Thought Content: Thought content normal.        Judgment: Judgment normal.     BP 115/76   Pulse 69   Temp 97.7 F (36.5 C) (Temporal)   Ht 5\' 8"  (1.727 m)   Wt 166 lb (75.3 kg)   SpO2 98%   BMI 25.24 kg/m        Assessment & Plan:  Thomas Pratt comes in today with chief complaint of Medical Management of Chronic Issues (Weak and bone pain all over )   Diagnosis and orders addressed:  1. Alcoholic cirrhosis of liver without ascites (HCC) - CMP14+EGFR  2. Depression, major, single episode, mild (HCC) - CMP14+EGFR  3. Type 2 diabetes mellitus with diabetic polyneuropathy, with long-term current use of insulin (HCC) - Bayer DCA Hb A1c Waived - CMP14+EGFR  4. Diabetic polyneuropathy associated with type 2 diabetes mellitus (HCC) - CMP14+EGFR  5. Gastroesophageal reflux disease, unspecified whether esophagitis present - CMP14+EGFR  6. History of ETOH abuse - CMP14+EGFR  7. Primary hypertension - CMP14+EGFR  8. Hyperlipidemia associated with type 2 diabetes mellitus (HCC) - CMP14+EGFR  9. Encounter for immunization - Flu vaccine trivalent PF, 6mos and older(Flulaval,Afluria,Fluarix,Fluzone) - CMP14+EGFR   Labs pending Continue current medications  Health Maintenance reviewed Diet and exercise  encouraged  Follow up plan: 3 months   Jannifer Rodney, FNP

## 2023-05-07 LAB — CMP14+EGFR
ALT: 24 [IU]/L (ref 0–44)
AST: 21 [IU]/L (ref 0–40)
Albumin: 4.4 g/dL (ref 3.9–4.9)
Alkaline Phosphatase: 57 [IU]/L (ref 44–121)
BUN/Creatinine Ratio: 13 (ref 10–24)
BUN: 12 mg/dL (ref 8–27)
Bilirubin Total: 0.4 mg/dL (ref 0.0–1.2)
CO2: 24 mmol/L (ref 20–29)
Calcium: 9.8 mg/dL (ref 8.6–10.2)
Chloride: 101 mmol/L (ref 96–106)
Creatinine, Ser: 0.92 mg/dL (ref 0.76–1.27)
Globulin, Total: 2.8 g/dL (ref 1.5–4.5)
Glucose: 153 mg/dL — ABNORMAL HIGH (ref 70–99)
Potassium: 4.5 mmol/L (ref 3.5–5.2)
Sodium: 139 mmol/L (ref 134–144)
Total Protein: 7.2 g/dL (ref 6.0–8.5)
eGFR: 93 mL/min/{1.73_m2} (ref >59)

## 2023-06-09 ENCOUNTER — Other Ambulatory Visit: Payer: Self-pay | Admitting: Family

## 2023-06-09 DIAGNOSIS — E1169 Type 2 diabetes mellitus with other specified complication: Secondary | ICD-10-CM

## 2023-06-09 DIAGNOSIS — E785 Hyperlipidemia, unspecified: Secondary | ICD-10-CM

## 2023-07-04 ENCOUNTER — Ambulatory Visit (INDEPENDENT_AMBULATORY_CARE_PROVIDER_SITE_OTHER): Payer: Medicare Other | Admitting: Family Medicine

## 2023-07-04 ENCOUNTER — Encounter: Payer: Self-pay | Admitting: Family Medicine

## 2023-07-04 VITALS — BP 99/62 | HR 66 | Temp 98.3°F | Ht 68.0 in | Wt 167.0 lb

## 2023-07-04 DIAGNOSIS — J069 Acute upper respiratory infection, unspecified: Secondary | ICD-10-CM

## 2023-07-04 NOTE — Patient Instructions (Signed)

## 2023-07-04 NOTE — Progress Notes (Signed)
Subjective:  Patient ID: Thomas Pratt, male    DOB: [birth year], 62 y.o.   MRN: [id number]  Patient Care Team: Junie Spencer, FNP as PCP - General (Family Medicine) Jena Gauss Gerrit Friends, MD as Consulting Physician (Gastroenterology) Danella Maiers, [facility] (Pharmacist) Delora Fuel, OD (Optometry)   Chief Complaint:  Nasal Congestion (Cold symptoms/Cough/Congestion/Sore throat/headache)  HPI: Thomas Pratt is a 62 y.o. male presenting on 07/04/2023 for Nasal Congestion (Cold symptoms/Cough/Congestion/Sore throat/headache)  States that cold started 4 days ago with sneezing, coughing, headaches. States that it is getting better throughout the day, but gets worse at night. States that on Tuesday he had a sore throat, but he is getting better. States that he was concerned because he is diabetic. Reports that his BG is running 150-180s, sometimes 200s. States that he is not taking anything OTC because he was previously an alcoholic and he does not want to relapse.   Relevant past medical, surgical, family, and social history reviewed and updated as indicated.  Allergies and medications reviewed and updated. Data reviewed: Chart in Epic.  Past Medical History:  Diagnosis Date   Anxiety    Cirrhosis (HCC)    likely ETOH use    Depression    Diabetes (HCC)    GERD (gastroesophageal reflux disease)    Heat stroke 2016/17   Hypertension     Past Surgical History:  Procedure Laterality Date   CHOLECYSTECTOMY N/A 09/01/2017   Procedure: LAPAROSCOPIC CHOLECYSTECTOMY;  Surgeon: Franky Macho, MD;  Location: [facility];  Service: General;  Laterality: N/A;   COLONOSCOPY WITH PROPOFOL N/A 03/06/2017   Dr. Jena Gauss: tubular adenoma removed, next TCS 5 years if health permits.    COLONOSCOPY WITH PROPOFOL N/A 04/23/2022   Procedure: COLONOSCOPY WITH PROPOFOL;  Surgeon: Lanelle Bal, DO;  [facility]: [facility];  Service: Endoscopy;  Laterality: N/A;  10:30am, asa 3    ESOPHAGOGASTRODUODENOSCOPY (EGD) WITH PROPOFOL N/A 03/06/2017   Dr. Jena Gauss: grade 1 esophageal varices, erosive esophagitis, portal gastropathy.    ESOPHAGOGASTRODUODENOSCOPY (EGD) WITH PROPOFOL N/A 05/03/2019   Procedure: ESOPHAGOGASTRODUODENOSCOPY (EGD) WITH PROPOFOL;  Surgeon: Corbin Ade, MD;  [facility];  Service: Endoscopy;  Laterality: N/A;  10:30am   ESOPHAGOGASTRODUODENOSCOPY (EGD) WITH PROPOFOL N/A 12/23/2019   Procedure: ESOPHAGOGASTRODUODENOSCOPY (EGD) WITH PROPOFOL Possible esophageal variceal banding.;  Surgeon: Corbin Ade, MD;  [facility];  Service: Endoscopy;  Laterality: N/A;   ESOPHAGOGASTRODUODENOSCOPY (EGD) WITH PROPOFOL N/A 04/23/2022   Procedure: ESOPHAGOGASTRODUODENOSCOPY (EGD) WITH PROPOFOL;  Surgeon: Lanelle Bal, DO;  [facility];  Service: Endoscopy;  Laterality: N/A;   HERNIA REPAIR Right    RIH   INGUINAL HERNIA REPAIR Right 07/11/2017   Procedure: HERNIA REPAIR INGUINAL ADULT WITH MESH;  Surgeon: Franky Macho, MD;  Location: [facility];  Service: General;  Laterality: Right;  patient knows to arrive at 7:30   IR TRANSCATHETER BX  10/14/2017   IR US GUIDE VASC ACCESS RIGHT  10/14/2017   IR VENOGRAM HEPATIC WO HEMODYNAMIC EVALUATION  10/14/2017   MALONEY DILATION N/A 05/03/2019   Procedure: Elease Hashimoto DILATION;  Surgeon: Corbin Ade, MD;  [facility];  Service: Endoscopy;  Laterality: N/A;   POLYPECTOMY  03/06/2017   Procedure: POLYPECTOMY;  Surgeon: Corbin Ade, MD;  [facility];  Service: Endoscopy;;  ascending colon;   POLYPECTOMY  04/23/2022   Procedure: POLYPECTOMY INTESTINAL;  Surgeon: Lanelle Bal, DO;  [facility];  Service: Endoscopy;;  Social History   Socioeconomic History   Marital status: Divorced    Spouse name: Not on file   Number of children: Not on file   Years of education: Not on file   Highest education level: Not on file  Occupational History    Occupation: unemployed    Comment: disability  Tobacco Use   Smoking status: Never   Smokeless tobacco: Never  Vaping Use   Vaping status: Never Used  Substance and Sexual Activity   Alcohol use: No    Comment: Quit September 2017, heavy alcohol abuse previously   Drug use: No   Sexual activity: Not Currently    Partners: Female  Other Topics Concern   Not on file  Social History Narrative   Lives in [place], [place] with his youngest daughter; Is a retired Education administrator. Is on disability for cirrhosis.    Drank alcohol for years, quit 05/07/16. Has been sober since then. Quit b/c of health/cirrhosis.   Divorced for over 15 years.   Dating sporadically.   Has 3 daughters and 1 son live locally.   Originally from [place], moved here b/c of wife.    Eats all foods.    Wear seatbelt.   Does not drive, does not have a license, lost due to DWI.    Brought by neighbor.    Does not go to church. Believes in God. Prays each night, per report.    Social Drivers of Corporate investment banker Strain: Low Risk  (11/20/2022)   Overall Financial Resource Strain (CARDIA)    Difficulty of Paying Living Expenses: Not hard at all  Food Insecurity: No Food Insecurity (11/20/2022)   Hunger Vital Sign    Worried About Running Out of Food in the Last Year: Never true    Ran Out of Food in the Last Year: Never true  Transportation Needs: No Transportation Needs (11/20/2022)   PRAPARE - Administrator, Civil Service (Medical): No    Lack of Transportation (Non-Medical): No  Physical Activity: Sufficiently Active (11/20/2022)   Exercise Vital Sign    Days of Exercise per Week: 5 days    Minutes of Exercise per Session: 30 min  Stress: No Stress Concern Present (11/20/2022)   [employer] of Occupational Health - Occupational Stress Questionnaire    Feeling of Stress : Not at all  Social Connections: Socially Isolated (11/20/2022)   Social Connection and Isolation Panel [NHANES]    Frequency of  Communication with Friends and Family: More than three times a week    Frequency of Social Gatherings with Friends and Family: More than three times a week    Attends Religious Services: Never    Database administrator or Organizations: No    Attends Banker Meetings: Never    Marital Status: Divorced  Catering manager Violence: Not At Risk (11/20/2022)   Humiliation, Afraid, Rape, and Kick questionnaire    Fear of Current or Ex-Partner: No    Emotionally Abused: No    Physically Abused: No    Sexually Abused: No    Outpatient Encounter Medications as of 07/04/2023  Medication Sig   Accu-Chek Softclix Lancets lancets Test BS 4 (four) times daily. Dx E11.69 (Patient taking differently: 1 each by Other route 4 (four) times daily as needed. Test BS 4 (four) times daily. Dx E11.69)   aspirin EC 81 MG tablet Take 1 tablet (81 mg total) by mouth daily. Swallow whole.   atorvastatin (LIPITOR) 10 MG  tablet Take 1 tablet by mouth once daily   Blood Glucose Monitoring Suppl (ACCU-CHEK GUIDE) w/Device KIT Test BS 4 (four) times daily. Dx E11.69   cetirizine (ZYRTEC) 10 MG tablet Take 1 tablet by mouth once daily   Continuous Blood Gluc Sensor (FREESTYLE LIBRE 2 SENSOR) MISC Use to test blood sugars 6x daily as directed   dexlansoprazole (DEXILANT) 60 MG capsule Take 1 capsule by mouth once daily   diclofenac Sodium (VOLTAREN) 1 % GEL Apply 2 g topically 4 (four) times daily.   escitalopram (LEXAPRO) 20 MG tablet Take 1 tablet (20 mg total) by mouth daily.   FARXIGA 10 MG TABS tablet TAKE 1 TABLET BY MOUTH ONCE DAILY BEFORE BREAKFAST   fluticasone (FLONASE) 50 MCG/ACT nasal spray Use 2 spray(s) in each nostril once daily (Patient taking differently: Place 2 sprays into both nostrils daily as needed for allergies.)   furosemide (LASIX) 20 MG tablet Take 1 tablet by mouth once daily   gabapentin (NEURONTIN) 800 MG tablet Take 1 tablet (800 mg total) by mouth 3 (three) times daily.   glucose  blood (ACCU-CHEK GUIDE) test strip Test BS 4 (four) times daily. Dx E11.69   Insulin Pen Needle (B-D UF III MINI PEN NEEDLES) 31G X 5 MM MISC USE WITH TRESIBA DAILY   metFORMIN (GLUCOPHAGE-XR) 750 MG 24 hr tablet TAKE 2 TABLETS BY MOUTH ONCE DAILY WITH BREAKFAST   nadolol (CORGARD) 40 MG tablet Take 1 tablet by mouth once daily   nystatin (MYCOSTATIN/NYSTOP) powder SMARTSIG:Packet(s) Topical 3 Times Daily   nystatin cream (MYCOSTATIN) Apply topically 2 (two) times daily.   rifaximin (XIFAXAN) 550 MG TABS tablet Take 1 tablet (550 mg total) by mouth 2 (two) times daily. (Patient taking differently: Take 550 mg by mouth daily.)   Semaglutide, 1 MG/DOSE, 4 MG/3ML SOPN Inject 1 mg as directed once a week.   spironolactone (ALDACTONE) 50 MG tablet Take 1 tablet by mouth once daily   TRESIBA FLEXTOUCH 100 UNIT/ML FlexTouch Pen INJECT 25 UNITS SUBCUTANEOUSLY AT BEDTIME   VITAMIN D PO Take 1 capsule by mouth daily.    No facility-administered encounter medications on file as of 07/04/2023.    No Known Allergies  Review of Systems  Objective:  BP 99/62   Pulse 66   Temp 98.3 F (36.8 C)   Ht 5\' 8"  (1.727 m)   Wt 167 lb (75.8 kg)   SpO2 96%   BMI 25.39 kg/m    Wt Readings from Last 3 Encounters:  07/04/23 167 lb (75.8 kg)  05/06/23 166 lb (75.3 kg)  04/18/23 162 lb 9.6 oz (73.8 kg)    Physical Exam Constitutional:      General: He is awake. He is not in acute distress.    Appearance: Normal appearance. He is well-developed and well-groomed. He is not ill-appearing, toxic-appearing or diaphoretic.  HENT:     Right Ear: Tympanic membrane, ear canal and external ear normal.     Left Ear: Tympanic membrane, ear canal and external ear normal.     Nose:     Right Sinus: No maxillary sinus tenderness or frontal sinus tenderness.     Left Sinus: No maxillary sinus tenderness or frontal sinus tenderness.     Mouth/Throat:     Lips: Pink. No lesions.     Mouth: Mucous membranes are moist.      Dentition: Abnormal dentition.     Tonsils: No tonsillar exudate or tonsillar abscesses.  Cardiovascular:     Rate and  Rhythm: Normal rate and regular rhythm.     Pulses: Normal pulses.          Radial pulses are 2+ on the right side and 2+ on the left side.       Posterior tibial pulses are 2+ on the right side and 2+ on the left side.     Heart sounds: Normal heart sounds. No murmur heard.    No gallop.  Pulmonary:     Effort: Pulmonary effort is normal. No respiratory distress.     Breath sounds: Normal breath sounds. No stridor, decreased air movement or transmitted upper airway sounds. No decreased breath sounds, wheezing, rhonchi or rales.  Musculoskeletal:     Cervical back: Full passive range of motion without pain and neck supple.     Right lower leg: No edema.     Left lower leg: No edema.  Lymphadenopathy:     Head:     Right side of head: No submental, submandibular, tonsillar, preauricular or posterior auricular adenopathy.     Left side of head: No submental, submandibular, tonsillar, preauricular or posterior auricular adenopathy.  Skin:    General: Skin is warm.     Capillary Refill: Capillary refill takes less than 2 seconds.  Neurological:     General: No focal deficit present.     Mental Status: He is alert, oriented to person, place, and time and easily aroused. Mental status is at baseline.     GCS: GCS eye subscore is 4. GCS verbal subscore is 5. GCS motor subscore is 6.     Motor: No weakness.  Psychiatric:        Attention and Perception: Attention and perception normal.        Mood and Affect: Mood and affect normal.        Speech: Speech normal.        Behavior: Behavior normal. Behavior is cooperative.        Thought Content: Thought content normal. Thought content does not include homicidal or suicidal ideation. Thought content does not include homicidal or suicidal plan.        Cognition and Memory: Cognition and memory normal.        Judgment:  Judgment normal.     Results for orders placed or performed in visit on 05/06/23  Bayer DCA Hb A1c Waived   Collection Time: 05/06/23  3:58 PM  Result Value Ref Range   HB A1C (BAYER DCA - WAIVED) 6.5 (H) 4.8 - 5.6 %  CMP14+EGFR   Collection Time: 05/06/23  4:03 PM  Result Value Ref Range   Glucose 153 (H) 70 - 99 mg/dL   BUN 12 8 - 27 mg/dL   Creatinine, Ser 1.61 0.76 - 1.27 mg/dL   eGFR 93 >09 UE/AVW/0.98   BUN/Creatinine Ratio 13 10 - 24   Sodium 139 134 - 144 mmol/L   Potassium 4.5 3.5 - 5.2 mmol/L   Chloride 101 96 - 106 mmol/L   CO2 24 20 - 29 mmol/L   Calcium 9.8 8.6 - 10.2 mg/dL   Total Protein 7.2 6.0 - 8.5 g/dL   Albumin 4.4 3.9 - 4.9 g/dL   Globulin, Total 2.8 1.5 - 4.5 g/dL   Bilirubin Total 0.4 0.0 - 1.2 mg/dL   Alkaline Phosphatase 57 44 - 121 IU/L   AST 21 0 - 40 IU/L   ALT 24 0 - 44 IU/L       07/04/2023    3:39 PM 02/03/2023  3:23 PM 11/26/2022   10:28 AM 11/20/2022   11:25 AM 10/31/2022   10:26 AM  Depression screen PHQ 2/9  Decreased Interest 1 2 2  0 1  Down, Depressed, Hopeless 1 1 2  0 2  PHQ - 2 Score 2 3 4  0 3  Altered sleeping 2 1 3  0 3  Tired, decreased energy 1 2 3  0 2  Change in appetite 2 2 2  0 0  Feeling bad or failure about yourself  0 1 0 0 1  Trouble concentrating 0 0 0 0 0  Moving slowly or fidgety/restless 0 1 0 0 0  Suicidal thoughts 0 0 0 0 0  PHQ-9 Score 7 10 12  0 9  Difficult doing work/chores Somewhat difficult Somewhat difficult Somewhat difficult Not difficult at all Somewhat difficult       07/04/2023    3:39 PM 02/03/2023    3:23 PM 11/26/2022   10:29 AM 01/24/2022   11:53 AM  GAD 7 : Generalized Anxiety Score  Nervous, Anxious, on Edge 1 0 0 1  Control/stop worrying 1 2 0 1  Worry too much - different things 1 1 0 1  Trouble relaxing 0 1 2 0  Restless 0 2 1 0  Easily annoyed or irritable 2 1 3  0  Afraid - awful might happen 1 1 1  0  Total GAD 7 Score 6 8 7 3   Anxiety Difficulty Somewhat difficult Somewhat difficult  Somewhat difficult Not difficult at all   Pertinent labs & imaging results that were available during my care of the patient were reviewed by me and considered in my medical decision making.  Assessment & Plan:  Thomas "Lennox Grumbles" was seen today for nasal congestion.  Diagnoses and all orders for this visit:  Viral URI with cough Discussed monitoring BG during illness. A1C is well controlled at last check. Encouraged patient to continue supportive home therapies. Discussed with patient OTC treatment that does not have alcohol.    Continue all other maintenance medications.  Follow up plan: Return if symptoms worsen or fail to improve.  Continue healthy lifestyle choices, including diet (rich in fruits, vegetables, and lean proteins, and low in salt and simple carbohydrates) and exercise (at least 30 minutes of moderate physical activity daily).  Written and verbal instructions provided   The above assessment and management plan was discussed with the patient. The patient verbalized understanding of and has agreed to the management plan. Patient is aware to call the clinic if they develop any new symptoms or if symptoms persist or worsen. Patient is aware when to return to the clinic for a follow-up visit. Patient educated on when it is appropriate to go to the emergency department.   Thomas Burly, DNP-FNP [facility] [address] [place], [place] [address] [phone]

## 2023-07-14 ENCOUNTER — Other Ambulatory Visit: Payer: Self-pay | Admitting: Gastroenterology

## 2023-07-14 DIAGNOSIS — F1011 Alcohol abuse, in remission: Secondary | ICD-10-CM

## 2023-07-14 DIAGNOSIS — K7460 Unspecified cirrhosis of liver: Secondary | ICD-10-CM

## 2023-07-14 DIAGNOSIS — K766 Portal hypertension: Secondary | ICD-10-CM

## 2023-07-14 DIAGNOSIS — I8510 Secondary esophageal varices without bleeding: Secondary | ICD-10-CM

## 2023-08-03 ENCOUNTER — Other Ambulatory Visit: Payer: Self-pay | Admitting: Family

## 2023-08-03 DIAGNOSIS — E1169 Type 2 diabetes mellitus with other specified complication: Secondary | ICD-10-CM

## 2023-08-03 DIAGNOSIS — F320 Major depressive disorder, single episode, mild: Secondary | ICD-10-CM

## 2023-08-08 ENCOUNTER — Ambulatory Visit: Payer: Medicare Other | Admitting: Family

## 2023-08-11 ENCOUNTER — Ambulatory Visit: Payer: Medicare Other | Admitting: Nurse Practitioner

## 2023-08-26 ENCOUNTER — Ambulatory Visit (INDEPENDENT_AMBULATORY_CARE_PROVIDER_SITE_OTHER): Payer: Medicare Other | Admitting: Family

## 2023-08-26 ENCOUNTER — Encounter: Payer: Self-pay | Admitting: Family

## 2023-08-26 VITALS — BP 128/74 | HR 80 | Temp 97.8°F | Wt 165.8 lb

## 2023-08-26 DIAGNOSIS — E1169 Type 2 diabetes mellitus with other specified complication: Secondary | ICD-10-CM

## 2023-08-26 DIAGNOSIS — F320 Major depressive disorder, single episode, mild: Secondary | ICD-10-CM

## 2023-08-26 DIAGNOSIS — I10 Essential (primary) hypertension: Secondary | ICD-10-CM

## 2023-08-26 DIAGNOSIS — K219 Gastro-esophageal reflux disease without esophagitis: Secondary | ICD-10-CM

## 2023-08-26 DIAGNOSIS — Z0001 Encounter for general adult medical examination with abnormal findings: Secondary | ICD-10-CM

## 2023-08-26 DIAGNOSIS — E785 Hyperlipidemia, unspecified: Secondary | ICD-10-CM

## 2023-08-26 DIAGNOSIS — F1011 Alcohol abuse, in remission: Secondary | ICD-10-CM

## 2023-08-26 DIAGNOSIS — E1142 Type 2 diabetes mellitus with diabetic polyneuropathy: Secondary | ICD-10-CM | POA: Diagnosis not present

## 2023-08-26 DIAGNOSIS — Z794 Long term (current) use of insulin: Secondary | ICD-10-CM | POA: Diagnosis not present

## 2023-08-26 DIAGNOSIS — K7460 Unspecified cirrhosis of liver: Secondary | ICD-10-CM

## 2023-08-26 DIAGNOSIS — K7030 Alcoholic cirrhosis of liver without ascites: Secondary | ICD-10-CM | POA: Diagnosis not present

## 2023-08-26 DIAGNOSIS — Z0000 Encounter for general adult medical examination without abnormal findings: Secondary | ICD-10-CM

## 2023-08-26 LAB — BAYER DCA HB A1C WAIVED: HB A1C (BAYER DCA - WAIVED): 7.3 % — ABNORMAL HIGH (ref 4.8–5.6)

## 2023-08-26 NOTE — Progress Notes (Signed)
 Subjective:    Patient ID: Thomas Pratt, male    DOB: 07-Dec-1960, 63 y.o.   MRN: [id number]  Chief Complaint  Patient presents with   Annual Exam    3 MONTH CHECK UP. PT STATES HE FEELS LIKE HE WORRIES A LOT MORE, HAVING HEADACHES. PT FELL ABOUT A WEEK AGO AND HIS ARMS HAVE HURT EVER SINCE   PT presents to the office today for CPE and chronic follow up. He is followed by our clinical pharmacists  for DM education.   He is followed by GI  for cirrhosis of liver. He has been sober since 03/2016. He had a colonoscopy and EGD on 04/23/22.   He is complaining of bilateral numbing pain in his foot and leg that is 9-10 out 10 when walking. Has diabetic neuropathy and takes gabapentin .   He is currently taking Ozempic  1 mg. Doing well and denies any nausea or vomiting.      08/26/2023   10:35 AM 07/04/2023    3:36 PM 05/06/2023    3:46 PM  Last 3 Weights  Weight (lbs) 165 lb 12.8 oz 167 lb 166 lb  Weight (kg) 75.206 kg 75.751 kg 75.297 kg     Hypertension This is a chronic problem. The current episode started more than 1 year ago. The problem has been resolved since onset. Associated symptoms include blurred vision and malaise/fatigue. Pertinent negatives include no peripheral edema, PND or shortness of breath. Risk factors for coronary artery disease include diabetes mellitus, dyslipidemia, male gender and sedentary lifestyle. The current treatment provides moderate improvement.  Gastroesophageal Reflux He complains of belching and heartburn. This is a chronic problem. The current episode started more than 1 year ago. The problem occurs rarely. Associated symptoms include fatigue. He has tried a PPI for the symptoms. The treatment provided moderate relief.  Hyperlipidemia This is a chronic problem. The current episode started more than 1 year ago. The problem is controlled. Recent lipid tests were reviewed and are normal. Pertinent negatives include no shortness of breath. Current  antihyperlipidemic treatment includes statins. The current treatment provides moderate improvement of lipids. Risk factors for coronary artery disease include dyslipidemia, diabetes mellitus, hypertension and a sedentary lifestyle.  Diabetes He presents for his follow-up diabetic visit. He has type 2 diabetes mellitus. Associated symptoms include blurred vision, fatigue and foot paresthesias. Diabetic complications include peripheral neuropathy. Risk factors for coronary artery disease include dyslipidemia, diabetes mellitus, male sex, hypertension and sedentary lifestyle. He is following a generally healthy diet. His overall blood glucose range is 140-180 mg/dl. Eye exam is current.  Depression        This is a chronic problem.  The current episode started more than 1 year ago.   Associated symptoms include fatigue and sad.  Associated symptoms include no helplessness and no hopelessness.  Past treatments include SSRIs - Selective serotonin reuptake inhibitors.     Review of Systems  Constitutional:  Positive for fatigue and malaise/fatigue.  Eyes:  Positive for blurred vision.  Respiratory:  Negative for shortness of breath.   Cardiovascular:  Negative for PND.  Gastrointestinal:  Positive for heartburn.  All other systems reviewed and are negative.  Family History  Problem Relation Age of Onset   COPD Mother    Liver disease Neg Hx    Colon cancer Neg Hx    Colon polyps Neg Hx    Social History   Socioeconomic History   Marital status: Divorced    Spouse name: Not on file  Number of children: Not on file   Years of education: Not on file   Highest education level: Not on file  Occupational History   Occupation: unemployed    Comment: disability  Tobacco Use   Smoking status: Never   Smokeless tobacco: Never  Vaping Use   Vaping status: Never Used  Substance and Sexual Activity   Alcohol use: No    Comment: Quit September 2017, heavy alcohol abuse previously   Drug use:  No   Sexual activity: Not Currently    Partners: Female  Other Topics Concern   Not on file  Social History Narrative   Lives in [place], [place] with his youngest daughter; Is a retired education administrator. Is on disability for cirrhosis.    Drank alcohol for years, quit 05/07/16. Has been sober since then. Quit b/c of health/cirrhosis.   Divorced for over 15 years.   Dating sporadically.   Has 3 daughters and 1 son live locally.   Originally from [place] , moved here b/c of wife.    Eats all foods.    Wear seatbelt.   Does not drive, does not have a license, lost due to DWI.    Brought by neighbor.    Does not go to church. Believes in God. Prays each night, per report.    Social Drivers of Corporate Investment Banker Strain: Low Risk  (11/20/2022)   Overall Financial Resource Strain (CARDIA)    Difficulty of Paying Living Expenses: Not hard at all  Food Insecurity: No Food Insecurity (11/20/2022)   Hunger Vital Sign    Worried About Running Out of Food in the Last Year: Never true    Ran Out of Food in the Last Year: Never true  Transportation Needs: No Transportation Needs (11/20/2022)   PRAPARE - Administrator, Civil Service (Medical): No    Lack of Transportation (Non-Medical): No  Physical Activity: Sufficiently Active (11/20/2022)   Exercise Vital Sign    Days of Exercise per Week: 5 days    Minutes of Exercise per Session: 30 min  Stress: No Stress Concern Present (11/20/2022)   [employer] of Occupational Health - Occupational Stress Questionnaire    Feeling of Stress : Not at all  Social Connections: Socially Isolated (11/20/2022)   Social Connection and Isolation Panel [NHANES]    Frequency of Communication with Friends and Family: More than three times a week    Frequency of Social Gatherings with Friends and Family: More than three times a week    Attends Religious Services: Never    Database Administrator or Organizations: No    Attends Banker Meetings:  Never    Marital Status: Divorced       Objective:   Physical Exam Vitals reviewed.  Constitutional:      General: He is not in acute distress.    Appearance: He is well-developed.  HENT:     Head: Normocephalic.     Right Ear: Tympanic membrane normal.     Left Ear: Tympanic membrane normal.  Eyes:     General:        Right eye: No discharge.        Left eye: No discharge.     Pupils: Pupils are equal, round, and reactive to light.  Neck:     Thyroid : No thyromegaly.  Cardiovascular:     Rate and Rhythm: Normal rate and regular rhythm.     Heart sounds: Normal heart sounds. No murmur  heard. Pulmonary:     Effort: Pulmonary effort is normal. No respiratory distress.     Breath sounds: Normal breath sounds. No wheezing.  Abdominal:     General: Bowel sounds are normal. There is no distension.     Palpations: Abdomen is soft.     Tenderness: There is no abdominal tenderness.  Musculoskeletal:        General: No tenderness. Normal range of motion.     Cervical back: Normal range of motion and neck supple.  Skin:    General: Skin is warm and dry.     Findings: No erythema or rash.  Neurological:     Mental Status: He is alert and oriented to person, place, and time.     Cranial Nerves: No cranial nerve deficit.     Deep Tendon Reflexes: Reflexes are normal and symmetric.  Psychiatric:        Behavior: Behavior normal.        Thought Content: Thought content normal.        Judgment: Judgment normal.     BP 128/74   Pulse 80   Temp 97.8 F (36.6 C)   Wt 165 lb 12.8 oz (75.2 kg)   SpO2 96%   BMI 25.21 kg/m        Assessment & Plan:  Yamil Oelke comes in today with chief complaint of Annual Exam (3 MONTH CHECK UP. PT STATES HE FEELS LIKE HE WORRIES A LOT MORE, HAVING HEADACHES. PT FELL ABOUT A WEEK AGO AND HIS ARMS HAVE HURT EVER SINCE)   Diagnosis and orders addressed:  1. Annual physical exam (Primary) - Bayer DCA Hb A1c Waived - CMP14+EGFR -  TSH - Lipid panel  2. Alcoholic cirrhosis of liver without ascites (HCC) - CMP14+EGFR  3. Cirrhosis of liver without ascites, unspecified hepatic cirrhosis type (HCC) - CMP14+EGFR  4. Depression, major, single episode, mild (HCC) - CMP14+EGFR - TSH  5. Type 2 diabetes mellitus with diabetic polyneuropathy, with long-term current use of insulin  (HCC) - Bayer DCA Hb A1c Waived - CMP14+EGFR  6. Gastroesophageal reflux disease, unspecified whether esophagitis present - CMP14+EGFR  7. History of ETOH abuse - CMP14+EGFR  8. Primary hypertension - CMP14+EGFR  9. Hyperlipidemia associated with type 2 diabetes mellitus (HCC) - CMP14+EGFR - Lipid panel  Labs pending Continue current medications  Health Maintenance reviewed Diet and exercise encouraged  Follow up plan: 4 months   Bari Learn, FNP

## 2023-08-26 NOTE — Patient Instructions (Signed)
 Health Maintenance, Male  Adopting a healthy lifestyle and getting preventive care are important in promoting health and wellness. Ask your health care provider about:  The right schedule for you to have regular tests and exams.  Things you can do on your own to prevent diseases and keep yourself healthy.  What should I know about diet, weight, and exercise?  Eat a healthy diet    Eat a diet that includes plenty of vegetables, fruits, low-fat dairy products, and lean protein.  Do not eat a lot of foods that are high in solid fats, added sugars, or sodium.  Maintain a healthy weight  Body mass index (BMI) is a measurement that can be used to identify possible weight problems. It estimates body fat based on height and weight. Your health care provider can help determine your BMI and help you achieve or maintain a healthy weight.  Get regular exercise  Get regular exercise. This is one of the most important things you can do for your health. Most adults should:  Exercise for at least 150 minutes each week. The exercise should increase your heart rate and make you sweat (moderate-intensity exercise).  Do strengthening exercises at least twice a week. This is in addition to the moderate-intensity exercise.  Spend less time sitting. Even light physical activity can be beneficial.  Watch cholesterol and blood lipids  Have your blood tested for lipids and cholesterol at 64 years of age, then have this test every 5 years.  You may need to have your cholesterol levels checked more often if:  Your lipid or cholesterol levels are high.  You are older than 64 years of age.  You are at high risk for heart disease.  What should I know about cancer screening?  Many types of cancers can be detected early and may often be prevented. Depending on your health history and family history, you may need to have cancer screening at various ages. This may include screening for:  Colorectal cancer.  Prostate cancer.  Skin cancer.  Lung  cancer.  What should I know about heart disease, diabetes, and high blood pressure?  Blood pressure and heart disease  High blood pressure causes heart disease and increases the risk of stroke. This is more likely to develop in people who have high blood pressure readings or are overweight.  Talk with your health care provider about your target blood pressure readings.  Have your blood pressure checked:  Every 3-5 years if you are 24-52 years of age.  Every year if you are 3 years old or older.  If you are between the ages of 60 and 72 and are a current or former smoker, ask your health care provider if you should have a one-time screening for abdominal aortic aneurysm (AAA).  Diabetes  Have regular diabetes screenings. This checks your fasting blood sugar level. Have the screening done:  Once every three years after age 66 if you are at a normal weight and have a low risk for diabetes.  More often and at a younger age if you are overweight or have a high risk for diabetes.  What should I know about preventing infection?  Hepatitis B  If you have a higher risk for hepatitis B, you should be screened for this virus. Talk with your health care provider to find out if you are at risk for hepatitis B infection.  Hepatitis C  Blood testing is recommended for:  Everyone born from [birth year] through 1966.  Anyone  with known risk factors for hepatitis C.  Sexually transmitted infections (STIs)  You should be screened each year for STIs, including gonorrhea and chlamydia, if:  You are sexually active and are younger than 64 years of age.  You are older than 64 years of age and your health care provider tells you that you are at risk for this type of infection.  Your sexual activity has changed since you were last screened, and you are at increased risk for chlamydia or gonorrhea. Ask your health care provider if you are at risk.  Ask your health care provider about whether you are at high risk for HIV. Your health care provider  may recommend a prescription medicine to help prevent HIV infection. If you choose to take medicine to prevent HIV, you should first get tested for HIV. You should then be tested every 3 months for as long as you are taking the medicine.  Follow these instructions at home:  Alcohol use  Do not drink alcohol if your health care provider tells you not to drink.  If you drink alcohol:  Limit how much you have to 0-2 drinks a day.  Know how much alcohol is in your drink. In the [place] one drink equals one 12 oz bottle of beer (355 mL), one 5 oz glass of wine (148 mL), or one 1 oz glass of hard liquor (44 mL).  Lifestyle  Do not use any products that contain nicotine or tobacco. These products include cigarettes, chewing tobacco, and vaping devices, such as e-cigarettes. If you need help quitting, ask your health care provider.  Do not use street drugs.  Do not share needles.  Ask your health care provider for help if you need support or information about quitting drugs.  General instructions  Schedule regular health, dental, and eye exams.  Stay current with your vaccines.  Tell your health care provider if:  You often feel depressed.  You have ever been abused or do not feel safe at home.  Summary  Adopting a healthy lifestyle and getting preventive care are important in promoting health and wellness.  Follow your health care provider's instructions about healthy diet, exercising, and getting tested or screened for diseases.  Follow your health care provider's instructions on monitoring your cholesterol and blood pressure.  This information is not intended to replace advice given to you by your health care provider. Make sure you discuss any questions you have with your health care provider.  Document Revised: 11/27/2020 Document Reviewed: 11/27/2020  Elsevier Patient Education  2024 [employer].

## 2023-08-27 LAB — CMP14+EGFR
ALT: 21 [IU]/L (ref 0–44)
AST: 22 [IU]/L (ref 0–40)
Albumin: 4.6 g/dL (ref 3.9–4.9)
Alkaline Phosphatase: 55 [IU]/L (ref 44–121)
BUN/Creatinine Ratio: 14 (ref 10–24)
BUN: 13 mg/dL (ref 8–27)
Bilirubin Total: 0.7 mg/dL (ref 0.0–1.2)
CO2: 24 mmol/L (ref 20–29)
Calcium: 9.8 mg/dL (ref 8.6–10.2)
Chloride: 100 mmol/L (ref 96–106)
Creatinine, Ser: 0.96 mg/dL (ref 0.76–1.27)
Globulin, Total: 2.7 g/dL (ref 1.5–4.5)
Glucose: 137 mg/dL — ABNORMAL HIGH (ref 70–99)
Potassium: 4.2 mmol/L (ref 3.5–5.2)
Sodium: 137 mmol/L (ref 134–144)
Total Protein: 7.3 g/dL (ref 6.0–8.5)
eGFR: 89 mL/min/{1.73_m2} (ref >59)

## 2023-08-27 LAB — LIPID PANEL
Chol/HDL Ratio: 3.9 {ratio} (ref 0.0–5.0)
Cholesterol, Total: 154 mg/dL (ref 100–199)
HDL: 39 mg/dL — ABNORMAL LOW (ref >39)
LDL Chol Calc (NIH): 95 mg/dL (ref 0–99)
Triglycerides: 111 mg/dL (ref 0–149)
VLDL Cholesterol Cal: 20 mg/dL (ref 5–40)

## 2023-08-27 LAB — TSH: TSH: 1.96 u[IU]/mL (ref 0.450–4.500)

## 2023-08-28 NOTE — Telephone Encounter (Unsigned)
 Copied from CRM [id number]. Topic: Clinical - Lab/Test Results >> Aug 28, 2023  3:56 PM Crispin Dolphin wrote: Reason for CRM: Patient returned called for results. Read note as written by provider. Patient understood. No further questions at this time.

## 2023-09-01 ENCOUNTER — Other Ambulatory Visit: Payer: Self-pay | Admitting: Family

## 2023-09-01 DIAGNOSIS — I10 Essential (primary) hypertension: Secondary | ICD-10-CM

## 2023-09-05 ENCOUNTER — Other Ambulatory Visit: Payer: Self-pay | Admitting: Family

## 2023-09-05 DIAGNOSIS — E1169 Type 2 diabetes mellitus with other specified complication: Secondary | ICD-10-CM

## 2023-09-10 ENCOUNTER — Other Ambulatory Visit: Payer: Self-pay | Admitting: Family

## 2023-09-10 DIAGNOSIS — E1169 Type 2 diabetes mellitus with other specified complication: Secondary | ICD-10-CM

## 2023-09-22 ENCOUNTER — Encounter: Payer: Self-pay | Admitting: *Deleted

## 2023-10-11 ENCOUNTER — Other Ambulatory Visit: Payer: Self-pay | Admitting: Family

## 2023-10-30 ENCOUNTER — Encounter: Payer: Self-pay | Admitting: Gastroenterology

## 2023-10-30 ENCOUNTER — Ambulatory Visit (INDEPENDENT_AMBULATORY_CARE_PROVIDER_SITE_OTHER): Payer: Medicare Other | Admitting: Gastroenterology

## 2023-10-30 VITALS — BP 122/75 | HR 54 | Temp 98.1°F | Ht 68.0 in | Wt 173.4 lb

## 2023-10-30 DIAGNOSIS — F1011 Alcohol abuse, in remission: Secondary | ICD-10-CM

## 2023-10-30 DIAGNOSIS — K219 Gastro-esophageal reflux disease without esophagitis: Secondary | ICD-10-CM

## 2023-10-30 DIAGNOSIS — K7460 Unspecified cirrhosis of liver: Secondary | ICD-10-CM | POA: Diagnosis not present

## 2023-10-30 DIAGNOSIS — I8510 Secondary esophageal varices without bleeding: Secondary | ICD-10-CM

## 2023-10-30 DIAGNOSIS — I10 Essential (primary) hypertension: Secondary | ICD-10-CM

## 2023-10-30 DIAGNOSIS — K766 Portal hypertension: Secondary | ICD-10-CM

## 2023-10-30 MED ORDER — SPIRONOLACTONE 50 MG PO TABS: 50.0000 mg | ORAL_TABLET | Freq: Every day | ORAL | 3 refills | Status: AC

## 2023-10-30 MED ORDER — FUROSEMIDE 20 MG PO TABS: 20.0000 mg | ORAL_TABLET | Freq: Every day | ORAL | 3 refills | Status: AC

## 2023-10-30 NOTE — Progress Notes (Signed)
 Gastroenterology Office Note     Primary Care Physician:  Yevette Hem, FNP  Primary Gastroenterologist: Dr. Riley Cheadle    Chief Complaint   Chief Complaint  Patient presents with   Follow-up     History of Present Illness   Thomas Pratt is a 63 y.o. male presenting today with a history of cirrhosis presumably due to prior alcohol abuse (sober since 2017) however, he had positive AMA, AMA, ASMA (on repeat negative), elevated IgG, liver biopsy with granulomatous hepatitis. Evaluated by [facility] liver transplant center.  Labs repeated and they reviewed liver biopsy slides and suspected cirrhosis secondary to alcohol use but did have hepatic sarcoidosis.  Pulmonary work-up negative for lung involvement.  Urso  was stopped by [facility] liver, esophageal varices on Nadolol , last seen by [facility] July 2024 with recs to follow locally for MELD and PETH. Only return to [facility] if decompensation (not rise in MELD).   Due to increased risk of osteopenia in patients with cirrhosis, VIt D level was checked at last visit and normal at 34. Will need to follow serially and pursue DEXA if abnormal. .   He is due for labs and US .   Long-term intermittent abdominal pain, chronic. Taking Xifaxan  once a day. Recommend twice a day. Lactulose  in the past but not on any longer. No overt GI bleeding, mental status changes, confusion, tense ascites, lower extremity edema. Continues to completely abstain from ETOH. Dexilant  once dailyLasix  controlling GERD symptoms. Continues on Nadolol  40 mg once daily. Lasix  20 mg and spironolactone  50 mg daily. Vit D supplement daily.    Colonoscopy 04/2022: -nonbleeding internal hemorrhoids -four 4-55mm polyps removed. -tubular adenomas -next colonoscopy 5 years   EGD 04/2022: -grade I esophageal varices -gastritis -small hh -repeat EGD in two years   Past Medical History:  Diagnosis Date   Anxiety    Cirrhosis (HCC)    likely ETOH use    Depression     Diabetes (HCC)    GERD (gastroesophageal reflux disease)    Heat stroke 2016/17   Hypertension     Past Surgical History:  Procedure Laterality Date   CHOLECYSTECTOMY N/A 09/01/2017   Procedure: LAPAROSCOPIC CHOLECYSTECTOMY;  Surgeon: Alanda Allegra, MD;  Location: [facility];  Service: General;  Laterality: N/A;   COLONOSCOPY WITH PROPOFOL  N/A 03/06/2017   Dr. Riley Cheadle: tubular adenoma removed, next TCS 5 years if health permits.    COLONOSCOPY WITH PROPOFOL  N/A 04/23/2022   Procedure: COLONOSCOPY WITH PROPOFOL ;  Surgeon: Vinetta Greening, DO;  Location: [facility];  Service: Endoscopy;  Laterality: N/A;  10:30am, asa 3   ESOPHAGOGASTRODUODENOSCOPY (EGD) WITH PROPOFOL  N/A 03/06/2017   Dr. Riley Cheadle: grade 1 esophageal varices, erosive esophagitis, portal gastropathy.    ESOPHAGOGASTRODUODENOSCOPY (EGD) WITH PROPOFOL  N/A 05/03/2019   Procedure: ESOPHAGOGASTRODUODENOSCOPY (EGD) WITH PROPOFOL ;  Surgeon: Suzette Espy, MD;  [facility];  Service: Endoscopy;  Laterality: N/A;  10:30am   ESOPHAGOGASTRODUODENOSCOPY (EGD) WITH PROPOFOL  N/A 12/23/2019   Procedure: ESOPHAGOGASTRODUODENOSCOPY (EGD) WITH PROPOFOL  Possible esophageal variceal banding.;  Surgeon: Suzette Espy, MD;  Location: [facility];  Service: Endoscopy;  Laterality: N/A;   ESOPHAGOGASTRODUODENOSCOPY (EGD) WITH PROPOFOL  N/A 04/23/2022   Procedure: ESOPHAGOGASTRODUODENOSCOPY (EGD) WITH PROPOFOL ;  Surgeon: Vinetta Greening, DO;  [facility];  Service: Endoscopy;  Laterality: N/A;   HERNIA REPAIR Right    RIH   INGUINAL HERNIA REPAIR Right 07/11/2017   Procedure: HERNIA REPAIR INGUINAL ADULT WITH MESH;  Surgeon: Alanda Allegra, MD;  Location: [facility];  Service: General;  Laterality: Right;  patient knows to arrive at 7:30   IR TRANSCATHETER BX  10/14/2017   IR US  GUIDE VASC ACCESS RIGHT  10/14/2017   IR VENOGRAM HEPATIC WO HEMODYNAMIC EVALUATION  10/14/2017   MALONEY DILATION N/A 05/03/2019   Procedure: Londa Rival  DILATION;  Surgeon: Suzette Espy, MD;  [facility];  Service: Endoscopy;  Laterality: N/A;   POLYPECTOMY  03/06/2017   Procedure: POLYPECTOMY;  Surgeon: Suzette Espy, MD;  [facility];  Service: Endoscopy;;  ascending colon;   POLYPECTOMY  04/23/2022   Procedure: POLYPECTOMY INTESTINAL;  Surgeon: Vinetta Greening, DO;  [facility];  Service: Endoscopy;;    Current Outpatient Medications  Medication Sig Dispense Refill   Accu-Chek Softclix Lancets lancets Test BS 4 (four) times daily. Dx E11.69 (Patient taking differently: 1 each by Other route 4 (four) times daily as needed. Test BS 4 (four) times daily. Dx E11.69) 400 each 3   aspirin  EC 81 MG tablet Take 1 tablet (81 mg total) by mouth daily. Swallow whole. 90 tablet 1   atorvastatin  (LIPITOR) 10 MG tablet Take 1 tablet by mouth once daily 90 tablet 1   Blood Glucose Monitoring Suppl (ACCU-CHEK GUIDE) w/Device KIT Test BS 4 (four) times daily. Dx E11.69 1 kit 0   cetirizine  (ZYRTEC ) 10 MG tablet Take 1 tablet by mouth once daily 90 tablet 3   Continuous Blood Gluc Sensor (FREESTYLE LIBRE 2 SENSOR) MISC Use to test blood sugars 6x daily as directed 2 each 3   dexlansoprazole  (DEXILANT ) 60 MG capsule Take 1 capsule by mouth once daily 90 capsule 0   diclofenac  Sodium (VOLTAREN ) 1 % GEL Apply 2 g topically 4 (four) times daily. 100 g 0   escitalopram  (LEXAPRO ) 20 MG tablet Take 1 tablet (20 mg total) by mouth daily. **NEEDS TO BE SEEN BEFORE NEXT REFILL** 30 tablet 0   FARXIGA  10 MG TABS tablet TAKE 1 TABLET BY MOUTH ONCE DAILY BEFORE BREAKFAST 90 tablet 0   fluticasone  (FLONASE ) 50 MCG/ACT nasal spray Use 2 spray(s) in each nostril once daily (Patient taking differently: Place 2 sprays into both nostrils daily as needed for allergies.) 16 g 5   gabapentin  (NEURONTIN ) 800 MG tablet Take 1 tablet (800 mg total) by mouth 3 (three) times daily. 270 tablet 1   glucose blood (ACCU-CHEK GUIDE) test strip Test BS  4 (four) times daily. Dx E11.69 400 each 3   Insulin  Pen Needle (B-D UF III MINI PEN NEEDLES) 31G X 5 MM MISC USE WITH TRESIBA  DAILY 100 each 3   metFORMIN  (GLUCOPHAGE -XR) 750 MG 24 hr tablet Take 2 tablets (1,500 mg total) by mouth daily with breakfast. TAKE 2 TABLETS BY MOUTH ONCE DAILY WITH BREAKFAST. 180 tablet 0   nadolol  (CORGARD ) 40 MG tablet Take 1 tablet by mouth once daily 90 tablet 3   nystatin  (MYCOSTATIN /NYSTOP ) powder APPLY  POWDER TOPICALLY TO AFFECTED AREA THREE TIMES DAILY 60 g 0   rifaximin  (XIFAXAN ) 550 MG TABS tablet Take 1 tablet (550 mg total) by mouth 2 (two) times daily. (Patient taking differently: Take 550 mg by mouth daily.) 180 tablet 3   Semaglutide , 1 MG/DOSE, 4 MG/3ML SOPN Inject 1 mg as directed once a week. 3 mL 2   TRESIBA  FLEXTOUCH 100 UNIT/ML FlexTouch Pen INJECT 25 UNITS SUBCUTANEOUSLY AT BEDTIME 15 mL 0   VITAMIN D  PO Take 1 capsule by mouth  daily.      furosemide  (LASIX ) 20 MG tablet Take 1 tablet (20 mg total) by mouth daily. 90 tablet 3   nystatin  cream (MYCOSTATIN ) APPLY  CREAM TOPICALLY TO AFFECTED AREA TWICE DAILY 60 g 3   spironolactone  (ALDACTONE ) 50 MG tablet Take 1 tablet (50 mg total) by mouth daily. 90 tablet 3   No current facility-administered medications for this visit.    Allergies as of 10/30/2023   (No Known Allergies)    Family History  Problem Relation Age of Onset   COPD Mother    Liver disease Neg Hx    Colon cancer Neg Hx    Colon polyps Neg Hx     Social History   Socioeconomic History   Marital status: Divorced    Spouse name: Not on file   Number of children: Not on file   Years of education: Not on file   Highest education level: Not on file  Occupational History   Occupation: unemployed    Comment: disability  Tobacco Use   Smoking status: Never   Smokeless tobacco: Never  Vaping Use   Vaping status: Never Used  Substance and Sexual Activity   Alcohol use: No    Comment: Quit September 2017, heavy alcohol  abuse previously   Drug use: No   Sexual activity: Not Currently    Partners: Female  Other Topics Concern   Not on file  Social History Narrative   Lives in [place], [place] with his youngest daughter; Is a retired Education administrator. Is on disability for cirrhosis.    Drank alcohol for years, quit 05/07/16. Has been sober since then. Quit b/c of health/cirrhosis.   Divorced for over 15 years.   Dating sporadically.   Has 3 daughters and 1 son live locally.   Originally from [place] , moved here b/c of wife.    Eats all foods.    Wear seatbelt.   Does not drive, does not have a license, lost due to DWI.    Brought by neighbor.    Does not go to church. Believes in God. Prays each night, per report.    Social Drivers of Corporate investment banker Strain: Low Risk  (11/20/2022)   Overall Financial Resource Strain (CARDIA)    Difficulty of Paying Living Expenses: Not hard at all  Food Insecurity: No Food Insecurity (11/20/2022)   Hunger Vital Sign    Worried About Running Out of Food in the Last Year: Never true    Ran Out of Food in the Last Year: Never true  Transportation Needs: No Transportation Needs (11/20/2022)   PRAPARE - Administrator, Civil Service (Medical): No    Lack of Transportation (Non-Medical): No  Physical Activity: Sufficiently Active (11/20/2022)   Exercise Vital Sign    Days of Exercise per Week: 5 days    Minutes of Exercise per Session: 30 min  Stress: No Stress Concern Present (11/20/2022)   [employer] of Occupational Health - Occupational Stress Questionnaire    Feeling of Stress : Not at all  Social Connections: Socially Isolated (11/20/2022)   Social Connection and Isolation Panel [NHANES]    Frequency of Communication with Friends and Family: More than three times a week    Frequency of Social Gatherings with Friends and Family: More than three times a week    Attends Religious Services: Never    Database administrator or Organizations: No    Attends Occupational hygienist Meetings: Never  Marital Status: Divorced  Catering manager Violence: Not At Risk (11/20/2022)   Humiliation, Afraid, Rape, and Kick questionnaire    Fear of Current or Ex-Partner: No    Emotionally Abused: No    Physically Abused: No    Sexually Abused: No     Review of Systems   Gen: Denies any fever, chills, fatigue, weight loss, lack of appetite.  CV: Denies chest pain, heart palpitations, peripheral edema, syncope.  Resp: Denies shortness of breath at rest or with exertion. Denies wheezing or cough.  GI: Denies dysphagia or odynophagia. Denies jaundice, hematemesis, fecal incontinence. GU : Denies urinary burning, urinary frequency, urinary hesitancy MS: Denies joint pain, muscle weakness, cramps, or limitation of movement.  Derm: Denies rash, itching, dry skin Psych: Denies depression, anxiety, memory loss, and confusion Heme: Denies bruising, bleeding, and enlarged lymph nodes.   Physical Exam   BP 122/75 (BP Location: Right Arm, Patient Position: Sitting, Cuff Size: Normal)   Pulse (!) 54   Temp 98.1 F (36.7 C) (Oral)   Ht 5\' 8"  (1.727 m)   Wt 173 lb 6.4 oz (78.7 kg)   SpO2 96%   BMI 26.37 kg/m  General:   Alert and oriented. Pleasant and cooperative. Well-nourished and well-developed.  Head:  Normocephalic and atraumatic. Eyes:  Without icterus Abdomen:  +BS, soft, non-tender and non-distended. No HSM noted. No guarding or rebound. No masses appreciated.  Rectal:  Deferred  Msk:  Symmetrical without gross deformities. Normal posture. Extremities:  Without edema. Neurologic:  Alert and  oriented x4;  negative asterixis  Skin:  Intact without significant lesions or rashes. Psych:  Alert and cooperative. Normal mood and affect.   Assessment   Thomas Pratt is a 63 y.o. male presenting today with a history of  cirrhosis presumably due to prior alcohol abuse (sober since 2017), returning for routine follow-up.  He has remained fairly well  compensated at this point. Previously has been seen by [facility] and recommended to follow here locally with only returning to [facility] if clinical decompensation (not rise in MELD). Will order US , routine labs including AFP. EGD in 2023 with small varices and is on Nadolol . Recommend increasing Xifaxan  to BID. No changes in diuretic therapy.      PLAN    US  in near future Labs including AFP Increase Xifaxan  to BID Continue lasix  20 mg and spironolactone  50 mg daily Continue Nadolol  for variceal bleeding prophylaxis  Will check Vit D at next visit: if abnormal, needs DEXA Continue low salt diet Return in 6 months   Delman Ferns, PhD, [facility] Gastroenterology

## 2023-10-30 NOTE — Patient Instructions (Addendum)
 We are arranging an ultrasound in the near future!  Please have blood work done when you are able.  I recommend taking Xifaxan twice a day.   So glad to see you again! We will see you in 6 months!  Continue to avoid adding salt to food, stay at no more than 2 grams per day. Avoid shellfish/raw oysters, as this can carry a bacteria that can cause you to be ill due to history of liver disease.  Have a great upcoming birthday!  I enjoyed seeing you again today! I value our relationship and want to provide genuine, compassionate, and quality care. You may receive a survey regarding your visit with me, and I welcome your feedback! Thanks so much for taking the time to complete this. I look forward to seeing you again.      Gelene Mink, PhD, ANP-BC [facility] Gastroenterology

## 2023-10-31 ENCOUNTER — Telehealth: Payer: Self-pay | Admitting: *Deleted

## 2023-10-31 NOTE — Telephone Encounter (Signed)
Called pt, no answer and not able to leave VM

## 2023-11-03 ENCOUNTER — Other Ambulatory Visit: Payer: Self-pay | Admitting: Family

## 2023-11-03 NOTE — Telephone Encounter (Signed)
 Spoke with pt and he is aware of his US  appt details.

## 2023-11-03 NOTE — Telephone Encounter (Signed)
 Called pt, no answer and not able to leave VM Called daughter and LMOVM to call back

## 2023-11-10 ENCOUNTER — Other Ambulatory Visit: Payer: Self-pay | Admitting: Family

## 2023-11-10 DIAGNOSIS — F1011 Alcohol abuse, in remission: Secondary | ICD-10-CM

## 2023-11-10 DIAGNOSIS — I8510 Secondary esophageal varices without bleeding: Secondary | ICD-10-CM

## 2023-11-10 DIAGNOSIS — K7460 Unspecified cirrhosis of liver: Secondary | ICD-10-CM

## 2023-11-10 DIAGNOSIS — K766 Portal hypertension: Secondary | ICD-10-CM

## 2023-11-10 DIAGNOSIS — E1169 Type 2 diabetes mellitus with other specified complication: Secondary | ICD-10-CM

## 2023-11-10 DIAGNOSIS — I10 Essential (primary) hypertension: Secondary | ICD-10-CM

## 2023-11-10 NOTE — Telephone Encounter (Signed)
 Copied from CRM [id number]. Topic: Clinical - Medication Refill >> Nov 10, 2023  1:37 PM Phil Braun wrote: Most Recent Primary Care Visit:   Medication:   metFORMIN  (GLUCOPHAGE -XR) 750 MG 24 hr tablet FARXIGA  10 MG TABS tablet fluticasone  (FLONASE ) 50 MCG/ACT nasal spray furosemide  (LASIX ) 20 MG tablet spironolactone  (ALDACTONE ) 50 MG tablet nystatin  cream (MYCOSTATIN ) nystatin  (MYCOSTATIN /NYSTOP ) powder   Has the patient contacted their pharmacy? Yes   Is this the correct pharmacy for this prescription? Yes If no, delete pharmacy and type the correct one.  This is the patient's preferred pharmacy:  [id number] - [place], [place] - [address] [place] [address] Phone: [phone] Fax: [phone]    Has the prescription been filled recently? Yes  Is the patient out of the medication? Yes  Has the patient been seen for an appointment in the last year OR does the patient have an upcoming appointment? Yes  Can we respond through MyChart? No  Agent: Please be advised that Rx refills may take up to 3 business days. We ask that you follow-up with your pharmacy.

## 2023-11-11 ENCOUNTER — Ambulatory Visit (HOSPITAL_COMMUNITY)
Admission: RE | Admit: 2023-11-11 | Discharge: 2023-11-11 | Disposition: A | Discharge status: Home / Self Care | Source: Ambulatory Visit | Attending: Gastroenterology | Admitting: Gastroenterology

## 2023-11-11 ENCOUNTER — Other Ambulatory Visit (HOSPITAL_COMMUNITY)
Admission: RE | Admit: 2023-11-11 | Discharge: 2023-11-11 | Disposition: A | Discharge status: Home / Self Care | Source: Ambulatory Visit | Attending: Gastroenterology | Admitting: Gastroenterology

## 2023-11-11 DIAGNOSIS — K7460 Unspecified cirrhosis of liver: Secondary | ICD-10-CM | POA: Diagnosis not present

## 2023-11-11 DIAGNOSIS — K7030 Alcoholic cirrhosis of liver without ascites: Secondary | ICD-10-CM | POA: Diagnosis not present

## 2023-11-11 DIAGNOSIS — R109 Unspecified abdominal pain: Secondary | ICD-10-CM | POA: Diagnosis not present

## 2023-11-11 DIAGNOSIS — G8929 Other chronic pain: Secondary | ICD-10-CM | POA: Insufficient documentation

## 2023-11-11 DIAGNOSIS — Z9049 Acquired absence of other specified parts of digestive tract: Secondary | ICD-10-CM | POA: Diagnosis not present

## 2023-11-11 LAB — PROTIME-INR
INR: 1 (ref 0.8–1.2)
Prothrombin Time: 13.5 s (ref 11.4–15.2)

## 2023-11-11 NOTE — Telephone Encounter (Signed)
 Last Fill: Metformin : 09/11/23     Farxiga : 01/27/23     Flonase : 06/20/21     Furosemide : 10/30/23     Aldactone : 10/30/23     Mycostatin : 11/03/23     Nystatin : 10/13/23  Last OV: 08/26/23 Next OV: 12/23/23  Routing to provider for review/authorization.

## 2023-11-12 LAB — AFP TUMOR MARKER: AFP, Serum, Tumor Marker: 2.2 ng/mL (ref 0.0–8.4)

## 2023-11-12 NOTE — Telephone Encounter (Signed)
 Pt called about refill requests and to see when they will be processed. He is out of medication.

## 2023-11-19 LAB — MISC LABCORP TEST (SEND OUT): Labcorp test code: 791584

## 2023-11-25 LAB — HM DIABETES EYE EXAM

## 2023-11-26 NOTE — Progress Notes (Signed)
 This encounter was created in error - please disregard.

## 2023-12-01 ENCOUNTER — Telehealth: Payer: Self-pay

## 2023-12-01 NOTE — Telephone Encounter (Signed)
 Returned the pt's call (from a vm) regarding some medications he stated you wrote on a piece of paper for him (to give to the pharmacy). I advised the pt that I didn't see in your notes where you had done that. Please advise regarding what the pt is stating you wrote down for him to take.

## 2023-12-02 NOTE — Telephone Encounter (Signed)
 I honestly am not sure what he means. I don't have anything extra to add from my note or labs.

## 2023-12-02 NOTE — Telephone Encounter (Signed)
Phoned the pt and his vm was full

## 2023-12-03 ENCOUNTER — Other Ambulatory Visit: Payer: Self-pay | Admitting: Family

## 2023-12-03 DIAGNOSIS — F320 Major depressive disorder, single episode, mild: Secondary | ICD-10-CM

## 2023-12-03 NOTE — Telephone Encounter (Signed)
 Phoned the pt. Mailbox is full

## 2023-12-05 NOTE — Telephone Encounter (Signed)
 Phoned the pt and vm still full. Will wait on pt to call back to the office

## 2023-12-09 ENCOUNTER — Other Ambulatory Visit: Payer: Self-pay | Admitting: Family

## 2023-12-09 NOTE — Telephone Encounter (Signed)
 LMOVM I called [facility] and this medication and several others are ready for pickup.

## 2023-12-09 NOTE — Telephone Encounter (Signed)
 Copied from CRM [id number]. Topic: Clinical - Medication Refill >> Dec 09, 2023 11:24 AM Alysia Jumbo S wrote: Medication: nystatin  cream (MYCOSTATIN )  Has the patient contacted their pharmacy? Yes, patient states he was told to contact provider to have med sent in.  (Agent: If no, request that the patient contact the pharmacy for the refill. If patient does not wish to contact the pharmacy document the reason why and proceed with request.) (Agent: If yes, when and what did the pharmacy advise?)  This is the patient's preferred pharmacy:  [id number] - [place], [place] - [address] [place] [address] Phone: [phone] Fax: [phone]    Is this the correct pharmacy for this prescription? No If no, delete pharmacy and type the correct one.   Has the prescription been filled recently? Yes  Is the patient out of the medication? Yes  Has the patient been seen for an appointment in the last year OR does the patient have an upcoming appointment? Yes  Can we respond through MyChart? Yes  Agent: Please be advised that Rx refills may take up to 3 business days. We ask that you follow-up with your pharmacy.

## 2023-12-23 ENCOUNTER — Ambulatory Visit: Admitting: Family

## 2023-12-24 ENCOUNTER — Encounter: Payer: Self-pay | Admitting: Family

## 2023-12-25 ENCOUNTER — Ambulatory Visit: Payer: Medicare Other | Admitting: Family

## 2024-01-01 ENCOUNTER — Encounter: Payer: Self-pay | Admitting: Family

## 2024-01-01 ENCOUNTER — Ambulatory Visit (INDEPENDENT_AMBULATORY_CARE_PROVIDER_SITE_OTHER): Admitting: Family

## 2024-01-01 VITALS — BP 121/78 | HR 55 | Temp 97.4°F | Ht 68.0 in | Wt 170.4 lb

## 2024-01-01 DIAGNOSIS — F320 Major depressive disorder, single episode, mild: Secondary | ICD-10-CM

## 2024-01-01 DIAGNOSIS — M79672 Pain in left foot: Secondary | ICD-10-CM | POA: Diagnosis not present

## 2024-01-01 DIAGNOSIS — E1142 Type 2 diabetes mellitus with diabetic polyneuropathy: Secondary | ICD-10-CM | POA: Diagnosis not present

## 2024-01-01 DIAGNOSIS — E785 Hyperlipidemia, unspecified: Secondary | ICD-10-CM

## 2024-01-01 DIAGNOSIS — R1012 Left upper quadrant pain: Secondary | ICD-10-CM

## 2024-01-01 DIAGNOSIS — K7460 Unspecified cirrhosis of liver: Secondary | ICD-10-CM | POA: Diagnosis not present

## 2024-01-01 DIAGNOSIS — E1169 Type 2 diabetes mellitus with other specified complication: Secondary | ICD-10-CM | POA: Diagnosis not present

## 2024-01-01 DIAGNOSIS — K219 Gastro-esophageal reflux disease without esophagitis: Secondary | ICD-10-CM

## 2024-01-01 DIAGNOSIS — I10 Essential (primary) hypertension: Secondary | ICD-10-CM | POA: Diagnosis not present

## 2024-01-01 DIAGNOSIS — Z794 Long term (current) use of insulin: Secondary | ICD-10-CM

## 2024-01-01 LAB — BAYER DCA HB A1C WAIVED: HB A1C (BAYER DCA - WAIVED): 8.8 % — ABNORMAL HIGH (ref 4.8–5.6)

## 2024-01-01 MED ORDER — LANCET DEVICE MISC
1.0000 | Freq: Three times a day (TID) | 0 refills | Status: AC
Start: 2024-01-01 — End: 2024-01-31

## 2024-01-01 MED ORDER — BLOOD GLUCOSE TEST VI STRP
1.0000 | ORAL_STRIP | Freq: Three times a day (TID) | 0 refills | Status: AC
Start: 2024-01-01 — End: 2024-01-31

## 2024-01-01 MED ORDER — BLOOD GLUCOSE MONITORING SUPPL DEVI
1.0000 | Freq: Three times a day (TID) | 0 refills | Status: AC
Start: 2024-01-01 — End: ?

## 2024-01-01 MED ORDER — LANCETS MISC. MISC
1.0000 | Freq: Three times a day (TID) | 0 refills | Status: AC
Start: 2024-01-01 — End: 2024-01-31

## 2024-01-01 MED ORDER — FLUTICASONE PROPIONATE 50 MCG/ACT NA SUSP: 2.0000 | Freq: Every day | NASAL | 3 refills | Status: AC | PRN

## 2024-01-01 NOTE — Progress Notes (Signed)
 Subjective:    Patient ID: Thomas Pratt, male    DOB: Aug 05, 1960, 63 y.o.   MRN: [id number]  Chief Complaint  Patient presents with   Medical Management of Chronic Issues    Hurting all over hard to get out a of bed. Patients left foot pain.    Abdominal Pain   PT presents to the office today for CPE and chronic follow up. He is followed by our clinical pharmacists  for DM education.   He is followed by GI  for cirrhosis of liver. He has been sober since 03/2016. He had a colonoscopy and EGD on 04/23/22.   He is complaining of bilateral numbing pain in his foot and leg that is 9-10 out 10 when walking. Has diabetic neuropathy and takes gabapentin .   He is currently taking Ozempic  1 mg. Doing well and denies any nausea or vomiting.      01/01/2024   10:49 AM 10/30/2023    3:07 PM 08/26/2023   10:35 AM  Last 3 Weights  Weight (lbs) 170 lb 6.4 oz 173 lb 6.4 oz 165 lb 12.8 oz  Weight (kg) 77.293 kg 78.654 kg 75.206 kg    Complaining of left foot pain in the ball of his foot. Denies any injury. Pain with walking.  Hypertension This is a chronic problem. The current episode started more than 1 year ago. The problem has been resolved since onset. The problem is controlled. Associated symptoms include blurred vision and malaise/fatigue (some times). Pertinent negatives include no peripheral edema, PND or shortness of breath. Risk factors for coronary artery disease include diabetes mellitus, dyslipidemia, male gender and sedentary lifestyle. The current treatment provides moderate improvement.  Gastroesophageal Reflux He complains of abdominal pain, belching and heartburn. This is a chronic problem. The current episode started more than 1 year ago. The problem occurs rarely. The symptoms are aggravated by certain foods. Associated symptoms include fatigue. He has tried a PPI for the symptoms. The treatment provided moderate relief.  Hyperlipidemia This is a chronic problem. The current  episode started more than 1 year ago. The problem is controlled. Recent lipid tests were reviewed and are normal. Pertinent negatives include no shortness of breath. Current antihyperlipidemic treatment includes statins. The current treatment provides moderate improvement of lipids. Risk factors for coronary artery disease include dyslipidemia, diabetes mellitus, hypertension and a sedentary lifestyle.  Diabetes He presents for his follow-up diabetic visit. He has type 2 diabetes mellitus. Associated symptoms include blurred vision, fatigue and foot paresthesias. Diabetic complications include peripheral neuropathy. Risk factors for coronary artery disease include dyslipidemia, diabetes mellitus, male sex, hypertension and sedentary lifestyle. He is following a generally healthy diet. His overall blood glucose range is 140-180 mg/dl. Eye exam is current.  Depression        This is a chronic problem.  The current episode started more than 1 year ago.   The onset quality is gradual.   Associated symptoms include fatigue, helplessness, hopelessness and sad.  Past treatments include SSRIs - Selective serotonin reuptake inhibitors. Abdominal Pain This is a new problem. The current episode started in the past 7 days. The problem occurs intermittently. The problem has been gradually improving. The pain is located in the LUQ. The pain is at a severity of 8/10. The quality of the pain is sharp. The abdominal pain does not radiate. Associated symptoms include belching. The pain is relieved by Being still. The treatment provided mild relief. His past medical history is significant for GERD.  Review of Systems  Constitutional:  Positive for fatigue and malaise/fatigue (some times).  Eyes:  Positive for blurred vision.  Respiratory:  Negative for shortness of breath.   Cardiovascular:  Negative for PND.  Gastrointestinal:  Positive for abdominal pain and heartburn.  All other systems reviewed and are  negative.  Family History  Problem Relation Age of Onset   COPD Mother    Liver disease Neg Hx    Colon cancer Neg Hx    Colon polyps Neg Hx    Social History   Socioeconomic History   Marital status: Divorced    Spouse name: Not on file   Number of children: Not on file   Years of education: Not on file   Highest education level: Not on file  Occupational History   Occupation: unemployed    Comment: disability  Tobacco Use   Smoking status: Never   Smokeless tobacco: Never  Vaping Use   Vaping status: Never Used  Substance and Sexual Activity   Alcohol use: No    Comment: Quit September 2017, heavy alcohol abuse previously   Drug use: No   Sexual activity: Not Currently    Partners: Female  Other Topics Concern   Not on file  Social History Narrative   Lives in [place], [place] with his youngest daughter; Is a retired Education administrator. Is on disability for cirrhosis.    Drank alcohol for years, quit 05/07/16. Has been sober since then. Quit b/c of health/cirrhosis.   Divorced for over 15 years.   Dating sporadically.   Has 3 daughters and 1 son live locally.   Originally from [place] , moved here b/c of wife.    Eats all foods.    Wear seatbelt.   Does not drive, does not have a license, lost due to DWI.    Brought by neighbor.    Does not go to church. Believes in God. Prays each night, per report.    Social Drivers of Corporate investment banker Strain: Low Risk  (11/20/2022)   Overall Financial Resource Strain (CARDIA)    Difficulty of Paying Living Expenses: Not hard at all  Food Insecurity: No Food Insecurity (11/20/2022)   Hunger Vital Sign    Worried About Running Out of Food in the Last Year: Never true    Ran Out of Food in the Last Year: Never true  Transportation Needs: No Transportation Needs (11/20/2022)   PRAPARE - Administrator, Civil Service (Medical): No    Lack of Transportation (Non-Medical): No  Physical Activity: Sufficiently Active (11/20/2022)    Exercise Vital Sign    Days of Exercise per Week: 5 days    Minutes of Exercise per Session: 30 min  Stress: No Stress Concern Present (11/20/2022)   [employer] of Occupational Health - Occupational Stress Questionnaire    Feeling of Stress : Not at all  Social Connections: Socially Isolated (11/20/2022)   Social Connection and Isolation Panel    Frequency of Communication with Friends and Family: More than three times a week    Frequency of Social Gatherings with Friends and Family: More than three times a week    Attends Religious Services: Never    Database administrator or Organizations: No    Attends Banker Meetings: Never    Marital Status: Divorced       Objective:   Physical Exam Vitals reviewed.  Constitutional:      General: He is not in acute distress.  Appearance: He is well-developed.  HENT:     Head: Normocephalic.     Right Ear: Tympanic membrane normal.     Left Ear: Tympanic membrane normal.   Eyes:     General:        Right eye: No discharge.        Left eye: No discharge.     Pupils: Pupils are equal, round, and reactive to light.   Neck:     Thyroid : No thyromegaly.   Cardiovascular:     Rate and Rhythm: Normal rate and regular rhythm.     Heart sounds: Normal heart sounds. No murmur heard. Pulmonary:     Effort: Pulmonary effort is normal. No respiratory distress.     Breath sounds: Normal breath sounds. No wheezing.  Abdominal:     General: Bowel sounds are normal. There is no distension.     Palpations: Abdomen is soft.     Tenderness: There is no abdominal tenderness (no tenderness noted).   Musculoskeletal:        General: No tenderness. Normal range of motion.     Cervical back: Normal range of motion and neck supple.       Feet:  Feet:     Comments: Pain in left ball with palpation  Skin:    General: Skin is warm and dry.     Findings: No erythema or rash.   Neurological:     Mental Status: He is alert and  oriented to person, place, and time.     Cranial Nerves: No cranial nerve deficit.     Deep Tendon Reflexes: Reflexes are normal and symmetric.   Psychiatric:        Behavior: Behavior normal.        Thought Content: Thought content normal.        Judgment: Judgment normal.     BP 121/78   Pulse (!) 55   Temp (!) 97.4 F (36.3 C) (Temporal)   Ht 5' 8 (1.727 m)   Wt 170 lb 6.4 oz (77.3 kg)   SpO2 96%   BMI 25.91 kg/m        Assessment & Plan:  Thomas Pratt comes in today with chief complaint of Medical Management of Chronic Issues (Hurting all over hard to get out a of bed. Patients left foot pain. ) and Abdominal Pain   Diagnosis and orders addressed:  1. Hyperlipidemia associated with type 2 diabetes mellitus (HCC) - CMP14+EGFR - CBC with Differential/Platelet  2. Cirrhosis of liver without ascites, unspecified hepatic cirrhosis type (HCC) - CMP14+EGFR - CBC with Differential/Platelet  3. Primary hypertension - CMP14+EGFR - CBC with Differential/Platelet  4. Depression, major, single episode, mild (HCC) - CMP14+EGFR - CBC with Differential/Platelet  5. Gastroesophageal reflux disease, unspecified whether esophagitis present - CMP14+EGFR - CBC with Differential/Platelet  6. Type 2 diabetes mellitus with diabetic polyneuropathy, with long-term current use of insulin  (HCC) (Primary) - Bayer DCA Hb A1c Waived - CMP14+EGFR - CBC with Differential/Platelet - Blood Glucose Monitoring Suppl DEVI; 1 each by Does not apply route in the morning, at noon, and at bedtime. May substitute to any manufacturer covered by patient's insurance.  Dispense: 1 each; Refill: 0 - Glucose Blood (BLOOD GLUCOSE TEST STRIPS) STRP; 1 each by In Vitro route in the morning, at noon, and at bedtime. May substitute to any manufacturer covered by patient's insurance.  Dispense: 100 strip; Refill: 0 - Lancet Device MISC; 1 each by Does not apply route in the morning,  at noon, and at  bedtime. May substitute to any manufacturer covered by patient's insurance.  Dispense: 1 each; Refill: 0 - Lancets Misc. MISC; 1 each by Does not apply route in the morning, at noon, and at bedtime. May substitute to any manufacturer covered by patient's insurance.  Dispense: 100 each; Refill: 0  7. Diabetic polyneuropathy associated with type 2 diabetes mellitus (HCC) - CMP14+EGFR - CBC with Differential/Platelet  8. Left foot pain - CMP14+EGFR - CBC with Differential/Platelet - DG Foot Complete Left; Future  9. Left upper quadrant abdominal pain - CBC with Differential/Platelet   Labs pending Continue current medications  Health Maintenance reviewed Diet and exercise encouraged  Follow up plan: 4 months   Tommas Fragmin, FNP

## 2024-01-01 NOTE — Patient Instructions (Signed)
 Plantar Fasciitis: What to Know  Your plantar fascia is a band of thick tissue on the bottom of your foot. It connects your heel bone to the base of your toes. If the fascia gets irritated, it can cause pain in your heel or foot. This is called plantar fasciitis. In some cases, plantar fasciitis can make it hard for you to walk or move. The pain is often worse in the morning after sleeping, or after sitting or lying down for a long time. Pain may also be worse after walking or standing for a long time. What are the causes? Plantar fasciitis may be caused by: Standing for a long time. Wearing shoes that don't have good arch support. Doing high-impact activities. These are things that put stress on your joints. They include: Ballet. Aerobic exercises. These are exercises that make your heart beat faster. Being overweight. Having a way of walking, or gait, that isn't normal. Tight muscles in your calf, which is in the back of your lower leg. High arches in your feet, or flat feet. Starting a new sport or activity. What are the signs or symptoms? The main symptom of plantar fasciitis is heel pain. Your pain may get worse after: You take your first steps after a time of rest. This includes in the morning after you wake up, or after you've been sitting or lying down for a while. Standing still for a long time. Pain may lessen after 30-45 minutes of activity, such as gentle walking. How is this diagnosed? Plantar fasciitis may be diagnosed based on your medical history, your symptoms, and an exam. Your health care provider will check for: A tender spot on the bottom of your foot. A high arch in your foot, or flat feet. Pain when you move your foot. Trouble moving your foot. You may also have tests. These may include: X-rays. Ultrasound. MRI. How is this treated? Treatment depends on how bad your plantar fasciitis is. It may include: RICE therapy. This stands for rest, ice, pressure  (compression), and raising (elevating) the foot. Exercises to stretch your calves and plantar fascia. A night splint. This holds your foot in a stretched, upward position while you sleep. Physical therapy. This can help with symptoms. It can also prevent problems in the future. Shots of a steroid medicine called cortisone. This can help with pain and irritation. Extracorporeal shock wave therapy. This uses electric shocks to stimulate your plantar fascia. If other treatments don't help, you may need to have surgery. Follow these instructions at home: Managing pain, stiffness, and swelling  Use ice, an ice pack, or a frozen bottle of water as told. Place a towel between your skin and the ice. Roll the bottom of your foot over the ice or frozen bottle. Do this for 20 minutes, 2-3 times a day. If your skin turns red, take off the ice right away to prevent skin damage. The risk of damage is higher if you can't feel pain, heat, or cold. Wear shoes that have air-sole or gel-sole cushions. You could also try soft shoe inserts made for plantar fasciitis. Activity Try not to do things that cause pain. Ask what things are safe for you to do. Exercise as told. Try activities that are low impact. This means that they're easier on your joints. They include: Swimming. Water aerobics. Biking. General instructions Take your medicines only as told. Wear a night splint as told. Loosen the splint if your toes tingle, are numb, or turn cold and blue.  Stay at a healthy weight. Work with your provider to lose weight as needed. Contact a health care provider if: Your symptoms don't go away with treatment. You have pain that gets worse. Your pain makes it hard to move or do everyday things. This information is not intended to replace advice given to you by your health care provider. Make sure you discuss any questions you have with your health care provider. Document Revised: 12/09/2022 Document Reviewed:  12/09/2022 Elsevier Patient Education  2024 [employer].

## 2024-01-02 ENCOUNTER — Other Ambulatory Visit: Payer: Self-pay | Admitting: Family

## 2024-01-02 ENCOUNTER — Ambulatory Visit: Payer: Self-pay | Admitting: Family

## 2024-01-02 LAB — CBC WITH DIFFERENTIAL/PLATELET
Basophils Absolute: 0 10*3/uL (ref 0.0–0.2)
Basos: 1 %
EOS (ABSOLUTE): 0.1 10*3/uL (ref 0.0–0.4)
Eos: 3 %
Hematocrit: 48.5 % (ref 37.5–51.0)
Hemoglobin: 16.1 g/dL (ref 13.0–17.7)
Immature Grans (Abs): 0 10*3/uL (ref 0.0–0.1)
Immature Granulocytes: 0 %
Lymphocytes Absolute: 2.4 10*3/uL (ref 0.7–3.1)
Lymphs: 42 %
MCH: 32.5 pg (ref 26.6–33.0)
MCHC: 33.2 g/dL (ref 31.5–35.7)
MCV: 98 fL — ABNORMAL HIGH (ref 79–97)
Monocytes Absolute: 0.4 10*3/uL (ref 0.1–0.9)
Monocytes: 6 %
Neutrophils Absolute: 2.7 10*3/uL (ref 1.4–7.0)
Neutrophils: 48 %
Platelets: 184 10*3/uL (ref 150–450)
RBC: 4.96 x10E6/uL (ref 4.14–5.80)
RDW: 13.2 % (ref 11.6–15.4)
WBC: 5.6 10*3/uL (ref 3.4–10.8)

## 2024-01-02 LAB — CMP14+EGFR
ALT: 18 IU/L (ref 0–44)
AST: 26 IU/L (ref 0–40)
Albumin: 4.6 g/dL (ref 3.9–4.9)
Alkaline Phosphatase: 58 IU/L (ref 44–121)
BUN/Creatinine Ratio: 15 (ref 10–24)
BUN: 15 mg/dL (ref 8–27)
Bilirubin Total: 0.8 mg/dL (ref 0.0–1.2)
CO2: 22 mmol/L (ref 20–29)
Calcium: 10.1 mg/dL (ref 8.6–10.2)
Chloride: 96 mmol/L (ref 96–106)
Creatinine, Ser: 1.03 mg/dL (ref 0.76–1.27)
Globulin, Total: 2.9 g/dL (ref 1.5–4.5)
Glucose: 190 mg/dL — ABNORMAL HIGH (ref 70–99)
Potassium: 4.4 mmol/L (ref 3.5–5.2)
Sodium: 136 mmol/L (ref 134–144)
Total Protein: 7.5 g/dL (ref 6.0–8.5)
eGFR: 81 mL/min/{1.73_m2} (ref >59)

## 2024-01-02 MED ORDER — OZEMPIC (2 MG/DOSE) 8 MG/3ML ~~LOC~~ SOPN: 2.0000 mg | PEN_INJECTOR | SUBCUTANEOUS | 2 refills | Status: DC

## 2024-01-05 ENCOUNTER — Other Ambulatory Visit: Payer: Self-pay | Admitting: Family

## 2024-01-05 ENCOUNTER — Other Ambulatory Visit

## 2024-01-05 ENCOUNTER — Telehealth: Payer: Self-pay | Admitting: Family Medicine

## 2024-01-05 DIAGNOSIS — F320 Major depressive disorder, single episode, mild: Secondary | ICD-10-CM

## 2024-01-05 NOTE — Telephone Encounter (Signed)
 Copied from CRM [id number]. Topic: Appointments - Scheduling Inquiry for Clinic >> Jan 02, 2024 12:21 PM Thomas Pratt wrote: Reason for CRM: Patient wants to schedule an appointment for x-rays done. Callback number is [phone] >> Jan 02, 2024  5:04 PM DeAngela L wrote: patient called back about scheduling appt for foot xray appointment, and patient needs notice so he can schedule  transportation for the 3 days before the appt  >> Jan 02, 2024 12:35 PM Mitzi M wrote: Tried to call patient to make Xray appt and no answer.

## 2024-01-05 NOTE — Telephone Encounter (Signed)
 Lmtcb to schedule XRAY apt

## 2024-01-06 ENCOUNTER — Other Ambulatory Visit: Payer: Self-pay | Admitting: Family

## 2024-01-06 ENCOUNTER — Telehealth: Payer: Self-pay

## 2024-01-06 NOTE — Telephone Encounter (Signed)
 NA. Call goes directly to VM. If pt calls back. Please transfer call to office to schedule xray apt.

## 2024-01-06 NOTE — Telephone Encounter (Signed)
 Copied from CRM [id number]. Topic: General - Other >> Jan 05, 2024  2:00 PM Carlatta H wrote: Reason for CRM: Please call to patient back//He received a call to schedule xray// >> Jan 06, 2024 11:32 AM Emylou G wrote: Pls call patient back.. number on file is good.. this for an xray?

## 2024-01-07 ENCOUNTER — Telehealth: Payer: Self-pay | Admitting: *Deleted

## 2024-01-07 NOTE — Telephone Encounter (Signed)
 Called and notified patient of hours that x-ray is here and advised him that he could come in any day this week before 5 or 4 on Friday.

## 2024-01-07 NOTE — Telephone Encounter (Signed)
 Copied from CRM [id number]. Topic: General - Other >> Jan 05, 2024  2:00 PM Carlatta H wrote: Reason for CRM: Please call to patient back//He received a call to schedule xray//

## 2024-01-08 ENCOUNTER — Other Ambulatory Visit: Payer: Self-pay | Admitting: Family

## 2024-01-08 DIAGNOSIS — K219 Gastro-esophageal reflux disease without esophagitis: Secondary | ICD-10-CM

## 2024-01-08 DIAGNOSIS — E1142 Type 2 diabetes mellitus with diabetic polyneuropathy: Secondary | ICD-10-CM

## 2024-01-12 ENCOUNTER — Ambulatory Visit (INDEPENDENT_AMBULATORY_CARE_PROVIDER_SITE_OTHER)

## 2024-01-12 DIAGNOSIS — M79672 Pain in left foot: Secondary | ICD-10-CM | POA: Diagnosis not present

## 2024-01-12 DIAGNOSIS — M19072 Primary osteoarthritis, left ankle and foot: Secondary | ICD-10-CM | POA: Diagnosis not present

## 2024-01-13 ENCOUNTER — Telehealth: Payer: Self-pay

## 2024-01-13 NOTE — Progress Notes (Signed)
 Care Guide Pharmacy Note  01/13/2024 Name: Thomas Pratt MRN: [id number] DOB: 1961-07-03  Referred By: Lavell Bari LABOR, FNP Reason for referral: Complex Care Management (Outreach to schedule with Pharm d for DM )   Thomas Pratt is a 63 y.o. year old male who is a primary care patient of Lavell Bari LABOR, FNP.  Thomas Pratt was referred to the pharmacist for assistance related to: DMII  Successful contact was made with the patient to discuss pharmacy services including being ready for the pharmacist to call at least 5 minutes before the scheduled appointment time and to have medication bottles and any blood pressure readings ready for review. The patient agreed to meet with the pharmacist via telephone visit on (date/time).02/05/2024  Thomas Pratt , RMA     [place]  [facility], [facility] Guide  Direct Dial: [phone]  Website: [email or web address]

## 2024-01-13 NOTE — Progress Notes (Signed)
 Care Guide Pharmacy Note  01/13/2024 Name: Thomas Pratt MRN: [id number] DOB: 1961-06-16  Referred By: Lavell Bari LABOR, FNP Reason for referral: Complex Care Management (Outreach to schedule with Pharm d for DM )   Thomas Pratt is a 63 y.o. year old male who is a primary care patient of Lavell Bari LABOR, FNP.  Thomas Pratt was referred to the pharmacist for assistance related to: DMII  An unsuccessful telephone outreach was attempted today to contact the patient who was referred to the pharmacy team for assistance with medication management. Additional attempts will be made to contact the patient.  Thomas Pratt , RMA     [facility]  [facility], [facility] Guide  Direct Dial: [phone]  Website: [email or web address]

## 2024-01-19 ENCOUNTER — Other Ambulatory Visit: Payer: Self-pay | Admitting: Family

## 2024-01-19 DIAGNOSIS — F320 Major depressive disorder, single episode, mild: Secondary | ICD-10-CM

## 2024-01-20 ENCOUNTER — Telehealth: Payer: Self-pay | Admitting: Family Medicine

## 2024-01-20 NOTE — Telephone Encounter (Signed)
 Copied from CRM [id number]. Topic: Clinical - Request for Lab/Test Order >> Jan 20, 2024 10:57 AM Turkey B wrote: Reason for CRM: pt called in for xray results of his foot. Please cb

## 2024-01-20 NOTE — Telephone Encounter (Signed)
 Refer to results

## 2024-02-05 ENCOUNTER — Other Ambulatory Visit

## 2024-02-05 NOTE — Telephone Encounter (Unsigned)
 Copied from CRM [id number]. Topic: Appointments - Scheduling Inquiry for Clinic >> Feb 05, 2024  1:27 PM Tobias L wrote: Reason for CRM: Patient missed telephone appointment with pharmacist. Requesting call back to reschedule, [phone]

## 2024-02-25 ENCOUNTER — Telehealth: Payer: Self-pay

## 2024-02-25 NOTE — Progress Notes (Signed)
 Complex Care Management Care Guide Note  02/25/2024 Name: Jermond Burkemper MRN: [id number] DOB: 10/19/1960  Glenmore Karl is a 63 y.o. year old male who is a primary care patient of Lavell Bari LABOR, FNP and is actively engaged with the care management team. I reached out to [name] by phone today to assist with re-scheduling  with the Pharmacist.  Follow up plan: Unsuccessful telephone outreach attempt made. A HIPAA compliant phone message was left for the patient providing contact information and requesting a return call.  [name] , RMA     [facility]  [facility], [facility] Guide  Direct Dial: [phone]  Website: [email or web address]

## 2024-03-03 ENCOUNTER — Other Ambulatory Visit: Payer: Self-pay | Admitting: Family

## 2024-03-03 DIAGNOSIS — E1169 Type 2 diabetes mellitus with other specified complication: Secondary | ICD-10-CM

## 2024-03-09 NOTE — Progress Notes (Signed)
 Complex Care Management Care Guide Note  03/09/2024 Name: Thomas Pratt MRN: [id number] DOB: 09-28-1960  Thomas Pratt is a 62 y.o. year old male who is a primary care patient of Lavell Bari LABOR, FNP and is actively engaged with the care management team. I reached out to [name] by phone today to assist with re-scheduling  with the Pharmacist.  Follow up plan: Unsuccessful telephone outreach attempt made. A HIPAA compliant phone message was left for the patient providing contact information and requesting a return call.  [name] , RMA     [facility]  [facility], [facility] Guide  Direct Dial: [phone]  Website: [email or web address]

## 2024-03-16 ENCOUNTER — Other Ambulatory Visit: Payer: Self-pay | Admitting: Family

## 2024-03-25 ENCOUNTER — Encounter: Payer: Self-pay | Admitting: Family

## 2024-03-25 ENCOUNTER — Ambulatory Visit (INDEPENDENT_AMBULATORY_CARE_PROVIDER_SITE_OTHER): Admitting: Family

## 2024-03-25 VITALS — BP 119/65 | HR 64 | Temp 97.9°F | Ht 68.0 in | Wt 171.0 lb

## 2024-03-25 DIAGNOSIS — E1142 Type 2 diabetes mellitus with diabetic polyneuropathy: Secondary | ICD-10-CM

## 2024-03-25 DIAGNOSIS — Z794 Long term (current) use of insulin: Secondary | ICD-10-CM | POA: Diagnosis not present

## 2024-03-25 DIAGNOSIS — R110 Nausea: Secondary | ICD-10-CM

## 2024-03-25 DIAGNOSIS — R5383 Other fatigue: Secondary | ICD-10-CM | POA: Diagnosis not present

## 2024-03-25 DIAGNOSIS — R519 Headache, unspecified: Secondary | ICD-10-CM | POA: Diagnosis not present

## 2024-03-25 DIAGNOSIS — M791 Myalgia, unspecified site: Secondary | ICD-10-CM | POA: Diagnosis not present

## 2024-03-25 LAB — BAYER DCA HB A1C WAIVED: HB A1C (BAYER DCA - WAIVED): 8.9 % — ABNORMAL HIGH (ref 4.8–5.6)

## 2024-03-25 LAB — URINALYSIS, COMPLETE
Bilirubin, UA: NEGATIVE
Leukocytes,UA: NEGATIVE
Nitrite, UA: NEGATIVE
Protein,UA: NEGATIVE
RBC, UA: NEGATIVE
Specific Gravity, UA: 1.015 (ref 1.005–1.030)
Urobilinogen, Ur: 1 mg/dL (ref 0.2–1.0)
pH, UA: 6.5 (ref 5.0–7.5)

## 2024-03-25 LAB — MICROSCOPIC EXAMINATION
Bacteria, UA: NONE SEEN
Epithelial Cells (non renal): NONE SEEN /HPF (ref 0–10)
RBC, Urine: NONE SEEN /HPF (ref 0–2)
Renal Epithel, UA: NONE SEEN /HPF
WBC, UA: NONE SEEN /HPF (ref 0–5)
Yeast, UA: NONE SEEN

## 2024-03-25 MED ORDER — DEXCOM G7 RECEIVER DEVI: 1.0000 | Freq: Three times a day (TID) | 3 refills | Status: AC | PRN

## 2024-03-25 MED ORDER — DEXCOM G7 SENSOR MISC: 1.0000 | 2 refills | Status: AC

## 2024-03-25 NOTE — Patient Instructions (Signed)

## 2024-03-25 NOTE — Progress Notes (Signed)
 Subjective:    Patient ID: Thomas Pratt, male    DOB: 1960/11/28, 63 y.o.   MRN: [id number]  Chief Complaint  Patient presents with   Headache    Dizziness, Difficulty walking, body aches all over,   Diabetes    Wants Dexcom   Pt presents to the office today with a headache, body aches, and nausea  that started a month ago that comes and goes. Denies any fever, cough,  or SOB.   He is requesting dexcom. States his herlene is not covered any longer.  Headache  Associated symptoms include nausea. Pertinent negatives include no blurred vision or fever.  Diabetes He has type 2 diabetes mellitus. His disease course has been stable. Hypoglycemia symptoms include headaches. Associated symptoms include fatigue and foot paresthesias. Pertinent negatives for diabetes include no blurred vision. Symptoms are stable. Diabetic complications include peripheral neuropathy. Risk factors for coronary artery disease include dyslipidemia, diabetes mellitus, hypertension and sedentary lifestyle. He is following a generally healthy diet. (Not checking glucose at home )      Review of Systems  Constitutional:  Positive for fatigue. Negative for fever.  Eyes:  Negative for blurred vision.  Gastrointestinal:  Positive for nausea.  Neurological:  Positive for headaches.  All other systems reviewed and are negative.   Social History   Socioeconomic History   Marital status: Divorced    Spouse name: Not on file   Number of children: Not on file   Years of education: Not on file   Highest education level: Not on file  Occupational History   Occupation: unemployed    Comment: disability  Tobacco Use   Smoking status: Never   Smokeless tobacco: Never  Vaping Use   Vaping status: Never Used  Substance and Sexual Activity   Alcohol use: No    Comment: Quit September 2017, heavy alcohol abuse previously   Drug use: No   Sexual activity: Not Currently    Partners: Female  Other Topics Concern    Not on file  Social History Narrative   Lives in [place], [place] with his youngest daughter; Is a retired Education administrator. Is on disability for cirrhosis.    Drank alcohol for years, quit 05/07/16. Has been sober since then. Quit b/c of health/cirrhosis.   Divorced for over 15 years.   Dating sporadically.   Has 3 daughters and 1 son live locally.   Originally from [place] , moved here b/c of wife.    Eats all foods.    Wear seatbelt.   Does not drive, does not have a license, lost due to DWI.    Brought by neighbor.    Does not go to church. Believes in God. Prays each night, per report.    Social Drivers of Corporate investment banker Strain: Low Risk  (11/20/2022)   Overall Financial Resource Strain (CARDIA)    Difficulty of Paying Living Expenses: Not hard at all  Food Insecurity: No Food Insecurity (11/20/2022)   Hunger Vital Sign    Worried About Running Out of Food in the Last Year: Never true    Ran Out of Food in the Last Year: Never true  Transportation Needs: No Transportation Needs (11/20/2022)   PRAPARE - Administrator, Civil Service (Medical): No    Lack of Transportation (Non-Medical): No  Physical Activity: Sufficiently Active (11/20/2022)   Exercise Vital Sign    Days of Exercise per Week: 5 days    Minutes of Exercise per Session: 30  min  Stress: No Stress Concern Present (11/20/2022)   [employer] of Occupational Health - Occupational Stress Questionnaire    Feeling of Stress : Not at all  Social Connections: Socially Isolated (11/20/2022)   Social Connection and Isolation Panel    Frequency of Communication with Friends and Family: More than three times a week    Frequency of Social Gatherings with Friends and Family: More than three times a week    Attends Religious Services: Never    Database administrator or Organizations: No    Attends Engineer, structural: Never    Marital Status: Divorced   Family History  Problem Relation Age of Onset   COPD  Mother    Liver disease Neg Hx    Colon cancer Neg Hx    Colon polyps Neg Hx         Objective:   Physical Exam Vitals reviewed.  Constitutional:      General: He is not in acute distress.    Appearance: He is well-developed.  HENT:     Head: Normocephalic.     Right Ear: Tympanic membrane and external ear normal.     Left Ear: Tympanic membrane and external ear normal.  Eyes:     General:        Right eye: No discharge.        Left eye: No discharge.     Pupils: Pupils are equal, round, and reactive to light.  Neck:     Thyroid : No thyromegaly.  Cardiovascular:     Rate and Rhythm: Normal rate and regular rhythm.     Heart sounds: Normal heart sounds. No murmur heard. Pulmonary:     Effort: Pulmonary effort is normal. No respiratory distress.     Breath sounds: Normal breath sounds. No wheezing.  Abdominal:     General: Bowel sounds are normal. There is no distension.     Palpations: Abdomen is soft.     Tenderness: There is no abdominal tenderness.  Musculoskeletal:        General: No tenderness. Normal range of motion.     Cervical back: Normal range of motion and neck supple.  Skin:    General: Skin is warm and dry.     Findings: No erythema or rash.  Neurological:     Mental Status: He is alert and oriented to person, place, and time.     Cranial Nerves: No cranial nerve deficit.     Deep Tendon Reflexes: Reflexes are normal and symmetric.  Psychiatric:        Behavior: Behavior normal.        Thought Content: Thought content normal.        Judgment: Judgment normal.       BP 119/65   Pulse 64   Temp 97.9 F (36.6 C)   Ht 5' 8 (1.727 m)   Wt 171 lb (77.6 kg)   SpO2 97%   BMI 26.00 kg/m      Assessment & Plan:  Thomas Pratt comes in today with chief complaint of Headache (Dizziness, Difficulty walking, body aches all over,) and Diabetes (Wants Dexcom)   Diagnosis and orders addressed:  1. Acute nonintractable headache, unspecified  headache type (Primary) - CMP14+EGFR - CBC with Differential/Platelet - Urinalysis, Complete  2. Other fatigue - CMP14+EGFR - CBC with Differential/Platelet - TSH - Urinalysis, Complete  3. Nausea - CMP14+EGFR - CBC with Differential/Platelet - Urinalysis, Complete  4. Myalgia - CMP14+EGFR - CBC with  Differential/Platelet  5. Type 2 diabetes mellitus with diabetic polyneuropathy, with long-term current use of insulin  (HCC) - Continuous Glucose Sensor (DEXCOM G7 SENSOR) MISC; 1 Application by Does not apply route every 14 (fourteen) days.  Dispense: 6 each; Refill: 2 - Continuous Glucose Receiver (DEXCOM G7 RECEIVER) DEVI; 1 Device by Does not apply route 3 (three) times daily as needed.  Dispense: 1 each; Refill: 3 - Bayer DCA Hb A1c Waived - CMP14+EGFR - CBC with Differential/Platelet - AMB Referral VBCI Care Management   Labs pending Referral to Clinical Pharmacist for diabetes Unsure what is causing headaches and body aches. Unlikely viral given duration of over a month. Will do full panel.  Continue current medications  Keep follow up with specialists  Health Maintenance reviewed Diet and exercise encouraged  Return in about 3 months (around 06/24/2024), or if symptoms worsen or fail to improve.    Bari Learn, FNP

## 2024-03-26 ENCOUNTER — Telehealth: Payer: Self-pay

## 2024-03-26 ENCOUNTER — Ambulatory Visit: Payer: Self-pay | Admitting: Family

## 2024-03-26 LAB — CBC WITH DIFFERENTIAL/PLATELET
Basophils Absolute: 0 x10E3/uL (ref 0.0–0.2)
Basos: 1 %
EOS (ABSOLUTE): 0.1 x10E3/uL (ref 0.0–0.4)
Eos: 2 %
Hematocrit: 48.8 % (ref 37.5–51.0)
Hemoglobin: 16.3 g/dL (ref 13.0–17.7)
Immature Grans (Abs): 0 x10E3/uL (ref 0.0–0.1)
Immature Granulocytes: 0 %
Lymphocytes Absolute: 2.2 x10E3/uL (ref 0.7–3.1)
Lymphs: 40 %
MCH: 33.2 pg — ABNORMAL HIGH (ref 26.6–33.0)
MCHC: 33.4 g/dL (ref 31.5–35.7)
MCV: 99 fL — ABNORMAL HIGH (ref 79–97)
Monocytes Absolute: 0.3 x10E3/uL (ref 0.1–0.9)
Monocytes: 6 %
Neutrophils Absolute: 2.7 x10E3/uL (ref 1.4–7.0)
Neutrophils: 51 %
Platelets: 178 x10E3/uL (ref 150–450)
RBC: 4.91 x10E6/uL (ref 4.14–5.80)
RDW: 12.7 % (ref 11.6–15.4)
WBC: 5.4 x10E3/uL (ref 3.4–10.8)

## 2024-03-26 LAB — CMP14+EGFR
ALT: 31 IU/L (ref 0–44)
AST: 31 IU/L (ref 0–40)
Albumin: 4.3 g/dL (ref 3.9–4.9)
Alkaline Phosphatase: 73 IU/L (ref 44–121)
BUN/Creatinine Ratio: 11 (ref 10–24)
BUN: 9 mg/dL (ref 8–27)
Bilirubin Total: 0.5 mg/dL (ref 0.0–1.2)
CO2: 21 mmol/L (ref 20–29)
Calcium: 9.7 mg/dL (ref 8.6–10.2)
Chloride: 101 mmol/L (ref 96–106)
Creatinine, Ser: 0.83 mg/dL (ref 0.76–1.27)
Globulin, Total: 2.7 g/dL (ref 1.5–4.5)
Glucose: 230 mg/dL — AB (ref 70–99)
Potassium: 4.7 mmol/L (ref 3.5–5.2)
Sodium: 135 mmol/L (ref 134–144)
Total Protein: 7 g/dL (ref 6.0–8.5)
eGFR: 98 mL/min/1.73 (ref >59)

## 2024-03-26 LAB — TSH: TSH: 1.32 u[IU]/mL (ref 0.450–4.500)

## 2024-03-26 NOTE — Progress Notes (Signed)
 Care Guide Pharmacy Note  03/26/2024 Name: [name] MRN: [id number] DOB: March 20, 1961  Referred By: Lavell Bari LABOR, FNP Reason for referral: Complex Care Management (Outreach to schedule with pharm d )   Thomas Pratt is a 63 y.o. year old male who is a primary care patient of Lavell Bari LABOR, FNP.  Lanis Storlie was referred to the pharmacist for assistance related to: DMII  An unsuccessful telephone outreach was attempted today to contact the patient who was referred to the pharmacy team for assistance with medication management. Additional attempts will be made to contact the patient.  [name] , RMA     [facility]  [facility], [facility] Guide  Direct Dial: [phone]  Website: [email or web address]

## 2024-03-30 NOTE — Progress Notes (Signed)
 Care Guide Pharmacy Note  03/30/2024 Name: Thomas Pratt MRN: [id number] DOB: [birth year]  Referred By: Lavell Bari LABOR, FNP Reason for referral: Complex Care Management (Outreach to schedule with pharm d )   Thomas Pratt is a 63 y.o. year old male who is a primary care patient of Lavell Bari LABOR, FNP.  Thomas Pratt was referred to the pharmacist for assistance related to: DMII  Successful contact was made with the patient to discuss pharmacy services including being ready for the pharmacist to call at least 5 minutes before the scheduled appointment time and to have medication bottles and any blood pressure readings ready for review. The patient agreed to meet with the pharmacist via telephone visit on (date/time).04/20/2024  [name] , RMA     [place]  [facility], [facility] Guide  Direct Dial: [phone]  Website: [email or web address]

## 2024-04-02 NOTE — Progress Notes (Signed)
 3rd attempt to try and contact patient. No answer. Left voicemail.

## 2024-04-08 ENCOUNTER — Encounter: Payer: Self-pay | Admitting: Gastroenterology

## 2024-04-19 ENCOUNTER — Telehealth: Payer: Self-pay | Admitting: Family

## 2024-04-19 NOTE — Progress Notes (Deleted)
   04/19/2024 Name: Thomas Pratt MRN: [id number] DOB: [birth year]  No chief complaint on file.   Thomas Pratt is a 63 y.o. year old male who was referred for medication management by their primary care provider, Lavell Bari LABOR, FNP. They presented for a face to face visit today. They were referred to the pharmacist by their PCP for assistance in managing diabetes    Subjective: ***  Care Team: Primary Care Provider: Lavell Bari LABOR, FNP ; Next Scheduled Visit: 06/29/2024  Medication Access/Adherence Current Pharmacy:  [facility] [address] [place] - [address] [place] [address] Phone: [phone] Fax: [phone]  [facility] Delivery) - [place], [place] - [address] [facility] [address] [place] [address] [place] [address] Phone: [phone] Fax: [phone]   Patient reports affordability concerns with their medications: {YES/NO:21197} Patient reports access/transportation concerns to their pharmacy: {YES/NO:21197} Patient reports adherence concerns with their medications:  {YES/NO:21197} ***  Diabetes: Current medications: Tresiba  30 units (increased from 25 units), Farxiga  10 mg daily, metformin  1500 mg, Ozempic  2 mg weekly Statin: atorvastatin  10 mg daily LDL of 95 on 08/26/23 ACEi/ARB: none UACR of <6 on 03/06/23, due for updated lab  A1c of 8.9% on 03/25/24, slightly increased from 8.8% on 01/01/24  DEXCOM Date of Download: *** % Time CGM is active: ***% Average Glucose: *** mg/dL Glucose Management Indicator: ***  Glucose Variability: *** (goal <36%) Time in Goal:  - Time in range 70-180: ***% - Time above range: ***% - Time below range: ***% Observed patterns:  Patient {Actions; denies-reports:120008} hypoglycemic s/sx including ***dizziness, shakiness, sweating. Patient {Actions; denies-reports:120008} hyperglycemic symptoms including ***polyuria, polydipsia, polyphagia, nocturia,  neuropathy, blurred vision.  Current meal patterns:  - Breakfast: *** - Lunch *** - Supper *** - Snacks *** - Drinks ***  Current physical activity: ***  Current medication access support: ***  {Pharmacy S/O Choices:26420}   Objective: Lab Results  Component Value Date   HGBA1C 8.9 (H) 03/25/2024   Lab Results  Component Value Date   CREATININE 0.83 03/25/2024   BUN 9 03/25/2024   NA 135 03/25/2024   K 4.7 03/25/2024   CL 101 03/25/2024   CO2 21 03/25/2024   Lab Results  Component Value Date   CHOL 154 08/26/2023   HDL 39 (L) 08/26/2023   LDLCALC 95 08/26/2023   TRIG 111 08/26/2023   CHOLHDL 3.9 08/26/2023   Medications Reviewed Today   Medications were not reviewed in this encounter     Assessment/Plan:  Diabetes: Currently uncontrolled Goal of <7% Reviewed long term cardiovascular and renal outcomes of uncontrolled blood sugar Reviewed goal A1c, goal fasting, and goal 2 hour post prandial glucose Reviewed dietary modifications including *** Reviewed lifestyle modifications including: *** Recommend to ***  Patient denies personal or family history of multiple endocrine neoplasia type 2, medullary thyroid  cancer; personal history of pancreatitis or gallbladder disease. Recommend to check glucose *** Meets financial criteria for *** patient assistance program through ***. Will collaborate with provider, CPhT, and patient to pursue assistance.   {Pharmacy A/P Choices:26421}  Follow Up Plan: ***  ***

## 2024-04-19 NOTE — Telephone Encounter (Signed)
 Copied from CRM #[id number]. Topic: Appointments - Appointment Cancel/Reschedule >> Apr 19, 2024 10:50 AM Marylynn H wrote:   Patient is needing to reschedule his appt for tomorrow with pharmacist. I am unable to reschedule due to my access. Please reach out for rescheduling, ty. # [phone]  Apr 20 2024 01:00 PM - PHARMACY CLINIC [facility] - [facility]-PHARMACIST

## 2024-04-20 ENCOUNTER — Ambulatory Visit

## 2024-04-20 ENCOUNTER — Telehealth: Payer: Self-pay

## 2024-04-20 NOTE — Telephone Encounter (Signed)
 Attempted to call Thomas Pratt regarding his appointment at [facility] for medication management and diabetes. I was unable to contact, left a HIPAA-complaint voicemail with direct call back number.   Woodie Jock, PharmD PGY1 Pharmacy Resident  04/20/2024

## 2024-04-20 NOTE — Progress Notes (Signed)
 Complex Care Management Care Guide Note  04/20/2024 Name: Thomas Pratt MRN: [id number] DOB: 1960-10-01  Thomas Pratt is a 63 y.o. year old male who is a primary care patient of Lavell Bari LABOR, FNP and is actively engaged with the care management team. I reached out to [name] by phone today to assist with re-scheduling  with the Pharmacist.  Follow up plan: Unsuccessful telephone outreach attempt made. A HIPAA compliant phone message was left for the patient providing contact information and requesting a return call.  [name] , RMA     [facility]  [facility], [facility] Guide  Direct Dial: [phone]  Website: [email or web address]

## 2024-04-20 NOTE — Progress Notes (Deleted)
   04/20/2024 Name: Thomas Pratt MRN: [id number] DOB: Feb 24, 1961  No chief complaint on file.   Thomas Pratt is a 63 y.o. year old male who was referred for medication management by their primary care provider, Lavell Bari LABOR, FNP. They presented for a face to face visit today. They were referred to the pharmacist by their PCP for assistance in managing diabetes    Subjective: ***  Care Team: Primary Care Provider: Lavell Bari LABOR, FNP ; Next Scheduled Visit: 06/29/2024  Medication Access/Adherence Current Pharmacy:  [facility] [address] [place] - [address] [place] [address] Phone: [phone] Fax: [phone]  [facility] Delivery) - [place], [place] - [address] [facility] [address] [place] [address] [place] [address] Phone: [phone] Fax: [phone]   Patient reports affordability concerns with their medications: {YES/NO:21197} Patient reports access/transportation concerns to their pharmacy: {YES/NO:21197} Patient reports adherence concerns with their medications:  {YES/NO:21197} ***  Diabetes: Current medications: Tresiba  30 units (increased from 25 units), Farxiga  10 mg daily, metformin  1500 mg, Ozempic  2 mg weekly Statin: atorvastatin  10 mg daily LDL of 95 on 08/26/23 ACEi/ARB: none UACR of <6 on 03/06/23, due for updated lab  A1c of 8.9% on 03/25/24, slightly increased from 8.8% on 01/01/24  DEXCOM Date of Download: *** % Time CGM is active: ***% Average Glucose: *** mg/dL Glucose Management Indicator: ***  Glucose Variability: *** (goal <36%) Time in Goal:  - Time in range 70-180: ***% - Time above range: ***% - Time below range: ***% Observed patterns:  Patient {Actions; denies-reports:120008} hypoglycemic s/sx including ***dizziness, shakiness, sweating. Patient {Actions; denies-reports:120008} hyperglycemic symptoms including ***polyuria, polydipsia, polyphagia, nocturia,  neuropathy, blurred vision.  Current meal patterns:  - Breakfast: *** - Lunch *** - Supper *** - Snacks *** - Drinks ***  Current physical activity: ***  Current medication access support: ***  {Pharmacy S/O Choices:26420}   Objective: Lab Results  Component Value Date   HGBA1C 8.9 (H) 03/25/2024   Lab Results  Component Value Date   CREATININE 0.83 03/25/2024   BUN 9 03/25/2024   NA 135 03/25/2024   K 4.7 03/25/2024   CL 101 03/25/2024   CO2 21 03/25/2024   Lab Results  Component Value Date   CHOL 154 08/26/2023   HDL 39 (L) 08/26/2023   LDLCALC 95 08/26/2023   TRIG 111 08/26/2023   CHOLHDL 3.9 08/26/2023   Medications Reviewed Today   Medications were not reviewed in this encounter     Assessment/Plan:  Diabetes: Currently uncontrolled Goal of <7% Reviewed long term cardiovascular and renal outcomes of uncontrolled blood sugar Reviewed goal A1c, goal fasting, and goal 2 hour post prandial glucose Reviewed dietary modifications including *** Reviewed lifestyle modifications including: *** Recommend to ***  Patient denies personal or family history of multiple endocrine neoplasia type 2, medullary thyroid  cancer; personal history of pancreatitis or gallbladder disease. Recommend to check glucose *** Meets financial criteria for *** patient assistance program through ***. Will collaborate with provider, CPhT, and patient to pursue assistance.   {Pharmacy A/P Choices:26421}  Follow Up Plan: ***  ***

## 2024-04-21 ENCOUNTER — Telehealth: Payer: Self-pay | Admitting: Family

## 2024-04-21 NOTE — Telephone Encounter (Signed)
 Copied from CRM #[id number]. Topic: General - Other >> Apr 20, 2024  4:53 PM Turkey B wrote: Reason for CRM: Patient returning call o Pharmacist, woodie Jock, for questions about his med. Please cb

## 2024-04-22 ENCOUNTER — Encounter (INDEPENDENT_AMBULATORY_CARE_PROVIDER_SITE_OTHER): Payer: Self-pay | Admitting: *Deleted

## 2024-04-26 NOTE — Progress Notes (Unsigned)
 Complex Care Management Care Guide Note  04/26/2024 Name: Jemery Stacey MRN: [id number] DOB: 03-28-1961  Mcguire Gasparyan is a 63 y.o. year old male who is a primary care patient of Lavell Bari LABOR, FNP and is actively engaged with the care management team. I reached out to [name] by phone today to assist with re-scheduling  with the Pharmacist.  Follow up plan: Unsuccessful telephone outreach attempt made. A HIPAA compliant phone message was left for the patient providing contact information and requesting a return call.  [name] , RMA     [facility]  [facility], [facility] Guide  Direct Dial: [phone]  Website: [email or web address]

## 2024-04-27 NOTE — Progress Notes (Signed)
 Complex Care Management Care Guide Note  04/27/2024 Name: Thomas Pratt MRN: [id number] DOB: 03-04-1961  Thomas Pratt is a 63 y.o. year old male who is a primary care patient of Lavell Bari LABOR, FNP and is actively engaged with the care management team. I reached out to [name] by phone today to assist with re-scheduling  with the Pharmacist.  Follow up plan: Unsuccessful telephone outreach attempt made. A HIPAA compliant phone message was left for the patient providing contact information and requesting a return call.  [name] , RMA     [facility]  [facility], [facility] Guide  Direct Dial: [phone]  Website: [email or web address]

## 2024-05-11 ENCOUNTER — Encounter: Payer: Self-pay | Admitting: Internal Medicine

## 2024-05-13 ENCOUNTER — Telehealth: Payer: Self-pay | Admitting: Pharmacist

## 2024-05-13 DIAGNOSIS — E119 Type 2 diabetes mellitus without complications: Secondary | ICD-10-CM

## 2024-05-13 NOTE — Telephone Encounter (Signed)
 New Dexcom g7 CGM order sent to [facility] via parachute portal PA info completed and submitted to Medicaid via portal

## 2024-05-17 NOTE — Progress Notes (Unsigned)
 05/18/2024 Name: Thomas Pratt MRN: [id number] DOB: [birth year]  Chief Complaint  Patient presents with   Diabetes   Medication Management    Thomas Pratt is Pratt 63 y.o. year old male who was referred for medication management by their primary care provider, Thomas Bari LABOR, Pratt. They presented for Pratt telephone visit today. They were referred to the pharmacist by their PCP for assistance in managing diabetes    Subjective: Thomas Pratt presents for an in-person follow-up appointment for his diabetes. He was able to pick up his Dexcom g7 from [place] with Pratt $0 co-pay. He stopped taking his once-daily insulin  injection due to injection site pain. Regarding his other medications, he reports being adherent to his metformin , Ozempic , and atorvastatin . However, according to [facility] refill records, he has not filled any of these prescriptions in quite some time. I verified with the pharmacy during the visit, and they confirmed that the patient has not had any recent fills.  There is concern regarding polypharmacy and confusion in managing his medications. Will send in updated prescriptions today and schedule an in-person follow-up appointment for him to bring in all of his medications for Pratt thorough medication evaluation. He does have Pratt weekly pill organizer at home, but he may benefit from an AM/PM weekly organizer--will provide at follow-up.   Care Team: Primary Care Provider: Lavell Bari LABOR, Pratt ; Next Scheduled Visit: 06/29/2024  Medication Access/Adherence Current Pharmacy:  [facility] [address] [place] - [address] [place] [address] Phone: [phone] Fax: [phone]  [facility]) - [place], [place] - [address] [facility] [address] [place] [address] [place] [address] Phone: [phone] Fax: [phone]   Patient reports affordability concerns with their medications: No  Patient reports  access/transportation concerns to their pharmacy: Yes  - has transportation through [facility] and is limited to two rides per month Patient reports adherence concerns with their medications:  No  - he has been having difficulty remember which medications that he takes; often forgets to take medications  Diabetes: Current medications:  Farxiga  10 mg daily, Metformin  1500 mg, Ozempic  2 mg weekly (only 1 mg, hasn't filled since 2024)  Previous medications: Tresiba  (self-discontinued due to injection site pain) Statin: atorvastatin  10 mg daily (LF 08/2023) LDL of 95 on 08/26/23 ACEi/ARB: none UACR of <6 on 03/06/23, due for updated lab  A1c of 8.9% on 03/25/24, slightly increased from 8.8% on 01/01/24  DEXCOM G7--USES READER Date of Download: 05/18/24 % Time CGM is active: 82% Average Glucose: 255 mg/dL Glucose Management Indicator: 9.4%  Glucose Variability: 26.6 (goal <36%) Time in Goal:  - Time in range 70-180: 13% - Time above range: 87% - Time below range: 0%  Patient denies hypoglycemic s/sx including dizziness, shakiness, sweating. Patient denies hyperglycemic symptoms including polyuria, polydipsia, polyphagia, nocturia, neuropathy, blurred vision.  Current meal patterns:  Discussed meal planning options and Plate method for healthy eating Avoid sugary drinks and desserts Incorporate balanced protein, non starchy veggies, 1 serving of carbohydrate with each meal Increase water intake Increase physical activity as able    Objective: Lab Results  Component Value Date   HGBA1C 8.9 (H) 03/25/2024   Lab Results  Component Value Date   CREATININE 0.83 03/25/2024   BUN 9 03/25/2024   NA 135 03/25/2024   K 4.7 03/25/2024   CL 101 03/25/2024   CO2 21 03/25/2024   Lab Results  Component Value Date   CHOL  154 08/26/2023   HDL 39 (L) 08/26/2023   LDLCALC 95 08/26/2023   TRIG 111 08/26/2023   CHOLHDL 3.9 08/26/2023   Medications Reviewed Today     Reviewed by Thomas Pratt, [facility] (Pharmacist) on 05/18/24 at 1137  Med List Status: <None>   Medication Order Taking? Sig Documenting Provider Last Dose Status Informant  Accu-Chek Softclix Lancets lancets [id number]  Test BS 4 (four) times daily. Dx E11.69  Patient taking differently: 1 each by Other route 4 (four) times daily as needed. Test BS 4 (four) times daily. Dx E11.69   Thomas Lye Pratt, Pratt  Active Self  aspirin  EC 81 MG tablet [id number]  Take 1 tablet (81 mg total) by mouth daily. Swallow whole. Thomas Lye Pratt, Pratt  Active   atorvastatin  (LIPITOR) 10 MG tablet [id number]  Take 1 tablet by mouth once daily Thomas Pratt  Active   Blood Glucose Monitoring Suppl DEVI [id number]  1 each by Does not apply route in the morning, at noon, and at bedtime. May substitute to any manufacturer covered by patient's insurance. Thomas Pratt  Active   Continuous Glucose Receiver (DEXCOM G7 RECEIVER) DEVI [id number]  1 Device by Does not apply route 3 (three) times daily as needed. Thomas Pratt  Active   Continuous Glucose Sensor (DEXCOM G7 SENSOR) MISC [id number]  1 Application by Does not apply route every 14 (fourteen) days. Thomas Pratt  Active   dexlansoprazole  (DEXILANT ) 60 MG capsule [id number]  Take 1 capsule by mouth once daily Thomas Lye Pratt, Pratt  Active   diclofenac  Sodium (VOLTAREN ) 1 % GEL [id number]  Apply 2 g topically 4 (four) times daily. Thomas Pratt HERO, NP  Active Self  escitalopram  (LEXAPRO ) 20 MG tablet [id number]  Take 1 tablet by mouth once daily Thomas Pratt  Active   FARXIGA  10 MG TABS tablet [id number] Yes TAKE 1 TABLET BY MOUTH ONCE DAILY BEFORE BREAKFAST Hawks, Christy Pratt, Pratt  Active   fluticasone  (FLONASE ) 50 MCG/ACT nasal spray [id number]  Place 2 sprays into both nostrils daily as needed for allergies. Thomas Lye Pratt, Pratt  Active   furosemide  (LASIX ) 20 MG tablet [id number]  Take 1 tablet (20 mg total) by mouth daily. Thomas Therisa ORN, NP  Active    gabapentin  (NEURONTIN ) 800 MG tablet [id number]  TAKE 1 TABLET BY MOUTH THREE TIMES DAILY Thomas Pratt  Active   Insulin  Pen Needle (B-D UF III MINI PEN NEEDLES) 31G X 5 MM MISC [id number]  USE WITH TRESIBA  DAILY Thomas Lye Pratt, Pratt  Active Self  metFORMIN  (GLUCOPHAGE -XR) 750 MG 24 hr tablet [id number] Yes TAKE 2 TABLETS BY MOUTH ONCE DAILY WITH BREAKFAST Hawks, Christy Pratt, Pratt  Active   nadolol  (CORGARD ) 40 MG tablet [id number]  Take 1 tablet by mouth once daily Lewis, Leslie S, PA-C  Active   nystatin  (MYCOSTATIN /NYSTOP ) powder [id number]  APPLY POWDER TOPICALLY TO AFFECTED AREA 3 TIMES DAILY Thomas Lye Pratt, Pratt  Active   nystatin  cream (MYCOSTATIN ) [id number]  APPLY  CREAM TOPICALLY TO AFFECTED AREA TWICE DAILY Hawks, Christy Pratt, Pratt  Active   rifaximin  (XIFAXAN ) 550 MG TABS tablet [id number]  Take 1 tablet (550 mg total) by mouth 2 (two) times daily.  Patient taking differently: Take 550 mg by mouth daily.   Marvis Camellia LABOR, NP  Active Self  spironolactone  (ALDACTONE ) 50 MG tablet [id number]  Take 1 tablet (50 mg total) by mouth daily. Thomas Therisa ORN, NP  Active   TRESIBA  FLEXTOUCH 100 UNIT/ML FlexTouch Pen [id number]  INJECT 25 UNITS SUBCUTANEOUSLY AT BEDTIME Thomas Bari LABOR, Pratt  Active   VITAMIN D  PO [id number]  Take 1 capsule by mouth daily.  [provider]  Active Self            Assessment/Plan:  Diabetes: Currently uncontrolled. Thomas Pratt has difficulty remembering which medications that he is supposed to be taking. He states that he has Pratt surplus of medications at home, however, he has not filled his prescriptions in many months.  Goal of <7% Reviewed long term cardiovascular and renal outcomes of uncontrolled blood sugar Reviewed goal A1c, goal fasting, and goal 2 hour post prandial glucose Recommend to:   RESTART Ozempic  at 0.25 mg weekly Prescription sent to pharmacy Continue metformin  750 mg BID RESTART atorvastatin  10 mg daily Prescription sent to pharmacy Will  follow-up next week for Pratt full medication review, patient to bring in all of this medications Future Consideration:  Discussed pill packing with patient; will wait until stabilized on current medications before transitioning.  Potential to restart basal insulin  at follow-up Patient denies personal or family history of multiple endocrine neoplasia type 2, medullary thyroid  cancer; personal history of pancreatitis or gallbladder disease. Recommend to check glucose continuously with CGM   Follow Up Plan:  PCP 06/29/2024 PharmD 05/25/2024 Full medication review, patient to bring in all current medications. Will verify pick-up of prescriptions from pharmacy. UACR ORDERED FOR F/U  Woodie Jock, PharmD PGY1 Pharmacy Resident  05/18/2024   Mliss Tarry Griffin, PharmD, BCACP, CPP Clinical Pharmacist, [facility]

## 2024-05-18 ENCOUNTER — Ambulatory Visit: Admitting: Pharmacist

## 2024-05-18 ENCOUNTER — Other Ambulatory Visit: Payer: Self-pay | Admitting: Family

## 2024-05-18 DIAGNOSIS — Z794 Long term (current) use of insulin: Secondary | ICD-10-CM | POA: Diagnosis not present

## 2024-05-18 DIAGNOSIS — E785 Hyperlipidemia, unspecified: Secondary | ICD-10-CM | POA: Diagnosis not present

## 2024-05-18 DIAGNOSIS — E1142 Type 2 diabetes mellitus with diabetic polyneuropathy: Secondary | ICD-10-CM

## 2024-05-18 DIAGNOSIS — E1169 Type 2 diabetes mellitus with other specified complication: Secondary | ICD-10-CM | POA: Diagnosis not present

## 2024-05-18 DIAGNOSIS — F320 Major depressive disorder, single episode, mild: Secondary | ICD-10-CM

## 2024-05-18 MED ORDER — OZEMPIC (0.25 OR 0.5 MG/DOSE) 2 MG/3ML ~~LOC~~ SOPN: 0.2500 mg | PEN_INJECTOR | SUBCUTANEOUS | 1 refills | Status: DC

## 2024-05-18 MED ORDER — ATORVASTATIN CALCIUM 10 MG PO TABS: 10.0000 mg | ORAL_TABLET | Freq: Every day | ORAL | 1 refills | Status: AC

## 2024-05-18 NOTE — Patient Instructions (Addendum)
 Bring in ALL medications (tablets and injections) to your next visit on 05/25/24 at 11 AM CONTINUE taking metformin  1 pill twice daily START Ozempic  0.25 mg weekly (pick up from [facility]) STOP taking your Ozempic  that you have at home START atorvastatin  10 mg daily (pick up from [place])

## 2024-05-25 ENCOUNTER — Ambulatory Visit (INDEPENDENT_AMBULATORY_CARE_PROVIDER_SITE_OTHER): Payer: Self-pay | Admitting: Pharmacist

## 2024-05-25 DIAGNOSIS — E119 Type 2 diabetes mellitus without complications: Secondary | ICD-10-CM

## 2024-05-25 DIAGNOSIS — F1011 Alcohol abuse, in remission: Secondary | ICD-10-CM

## 2024-05-25 DIAGNOSIS — K7460 Unspecified cirrhosis of liver: Secondary | ICD-10-CM | POA: Diagnosis not present

## 2024-05-25 DIAGNOSIS — Z7985 Long-term (current) use of injectable non-insulin antidiabetic drugs: Secondary | ICD-10-CM

## 2024-05-25 DIAGNOSIS — I8510 Secondary esophageal varices without bleeding: Secondary | ICD-10-CM

## 2024-05-25 DIAGNOSIS — K766 Portal hypertension: Secondary | ICD-10-CM

## 2024-05-25 MED ORDER — NADOLOL 40 MG PO TABS: 40.0000 mg | ORAL_TABLET | Freq: Every day | ORAL | 3 refills | Status: AC

## 2024-05-25 MED ORDER — RIFAXIMIN 550 MG PO TABS
550.0000 mg | ORAL_TABLET | Freq: Two times a day (BID) | ORAL | 3 refills | Status: AC
Start: 2024-05-25 — End: ?

## 2024-05-25 NOTE — Progress Notes (Signed)
 05/25/2024 Name: Thomas Pratt MRN: [id number] DOB: 23-Nov-1960  Chief Complaint  Patient presents with   Diabetes    Thomas Pratt is a 63 y.o. year old male who was referred for medication management by their primary care provider, Lavell Bari LABOR, FNP. They presented for a telephone visit today. They were referred to the pharmacist by their PCP for assistance in managing diabetes    Subjective: Thomas Pratt presents for a follow up in-person follow-up appointment for his diabetes. He was able to pick up his Dexcom g7 from [place] with a $0 co-pay (uses reader).  He started Ozempic  0.25mg  weekly (today is day 14 of that ) We continue to hold Tresiba  once daily insulin  due to injection site pain. He presents today with all medications from home.  He is currently using a weekly once daily pill box.    There is concern regarding polypharmacy and confusion in managing his medications.  Care Team: Primary Care Provider: Lavell Bari LABOR, FNP ; Next Scheduled Visit: 06/29/2024  Medication Access/Adherence Current Pharmacy:  [facility] [address] [place] - [address] [place] [address] Phone: [phone] Fax: [phone]  [facility] Delivery) - [place], [place] - [address] [facility] [address] [place] [address] [place] [address] Phone: [phone] Fax: [phone]   Patient reports affordability concerns with their medications: No  Patient reports access/transportation concerns to their pharmacy: Yes  - has transportation through [facility] and is limited to two rides per month Patient reports adherence concerns with their medications:  Yes  - he has been having difficulty remember which medications that he takes; often forgets to take medications  Diabetes: Current medications:  Farxiga  10 mg daily, Metformin  1500 mg, Ozempic  0.25mg  weekly (only 1 mg, hasn't filled since 2024)  Previous medications: Tresiba   (self-discontinued due to injection site pain) Statin: atorvastatin  10 mg daily (LF 08/2023) LDL of 95 on 08/26/23 ACEi/ARB: none UACR of <6 on 03/06/23, due for updated lab  A1c of 8.9% on 03/25/24, slightly increased from 8.8% on 01/01/24  DEXCOM G7--USES READER Date of Download: 05/25/24 % Time CGM is active: 82% Average Glucose: 255 mg/dL-->215/14 day Glucose Management Indicator: 9.4%-->8.5%  Glucose Variability: 26.6 (goal <36%) Time in Goal:  - Time in range 70-180: 13%-->32% - Time above range: 87%-->72% - Time below range: 0%  Patient denies hypoglycemic s/sx including dizziness, shakiness, sweating. Patient denies hyperglycemic symptoms including polyuria, polydipsia, polyphagia, nocturia, neuropathy, blurred vision.  Current meal patterns:  Discussed meal planning options and Plate method for healthy eating Avoid sugary drinks and desserts Incorporate balanced protein, non starchy veggies, 1 serving of carbohydrate with each meal Increase water intake Increase physical activity as able    Objective: Lab Results  Component Value Date   HGBA1C 8.9 (H) 03/25/2024   Lab Results  Component Value Date   CREATININE 0.83 03/25/2024   BUN 9 03/25/2024   NA 135 03/25/2024   K 4.7 03/25/2024   CL 101 03/25/2024   CO2 21 03/25/2024   Lab Results  Component Value Date   CHOL 154 08/26/2023   HDL 39 (L) 08/26/2023   LDLCALC 95 08/26/2023   TRIG 111 08/26/2023   CHOLHDL 3.9 08/26/2023   Medications Reviewed Today   Medications were not reviewed in this encounter     Assessment/Plan:  Diabetes: Currently uncontrolled. Thomas Pratt has difficulty remembering which medications that he is supposed to be taking. He states that he has a  surplus of medications at home, however, he has not filled his prescriptions in many months.  Goal of <7% Reviewed long term cardiovascular and renal outcomes of uncontrolled blood sugar Reviewed goal A1c, goal fasting, and goal 2 hour post  prandial glucose Recommend to:   Continue Ozempic  at 0.25 mg weekly Continue metformin  750 mg BID Continue atorvastatin  10 mg daily Cont[place]u all other medications as prescribed AM/PM pill box provided Will follow-up next week for a full medication review, patient to bring in all of this medications Future Consideration:  Discussed pill packing with patient; will wait until stabilized on current medications before transitioning.  Potential to restart basal insulin  at follow-up Patient denies personal or family history of multiple endocrine neoplasia type 2, medullary thyroid  cancer; personal history of pancreatitis or gallbladder disease. Recommend to check glucose continuously with CGM UACR collected    Follow Up Plan:  PCP 06/29/2024 PharmD 06/15/2024   Thomas Pratt, PharmD, BCACP, CPP Clinical Pharmacist, [facility]

## 2024-05-26 ENCOUNTER — Other Ambulatory Visit (HOSPITAL_COMMUNITY): Payer: Self-pay

## 2024-05-26 ENCOUNTER — Other Ambulatory Visit (HOSPITAL_COMMUNITY)
Admission: RE | Admit: 2024-05-26 | Discharge: 2024-05-26 | Disposition: A | Discharge status: Home / Self Care | Source: Ambulatory Visit | Attending: Gastroenterology | Admitting: Gastroenterology

## 2024-05-26 ENCOUNTER — Encounter: Payer: Self-pay | Admitting: Gastroenterology

## 2024-05-26 ENCOUNTER — Ambulatory Visit: Admitting: Gastroenterology

## 2024-05-26 ENCOUNTER — Telehealth: Payer: Self-pay | Admitting: *Deleted

## 2024-05-26 VITALS — BP 119/69 | HR 60 | Temp 97.9°F | Ht 68.0 in | Wt 168.3 lb

## 2024-05-26 DIAGNOSIS — K7460 Unspecified cirrhosis of liver: Secondary | ICD-10-CM

## 2024-05-26 DIAGNOSIS — Z8639 Personal history of other endocrine, nutritional and metabolic disease: Secondary | ICD-10-CM

## 2024-05-26 DIAGNOSIS — R1310 Dysphagia, unspecified: Secondary | ICD-10-CM

## 2024-05-26 LAB — CBC WITH DIFFERENTIAL/PLATELET
Abs Immature Granulocytes: 0.01 K/uL (ref 0.00–0.07)
Basophils Absolute: 0 K/uL (ref 0.0–0.1)
Basophils Relative: 0 %
Eosinophils Absolute: 0.2 K/uL (ref 0.0–0.5)
Eosinophils Relative: 4 %
HCT: 47.8 % (ref 39.0–52.0)
Hemoglobin: 16.4 g/dL (ref 13.0–17.0)
Immature Granulocytes: 0 %
Lymphocytes Relative: 46 %
Lymphs Abs: 2.4 K/uL (ref 0.7–4.0)
MCH: 33.2 pg (ref 26.0–34.0)
MCHC: 34.3 g/dL (ref 30.0–36.0)
MCV: 96.8 fL (ref 80.0–100.0)
Monocytes Absolute: 0.4 K/uL (ref 0.1–1.0)
Monocytes Relative: 7 %
Neutro Abs: 2.2 K/uL (ref 1.7–7.7)
Neutrophils Relative %: 43 %
Platelets: 182 K/uL (ref 150–400)
RBC: 4.94 MIL/uL (ref 4.22–5.81)
RDW: 12.7 % (ref 11.5–15.5)
WBC: 5.2 K/uL (ref 4.0–10.5)
nRBC: 0 % (ref 0.0–0.2)

## 2024-05-26 LAB — COMPREHENSIVE METABOLIC PANEL WITH GFR
ALT: 22 U/L (ref 0–44)
AST: 27 U/L (ref 15–41)
Albumin: 4.8 g/dL (ref 3.5–5.0)
Alkaline Phosphatase: 60 U/L (ref 38–126)
Anion gap: 8 (ref 5–15)
BUN: 11 mg/dL (ref 8–23)
CO2: 30 mmol/L (ref 22–32)
Calcium: 9.7 mg/dL (ref 8.9–10.3)
Chloride: 101 mmol/L (ref 98–111)
Creatinine, Ser: 0.89 mg/dL (ref 0.61–1.24)
GFR, Estimated: >60 mL/min (ref >60)
Glucose, Bld: 133 mg/dL — ABNORMAL HIGH (ref 70–99)
Potassium: 4.5 mmol/L (ref 3.5–5.1)
Sodium: 139 mmol/L (ref 135–145)
Total Bilirubin: 0.6 mg/dL (ref 0.0–1.2)
Total Protein: 7.9 g/dL (ref 6.5–8.1)

## 2024-05-26 LAB — PROTIME-INR
INR: 1 (ref 0.8–1.2)
Prothrombin Time: 14 s (ref 11.4–15.2)

## 2024-05-26 NOTE — Progress Notes (Signed)
 Gastroenterology Office Note     Primary Care Physician:  Lavell Bari LABOR, FNP  Primary Gastroenterologist: Dr. Shaaron    Chief Complaint   Chief Complaint  Patient presents with   Follow-up    Pt arrives for follow up. States things are going ok. GERD is slowly getting better. Having some pain in left side of abdomen.      History of Present Illness   Thomas Pratt is a 63 y.o. male presenting today with a history of cirrhosis presumably due to prior alcohol abuse (sober since 2017) however, he had positive AMA, AMA, ASMA (on repeat negative), elevated IgG, liver biopsy with granulomatous hepatitis. Evaluated by [facility] liver transplant center.  Labs repeated and they reviewed liver biopsy slides and suspected cirrhosis secondary to alcohol use but did have hepatic sarcoidosis.  Pulmonary work-up negative for lung involvement.  Urso  was stopped by [facility] liver, esophageal varices on Nadolol , last seen by [facility] July 2024 with recs to follow locally for MELD and PETH. Only return to [facility] if decompensation (not rise in MELD). Last seen in April 2025 for routine visit.    No abdominal pain, N/V, changes in bowel habits, constipation, diarrhea, overt GI bleeding, GERD, unexplained weight loss, lack of appetite, unexplained weight gain. No mental status changes, confusion, jaundice, pruritus. Continues to abstain from ETOH sinc 2017. PEth continues to be negative.   Diuretics: lasix  20 mg and aldactone  50 mg,Rare lower extremity edema. Tolerating this well Xifaxan  BID. BM about 3 times a day, not taking lactulose  as has 3 soft BMs daily without this.   Feels at times like food sometimes hangs in esophagus. Dexilant  daily for GERD.    GI/HEPATOLOGY HEALTHCARE MAINTENANCE  HAV immunity: immune, 2017 HBV immunity: Immune, labs 2023 Pneumonia vaccinations: Recommend  Influenza vaccination: Recommend  COVID vaccination: unknown  Variceal screening: last in Oct 2023 Grade 1  varices, due in Oct 2025 HCC screening (imaging/AFP): US  abdomen April 2025 no HCC, AFP normal April 2025 Varices prophylaxis: Nadolol  40 mg daily, HR 60 SBP prophylaxis: N/A Diuretics: lasix  20 mg daily and aldactone  50 mg daily Colonoscopy: surveillance in 2028 DEXA scan: needs in future due to increased risk of osteopenia in patients with cirrhosis, VIt D deficiency in past, last year 75, needs updated.    Colonoscopy 04/2022: -nonbleeding internal hemorrhoids -four 4-77mm polyps removed. -tubular adenomas -next colonoscopy 5 years   EGD 04/2022: -grade I esophageal varices -gastritis -small hh -repeat EGD in two years  Past Medical History:  Diagnosis Date   Anxiety    Cirrhosis (HCC)    likely ETOH use    Depression    Diabetes (HCC)    GERD (gastroesophageal reflux disease)    Heat stroke 2016/17   Hypertension     Past Surgical History:  Procedure Laterality Date   CHOLECYSTECTOMY N/A 09/01/2017   Procedure: LAPAROSCOPIC CHOLECYSTECTOMY;  Surgeon: Mavis Anes, MD;  Location: [facility];  Service: General;  Laterality: N/A;   COLONOSCOPY WITH PROPOFOL  N/A 03/06/2017   Dr. Shaaron: tubular adenoma removed, next TCS 5 years if health permits.    COLONOSCOPY WITH PROPOFOL  N/A 04/23/2022   Procedure: COLONOSCOPY WITH PROPOFOL ;  Surgeon: Cindie Carlin POUR, DO;  Location: [facility];  Service: Endoscopy;  Laterality: N/A;  10:30am, asa 3   ESOPHAGOGASTRODUODENOSCOPY (EGD) WITH PROPOFOL  N/A 03/06/2017   Dr. Shaaron: grade 1 esophageal varices, erosive esophagitis, portal gastropathy.    ESOPHAGOGASTRODUODENOSCOPY (EGD) WITH PROPOFOL  N/A 05/03/2019   Procedure: ESOPHAGOGASTRODUODENOSCOPY (  EGD) WITH PROPOFOL ;  Surgeon: Shaaron Lamar HERO, MD;  [facility]: [facility];  Service: Endoscopy;  Laterality: N/A;  10:30am   ESOPHAGOGASTRODUODENOSCOPY (EGD) WITH PROPOFOL  N/A 12/23/2019   Procedure: ESOPHAGOGASTRODUODENOSCOPY (EGD) WITH PROPOFOL  Possible esophageal variceal banding.;  Surgeon:  Shaaron Lamar HERO, MD;  [facility]: [facility];  Service: Endoscopy;  Laterality: N/A;   ESOPHAGOGASTRODUODENOSCOPY (EGD) WITH PROPOFOL  N/A 04/23/2022   Procedure: ESOPHAGOGASTRODUODENOSCOPY (EGD) WITH PROPOFOL ;  Surgeon: Cindie Carlin POUR, DO;  [facility];  Service: Endoscopy;  Laterality: N/A;   HERNIA REPAIR Right    RIH   INGUINAL HERNIA REPAIR Right 07/11/2017   Procedure: HERNIA REPAIR INGUINAL ADULT WITH MESH;  Surgeon: Mavis Anes, MD;  Location: [facility];  Service: General;  Laterality: Right;  patient knows to arrive at 7:30   IR TRANSCATHETER BX  10/14/2017   IR US  GUIDE VASC ACCESS RIGHT  10/14/2017   IR VENOGRAM HEPATIC WO HEMODYNAMIC EVALUATION  10/14/2017   MALONEY DILATION N/A 05/03/2019   Procedure: AGAPITO DILATION;  Surgeon: Shaaron Lamar HERO, MD;  [facility];  Service: Endoscopy;  Laterality: N/A;   POLYPECTOMY  03/06/2017   Procedure: POLYPECTOMY;  Surgeon: Shaaron Lamar HERO, MD;  [facility];  Service: Endoscopy;;  ascending colon;   POLYPECTOMY  04/23/2022   Procedure: POLYPECTOMY INTESTINAL;  Surgeon: Cindie Carlin POUR, DO;  [facility];  Service: Endoscopy;;    Current Outpatient Medications  Medication Sig Dispense Refill   Accu-Chek Softclix Lancets lancets Test BS 4 (four) times daily. Dx E11.69 400 each 3   aspirin  EC 81 MG tablet Take 1 tablet (81 mg total) by mouth daily. Swallow whole. 90 tablet 1   atorvastatin  (LIPITOR) 10 MG tablet Take 1 tablet (10 mg total) by mouth daily. 90 tablet 1   Blood Glucose Monitoring Suppl DEVI 1 each by Does not apply route in the morning, at noon, and at bedtime. May substitute to any manufacturer covered by patient's insurance. 1 each 0   Continuous Glucose Receiver (DEXCOM G7 RECEIVER) DEVI 1 Device by Does not apply route 3 (three) times daily as needed. 1 each 3   Continuous Glucose Sensor (DEXCOM G7 SENSOR) MISC 1 Application by Does not apply route every 14 (fourteen) days. 6 each  2   dexlansoprazole  (DEXILANT ) 60 MG capsule Take 1 capsule by mouth once daily 90 capsule 1   escitalopram  (LEXAPRO ) 20 MG tablet Take 1 tablet by mouth once daily 90 tablet 0   FARXIGA  10 MG TABS tablet TAKE 1 TABLET BY MOUTH ONCE DAILY BEFORE BREAKFAST 90 tablet 0   fluticasone  (FLONASE ) 50 MCG/ACT nasal spray Place 2 sprays into both nostrils daily as needed for allergies. 15.8 mL 3   furosemide  (LASIX ) 20 MG tablet Take 1 tablet (20 mg total) by mouth daily. 90 tablet 3   gabapentin  (NEURONTIN ) 800 MG tablet TAKE 1 TABLET BY MOUTH THREE TIMES DAILY 270 tablet 1   Insulin  Pen Needle (B-D UF III MINI PEN NEEDLES) 31G X 5 MM MISC USE WITH TRESIBA  DAILY 100 each 3   metFORMIN  (GLUCOPHAGE -XR) 750 MG 24 hr tablet TAKE 2 TABLETS BY MOUTH ONCE DAILY WITH BREAKFAST 180 tablet 0   nadolol  (CORGARD ) 40 MG tablet Take 1 tablet (40 mg total) by mouth daily. 90 tablet 3   nystatin  (MYCOSTATIN /NYSTOP ) powder APPLY POWDER TOPICALLY TO AFFECTED AREA 3 TIMES DAILY 240 g 3   nystatin  cream (MYCOSTATIN ) APPLY  CREAM TOPICALLY TO AFFECTED AREA TWICE DAILY  60 g 3   rifaximin  (XIFAXAN ) 550 MG TABS tablet Take 1 tablet (550 mg total) by mouth 2 (two) times daily. 180 tablet 3   Semaglutide ,0.25 or 0.5MG /DOS, (OZEMPIC , 0.25 OR 0.5 MG/DOSE,) 2 MG/3ML SOPN Inject 0.25 mg into the skin once a week. E11.42 3 mL 1   spironolactone  (ALDACTONE ) 50 MG tablet Take 1 tablet (50 mg total) by mouth daily. 90 tablet 3   TRESIBA  FLEXTOUCH 100 UNIT/ML FlexTouch Pen INJECT 25 UNITS SUBCUTANEOUSLY AT BEDTIME 15 mL 0   VITAMIN D  PO Take 1 capsule by mouth daily.      diclofenac  Sodium (VOLTAREN ) 1 % GEL Apply 2 g topically 4 (four) times daily. (Patient not taking: Reported on 05/26/2024) 100 g 0   No current facility-administered medications for this visit.    Allergies as of 05/26/2024   (No Known Allergies)    Family History  Problem Relation Age of Onset   COPD Mother    Liver disease Neg Hx    Colon cancer Neg Hx     Colon polyps Neg Hx     Social History   Socioeconomic History   Marital status: Divorced    Spouse name: Not on file   Number of children: Not on file   Years of education: Not on file   Highest education level: Not on file  Occupational History   Occupation: unemployed    Comment: disability  Tobacco Use   Smoking status: Never   Smokeless tobacco: Never  Vaping Use   Vaping status: Never Used  Substance and Sexual Activity   Alcohol use: No    Comment: Quit September 2017, heavy alcohol abuse previously   Drug use: No   Sexual activity: Not Currently    Partners: Female  Other Topics Concern   Not on file  Social History Narrative   Lives in [place], [place] with his youngest daughter; Is a retired education administrator. Is on disability for cirrhosis.    Drank alcohol for years, quit 05/07/16. Has been sober since then. Quit b/c of health/cirrhosis.   Divorced for over 15 years.   Dating sporadically.   Has 3 daughters and 1 son live locally.   Originally from [place] , moved here b/c of wife.    Eats all foods.    Wear seatbelt.   Does not drive, does not have a license, lost due to DWI.    Brought by neighbor.    Does not go to church. Believes in God. Prays each night, per report.    Social Drivers of Corporate Investment Banker Strain: Low Risk  (11/20/2022)   Overall Financial Resource Strain (CARDIA)    Difficulty of Paying Living Expenses: Not hard at all  Food Insecurity: No Food Insecurity (11/20/2022)   Hunger Vital Sign    Worried About Running Out of Food in the Last Year: Never true    Ran Out of Food in the Last Year: Never true  Transportation Needs: No Transportation Needs (11/20/2022)   PRAPARE - Administrator, Civil Service (Medical): No    Lack of Transportation (Non-Medical): No  Physical Activity: Sufficiently Active (11/20/2022)   Exercise Vital Sign    Days of Exercise per Week: 5 days    Minutes of Exercise per Session: 30 min  Stress: No Stress  Concern Present (11/20/2022)   [employer] of Occupational Health - Occupational Stress Questionnaire    Feeling of Stress : Not at all  Social Connections: Socially Isolated (11/20/2022)  Social Advertising Account Executive    Frequency of Communication with Friends and Family: More than three times a week    Frequency of Social Gatherings with Friends and Family: More than three times a week    Attends Religious Services: Never    Database Administrator or Organizations: No    Attends Banker Meetings: Never    Marital Status: Divorced  Catering Manager Violence: Not At Risk (11/20/2022)   Humiliation, Afraid, Rape, and Kick questionnaire    Fear of Current or Ex-Partner: No    Emotionally Abused: No    Physically Abused: No    Sexually Abused: No     Review of Systems   Gen: Denies any fever, chills, fatigue, weight loss, lack of appetite.  CV: Denies chest pain, heart palpitations, peripheral edema, syncope.  Resp: Denies shortness of breath at rest or with exertion. Denies wheezing or cough.  GI: Denies dysphagia or odynophagia. Denies jaundice, hematemesis, fecal incontinence. GU : Denies urinary burning, urinary frequency, urinary hesitancy MS: Denies joint pain, muscle weakness, cramps, or limitation of movement.  Derm: Denies rash, itching, dry skin Psych: Denies depression, anxiety, memory loss, and confusion Heme: Denies bruising, bleeding, and enlarged lymph nodes.   Physical Exam   BP 119/69   Pulse 60   Temp 97.9 F (36.6 C)   Ht 5' 8 (1.727 m)   Wt 168 lb 4.8 oz (76.3 kg)   BMI 25.59 kg/m  General:   Alert and oriented. Pleasant and cooperative. Well-nourished and well-developed.  Head:  Normocephalic and atraumatic. Eyes:  Without icterus Abdomen:  +BS, soft, non-tender and non-distended. No HSM noted. No guarding or rebound. No masses appreciated.  Rectal:  Deferred  Msk:  Symmetrical without gross deformities. Normal  posture. Extremities:  Without edema. Neurologic:  Alert and  oriented x4;  grossly normal neurologically. Skin:  Intact without significant lesions or rashes. Psych:  Alert and cooperative. Normal mood and affect.   Assessment   Owens Hara is a 63 y.o. male presenting today with a history of cirrhosis presumably due to prior alcohol abuse and previously followed by [facility] with biopsies showing cirrhosis due to ETOH and does have underlying hepatic sarcoidosis but no pulmonary involvement, presenting today for routine follow-up.  Cirrhosis: complicated in past by varices, ascites, encephalopathy, now doing remarkably well and following locally here with plans to return to [facility] only if decompensation and increased MELD.  ~Grade 1 varices in 2023 and on Nadolol  with good control of HR.  ~Continues with US  and AFP serially, which is now due.  ~Low dose diuretics continues without ascites or edema, ~Continues with Xifaxan  and no lactulose  as already has 3 BMs daily ~Immune to Hep A and B ~Due for variceal surveillance now, US , MELD labs, AFP, PEth   Dysphagia: in setting of chronic GERD, currently on Dexilant . Previous empiric dilation in the past with improvement.   Hx of Vit D deficiency: improved and on supplemental Vit D daily. At risk for osteopenia in setting of cirrhosis. Will need to update Vit D levels in future and consider DEXA.        PLAN    Proceed with upper endoscopy/dilation by Dr. Shaaron in near future: the risks, benefits, and alternatives have been discussed with the patient in detail. The patient states understanding and desires to proceed.  RUQ US , MELD labs, AFP, PEth Continue diuretic dosing Continue Xifaxan  BID Holding GLP1 a week prior 6 month follow-up: check Vit D  at that time, consider DEXA Colonoscopy 2028   Therisa MICAEL Stager, PhD, ANP-BC [facility] Gastroenterology

## 2024-05-26 NOTE — Telephone Encounter (Signed)
 LMOVM to call back to give US  appt details (11/17, arrival 9:15am, npo midnight) and to schedule EGD/DIL with Dr. Shaaron, ASA 3 (no ozempic  x 1 week, no farxiga  x 3 days prior, no metformin  day of)

## 2024-05-26 NOTE — Patient Instructions (Signed)
 We are arranging the ultrasound of your liver, and I have ordered routine blood work for you.  We are also arranging an upper endoscopy with possible dilation if needed!  You will need to hold Ozempic  one week before the procedure, and no metformin  or Farxiga  the day of the procedure.  I will see you in 6 months regardless!  I enjoyed seeing you again today! I value our relationship and want to provide genuine, compassionate, and quality care. You may receive a survey regarding your visit with me, and I welcome your feedback! Thanks so much for taking the time to complete this. I look forward to seeing you again.      Therisa MICAEL Stager, PhD, ANP-BC [facility] Gastroenterology

## 2024-05-26 NOTE — H&P (View-Only) (Signed)
 Gastroenterology Office Note     Primary Care Physician:  Lavell Bari LABOR, FNP  Primary Gastroenterologist: Dr. Shaaron    Chief Complaint   Chief Complaint  Patient presents with   Follow-up    Pt arrives for follow up. States things are going ok. GERD is slowly getting better. Having some pain in left side of abdomen.      History of Present Illness   Thomas Pratt is a 63 y.o. male presenting today with a history of cirrhosis presumably due to prior alcohol abuse (sober since 2017) however, he had positive AMA, AMA, ASMA (on repeat negative), elevated IgG, liver biopsy with granulomatous hepatitis. Evaluated by [facility] liver transplant center.  Labs repeated and they reviewed liver biopsy slides and suspected cirrhosis secondary to alcohol use but did have hepatic sarcoidosis.  Pulmonary work-up negative for lung involvement.  [place]  was stopped by [facility] liver, esophageal varices on Nadolol , last seen by [facility] July 2024 with recs to follow locally for MELD and PETH. Only return to [place] if decompensation (not rise in MELD). Last seen in April 2025 for routine visit.    No abdominal pain, N/V, changes in bowel habits, constipation, diarrhea, overt GI bleeding, GERD, unexplained weight loss, lack of appetite, unexplained weight gain. No mental status changes, confusion, jaundice, pruritus. Continues to abstain from ETOH sinc 2017. PEth continues to be negative.   Diuretics: lasix  20 mg and aldactone  50 mg,Rare lower extremity edema. Tolerating this well Xifaxan  BID. BM about 3 times a day, not taking lactulose  as has 3 soft BMs daily without this.   Feels at times like food sometimes hangs in esophagus. Dexilant  daily for GERD.    GI/HEPATOLOGY HEALTHCARE MAINTENANCE  HAV immunity: immune, 2017 HBV immunity: Immune, labs 2023 Pneumonia vaccinations: Recommend  Influenza vaccination: Recommend  COVID vaccination: unknown  Variceal screening: last in Oct 2023 Grade 1  varices, due in Oct 2025 HCC screening (imaging/AFP): US  abdomen April 2025 no HCC, AFP normal April 2025 Varices prophylaxis: Nadolol  40 mg daily, HR 60 SBP prophylaxis: N/A Diuretics: lasix  20 mg daily and aldactone  50 mg daily Colonoscopy: surveillance in 2028 DEXA scan: needs in future due to increased risk of osteopenia in patients with cirrhosis, VIt D deficiency in past, last year 75, needs updated.    Colonoscopy 04/2022: -nonbleeding internal hemorrhoids -four 4-77mm polyps removed. -tubular adenomas -next colonoscopy 5 years   EGD 04/2022: -grade I esophageal varices -gastritis -small hh -repeat EGD in two years  Past Medical History:  Diagnosis Date   Anxiety    Cirrhosis (HCC)    likely ETOH use    Depression    Diabetes (HCC)    GERD (gastroesophageal reflux disease)    Heat stroke 2016/17   Hypertension     Past Surgical History:  Procedure Laterality Date   CHOLECYSTECTOMY N/A 09/01/2017   Procedure: LAPAROSCOPIC CHOLECYSTECTOMY;  Surgeon: Mavis Anes, MD;  Location: [facility];  Service: General;  Laterality: N/A;   COLONOSCOPY WITH PROPOFOL  N/A 03/06/2017   Dr. Shaaron: tubular adenoma removed, next TCS 5 years if health permits.    COLONOSCOPY WITH PROPOFOL  N/A 04/23/2022   Procedure: COLONOSCOPY WITH PROPOFOL ;  Surgeon: Cindie Carlin POUR, DO;  Location: [facility];  Service: Endoscopy;  Laterality: N/A;  10:30am, asa 3   ESOPHAGOGASTRODUODENOSCOPY (EGD) WITH PROPOFOL  N/A 03/06/2017   Dr. Shaaron: grade 1 esophageal varices, erosive esophagitis, portal gastropathy.    ESOPHAGOGASTRODUODENOSCOPY (EGD) WITH PROPOFOL  N/A 05/03/2019   Procedure: ESOPHAGOGASTRODUODENOSCOPY (  EGD) WITH PROPOFOL ;  Surgeon: Shaaron Lamar HERO, MD;  Location: [facility];  Service: Endoscopy;  Laterality: N/A;  10:30am   ESOPHAGOGASTRODUODENOSCOPY (EGD) WITH PROPOFOL  N/A 12/23/2019   Procedure: ESOPHAGOGASTRODUODENOSCOPY (EGD) WITH PROPOFOL  Possible esophageal variceal banding.;  Surgeon:  Shaaron Lamar HERO, MD;  [facility]: [facility];  Service: Endoscopy;  Laterality: N/A;   ESOPHAGOGASTRODUODENOSCOPY (EGD) WITH PROPOFOL  N/A 04/23/2022   Procedure: ESOPHAGOGASTRODUODENOSCOPY (EGD) WITH PROPOFOL ;  Surgeon: Cindie Carlin POUR, DO;  [facility];  Service: Endoscopy;  Laterality: N/A;   HERNIA REPAIR Right    RIH   INGUINAL HERNIA REPAIR Right 07/11/2017   Procedure: HERNIA REPAIR INGUINAL ADULT WITH MESH;  Surgeon: Mavis Anes, MD;  Location: [facility];  Service: General;  Laterality: Right;  patient knows to arrive at 7:30   IR TRANSCATHETER BX  10/14/2017   IR US  GUIDE VASC ACCESS RIGHT  10/14/2017   IR VENOGRAM HEPATIC WO HEMODYNAMIC EVALUATION  10/14/2017   MALONEY DILATION N/A 05/03/2019   Procedure: AGAPITO DILATION;  Surgeon: Shaaron Lamar HERO, MD;  [facility];  Service: Endoscopy;  Laterality: N/A;   POLYPECTOMY  03/06/2017   Procedure: POLYPECTOMY;  Surgeon: Shaaron Lamar HERO, MD;  [facility];  Service: Endoscopy;;  ascending colon;   POLYPECTOMY  04/23/2022   Procedure: POLYPECTOMY INTESTINAL;  Surgeon: Cindie Carlin POUR, DO;  [facility];  Service: Endoscopy;;    Current Outpatient Medications  Medication Sig Dispense Refill   Accu-Chek Softclix Lancets lancets Test BS 4 (four) times daily. Dx E11.69 400 each 3   aspirin  EC 81 MG tablet Take 1 tablet (81 mg total) by mouth daily. Swallow whole. 90 tablet 1   atorvastatin  (LIPITOR) 10 MG tablet Take 1 tablet (10 mg total) by mouth daily. 90 tablet 1   Blood Glucose Monitoring Suppl DEVI 1 each by Does not apply route in the morning, at noon, and at bedtime. May substitute to any manufacturer covered by patient's insurance. 1 each 0   Continuous Glucose Receiver (DEXCOM G7 RECEIVER) DEVI 1 Device by Does not apply route 3 (three) times daily as needed. 1 each 3   Continuous Glucose Sensor (DEXCOM G7 SENSOR) MISC 1 Application by Does not apply route every 14 (fourteen) days. 6 each  2   dexlansoprazole  (DEXILANT ) 60 MG capsule Take 1 capsule by mouth once daily 90 capsule 1   escitalopram  (LEXAPRO ) 20 MG tablet Take 1 tablet by mouth once daily 90 tablet 0   FARXIGA  10 MG TABS tablet TAKE 1 TABLET BY MOUTH ONCE DAILY BEFORE BREAKFAST 90 tablet 0   fluticasone  (FLONASE ) 50 MCG/ACT nasal spray Place 2 sprays into both nostrils daily as needed for allergies. 15.8 mL 3   furosemide  (LASIX ) 20 MG tablet Take 1 tablet (20 mg total) by mouth daily. 90 tablet 3   gabapentin  (NEURONTIN ) 800 MG tablet TAKE 1 TABLET BY MOUTH THREE TIMES DAILY 270 tablet 1   Insulin  Pen Needle (B-D UF III MINI PEN NEEDLES) 31G X 5 MM MISC USE WITH TRESIBA  DAILY 100 each 3   metFORMIN  (GLUCOPHAGE -XR) 750 MG 24 hr tablet TAKE 2 TABLETS BY MOUTH ONCE DAILY WITH BREAKFAST 180 tablet 0   nadolol  (CORGARD ) 40 MG tablet Take 1 tablet (40 mg total) by mouth daily. 90 tablet 3   nystatin  (MYCOSTATIN /NYSTOP ) powder APPLY POWDER TOPICALLY TO AFFECTED AREA 3 TIMES DAILY 240 g 3   nystatin  cream (MYCOSTATIN ) APPLY  CREAM TOPICALLY TO AFFECTED AREA TWICE DAILY  60 g 3   rifaximin  (XIFAXAN ) 550 MG TABS tablet Take 1 tablet (550 mg total) by mouth 2 (two) times daily. 180 tablet 3   Semaglutide ,0.25 or 0.5MG /DOS, (OZEMPIC , 0.25 OR 0.5 MG/DOSE,) 2 MG/3ML SOPN Inject 0.25 mg into the skin once a week. E11.42 3 mL 1   spironolactone  (ALDACTONE ) 50 MG tablet Take 1 tablet (50 mg total) by mouth daily. 90 tablet 3   TRESIBA  FLEXTOUCH 100 UNIT/ML FlexTouch Pen INJECT 25 UNITS SUBCUTANEOUSLY AT BEDTIME 15 mL 0   VITAMIN D  PO Take 1 capsule by mouth daily.      diclofenac  Sodium (VOLTAREN ) 1 % GEL Apply 2 g topically 4 (four) times daily. (Patient not taking: Reported on 05/26/2024) 100 g 0   No current facility-administered medications for this visit.    Allergies as of 05/26/2024   (No Known Allergies)    Family History  Problem Relation Age of Onset   COPD Mother    Liver disease Neg Hx    Colon cancer Neg Hx     Colon polyps Neg Hx     Social History   Socioeconomic History   Marital status: Divorced    Spouse name: Not on file   Number of children: Not on file   Years of education: Not on file   Highest education level: Not on file  Occupational History   Occupation: unemployed    Comment: disability  Tobacco Use   Smoking status: Never   Smokeless tobacco: Never  Vaping Use   Vaping status: Never Used  Substance and Sexual Activity   Alcohol use: No    Comment: Quit September 2017, heavy alcohol abuse previously   Drug use: No   Sexual activity: Not Currently    Partners: Female  Other Topics Concern   Not on file  Social History Narrative   Lives in [place], [place] with his youngest daughter; Is a retired education administrator. Is on disability for cirrhosis.    Drank alcohol for years, quit 05/07/16. Has been sober since then. Quit b/c of health/cirrhosis.   Divorced for over 15 years.   Dating sporadically.   Has 3 daughters and 1 son live locally.   Originally from [place] , moved here b/c of wife.    Eats all foods.    Wear seatbelt.   Does not drive, does not have a license, lost due to DWI.    Brought by neighbor.    Does not go to church. Believes in God. Prays each night, per report.    Social Drivers of Corporate Investment Banker Strain: Low Risk  (11/20/2022)   Overall Financial Resource Strain (CARDIA)    Difficulty of Paying Living Expenses: Not hard at all  Food Insecurity: No Food Insecurity (11/20/2022)   Hunger Vital Sign    Worried About Running Out of Food in the Last Year: Never true    Ran Out of Food in the Last Year: Never true  Transportation Needs: No Transportation Needs (11/20/2022)   PRAPARE - Administrator, Civil Service (Medical): No    Lack of Transportation (Non-Medical): No  Physical Activity: Sufficiently Active (11/20/2022)   Exercise Vital Sign    Days of Exercise per Week: 5 days    Minutes of Exercise per Session: 30 min  Stress: No Stress  Concern Present (11/20/2022)   [employer] of Occupational Health - Occupational Stress Questionnaire    Feeling of Stress : Not at all  Social Connections: Socially Isolated (11/20/2022)  Social Advertising Account Executive    Frequency of Communication with Friends and Family: More than three times a week    Frequency of Social Gatherings with Friends and Family: More than three times a week    Attends Religious Services: Never    Database Administrator or Organizations: No    Attends Banker Meetings: Never    Marital Status: Divorced  Catering Manager Violence: Not At Risk (11/20/2022)   Humiliation, Afraid, Rape, and Kick questionnaire    Fear of Current or Ex-Partner: No    Emotionally Abused: No    Physically Abused: No    Sexually Abused: No     Review of Systems   Gen: Denies any fever, chills, fatigue, weight loss, lack of appetite.  CV: Denies chest pain, heart palpitations, peripheral edema, syncope.  Resp: Denies shortness of breath at rest or with exertion. Denies wheezing or cough.  GI: Denies dysphagia or odynophagia. Denies jaundice, hematemesis, fecal incontinence. GU : Denies urinary burning, urinary frequency, urinary hesitancy MS: Denies joint pain, muscle weakness, cramps, or limitation of movement.  Derm: Denies rash, itching, dry skin Psych: Denies depression, anxiety, memory loss, and confusion Heme: Denies bruising, bleeding, and enlarged lymph nodes.   Physical Exam   BP 119/69   Pulse 60   Temp 97.9 F (36.6 C)   Ht 5' 8 (1.727 m)   Wt 168 lb 4.8 oz (76.3 kg)   BMI 25.59 kg/m  General:   Alert and oriented. Pleasant and cooperative. Well-nourished and well-developed.  Head:  Normocephalic and atraumatic. Eyes:  Without icterus Abdomen:  +BS, soft, non-tender and non-distended. No HSM noted. No guarding or rebound. No masses appreciated.  Rectal:  Deferred  Msk:  Symmetrical without gross deformities. Normal  posture. Extremities:  Without edema. Neurologic:  Alert and  oriented x4;  grossly normal neurologically. Skin:  Intact without significant lesions or rashes. Psych:  Alert and cooperative. Normal mood and affect.   Assessment   Thomas Pratt is a 63 y.o. male presenting today with a history of cirrhosis presumably due to prior alcohol abuse and previously followed by [facility] with biopsies showing cirrhosis due to ETOH and does have underlying hepatic sarcoidosis but no pulmonary involvement, presenting today for routine follow-up.  Cirrhosis: complicated in past by varices, ascites, encephalopathy, now doing remarkably well and following locally here with plans to return to [facility] only if decompensation and increased MELD.  ~Grade 1 varices in 2023 and on Nadolol  with good control of HR.  ~Continues with US  and AFP serially, which is now due.  ~Low dose diuretics continues without ascites or edema, ~Continues with Xifaxan  and no lactulose  as already has 3 BMs daily ~Immune to Hep A and B ~Due for variceal surveillance now, US , MELD labs, AFP, PEth   Dysphagia: in setting of chronic GERD, currently on Dexilant . Previous empiric dilation in the past with improvement.   Hx of Vit D deficiency: improved and on supplemental Vit D daily. At risk for osteopenia in setting of cirrhosis. Will need to update Vit D levels in future and consider DEXA.        PLAN    Proceed with upper endoscopy/dilation by Dr. Shaaron in near future: the risks, benefits, and alternatives have been discussed with the patient in detail. The patient states understanding and desires to proceed.  RUQ US , MELD labs, AFP, PEth Continue diuretic dosing Continue Xifaxan  BID Holding GLP1 a week prior 6 month follow-up: check Vit D  at that time, consider DEXA Colonoscopy 2028   Therisa MICAEL Stager, PhD, ANP-BC [facility] Gastroenterology

## 2024-05-27 ENCOUNTER — Encounter: Payer: Self-pay | Admitting: *Deleted

## 2024-05-27 LAB — AFP TUMOR MARKER: AFP, Serum, Tumor Marker: 1.9 ng/mL (ref 0.0–8.4)

## 2024-05-27 NOTE — Telephone Encounter (Signed)
 Pt informed of US  appointment. Pt scheduled for procedure on 06/23/24, instructions mailed.  [employer] PA:  Notification or Prior Authorization is not required for the requested services You are not required to submit a notification/prior authorization based on the information provided. If you have general questions about the prior authorization requirements, visit [email or web address] > Clinician Resources > Advance and Admission Notification Requirements. The number above acknowledges your notification. Please write this reference number down for future reference. If you would like to request an organization determination, please call us  at [phone]. Decision ID #: [id number]

## 2024-06-01 ENCOUNTER — Other Ambulatory Visit (HOSPITAL_COMMUNITY): Payer: Self-pay

## 2024-06-01 LAB — MISC LABCORP TEST (SEND OUT): Labcorp test code: 791584

## 2024-06-07 ENCOUNTER — Ambulatory Visit (HOSPITAL_COMMUNITY)
Admission: RE | Admit: 2024-06-07 | Discharge: 2024-06-07 | Disposition: A | Discharge status: Home / Self Care | Source: Ambulatory Visit | Attending: Gastroenterology | Admitting: Gastroenterology

## 2024-06-07 DIAGNOSIS — K7460 Unspecified cirrhosis of liver: Secondary | ICD-10-CM | POA: Diagnosis present

## 2024-06-09 ENCOUNTER — Telehealth: Payer: Self-pay | Admitting: Pharmacy Technician

## 2024-06-09 ENCOUNTER — Other Ambulatory Visit (HOSPITAL_COMMUNITY): Payer: Self-pay

## 2024-06-09 NOTE — Telephone Encounter (Signed)
 Pharmacy Patient Advocate Encounter   Received notification from Onbase that prior authorization for Xifaxan  550 mg tablet is required/requested.   Insurance verification completed.   The patient is insured through [facility].   Per test claim: PA required; PA submitted to above mentioned insurance via Latent Key/confirmation #/EOC [id number] Status is pending

## 2024-06-10 ENCOUNTER — Other Ambulatory Visit (HOSPITAL_COMMUNITY): Payer: Self-pay

## 2024-06-10 NOTE — Telephone Encounter (Signed)
 Pharmacy Patient Advocate Encounter  Received notification from [employer] that Prior Authorization for Xifaxan  550MG  tablets has been DENIED.  Full denial letter will be uploaded to the media tab. See denial reason below.    PA #/Case ID/Reference #: [id number]

## 2024-06-11 NOTE — Progress Notes (Deleted)
 06/11/2024 Name: Calem Cocozza MRN: [id number] DOB: [birth year]  No chief complaint on file.   Josephus Harriger is a 63 y.o. year old male who was referred for medication management by their primary care provider, Lavell Bari LABOR, FNP. They presented for a telephone visit today. They were referred to the pharmacist by their PCP for assistance in managing diabetes    Subjective: Mallissa presents for a follow up telephone appointment for ***  Care Team: Primary Care Provider: Lavell Bari LABOR, FNP ; Next Scheduled Visit: 06/29/2024  Medication Access/Adherence Current Pharmacy:  [facility] [address], [place] - [address] [place] [address] Phone: [phone] Fax: [phone]  [facility]) - [place], [place] - [address] [facility] [address] [place] [address] [place] [address] Phone: [phone] Fax: [phone]   Patient reports affordability concerns with their medications: No  Patient reports access/transportation concerns to their pharmacy: Yes  - has transportation through [facility] and is limited to two rides per month Patient reports adherence concerns with their medications:  Yes  - he has been having difficulty remember which medications that he takes; often forgets to take medications  Diabetes: Current medications:  Farxiga  10 mg daily, Metformin  1500 mg, Ozempic  0.25mg  weekly *** Previous medications: Tresiba  (self-discontinued due to injection site pain) Statin: atorvastatin  10 mg daily (LF 08/2023) LDL of 95 on 08/26/23 ACEi/ARB: none UACR of <6 on 03/06/23, due for updated lab  A1c of 8.9% on 03/25/24, slightly increased from 8.8% on 01/01/24  DEXCOM G7--USES READER Date of Download: *** % Time CGM is active: ***% Average Glucose: *** mg/dL Glucose Management Indicator: ***  Glucose Variability: *** (goal <36%) Time in Goal:  - Time in range 70-180: ***% - Time above range:  ***% - Time below range: ***% Observed patterns:  Patient *** hypoglycemic s/sx including dizziness, shakiness, sweating. Patient *** hyperglycemic symptoms including polyuria, polydipsia, polyphagia, nocturia, neuropathy, blurred vision.  Current meal patterns:  Discussed meal planning options and Plate method for healthy eating Avoid sugary drinks and desserts Incorporate balanced protein, non starchy veggies, 1 serving of carbohydrate with each meal Increase water intake Increase physical activity as able    Objective: Lab Results  Component Value Date   HGBA1C 8.9 (H) 03/25/2024   Lab Results  Component Value Date   CREATININE 0.89 05/26/2024   BUN 11 05/26/2024   NA 139 05/26/2024   K 4.5 05/26/2024   CL 101 05/26/2024   CO2 30 05/26/2024   Lab Results  Component Value Date   CHOL 154 08/26/2023   HDL 39 (L) 08/26/2023   LDLCALC 95 08/26/2023   TRIG 111 08/26/2023   CHOLHDL 3.9 08/26/2023   Medications Reviewed Today   Medications were not reviewed in this encounter     Assessment/Plan:  Diabetes: Currently uncontrolled. Mallissa has difficulty remembering which medications that he is supposed to be taking. He states that he has a surplus of medications at home, however, he has not filled his prescriptions in many months.  Goal of <7% Reviewed long term cardiovascular and renal outcomes of uncontrolled blood sugar Reviewed goal A1c, goal fasting, and goal 2 hour post prandial glucose Recommend to:   *** Ozempic  at *** mg weekly Continue metformin  750 mg BID Continue atorvastatin  10 mg daily Contineu all other medications as prescribed Future Consideration:  Discussed pill packing with patient; will wait until stabilized on current medications before transitioning.  Potential to restart  basal insulin  at follow-up if uncontrolled Patient denies personal or family history of multiple endocrine neoplasia type 2, medullary thyroid  cancer; personal history of  pancreatitis or gallbladder disease. Recommend to check glucose continuously with CGM UACR collected    Follow Up Plan:  PCP 06/29/2024 PharmD ***  ***

## 2024-06-15 ENCOUNTER — Telehealth: Payer: Self-pay | Admitting: Pharmacist

## 2024-06-15 ENCOUNTER — Other Ambulatory Visit

## 2024-06-15 NOTE — Telephone Encounter (Signed)
 Appears patient picked up xifaxin on 05/25/24 Please ensure PA Is okay, please send to appeal if not Patient does have intolerance to lactulose  from a note back in 08/28/2018, however I have updated intolerance/allergy list

## 2024-06-15 NOTE — Telephone Encounter (Signed)
 Appeal has been submitted. Will advise when response is received or follow up in 1 week. Please be advised that most companies may take 30 days to make a decision. Appeal letter and supporting documentation have been faxed to [phone] on 06/15/2024 @1 :55 pm.  Thank you, Devere Pandy, PharmD Clinical Pharmacist  [place]  Direct Dial: [phone]

## 2024-06-15 NOTE — Telephone Encounter (Signed)
 Noted

## 2024-06-16 ENCOUNTER — Ambulatory Visit: Payer: Self-pay | Admitting: Gastroenterology

## 2024-06-18 ENCOUNTER — Other Ambulatory Visit: Payer: Self-pay | Admitting: Family

## 2024-06-20 ENCOUNTER — Encounter: Payer: Self-pay | Admitting: Gastroenterology

## 2024-06-21 ENCOUNTER — Other Ambulatory Visit (HOSPITAL_COMMUNITY): Payer: Self-pay

## 2024-06-21 NOTE — Telephone Encounter (Signed)
 Appeal for Xifaxan  has been approved through 07/21/2025.    Thank you, Devere Pandy, PharmD Clinical Pharmacist  [place]  Direct Dial: [phone]

## 2024-06-22 ENCOUNTER — Encounter (HOSPITAL_COMMUNITY)
Admission: RE | Admit: 2024-06-22 | Discharge: 2024-06-22 | Disposition: A | Source: Ambulatory Visit | Attending: Internal Medicine

## 2024-06-22 NOTE — Pre-Procedure Instructions (Signed)
 Office notified because we have been unable to reach patient.

## 2024-06-22 NOTE — Pre-Procedure Instructions (Signed)
 Attempted pre-op phone call. No answer and no VM to leave a message.

## 2024-06-23 ENCOUNTER — Ambulatory Visit (HOSPITAL_COMMUNITY): Admitting: Certified Registered"

## 2024-06-23 ENCOUNTER — Encounter (HOSPITAL_COMMUNITY): Payer: Self-pay | Admitting: Internal Medicine

## 2024-06-23 ENCOUNTER — Encounter (HOSPITAL_COMMUNITY): Admission: RE | Disposition: A | Payer: Self-pay | Source: Home / Self Care | Attending: Internal Medicine

## 2024-06-23 ENCOUNTER — Other Ambulatory Visit: Payer: Self-pay

## 2024-06-23 ENCOUNTER — Ambulatory Visit (HOSPITAL_COMMUNITY)
Admission: RE | Admit: 2024-06-23 | Discharge: 2024-06-23 | Disposition: A | Attending: Internal Medicine | Admitting: Internal Medicine

## 2024-06-23 DIAGNOSIS — K766 Portal hypertension: Secondary | ICD-10-CM | POA: Diagnosis not present

## 2024-06-23 DIAGNOSIS — K3189 Other diseases of stomach and duodenum: Secondary | ICD-10-CM | POA: Diagnosis not present

## 2024-06-23 DIAGNOSIS — R1310 Dysphagia, unspecified: Secondary | ICD-10-CM | POA: Diagnosis not present

## 2024-06-23 DIAGNOSIS — K7460 Unspecified cirrhosis of liver: Secondary | ICD-10-CM | POA: Diagnosis not present

## 2024-06-23 DIAGNOSIS — I10 Essential (primary) hypertension: Secondary | ICD-10-CM | POA: Diagnosis not present

## 2024-06-23 HISTORY — PX: ESOPHAGOGASTRODUODENOSCOPY: SHX5428

## 2024-06-23 HISTORY — PX: ESOPHAGEAL DILATION: SHX303

## 2024-06-23 LAB — GLUCOSE, CAPILLARY: Glucose-Capillary: 151 mg/dL — ABNORMAL HIGH (ref 70–99)

## 2024-06-23 SURGERY — EGD (ESOPHAGOGASTRODUODENOSCOPY)
Anesthesia: General

## 2024-06-23 MED ORDER — LACTATED RINGERS IV SOLN: INTRAVENOUS | Status: DC

## 2024-06-23 MED ORDER — LACTATED RINGERS IV SOLN: INTRAVENOUS | Status: DC | PRN

## 2024-06-23 MED ORDER — PROPOFOL 10 MG/ML IV BOLUS
INTRAVENOUS | Status: DC | PRN
  Administered 2024-06-23: 100 mg via INTRAVENOUS
  Administered 2024-06-23: 150 ug/kg/min via INTRAVENOUS

## 2024-06-23 MED ORDER — LIDOCAINE HCL (CARDIAC) PF 100 MG/5ML IV SOSY
PREFILLED_SYRINGE | INTRAVENOUS | Status: DC | PRN
  Administered 2024-06-23: 100 mg via INTRAVENOUS

## 2024-06-23 NOTE — Anesthesia Postprocedure Evaluation (Signed)
 Anesthesia Post Note  Patient: Thomas Pratt  Procedure(s) Performed: EGD (ESOPHAGOGASTRODUODENOSCOPY) DILATION, ESOPHAGUS  Patient location during evaluation: PACU Anesthesia Type: General Level of consciousness: awake and alert Pain management: pain level controlled Vital Signs Assessment: post-procedure vital signs reviewed and stable Respiratory status: spontaneous breathing, nonlabored ventilation and respiratory function stable Cardiovascular status: blood pressure returned to baseline and stable Postop Assessment: no apparent nausea or vomiting Anesthetic complications: no   No notable events documented.   Last Vitals:  Vitals:   06/23/24 1101 06/23/24 1110  BP: (!) 71/44 102/67  Pulse: 64   Resp: 14   Temp: 36.4 C   SpO2: 96%     Last Pain:  Vitals:   06/23/24 1110  TempSrc:   PainSc: 0-No pain                 Andrea Limes

## 2024-06-23 NOTE — Interval H&P Note (Signed)
 History and Physical Interval Note:  06/23/2024 10:40 AM  [name]  has presented today for surgery, with the diagnosis of cirrhosis,dysphagia.  The various methods of treatment have been discussed with the patient and family. After consideration of risks, benefits and other options for treatment, the patient has consented to  Procedure(s) with comments: EGD (ESOPHAGOGASTRODUODENOSCOPY) (N/A) - 9:45 am, asa 3 DILATION, ESOPHAGUS (N/A) as a surgical intervention.  The patient's history has been reviewed, patient examined, no change in status, stable for surgery.  I have reviewed the patient's chart and labs.  Questions were answered to the patient's satisfaction.     Ramond Darnell    No change.  Seen and examined in short stay.  Agree with need for EGD for further evaluation of dysphagia offered the patient an EGD with esophageal dilation as feasible/appropriate today per plan.  The risks, benefits, limitations, alternatives and imponderables have been reviewed with the patient. Potential for esophageal dilation, biopsy, etc. have also been reviewed.  Questions have been answered. All parties agreeable.

## 2024-06-23 NOTE — Op Note (Signed)
 [facility] Patient Name: Thomas Pratt Procedure Date: 06/23/2024 9:39 AM MRN: [id number] Date of Birth: [birth year] Attending MD: Lamar Ozell Hollingshead , MD, [id number] CSN: [id number] Age: 63 Admit Type: Outpatient Procedure:                Upper GI endoscopy Indications:              Dysphagia Providers:                Lamar Ozell Hollingshead, MD, Madelin Hunter, RN, Bascom Blush Referring MD:              Medicines:                Propofol  per Anesthesia Complications:            No immediate complications. Estimated Blood Loss:     Estimated blood loss: none. Procedure:                Pre-Anesthesia Assessment:                           - Prior to the procedure, a History and Physical                            was performed, and patient medications and                            allergies were reviewed. The patient's tolerance of                            previous anesthesia was also reviewed. The risks                            and benefits of the procedure and the sedation                            options and risks were discussed with the patient.                            All questions were answered, and informed consent                            was obtained. Prior Anticoagulants: The patient has                            taken no anticoagulant or antiplatelet agents. ASA                            Grade Assessment: III - A patient with severe                            systemic disease. After reviewing the risks and  benefits, the patient was deemed in satisfactory                            condition to undergo the procedure.                           After obtaining informed consent, the endoscope was                            passed under direct vision. Throughout the                            procedure, the patient's blood pressure, pulse, and                            oxygen  saturations were monitored  continuously. The                            [id number]-[id number] ([id number]) Upper was introduced through                            the mouth, and advanced to the second part of                            duodenum. The upper GI endoscopy was accomplished                            without difficulty. The patient tolerated the                            procedure well. Scope In: 10:50:49 AM Scope Out: 10:57:55 AM Total Procedure Duration: 0 hours 7 minutes 6 seconds  Findings:      The examined esophagus was normal. No varices identified today. The       scope was withdrawn. Dilation was performed with a Maloney dilator with       mild resistance at 56 Fr. The dilation site was examined following       endoscope reinsertion and showed no change. Estimated blood loss: none.      Mild portal hypertensive gastropathy was found in the gastric body.      The duodenal bulb and second portion of the duodenum were normal. Impression:               - Normal esophagus. Dilated.                           - Portal hypertensive gastropathy.                           - Normal duodenal bulb and second portion of the                            duodenum.                           - No specimens collected. Moderate Sedation:  Moderate (conscious) sedation was personally administered by an       anesthesia professional. The following parameters were monitored: oxygen        saturation, heart rate, blood pressure, respiratory rate, EKG, adequacy       of pulmonary ventilation, and response to care. Recommendation:           - Patient has a contact number available for                            emergencies. The signs and symptoms of potential                            delayed complications were discussed with the                            patient. Return to normal activities tomorrow.                            Written discharge instructions were provided to the                            patient.                            - Advance diet as tolerated.                           - Continue present medications.                           - Return to my office in 5 months. Procedure Code(s):        --- Professional ---                           [phone], Esophagogastroduodenoscopy, flexible,                            transoral; diagnostic, including collection of                            specimen(s) by brushing or washing, when performed                            (separate procedure)                           43450, Dilation of esophagus, by unguided sound or                            bougie, single or multiple passes Diagnosis Code(s):        --- Professional ---                           K76.6, Portal hypertension                           K31.89, Other diseases of  stomach and duodenum                           R13.10, Dysphagia, unspecified CPT copyright 2022 American Medical Association. All rights reserved. The codes documented in this report are preliminary and upon coder review may  be revised to meet current compliance requirements. Lamar HERO. Anasha Perfecto, MD Lamar Ozell Hollingshead, MD 06/23/2024 11:06:35 AM This report has been signed electronically. Number of Addenda: 0

## 2024-06-23 NOTE — Discharge Instructions (Addendum)
 EGD Discharge instructions Please read the instructions outlined below and refer to this sheet in the next few weeks. These discharge instructions provide you with general information on caring for yourself after you leave the hospital. Your doctor may also give you specific instructions. While your treatment has been planned according to the most current medical practices available, unavoidable complications occasionally occur. If you have any problems or questions after discharge, please call your doctor. ACTIVITY You may resume your regular activity but move at a slower pace for the next 24 hours.  Take frequent rest periods for the next 24 hours.  Walking will help expel (get rid of) the air and reduce the bloated feeling in your abdomen.  No driving for 24 hours (because of the anesthesia (medicine) used during the test).  You may shower.  Do not sign any important legal documents or operate any machinery for 24 hours (because of the anesthesia used during the test).  NUTRITION Drink plenty of fluids.  You may resume your normal diet.  Begin with a light meal and progress to your normal diet.  Avoid alcoholic beverages for 24 hours or as instructed by your caregiver.  MEDICATIONS You may resume your normal medications unless your caregiver tells you otherwise.  WHAT YOU CAN EXPECT TODAY You may experience abdominal discomfort such as a feeling of fullness or "gas" pains.  FOLLOW-UP Your doctor will discuss the results of your test with you.  SEEK IMMEDIATE MEDICAL ATTENTION IF ANY OF THE FOLLOWING OCCUR: Excessive nausea (feeling sick to your stomach) and/or vomiting.  Severe abdominal pain and distention (swelling).  Trouble swallowing.  Temperature over 101 F (37.8 C).  Rectal bleeding or vomiting of blood.     Your esophagus looked good today.  I did dilate your esophagus.  Advance your diet as tolerated  Office visit with us  in about 5 months as scheduled.  At patient  request, I called Dawn at [phone] -no contact

## 2024-06-23 NOTE — Transfer of Care (Signed)
 Immediate Anesthesia Transfer of Care Note  Patient: Thomas Pratt  Procedure(s) Performed: EGD (ESOPHAGOGASTRODUODENOSCOPY) DILATION, ESOPHAGUS  Patient Location: Short Stay  Anesthesia Type:General  Level of Consciousness: drowsy, patient cooperative, and responds to stimulation  Airway & Oxygen  Therapy: Patient Spontanous Breathing  Post-op Assessment: Report given to RN and Post -op Vital signs reviewed and stable  Post vital signs: Reviewed and stable  Last Vitals:  Vitals Value Taken Time  BP 71/44 06/23/24 11:01  Temp 36.4 C 06/23/24 11:01  Pulse 64 06/23/24 11:01  Resp 14 06/23/24 11:01  SpO2 96 % 06/23/24 11:01    Last Pain:  Vitals:   06/23/24 1101  TempSrc: Oral  PainSc: Asleep         Complications: No notable events documented.

## 2024-06-23 NOTE — Anesthesia Preprocedure Evaluation (Addendum)
 Anesthesia Evaluation  Patient identified by MRN, date of birth, ID band Patient awake    Reviewed: Allergy & Precautions, H&P , NPO status , Patient's Chart, lab work & pertinent test results  Airway Mallampati: II  TM Distance: >3 FB Neck ROM: Full    Dental  (+) Missing   Pulmonary neg pulmonary ROS   Pulmonary exam normal breath sounds clear to auscultation       Cardiovascular hypertension, Normal cardiovascular exam Rhythm:Regular Rate:Normal     Neuro/Psych  PSYCHIATRIC DISORDERS Anxiety Depression     Neuromuscular disease    GI/Hepatic ,GERD  ,,(+) Cirrhosis   Esophageal Varices    , Hepatitis -  Endo/Other  diabetes    Renal/GU negative Renal ROS  negative genitourinary   Musculoskeletal negative musculoskeletal ROS (+)    Abdominal   Peds negative pediatric ROS (+)  Hematology negative hematology ROS (+)   Anesthesia Other Findings   Reproductive/Obstetrics negative OB ROS                              Anesthesia Physical Anesthesia Plan  ASA: 4  Anesthesia Plan: General   Post-op Pain Management:    Induction: Intravenous  PONV Risk Score and Plan:   Airway Management Planned: Nasal Cannula  Additional Equipment:   Intra-op Plan:   Post-operative Plan:   Informed Consent: I have reviewed the patients History and Physical, chart, labs and discussed the procedure including the risks, benefits and alternatives for the proposed anesthesia with the patient or authorized representative who has indicated his/her understanding and acceptance.     Dental advisory given  Plan Discussed with: CRNA  Anesthesia Plan Comments:          Anesthesia Quick Evaluation

## 2024-06-25 ENCOUNTER — Encounter (HOSPITAL_COMMUNITY): Payer: Self-pay | Admitting: Internal Medicine

## 2024-06-29 ENCOUNTER — Ambulatory Visit: Payer: Self-pay | Admitting: Family

## 2024-07-12 ENCOUNTER — Ambulatory Visit: Admitting: Family

## 2024-07-12 ENCOUNTER — Encounter: Payer: Self-pay | Admitting: Family

## 2024-07-12 VITALS — BP 130/62 | HR 65 | Temp 98.1°F | Ht 68.0 in | Wt 165.0 lb

## 2024-07-12 DIAGNOSIS — E785 Hyperlipidemia, unspecified: Secondary | ICD-10-CM | POA: Diagnosis not present

## 2024-07-12 DIAGNOSIS — E1142 Type 2 diabetes mellitus with diabetic polyneuropathy: Secondary | ICD-10-CM | POA: Diagnosis not present

## 2024-07-12 DIAGNOSIS — Z23 Encounter for immunization: Secondary | ICD-10-CM | POA: Diagnosis not present

## 2024-07-12 DIAGNOSIS — K7030 Alcoholic cirrhosis of liver without ascites: Secondary | ICD-10-CM

## 2024-07-12 DIAGNOSIS — F320 Major depressive disorder, single episode, mild: Secondary | ICD-10-CM | POA: Diagnosis not present

## 2024-07-12 DIAGNOSIS — E1169 Type 2 diabetes mellitus with other specified complication: Secondary | ICD-10-CM

## 2024-07-12 DIAGNOSIS — I10 Essential (primary) hypertension: Secondary | ICD-10-CM | POA: Diagnosis not present

## 2024-07-12 DIAGNOSIS — K219 Gastro-esophageal reflux disease without esophagitis: Secondary | ICD-10-CM | POA: Diagnosis not present

## 2024-07-12 DIAGNOSIS — Z794 Long term (current) use of insulin: Secondary | ICD-10-CM

## 2024-07-12 DIAGNOSIS — K7460 Unspecified cirrhosis of liver: Secondary | ICD-10-CM

## 2024-07-12 LAB — BAYER DCA HB A1C WAIVED: HB A1C (BAYER DCA - WAIVED): 8 % — ABNORMAL HIGH (ref 4.8–5.6)

## 2024-07-12 MED ORDER — OZEMPIC (0.25 OR 0.5 MG/DOSE) 2 MG/3ML ~~LOC~~ SOPN: 0.5000 mg | PEN_INJECTOR | SUBCUTANEOUS | 1 refills | Status: AC

## 2024-07-12 NOTE — Patient Instructions (Signed)
 Health Maintenance, Male  Adopting a healthy lifestyle and getting preventive care are important in promoting health and wellness. Ask your health care provider about:  The right schedule for you to have regular tests and exams.  Things you can do on your own to prevent diseases and keep yourself healthy.  What should I know about diet, weight, and exercise?  Eat a healthy diet    Eat a diet that includes plenty of vegetables, fruits, low-fat dairy products, and lean protein.  Do not eat a lot of foods that are high in solid fats, added sugars, or sodium.  Maintain a healthy weight  Body mass index (BMI) is a measurement that can be used to identify possible weight problems. It estimates body fat based on height and weight. Your health care provider can help determine your BMI and help you achieve or maintain a healthy weight.  Get regular exercise  Get regular exercise. This is one of the most important things you can do for your health. Most adults should:  Exercise for at least 150 minutes each week. The exercise should increase your heart rate and make you sweat (moderate-intensity exercise).  Do strengthening exercises at least twice a week. This is in addition to the moderate-intensity exercise.  Spend less time sitting. Even light physical activity can be beneficial.  Watch cholesterol and blood lipids  Have your blood tested for lipids and cholesterol at 64 years of age, then have this test every 5 years.  You may need to have your cholesterol levels checked more often if:  Your lipid or cholesterol levels are high.  You are older than 64 years of age.  You are at high risk for heart disease.  What should I know about cancer screening?  Many types of cancers can be detected early and may often be prevented. Depending on your health history and family history, you may need to have cancer screening at various ages. This may include screening for:  Colorectal cancer.  Prostate cancer.  Skin cancer.  Lung  cancer.  What should I know about heart disease, diabetes, and high blood pressure?  Blood pressure and heart disease  High blood pressure causes heart disease and increases the risk of stroke. This is more likely to develop in people who have high blood pressure readings or are overweight.  Talk with your health care provider about your target blood pressure readings.  Have your blood pressure checked:  Every 3-5 years if you are 24-52 years of age.  Every year if you are 3 years old or older.  If you are between the ages of 60 and 72 and are a current or former smoker, ask your health care provider if you should have a one-time screening for abdominal aortic aneurysm (AAA).  Diabetes  Have regular diabetes screenings. This checks your fasting blood sugar level. Have the screening done:  Once every three years after age 66 if you are at a normal weight and have a low risk for diabetes.  More often and at a younger age if you are overweight or have a high risk for diabetes.  What should I know about preventing infection?  Hepatitis B  If you have a higher risk for hepatitis B, you should be screened for this virus. Talk with your health care provider to find out if you are at risk for hepatitis B infection.  Hepatitis C  Blood testing is recommended for:  Everyone born from [birth year] through 1966.  Anyone  with known risk factors for hepatitis C.  Sexually transmitted infections (STIs)  You should be screened each year for STIs, including gonorrhea and chlamydia, if:  You are sexually active and are younger than 64 years of age.  You are older than 64 years of age and your health care provider tells you that you are at risk for this type of infection.  Your sexual activity has changed since you were last screened, and you are at increased risk for chlamydia or gonorrhea. Ask your health care provider if you are at risk.  Ask your health care provider about whether you are at high risk for HIV. Your health care provider  may recommend a prescription medicine to help prevent HIV infection. If you choose to take medicine to prevent HIV, you should first get tested for HIV. You should then be tested every 3 months for as long as you are taking the medicine.  Follow these instructions at home:  Alcohol use  Do not drink alcohol if your health care provider tells you not to drink.  If you drink alcohol:  Limit how much you have to 0-2 drinks a day.  Know how much alcohol is in your drink. In the [place] one drink equals one 12 oz bottle of beer (355 mL), one 5 oz glass of wine (148 mL), or one 1 oz glass of hard liquor (44 mL).  Lifestyle  Do not use any products that contain nicotine or tobacco. These products include cigarettes, chewing tobacco, and vaping devices, such as e-cigarettes. If you need help quitting, ask your health care provider.  Do not use street drugs.  Do not share needles.  Ask your health care provider for help if you need support or information about quitting drugs.  General instructions  Schedule regular health, dental, and eye exams.  Stay current with your vaccines.  Tell your health care provider if:  You often feel depressed.  You have ever been abused or do not feel safe at home.  Summary  Adopting a healthy lifestyle and getting preventive care are important in promoting health and wellness.  Follow your health care provider's instructions about healthy diet, exercising, and getting tested or screened for diseases.  Follow your health care provider's instructions on monitoring your cholesterol and blood pressure.  This information is not intended to replace advice given to you by your health care provider. Make sure you discuss any questions you have with your health care provider.  Document Revised: 11/27/2020 Document Reviewed: 11/27/2020  Elsevier Patient Education  2024 [employer].

## 2024-07-12 NOTE — Progress Notes (Signed)
 "  Subjective:    Patient ID: Thomas Pratt, male    DOB: 20-Feb-1961, 63 y.o.   MRN: [id number]  Chief Complaint  Patient presents with   Medical Management of Chronic Issues    Body went numb 3-4 days ago    PT presents to the office today for chronic follow up. He is followed by our clinical pharmacists  for DM education.   He is followed by GI  for cirrhosis of liver. He has been sober since 03/2016. He had a colonoscopy and EGD on 04/23/22.   He is complaining of bilateral numbing pain in his foot and leg that is 9 out 10 when walking. Has diabetic neuropathy and takes gabapentin .   He is currently taking Ozempic  0.25 mg. Doing well and denies any nausea or vomiting.      07/12/2024    1:58 PM 06/23/2024    9:45 AM 05/26/2024    9:41 AM  Last 3 Weights  Weight (lbs) 165 lb 163 lb 168 lb 4.8 oz  Weight (kg) 74.844 kg 73.936 kg 76.34 kg     Hypertension This is a chronic problem. The current episode started more than 1 year ago. The problem has been resolved since onset. The problem is controlled. Associated symptoms include blurred vision and malaise/fatigue (some times). Pertinent negatives include no peripheral edema (some times), PND or shortness of breath. Risk factors for coronary artery disease include diabetes mellitus, dyslipidemia, male gender and sedentary lifestyle. The current treatment provides moderate improvement.  Gastroesophageal Reflux This is a chronic problem. The current episode started more than 1 year ago. The problem occurs rarely. The symptoms are aggravated by certain foods. Associated symptoms include fatigue. He has tried a PPI for the symptoms. The treatment provided moderate relief.  Hyperlipidemia This is a chronic problem. The current episode started more than 1 year ago. The problem is controlled. Recent lipid tests were reviewed and are normal. Pertinent negatives include no shortness of breath. Current antihyperlipidemic treatment includes  statins. The current treatment provides moderate improvement of lipids. Risk factors for coronary artery disease include dyslipidemia, diabetes mellitus, hypertension, a sedentary lifestyle and male sex.  Diabetes He presents for his follow-up diabetic visit. He has type 2 diabetes mellitus. Associated symptoms include blurred vision, fatigue and foot paresthesias. Diabetic complications include peripheral neuropathy. Risk factors for coronary artery disease include dyslipidemia, diabetes mellitus, male sex, hypertension and sedentary lifestyle. He is following a generally healthy diet. His overall blood glucose range is 140-180 mg/dl. Eye exam is current.  Depression        This is a chronic problem.  The current episode started more than 1 year ago.   The onset quality is gradual.   Associated symptoms include fatigue, helplessness (some times) and hopelessness.  Associated symptoms include not sad.  Past treatments include SSRIs - Selective serotonin reuptake inhibitors.     Review of Systems  Constitutional:  Positive for fatigue and malaise/fatigue (some times).  Eyes:  Positive for blurred vision.  Respiratory:  Negative for shortness of breath.   Cardiovascular:  Negative for PND.  All other systems reviewed and are negative.  Family History  Problem Relation Age of Onset   COPD Mother    Liver disease Neg Hx    Colon cancer Neg Hx    Colon polyps Neg Hx    Social History   Socioeconomic History   Marital status: Divorced    Spouse name: Not on file   Number of children:  Not on file   Years of education: Not on file   Highest education level: 11th grade  Occupational History   Occupation: unemployed    Comment: disability  Tobacco Use   Smoking status: Never   Smokeless tobacco: Never  Vaping Use   Vaping status: Never Used  Substance and Sexual Activity   Alcohol use: No    Comment: Quit September 2017, heavy alcohol abuse previously   Drug use: No   Sexual  activity: Not Currently    Partners: Female  Other Topics Concern   Not on file  Social History Narrative   Lives in [place], [place] with his youngest daughter; Is a retired education administrator. Is on disability for cirrhosis.    Drank alcohol for years, quit 05/07/16. Has been sober since then. Quit b/c of health/cirrhosis.   Divorced for over 15 years.   Dating sporadically.   Has 3 daughters and 1 son live locally.   Originally from [place] , moved here b/c of wife.    Eats all foods.    Wear seatbelt.   Does not drive, does not have a license, lost due to DWI.    Brought by neighbor.    Does not go to church. Believes in God. Prays each night, per report.    Social Drivers of Health   Tobacco Use: Low Risk (07/12/2024)   Patient History    Smoking Tobacco Use: Never    Smokeless Tobacco Use: Never    Passive Exposure: Not on file  Financial Resource Strain: Low Risk (07/08/2024)   Overall Financial Resource Strain (CARDIA)    Difficulty of Paying Living Expenses: Not very hard  Food Insecurity: Food Insecurity Present (07/08/2024)   Epic    Worried About Programme Researcher, Broadcasting/film/video in the Last Year: Often true    Ran Out of Food in the Last Year: Sometimes true  Transportation Needs: Unmet Transportation Needs (07/08/2024)   Epic    Lack of Transportation (Medical): Not on file    Lack of Transportation (Non-Medical): Yes  Physical Activity: Sufficiently Active (11/20/2022)   Exercise Vital Sign    Days of Exercise per Week: 5 days    Minutes of Exercise per Session: 30 min  Stress: No Stress Concern Present (11/20/2022)   [employer] of Occupational Health - Occupational Stress Questionnaire    Feeling of Stress : Not at all  Social Connections: Socially Isolated (07/08/2024)   Social Connection and Isolation Panel    Frequency of Communication with Friends and Family: Once a week    Frequency of Social Gatherings with Friends and Family: Once a week    Attends Religious Services: 1 to 4  times per year    Active Member of Clubs or Organizations: No    Attends Engineer, Structural: Not on file    Marital Status: Divorced  Depression (PHQ2-9): Low Risk (07/12/2024)   Depression (PHQ2-9)    PHQ-2 Score: 0  Alcohol Screen: Low Risk (11/20/2022)   Alcohol Screen    Last Alcohol Screening Score (AUDIT): 0  Housing: High Risk (07/08/2024)   Epic    Unable to Pay for Housing in the Last Year: Yes    Number of Times Moved in the Last Year: 3    Homeless in the Last Year: No  Utilities: Not At Risk (11/20/2022)   AHC Utilities    Threatened with loss of utilities: No  Health Literacy: Not on file       Objective:   Physical  Exam Vitals reviewed.  Constitutional:      General: He is not in acute distress.    Appearance: He is well-developed.  HENT:     Head: Normocephalic.     Right Ear: Tympanic membrane normal.     Left Ear: Tympanic membrane normal.  Eyes:     General:        Right eye: No discharge.        Left eye: No discharge.     Pupils: Pupils are equal, round, and reactive to light.  Neck:     Thyroid : No thyromegaly.  Cardiovascular:     Rate and Rhythm: Normal rate and regular rhythm.     Heart sounds: Normal heart sounds. No murmur heard. Pulmonary:     Effort: Pulmonary effort is normal. No respiratory distress.     Breath sounds: Normal breath sounds. No wheezing.  Abdominal:     General: Bowel sounds are normal. There is no distension.     Palpations: Abdomen is soft.     Tenderness: There is no abdominal tenderness (no tenderness noted).  Musculoskeletal:        General: No tenderness. Normal range of motion.     Cervical back: Normal range of motion and neck supple.  Skin:    General: Skin is warm and dry.     Findings: No erythema or rash.  Neurological:     Mental Status: He is alert and oriented to person, place, and time.     Cranial Nerves: No cranial nerve deficit.     Deep Tendon Reflexes: Reflexes are normal and symmetric.   Psychiatric:        Behavior: Behavior normal.        Thought Content: Thought content normal.        Judgment: Judgment normal.     BP 130/62   Pulse 65   Temp 98.1 F (36.7 C) (Temporal)   Ht 5' 8 (1.727 m)   Wt 165 lb (74.8 kg)   SpO2 99%   BMI 25.09 kg/m        Assessment & Plan:  Yogesh Cominsky comes in today with chief complaint of Medical Management of Chronic Issues (Body went numb 3-4 days ago )   Diagnosis and orders addressed:  1. Encounter for immunization - Flu vaccine trivalent PF, 6mos and older(Flulaval,Afluria,Fluarix,Fluzone)  2. Type 2 diabetes mellitus with diabetic polyneuropathy, with long-term current use of insulin  (HCC) (Primary) Will increase Ozempic  to 0.5 mg from 0.25 mg  - Semaglutide ,0.25 or 0.5MG /DOS, (OZEMPIC , 0.25 OR 0.5 MG/DOSE,) 2 MG/3ML SOPN; Inject 0.5 mg into the skin once a week. E11.42  Dispense: 3 mL; Refill: 1 - CMP14+EGFR - Bayer DCA Hb A1c Waived - Microalbumin / creatinine urine ratio  3. Alcoholic cirrhosis of liver without ascites (HCC) - CMP14+EGFR  4. Cirrhosis of liver without ascites, unspecified hepatic cirrhosis type (HCC) - CMP14+EGFR  5. Depression, major, single episode, mild - CMP14+EGFR  6. Gastroesophageal reflux disease, unspecified whether esophagitis present  - CMP14+EGFR  7. Hyperlipidemia associated with type 2 diabetes mellitus (HCC) - CMP14+EGFR  8. Primary hypertension - CMP14+EGFR   Labs pending Will increase Ozempic  to 0.5 mg from 0.25 mg  Continue current medications  Health Maintenance reviewed Diet and exercise encouraged  Follow up plan: 4 months   Bari Learn, FNP   "

## 2024-07-13 ENCOUNTER — Ambulatory Visit: Payer: Self-pay | Admitting: Family

## 2024-07-13 LAB — CMP14+EGFR
ALT: 27 IU/L (ref 0–44)
AST: 18 IU/L (ref 0–40)
Albumin: 4.4 g/dL (ref 3.9–4.9)
Alkaline Phosphatase: 75 IU/L (ref 47–123)
BUN/Creatinine Ratio: 13 (ref 10–24)
BUN: 13 mg/dL (ref 8–27)
Bilirubin Total: 0.6 mg/dL (ref 0.0–1.2)
CO2: 25 mmol/L (ref 20–29)
Calcium: 10.3 mg/dL — ABNORMAL HIGH (ref 8.6–10.2)
Chloride: 94 mmol/L — ABNORMAL LOW (ref 96–106)
Creatinine, Ser: 0.98 mg/dL (ref 0.76–1.27)
Globulin, Total: 2.9 g/dL (ref 1.5–4.5)
Glucose: 290 mg/dL — ABNORMAL HIGH (ref 70–99)
Potassium: 4.5 mmol/L (ref 3.5–5.2)
Sodium: 135 mmol/L (ref 134–144)
Total Protein: 7.3 g/dL (ref 6.0–8.5)
eGFR: 86 mL/min/1.73

## 2024-07-13 LAB — MICROALBUMIN / CREATININE URINE RATIO
Creatinine, Urine: 47.4 mg/dL
Microalb/Creat Ratio: <6 mg/g{creat} (ref 0–29)
Microalbumin, Urine: <3 ug/mL

## 2024-07-18 ENCOUNTER — Other Ambulatory Visit: Payer: Self-pay | Admitting: Family

## 2024-07-18 DIAGNOSIS — K219 Gastro-esophageal reflux disease without esophagitis: Secondary | ICD-10-CM

## 2024-07-22 ENCOUNTER — Encounter: Payer: Self-pay | Admitting: Gastroenterology

## 2024-07-23 ENCOUNTER — Telehealth: Payer: Self-pay

## 2024-07-23 NOTE — Telephone Encounter (Signed)
 Copied from CRM #[id number]. Topic: General - Other >> Jul 23, 2024 11:39 AM Selinda RAMAN wrote: Reason for CRM: The patient called in stating his insurance changed as of yesterday January 1st. He now has [employer] and was told by his insurance that his provider needs to send some type of correspondence indicating he has Diabetes 2. This is necessary so he can get a card to help him with benefits. If this can be sent to the attention of Rollo Leghorn with Customer or Member Services at phone number [phone]. Please assist patient further and if any questions please contact him.

## 2024-07-23 NOTE — Telephone Encounter (Signed)
 This is a fax that comes from [facility] and once they send it we will fill out.

## 2024-08-14 ENCOUNTER — Other Ambulatory Visit: Payer: Self-pay | Admitting: Family

## 2024-08-14 DIAGNOSIS — F320 Major depressive disorder, single episode, mild: Secondary | ICD-10-CM

## 2024-08-17 ENCOUNTER — Other Ambulatory Visit (HOSPITAL_COMMUNITY): Payer: Self-pay

## 2024-08-17 ENCOUNTER — Other Ambulatory Visit: Payer: Self-pay | Admitting: Family

## 2024-08-17 DIAGNOSIS — E1169 Type 2 diabetes mellitus with other specified complication: Secondary | ICD-10-CM

## 2024-08-20 NOTE — Progress Notes (Signed)
 Thomas Pratt                                          MRN: [id number]   08/20/2024   The [facility] Quality Team Specialist reviewed this patient medical record for the purposes of chart review for care gap closure. The following were reviewed: abstraction for care gap closure-glycemic status assessment.    [facility] Quality Team

## 2024-08-25 ENCOUNTER — Telehealth: Payer: Self-pay | Admitting: Family

## 2024-08-25 NOTE — Telephone Encounter (Unsigned)
 Copied from CRM #[id number]. Topic: General - Other >> Aug 25, 2024 11:54 AM Graeme ORN wrote: Reason for CRM: [facility] stated [facility] but is switching back to [facility] 3/1. They have requested provider confirm again that patient has chronic condition. Thank You    ----------------------------------------------------------------------- From previous Reason for Contact - Appt Info/Confirm: Patient/patient representative is calling for information regarding an appointment.

## 2024-08-26 NOTE — Telephone Encounter (Signed)
 Form received and on PCP's desk

## 2024-09-24 ENCOUNTER — Ambulatory Visit: Admitting: Family

## 2024-10-07 ENCOUNTER — Ambulatory Visit: Admitting: Gastroenterology

## 2024-11-26 ENCOUNTER — Ambulatory Visit: Payer: Self-pay
